# Patient Record
Sex: Female | Born: 2000
Health system: Southern US, Community
[De-identification: ages and names within clinical notes are randomized; demographics above are authoritative.]

## PROBLEM LIST (undated history)

## (undated) DIAGNOSIS — F320 Major depressive disorder, single episode, mild: Secondary | ICD-10-CM

## (undated) DIAGNOSIS — L0292 Furuncle, unspecified: Secondary | ICD-10-CM

## (undated) DIAGNOSIS — J45909 Unspecified asthma, uncomplicated: Secondary | ICD-10-CM

## (undated) DIAGNOSIS — E049 Nontoxic goiter, unspecified: Secondary | ICD-10-CM

## (undated) DIAGNOSIS — T7422XA Child sexual abuse, confirmed, initial encounter: Secondary | ICD-10-CM

## (undated) DIAGNOSIS — E109 Type 1 diabetes mellitus without complications: Secondary | ICD-10-CM

## (undated) DIAGNOSIS — F909 Attention-deficit hyperactivity disorder, unspecified type: Secondary | ICD-10-CM

## (undated) DIAGNOSIS — T7840XA Allergy, unspecified, initial encounter: Secondary | ICD-10-CM

## (undated) DIAGNOSIS — E1010 Type 1 diabetes mellitus with ketoacidosis without coma: Secondary | ICD-10-CM

## (undated) DIAGNOSIS — E1110 Type 2 diabetes mellitus with ketoacidosis without coma: Secondary | ICD-10-CM

## (undated) HISTORY — DX: Type 1 diabetes mellitus with ketoacidosis without coma: E10.10

## (undated) HISTORY — DX: Child sexual abuse, confirmed, initial encounter: T74.22XA

## (undated) HISTORY — DX: Type 1 diabetes mellitus without complications: E10.9

## (undated) HISTORY — DX: Major depressive disorder, single episode, mild: F32.0

## (undated) NOTE — *Deleted (*Deleted)
Thank you for coming to see me today. It was a pleasure. Today we talked about:   Continue to follow up with Dr Fransico Michael for your Diabetes  Please follow-up with PCP as needed  If you have any questions or concerns, please do not hesitate to call the office at [phone].  Best,  Dawn Allan, MD Family Medicine Residency   Outpatient Mental Health Providers (No Insurance required or Self Pay)  [facility]  [address] [place], [place] [phone] Crisis [phone]  [facility]) can see uninsured folks for outpatient therapy [email or web address] [address] [place], [place] [address] [phone]  [facility]    Walk-in Mon-Fri, 8am-3pm [email or web address] [email or web address] [address] [place], [place]  [phone]   [address]  [place] [phone] [facility] for psych med management, there may be a wait- if [facility] is working with clients for OPT, they will coordinate with [facility] for psych  [facility]   Walk-in-Clinic: Monday- Friday 9:00 AM - 4:00 PM [address]   [place], [place] [phone]  [facility] [place] ([employer]) walk in M-F 8am-12pm and  1pm-3pm [place]- [address]     [phone]  [employer] [address]  Phone: [phone]  [employer] (Mental Health and substance challenges) [address]   [place] [phone]    [email or web address]    [facility]  [facility] -[address], [place]     Phone:  [phone] [place]-  [address]  [phone]   [facility]  [address] [place]  [phone] [email or web address]   [facility] ( virtual or zoom therapy) [email or web address]  [address] [place]  [phone]    [facility] [phone]  grief counseling,  dementia and caregiver support    [facility] Walk-in MWF 12:30 to 3:00     [address] [place] [address]  [phone]  [email or web address] call to schedule an appointment    [facility] Classes ,Support group, Peer support services, [address], [place], [place] [address] [phone]  PhotoSolver.[email or web address]           [employer] on Mental Illness (NAMI) [place]- Wellness classes, Support groups        [address], [place], [place] [address] [phone]   [email or web address]   [facility]  (Psycho-social Rehabilitation clubhouse, Individual and group therapy) [address] [place], [place] [address]   [phone]  24- Hour Availability:  [facility] [phone] or [phone] * [facility] [employer] (Domestic Violence, Rape, etc. )9527488881 Vesta Mixer [phone] or [phone] * [facility] [phone] only) [phone] (after hours) *[facility] [phone] *[place] National Suicide Hotline [phone] Len Childs)

---
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
## 2021-01-03 ENCOUNTER — Other Ambulatory Visit: Payer: Self-pay

## 2021-01-03 ENCOUNTER — Inpatient Hospital Stay (HOSPITAL_COMMUNITY)
Admission: EM | Admit: 2021-01-03 | Discharge: 2021-01-05 | DRG: 637 | Disposition: A | Discharge status: Home / Self Care | Payer: Medicaid Other | Attending: Family Medicine | Admitting: Family Medicine

## 2021-01-03 ENCOUNTER — Encounter (HOSPITAL_COMMUNITY): Payer: Self-pay | Admitting: Emergency Medicine

## 2021-01-03 DIAGNOSIS — N179 Acute kidney failure, unspecified: Secondary | ICD-10-CM | POA: Diagnosis not present

## 2021-01-03 DIAGNOSIS — R680 Hypothermia, not associated with low environmental temperature: Secondary | ICD-10-CM | POA: Diagnosis present

## 2021-01-03 DIAGNOSIS — T383X6A Underdosing of insulin and oral hypoglycemic [antidiabetic] drugs, initial encounter: Secondary | ICD-10-CM | POA: Diagnosis present

## 2021-01-03 DIAGNOSIS — E861 Hypovolemia: Secondary | ICD-10-CM | POA: Diagnosis present

## 2021-01-03 DIAGNOSIS — Z9114 Patient's other noncompliance with medication regimen: Secondary | ICD-10-CM

## 2021-01-03 DIAGNOSIS — E1010 Type 1 diabetes mellitus with ketoacidosis without coma: Secondary | ICD-10-CM | POA: Diagnosis not present

## 2021-01-03 DIAGNOSIS — Z91128 Patient's intentional underdosing of medication regimen for other reason: Secondary | ICD-10-CM

## 2021-01-03 DIAGNOSIS — Z20822 Contact with and (suspected) exposure to covid-19: Secondary | ICD-10-CM | POA: Diagnosis present

## 2021-01-03 DIAGNOSIS — K219 Gastro-esophageal reflux disease without esophagitis: Secondary | ICD-10-CM | POA: Diagnosis present

## 2021-01-03 DIAGNOSIS — E1110 Type 2 diabetes mellitus with ketoacidosis without coma: Secondary | ICD-10-CM | POA: Diagnosis present

## 2021-01-03 DIAGNOSIS — F329 Major depressive disorder, single episode, unspecified: Secondary | ICD-10-CM | POA: Diagnosis present

## 2021-01-03 DIAGNOSIS — Z825 Family history of asthma and other chronic lower respiratory diseases: Secondary | ICD-10-CM

## 2021-01-03 DIAGNOSIS — R112 Nausea with vomiting, unspecified: Secondary | ICD-10-CM | POA: Diagnosis present

## 2021-01-03 DIAGNOSIS — G9341 Metabolic encephalopathy: Secondary | ICD-10-CM | POA: Diagnosis present

## 2021-01-03 DIAGNOSIS — Z833 Family history of diabetes mellitus: Secondary | ICD-10-CM

## 2021-01-03 DIAGNOSIS — Z794 Long term (current) use of insulin: Secondary | ICD-10-CM

## 2021-01-03 DIAGNOSIS — Z91013 Allergy to seafood: Secondary | ICD-10-CM

## 2021-01-03 DIAGNOSIS — F909 Attention-deficit hyperactivity disorder, unspecified type: Secondary | ICD-10-CM | POA: Diagnosis present

## 2021-01-03 DIAGNOSIS — Z91199 Patient's noncompliance with other medical treatment and regimen due to unspecified reason: Secondary | ICD-10-CM

## 2021-01-03 DIAGNOSIS — J45909 Unspecified asthma, uncomplicated: Secondary | ICD-10-CM | POA: Diagnosis not present

## 2021-01-03 DIAGNOSIS — E860 Dehydration: Secondary | ICD-10-CM | POA: Diagnosis present

## 2021-01-03 DIAGNOSIS — Z79899 Other long term (current) drug therapy: Secondary | ICD-10-CM

## 2021-01-03 LAB — BASIC METABOLIC PANEL
Anion gap: 19 — ABNORMAL HIGH (ref 5–15)
Anion gap: 16 — ABNORMAL HIGH (ref 5–15)
BUN: 21 mg/dL — ABNORMAL HIGH (ref 6–20)
BUN: 21 mg/dL — ABNORMAL HIGH (ref 6–20)
BUN: 17 mg/dL (ref 6–20)
BUN: 14 mg/dL (ref 6–20)
BUN: 9 mg/dL (ref 6–20)
CO2: <7 mmol/L — ABNORMAL LOW (ref 22–32)
CO2: <7 mmol/L — ABNORMAL LOW (ref 22–32)
CO2: <7 mmol/L — ABNORMAL LOW (ref 22–32)
CO2: 7 mmol/L — ABNORMAL LOW (ref 22–32)
CO2: 10 mmol/L — ABNORMAL LOW (ref 22–32)
Calcium: 9.6 mg/dL (ref 8.9–10.3)
Calcium: 8.5 mg/dL — ABNORMAL LOW (ref 8.9–10.3)
Calcium: 9 mg/dL (ref 8.9–10.3)
Calcium: 8.9 mg/dL (ref 8.9–10.3)
Calcium: 8.7 mg/dL — ABNORMAL LOW (ref 8.9–10.3)
Chloride: 102 mmol/L (ref 98–111)
Chloride: 110 mmol/L (ref 98–111)
Chloride: 115 mmol/L — ABNORMAL HIGH (ref 98–111)
Chloride: 117 mmol/L — ABNORMAL HIGH (ref 98–111)
Chloride: 114 mmol/L — ABNORMAL HIGH (ref 98–111)
Creatinine, Ser: 1.55 mg/dL — ABNORMAL HIGH (ref 0.44–1.00)
Creatinine, Ser: 1.51 mg/dL — ABNORMAL HIGH (ref 0.44–1.00)
Creatinine, Ser: 1.44 mg/dL — ABNORMAL HIGH (ref 0.44–1.00)
Creatinine, Ser: 1.28 mg/dL — ABNORMAL HIGH (ref 0.44–1.00)
Creatinine, Ser: 1.06 mg/dL — ABNORMAL HIGH (ref 0.44–1.00)
GFR, Estimated: 49 mL/min — ABNORMAL LOW (ref >60)
GFR, Estimated: 51 mL/min — ABNORMAL LOW (ref >60)
GFR, Estimated: 54 mL/min — ABNORMAL LOW (ref >60)
GFR, Estimated: >60 mL/min (ref >60)
GFR, Estimated: >60 mL/min (ref >60)
Glucose, Bld: 708 mg/dL (ref 70–99)
Glucose, Bld: 436 mg/dL — ABNORMAL HIGH (ref 70–99)
Glucose, Bld: 178 mg/dL — ABNORMAL HIGH (ref 70–99)
Glucose, Bld: 134 mg/dL — ABNORMAL HIGH (ref 70–99)
Glucose, Bld: 168 mg/dL — ABNORMAL HIGH (ref 70–99)
Potassium: 5.1 mmol/L (ref 3.5–5.1)
Potassium: 5.9 mmol/L — ABNORMAL HIGH (ref 3.5–5.1)
Potassium: 4.4 mmol/L (ref 3.5–5.1)
Potassium: 4.2 mmol/L (ref 3.5–5.1)
Potassium: 3.6 mmol/L (ref 3.5–5.1)
Sodium: 136 mmol/L (ref 135–145)
Sodium: 138 mmol/L (ref 135–145)
Sodium: 143 mmol/L (ref 135–145)
Sodium: 143 mmol/L (ref 135–145)
Sodium: 140 mmol/L (ref 135–145)

## 2021-01-03 LAB — URINALYSIS, ROUTINE W REFLEX MICROSCOPIC
Bilirubin Urine: NEGATIVE
Glucose, UA: >=500 mg/dL — AB
Hgb urine dipstick: NEGATIVE
Ketones, ur: 80 mg/dL — AB
Leukocytes,Ua: NEGATIVE
Nitrite: NEGATIVE
Protein, ur: NEGATIVE mg/dL
Specific Gravity, Urine: 1.016 (ref 1.005–1.030)
pH: 5 (ref 5.0–8.0)

## 2021-01-03 LAB — I-STAT VENOUS BLOOD GAS, ED
Acid-base deficit: 29 mmol/L — ABNORMAL HIGH (ref 0.0–2.0)
Bicarbonate: 3.8 mmol/L — ABNORMAL LOW (ref 20.0–28.0)
Calcium, Ion: 1.29 mmol/L (ref 1.15–1.40)
HCT: 45 % (ref 36.0–46.0)
Hemoglobin: 15.3 g/dL — ABNORMAL HIGH (ref 12.0–15.0)
O2 Saturation: 57 %
Patient temperature: 37
Potassium: 5 mmol/L (ref 3.5–5.1)
Sodium: 137 mmol/L (ref 135–145)
TCO2: <5 mmol/L — ABNORMAL LOW (ref 22–32)
pCO2, Ven: 20.5 mmHg — ABNORMAL LOW (ref 44.0–60.0)
pH, Ven: 6.879 — CL (ref 7.250–7.430)
pO2, Ven: 50 mmHg — ABNORMAL HIGH (ref 32.0–45.0)

## 2021-01-03 LAB — RAPID URINE DRUG SCREEN, HOSP PERFORMED
Amphetamines: NOT DETECTED
Barbiturates: NOT DETECTED
Benzodiazepines: NOT DETECTED
Cocaine: NOT DETECTED
Opiates: NOT DETECTED
Tetrahydrocannabinol: NOT DETECTED

## 2021-01-03 LAB — CBG MONITORING, ED
Glucose-Capillary: >600 mg/dL (ref 70–99)
Glucose-Capillary: 512 mg/dL (ref 70–99)
Glucose-Capillary: 462 mg/dL — ABNORMAL HIGH (ref 70–99)
Glucose-Capillary: 366 mg/dL — ABNORMAL HIGH (ref 70–99)
Glucose-Capillary: 236 mg/dL — ABNORMAL HIGH (ref 70–99)
Glucose-Capillary: 191 mg/dL — ABNORMAL HIGH (ref 70–99)
Glucose-Capillary: 384 mg/dL — ABNORMAL HIGH (ref 70–99)
Glucose-Capillary: 84 mg/dL (ref 70–99)
Glucose-Capillary: 118 mg/dL — ABNORMAL HIGH (ref 70–99)
Glucose-Capillary: 106 mg/dL — ABNORMAL HIGH (ref 70–99)
Glucose-Capillary: 173 mg/dL — ABNORMAL HIGH (ref 70–99)
Glucose-Capillary: 193 mg/dL — ABNORMAL HIGH (ref 70–99)
Glucose-Capillary: 191 mg/dL — ABNORMAL HIGH (ref 70–99)
Glucose-Capillary: 189 mg/dL — ABNORMAL HIGH (ref 70–99)
Glucose-Capillary: 174 mg/dL — ABNORMAL HIGH (ref 70–99)
Glucose-Capillary: 244 mg/dL — ABNORMAL HIGH (ref 70–99)
Glucose-Capillary: 252 mg/dL — ABNORMAL HIGH (ref 70–99)
Glucose-Capillary: 242 mg/dL — ABNORMAL HIGH (ref 70–99)
Glucose-Capillary: 177 mg/dL — ABNORMAL HIGH (ref 70–99)
Glucose-Capillary: 164 mg/dL — ABNORMAL HIGH (ref 70–99)
Glucose-Capillary: 144 mg/dL — ABNORMAL HIGH (ref 70–99)

## 2021-01-03 LAB — CBC WITH DIFFERENTIAL/PLATELET
Abs Immature Granulocytes: 0.13 10*3/uL — ABNORMAL HIGH (ref 0.00–0.07)
Basophils Absolute: 0.1 10*3/uL (ref 0.0–0.1)
Basophils Relative: 1 %
Eosinophils Absolute: 0 10*3/uL (ref 0.0–0.5)
Eosinophils Relative: 0 %
HCT: 46.5 % — ABNORMAL HIGH (ref 36.0–46.0)
Hemoglobin: 13.1 g/dL (ref 12.0–15.0)
Immature Granulocytes: 1 %
Lymphocytes Relative: 22 %
Lymphs Abs: 3.2 10*3/uL (ref 0.7–4.0)
MCH: 28.6 pg (ref 26.0–34.0)
MCHC: 28.2 g/dL — ABNORMAL LOW (ref 30.0–36.0)
MCV: 101.5 fL — ABNORMAL HIGH (ref 80.0–100.0)
Monocytes Absolute: 0.6 10*3/uL (ref 0.1–1.0)
Monocytes Relative: 4 %
Neutro Abs: 10.4 10*3/uL — ABNORMAL HIGH (ref 1.7–7.7)
Neutrophils Relative %: 72 %
Platelets: 274 10*3/uL (ref 150–400)
RBC: 4.58 MIL/uL (ref 3.87–5.11)
RDW: 13.7 % (ref 11.5–15.5)
WBC: 14.3 10*3/uL — ABNORMAL HIGH (ref 4.0–10.5)
nRBC: 0 % (ref 0.0–0.2)

## 2021-01-03 LAB — RESP PANEL BY RT-PCR (FLU A&B, COVID) ARPGX2
Influenza A by PCR: NEGATIVE
Influenza B by PCR: NEGATIVE
SARS Coronavirus 2 by RT PCR: NEGATIVE

## 2021-01-03 LAB — ETHANOL: Alcohol, Ethyl (B): <10 mg/dL (ref <10)

## 2021-01-03 LAB — BETA-HYDROXYBUTYRIC ACID
Beta-Hydroxybutyric Acid: >8 mmol/L — ABNORMAL HIGH (ref 0.05–0.27)
Beta-Hydroxybutyric Acid: >8 mmol/L — ABNORMAL HIGH (ref 0.05–0.27)
Beta-Hydroxybutyric Acid: >8 mmol/L — ABNORMAL HIGH (ref 0.05–0.27)

## 2021-01-03 LAB — BLOOD GAS, ARTERIAL
Acid-base deficit: 27.5 mmol/L — ABNORMAL HIGH (ref 0.0–2.0)
Allens test (pass/fail): POSITIVE — AB
Bicarbonate: 2 mmol/L — ABNORMAL LOW (ref 20.0–28.0)
FIO2: 21
O2 Saturation: 97.7 %
Patient temperature: 34.4
pCO2 arterial: <19 mmHg — CL (ref 32.0–48.0)
pH, Arterial: 7.075 — CL (ref 7.350–7.450)
pO2, Arterial: <31 mmHg — CL (ref 83.0–108.0)

## 2021-01-03 LAB — AMMONIA: Ammonia: 69 umol/L — ABNORMAL HIGH (ref 9–35)

## 2021-01-03 LAB — MAGNESIUM: Magnesium: 2.6 mg/dL — ABNORMAL HIGH (ref 1.7–2.4)

## 2021-01-03 LAB — I-STAT BETA HCG BLOOD, ED (MC, WL, AP ONLY): I-stat hCG, quantitative: <5 m[IU]/mL (ref <5)

## 2021-01-03 MED ORDER — SODIUM BICARBONATE 8.4 % IV SOLN
50.0000 meq | Freq: Once | INTRAVENOUS | Status: AC
  Administered 2021-01-03: 50 meq via INTRAVENOUS
  Filled 2021-01-03: qty 50

## 2021-01-03 MED ORDER — SODIUM CHLORIDE 0.9 % IV BOLUS
1000.0000 mL | Freq: Once | INTRAVENOUS | Status: AC
Start: 2021-01-03 — End: 2021-01-03
  Administered 2021-01-03: 1000 mL via INTRAVENOUS

## 2021-01-03 MED ORDER — ALBUTEROL SULFATE (2.5 MG/3ML) 0.083% IN NEBU: 2.5000 mg | INHALATION_SOLUTION | RESPIRATORY_TRACT | Status: DC | PRN

## 2021-01-03 MED ORDER — DEXTROSE 50 % IV SOLN: 0.0000 mL | INTRAVENOUS | Status: DC | PRN

## 2021-01-03 MED ORDER — LACTATED RINGERS IV BOLUS
1000.0000 mL | Freq: Once | INTRAVENOUS | Status: AC
  Administered 2021-01-03: 1000 mL via INTRAVENOUS

## 2021-01-03 MED ORDER — INSULIN REGULAR(HUMAN) IN NACL 100-0.9 UT/100ML-% IV SOLN
INTRAVENOUS | Status: DC
  Administered 2021-01-03: 5.5 [IU]/h via INTRAVENOUS

## 2021-01-03 MED ORDER — LACTATED RINGERS IV SOLN: INTRAVENOUS | Status: DC

## 2021-01-03 MED ORDER — DEXTROSE IN LACTATED RINGERS 5 % IV SOLN: INTRAVENOUS | Status: DC

## 2021-01-03 MED ORDER — ENOXAPARIN SODIUM 30 MG/0.3ML IJ SOSY
30.0000 mg | PREFILLED_SYRINGE | INTRAMUSCULAR | Status: DC
  Filled 2021-01-03: qty 0.3

## 2021-01-03 MED ORDER — INSULIN REGULAR(HUMAN) IN NACL 100-0.9 UT/100ML-% IV SOLN
INTRAVENOUS | Status: DC
  Administered 2021-01-03: 7 [IU]/h via INTRAVENOUS
  Filled 2021-01-03: qty 100

## 2021-01-03 MED ORDER — ACETAMINOPHEN 325 MG PO TABS
650.0000 mg | ORAL_TABLET | Freq: Four times a day (QID) | ORAL | Status: DC | PRN
  Administered 2021-01-03: 650 mg via ORAL
  Filled 2021-01-03: qty 2

## 2021-01-03 NOTE — ED Notes (Signed)
Pt requesting tylenol for menstrual cramp pain. This RN paged provider per pt's request

## 2021-01-03 NOTE — ED Notes (Signed)
Pt c/o feeling cold. New warm blankets provided to pt along with heat packs for menstrual cramping. Sandwich bag and ice water also given to pt.

## 2021-01-03 NOTE — ED Provider Notes (Signed)
I provided a substantive portion of the care of this patient.  I personally performed the entirety of the history, exam, and medical decision making for this encounter.  Noncompliant. Here with hyperglycemia, tachypnea. Appears significantly ill. Likely DKA. Probably from noncompliance.     Physical Exam  BP (!) 148/110 (BP Location: Left Arm)    Resp (!) 24   Physical Exam Vitals and nursing note reviewed.  HENT:     Head: Normocephalic and atraumatic.     Mouth/Throat:     Mouth: Mucous membranes are moist.     Pharynx: Oropharynx is clear.  Eyes:     Pupils: Pupils are equal, round, and reactive to light.  Cardiovascular:     Rate and Rhythm: Tachycardia present.  Pulmonary:     Effort: Tachypnea present.  Abdominal:     General: There is no distension.     Tenderness: There is no abdominal tenderness.  Musculoskeletal:        General: No swelling or tenderness. Normal range of motion.  Skin:    General: Skin is warm and dry.  Neurological:     General: No focal deficit present.    ED Course/Procedures     .Critical Care Performed by: Marily Memos, MD Authorized by: Marily Memos, MD   Critical care provider statement:    Critical care time (minutes):  32   Critical care time was exclusive of:  Separately billable procedures and treating other patients and teaching time   Critical care was necessary to treat or prevent imminent or life-threatening deterioration of the following conditions:  Shock, circulatory failure, cardiac failure and metabolic crisis   Critical care was time spent personally by me on the following activities:  Development of treatment plan with patient or surrogate, evaluation of patient's response to treatment, examination of patient, obtaining history from patient or surrogate, review of old charts, re-evaluation of patient's condition, pulse oximetry, ordering and performing treatments and interventions and ordering and review of laboratory  studies .1-3 Lead EKG Interpretation Performed by: Marily Memos, MD Authorized by: Marily Memos, MD     Interpretation: normal     ECG rate:  105   ECG rate assessment: tachycardic     Rhythm: sinus rhythm     Ectopy: none     Conduction: normal   Angiocath insertion  Date/Time: 01/03/2021 3:28 AM Performed by: Marily Memos, MD Authorized by: Marily Memos, MD  Consent: The procedure was performed in an emergent situation. Consent given by: patient Patient understanding: patient states understanding of the procedure being performed Patient consent: the patient's understanding of the procedure matches consent given Procedure consent: procedure consent matches procedure scheduled Relevant documents: relevant documents present and verified Required items: required blood products, implants, devices, and special equipment available Patient identity confirmed: arm band Time out: Immediately prior to procedure a "time out" was called to verify the correct patient, procedure, equipment, support staff and site/side marked as required. Preparation: Patient was prepped and draped in the usual sterile fashion. Local anesthesia used: no  Anesthesia: Local anesthesia used: no  Sedation: Patient sedated: no  Patient tolerance: patient tolerated the procedure well with no immediate complications    MDM   Here with what appears to be severe DKA. IV started. Fluids started. Will start bicarb. Will likely need ICU.        Tiawanna Luchsinger, Barbara Cower, MD 01/03/21 [phone]

## 2021-01-03 NOTE — ED Notes (Signed)
Temp 93.9 F. EDP notified and Bair Hugger applied

## 2021-01-03 NOTE — Progress Notes (Signed)
FPTS Brief Progress Note  S: Patient was laying comfortably in bed.  Aside from feeling generally fatigued patient says she has no complaints. She reports her breathing is much improved. She has abdominal cramps which she attributes to menses.   O: BP 119/87    Pulse (!) 102    Temp 98.4 F (36.9 C) (Oral)    Resp 16    SpO2 100%   General: Pleasant, NAD Cardiovascular: RRR, no murmurs Respiratory: CTAB, normal work of breathing on room air Abdomen: Nondistended, no tenderness, bowel sounds present Neuro: Alert, oriented x3   A/P: DKA 2/2 for medication compliance   T1DM Last CBG is 177. On exam she is alert and has normal breathing.   Patient is eating. Currently on endo tool with plan to transition to insulin once anion Gap is closed.  Continue monitoring CBG per Endo tool  - Orders reviewed. Labs for AM ordered, which was adjusted as needed.  -Continue plan per day team  Jerre Simon, MD 01/03/2021, 9:56 PM PGY-1, [facility] Night Resident  Please page [phone] with questions.
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
## 2021-01-03 NOTE — ED Notes (Signed)
Endotool recommends transition order for insulin. Asked Network engineer to page rounding team at this time.

## 2021-01-03 NOTE — ED Provider Notes (Addendum)
° [facility] °Provider Note ° ° °CSN: [id number] °Arrival date & time: 01/03/21  0223 ° °  ° °History ° °No chief complaint on file. ° ° °Dawn [name] is a 21 y.o. female. ° °Patient here with chief complaint of hyperglycemia and confusion.  Level 5 caveat applies secondary to acuity of condition and altered mental status. ° °Reportedly, patient is type I diabetic, unknown last insulin dose. ° °The history is provided by the patient. No language interpreter was used.  ° °  ° °Home Medications °Prior to Admission medications   °Medication Sig Start Date End Date Taking? Authorizing Provider  °Accu-Chek Softclix Lancets lancets Please use to check blood sugar up to 6 times/day. 07/01/19   Walsh, Tanya, MD  °albuterol (PROVENTIL) (2.5 MG/3ML) 0.083% nebulizer solution TAKE 3 MLS BY NEBULIZER EVERY 6 HOURS AS NEEDED 11/30/20   Walsh, Tanya, MD  °Blood Glucose Monitoring Suppl (ACCU-CHEK GUIDE) w/Device KIT 1 kit by Does not apply route daily as needed. 07/01/19   Walsh, Tanya, MD  °Continuous Blood Gluc Sensor (FREESTYLE LIBRE 2 SENSOR) MISC Change sensor every 14 days. 11/07/19   Brennan, Michael J, MD  °Continuous Blood Gluc Transmit (DEXCOM G6 TRANSMITTER) MISC See admin instructions. 07/22/20   [provider]  °glucagon (GLUCAGON EMERGENCY) 1 MG injection Inject 1 mg IM for severe hypoglycemia °Patient taking differently: Inject 1 mg into the muscle once as needed (hypoglycemia). 04/03/18   Beasley, Sp[place]er, NP  °glucose blood (ACCU-CHEK GUIDE) test strip Use to check glucose 6x daily 08/17/20   Neal, Sara L, MD  °insulin glargine (LANTUS) 100 UNIT/ML injection Inject 0.28 mLs (28 Units total) into the skin at bedtime. 11/28/20   Brimage, Vondra, DO  °insulin lispro (HUMALOG KWIKPEN) 100 UNIT/ML KwikPen Inject 10 Units into the skin with breakfast, with lunch, and with evening meal. 07/13/20 01/09/21  Lynn, [name], MD  °Insulin Pen Needle (BD PEN NEEDLE NANO 2ND GEN) 32G X 4  MM MISC USE TO INJECT INSULIN 6 TIMES DAILY 08/26/19   Walsh, Tanya, MD  °Insulin Syringe-Needle U-100 (INSULIN SYRINGE .5CC/30GX1/2") 30G X 1/2" 0.5 ML MISC Use to draw up Novolog three times daily. 06/08/20   Espinoza, Alejandra, DO  °Insulin Syringe-Needle U-100 (INSULIN SYRINGE 1CC/30GX1/2") 30G X 1/2" 1 ML MISC Use to draw up Lantus daily. 06/08/20   Espinoza, Alejandra, DO  °PROAIR HFA 108 (90 Base) MCG/ACT inhaler Inhale 2 puffs into the lungs every 6 (six) hours as needed for wheezing or shortness of breath. 08/17/20   [provider]  °sertraline (ZOLOFT) 50 MG tablet Take 1.5 tablets (75 mg total) by mouth daily. 07/01/20   Walsh, Tanya, MD  °SUMAtriptan (IMITREX) 25 MG tablet Take 0.5 tablets (12.5 mg total) by mouth every 2 (two) hours as needed for migraine. May repeat in 2 hours if headache persists or recurs. 07/01/20   Walsh, Tanya, MD  °   ° °Allergies    °Shellfish allergy   ° °Review of Systems   °Review of Systems ° °Physical Exam °Updated Vital Signs °BP (!) 148/110 (BP Location: Left Arm)    Resp (!) 24  °Physical Exam °Vitals and nursing note reviewed.  °Constitutional:   °   General: She is not in acute distress. °   Appearance: She is well-developed.  °HENT:  °   Head: Normocephalic and atraumatic.  °Eyes:  °   Conjunctiva/sclera: Conjunctivae normal.  °Pulmonary:  °   Effort: No respiratory distress.  °   Comments:   Comments: Tachypnea Abdominal:     General: There is no distension.  Musculoskeletal:     Cervical back: Neck supple.     Comments: Moves all extremities  Skin:    General: Skin is warm and dry.  Neurological:     Comments: GCS 10  Psychiatric:        Mood and Affect: Mood normal.        Behavior: Behavior normal.    ED Results / Procedures / Treatments   Labs (all labs ordered are listed, but only abnormal results are displayed) Labs Reviewed  CBC WITH DIFFERENTIAL/PLATELET  BASIC METABOLIC PANEL  I-STAT VENOUS BLOOD GAS, ED  I-STAT BETA HCG BLOOD, ED ([facility], [facility], [facility]  ONLY)    EKG None  Radiology No results found.  Procedures .Critical Care Performed by: Montine Circle, PA-C Authorized by: Montine Circle, PA-C   Critical care provider statement:    Critical care time (minutes):  50   Critical care was necessary to treat or prevent imminent or life-threatening deterioration of the following conditions:  Metabolic crisis   Critical care was time spent personally by me on the following activities:  Development of treatment plan with patient or surrogate, discussions with consultants, evaluation of patient's response to treatment, examination of patient, ordering and review of laboratory studies, ordering and review of radiographic studies, ordering and performing treatments and interventions, pulse oximetry, re-evaluation of patient's condition and review of old charts    Medications Ordered in ED Medications - No data to display  ED Course/ Medical Decision Making/ A&P                           Medical Decision Making  Patient here with severe acute exacerbation of chronic illness. Hx of Type 1 DM, prior DKA, and medication non-compliance.  She presents confused and hyperglycemic with concerns for DKA and coma.  Chart review from outside family medicine clinic notes history of insulin noncompliance.  Patient put on cardiac monitor, rhythm strip reviewed by me is sinus tachycardia.  I ordered venous blood gas to assess pH, venous pH was 6.87.  Potassium is 5.  We will start lactated Ringer's for dehydration.  Glucose is greater than 600, will start insulin infusion to treat hyperglycemia.  Potassium being 5, will hold off on any runs of potassium.  Patient given warming blanket/bear hugger for hypothermia.  I discussed the case with Dr. Carson Myrtle, from intensivist service, who will come to evaluate the patient.  Patient will need admission to ICU for this illness.    4:59 AM Patient seen by ICU, recommend medicine admit.         Final Clinical Impression(s) / ED Diagnoses Final diagnoses:  Diabetic ketoacidosis without coma associated with type 1 diabetes mellitus [facility])    Rx / DC Orders ED Discharge Orders     None         Montine Circle, PA-C 01/03/21 0359    Montine Circle, PA-C 01/03/21 0500    Mesner, Corene Cornea, MD 01/03/21 [phone]

## 2021-01-03 NOTE — ED Triage Notes (Signed)
Pt BIB [facility], type 1 diabetic, unknown last time pt had insulin. Not answering questions or following commands at this time.
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
## 2021-01-03 NOTE — Progress Notes (Signed)
Provided MD with ABG results and explained to him that the lab will be running the sample once again to gain the complete values.

## 2021-01-03 NOTE — ED Notes (Signed)
Barehugger discontinued and warm blankets placed on pt at this time

## 2021-01-04 DIAGNOSIS — E1110 Type 2 diabetes mellitus with ketoacidosis without coma: Secondary | ICD-10-CM | POA: Diagnosis not present

## 2021-01-04 DIAGNOSIS — Z825 Family history of asthma and other chronic lower respiratory diseases: Secondary | ICD-10-CM | POA: Diagnosis not present

## 2021-01-04 DIAGNOSIS — R680 Hypothermia, not associated with low environmental temperature: Secondary | ICD-10-CM | POA: Diagnosis present

## 2021-01-04 DIAGNOSIS — K219 Gastro-esophageal reflux disease without esophagitis: Secondary | ICD-10-CM | POA: Diagnosis present

## 2021-01-04 DIAGNOSIS — F909 Attention-deficit hyperactivity disorder, unspecified type: Secondary | ICD-10-CM | POA: Diagnosis present

## 2021-01-04 DIAGNOSIS — Z91199 Patient's noncompliance with other medical treatment and regimen due to unspecified reason: Secondary | ICD-10-CM | POA: Diagnosis not present

## 2021-01-04 DIAGNOSIS — Z91128 Patient's intentional underdosing of medication regimen for other reason: Secondary | ICD-10-CM | POA: Diagnosis not present

## 2021-01-04 DIAGNOSIS — Z79899 Other long term (current) drug therapy: Secondary | ICD-10-CM | POA: Diagnosis not present

## 2021-01-04 DIAGNOSIS — E860 Dehydration: Secondary | ICD-10-CM | POA: Diagnosis present

## 2021-01-04 DIAGNOSIS — N179 Acute kidney failure, unspecified: Secondary | ICD-10-CM | POA: Diagnosis present

## 2021-01-04 DIAGNOSIS — Z91013 Allergy to seafood: Secondary | ICD-10-CM | POA: Diagnosis not present

## 2021-01-04 DIAGNOSIS — J45909 Unspecified asthma, uncomplicated: Secondary | ICD-10-CM | POA: Diagnosis present

## 2021-01-04 DIAGNOSIS — Z20822 Contact with and (suspected) exposure to covid-19: Secondary | ICD-10-CM | POA: Diagnosis present

## 2021-01-04 DIAGNOSIS — Z794 Long term (current) use of insulin: Secondary | ICD-10-CM | POA: Diagnosis not present

## 2021-01-04 DIAGNOSIS — G9341 Metabolic encephalopathy: Secondary | ICD-10-CM | POA: Diagnosis present

## 2021-01-04 DIAGNOSIS — Z833 Family history of diabetes mellitus: Secondary | ICD-10-CM | POA: Diagnosis not present

## 2021-01-04 DIAGNOSIS — F329 Major depressive disorder, single episode, unspecified: Secondary | ICD-10-CM | POA: Diagnosis present

## 2021-01-04 DIAGNOSIS — E861 Hypovolemia: Secondary | ICD-10-CM | POA: Diagnosis present

## 2021-01-04 DIAGNOSIS — T383X6A Underdosing of insulin and oral hypoglycemic [antidiabetic] drugs, initial encounter: Secondary | ICD-10-CM | POA: Diagnosis present

## 2021-01-04 DIAGNOSIS — E1010 Type 1 diabetes mellitus with ketoacidosis without coma: Secondary | ICD-10-CM | POA: Diagnosis present

## 2021-01-04 DIAGNOSIS — Z9114 Patient's other noncompliance with medication regimen: Secondary | ICD-10-CM | POA: Diagnosis not present

## 2021-01-04 LAB — BASIC METABOLIC PANEL
Anion gap: 13 (ref 5–15)
Anion gap: 13 (ref 5–15)
Anion gap: 16 — ABNORMAL HIGH (ref 5–15)
Anion gap: 8 (ref 5–15)
BUN: 6 mg/dL (ref 6–20)
BUN: 6 mg/dL (ref 6–20)
BUN: 7 mg/dL (ref 6–20)
BUN: <5 mg/dL — ABNORMAL LOW (ref 6–20)
CO2: 11 mmol/L — ABNORMAL LOW (ref 22–32)
CO2: 13 mmol/L — ABNORMAL LOW (ref 22–32)
CO2: 15 mmol/L — ABNORMAL LOW (ref 22–32)
CO2: 16 mmol/L — ABNORMAL LOW (ref 22–32)
Calcium: 8.5 mg/dL — ABNORMAL LOW (ref 8.9–10.3)
Calcium: 8.5 mg/dL — ABNORMAL LOW (ref 8.9–10.3)
Calcium: 8.8 mg/dL — ABNORMAL LOW (ref 8.9–10.3)
Calcium: 9.1 mg/dL (ref 8.9–10.3)
Chloride: 106 mmol/L (ref 98–111)
Chloride: 107 mmol/L (ref 98–111)
Chloride: 109 mmol/L (ref 98–111)
Chloride: 112 mmol/L — ABNORMAL HIGH (ref 98–111)
Creatinine, Ser: 0.68 mg/dL (ref 0.44–1.00)
Creatinine, Ser: 0.82 mg/dL (ref 0.44–1.00)
Creatinine, Ser: 0.83 mg/dL (ref 0.44–1.00)
Creatinine, Ser: 0.86 mg/dL (ref 0.44–1.00)
GFR, Estimated: >60 mL/min (ref >60)
GFR, Estimated: >60 mL/min (ref >60)
GFR, Estimated: >60 mL/min (ref >60)
GFR, Estimated: >60 mL/min (ref >60)
Glucose, Bld: 135 mg/dL — ABNORMAL HIGH (ref 70–99)
Glucose, Bld: 142 mg/dL — ABNORMAL HIGH (ref 70–99)
Glucose, Bld: 148 mg/dL — ABNORMAL HIGH (ref 70–99)
Glucose, Bld: 193 mg/dL — ABNORMAL HIGH (ref 70–99)
Potassium: 3.4 mmol/L — ABNORMAL LOW (ref 3.5–5.1)
Potassium: 3.5 mmol/L (ref 3.5–5.1)
Potassium: 3.5 mmol/L (ref 3.5–5.1)
Potassium: 4.1 mmol/L (ref 3.5–5.1)
Sodium: 133 mmol/L — ABNORMAL LOW (ref 135–145)
Sodium: 135 mmol/L (ref 135–145)
Sodium: 135 mmol/L (ref 135–145)
Sodium: 136 mmol/L (ref 135–145)

## 2021-01-04 LAB — CBG MONITORING, ED
Glucose-Capillary: 183 mg/dL — ABNORMAL HIGH (ref 70–99)
Glucose-Capillary: 164 mg/dL — ABNORMAL HIGH (ref 70–99)
Glucose-Capillary: 142 mg/dL — ABNORMAL HIGH (ref 70–99)
Glucose-Capillary: 199 mg/dL — ABNORMAL HIGH (ref 70–99)
Glucose-Capillary: 174 mg/dL — ABNORMAL HIGH (ref 70–99)
Glucose-Capillary: 168 mg/dL — ABNORMAL HIGH (ref 70–99)
Glucose-Capillary: 173 mg/dL — ABNORMAL HIGH (ref 70–99)
Glucose-Capillary: 151 mg/dL — ABNORMAL HIGH (ref 70–99)

## 2021-01-04 LAB — GLUCOSE, CAPILLARY
Glucose-Capillary: 126 mg/dL — ABNORMAL HIGH (ref 70–99)
Glucose-Capillary: 49 mg/dL — ABNORMAL LOW (ref 70–99)
Glucose-Capillary: 177 mg/dL — ABNORMAL HIGH (ref 70–99)
Glucose-Capillary: 233 mg/dL — ABNORMAL HIGH (ref 70–99)

## 2021-01-04 MED ORDER — INSULIN GLARGINE-YFGN 100 UNIT/ML ~~LOC~~ SOLN
20.0000 [IU] | Freq: Every day | SUBCUTANEOUS | Status: DC
  Administered 2021-01-04 – 2021-01-05 (×2): 20 [IU] via SUBCUTANEOUS
  Filled 2021-01-04 (×3): qty 0.2

## 2021-01-04 MED ORDER — POTASSIUM CHLORIDE CRYS ER 20 MEQ PO TBCR
40.0000 meq | EXTENDED_RELEASE_TABLET | Freq: Once | ORAL | Status: AC
  Administered 2021-01-04: 40 meq via ORAL
  Filled 2021-01-04: qty 2

## 2021-01-04 MED ORDER — INSULIN ASPART 100 UNIT/ML IJ SOLN
5.0000 [IU] | Freq: Three times a day (TID) | INTRAMUSCULAR | Status: DC
  Administered 2021-01-04 – 2021-01-05 (×3): 5 [IU] via SUBCUTANEOUS

## 2021-01-04 MED ORDER — SERTRALINE HCL 50 MG PO TABS
75.0000 mg | ORAL_TABLET | Freq: Every day | ORAL | Status: DC
  Administered 2021-01-04 – 2021-01-05 (×2): 75 mg via ORAL
  Filled 2021-01-04: qty 1
  Filled 2021-01-04: qty 2

## 2021-01-04 MED ORDER — INSULIN ASPART 100 UNIT/ML IJ SOLN
0.0000 [IU] | Freq: Three times a day (TID) | INTRAMUSCULAR | Status: DC
  Administered 2021-01-04 (×2): 2 [IU] via SUBCUTANEOUS
  Administered 2021-01-05: 3 [IU] via SUBCUTANEOUS

## 2021-01-04 NOTE — ED Notes (Signed)
Attempted to draw labs from IV line with no success. Will notify phlebotomist to draw labs.

## 2021-01-04 NOTE — Progress Notes (Signed)
Patient's BMP showed potassium of 3.4 and anion gap of 16. Ordered 40 meq of KCl. Will continue on endo tool and will recheck BMP in 4 hours to monitor anion gap. Plan is to transition off endo tool once anion gap closed.

## 2021-01-04 NOTE — Progress Notes (Addendum)
FPTS Brief Progress Note  S: Patient was laying comfortably in bed and watching TV upon arrival.  She reports feeling much better and has no complaint at this time.  Denies any headaches or abdominal pain. Endorses mild abdominal cramps from her menses.   O: BP 113/78    Pulse (!) 105    Temp 98.5 F (36.9 C)    Resp 16    SpO2 100%   General:Awake, well appearing, NAD HEENT: Atraumatic, MMM, No sclera icterus CV: RRR, no murmurs, normal S1/S2 Pulm: CTAB, good WOB on RA Abd: Soft, no distension, no tenderness Ext: No BLE edema, +2 Pedal and radial pulse.    A/P: DKA 2/2 poor medication adherence  uncontrolled T1DM Last CBG is 233. -Continue Semglee 20 units/day team -Continue NovoLog 5 units after meals -Continue sSSI  - Orders reviewed. Labs for AM ordered, which was adjusted as needed.  -Continue plan per day team   Jerre Simon, MD 01/04/2021, 9:17 PM PGY-1, [place] [facility] Night Resident  Please page [phone] with questions.

## 2021-01-04 NOTE — Progress Notes (Signed)
Family Medicine Teaching Service Daily Progress Note Intern Pager: [phone]  Patient name: Dawn [name] Medical record number: [id number] Date of birth: 01/27/2000 Age: 21 y.o. Gender: female  Primary Care Provider: Dana Allan, MD Consultants: None Code Status: Full  Pt Overview and Major Events to Date:  1/1: Admitted 1/2: Anion gap closed  Assessment and Plan: Dawn [name] is a 21 y.o. female presenting with DKA in the setting of recent alcohol use. PMH is significant for T1DM, asthma, MDD, migraines.  DKA 2/2 poor medication adherence   acute metabolic encephalopathy: Resolved   uncontrolled T1DM Patient presents with closed anion gap at 8 this AM upon most recent BMP check.  Glucose control has been obtained with Endo tool and patient was still receiving continuous D5 LR until anion gap closed.  Most recent glucose levels ranged from 135-199. -Strict I's and O's -Start Semglee 20u -Start Aspart 5u TID with meals -Start sensitive SSI -Discontinue Endo tool and D5 LR 1 hour after receiving subcutaneous LAI -BMP @1100 , replete potassium as necessary  AKI: Resolved Creatinine 0.86, baseline 0.6.  MDD Patient is at baseline mental status and has diet on board.  Capable of taking medications. -Restart home medication Zoloft 75 mg daily  FEN/GI: Carb modified PPx: Lovenox Dispo:Home tomorrow pending clinical improvement . Barriers include restarting home diabetes medications.   Subjective:  Patient was content staying in bed and not getting up to discuss care with primary team.  She eventually set up and engaged in conversation with thorough prompting.  She did not want any family members to visit her in the hospital but states that they will be able to pick her up when she is discharged.  She does not have any new concerns at this time.  Objective: Temp:  [98 F (36.7 C)-98.8 F (37.1 C)] 98.4 F (36.9 C) (01/01 1900) Pulse Rate:  [96-120] 106 (01/02 0700) Resp:   [15-29] 17 (01/02 0700) BP: (92-130)/(63-98) 122/98 (01/02 0700) SpO2:  [99 %-100 %] 100 % (01/02 0700) Physical Exam: General: Awake and alert, NAD Cardiovascular: RRR, no murmurs auscultated Respiratory: CTA B, no increased work of breathing  Laboratory: Recent Labs  Lab 01/03/21 0253 01/03/21 0254  WBC 14.3*  --   HGB 13.1 15.3*  HCT 46.5* 45.0  PLT 274  --    Recent Labs  Lab 01/03/21 1200 01/03/21 1642 01/04/21 0016  NA 143 140 136  K 4.2 3.6 3.4*  CL 117* 114* 109  CO2 7* 10* 11*  BUN 14 9 7   CREATININE 1.28* 1.06* 0.86  CALCIUM 8.9 8.7* 8.5*  GLUCOSE 134* 168* 142*   CBG (last 3)  Recent Labs    01/04/21 0359 01/04/21 0505 01/04/21 0600  GLUCAP 142* 199* 174*     Imaging/Diagnostic Tests: No results found.  03/04/21, DO 01/04/2021, 7:37 AM PGY-1, [place] [facility] FPTS Intern pager: [phone], text pages welcome

## 2021-01-04 NOTE — Progress Notes (Signed)
Inpatient Diabetes Program Recommendations  AACE/ADA: New Consensus Statement on Inpatient Glycemic Control (2015)  Target Ranges:  Prepandial:   less than 140 mg/dL      Peak postprandial:   less than 180 mg/dL (1-2 hours)      Critically ill patients:  140 - 180 mg/dL   Lab Results  Component Value Date   GLUCAP 173 (H) 01/04/2021   HGBA1C 14.5 (H) 11/26/2020    Review of Glycemic Control  Diabetes history: DM1 Outpatient Diabetes medications: Lantus 28 QHS, Humalog 10 TID Current orders for Inpatient glycemic control: IV insulin per EndoTool for DKA  HgbA1C - 14.5% Endo - Dr Hartford Poli with [facility]  Inpatient Diabetes Program Recommendations:    Continue with IV insulin until criteria met for discontinuation. CO2 still low at 13.  Need beta-hydroxybutyric acid. Last one > 8.  For transition to SQ insulin: Semglee 24 units QD Novolog 0-9 units TID with meals and 0-5 HS Novolog 5 units TID for meal coverage if eating > 50%  Follow daily.  Thank you. Lorenda Peck, RD, LDN, CDE Inpatient Diabetes Coordinator [phone]

## 2021-01-04 NOTE — ED Notes (Signed)
Breakfast Orders Placed °

## 2021-01-04 NOTE — ED Notes (Signed)
Dr.Dahbura at bedside. Aware of current anion gap of 8. MD states he will rounds with attending to change orders regarding insulin drip. Will continue to monitor.
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
## 2021-01-04 NOTE — Hospital Course (Addendum)
Dawn [name] is a 21 y.o.female with a history of T1DM, asthma, MDD, migraines who was admitted to the [facility] for acute metabolic encephalopathy and DKA. Her hospital course is detailed below:  DKA 2/2 poor medication compliance and alcohol use   acute metabolic encephalopathy   uncontrolled T1DM   hypothermia Patient presented to ED confused and hyperglycemic with Kussmaul breathing with glucose greater than 700 after recent vodka intake and poor medication adherence (at least one day) of mealtime insulin.  VBG noted pH 6.8.  Patient did not present hyperkalemic but did present with anion gap of 29, BHB>8.  T93.9.  Patient received fluid boluses, bair hugger, and started on Endo tool.  Glucose control obtained quickly but anion gap persisted for greater than 24 hours.  From history, it was determined that DKA was caused by patient missing at least one day of her insulin. Patient was transitioned to subcutaneous insulin when anion gap closed. She was discharged in stable condition on 01/05/2021 with instructions to continue her home insulin regimen with plans to possibly transition to an insulin pump as an outpatient.   Other chronic conditions were medically managed with home medications and formulary alternatives as necessary (asthma, MDD)  PCP Follow-up Recommendations: 1) Patient to follow-up with pharmacy team to discuss insurance coverage of CGM, pump 2) Encourage necessity of adherence to insulin regimen, avoidance of etOH 3) Repeat BMP to monitor K

## 2021-01-04 NOTE — Progress Notes (Signed)
Spoke to lab regarding patient's most recent BMP which had not yet resulted.  Lab informed that there was an issue with the sample and it needed to be recollected.  Spoke to the patient's nurse who was already aware and working on drawing the repeat.    Went to evaluate the patient along with Dr. Elliot Gurney.  Patient was in no distress, was alert and oriented with no signs of confusion, denied any chest pain or other complaints.  Discussed with the patient that we need to get blood to monitor electrolytes and anion gap and then we will hopefully be able to transition off of the Endo tool once the gap is closed.  Explained to her that if we cannot get a good blood sample we will need to pause the Endo tool to avoid the risk of hypokalemia.  Fortunately the insulin drip is only running at ~1 unit/h and has been doing so for the past couple of hours so I am less concerned about this causing hypokalemia at this time.  Patient's nurse was able to quickly get a repeat BMP drawn.  Greatly appreciate her help.  If there are any further delays or issues with processing this BMP sample we will pause the insulin drip, otherwise we will keep it at the current rate of 1 unit/h (or less if necessary based off CBGs) until this results.

## 2021-01-05 LAB — BASIC METABOLIC PANEL
Anion gap: 10 (ref 5–15)
BUN: <5 mg/dL — ABNORMAL LOW (ref 6–20)
CO2: 22 mmol/L (ref 22–32)
Calcium: 8.6 mg/dL — ABNORMAL LOW (ref 8.9–10.3)
Chloride: 106 mmol/L (ref 98–111)
Creatinine, Ser: 0.52 mg/dL (ref 0.44–1.00)
GFR, Estimated: >60 mL/min (ref >60)
Glucose, Bld: 182 mg/dL — ABNORMAL HIGH (ref 70–99)
Potassium: 3 mmol/L — ABNORMAL LOW (ref 3.5–5.1)
Sodium: 138 mmol/L (ref 135–145)

## 2021-01-05 LAB — GLUCOSE, CAPILLARY
Glucose-Capillary: 206 mg/dL — ABNORMAL HIGH (ref 70–99)
Glucose-Capillary: 214 mg/dL — ABNORMAL HIGH (ref 70–99)

## 2021-01-05 LAB — MAGNESIUM: Magnesium: 1.7 mg/dL (ref 1.7–2.4)

## 2021-01-05 LAB — HEMOGLOBIN A1C
Hgb A1c MFr Bld: >15.5 % — ABNORMAL HIGH (ref 4.8–5.6)
Mean Plasma Glucose: >398 mg/dL

## 2021-01-05 MED ORDER — POTASSIUM CHLORIDE CRYS ER 20 MEQ PO TBCR
40.0000 meq | EXTENDED_RELEASE_TABLET | Freq: Four times a day (QID) | ORAL | Status: AC
  Administered 2021-01-05 (×2): 40 meq via ORAL
  Filled 2021-01-05 (×2): qty 2

## 2021-01-05 NOTE — Discharge Instructions (Addendum)
Dear Dawn [name],  Thank you for letting us participate in your care. You were hospitalized for confusion and high blood sugars and diagnosed with DKA (diabetic ketoacidosis) ([place]). You were treated with insulin and we are discharging you now that you are feeling better and your blood sugars have stabilized.   POST-HOSPITAL & CARE INSTRUCTIONS Be sure that you are not taking your insulin as prescribed.  You should take 28 units of long-acting insulin and 10 units of short acting insulin with each meal. We will see you in our clinic later this week and then have you follow-up with our pharmacy team next week.  We can have further discussions about getting you an insulin pump and CGM. You may continue taking your other medications as prescribed. Go to your follow up appointments (listed below)   DOCTOR'S APPOINTMENT   Future Appointments  Date Time Provider [place]  01/08/2021  9:25 AM Eppie Gibson, MD [facility] [place]  01/12/2021  8:30 AM Leavy Cella, RPH-CPP [facility] [place]     Take care and be well!  [facility]  [place], [place] [address] [phone]

## 2021-01-05 NOTE — Plan of Care (Signed)

## 2021-01-05 NOTE — Progress Notes (Signed)
Reviewed d/c instructions with pt, d/c packet given to pt, piv removed. No distress or pain noted.

## 2021-01-05 NOTE — Discharge Summary (Addendum)
[facility] Discharge Summary  Patient name: Dawn [name] Medical record number: [id number] Date of birth: 2000/06/25 Age: 21 y.o. Gender: female Date of Admission: 01/03/2021  Date of Discharge: 01/05/2021 Admitting Physician: Wells Guiles, DO  Primary Care Provider: [name], MD Consultants: None  Indication for Hospitalization: DKA  Discharge Diagnoses/Problem List:  Principal Problem:   DKA (diabetic ketoacidosis) ([place]) Active Problems:   Noncompliance with diabetes treatment   Asthma   AKI (acute kidney injury) ([place])   DKA, type 1 ([name])   Disposition: Home  Discharge Condition: Stable, at baseline  Discharge Exam:  General: Awake, alert, NAD Cardio: RRR, no m/r/g Pulm: Normal WOB on RA, lungs clear throughout Abd: Non-tender, non-distended, no mass Ext: Without edema or deformity Psych: Mood and affect are normal   Brief Hospital Course:  Dawn [name] is a 21 y.o.female with a history of T1DM, asthma, MDD, migraines who was admitted to the [facility] at [facility] for acute metabolic encephalopathy and DKA. Her hospital course is detailed below:  DKA 2/2 poor medication compliance and alcohol use   acute metabolic encephalopathy   uncontrolled T1DM   hypothermia Patient presented to ED confused and hyperglycemic with [place] breathing with glucose greater than 700 after recent vodka intake and poor medication adherence (at least one day) of mealtime insulin.  VBG noted pH 6.8.  Patient did not present hyperkalemic but did present with anion gap of 29, BHB>8.  T93.9.  Patient received fluid boluses, bair hugger, and started on Endo tool.  Glucose control obtained quickly but anion gap persisted for greater than 24 hours.  From history, it was determined that DKA was caused by patient missing at least one day of her insulin. Patient was transitioned to subcutaneous insulin when anion gap closed. She was discharged in stable  condition on 01/05/2021 with instructions to continue her home insulin regimen with plans to possibly transition to an insulin pump as an outpatient.   Other chronic conditions were medically managed with home medications and formulary alternatives as necessary (asthma, MDD)  PCP Follow-up Recommendations: 1) Patient to follow-up with pharmacy team to discuss insurance coverage of CGM, pump 2) Encourage necessity of adherence to insulin regimen, avoidance of etOH 3) Repeat BMP to monitor K   Significant Procedures: None  Significant Labs and Imaging:  Recent Labs  Lab 01/03/21 0253 01/03/21 0254  WBC 14.3*  --   HGB 13.1 15.3*  HCT 46.5* 45.0  PLT 274  --    Recent Labs  Lab 01/03/21 0442 01/03/21 0833 01/04/21 0016 01/04/21 0644 01/04/21 1117 01/04/21 1814 01/05/21 0303  NA 138   < > 136 133* 135 135 138  K 5.9*   < > 3.4* 4.1 3.5 3.5 3.0*  CL 110   < > 109 112* 106 107 106  CO2 <7*   < > 11* 13* 16* 15* 22  GLUCOSE 436*   < > 142* 135* 148* 193* 182*  BUN 21*   < > 7 6 <5* 6 <5*  CREATININE 1.51*   < > 0.86 0.82 0.68 0.83 0.52  CALCIUM 8.5*   < > 8.5* 8.5* 8.8* 9.1 8.6*  MG 2.6*  --   --   --   --   --  1.7   < > = values in this interval not displayed.    Results/Tests Pending at Time of Discharge: None  Discharge Medications:  Allergies as of 01/05/2021       Reactions  Shellfish Allergy Rash, Other (See Comments)   Reaction to scallops        Medication List     TAKE these medications    Accu-Chek Guide test strip Generic drug: glucose blood Use to check glucose 6x daily   Accu-Chek Guide w/Device Kit 1 kit by Does not apply route daily as needed.   Accu-Chek Softclix Lancets lancets Please use to check blood sugar up to 6 times/day.   acetaminophen 500 MG tablet Commonly known as: TYLENOL Take 500 mg by mouth every 6 (six) hours as needed for mild pain.   BD Pen Needle Nano 2nd Gen 32G X 4 MM Misc Generic drug: Insulin Pen Needle USE TO  INJECT INSULIN 6 TIMES DAILY   Dexcom G6 Transmitter Misc See admin instructions.   FreeStyle Libre 2 Sensor Misc Change sensor every 14 days.   glucagon 1 MG injection Inject 1 mg IM for severe hypoglycemia What changed:  how much to take how to take this when to take this reasons to take this additional instructions   insulin glargine 100 UNIT/ML injection Commonly known as: LANTUS Inject 0.28 mLs (28 Units total) into the skin at bedtime.   insulin lispro 100 UNIT/ML KwikPen Commonly known as: HumaLOG KwikPen Inject 10 Units into the skin with breakfast, with lunch, and with evening meal.   INSULIN SYRINGE 1CC/30GX1/2" 30G X 1/2" 1 ML Misc Use to draw up Lantus daily.   INSULIN SYRINGE .5CC/30GX1/2" 30G X 1/2" 0.5 ML Misc Use to draw up Novolog three times daily.   ProAir HFA 108 (90 Base) MCG/ACT inhaler Generic drug: albuterol Inhale 2 puffs into the lungs every 6 (six) hours as needed for wheezing or shortness of breath. What changed: Another medication with the same name was changed. Make sure you understand how and when to take each.   albuterol (2.5 MG/3ML) 0.083% nebulizer solution Commonly known as: PROVENTIL TAKE 3 MLS BY NEBULIZER EVERY 6 HOURS AS NEEDED What changed: See the new instructions.   sertraline 50 MG tablet Commonly known as: ZOLOFT Take 1.5 tablets (75 mg total) by mouth daily.   SUMAtriptan 25 MG tablet Commonly known as: Imitrex Take 0.5 tablets (12.5 mg total) by mouth every 2 (two) hours as needed for migraine. May repeat in 2 hours if headache persists or recurs.        Discharge Instructions: Please refer to Patient Instructions section of EMR for full details.  Patient was counseled important signs and symptoms that should prompt return to medical care, changes in medications, dietary instructions, activity restrictions, and follow up appointments.   Follow-Up Appointments: 01/08/2021 9:25AM Eppie Gibson, MD [place]  01/14/2021 10:30AM [facility], [place]  Eppie Gibson, MD 01/05/2021, 10:16 AM PGY-1, [place]

## 2021-01-08 ENCOUNTER — Encounter: Payer: Self-pay | Admitting: Student

## 2021-01-08 ENCOUNTER — Other Ambulatory Visit: Payer: Self-pay

## 2021-01-08 ENCOUNTER — Ambulatory Visit (INDEPENDENT_AMBULATORY_CARE_PROVIDER_SITE_OTHER): Payer: Medicaid Other | Admitting: Student

## 2021-01-08 VITALS — BP 110/70 | HR 78 | Ht 61.0 in | Wt 101.0 lb

## 2021-01-08 DIAGNOSIS — E1065 Type 1 diabetes mellitus with hyperglycemia: Secondary | ICD-10-CM

## 2021-01-08 DIAGNOSIS — Z8669 Personal history of other diseases of the nervous system and sense organs: Secondary | ICD-10-CM

## 2021-01-08 DIAGNOSIS — E1010 Type 1 diabetes mellitus with ketoacidosis without coma: Secondary | ICD-10-CM | POA: Diagnosis not present

## 2021-01-08 MED ORDER — GLUCAGON (RDNA) 1 MG IJ KIT: PACK | INTRAMUSCULAR | 4 refills | Status: DC

## 2021-01-08 MED ORDER — SUMATRIPTAN SUCCINATE 25 MG PO TABS: 12.5000 mg | ORAL_TABLET | ORAL | 0 refills | Status: DC | PRN

## 2021-01-08 NOTE — Progress Notes (Addendum)
° ° °  SUBJECTIVE:   CHIEF COMPLAINT / HPI:   Hospital Follow-Up Patient was recently admitted to [facility] with DKA in the setting of poor insulin compliance and alcohol use over the new year holiday. She has been doing well since discharge. Continues to express interest in eventually transitioning to a CGM/pump combination.  She already has an appointment with our pharmacy team next week to discuss this. Currently uses 28u Lantus daily and 10u humalog TID with meals. She does follow with endo and should be following up with them later this month.  PERTINENT  PMH / PSH: T1DM, asthma, MDD, migraine  OBJECTIVE:   BP 110/70    Pulse 78    Ht 5\' 1"  (1.549 m)    Wt 101 lb (45.8 kg)    LMP 12/03/2020    BMI 19.08 kg/m   Gen: Alert, interactive, NAD Cardio: RRR, no m/r/g Pulm: Normal WOB on RA Extremities: Without edema formfitting   ASSESSMENT/PLAN:   Uncontrolled type 1 diabetes mellitus with hyperglycemia ([place]) Doing well today.  Sugars have been high 100s-low 200s since discharge.  We will continue with Lantus 28 units daily Humalog 10 units 3 times daily with meals.  No lows since discharge. -Follow-up with pharmacy team to discuss CGM/pump -Follow-up with endocrine later this month -Refill glucagon injectors  Hx of migraines Requesting refill of sumatriptan.  No increased frequency of headaches or acute complaints. -Refill sent as requested   Pearla Dubonnet, [place]

## 2021-01-08 NOTE — Patient Instructions (Signed)
Dawn [name], It is such a joy to take care you! Thank you for coming in today.   As a reminder, here is a recap of what we talked about today:  -I am so glad that your blood sugars have been looking good since discharge.  Keep taking your insulin as you have been and we will be looking forward to seeing you next week to talk about a CGM/pump. -I am sending a refill for your glucagon injection and your Imitrex  We are checking some labs today. I will call you if they are abnormal. I will send you a MyChart message or a letter if they are normal.  If you do not hear about your labs in the next 2 weeks please let us know.  Take care and seek immediate care sooner if you develop any concerns.   Marnee Guarneri, MD [place]

## 2021-01-08 NOTE — Assessment & Plan Note (Signed)
Doing well today.  Sugars have been high 100s-low 200s since discharge.  We will continue with Lantus 28 units daily Humalog 10 units 3 times daily with meals.  No lows since discharge. -Follow-up with pharmacy team to discuss CGM/pump -Follow-up with endocrine later this month -Refill glucagon injectors

## 2021-01-08 NOTE — Assessment & Plan Note (Signed)
Requesting refill of sumatriptan.  No increased frequency of headaches or acute complaints. -Refill sent as requested

## 2021-01-12 ENCOUNTER — Ambulatory Visit: Payer: Medicaid Other | Admitting: Pharmacist

## 2021-01-14 ENCOUNTER — Ambulatory Visit: Payer: Medicaid Other | Admitting: Pharmacist

## 2021-01-22 NOTE — Patient Instructions (Addendum)
It was wonderful to see you today.  Please bring ALL of your medications with you to every visit.   Today we talked about:  -We are checking for sexually transmitted infections including chlamydia, gonorrhea, trichomonas, HIV, syphilis and hepatitis B. I will let you know of the results via MyChart or telephone call. We are also checking for bacterial vaginosis and yeast, which are not sexually transmitted infections.  -You should abstain from sexual activity until we have the results. If your test is positive for a sexually transmitted infection, it is important that both you and your partner are both treated.  -It is always important to use barrier protection, such as condoms, to help prevent sexually transmitted infections.   -You received your first Depo injection today. You will need to return every 3 months for repeat injection. -We discussed starting a medication called Truvada which can help protect against HIV by 99% if taken every day.  As we discussed, you will need to return every 3 months for testing prior to refill.  Once your screening test results return, I'll send in the medication   Thank you for choosing [facility].   Please call [phone] with any questions about today's appointment.  Please be sure to schedule follow up at the front  desk before you leave today.   Sharion Settler, DO PGY-2 Family Medicine

## 2021-01-22 NOTE — Progress Notes (Signed)
° ° °  SUBJECTIVE:   CHIEF COMPLAINT / HPI:   Unprotected Intercourse: Patient is a 21 y.o. female presenting due to unprotected intercourse 2 weeks ago.  Denies any vaginal discharge or pain with intercourse.  She has 1 partner but is unsure if her partner is also monogamous.  She normally uses condoms for protection, but did not this last time.  She was previously on Depo and tolerated well, and is interested in resuming that.  She is interested in screening for sexually transmitted infections today. She was diagnosed with previous chlamydia infection in June 2022, reports both she and her partner were treated.   PERTINENT  PMH / PSH: T1DM, Chlamydia   OBJECTIVE:   BP 100/60    Pulse 76    Ht 5\' 1"  (1.549 m)    Wt 105 lb 4 oz (47.7 kg)    LMP 01/21/2021    SpO2 96%    BMI 19.89 kg/m    General: NAD, pleasant, able to participate in exam Respiratory: Normal effort, no obvious respiratory distress Pelvic: VULVA: normal appearing vulva with no masses, tenderness or lesions, VAGINA: Normal appearing vagina with normal color, no lesions, with yellow and thick discharge present CERVIX: No lesions, yellow and thick discharge present,  Chaperone 01/23/2021, CMA present for pelvic exam  ASSESSMENT/PLAN:   Depo-Provera contraceptive status Urine pregnancy test negative today. Interested in Depo shots. She was previously on this and tolerated it well. -First Depo shot provided today  1. Routine screening for STI (sexually transmitted infection) 21 y.o. female with unprotected intercourse 2 weeks ago.  Physical exam significant for yellow, thick discharge.  Wet prep performed today shows +bacteria, clue cells and positive whiff consistent with BV. Trichomonas negative.  Patient is interested in full STI screening.   - Cervicovaginal ancillary only - POCT Wet Prep [facility]): Positive for BV. Will offer treatment.  - POCT urine pregnancy: Negative - HIV antibody (with reflex) - RPR -  Hepatitis B surface antigen  2. Encounter for counseling before starting and about pre-exposure prophylaxis for HIV Interested in PrEP therapy today.  Does have a history of chlamydia infection in June 2022.  Reports only one sexual partner currently, but his HIV status is unknown. They use condoms for the most part but have had some unprotected intercourse as well. She is interested in prevention of HIV. Will do screening tests and if negative will send in prescription for Truvada.  She was counseled on the most common side effects.  - HIV antibody (with reflex) - RPR - Hepatitis B surface antigen - Hepatitis B surface antibody,qualitative - Hepatitis B core antibody, IgM - Hepatitis C antibody (reflex, frozen specimen) - Basic Metabolic Panel - Return in 3 months for repeat HIV and RPR tests prior to refill   July 2022, DO [facility]

## 2021-01-25 ENCOUNTER — Other Ambulatory Visit (HOSPITAL_COMMUNITY)
Admission: RE | Admit: 2021-01-25 | Discharge: 2021-01-25 | Disposition: A | Discharge status: Home / Self Care | Payer: Medicaid Other | Source: Ambulatory Visit | Attending: Family Medicine | Admitting: Family Medicine

## 2021-01-25 ENCOUNTER — Other Ambulatory Visit: Payer: Self-pay

## 2021-01-25 ENCOUNTER — Ambulatory Visit (INDEPENDENT_AMBULATORY_CARE_PROVIDER_SITE_OTHER): Payer: Medicaid Other

## 2021-01-25 ENCOUNTER — Other Ambulatory Visit: Payer: Self-pay | Admitting: Family Medicine

## 2021-01-25 ENCOUNTER — Ambulatory Visit (INDEPENDENT_AMBULATORY_CARE_PROVIDER_SITE_OTHER): Payer: Medicaid Other | Admitting: Family Medicine

## 2021-01-25 VITALS — BP 100/60 | HR 76 | Ht 61.0 in | Wt 105.2 lb

## 2021-01-25 DIAGNOSIS — Z717 Human immunodeficiency virus [HIV] counseling: Secondary | ICD-10-CM

## 2021-01-25 DIAGNOSIS — Z23 Encounter for immunization: Secondary | ICD-10-CM | POA: Diagnosis present

## 2021-01-25 DIAGNOSIS — Z7189 Other specified counseling: Secondary | ICD-10-CM

## 2021-01-25 DIAGNOSIS — Z2981 Encounter for HIV pre-exposure prophylaxis: Secondary | ICD-10-CM | POA: Insufficient documentation

## 2021-01-25 DIAGNOSIS — Z113 Encounter for screening for infections with a predominantly sexual mode of transmission: Secondary | ICD-10-CM | POA: Insufficient documentation

## 2021-01-25 DIAGNOSIS — Z3042 Encounter for surveillance of injectable contraceptive: Secondary | ICD-10-CM | POA: Diagnosis not present

## 2021-01-25 HISTORY — DX: Other specified counseling: Z71.89

## 2021-01-25 LAB — POCT WET PREP (WET MOUNT)
Clue Cells Wet Prep Whiff POC: POSITIVE
Trichomonas Wet Prep HPF POC: ABSENT

## 2021-01-25 LAB — POCT URINE PREGNANCY: Preg Test, Ur: NEGATIVE

## 2021-01-25 MED ORDER — METRONIDAZOLE 500 MG PO TABS: 500.0000 mg | ORAL_TABLET | Freq: Two times a day (BID) | ORAL | 0 refills | Status: DC

## 2021-01-25 MED ORDER — MEDROXYPROGESTERONE ACETATE 150 MG/ML IM SUSP
150.0000 mg | Freq: Once | INTRAMUSCULAR | Status: AC
  Administered 2021-01-25: 150 mg via INTRAMUSCULAR

## 2021-01-25 NOTE — Assessment & Plan Note (Signed)
Interested in PrEP therapy today.  Does have a history of chlamydia infection in June 2022.  Reports only one sexual partner currently, but his HIV status is unknown. They use condoms for the most part but have had some unprotected intercourse as well. She is interested in prevention of HIV. Will do screening tests and if negative will send in prescription for Truvada.  She was counseled on the most common side effects.  - HIV antibody (with reflex) - RPR - Hepatitis B surface antigen - Hepatitis B surface antibody,qualitative - Hepatitis B core antibody, IgM - Hepatitis C antibody (reflex, frozen specimen) - Basic Metabolic Panel - Return in 3 months for repeat HIV and RPR tests prior to refill

## 2021-01-25 NOTE — Assessment & Plan Note (Signed)
Urine pregnancy test negative today. Interested in Depo shots. She was previously on this and tolerated it well. -First Depo shot provided today

## 2021-01-26 LAB — BASIC METABOLIC PANEL
BUN/Creatinine Ratio: 19 (ref 9–23)
BUN: 13 mg/dL (ref 6–20)
CO2: 18 mmol/L — ABNORMAL LOW (ref 20–29)
Calcium: 9.8 mg/dL (ref 8.7–10.2)
Chloride: 103 mmol/L (ref 96–106)
Creatinine, Ser: 0.67 mg/dL (ref 0.57–1.00)
Glucose: 232 mg/dL — ABNORMAL HIGH (ref 70–99)
Potassium: 3.5 mmol/L (ref 3.5–5.2)
Sodium: 140 mmol/L (ref 134–144)
eGFR: 129 mL/min/{1.73_m2} (ref >59)

## 2021-01-26 LAB — CERVICOVAGINAL ANCILLARY ONLY
Chlamydia: POSITIVE — AB
Comment: NEGATIVE
Comment: NORMAL
Neisseria Gonorrhea: NEGATIVE

## 2021-01-26 LAB — HIV ANTIBODY (ROUTINE TESTING W REFLEX): HIV Screen 4th Generation wRfx: NONREACTIVE

## 2021-01-26 LAB — HCV INTERPRETATION

## 2021-01-26 LAB — HCV AB W REFLEX TO QUANT PCR: HCV Ab: <0.1 s/co ratio (ref 0.0–0.9)

## 2021-01-26 LAB — RPR: RPR Ser Ql: NONREACTIVE

## 2021-01-26 LAB — HEPATITIS B CORE ANTIBODY, IGM: Hep B C IgM: NEGATIVE

## 2021-01-27 ENCOUNTER — Encounter: Payer: Self-pay | Admitting: Family Medicine

## 2021-01-27 ENCOUNTER — Other Ambulatory Visit: Payer: Self-pay | Admitting: Family Medicine

## 2021-01-27 MED ORDER — DOXYCYCLINE HYCLATE 100 MG PO TABS: 100.0000 mg | ORAL_TABLET | Freq: Two times a day (BID) | ORAL | 0 refills | Status: AC

## 2021-01-27 NOTE — Telephone Encounter (Signed)
Please forward to provider who seen patient for this concern.  Dana Allan, MD [facility]

## 2021-01-28 ENCOUNTER — Other Ambulatory Visit: Payer: Self-pay | Admitting: Student

## 2021-01-28 ENCOUNTER — Other Ambulatory Visit: Payer: Self-pay | Admitting: Family Medicine

## 2021-01-28 DIAGNOSIS — Z8669 Personal history of other diseases of the nervous system and sense organs: Secondary | ICD-10-CM

## 2021-01-31 NOTE — Telephone Encounter (Signed)
Has patient picked up refill from 4 weeks ago?

## 2021-01-31 NOTE — Telephone Encounter (Signed)
Did patient pick up last refill?

## 2021-02-07 ENCOUNTER — Encounter (HOSPITAL_COMMUNITY): Payer: Self-pay

## 2021-02-07 ENCOUNTER — Inpatient Hospital Stay (HOSPITAL_COMMUNITY)
Admission: EM | Admit: 2021-02-07 | Discharge: 2021-02-09 | DRG: 638 | Disposition: A | Discharge status: Home / Self Care | Payer: Medicaid Other | Attending: Pulmonary Disease | Admitting: Pulmonary Disease

## 2021-02-07 ENCOUNTER — Other Ambulatory Visit: Payer: Self-pay

## 2021-02-07 ENCOUNTER — Inpatient Hospital Stay (HOSPITAL_COMMUNITY): Payer: Medicaid Other

## 2021-02-07 DIAGNOSIS — E875 Hyperkalemia: Secondary | ICD-10-CM | POA: Diagnosis present

## 2021-02-07 DIAGNOSIS — E0810 Diabetes mellitus due to underlying condition with ketoacidosis without coma: Secondary | ICD-10-CM | POA: Diagnosis not present

## 2021-02-07 DIAGNOSIS — J45909 Unspecified asthma, uncomplicated: Secondary | ICD-10-CM | POA: Diagnosis present

## 2021-02-07 DIAGNOSIS — T383X6A Underdosing of insulin and oral hypoglycemic [antidiabetic] drugs, initial encounter: Secondary | ICD-10-CM | POA: Diagnosis present

## 2021-02-07 DIAGNOSIS — R112 Nausea with vomiting, unspecified: Secondary | ICD-10-CM | POA: Diagnosis present

## 2021-02-07 DIAGNOSIS — R748 Abnormal levels of other serum enzymes: Secondary | ICD-10-CM | POA: Diagnosis not present

## 2021-02-07 DIAGNOSIS — E1010 Type 1 diabetes mellitus with ketoacidosis without coma: Secondary | ICD-10-CM | POA: Diagnosis present

## 2021-02-07 DIAGNOSIS — Z20822 Contact with and (suspected) exposure to covid-19: Secondary | ICD-10-CM | POA: Diagnosis present

## 2021-02-07 DIAGNOSIS — G43909 Migraine, unspecified, not intractable, without status migrainosus: Secondary | ICD-10-CM | POA: Diagnosis present

## 2021-02-07 DIAGNOSIS — Z91013 Allergy to seafood: Secondary | ICD-10-CM | POA: Diagnosis not present

## 2021-02-07 DIAGNOSIS — Z825 Family history of asthma and other chronic lower respiratory diseases: Secondary | ICD-10-CM | POA: Diagnosis not present

## 2021-02-07 DIAGNOSIS — Z8249 Family history of ischemic heart disease and other diseases of the circulatory system: Secondary | ICD-10-CM | POA: Diagnosis not present

## 2021-02-07 DIAGNOSIS — Z833 Family history of diabetes mellitus: Secondary | ICD-10-CM | POA: Diagnosis not present

## 2021-02-07 DIAGNOSIS — E1110 Type 2 diabetes mellitus with ketoacidosis without coma: Secondary | ICD-10-CM | POA: Diagnosis present

## 2021-02-07 DIAGNOSIS — E876 Hypokalemia: Secondary | ICD-10-CM | POA: Diagnosis not present

## 2021-02-07 DIAGNOSIS — Z794 Long term (current) use of insulin: Secondary | ICD-10-CM | POA: Diagnosis not present

## 2021-02-07 DIAGNOSIS — N179 Acute kidney failure, unspecified: Secondary | ICD-10-CM | POA: Diagnosis present

## 2021-02-07 DIAGNOSIS — D72829 Elevated white blood cell count, unspecified: Secondary | ICD-10-CM | POA: Diagnosis present

## 2021-02-07 LAB — CBC WITH DIFFERENTIAL/PLATELET
Abs Immature Granulocytes: 0.31 10*3/uL — ABNORMAL HIGH (ref 0.00–0.07)
Basophils Absolute: 0.1 10*3/uL (ref 0.0–0.1)
Basophils Relative: 1 %
Eosinophils Absolute: 0 10*3/uL (ref 0.0–0.5)
Eosinophils Relative: 0 %
HCT: 47.6 % — ABNORMAL HIGH (ref 36.0–46.0)
Hemoglobin: 14.1 g/dL (ref 12.0–15.0)
Immature Granulocytes: 2 %
Lymphocytes Relative: 26 %
Lymphs Abs: 5.3 10*3/uL — ABNORMAL HIGH (ref 0.7–4.0)
MCH: 29.2 pg (ref 26.0–34.0)
MCHC: 29.6 g/dL — ABNORMAL LOW (ref 30.0–36.0)
MCV: 98.6 fL (ref 80.0–100.0)
Monocytes Absolute: 0.8 10*3/uL (ref 0.1–1.0)
Monocytes Relative: 4 %
Neutro Abs: 13.9 10*3/uL — ABNORMAL HIGH (ref 1.7–7.7)
Neutrophils Relative %: 67 %
Platelets: 434 10*3/uL — ABNORMAL HIGH (ref 150–400)
RBC: 4.83 MIL/uL (ref 3.87–5.11)
RDW: 13.7 % (ref 11.5–15.5)
WBC: 20.5 10*3/uL — ABNORMAL HIGH (ref 4.0–10.5)
nRBC: 0 % (ref 0.0–0.2)

## 2021-02-07 LAB — URINALYSIS, ROUTINE W REFLEX MICROSCOPIC
Bacteria, UA: NONE SEEN
Bilirubin Urine: NEGATIVE
Glucose, UA: >1000 mg/dL — AB
Hgb urine dipstick: NEGATIVE
Ketones, ur: >80 mg/dL — AB
Leukocytes,Ua: NEGATIVE
Nitrite: NEGATIVE
Specific Gravity, Urine: 1.025 (ref 1.005–1.030)
pH: 5.5 (ref 5.0–8.0)

## 2021-02-07 LAB — GLUCOSE, CAPILLARY
Glucose-Capillary: 348 mg/dL — ABNORMAL HIGH (ref 70–99)
Glucose-Capillary: 231 mg/dL — ABNORMAL HIGH (ref 70–99)
Glucose-Capillary: 177 mg/dL — ABNORMAL HIGH (ref 70–99)
Glucose-Capillary: 205 mg/dL — ABNORMAL HIGH (ref 70–99)
Glucose-Capillary: 214 mg/dL — ABNORMAL HIGH (ref 70–99)
Glucose-Capillary: 194 mg/dL — ABNORMAL HIGH (ref 70–99)
Glucose-Capillary: 175 mg/dL — ABNORMAL HIGH (ref 70–99)
Glucose-Capillary: 178 mg/dL — ABNORMAL HIGH (ref 70–99)
Glucose-Capillary: 206 mg/dL — ABNORMAL HIGH (ref 70–99)
Glucose-Capillary: 195 mg/dL — ABNORMAL HIGH (ref 70–99)
Glucose-Capillary: 151 mg/dL — ABNORMAL HIGH (ref 70–99)
Glucose-Capillary: 241 mg/dL — ABNORMAL HIGH (ref 70–99)
Glucose-Capillary: 190 mg/dL — ABNORMAL HIGH (ref 70–99)
Glucose-Capillary: 159 mg/dL — ABNORMAL HIGH (ref 70–99)

## 2021-02-07 LAB — RESP PANEL BY RT-PCR (FLU A&B, COVID) ARPGX2
Influenza A by PCR: NEGATIVE
Influenza B by PCR: NEGATIVE
SARS Coronavirus 2 by RT PCR: NEGATIVE

## 2021-02-07 LAB — BLOOD GAS, VENOUS
Acid-base deficit: 29.7 mmol/L — ABNORMAL HIGH (ref 0.0–2.0)
Acid-base deficit: 26.9 mmol/L — ABNORMAL HIGH (ref 0.0–2.0)
Bicarbonate: 4.4 mmol/L — ABNORMAL LOW (ref 20.0–28.0)
Bicarbonate: 5.4 mmol/L — ABNORMAL LOW (ref 20.0–28.0)
O2 Saturation: 64.3 %
O2 Saturation: 66.4 %
Patient temperature: 98.6
Patient temperature: 98.6
pCO2, Ven: 25.3 mmHg — ABNORMAL LOW (ref 44.0–60.0)
pCO2, Ven: 29 mmHg — ABNORMAL LOW (ref 44.0–60.0)
pH, Ven: 6.872 — CL (ref 7.250–7.430)
pH, Ven: 6.905 — CL (ref 7.250–7.430)
pO2, Ven: 52 mmHg — ABNORMAL HIGH (ref 32.0–45.0)
pO2, Ven: 46.7 mmHg — ABNORMAL HIGH (ref 32.0–45.0)

## 2021-02-07 LAB — CBG MONITORING, ED
Glucose-Capillary: >600 mg/dL (ref 70–99)
Glucose-Capillary: >600 mg/dL (ref 70–99)
Glucose-Capillary: 449 mg/dL — ABNORMAL HIGH (ref 70–99)
Glucose-Capillary: 561 mg/dL (ref 70–99)
Glucose-Capillary: 368 mg/dL — ABNORMAL HIGH (ref 70–99)
Glucose-Capillary: 425 mg/dL — ABNORMAL HIGH (ref 70–99)

## 2021-02-07 LAB — BETA-HYDROXYBUTYRIC ACID
Beta-Hydroxybutyric Acid: >8 mmol/L — ABNORMAL HIGH (ref 0.05–0.27)
Beta-Hydroxybutyric Acid: >8 mmol/L — ABNORMAL HIGH (ref 0.05–0.27)
Beta-Hydroxybutyric Acid: 7.7 mmol/L — ABNORMAL HIGH (ref 0.05–0.27)
Beta-Hydroxybutyric Acid: 6.08 mmol/L — ABNORMAL HIGH (ref 0.05–0.27)

## 2021-02-07 LAB — PROCALCITONIN: Procalcitonin: 0.62 ng/mL

## 2021-02-07 LAB — BASIC METABOLIC PANEL
Anion gap: 18 — ABNORMAL HIGH (ref 5–15)
Anion gap: 12 (ref 5–15)
BUN: 17 mg/dL (ref 6–20)
BUN: 12 mg/dL (ref 6–20)
CO2: 9 mmol/L — ABNORMAL LOW (ref 22–32)
CO2: 17 mmol/L — ABNORMAL LOW (ref 22–32)
Calcium: 7.9 mg/dL — ABNORMAL LOW (ref 8.9–10.3)
Calcium: 8.1 mg/dL — ABNORMAL LOW (ref 8.9–10.3)
Chloride: 109 mmol/L (ref 98–111)
Chloride: 108 mmol/L (ref 98–111)
Creatinine, Ser: 0.68 mg/dL (ref 0.44–1.00)
Creatinine, Ser: 0.54 mg/dL (ref 0.44–1.00)
GFR, Estimated: >60 mL/min (ref >60)
GFR, Estimated: >60 mL/min (ref >60)
Glucose, Bld: 196 mg/dL — ABNORMAL HIGH (ref 70–99)
Glucose, Bld: 168 mg/dL — ABNORMAL HIGH (ref 70–99)
Potassium: 4.2 mmol/L (ref 3.5–5.1)
Potassium: 3.4 mmol/L — ABNORMAL LOW (ref 3.5–5.1)
Sodium: 136 mmol/L (ref 135–145)
Sodium: 137 mmol/L (ref 135–145)

## 2021-02-07 LAB — CBC
HCT: 41.9 % (ref 36.0–46.0)
Hemoglobin: 12.6 g/dL (ref 12.0–15.0)
MCH: 29.6 pg (ref 26.0–34.0)
MCHC: 30.1 g/dL (ref 30.0–36.0)
MCV: 98.6 fL (ref 80.0–100.0)
Platelets: 273 10*3/uL (ref 150–400)
RBC: 4.25 MIL/uL (ref 3.87–5.11)
RDW: 14 % (ref 11.5–15.5)
WBC: 25.5 10*3/uL — ABNORMAL HIGH (ref 4.0–10.5)
nRBC: 0 % (ref 0.0–0.2)

## 2021-02-07 LAB — COMPREHENSIVE METABOLIC PANEL
ALT: 27 U/L (ref 0–44)
AST: 34 U/L (ref 15–41)
Albumin: 4.7 g/dL (ref 3.5–5.0)
Alkaline Phosphatase: 173 U/L — ABNORMAL HIGH (ref 38–126)
BUN: 26 mg/dL — ABNORMAL HIGH (ref 6–20)
CO2: <7 mmol/L — ABNORMAL LOW (ref 22–32)
Calcium: 9.4 mg/dL (ref 8.9–10.3)
Chloride: 96 mmol/L — ABNORMAL LOW (ref 98–111)
Creatinine, Ser: 1.46 mg/dL — ABNORMAL HIGH (ref 0.44–1.00)
GFR, Estimated: 53 mL/min — ABNORMAL LOW (ref >60)
Glucose, Bld: 799 mg/dL (ref 70–99)
Potassium: 5.9 mmol/L — ABNORMAL HIGH (ref 3.5–5.1)
Sodium: 129 mmol/L — ABNORMAL LOW (ref 135–145)
Total Bilirubin: 1 mg/dL (ref 0.3–1.2)
Total Protein: 9.4 g/dL — ABNORMAL HIGH (ref 6.5–8.1)

## 2021-02-07 LAB — BLOOD GAS, ARTERIAL
Acid-base deficit: 11.1 mmol/L — ABNORMAL HIGH (ref 0.0–2.0)
Bicarbonate: 12.4 mmol/L — ABNORMAL LOW (ref 20.0–28.0)
FIO2: 21
O2 Saturation: 98.7 %
Patient temperature: 99.1
pCO2 arterial: 22 mmHg — ABNORMAL LOW (ref 32.0–48.0)
pH, Arterial: 7.371 (ref 7.350–7.450)
pO2, Arterial: 110 mmHg — ABNORMAL HIGH (ref 83.0–108.0)

## 2021-02-07 LAB — MRSA NEXT GEN BY PCR, NASAL: MRSA by PCR Next Gen: NOT DETECTED

## 2021-02-07 LAB — HEMOGLOBIN A1C
Hgb A1c MFr Bld: 14 % — ABNORMAL HIGH (ref 4.8–5.6)
Mean Plasma Glucose: 355.1 mg/dL

## 2021-02-07 LAB — LIPASE, BLOOD: Lipase: 32 U/L (ref 11–51)

## 2021-02-07 LAB — POC URINE PREG, ED: Preg Test, Ur: NEGATIVE

## 2021-02-07 MED ORDER — ENOXAPARIN SODIUM 40 MG/0.4ML IJ SOSY: 40.0000 mg | PREFILLED_SYRINGE | INTRAMUSCULAR | Status: DC

## 2021-02-07 MED ORDER — DEXTROSE 50 % IV SOLN: 0.0000 mL | INTRAVENOUS | Status: DC | PRN

## 2021-02-07 MED ORDER — LEVOFLOXACIN 750 MG PO TABS
750.0000 mg | ORAL_TABLET | Freq: Every day | ORAL | Status: DC
Start: 2021-02-07 — End: 2021-02-08
  Administered 2021-02-07: 750 mg via ORAL
  Filled 2021-02-07: qty 1

## 2021-02-07 MED ORDER — CHLORHEXIDINE GLUCONATE CLOTH 2 % EX PADS
6.0000 | MEDICATED_PAD | Freq: Every day | CUTANEOUS | Status: DC
  Administered 2021-02-07 – 2021-02-08 (×2): 6 via TOPICAL

## 2021-02-07 MED ORDER — SODIUM CHLORIDE 0.9 % IV BOLUS
1000.0000 mL | Freq: Once | INTRAVENOUS | Status: AC
  Administered 2021-02-07: 1000 mL via INTRAVENOUS

## 2021-02-07 MED ORDER — POTASSIUM CHLORIDE CRYS ER 20 MEQ PO TBCR
40.0000 meq | EXTENDED_RELEASE_TABLET | Freq: Once | ORAL | Status: AC
  Administered 2021-02-07: 40 meq via ORAL
  Filled 2021-02-07: qty 2

## 2021-02-07 MED ORDER — DEXTROSE IN LACTATED RINGERS 5 % IV SOLN: INTRAVENOUS | Status: DC

## 2021-02-07 MED ORDER — SODIUM CHLORIDE 0.9 % IV SOLN
2.0000 g | Freq: Every day | INTRAVENOUS | Status: DC
  Filled 2021-02-07: qty 20

## 2021-02-07 MED ORDER — ORAL CARE MOUTH RINSE
15.0000 mL | Freq: Two times a day (BID) | OROMUCOSAL | Status: DC
  Administered 2021-02-07 – 2021-02-09 (×5): 15 mL via OROMUCOSAL

## 2021-02-07 MED ORDER — ACETAMINOPHEN 325 MG PO TABS
650.0000 mg | ORAL_TABLET | Freq: Four times a day (QID) | ORAL | Status: DC | PRN
  Administered 2021-02-07: 650 mg via ORAL
  Filled 2021-02-07: qty 2

## 2021-02-07 MED ORDER — LACTATED RINGERS IV SOLN: INTRAVENOUS | Status: DC

## 2021-02-07 MED ORDER — INSULIN REGULAR(HUMAN) IN NACL 100-0.9 UT/100ML-% IV SOLN
INTRAVENOUS | Status: DC
  Administered 2021-02-07: 5 [IU]/h via INTRAVENOUS
  Filled 2021-02-07: qty 100

## 2021-02-07 MED ORDER — LACTATED RINGERS IV BOLUS
1000.0000 mL | Freq: Once | INTRAVENOUS | Status: AC
  Administered 2021-02-07: 1000 mL via INTRAVENOUS

## 2021-02-07 MED ORDER — STERILE WATER FOR INJECTION IV SOLN
INTRAVENOUS | Status: DC
  Filled 2021-02-07: qty 1000
  Filled 2021-02-07: qty 150
  Filled 2021-02-07: qty 1000

## 2021-02-07 MED ORDER — ONDANSETRON HCL 4 MG/2ML IJ SOLN
4.0000 mg | Freq: Three times a day (TID) | INTRAMUSCULAR | Status: DC | PRN
  Administered 2021-02-07: 4 mg via INTRAVENOUS
  Filled 2021-02-07: qty 2

## 2021-02-07 MED ORDER — INSULIN REGULAR(HUMAN) IN NACL 100-0.9 UT/100ML-% IV SOLN: INTRAVENOUS | Status: DC

## 2021-02-07 NOTE — ED Notes (Signed)
Per intensivist, patient will be staying here and will not be getting transferred.
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
## 2021-02-07 NOTE — Progress Notes (Signed)
A consult was placed to IV Therapy for another iv site;  pt had been seen by IV Team during the night;  she currently has an 18 ga in the LUA, which was placed with ultrasound;  both arms assessed again this am; no suitable veins noted for more peripheral access; pt on multiple meds, insulin, and drips; suggest a picc line for her;  RN aware.

## 2021-02-07 NOTE — ED Notes (Signed)
[facility] contacted for transport  

## 2021-02-07 NOTE — Progress Notes (Addendum)
° °NAME:  Dawn [name], MRN:  [id number], DOB:  06/28/2000, LOS: 0 °ADMISSION DATE:  02/07/2021, CONSULTATION DATE:  02/07/21 °REFERRING MD:  Sanders CHIEF COMPLAINT:  DKA  ° °History of Present Illness:  °Dawn [name] is a 20 year woman with DMI, asthma, migraines and anxiety who is admitted for DKA. She reports having some fevers and chills prior to admission with cough and sputum production. She is also having mild abdominal discomfort, denies any nausea or diarrhea.  ° °Initial blood sugar was greater than 600. ABG showed pH 6.872, pCO 25, pO2 52. UA showed ketones present. Lipase not elevated. Procal 0.62. Cr 1.46.  ° °She was given 2L NS in ER. Started on insulin drip and LR maintenance fluids.  ° °Pertinent  Medical History  ° °DMI with episodes of DKA °Asthma °Migraines ° °Significant Hospital Events: °Including procedures, antibiotic start and stop dates in addition to other pertinent events   °2/5 admitted to ICU for DKA ° °Interim History / Subjective:  ° °No acute events since admission. Patient complaining of generalized abdominal pain without nausea. She is also having headache. ° °Objective   °Blood pressure 108/88, pulse (!) 123, temperature 98.8 °F (37.1 °C), temperature source Oral, resp. rate (!) 28, height 5' 1" (1.549 m), weight 47.6 kg, last menstrual period 01/21/2021, SpO2 100 %. °   °   ° °Intake/Output Summary (Last 24 hours) at 02/07/2021 0801 °Last data filed at 02/07/2021 0747 °Gross per 24 hour  °Intake 257.31 ml  °Output 450 ml  °Net -192.69 ml  ° °Filed Weights  ° 02/07/21 0034 02/07/21 0035  °Weight: 48 kg 47.6 kg  ° ° °Examination: °General: young woman, mild distress, acutely ill appearing °HENT: [place]/AT, dry mucous membranes, sclera anicteric °Lungs: clear to auscultation, unable to talk in full sentences, no wheezing °Cardiovascular: tachycardic, s1s2, no murmurs °Abdomen: soft, non-tender, non-distended, diminished bowel sounds °Extremities: cool to touch, no edema °Neuro: alert, moving  all extremities but lethargic °GU: deferred ° °Resolved Hospital Problem list   ° ° °Assessment & Plan:  °Diabetic Ketoacidosis °Diabetes Mellitus Type I °- Appears to be in setting of missed insulin dosing.  °- Empiric levaquin coverage for now °- Lipase negative, UA not concerning for UTI °- Chest x-ray pending °- Continue DKA protocol with insulin gtt and maitenance fluids °- Will give extra bolus of LR now °- She is on bicarb gtt which will be discontinued later this morning ° °Acute Kidney Injury °Pseudohyponatremia °Hyperkalemia °- Aggressive fluid resuscitation °- monitor Cr, Na and K ° °Migraines/Headache °- PRN tylenol ° °Elevated Alk Phos °- monitor ° °Leukocytosis °Stress reaction in DKA vs infection °- empiric levaquin coverage °- monitor ° °Best Practice (right click and "Reselect all SmartList Selections" daily)  ° °Diet/type: NPO w/ oral meds °DVT prophylaxis: LMWH °GI prophylaxis: N/A °Lines: N/A °Foley:  N/A °Code Status:  full code °Last date of multidisciplinary goals of care discussion [n/a] ° °Labs   °CBC: °Recent Labs  °Lab 02/07/21 °0224 02/07/21 °0627  °WBC 20.5* 25.5*  °NEUTROABS 13.9*  --   °HGB 14.1 12.6  °HCT 47.6* 41.9  °MCV 98.6 98.6  °PLT 434* 273  ° ° °Basic Metabolic Panel: °Recent Labs  °Lab 02/07/21 °0224  °NA 129*  °K 5.9*  °CL 96*  °CO2 <7*  °GLUCOSE 799*  °BUN 26*  °CREATININE 1.46*  °CALCIUM 9.4  ° °GFR: °Estimated Creatinine Clearance: 46.6 mL/min (A) (by C-G formula based on SCr of 1.46 mg/dL (H)). °Recent Labs  °  Lab 02/07/21 0224 02/07/21 0415 02/07/21 0627  PROCALCITON  --  0.62  --   WBC 20.5*  --  25.5*    Liver Function Tests: Recent Labs  Lab 02/07/21 0224  AST 34  ALT 27  ALKPHOS 173*  BILITOT 1.0  PROT 9.4*  ALBUMIN 4.7   Recent Labs  Lab 02/07/21 0415  LIPASE 32   No results for input(s): AMMONIA in the last 168 hours.  ABG    Component Value Date/Time   PHART 7.075 (LL) 01/03/2021 0525   PCO2ART <19.0 (LL) 01/03/2021 0525   PO2ART  <31.0 (LL) 01/03/2021 0525   HCO3 5.4 (L) 02/07/2021 0627   TCO2 <5 (L) 01/03/2021 0254   ACIDBASEDEF 26.9 (H) 02/07/2021 0627   O2SAT 66.4 02/07/2021 0627     Coagulation Profile: No results for input(s): INR, PROTIME in the last 168 hours.  Cardiac Enzymes: No results for input(s): CKTOTAL, CKMB, CKMBINDEX, TROPONINI in the last 168 hours.  HbA1C: HbA1c, POC (controlled diabetic range)  Date/Time Value Ref Range Status  08/18/2020 03:32 PM 14.7 (A) 0.0 - 7.0 % Final   HbA1c POC (<> result, manual entry)  Date/Time Value Ref Range Status  06/26/2020 10:12 AM >15.0 4.0 - 5.6 % Final  02/19/2020 10:10 AM >15.0 4.0 - 5.6 % Final   Hgb A1c MFr Bld  Date/Time Value Ref Range Status  02/07/2021 02:24 AM 14.0 (H) 4.8 - 5.6 % Final    Comment:    (NOTE) Pre diabetes:          5.7%-6.4%  Diabetes:              >6.4%  Glycemic control for   <7.0% adults with diabetes   01/03/2021 04:43 AM >15.5 (H) 4.8 - 5.6 % Final    Comment:    (NOTE) **Verified by repeat analysis**         Prediabetes: 5.7 - 6.4         Diabetes: >6.4         Glycemic control for adults with diabetes: <7.0     CBG: Recent Labs  Lab 02/07/21 0438 02/07/21 0516 02/07/21 0550 02/07/21 0629 02/07/21 0725  GLUCAP 561* 449* 425* 368* 348*     Critical care time: 40 minutes    Freda Jackson, MD [facility] Office: [phone]   See Amion for personal pager PCCM on call pager [phone] until 7pm. Please call Elink 7p-7a. [phone]

## 2021-02-07 NOTE — ED Triage Notes (Signed)
Patient BIB [facility] from home. Had sex a month ago, has missed her period, worried she is pregnant. Having a panic attack because of this.

## 2021-02-07 NOTE — ED Provider Notes (Signed)
[facility] DEPT Provider Note   CSN: [id number] Arrival date & time: 02/07/21  0022     History  Chief Complaint  Patient presents with   Hyperglycemia    Dawn [name] is a 21 y.o. female.  The history is provided by the patient and medical records.   21 year old female with history of poorly controlled type 1 diabetes with frequent DKA admissions, migraine headaches, asthma, presenting to the ED initially for concern of pregnancy.  Had unprotected sex last month and period is a few days late.  She did recently have STD testing with PCP was found to have chlamydia, she was treated with course of doxycycline.    Patient admits she has not been taking her insulin today, but reports taking her medications as normal yesterday.  CBG in triage reading "high".  Home Medications Prior to Admission medications   Medication Sig Start Date End Date Taking? Authorizing Provider  Accu-Chek Softclix Lancets lancets Please use to check blood sugar up to 6 times/day. 07/01/19   [name], MD  acetaminophen (TYLENOL) 500 MG tablet Take 500 mg by mouth every 6 (six) hours as needed for mild pain.    [provider]  albuterol (PROVENTIL) (2.5 MG/3ML) 0.083% nebulizer solution TAKE 3 MLS BY NEBULIZER EVERY 6 HOURS AS NEEDED Patient taking differently: Take 3 mLs by nebulization every 6 (six) hours as needed for wheezing. 11/30/20   [name], MD  Blood Glucose Monitoring Suppl (ACCU-CHEK GUIDE) w/Device KIT 1 kit by Does not apply route daily as needed. 07/01/19   [name], MD  Continuous Blood Gluc Sensor (FREESTYLE LIBRE 2 SENSOR) MISC Change sensor every 14 days. 11/07/19   Sherrlyn Hock, MD  Continuous Blood Gluc Transmit (DEXCOM G6 TRANSMITTER) MISC See admin instructions. Patient not taking: Reported on 01/08/2021 07/22/20   [provider]  glucagon 1 MG injection Inject 1 mg IM for severe hypoglycemia 01/08/21   Eppie Gibson, MD   glucose blood (ACCU-CHEK GUIDE) test strip Use to check glucose 6x daily 08/17/20   Dickie La, MD  insulin glargine (LANTUS) 100 UNIT/ML injection Inject 0.28 mLs (28 Units total) into the skin at bedtime. 11/28/20   Lyndee Hensen, DO  insulin lispro (HUMALOG KWIKPEN) 100 UNIT/ML KwikPen Inject 10 Units into the skin with breakfast, with lunch, and with evening meal. 07/13/20 01/09/21  Ezequiel Essex, MD  Insulin Pen Needle (BD PEN NEEDLE NANO 2ND GEN) 32G X 4 MM MISC USE TO INJECT INSULIN 6 TIMES DAILY 08/26/19   [name], MD  Insulin Syringe-Needle U-100 (INSULIN SYRINGE .5CC/30GX1/2") 30G X 1/2" 0.5 ML MISC Use to draw up Novolog three times daily. 06/08/20   Sharion Settler, DO  Insulin Syringe-Needle U-100 (INSULIN SYRINGE 1CC/30GX1/2") 30G X 1/2" 1 ML MISC Use to draw up Lantus daily. 06/08/20   Sharion Settler, DO  metroNIDAZOLE (FLAGYL) 500 MG tablet Take 1 tablet (500 mg total) by mouth 2 (two) times daily. 01/25/21   Sharion Settler, DO  sertraline (ZOLOFT) 50 MG tablet Take 1.5 tablets (75 mg total) by mouth daily. 07/01/20   [name], MD  SUMAtriptan (IMITREX) 25 MG tablet Take 0.5 tablets (12.5 mg total) by mouth every 2 (two) hours as needed for migraine. May repeat in 2 hours if headache persists or recurs. 01/08/21   Eppie Gibson, MD      Allergies    Shellfish allergy    Review of Systems   Review of Systems  Psychiatric/Behavioral:  The patient is nervous/anxious.   All other systems reviewed and are negative.  Physical Exam Updated Vital Signs BP (!) 148/98 (BP Location: Left Arm)    Pulse (!) 122    Temp 98.8 F (37.1 C) (Oral)    Resp (!) 23    Ht _0  (1.549 m)    Wt 47.6 kg    LMP 01/21/2021    SpO2 97%    BMI 19.84 kg/m   Physical Exam Vitals and nursing note reviewed.  Constitutional:      Appearance: She is well-developed.  HENT:     Head: Normocephalic and atraumatic.  Eyes:     Conjunctiva/sclera: Conjunctivae normal.     Pupils: Pupils  are equal, round, and reactive to light.  Cardiovascular:     Rate and Rhythm: Normal rate and regular rhythm.     Heart sounds: Normal heart sounds.  Pulmonary:     Effort: Pulmonary effort is normal. Tachypnea present. No respiratory distress.     Breath sounds: Normal breath sounds. No rhonchi.     Comments: Kussmaul respirations Abdominal:     General: Bowel sounds are normal.     Palpations: Abdomen is soft.  Musculoskeletal:        General: Normal range of motion.     Cervical back: Normal range of motion.  Skin:    General: Skin is warm and dry.  Neurological:     Mental Status: She is alert and oriented to person, place, and time.    ED Results / Procedures / Treatments   Labs (all labs ordered are listed, but only abnormal results are displayed) Labs Reviewed  CBC WITH DIFFERENTIAL/PLATELET - Abnormal; Notable for the following components:      Result Value   WBC 20.5 (*)    HCT 47.6 (*)    MCHC 29.6 (*)    Platelets 434 (*)    Neutro Abs 13.9 (*)    Lymphs Abs 5.3 (*)    Abs Immature Granulocytes 0.31 (*)    All other components within normal limits  COMPREHENSIVE METABOLIC PANEL - Abnormal; Notable for the following components:   Sodium 129 (*)    Potassium 5.9 (*)    Chloride 96 (*)    CO2 <7 (*)    Glucose, Bld 799 (*)    BUN 26 (*)    Creatinine, Ser 1.46 (*)    Total Protein 9.4 (*)    Alkaline Phosphatase 173 (*)    GFR, Estimated 53 (*)    All other components within normal limits  URINALYSIS, ROUTINE W REFLEX MICROSCOPIC - Abnormal; Notable for the following components:   Color, Urine YELLOW (*)    APPearance CLEAR (*)    Glucose, UA >1,000 (*)    Ketones, ur >80 (*)    Protein, ur TRACE (*)    All other components within normal limits  BETA-HYDROXYBUTYRIC ACID - Abnormal; Notable for the following components:   Beta-Hydroxybutyric Acid >8.00 (*)    All other components within normal limits  BLOOD GAS, VENOUS - Abnormal; Notable for the  following components:   pH, Ven 6.872 (*)    pCO2, Ven 25.3 (*)    pO2, Ven 52.0 (*)    Bicarbonate 4.4 (*)    Acid-base deficit 29.7 (*)    All other components within normal limits  CBG MONITORING, ED - Abnormal; Notable for the following components:   Glucose-Capillary >600 (*)    All other components within normal limits  CBG  MONITORING, ED - Abnormal; Notable for the following components:   Glucose-Capillary >600 (*)    All other components within normal limits  RESP PANEL BY RT-PCR (FLU A&B, COVID) ARPGX2  HEMOGLOBIN A1C  LIPASE, BLOOD  PROCALCITONIN  CBC  BASIC METABOLIC PANEL  BASIC METABOLIC PANEL  BASIC METABOLIC PANEL  BASIC METABOLIC PANEL  BASIC METABOLIC PANEL  BETA-HYDROXYBUTYRIC ACID  BETA-HYDROXYBUTYRIC ACID  BETA-HYDROXYBUTYRIC ACID  BLOOD GAS, VENOUS  POC URINE PREG, ED    EKG EKG Interpretation  Date/Time:  Sunday February 07 2021 01:24:00 EST Ventricular Rate:  114 PR Interval:  115 QRS Duration: 81 QT Interval:  348 QTC Calculation: 480 R Axis:   75 Text Interpretation: Age not entered, assumed to be  21 years old for purpose of ECG interpretation Sinus tachycardia Biatrial enlargement Probable left ventricular hypertrophy Borderline prolonged QT interval Confirmed by Addison Lank [phone]) on 02/07/2021 2:14:40 AM  Radiology No results found.  Procedures Procedures    CRITICAL CARE Performed by: Larene Pickett   Total critical care time: 45 minutes  Critical care time was exclusive of separately billable procedures and treating other patients.  Critical care was necessary to treat or prevent imminent or life-threatening deterioration.  Critical care was time spent personally by me on the following activities: development of treatment plan with patient and/or surrogate as well as nursing, discussions with consultants, evaluation of patient's response to treatment, examination of patient, obtaining history from patient or surrogate,  ordering and performing treatments and interventions, ordering and review of laboratory studies, ordering and review of radiographic studies, pulse oximetry and re-evaluation of patient's condition.   Medications Ordered in ED Medications  lactated ringers infusion (has no administration in time range)  dextrose 5 % in lactated ringers infusion (has no administration in time range)  sodium bicarbonate 150 mEq in sterile water 1,150 mL infusion (has no administration in time range)  enoxaparin (LOVENOX) injection 40 mg (has no administration in time range)  insulin regular, human (MYXREDLIN) 100 units/ 100 mL infusion (has no administration in time range)  lactated ringers infusion (has no administration in time range)  dextrose 5 % in lactated ringers infusion (has no administration in time range)  dextrose 50 % solution 0-50 mL (has no administration in time range)  sodium chloride 0.9 % bolus 1,000 mL (1,000 mLs Intravenous New Bag/Given 02/07/21 0146)  sodium chloride 0.9 % bolus 1,000 mL (1,000 mLs Intravenous New Bag/Given 02/07/21 0308)    ED Course/ Medical Decision Making/ A&P                           Medical Decision Making Amount and/or Complexity of Data Reviewed External Data Reviewed: labs and radiology. Labs: ordered. ECG/medicine tests: ordered and independent interpretation performed.  Risk Prescription drug management. Decision regarding hospitalization.   21 year old female presenting to the ED with reported anxiety.  Apparently she had unprotected sex last month and period is a few days late.  On initial evaluation, patient is tachypneic with Kussmaul respirations.  She is a known type I diabetic, poorly controlled.  Admits she has not taken her insulin today.  CBG obtained from triage reading "high".  Suspect recurrent DKA, many admissions for same in the past.  We will plan for labs, EKG, VBG, covid/flu screen.  Will start IVFB.  Labs above are consistent with  severe DKA-- pH 6.872, bicarb <7, glucose 799, urine with 80 ketones.  Has had 2L IVFB, will  start insulin drip and bicarb drip.  WBC count 20.5 but no fever or other systemic symptoms concerning for infection at this time.  Feel she will require ICU admission.  Discussed with critical care, Dr. Duwayne Heck-- has evaluated in the ED and will admit for ongoing care.  Final Clinical Impression(s) / ED Diagnoses Final diagnoses:  Diabetic ketoacidosis without coma associated with type 1 diabetes mellitus [facility])    Rx / DC Orders ED Discharge Orders     None         Larene Pickett, PA-C 02/07/21 0424    Fatima Blank, MD 02/07/21 [phone]

## 2021-02-07 NOTE — Progress Notes (Addendum)
eLink Physician-Brief Progress Note Patient Name: Dawn [name] DOB: 2000-06-27 MRN: [id number]   Date of Service  02/07/2021  HPI/Events of Note  Patient was on insulin drip and d5 LR. Sign out says to check BMP every 4 hours. I noted last BMP was at 6 pm. Improving bicarb, K is 3.4. I clarified with RN and this was not repleted. Also order for BMP is expiring with next one to check at 8 pm which would be too soon.   eICU Interventions  40 meq oral potassium x 1 now  Continue D5 with LR - not adding additional K since patient is getting K repleted now Ordered next check at 11 pm - BMP and mag and asked RN to let us know when resulted     Intervention Category Major Interventions: Electrolyte abnormality - evaluation and management  [name] 02/07/2021, 7:27 PM  Addendum at 12:55 am Notified of BMP K improved to 3.9 Still acidotic and with elevated beta hydroxy levels so continue IV insulin Change fluids to d5 1/2 ns with 20 meq Kcl at same rate I ordered next BMP at 3 am, along with morning CBC  Then continue Q4 BMP and the system discontinued prophylactic lovenox order so I placed it again Notify us when 3 am labs result   Addendum at 4:55 am K is 3.4, phos is 1.8 15 mmol IV K phos x 1 Continue IV insulin since bicarb is just improved to 16, and beta hydroxybutyrate levels were high at 9 pm - next check at 7 am

## 2021-02-08 DIAGNOSIS — G43909 Migraine, unspecified, not intractable, without status migrainosus: Secondary | ICD-10-CM

## 2021-02-08 DIAGNOSIS — E0810 Diabetes mellitus due to underlying condition with ketoacidosis without coma: Secondary | ICD-10-CM

## 2021-02-08 DIAGNOSIS — D72829 Elevated white blood cell count, unspecified: Secondary | ICD-10-CM

## 2021-02-08 DIAGNOSIS — E875 Hyperkalemia: Secondary | ICD-10-CM

## 2021-02-08 DIAGNOSIS — R748 Abnormal levels of other serum enzymes: Secondary | ICD-10-CM

## 2021-02-08 DIAGNOSIS — N179 Acute kidney failure, unspecified: Secondary | ICD-10-CM

## 2021-02-08 LAB — GLUCOSE, CAPILLARY
Glucose-Capillary: 118 mg/dL — ABNORMAL HIGH (ref 70–99)
Glucose-Capillary: 155 mg/dL — ABNORMAL HIGH (ref 70–99)
Glucose-Capillary: 162 mg/dL — ABNORMAL HIGH (ref 70–99)
Glucose-Capillary: 162 mg/dL — ABNORMAL HIGH (ref 70–99)
Glucose-Capillary: 162 mg/dL — ABNORMAL HIGH (ref 70–99)
Glucose-Capillary: 166 mg/dL — ABNORMAL HIGH (ref 70–99)
Glucose-Capillary: 179 mg/dL — ABNORMAL HIGH (ref 70–99)
Glucose-Capillary: 197 mg/dL — ABNORMAL HIGH (ref 70–99)
Glucose-Capillary: 198 mg/dL — ABNORMAL HIGH (ref 70–99)
Glucose-Capillary: 207 mg/dL — ABNORMAL HIGH (ref 70–99)
Glucose-Capillary: 209 mg/dL — ABNORMAL HIGH (ref 70–99)
Glucose-Capillary: 233 mg/dL — ABNORMAL HIGH (ref 70–99)
Glucose-Capillary: 282 mg/dL — ABNORMAL HIGH (ref 70–99)
Glucose-Capillary: 311 mg/dL — ABNORMAL HIGH (ref 70–99)
Glucose-Capillary: 318 mg/dL — ABNORMAL HIGH (ref 70–99)
Glucose-Capillary: 320 mg/dL — ABNORMAL HIGH (ref 70–99)
Glucose-Capillary: 322 mg/dL — ABNORMAL HIGH (ref 70–99)

## 2021-02-08 LAB — CBC WITH DIFFERENTIAL/PLATELET
Abs Immature Granulocytes: 0.03 10*3/uL (ref 0.00–0.07)
Basophils Absolute: 0 10*3/uL (ref 0.0–0.1)
Basophils Relative: 0 %
Eosinophils Absolute: 0.1 10*3/uL (ref 0.0–0.5)
Eosinophils Relative: 1 %
HCT: 34.4 % — ABNORMAL LOW (ref 36.0–46.0)
Hemoglobin: 11.2 g/dL — ABNORMAL LOW (ref 12.0–15.0)
Immature Granulocytes: 0 %
Lymphocytes Relative: 38 %
Lymphs Abs: 4.6 10*3/uL — ABNORMAL HIGH (ref 0.7–4.0)
MCH: 29 pg (ref 26.0–34.0)
MCHC: 32.6 g/dL (ref 30.0–36.0)
MCV: 89.1 fL (ref 80.0–100.0)
Monocytes Absolute: 0.5 10*3/uL (ref 0.1–1.0)
Monocytes Relative: 4 %
Neutro Abs: 6.9 10*3/uL (ref 1.7–7.7)
Neutrophils Relative %: 57 %
Platelets: 295 10*3/uL (ref 150–400)
RBC: 3.86 MIL/uL — ABNORMAL LOW (ref 3.87–5.11)
RDW: 13.9 % (ref 11.5–15.5)
WBC: 12.1 10*3/uL — ABNORMAL HIGH (ref 4.0–10.5)
nRBC: 0 % (ref 0.0–0.2)

## 2021-02-08 LAB — BLOOD GAS, VENOUS
Acid-base deficit: 9.2 mmol/L — ABNORMAL HIGH (ref 0.0–2.0)
Bicarbonate: 14.5 mmol/L — ABNORMAL LOW (ref 20.0–28.0)
O2 Saturation: 72.5 %
Patient temperature: 98.6
pCO2, Ven: 26.1 mmHg — ABNORMAL LOW (ref 44.0–60.0)
pH, Ven: 7.362 (ref 7.250–7.430)
pO2, Ven: 38.4 mmHg (ref 32.0–45.0)

## 2021-02-08 LAB — PHOSPHORUS: Phosphorus: 1.5 mg/dL — ABNORMAL LOW (ref 2.5–4.6)

## 2021-02-08 LAB — BASIC METABOLIC PANEL
Anion gap: 13 (ref 5–15)
Anion gap: 10 (ref 5–15)
Anion gap: 8 (ref 5–15)
Anion gap: 11 (ref 5–15)
Anion gap: 10 (ref 5–15)
Anion gap: 9 (ref 5–15)
Anion gap: 11 (ref 5–15)
BUN: 9 mg/dL (ref 6–20)
BUN: 8 mg/dL (ref 6–20)
BUN: 6 mg/dL (ref 6–20)
BUN: 6 mg/dL (ref 6–20)
BUN: 6 mg/dL (ref 6–20)
BUN: 9 mg/dL (ref 6–20)
BUN: 11 mg/dL (ref 6–20)
CO2: 13 mmol/L — ABNORMAL LOW (ref 22–32)
CO2: 16 mmol/L — ABNORMAL LOW (ref 22–32)
CO2: 19 mmol/L — ABNORMAL LOW (ref 22–32)
CO2: 17 mmol/L — ABNORMAL LOW (ref 22–32)
CO2: 19 mmol/L — ABNORMAL LOW (ref 22–32)
CO2: 22 mmol/L (ref 22–32)
CO2: 21 mmol/L — ABNORMAL LOW (ref 22–32)
Calcium: 8.6 mg/dL — ABNORMAL LOW (ref 8.9–10.3)
Calcium: 8.2 mg/dL — ABNORMAL LOW (ref 8.9–10.3)
Calcium: 8.3 mg/dL — ABNORMAL LOW (ref 8.9–10.3)
Calcium: 8.4 mg/dL — ABNORMAL LOW (ref 8.9–10.3)
Calcium: 8.2 mg/dL — ABNORMAL LOW (ref 8.9–10.3)
Calcium: 8.5 mg/dL — ABNORMAL LOW (ref 8.9–10.3)
Calcium: 9 mg/dL (ref 8.9–10.3)
Chloride: 107 mmol/L (ref 98–111)
Chloride: 108 mmol/L (ref 98–111)
Chloride: 108 mmol/L (ref 98–111)
Chloride: 107 mmol/L (ref 98–111)
Chloride: 105 mmol/L (ref 98–111)
Chloride: 104 mmol/L (ref 98–111)
Chloride: 106 mmol/L (ref 98–111)
Creatinine, Ser: 0.59 mg/dL (ref 0.44–1.00)
Creatinine, Ser: 0.48 mg/dL (ref 0.44–1.00)
Creatinine, Ser: 0.47 mg/dL (ref 0.44–1.00)
Creatinine, Ser: 0.58 mg/dL (ref 0.44–1.00)
Creatinine, Ser: 0.78 mg/dL (ref 0.44–1.00)
Creatinine, Ser: 0.61 mg/dL (ref 0.44–1.00)
Creatinine, Ser: 0.59 mg/dL (ref 0.44–1.00)
GFR, Estimated: >60 mL/min (ref >60)
GFR, Estimated: >60 mL/min (ref >60)
GFR, Estimated: >60 mL/min (ref >60)
GFR, Estimated: >60 mL/min (ref >60)
GFR, Estimated: >60 mL/min (ref >60)
GFR, Estimated: >60 mL/min (ref >60)
GFR, Estimated: >60 mL/min (ref >60)
Glucose, Bld: 184 mg/dL — ABNORMAL HIGH (ref 70–99)
Glucose, Bld: 150 mg/dL — ABNORMAL HIGH (ref 70–99)
Glucose, Bld: 107 mg/dL — ABNORMAL HIGH (ref 70–99)
Glucose, Bld: 186 mg/dL — ABNORMAL HIGH (ref 70–99)
Glucose, Bld: 333 mg/dL — ABNORMAL HIGH (ref 70–99)
Glucose, Bld: 308 mg/dL — ABNORMAL HIGH (ref 70–99)
Glucose, Bld: 223 mg/dL — ABNORMAL HIGH (ref 70–99)
Potassium: 3.9 mmol/L (ref 3.5–5.1)
Potassium: 3.4 mmol/L — ABNORMAL LOW (ref 3.5–5.1)
Potassium: 3.2 mmol/L — ABNORMAL LOW (ref 3.5–5.1)
Potassium: 3.8 mmol/L (ref 3.5–5.1)
Potassium: 3.8 mmol/L (ref 3.5–5.1)
Potassium: 3.7 mmol/L (ref 3.5–5.1)
Potassium: 3.6 mmol/L (ref 3.5–5.1)
Sodium: 133 mmol/L — ABNORMAL LOW (ref 135–145)
Sodium: 134 mmol/L — ABNORMAL LOW (ref 135–145)
Sodium: 135 mmol/L (ref 135–145)
Sodium: 135 mmol/L (ref 135–145)
Sodium: 134 mmol/L — ABNORMAL LOW (ref 135–145)
Sodium: 135 mmol/L (ref 135–145)
Sodium: 138 mmol/L (ref 135–145)

## 2021-02-08 LAB — BETA-HYDROXYBUTYRIC ACID
Beta-Hydroxybutyric Acid: 1.29 mmol/L — ABNORMAL HIGH (ref 0.05–0.27)
Beta-Hydroxybutyric Acid: 2.4 mmol/L — ABNORMAL HIGH (ref 0.05–0.27)

## 2021-02-08 LAB — MAGNESIUM: Magnesium: 2 mg/dL (ref 1.7–2.4)

## 2021-02-08 MED ORDER — INSULIN ASPART 100 UNIT/ML IJ SOLN: 2.0000 [IU] | INTRAMUSCULAR | Status: DC

## 2021-02-08 MED ORDER — ENOXAPARIN SODIUM 40 MG/0.4ML IJ SOSY
40.0000 mg | PREFILLED_SYRINGE | INTRAMUSCULAR | Status: DC
  Filled 2021-02-08 (×2): qty 0.4

## 2021-02-08 MED ORDER — INSULIN DETEMIR 100 UNIT/ML ~~LOC~~ SOLN
5.0000 [IU] | Freq: Two times a day (BID) | SUBCUTANEOUS | Status: DC
  Administered 2021-02-08: 5 [IU] via SUBCUTANEOUS
  Filled 2021-02-08 (×2): qty 0.05

## 2021-02-08 MED ORDER — INSULIN DETEMIR 100 UNIT/ML ~~LOC~~ SOLN
15.0000 [IU] | Freq: Once | SUBCUTANEOUS | Status: AC
  Administered 2021-02-08: 15 [IU] via SUBCUTANEOUS
  Filled 2021-02-08: qty 0.15

## 2021-02-08 MED ORDER — KCL IN DEXTROSE-NACL 20-5-0.45 MEQ/L-%-% IV SOLN
INTRAVENOUS | Status: DC
  Filled 2021-02-08 (×2): qty 1000

## 2021-02-08 MED ORDER — INSULIN ASPART 100 UNIT/ML IJ SOLN
0.0000 [IU] | INTRAMUSCULAR | Status: DC
  Administered 2021-02-08 (×2): 15 [IU] via SUBCUTANEOUS
  Administered 2021-02-08: 4 [IU] via SUBCUTANEOUS
  Administered 2021-02-09: 7 [IU] via SUBCUTANEOUS
  Administered 2021-02-09: 4 [IU] via SUBCUTANEOUS

## 2021-02-08 MED ORDER — POTASSIUM PHOSPHATES 15 MMOLE/5ML IV SOLN
30.0000 mmol | Freq: Once | INTRAVENOUS | Status: DC
  Filled 2021-02-08: qty 10

## 2021-02-08 MED ORDER — POTASSIUM CHLORIDE CRYS ER 20 MEQ PO TBCR
40.0000 meq | EXTENDED_RELEASE_TABLET | Freq: Once | ORAL | Status: DC
Start: 2021-02-08 — End: 2021-02-08

## 2021-02-08 MED ORDER — INSULIN DETEMIR 100 UNIT/ML ~~LOC~~ SOLN
20.0000 [IU] | Freq: Two times a day (BID) | SUBCUTANEOUS | Status: DC
  Administered 2021-02-08 – 2021-02-09 (×2): 20 [IU] via SUBCUTANEOUS
  Filled 2021-02-08 (×3): qty 0.2

## 2021-02-08 MED ORDER — POTASSIUM PHOSPHATES 15 MMOLE/5ML IV SOLN
15.0000 mmol | Freq: Once | INTRAVENOUS | Status: AC
  Administered 2021-02-08: 15 mmol via INTRAVENOUS
  Filled 2021-02-08: qty 5

## 2021-02-08 MED ORDER — DEXTROSE-NACL 5-0.9 % IV SOLN: INTRAVENOUS | Status: AC

## 2021-02-08 NOTE — Progress Notes (Signed)
NAMETheodore [name], MRN:  [id number], DOB:  04-13-2000, LOS: 1 ADMISSION DATE:  02/07/2021, CONSULTATION DATE:  02/07/21 REFERRING MD:  [facility] CHIEF COMPLAINT:  DKA   History of Present Illness:  Dawn [name] is a 71 year woman with DMI, asthma, migraines and anxiety who is admitted for DKA. She reports having some fevers and chills prior to admission with cough and sputum production. She is also having mild abdominal discomfort, denies any nausea or diarrhea.   Initial blood sugar was greater than 600. ABG showed pH 6.872, pCO 25, pO2 52. UA showed ketones present. Lipase not elevated. Procal 0.62. Cr 1.46.   She was given 2L NS in ER. Started on insulin drip and LR maintenance fluids.   Pertinent  Medical History   DMI with episodes of DKA Asthma Migraines  Significant Hospital Events: Including procedures, antibiotic start and stop dates in addition to other pertinent events   2/5 admitted to ICU for DKA  Interim History / Subjective:   No acute events overnight. Patient is feeling better today. Denies nausea, abdominal pain, or vomiting.    Objective   Blood pressure (!) 99/59, pulse (!) 104, temperature 98.1 F (36.7 C), temperature source Oral, resp. rate 18, height _0  (1.549 m), weight 47.6 kg, last menstrual period 01/21/2021, SpO2 98 %.        Intake/Output Summary (Last 24 hours) at 02/08/2021 0741 Last data filed at 02/08/2021 0786 Gross per 24 hour  Intake 4091.57 ml  Output 450 ml  Net 3641.57 ml   Filed Weights   02/07/21 0034 02/07/21 0035  Weight: 48 kg 47.6 kg   Examination: General: young woman, no acute distress HENT: [place]/AT, moist mucous membranes, sclera anicteric Lungs: clear to auscultation, talking in full sentences today, no wheezing Cardiovascular: rrr, s1s2, no murmurs Abdomen: soft, non-tender, non-distended, bowel sounds normal Extremities: cool to touch, no edema Neuro: alert, moving all extremities but lethargic GU:  deferred  Resolved Hospital Problem list     Assessment & Plan:  Diabetic Ketoacidosis Diabetes Mellitus Type I - Appears to be in setting of missed home insulin dosing.  - stop levaquin, less concern for infection - Lipase negative, UA not concerning for UTI - Continue DKA protocol with insulin gtt and maitenance fluids - Will likely be able to transition to subq insulin after the next lab draw around noon and resume diabetic diet  Acute Kidney Injury Pseudohyponatremia Hyperkalemia - renal function improving, back to baseline - monitor Cr, Na and K - K being repleted this morning  Migraines/Headache - PRN tylenol  Elevated Alk Phos - monitor  Leukocytosis Stress reaction in DKA vs infection - improving, less concern for infection. Stop levaquin  Best Practice (right click and "Reselect all SmartList Selections" daily)   Diet/type: NPO w/ oral meds DVT prophylaxis: LMWH GI prophylaxis: N/A Lines: N/A Foley:  N/A Code Status:  full code Last date of multidisciplinary goals of care discussion [n/a]  Labs   CBC: Recent Labs  Lab 02/07/21 0224 02/07/21 0627 02/08/21 0249  WBC 20.5* 25.5* 12.1*  NEUTROABS 13.9*  --  6.9  HGB 14.1 12.6 11.2*  HCT 47.6* 41.9 34.4*  MCV 98.6 98.6 89.1  PLT 434* 273 754    Basic Metabolic Panel: Recent Labs  Lab 02/07/21 0224 02/07/21 1120 02/07/21 1801 02/07/21 2312 02/08/21 0249  NA 129* 136 137 133* 134*  K 5.9* 4.2 3.4* 3.9 3.4*  CL 96* 109 108 107 108  CO2 <7* 9* 17*  13* 16*  GLUCOSE 799* 196* 168* 184* 150*  BUN 26* _0 CREATININE 1.46* 0.68 0.54 0.59 0.48  CALCIUM 9.4 7.9* 8.1* 8.6* 8.2*  MG  --   --   --  2.0  --   PHOS  --   --   --   --  1.5*   GFR: Estimated Creatinine Clearance: 85 mL/min (by C-G formula based on SCr of 0.48 mg/dL). Recent Labs  Lab 02/07/21 0224 02/07/21 0415 02/07/21 0627 02/08/21 0249  PROCALCITON  --  0.62  --   --   WBC 20.5*  --  25.5* 12.1*    Liver Function  Tests: Recent Labs  Lab 02/07/21 0224  AST 34  ALT 27  ALKPHOS 173*  BILITOT 1.0  PROT 9.4*  ALBUMIN 4.7   Recent Labs  Lab 02/07/21 0415  LIPASE 32   No results for input(s): AMMONIA in the last 168 hours.  ABG    Component Value Date/Time   PHART 7.371 02/07/2021 1338   PCO2ART 22.0 (L) 02/07/2021 1338   PO2ART 110 (H) 02/07/2021 1338   HCO3 14.5 (L) 02/07/2021 2312   TCO2 <5 (L) 01/03/2021 0254   ACIDBASEDEF 9.2 (H) 02/07/2021 2312   O2SAT 72.5 02/07/2021 2312     Coagulation Profile: No results for input(s): INR, PROTIME in the last 168 hours.  Cardiac Enzymes: No results for input(s): CKTOTAL, CKMB, CKMBINDEX, TROPONINI in the last 168 hours.  HbA1C: HbA1c, POC (controlled diabetic range)  Date/Time Value Ref Range Status  08/18/2020 03:32 PM 14.7 (A) 0.0 - 7.0 % Final   HbA1c POC (<> result, manual entry)  Date/Time Value Ref Range Status  06/26/2020 10:12 AM >15.0 4.0 - 5.6 % Final  02/19/2020 10:10 AM >15.0 4.0 - 5.6 % Final   Hgb A1c MFr Bld  Date/Time Value Ref Range Status  02/07/2021 02:24 AM 14.0 (H) 4.8 - 5.6 % Final    Comment:    (NOTE) Pre diabetes:          5.7%-6.4%  Diabetes:              >6.4%  Glycemic control for   <7.0% adults with diabetes   01/03/2021 04:43 AM >15.5 (H) 4.8 - 5.6 % Final    Comment:    (NOTE) **Verified by repeat analysis**         Prediabetes: 5.7 - 6.4         Diabetes: >6.4         Glycemic control for adults with diabetes: <7.0     CBG: Recent Labs  Lab 02/07/21 2258 02/08/21 0009 02/08/21 0057 02/08/21 0202 02/08/21 0357  GLUCAP 207* 179* 162* 155* 162*     Critical care time: 32 minutes    Freda Jackson, MD [facility] Office: [phone]   See Amion for personal pager PCCM on call pager [phone] until 7pm. Please call Elink 7p-7a. [phone]

## 2021-02-08 NOTE — Progress Notes (Signed)
Transition of Care [facility]) Screening Note  Patient Details  Name: Dawn [name] Date of Birth: [birth year]  Transition [facility] Care [facility]) CM/SW Contact:    Sherie Don, LCSW Phone Number: 02/08/2021, 10:04 AM  Transition of Care Department [facility]) has reviewed patient and no TOC needs have been identified at this time. We will continue to monitor patient advancement through interdisciplinary progression rounds. If new patient transition needs arise, please place a TOC consult.
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
## 2021-02-09 DIAGNOSIS — E0810 Diabetes mellitus due to underlying condition with ketoacidosis without coma: Secondary | ICD-10-CM

## 2021-02-09 DIAGNOSIS — E876 Hypokalemia: Secondary | ICD-10-CM

## 2021-02-09 LAB — BASIC METABOLIC PANEL
Anion gap: 8 (ref 5–15)
Anion gap: 6 (ref 5–15)
BUN: 12 mg/dL (ref 6–20)
BUN: 10 mg/dL (ref 6–20)
CO2: 23 mmol/L (ref 22–32)
CO2: 25 mmol/L (ref 22–32)
Calcium: 8.5 mg/dL — ABNORMAL LOW (ref 8.9–10.3)
Calcium: 8.8 mg/dL — ABNORMAL LOW (ref 8.9–10.3)
Chloride: 106 mmol/L (ref 98–111)
Chloride: 109 mmol/L (ref 98–111)
Creatinine, Ser: 0.5 mg/dL (ref 0.44–1.00)
Creatinine, Ser: 0.5 mg/dL (ref 0.44–1.00)
GFR, Estimated: >60 mL/min (ref >60)
GFR, Estimated: >60 mL/min (ref >60)
Glucose, Bld: 212 mg/dL — ABNORMAL HIGH (ref 70–99)
Glucose, Bld: 76 mg/dL (ref 70–99)
Potassium: 3.3 mmol/L — ABNORMAL LOW (ref 3.5–5.1)
Potassium: 3 mmol/L — ABNORMAL LOW (ref 3.5–5.1)
Sodium: 137 mmol/L (ref 135–145)
Sodium: 140 mmol/L (ref 135–145)

## 2021-02-09 LAB — GLUCOSE, CAPILLARY
Glucose-Capillary: 197 mg/dL — ABNORMAL HIGH (ref 70–99)
Glucose-Capillary: 72 mg/dL (ref 70–99)
Glucose-Capillary: 140 mg/dL — ABNORMAL HIGH (ref 70–99)
Glucose-Capillary: 204 mg/dL — ABNORMAL HIGH (ref 70–99)
Glucose-Capillary: 246 mg/dL — ABNORMAL HIGH (ref 70–99)

## 2021-02-09 LAB — MAGNESIUM: Magnesium: 2.1 mg/dL (ref 1.7–2.4)

## 2021-02-09 LAB — PHOSPHORUS: Phosphorus: 3.1 mg/dL (ref 2.5–4.6)

## 2021-02-09 MED ORDER — POTASSIUM CHLORIDE 20 MEQ PO PACK
40.0000 meq | PACK | Freq: Once | ORAL | Status: AC
  Administered 2021-02-09: 40 meq via ORAL
  Filled 2021-02-09: qty 2

## 2021-02-09 NOTE — Progress Notes (Signed)
Discharge instructions given to patient by this RN. All questions answered. PIVs removed. Belongings returned. Patient transferred to main lobby via wheelchair by this RN. Patient ambulated safely to vehicle.

## 2021-02-09 NOTE — Discharge Summary (Signed)
Physician Discharge Summary         Patient ID: Dawn [name] MRN: [id number] DOB/AGE: 2000/04/02 21 y.o.  Admit date: 02/07/2021 Discharge date: 02/09/2021  Discharge Diagnoses:    Active Hospital Problems   Diagnosis Date Noted   DKA (diabetic ketoacidosis) ([place]) 11/26/2020    Resolved Hospital Problems  No resolved problems to display.      Discharge summary    Dawn [name] is a 57 year woman with DMI, asthma, migraines and anxiety who is admitted for DKA. She reports having some fevers and chills prior to admission with cough and sputum production. She is also having mild abdominal discomfort, denies any nausea or diarrhea.    Initial blood sugar was greater than 600. ABG showed pH 6.872, pCO 25, pO2 52. UA showed ketones present. Lipase not elevated. Procal 0.62. Cr 1.46.     She was admitted and started on an insulin gtt and IVF and admitted to ICU.  Her anion gap closed and acidosis improved and she was transitioned to long acting and sliding scale insulin on 2/6 and diet resumed.   Her initial leukocytosis and acute kidney injury improved and she was stable for discharge on 2/7  Discharge Plan by Active Problems     Type 1 Diabetes with DKA In the setting of missed home insulin dose Improved with insulin gtt and IVF and safe for discharge home  -resume home Lantus 40 units qhs and SSI  -Would likely benefit from insulin pump, follow up with Endocrinology on 2/9 at 1pm per discharge instructions   Hypokalemia Acute Kidney Injury Leukocytosis All secondary to DKA and improved prior to discharge  Significant Hospital tests/ studies  None  Procedures   None  Culture data/antimicrobials    MRSA screen> negative Covid-19 and Influenza>negative     Consults      Discharge Exam: BP 112/85    Pulse 77    Temp 98.2 F (36.8 C) (Oral)    Resp (!) 22    Ht _0  (1.549 m)    Wt 47.6 kg    LMP 01/21/2021    SpO2 100%    BMI 19.84 kg/m   General:  calm, no  distress HEENT: MM pink/moist Neuro: alert and walking around the room CV: s1s2 rrr PULM:  no distress, on room air Extremities: warm/dry, no edema  Skin: no rashes or lesions   Labs at discharge   Lab Results  Component Value Date   CREATININE 0.50 02/09/2021   BUN 10 02/09/2021   NA 140 02/09/2021   K 3.0 (L) 02/09/2021   CL 109 02/09/2021   CO2 25 02/09/2021   Lab Results  Component Value Date   WBC 12.1 (H) 02/08/2021   HGB 11.2 (L) 02/08/2021   HCT 34.4 (L) 02/08/2021   MCV 89.1 02/08/2021   PLT 295 02/08/2021   Lab Results  Component Value Date   ALT 27 02/07/2021   AST 34 02/07/2021   ALKPHOS 173 (H) 02/07/2021   BILITOT 1.0 02/07/2021   No results found for: INR, PROTIME  Current radiological studies    No results found.  Disposition:     Home    Allergies as of 02/09/2021       Reactions   Latex Other (See Comments)   Causes skin irritation   Tape Other (See Comments)   Causes skin irritation   Shellfish Allergy Rash, Other (See Comments)   Reaction to scallops        Medication  List     STOP taking these medications    metroNIDAZOLE 500 MG tablet Commonly known as: FLAGYL       TAKE these medications    Accu-Chek Guide test strip Generic drug: glucose blood Use to check glucose 6x daily   Accu-Chek Guide w/Device Kit 1 kit by Does not apply route daily as needed.   Accu-Chek Softclix Lancets lancets Please use to check blood sugar up to 6 times/day.   ProAir HFA 108 (90 Base) MCG/ACT inhaler Generic drug: albuterol Inhale 2 puffs into the lungs every 6 (six) hours as needed for wheezing or shortness of breath.   albuterol (2.5 MG/3ML) 0.083% nebulizer solution Commonly known as: PROVENTIL TAKE 3 MLS BY NEBULIZER EVERY 6 HOURS AS NEEDED   BD Pen Needle Nano 2nd Gen 32G X 4 MM Misc Generic drug: Insulin Pen Needle USE TO INJECT INSULIN 6 TIMES DAILY   Dexcom G6 Transmitter Misc See admin instructions.    diphenhydramine-acetaminophen 25-500 MG Tabs tablet Commonly known as: TYLENOL PM Take 2 tablets by mouth every 6 (six) hours as needed (for headaches).   FreeStyle Libre 2 Sensor Misc Change sensor every 14 days. What changed:  how much to take how to take this when to take this additional instructions   glucagon 1 MG injection Inject 1 mg IM for severe hypoglycemia What changed:  how much to take how to take this when to take this reasons to take this additional instructions   insulin lispro 100 UNIT/ML KwikPen Commonly known as: HumaLOG KwikPen Inject 10 Units into the skin with breakfast, with lunch, and with evening meal. What changed: when to take this   INSULIN SYRINGE 1CC/30GX1/2" 30G X 1/2" 1 ML Misc Use to draw up Lantus daily.   INSULIN SYRINGE .5CC/30GX1/2" 30G X 1/2" 0.5 ML Misc Use to draw up Novolog three times daily.   Lantus SoloStar 100 UNIT/ML Solostar Pen Generic drug: insulin glargine Inject 40 Units into the skin at bedtime. What changed: Another medication with the same name was removed. Continue taking this medication, and follow the directions you see here.   sertraline 50 MG tablet Commonly known as: ZOLOFT Take 1.5 tablets (75 mg total) by mouth daily.   SUMAtriptan 25 MG tablet Commonly known as: Imitrex Take 0.5 tablets (12.5 mg total) by mouth every 2 (two) hours as needed for migraine. May repeat in 2 hours if headache persists or recurs. What changed:  reasons to take this additional instructions         Follow-up appointment    Dr. Hartford Poli with Endocrinology on 2/9 at 1:00pm  [facility] [address] [place] [address]  Discharge Condition:    good    Signed: Otilio Carpen Abbigale Mcelhaney 02/09/2021, 10:48 AM

## 2021-02-09 NOTE — Progress Notes (Addendum)
NAMEEduarda [name], MRN:  [id number], DOB:  [birth year], LOS: 2 ADMISSION DATE:  02/07/2021, CONSULTATION DATE:  02/07/21 REFERRING MD:  [facility] CHIEF COMPLAINT:  DKA   History of Present Illness:  Dawn [name] is a 31 year woman with DMI, asthma, migraines and anxiety who is admitted for DKA. She reports having some fevers and chills prior to admission with cough and sputum production. She is also having mild abdominal discomfort, denies any nausea or diarrhea.   Initial blood sugar was greater than 600. ABG showed pH 6.872, pCO 25, pO2 52. UA showed ketones present. Lipase not elevated. Procal 0.62. Cr 1.46.   She was given 2L NS in ER. Started on insulin drip and LR maintenance fluids.   Pertinent  Medical History   DMI with episodes of DKA Asthma Migraines  Significant Hospital Events: Including procedures, antibiotic start and stop dates in addition to other pertinent events   2/5 admitted to ICU for DKA 2/6 transitioned off insulin gtt  Interim History / Subjective:   No acute events overnight.  Transitioned to levemir 20 units BID and sliding scale insulin  Patient tolerating oral diet.    Objective   Blood pressure 119/78, pulse 87, temperature 98.6 F (37 C), temperature source Oral, resp. rate (!) 24, height $RemoveBe'5\' 1"'tOTVxAFjl$  (1.549 m), weight 47.6 kg, last menstrual period 01/21/2021, SpO2 100 %.        Intake/Output Summary (Last 24 hours) at 02/09/2021 0732 Last data filed at 02/09/2021 0100 Gross per 24 hour  Intake 959.49 ml  Output 700 ml  Net 259.49 ml   Filed Weights   02/07/21 0034 02/07/21 0035  Weight: 48 kg 47.6 kg   Examination: General: young woman, no acute distress HENT: [place]/AT, moist mucous membranes, sclera anicteric Lungs: clear to auscultation, talking in full sentences today, no wheezing Cardiovascular: rrr, s1s2, no murmurs Abdomen: soft, non-tender, non-distended, bowel sounds normal Extremities: cool to touch, no edema Neuro: alert, moving all  extremities but lethargic GU: deferred  Resolved Hospital Problem list     Assessment & Plan:  Diabetic Ketoacidosis Diabetes Mellitus Type I - Appears to be in setting of missed home insulin dosing.  - Lipase negative, UA not concerning for UTI - She was transitioned to 20 units levemir BID on 2/6 and SSI - Follow up appointment with Dr. Hartford Poli her endocrinologist scheduled for 2/9 at 1pm at the [facility] office.  - she is safe for discharge today back to her home regimen of 40 units lantus with SSI  Acute Kidney Injury Pseudohyponatremia Hyperkalemia - renal function back to baseline - monitor Cr, Na and K - K being repleted this morning  Migraines/Headache - PRN tylenol  Elevated Alk Phos - monitor  Leukocytosis Stress reaction in DKA   Best Practice (right click and "Reselect all SmartList Selections" daily)   Diet/type: Regular consistency (see orders) DVT prophylaxis: LMWH GI prophylaxis: N/A Lines: N/A Foley:  N/A Code Status:  full code Last date of multidisciplinary goals of care discussion [n/a]  Labs   CBC: Recent Labs  Lab 02/07/21 0224 02/07/21 0627 02/08/21 0249  WBC 20.5* 25.5* 12.1*  NEUTROABS 13.9*  --  6.9  HGB 14.1 12.6 11.2*  HCT 47.6* 41.9 34.4*  MCV 98.6 98.6 89.1  PLT 434* 273 786    Basic Metabolic Panel: Recent Labs  Lab 02/07/21 2312 02/08/21 0249 02/08/21 0722 02/08/21 1139 02/08/21 1454 02/08/21 1906 02/08/21 2241 02/09/21 0233  NA 133* 134*   < > 135  134* 135 138 137  K 3.9 3.4*   < > 3.8 3.8 3.7 3.6 3.3*  CL 107 108   < > 107 105 104 106 106  CO2 13* 16*   < > 17* 19* 22 21* 23  GLUCOSE 184* 150*   < > 186* 333* 308* 223* 212*  BUN 9 8   < > $R'6 6 9 11 12  'NN$ CREATININE 0.59 0.48   < > 0.58 0.78 0.61 0.59 0.50  CALCIUM 8.6* 8.2*   < > 8.4* 8.2* 8.5* 9.0 8.5*  MG 2.0  --   --   --   --   --   --   --   PHOS  --  1.5*  --   --   --   --   --   --    < > = values in this interval not displayed.   GFR: Estimated  Creatinine Clearance: 85 mL/min (by C-G formula based on SCr of 0.5 mg/dL). Recent Labs  Lab 02/07/21 0224 02/07/21 0415 02/07/21 0627 02/08/21 0249  PROCALCITON  --  0.62  --   --   WBC 20.5*  --  25.5* 12.1*    Liver Function Tests: Recent Labs  Lab 02/07/21 0224  AST 34  ALT 27  ALKPHOS 173*  BILITOT 1.0  PROT 9.4*  ALBUMIN 4.7   Recent Labs  Lab 02/07/21 0415  LIPASE 32   No results for input(s): AMMONIA in the last 168 hours.  ABG    Component Value Date/Time   PHART 7.371 02/07/2021 1338   PCO2ART 22.0 (L) 02/07/2021 1338   PO2ART 110 (H) 02/07/2021 1338   HCO3 14.5 (L) 02/07/2021 2312   TCO2 <5 (L) 01/03/2021 0254   ACIDBASEDEF 9.2 (H) 02/07/2021 2312   O2SAT 72.5 02/07/2021 2312     Coagulation Profile: No results for input(s): INR, PROTIME in the last 168 hours.  Cardiac Enzymes: No results for input(s): CKTOTAL, CKMB, CKMBINDEX, TROPONINI in the last 168 hours.  HbA1C: HbA1c, POC (controlled diabetic range)  Date/Time Value Ref Range Status  08/18/2020 03:32 PM 14.7 (A) 0.0 - 7.0 % Final   HbA1c POC (<> result, manual entry)  Date/Time Value Ref Range Status  06/26/2020 10:12 AM >15.0 4.0 - 5.6 % Final  02/19/2020 10:10 AM >15.0 4.0 - 5.6 % Final   Hgb A1c MFr Bld  Date/Time Value Ref Range Status  02/07/2021 02:24 AM 14.0 (H) 4.8 - 5.6 % Final    Comment:    (NOTE) Pre diabetes:          5.7%-6.4%  Diabetes:              >6.4%  Glycemic control for   <7.0% adults with diabetes   01/03/2021 04:43 AM >15.5 (H) 4.8 - 5.6 % Final    Comment:    (NOTE) **Verified by repeat analysis**         Prediabetes: 5.7 - 6.4         Diabetes: >6.4         Glycemic control for adults with diabetes: <7.0     CBG: Recent Labs  Lab 02/08/21 1456 02/08/21 1604 02/08/21 1939 02/08/21 2332 02/09/21 0316  GLUCAP 318* 320* 322* 198* 197*     Critical care time: n/a    Freda Jackson, MD [facility] Office:  [phone]   See Amion for personal pager PCCM on call pager [phone] until 7pm. Please call Elink 7p-7a.  [phone] ° ° ° ° °

## 2021-02-24 ENCOUNTER — Encounter: Payer: Medicaid Other | Attending: Endocrinology | Admitting: Nutrition

## 2021-02-24 ENCOUNTER — Other Ambulatory Visit: Payer: Self-pay

## 2021-02-24 DIAGNOSIS — Z794 Long term (current) use of insulin: Secondary | ICD-10-CM | POA: Insufficient documentation

## 2021-02-24 DIAGNOSIS — Z713 Dietary counseling and surveillance: Secondary | ICD-10-CM | POA: Insufficient documentation

## 2021-02-24 DIAGNOSIS — E1065 Type 1 diabetes mellitus with hyperglycemia: Secondary | ICD-10-CM | POA: Insufficient documentation

## 2021-02-27 NOTE — Progress Notes (Signed)
Patient says she is here today to learn more about what she foods she should eat.  Says lives with aunt and she cooks her a meal at [employer] Insulin dose: Lantus 30u  at 8PM.  Says rarely forgets this dose                Humalog: 5u for small meals, 10u for medium meals and 15 for larger meals-Says she does a correction dose per sliding scale.  This insulin is pc meals (says was told take insulin after meals-which can be immediately, or up to 1 hour later.  SBGM.  Uses a meter, says tried [place] and [place], but did like them.  Currently using a meter, but thinks it is a AccuChek, but did not know what kind. Says "tries to test every morning, and before meals, but "not always".   Typical Day: 10-11AM- up will drink water.  Eats 3 eggs and 3 bacon, sometimes grits, usually 1 pancake with "lots of light syrup, or 3 eggs with bacon and orange juice-4 ounces, or 8 ounces of apple juice,  sometimes Cinnamon Toast Crunch cereal.  Says usually will take 10u of Humalog for above meals.   12-1, cellery and carrots with ranch dressing, or snack of peanut butter crackers or fruit, or "something else" because she is "always hungry"  Takes no Novolog 2 PM, will snack again on "whatever is around",with no            Humalog,  will go to [employer] and get chicken salad with range dressing 4-5 PM: Supper:  always protein, but usually fried- chicken- 2 pieces, with"lots of veg.".  did not know which vegitables are starchy, and bread some times.  Usually no desert at this time  15u of Humalog. 8PM.  Snacks again on cellery and carrots, usually nothing sweet.   Diet Assessment:  multiple meals daily varying in amounts of carb, with insulin coverage only at breakfast and supper.Meals also high in fat, which Discussion: Discussed timing of insulin in reference ot food eaten, and the need to take this insulin always before the meal, unless the meal contains 2 or more high fat items.  She agreed to do this. Need for insulin with all  meals/snacks eaten unless treating a low blood sugar.  She says she does not like taking "so many injections, but tries to do this.  She was shown the Cequr that hold Humalog for 3 days and delivers the insulin in 2u increments by pressing a button.  She was given a brochure of this and told to speak with her MD to see if can be ordered for her.  She is snacking 3-4X/day with no insulin coverage.  She was told that she can be given a 6 day free trial if Md writes the order for her.  She likes the idea of only 1 stick for Humalog dose every 3 days.   Discussed appropriate treatments of low blood sugar and need not to take in food that have fat-which slow blood sugar rise, causing more food needed and high blood sugars 3-5 hours later. Discussed how all 3 food groups affect blood sugar rise, and the need for all 3 in each meal, with eye on how much carbs are eaten.  Showed her that meal with pancakes and syrup, and cereal and milk  have  more carbs, requiring more humalog.   Discussed effect of exercise on blood sugar control and need to figure this in before giving insulin  for snacks and meals.   Need for know what blood sugar is doing before a meal, to determine if she took the correct dose with previous meal.  She was given a Libre 3 because it is smaller and more discreet. She downloaded the app to her phone for this, and will start seeing readings in 30 minutes. I suggested returning in 2 weeks to review CGM to determine how she is determining large/medium and smaller meals, to see if insulin dose is appropriate for her meals eaten.  She agreed to do this.  Says needs a new meter, and was given a AccuChek Guide meter for use.  She reports that she know how to use this, and can set date/time

## 2021-02-27 NOTE — Patient Instructions (Signed)
Scan libre 3 before meals to make needed adjustment in meal time insulin dose Review Cequr information and discuss with MD for possible trial of this. Make sure all meals have carbohydrate (milk, fruit, starches), protein, and small amount of fat Take Humalog dose before all meals and snack.  Snack may require 1-2units when have lots of carbs.

## 2021-03-09 ENCOUNTER — Other Ambulatory Visit: Payer: Self-pay

## 2021-03-09 ENCOUNTER — Inpatient Hospital Stay (HOSPITAL_COMMUNITY)
Admission: EM | Admit: 2021-03-09 | Discharge: 2021-03-11 | DRG: 638 | Disposition: A | Discharge status: Home / Self Care | Payer: Medicaid Other | Attending: Family Medicine | Admitting: Family Medicine

## 2021-03-09 DIAGNOSIS — D751 Secondary polycythemia: Secondary | ICD-10-CM | POA: Diagnosis present

## 2021-03-09 DIAGNOSIS — E872 Acidosis, unspecified: Secondary | ICD-10-CM

## 2021-03-09 DIAGNOSIS — R739 Hyperglycemia, unspecified: Secondary | ICD-10-CM

## 2021-03-09 DIAGNOSIS — F54 Psychological and behavioral factors associated with disorders or diseases classified elsewhere: Secondary | ICD-10-CM | POA: Diagnosis present

## 2021-03-09 DIAGNOSIS — J45909 Unspecified asthma, uncomplicated: Secondary | ICD-10-CM | POA: Diagnosis present

## 2021-03-09 DIAGNOSIS — R197 Diarrhea, unspecified: Secondary | ICD-10-CM

## 2021-03-09 DIAGNOSIS — Z825 Family history of asthma and other chronic lower respiratory diseases: Secondary | ICD-10-CM

## 2021-03-09 DIAGNOSIS — F331 Major depressive disorder, recurrent, moderate: Secondary | ICD-10-CM | POA: Diagnosis present

## 2021-03-09 DIAGNOSIS — Z91013 Allergy to seafood: Secondary | ICD-10-CM

## 2021-03-09 DIAGNOSIS — Z833 Family history of diabetes mellitus: Secondary | ICD-10-CM

## 2021-03-09 DIAGNOSIS — Z79899 Other long term (current) drug therapy: Secondary | ICD-10-CM

## 2021-03-09 DIAGNOSIS — Z91048 Other nonmedicinal substance allergy status: Secondary | ICD-10-CM

## 2021-03-09 DIAGNOSIS — R1031 Right lower quadrant pain: Secondary | ICD-10-CM

## 2021-03-09 DIAGNOSIS — R112 Nausea with vomiting, unspecified: Secondary | ICD-10-CM | POA: Diagnosis present

## 2021-03-09 DIAGNOSIS — Z20822 Contact with and (suspected) exposure to covid-19: Secondary | ICD-10-CM | POA: Diagnosis present

## 2021-03-09 DIAGNOSIS — Z91040 Latex allergy status: Secondary | ICD-10-CM

## 2021-03-09 DIAGNOSIS — G43909 Migraine, unspecified, not intractable, without status migrainosus: Secondary | ICD-10-CM | POA: Diagnosis present

## 2021-03-09 DIAGNOSIS — N179 Acute kidney failure, unspecified: Secondary | ICD-10-CM | POA: Diagnosis present

## 2021-03-09 DIAGNOSIS — E1010 Type 1 diabetes mellitus with ketoacidosis without coma: Principal | ICD-10-CM | POA: Diagnosis present

## 2021-03-09 DIAGNOSIS — E860 Dehydration: Secondary | ICD-10-CM | POA: Diagnosis present

## 2021-03-09 DIAGNOSIS — Z9641 Presence of insulin pump (external) (internal): Secondary | ICD-10-CM | POA: Diagnosis present

## 2021-03-09 DIAGNOSIS — N281 Cyst of kidney, acquired: Secondary | ICD-10-CM | POA: Diagnosis present

## 2021-03-09 DIAGNOSIS — Z794 Long term (current) use of insulin: Secondary | ICD-10-CM

## 2021-03-09 LAB — URINALYSIS, ROUTINE W REFLEX MICROSCOPIC
Bilirubin Urine: NEGATIVE
Glucose, UA: >=500 mg/dL — AB
Hgb urine dipstick: NEGATIVE
Ketones, ur: 80 mg/dL — AB
Nitrite: NEGATIVE
Protein, ur: 100 mg/dL — AB
Specific Gravity, Urine: 1.016 (ref 1.005–1.030)
pH: 5 (ref 5.0–8.0)

## 2021-03-09 LAB — CBC WITH DIFFERENTIAL/PLATELET
Abs Immature Granulocytes: 0.02 10*3/uL (ref 0.00–0.07)
Basophils Absolute: 0.1 10*3/uL (ref 0.0–0.1)
Basophils Relative: 1 %
Eosinophils Absolute: 0 10*3/uL (ref 0.0–0.5)
Eosinophils Relative: 0 %
HCT: 48.3 % — ABNORMAL HIGH (ref 36.0–46.0)
Hemoglobin: 15.3 g/dL — ABNORMAL HIGH (ref 12.0–15.0)
Immature Granulocytes: 0 %
Lymphocytes Relative: 42 %
Lymphs Abs: 4.4 10*3/uL — ABNORMAL HIGH (ref 0.7–4.0)
MCH: 29 pg (ref 26.0–34.0)
MCHC: 31.7 g/dL (ref 30.0–36.0)
MCV: 91.5 fL (ref 80.0–100.0)
Monocytes Absolute: 0.8 10*3/uL (ref 0.1–1.0)
Monocytes Relative: 7 %
Neutro Abs: 5.2 10*3/uL (ref 1.7–7.7)
Neutrophils Relative %: 50 %
Platelets: 361 10*3/uL (ref 150–400)
RBC: 5.28 MIL/uL — ABNORMAL HIGH (ref 3.87–5.11)
RDW: 13.3 % (ref 11.5–15.5)
WBC: 10.4 10*3/uL (ref 4.0–10.5)
nRBC: 0 % (ref 0.0–0.2)

## 2021-03-09 LAB — LIPASE, BLOOD: Lipase: 38 U/L (ref 11–51)

## 2021-03-09 LAB — I-STAT VENOUS BLOOD GAS, ED
Acid-base deficit: 14 mmol/L — ABNORMAL HIGH (ref 0.0–2.0)
Bicarbonate: 10.6 mmol/L — ABNORMAL LOW (ref 20.0–28.0)
Calcium, Ion: 1.36 mmol/L (ref 1.15–1.40)
HCT: 50 % — ABNORMAL HIGH (ref 36.0–46.0)
Hemoglobin: 17 g/dL — ABNORMAL HIGH (ref 12.0–15.0)
O2 Saturation: 96 %
Potassium: 4.1 mmol/L (ref 3.5–5.1)
Sodium: 136 mmol/L (ref 135–145)
TCO2: 11 mmol/L — ABNORMAL LOW (ref 22–32)
pCO2, Ven: 22.5 mmHg — ABNORMAL LOW (ref 44–60)
pH, Ven: 7.282 (ref 7.25–7.43)
pO2, Ven: 86 mmHg — ABNORMAL HIGH (ref 32–45)

## 2021-03-09 LAB — D-DIMER, QUANTITATIVE: D-Dimer, Quant: 0.94 ug/mL-FEU — ABNORMAL HIGH (ref 0.00–0.50)

## 2021-03-09 LAB — BASIC METABOLIC PANEL
Anion gap: 17 — ABNORMAL HIGH (ref 5–15)
BUN: 28 mg/dL — ABNORMAL HIGH (ref 6–20)
CO2: 10 mmol/L — ABNORMAL LOW (ref 22–32)
Calcium: 10.7 mg/dL — ABNORMAL HIGH (ref 8.9–10.3)
Chloride: 107 mmol/L (ref 98–111)
Creatinine, Ser: 1.24 mg/dL — ABNORMAL HIGH (ref 0.44–1.00)
GFR, Estimated: >60 mL/min (ref >60)
Glucose, Bld: 131 mg/dL — ABNORMAL HIGH (ref 70–99)
Potassium: 3.9 mmol/L (ref 3.5–5.1)
Sodium: 134 mmol/L — ABNORMAL LOW (ref 135–145)

## 2021-03-09 LAB — I-STAT BETA HCG BLOOD, ED (MC, WL, AP ONLY)
I-stat hCG, quantitative: 5.2 m[IU]/mL — ABNORMAL HIGH (ref <5)
I-stat hCG, quantitative: <5 m[IU]/mL (ref <5)

## 2021-03-09 LAB — CBG MONITORING, ED
Glucose-Capillary: 179 mg/dL — ABNORMAL HIGH (ref 70–99)
Glucose-Capillary: 126 mg/dL — ABNORMAL HIGH (ref 70–99)

## 2021-03-09 LAB — HCG, QUANTITATIVE, PREGNANCY: hCG, Beta Chain, Quant, S: 1 m[IU]/mL (ref <5)

## 2021-03-09 LAB — BETA-HYDROXYBUTYRIC ACID: Beta-Hydroxybutyric Acid: 3.72 mmol/L — ABNORMAL HIGH (ref 0.05–0.27)

## 2021-03-09 MED ORDER — LACTATED RINGERS IV BOLUS
1000.0000 mL | Freq: Once | INTRAVENOUS | Status: AC
  Administered 2021-03-09: 1000 mL via INTRAVENOUS

## 2021-03-09 MED ORDER — LACTATED RINGERS IV BOLUS: 20.0000 mL/kg | Freq: Once | INTRAVENOUS | Status: DC

## 2021-03-09 NOTE — ED Triage Notes (Addendum)
Pt brought in by EMS for high blood sugar. EMS reports blood sugar 250. Pt recently given an insulin pump but states she does not know how to use it. Pt also reports vomiting and abd pain. Pt alert and oriented x 4. Pt states her last menstrual cycle was early January so she is unsure if she is pregnant. ?

## 2021-03-09 NOTE — ED Notes (Signed)
AFTER PT AMBULATED TO BATHROOM PATIENT PRESENTING WITH TACHYPNEA AND HR INCREASE OF 143. PT WAS VERY FATIGUED AND DIZZY WHEN AMBULATING TO THE BATHROOM ?

## 2021-03-09 NOTE — ED Provider Notes (Signed)
[facility] EMERGENCY DEPARTMENT Provider Note   CSN: [id number] Arrival date & time: 03/09/21  2144     History  Chief Complaint  Patient presents with   Hyperglycemia   Emesis   Abdominal Pain    Dawn [name] is a 21 y.o. female.   Hyperglycemia Associated symptoms: abdominal pain, nausea and vomiting   Associated symptoms: no chest pain and no shortness of breath   Emesis Associated symptoms: abdominal pain and chills   Associated symptoms: no cough   Abdominal Pain Associated symptoms: chills, nausea and vomiting   Associated symptoms: no chest pain, no cough and no shortness of breath       21 year old female with a history of DM1 who presents to the emergency department with chief complaint of hyperglycemia, nausea, vomiting, diarrhea with concern for DKA.  For the past week she has been having nausea, vomiting and RLQ abdominal pain. She took her blood sugar at home and it was 325.  She states she took an extra dose of Lantus for a total of 80 units.  She has an insulin pump but doesn't know how to use it. She denies fevers, endorses chills. Able to tolerate fluids, hasn't been tolerating solids. Can't eat [employer]. She denies any vaginal bleeding, does endorse increased vaginal discharge. Her last menstrual period was in early January. She endorses at least 6 episodes of watery diarrhea for the past 3-4 days.  She states that she is on OCPs but cannot remember the type.  Home Medications Prior to Admission medications   Medication Sig Start Date End Date Taking? Authorizing Provider  Accu-Chek Softclix Lancets lancets Please use to check blood sugar up to 6 times/day. 07/01/19   [name], MD  albuterol [facility] HFA) 108 [phone] Base) MCG/ACT inhaler Inhale 2 puffs into the lungs every 6 (six) hours as needed for wheezing or shortness of breath.    [provider]  albuterol (PROVENTIL) (2.5 MG/3ML) 0.083% nebulizer solution TAKE 3 MLS BY  NEBULIZER EVERY 6 HOURS AS NEEDED Patient not taking: Reported on 02/07/2021 11/30/20   [name], MD  Blood Glucose Monitoring Suppl (ACCU-CHEK GUIDE) w/Device KIT 1 kit by Does not apply route daily as needed. 07/01/19   [name], MD  Continuous Blood Gluc Sensor (FREESTYLE LIBRE 2 SENSOR) MISC Change sensor every 14 days. Patient taking differently: Inject 1 Device into the skin every 14 (fourteen) days. 11/07/19   Sherrlyn Hock, MD  Continuous Blood Gluc Transmit (DEXCOM G6 TRANSMITTER) MISC See admin instructions. 07/22/20   [provider]  diphenhydramine-acetaminophen (TYLENOL PM) 25-500 MG TABS tablet Take 2 tablets by mouth every 6 (six) hours as needed (for headaches).    [provider]  glucagon 1 MG injection Inject 1 mg IM for severe hypoglycemia Patient taking differently: Inject 1 mg into the muscle once as needed ("for severe hypoglycemia"). 01/08/21   Eppie Gibson, MD  glucose blood (ACCU-CHEK GUIDE) test strip Use to check glucose 6x daily 08/17/20   Dickie La, MD  insulin glargine (LANTUS SOLOSTAR) 100 UNIT/ML Solostar Pen Inject 40 Units into the skin at bedtime.    [provider]  insulin lispro (HUMALOG KWIKPEN) 100 UNIT/ML KwikPen Inject 10 Units into the skin with breakfast, with lunch, and with evening meal. Patient taking differently: Inject 10 Units into the skin 4 (four) times daily. 07/13/20 02/07/21  Ezequiel Essex, MD  Insulin Pen Needle (BD PEN NEEDLE NANO 2ND GEN) 32G X 4 MM MISC  USE TO INJECT INSULIN 6 TIMES DAILY 08/26/19   [name], MD  Insulin Syringe-Needle U-100 (INSULIN SYRINGE .5CC/30GX1/2") 30G X 1/2" 0.5 ML MISC Use to draw up Novolog three times daily. 06/08/20   Sharion Settler, DO  Insulin Syringe-Needle U-100 (INSULIN SYRINGE 1CC/30GX1/2") 30G X 1/2" 1 ML MISC Use to draw up Lantus daily. 06/08/20   Sharion Settler, DO  sertraline (ZOLOFT) 50 MG tablet Take 1.5 tablets (75 mg total) by mouth daily. 07/01/20    [name], MD  SUMAtriptan (IMITREX) 25 MG tablet Take 0.5 tablets (12.5 mg total) by mouth every 2 (two) hours as needed for migraine. May repeat in 2 hours if headache persists or recurs. Patient taking differently: Take 12.5 mg by mouth every 2 (two) hours as needed for migraine (and may repeat in 2 hours if headache persists or recurs). 01/08/21   Eppie Gibson, MD      Allergies    Latex, Tape, and Shellfish allergy    Review of Systems   Review of Systems  Constitutional:  Positive for chills.  Respiratory:  Negative for cough and shortness of breath.   Cardiovascular:  Negative for chest pain.  Gastrointestinal:  Positive for abdominal pain, nausea and vomiting.  All other systems reviewed and are negative.  Physical Exam Updated Vital Signs BP (!) 121/91    Pulse (!) 131    Temp 98.8 F (37.1 C) (Oral)    Resp 16    Ht 5' 1"  (1.549 m)    Wt 47.6 kg    SpO2 100%    BMI 19.84 kg/m  Physical Exam Vitals and nursing note reviewed.  Constitutional:      General: She is not in acute distress.    Appearance: She is well-developed.  HENT:     Head: Normocephalic and atraumatic.     Mouth/Throat:     Mouth: Mucous membranes are dry.  Eyes:     Conjunctiva/sclera: Conjunctivae normal.  Cardiovascular:     Rate and Rhythm: Regular rhythm. Tachycardia present.  Pulmonary:     Effort: Pulmonary effort is normal. No respiratory distress.     Breath sounds: Normal breath sounds.  Abdominal:     Palpations: Abdomen is soft.     Tenderness: There is abdominal tenderness in the right lower quadrant. There is no guarding or rebound. Positive signs include McBurney's sign. Negative signs include Murphy's sign.  Musculoskeletal:        General: No swelling.     Cervical back: Neck supple.  Skin:    General: Skin is warm and dry.     Capillary Refill: Capillary refill takes less than 2 seconds.  Neurological:     Mental Status: She is alert.  Psychiatric:        Mood and  Affect: Mood normal.    ED Results / Procedures / Treatments   Labs (all labs ordered are listed, but only abnormal results are displayed) Labs Reviewed  CBC WITH DIFFERENTIAL/PLATELET - Abnormal; Notable for the following components:      Result Value   RBC 5.28 (*)    Hemoglobin 15.3 (*)    HCT 48.3 (*)    Lymphs Abs 4.4 (*)    All other components within normal limits  URINALYSIS, ROUTINE W REFLEX MICROSCOPIC - Abnormal; Notable for the following components:   APPearance CLOUDY (*)    Glucose, UA >=500 (*)    Ketones, ur 80 (*)    Protein, ur 100 (*)  Leukocytes,Ua MODERATE (*)    Bacteria, UA FEW (*)    All other components within normal limits  D-DIMER, QUANTITATIVE - Abnormal; Notable for the following components:   D-Dimer, Quant 0.94 (*)    All other components within normal limits  CBG MONITORING, ED - Abnormal; Notable for the following components:   Glucose-Capillary 179 (*)    All other components within normal limits  I-STAT VENOUS BLOOD GAS, ED - Abnormal; Notable for the following components:   pCO2, Ven 22.5 (*)    pO2, Ven 86 (*)    Bicarbonate 10.6 (*)    TCO2 11 (*)    Acid-base deficit 14.0 (*)    HCT 50.0 (*)    Hemoglobin 17.0 (*)    All other components within normal limits  CBG MONITORING, ED - Abnormal; Notable for the following components:   Glucose-Capillary 126 (*)    All other components within normal limits  I-STAT BETA HCG BLOOD, ED ([facility], [facility], [facility] ONLY) - Abnormal; Notable for the following components:   I-stat hCG, quantitative 5.2 (*)    All other components within normal limits  BASIC METABOLIC PANEL  BASIC METABOLIC PANEL  BASIC METABOLIC PANEL  BASIC METABOLIC PANEL  BETA-HYDROXYBUTYRIC ACID  BETA-HYDROXYBUTYRIC ACID  LIPASE, BLOOD  HCG, QUANTITATIVE, PREGNANCY  I-STAT BETA HCG BLOOD, ED ([facility], [facility], [facility] ONLY)    EKG EKG Interpretation  Date/Time:  Tuesday March 09 2021 21:56:55 EST Ventricular Rate:  135 PR Interval:  111 QRS  Duration: 73 QT Interval:  299 QTC Calculation: 449 R Axis:   82 Text Interpretation: Sinus tachycardia Ventricular premature complex LAE, consider biatrial enlargement Borderline repolarization abnormality Confirmed by Regan Lemming ([id number]) on 03/09/2021 9:58:54 PM  Radiology No results found.  Procedures .Critical Care Performed by: Regan Lemming, MD Authorized by: Regan Lemming, MD   Critical care provider statement:    Critical care time (minutes):  38   Critical care was necessary to treat or prevent imminent or life-threatening deterioration of the following conditions:  Dehydration and endocrine crisis   Critical care was time spent personally by me on the following activities:  Development of treatment plan with patient or surrogate, obtaining history from patient or surrogate, ordering and performing treatments and interventions, ordering and review of laboratory studies, ordering and review of radiographic studies, pulse oximetry and re-evaluation of patient's condition    Medications Ordered in ED Medications  lactated ringers bolus 1,000 mL (has no administration in time range)  lactated ringers bolus 1,000 mL (1,000 mLs Intravenous New Bag/Given 03/09/21 2236)    ED Course/ Medical Decision Making/ A&P Clinical Course as of 03/09/21 [place] Mar 09, 2021  2206 Glucose-Capillary(!): 179 [JL]  2331 D-Dimer, Quant(!): 0.94 [JL]  2331 Bicarbonate(!): 10.6 [JL]  2332 pH, Ven: 7.282 [JL]    Clinical Course User Index [JL] Regan Lemming, MD                           Medical Decision Making Amount and/or Complexity of Data Reviewed Labs: ordered. Decision-making details documented in ED Course. Radiology: ordered.   21 year old female with a history of DM1 who presents to the emergency department with chief complaint of hyperglycemia, nausea, vomiting, diarrhea with concern for DKA.  For the past week she has been having nausea, vomiting and RLQ abdominal pain. She  took her blood sugar at home and it was 325.  She states she took an extra dose of Lantus  for a total of 80 units.  She has an insulin pump but doesn't know how to use it. She denies fevers, endorses chills. Able to tolerate fluids, hasn't been tolerating solids. Can't eat [employer]. She denies any vaginal bleeding, does endorse increased vaginal discharge. Her last menstrual period was in early January. She endorses at least 6 episodes of watery diarrhea for the past 3-4 days.  She states that she is on OCPs but cannot remember the type.  On arrival, the patient was afebrile, tachycardic P1 37, not tachypneic, normotensive, saturating 99% on room air.  Sinus tachycardia noted on cardiac telemetry.  Patient presents with a physical exam concerning for dry mucous membranes, sinus tachycardia, right lower quadrant tenderness to palpation.  Concern for infectious trigger and concern for DKA on arrival.  Initial CBG revealed a blood glucose of 179.  The patient did take extra doses of Lantus today.  We will need to monitor closely.  An IV fluid bolus was obtained due to concern for dehydration.  Ovulating to the bathroom, the patient became lightheaded and had a near syncopal episode.  She is on OCPs.  Considered PE.  D-dimer was collected and resulted positive to 0.94.  CTA PE study was collected.  CBC was negative for leukocytosis, showed evidence of dehydration with hemoconcentration with a hemoglobin of 15.3 and hematocrit of 48.  An i-STAT hCG was only mildly elevated to 5.2, hCG quantitative pending.  A VBG was concerning for an acidosis was 7.28 pH, HCO3 of 10.6.  Urinalysis revealed ketones in the urine 80, moderate leukocytes, few bacteria, 11-20 WBCs, greater than 500 glucose.  Beta hydroxybutyrate and full BMP pending.  Plan to obtain CT abdomen pelvis to rule out infectious etiology such as appendicitis or other intra-abdominal emergency.  Plan to evaluate for PE with CTA PE imaging imaging.  Plan  to reassess the patient following IV fluid resuscitation.   Signout given to Dr. Christy Gentles at 2330.  Final Clinical Impression(s) / ED Diagnoses Final diagnoses:  Dehydration  Metabolic acidosis  Hyperglycemia  Right lower quadrant abdominal pain  Diarrhea, unspecified type    Rx / DC Orders ED Discharge Orders     None         Regan Lemming, MD 03/09/21 2340

## 2021-03-10 ENCOUNTER — Inpatient Hospital Stay (HOSPITAL_COMMUNITY): Payer: Medicaid Other

## 2021-03-10 ENCOUNTER — Emergency Department (HOSPITAL_COMMUNITY): Payer: Medicaid Other

## 2021-03-10 ENCOUNTER — Encounter (HOSPITAL_COMMUNITY): Payer: Self-pay | Admitting: Student

## 2021-03-10 DIAGNOSIS — Z9641 Presence of insulin pump (external) (internal): Secondary | ICD-10-CM | POA: Diagnosis present

## 2021-03-10 DIAGNOSIS — R739 Hyperglycemia, unspecified: Secondary | ICD-10-CM | POA: Diagnosis not present

## 2021-03-10 DIAGNOSIS — Z79899 Other long term (current) drug therapy: Secondary | ICD-10-CM | POA: Diagnosis not present

## 2021-03-10 DIAGNOSIS — R112 Nausea with vomiting, unspecified: Secondary | ICD-10-CM | POA: Diagnosis not present

## 2021-03-10 DIAGNOSIS — Z91013 Allergy to seafood: Secondary | ICD-10-CM | POA: Diagnosis not present

## 2021-03-10 DIAGNOSIS — Z91040 Latex allergy status: Secondary | ICD-10-CM | POA: Diagnosis not present

## 2021-03-10 DIAGNOSIS — Z20822 Contact with and (suspected) exposure to covid-19: Secondary | ICD-10-CM | POA: Diagnosis present

## 2021-03-10 DIAGNOSIS — E872 Acidosis, unspecified: Secondary | ICD-10-CM

## 2021-03-10 DIAGNOSIS — Z91048 Other nonmedicinal substance allergy status: Secondary | ICD-10-CM | POA: Diagnosis not present

## 2021-03-10 DIAGNOSIS — N281 Cyst of kidney, acquired: Secondary | ICD-10-CM | POA: Diagnosis present

## 2021-03-10 DIAGNOSIS — N179 Acute kidney failure, unspecified: Secondary | ICD-10-CM | POA: Diagnosis present

## 2021-03-10 DIAGNOSIS — E860 Dehydration: Secondary | ICD-10-CM | POA: Diagnosis present

## 2021-03-10 DIAGNOSIS — Z833 Family history of diabetes mellitus: Secondary | ICD-10-CM | POA: Diagnosis not present

## 2021-03-10 DIAGNOSIS — Z794 Long term (current) use of insulin: Secondary | ICD-10-CM | POA: Diagnosis not present

## 2021-03-10 DIAGNOSIS — Z825 Family history of asthma and other chronic lower respiratory diseases: Secondary | ICD-10-CM | POA: Diagnosis not present

## 2021-03-10 DIAGNOSIS — J45909 Unspecified asthma, uncomplicated: Secondary | ICD-10-CM | POA: Diagnosis present

## 2021-03-10 DIAGNOSIS — R197 Diarrhea, unspecified: Secondary | ICD-10-CM | POA: Diagnosis not present

## 2021-03-10 DIAGNOSIS — G43909 Migraine, unspecified, not intractable, without status migrainosus: Secondary | ICD-10-CM | POA: Diagnosis present

## 2021-03-10 DIAGNOSIS — D751 Secondary polycythemia: Secondary | ICD-10-CM | POA: Diagnosis present

## 2021-03-10 DIAGNOSIS — F331 Major depressive disorder, recurrent, moderate: Secondary | ICD-10-CM | POA: Diagnosis present

## 2021-03-10 DIAGNOSIS — E1010 Type 1 diabetes mellitus with ketoacidosis without coma: Secondary | ICD-10-CM | POA: Diagnosis present

## 2021-03-10 LAB — BASIC METABOLIC PANEL
Anion gap: 11 (ref 5–15)
Anion gap: 10 (ref 5–15)
Anion gap: 14 (ref 5–15)
Anion gap: 12 (ref 5–15)
BUN: 11 mg/dL (ref 6–20)
BUN: 13 mg/dL (ref 6–20)
BUN: 14 mg/dL (ref 6–20)
BUN: 15 mg/dL (ref 6–20)
CO2: 16 mmol/L — ABNORMAL LOW (ref 22–32)
CO2: 16 mmol/L — ABNORMAL LOW (ref 22–32)
CO2: 15 mmol/L — ABNORMAL LOW (ref 22–32)
CO2: 16 mmol/L — ABNORMAL LOW (ref 22–32)
Calcium: 9.7 mg/dL (ref 8.9–10.3)
Calcium: 9.1 mg/dL (ref 8.9–10.3)
Calcium: 8.9 mg/dL (ref 8.9–10.3)
Calcium: 9.3 mg/dL (ref 8.9–10.3)
Chloride: 108 mmol/L (ref 98–111)
Chloride: 107 mmol/L (ref 98–111)
Chloride: 104 mmol/L (ref 98–111)
Chloride: 106 mmol/L (ref 98–111)
Creatinine, Ser: 0.65 mg/dL (ref 0.44–1.00)
Creatinine, Ser: 0.8 mg/dL (ref 0.44–1.00)
Creatinine, Ser: 0.88 mg/dL (ref 0.44–1.00)
Creatinine, Ser: 0.74 mg/dL (ref 0.44–1.00)
GFR, Estimated: >60 mL/min (ref >60)
GFR, Estimated: >60 mL/min (ref >60)
GFR, Estimated: >60 mL/min (ref >60)
GFR, Estimated: >60 mL/min (ref >60)
Glucose, Bld: 122 mg/dL — ABNORMAL HIGH (ref 70–99)
Glucose, Bld: 370 mg/dL — ABNORMAL HIGH (ref 70–99)
Glucose, Bld: 412 mg/dL — ABNORMAL HIGH (ref 70–99)
Glucose, Bld: 286 mg/dL — ABNORMAL HIGH (ref 70–99)
Potassium: 3.4 mmol/L — ABNORMAL LOW (ref 3.5–5.1)
Potassium: 4.7 mmol/L (ref 3.5–5.1)
Potassium: 3.9 mmol/L (ref 3.5–5.1)
Potassium: 4 mmol/L (ref 3.5–5.1)
Sodium: 135 mmol/L (ref 135–145)
Sodium: 133 mmol/L — ABNORMAL LOW (ref 135–145)
Sodium: 133 mmol/L — ABNORMAL LOW (ref 135–145)
Sodium: 134 mmol/L — ABNORMAL LOW (ref 135–145)

## 2021-03-10 LAB — CBG MONITORING, ED
Glucose-Capillary: 95 mg/dL (ref 70–99)
Glucose-Capillary: 27 mg/dL — CL (ref 70–99)
Glucose-Capillary: 165 mg/dL — ABNORMAL HIGH (ref 70–99)
Glucose-Capillary: 158 mg/dL — ABNORMAL HIGH (ref 70–99)
Glucose-Capillary: 135 mg/dL — ABNORMAL HIGH (ref 70–99)
Glucose-Capillary: 79 mg/dL (ref 70–99)
Glucose-Capillary: 119 mg/dL — ABNORMAL HIGH (ref 70–99)
Glucose-Capillary: 62 mg/dL — ABNORMAL LOW (ref 70–99)
Glucose-Capillary: 137 mg/dL — ABNORMAL HIGH (ref 70–99)
Glucose-Capillary: 127 mg/dL — ABNORMAL HIGH (ref 70–99)
Glucose-Capillary: 134 mg/dL — ABNORMAL HIGH (ref 70–99)
Glucose-Capillary: 117 mg/dL — ABNORMAL HIGH (ref 70–99)

## 2021-03-10 LAB — CBC WITH DIFFERENTIAL/PLATELET
Abs Immature Granulocytes: 0.01 10*3/uL (ref 0.00–0.07)
Basophils Absolute: 0 10*3/uL (ref 0.0–0.1)
Basophils Relative: 1 %
Eosinophils Absolute: 0.1 10*3/uL (ref 0.0–0.5)
Eosinophils Relative: 1 %
HCT: 40.3 % (ref 36.0–46.0)
Hemoglobin: 12.9 g/dL (ref 12.0–15.0)
Immature Granulocytes: 0 %
Lymphocytes Relative: 40 %
Lymphs Abs: 3.3 10*3/uL (ref 0.7–4.0)
MCH: 29 pg (ref 26.0–34.0)
MCHC: 32 g/dL (ref 30.0–36.0)
MCV: 90.6 fL (ref 80.0–100.0)
Monocytes Absolute: 0.9 10*3/uL (ref 0.1–1.0)
Monocytes Relative: 11 %
Neutro Abs: 3.9 10*3/uL (ref 1.7–7.7)
Neutrophils Relative %: 47 %
Platelets: 293 10*3/uL (ref 150–400)
RBC: 4.45 MIL/uL (ref 3.87–5.11)
RDW: 13.3 % (ref 11.5–15.5)
WBC: 8.2 10*3/uL (ref 4.0–10.5)
nRBC: 0 % (ref 0.0–0.2)

## 2021-03-10 LAB — COMPREHENSIVE METABOLIC PANEL
ALT: 16 U/L (ref 0–44)
AST: 15 U/L (ref 15–41)
Albumin: 3.4 g/dL — ABNORMAL LOW (ref 3.5–5.0)
Alkaline Phosphatase: 80 U/L (ref 38–126)
Anion gap: 17 — ABNORMAL HIGH (ref 5–15)
BUN: 19 mg/dL (ref 6–20)
CO2: 13 mmol/L — ABNORMAL LOW (ref 22–32)
Calcium: 9.5 mg/dL (ref 8.9–10.3)
Chloride: 103 mmol/L (ref 98–111)
Creatinine, Ser: 0.94 mg/dL (ref 0.44–1.00)
GFR, Estimated: >60 mL/min (ref >60)
Glucose, Bld: 173 mg/dL — ABNORMAL HIGH (ref 70–99)
Potassium: 3.8 mmol/L (ref 3.5–5.1)
Sodium: 133 mmol/L — ABNORMAL LOW (ref 135–145)
Total Bilirubin: 0.8 mg/dL (ref 0.3–1.2)
Total Protein: 7.6 g/dL (ref 6.5–8.1)

## 2021-03-10 LAB — I-STAT CHEM 8, ED
BUN: 22 mg/dL — ABNORMAL HIGH (ref 6–20)
Calcium, Ion: 1.2 mmol/L (ref 1.15–1.40)
Chloride: 107 mmol/L (ref 98–111)
Creatinine, Ser: 0.4 mg/dL — ABNORMAL LOW (ref 0.44–1.00)
Glucose, Bld: 172 mg/dL — ABNORMAL HIGH (ref 70–99)
HCT: 43 % (ref 36.0–46.0)
Hemoglobin: 14.6 g/dL (ref 12.0–15.0)
Potassium: 3.8 mmol/L (ref 3.5–5.1)
Sodium: 135 mmol/L (ref 135–145)
TCO2: 15 mmol/L — ABNORMAL LOW (ref 22–32)

## 2021-03-10 LAB — PHOSPHORUS: Phosphorus: 3.2 mg/dL (ref 2.5–4.6)

## 2021-03-10 LAB — BETA-HYDROXYBUTYRIC ACID
Beta-Hydroxybutyric Acid: 1.27 mmol/L — ABNORMAL HIGH (ref 0.05–0.27)
Beta-Hydroxybutyric Acid: 0.82 mmol/L — ABNORMAL HIGH (ref 0.05–0.27)
Beta-Hydroxybutyric Acid: 0.29 mmol/L — ABNORMAL HIGH (ref 0.05–0.27)

## 2021-03-10 LAB — RESP PANEL BY RT-PCR (FLU A&B, COVID) ARPGX2
Influenza A by PCR: NEGATIVE
Influenza B by PCR: NEGATIVE
SARS Coronavirus 2 by RT PCR: NEGATIVE

## 2021-03-10 LAB — GLUCOSE, CAPILLARY
Glucose-Capillary: 375 mg/dL — ABNORMAL HIGH (ref 70–99)
Glucose-Capillary: 281 mg/dL — ABNORMAL HIGH (ref 70–99)

## 2021-03-10 IMAGING — US US RENAL
1 series · 14 of 25 positions shown · non-contrast
Comparison: Abdomen pelvis CT, dated [DATE]

CLINICAL DATA: Follow-up renal cyst seen on abdomen pelvis CT.

EXAM:
RENAL / URINARY TRACT ULTRASOUND COMPLETE

[Series 1: us renal · 14 of 48 slices shown]
[im 1/48]
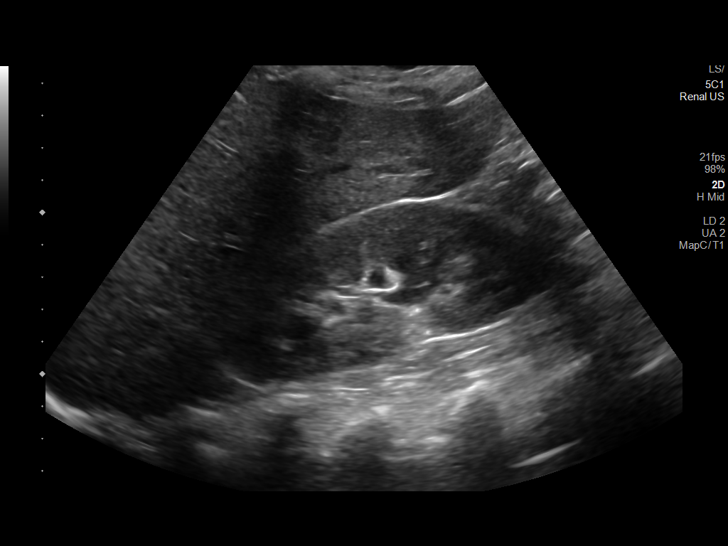
[im 4/48]
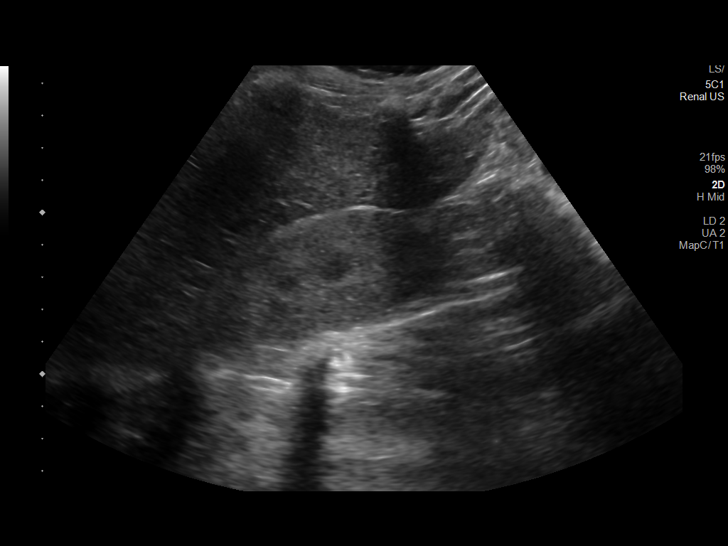
[im 8/48]
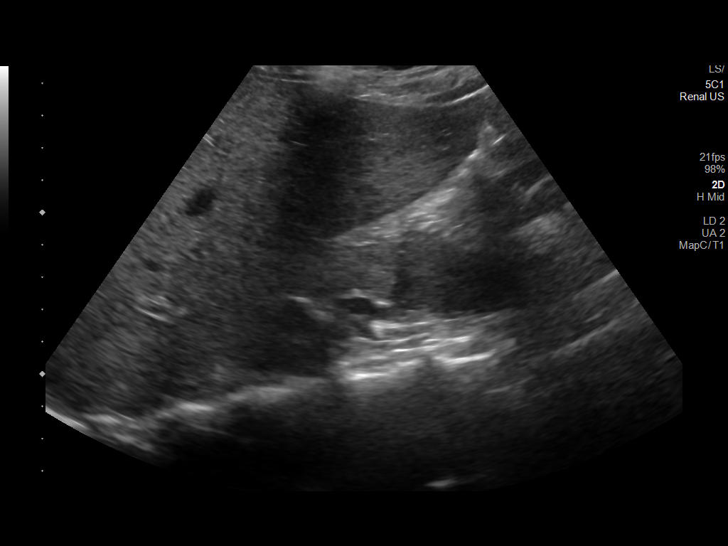
[im 12/48]
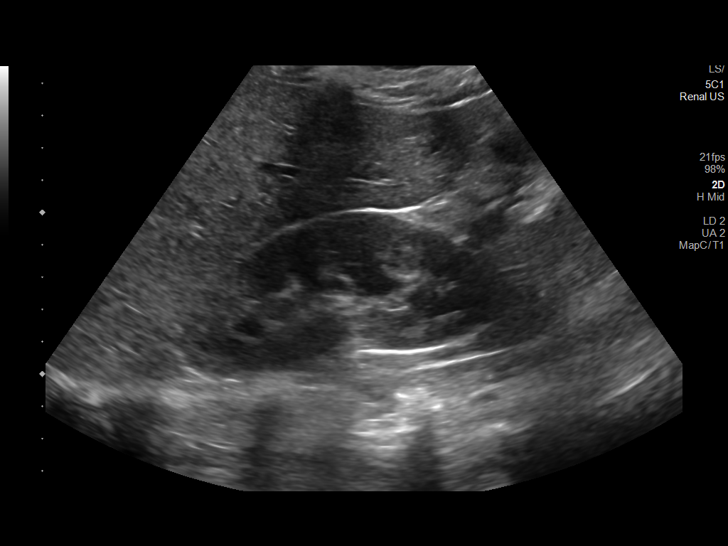
[im 16/48]
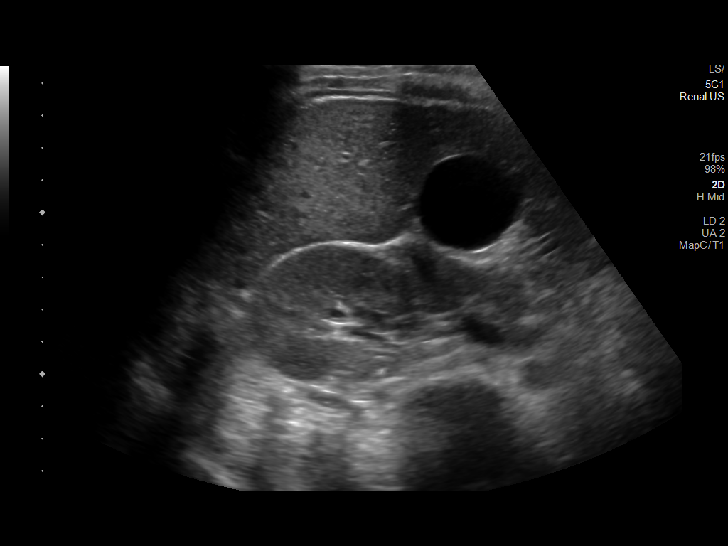
[im 18/48]
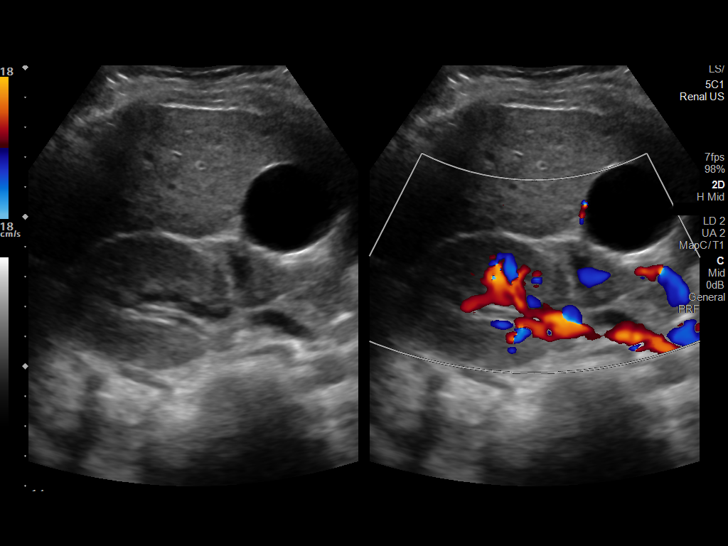
[im 22/48]
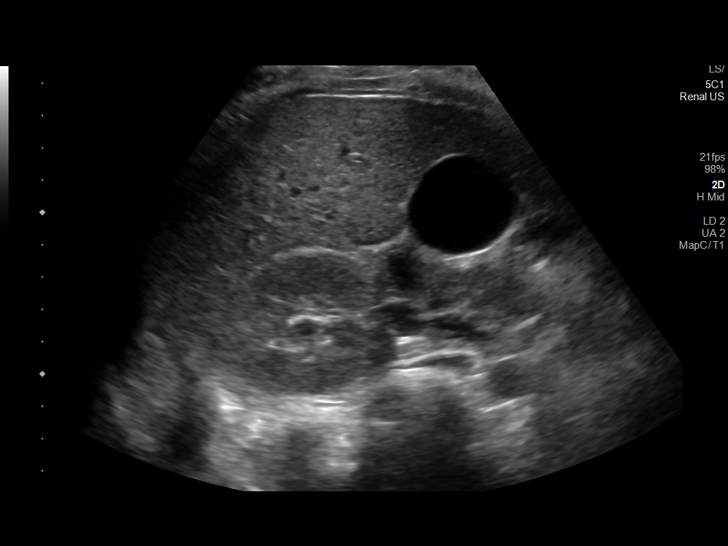
[im 26/48]
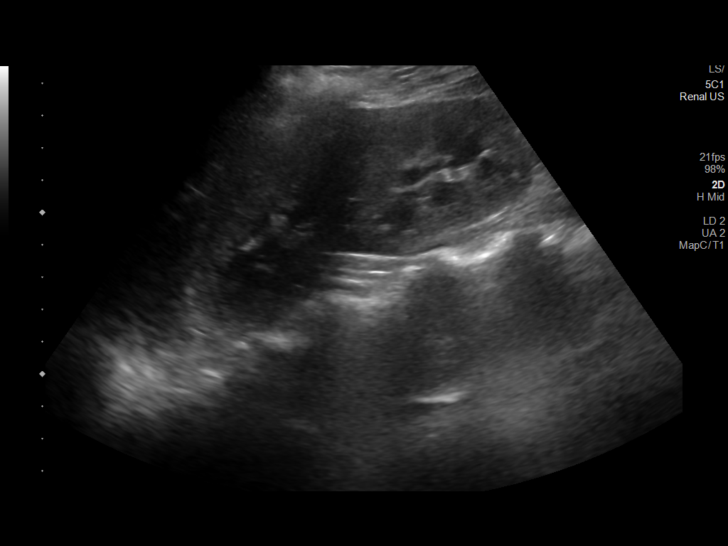
[im 30/48]
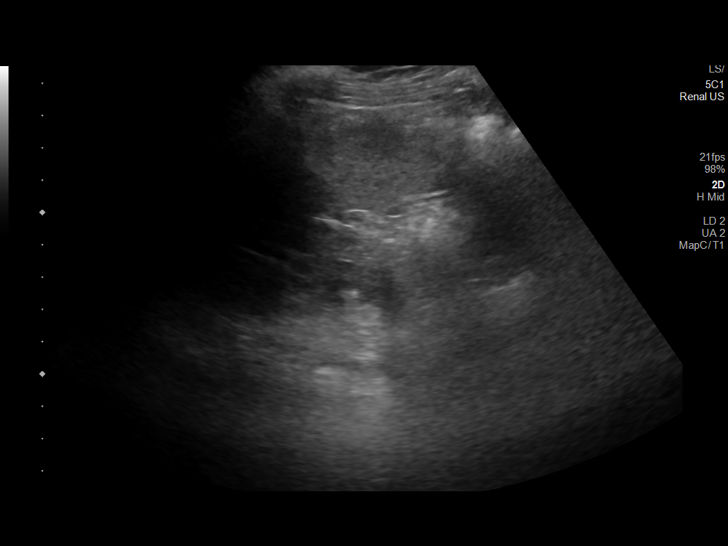
[im 32/48]
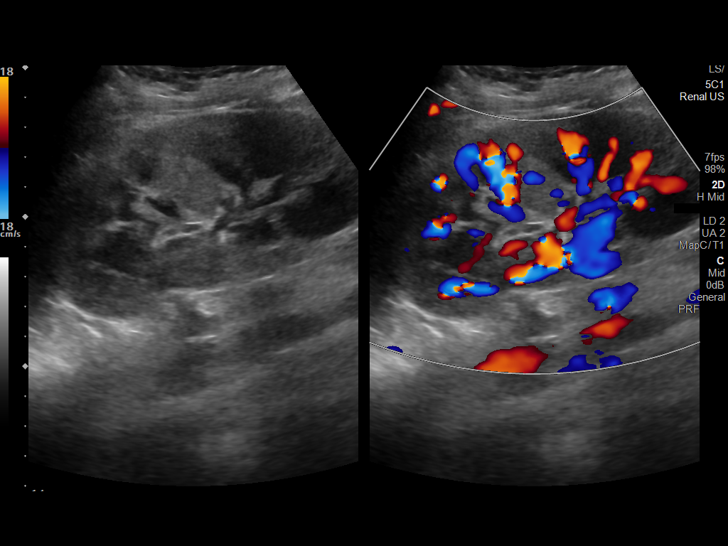
[im 36/48]
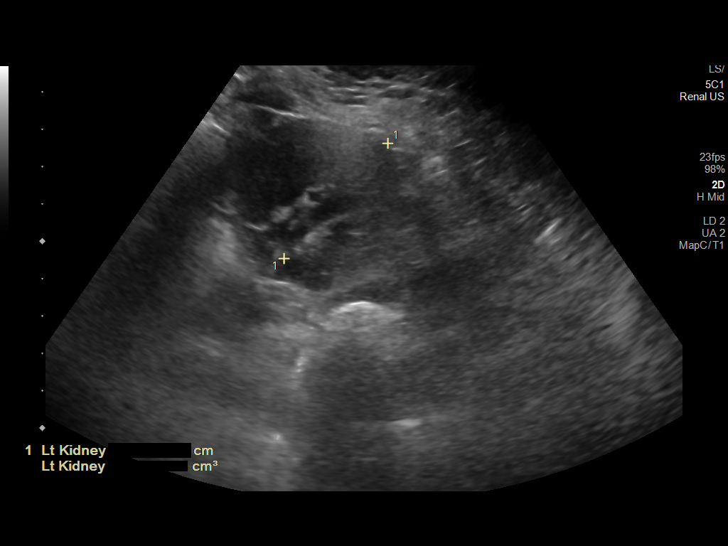
[im 40/48]
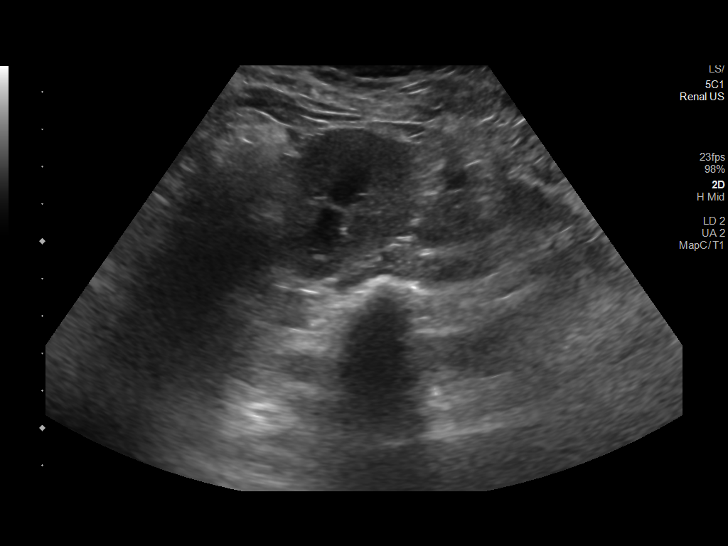
[im 44/48]
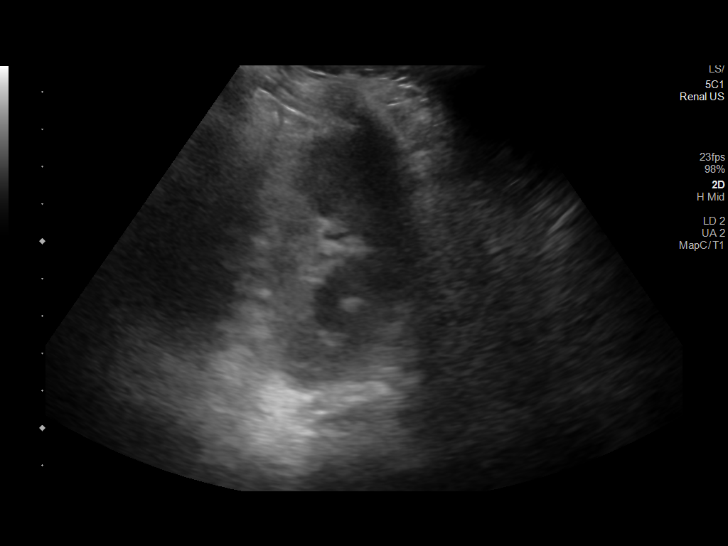
[im 48/48]
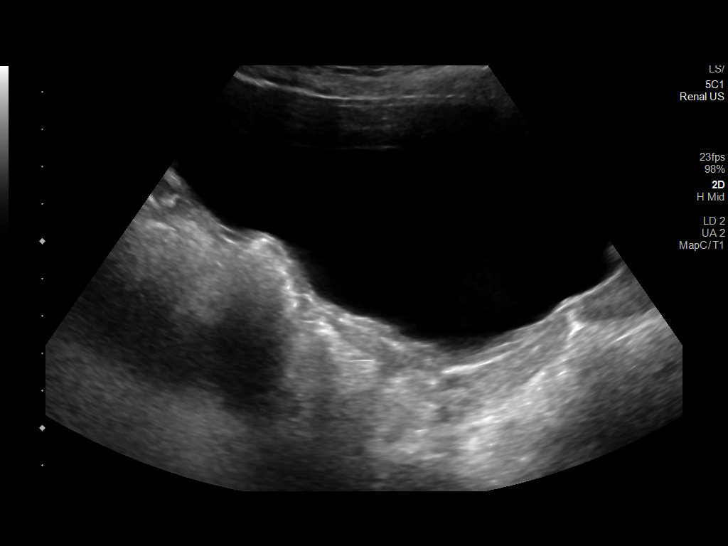

[14 of 25 positions shown; findings below may reference images not displayed]

FINDINGS: Right Kidney:

Renal measurements: 10.7 cm x 4.3 cm x 4.7 cm = volume: 112.78 mL.
Echogenicity within normal limits. No mass or hydronephrosis
visualized.

Left Kidney:

Renal measurements: 11.2 cm x 5.8 cm x 4.2 cm = volume: 141.70 a mL.
Echogenicity within normal limits. No mass or hydronephrosis
visualized.

Bladder:

Appears normal for degree of bladder distention.

Other:

None.
IMPRESSION: Normal renal ultrasound without evidence of an abnormality to
correspond to the low-attenuation lesion seen within the right
kidney on the prior abdomen pelvis CT. Further evaluation with
nonemergent MRI (without and with IV contrast) is recommended to
further characterize this lesion.

## 2021-03-10 IMAGING — CT CT ANGIO CHEST
2 of 6 series · 18 of 36 positions shown · IV contrast (agent unspecified)
Comparison: None.

CLINICAL DATA: Abdominal pain and suspected pulmonary embolism.

EXAM:
CT ANGIOGRAPHY CHEST WITH CONTRAST
TECHNIQUE: Multidetector CT imaging of the chest was performed using the
standard protocol during bolus administration of intravenous
contrast. Multiplanar CT image reconstructions and MIPs were
obtained to evaluate the vascular anatomy.

[Series 7: pe thins · axial · 0.64mm/px · z∈[+1033,+1250]mm · 17 of 245 slices shown]
[im 14/245  lung]
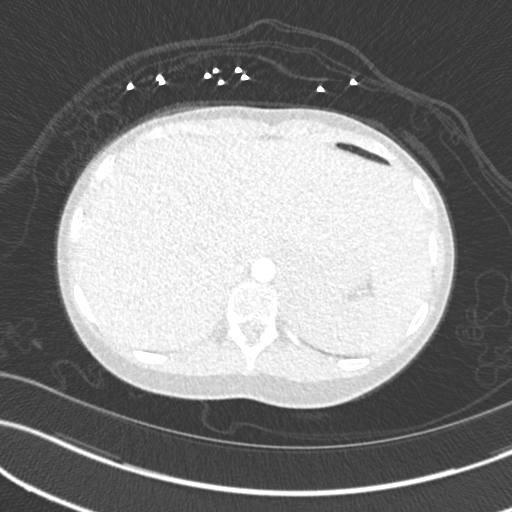
[im 28/245  mediastinal]
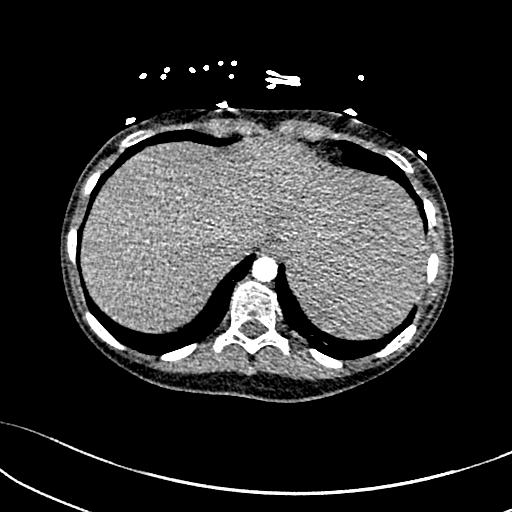
[im 41/245  lung]
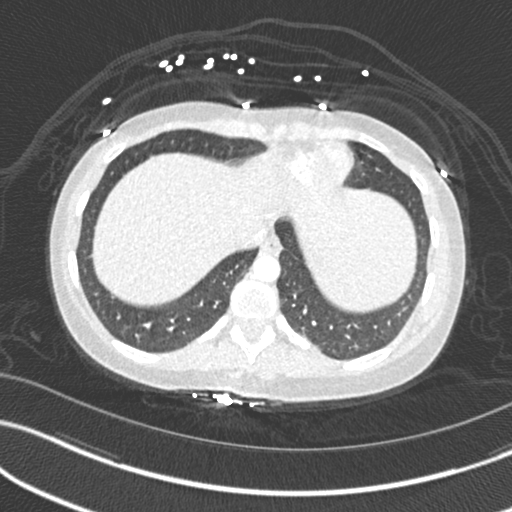
[im 55/245  mediastinal]
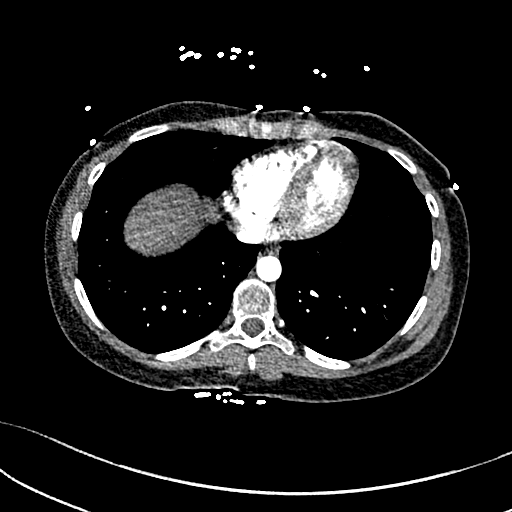
[im 68/245  lung]
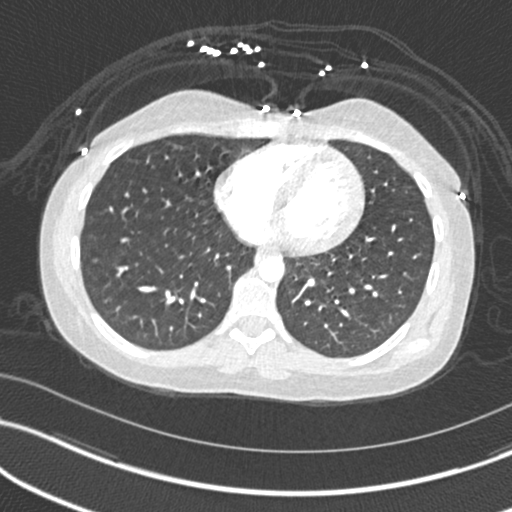
[im 82/245  mediastinal]
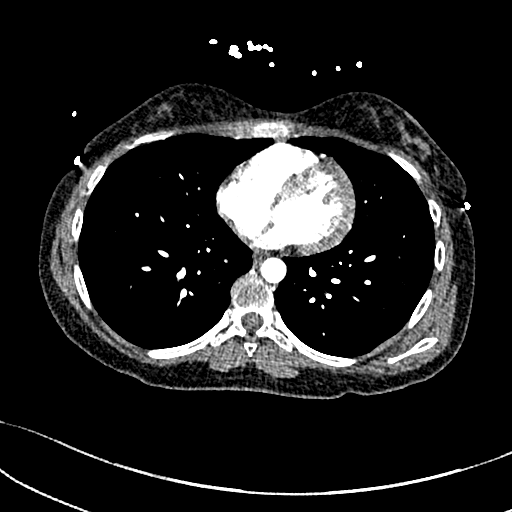
[im 95/245  lung]
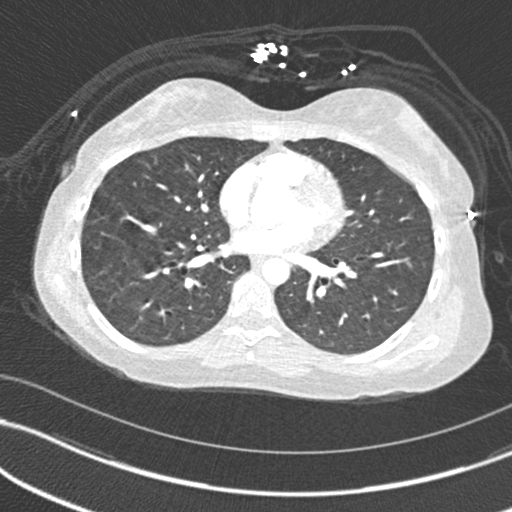
[im 109/245  mediastinal]
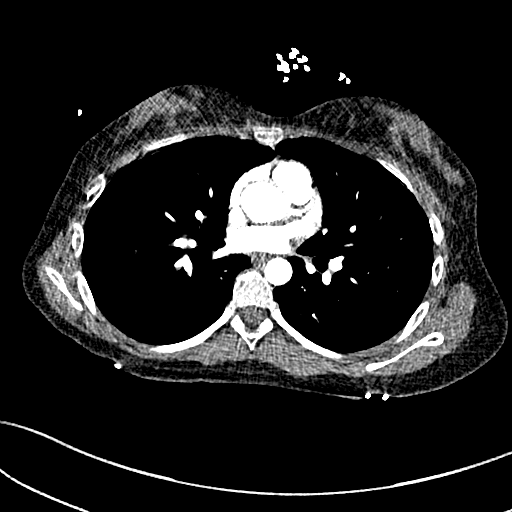
[im 123/245  lung]
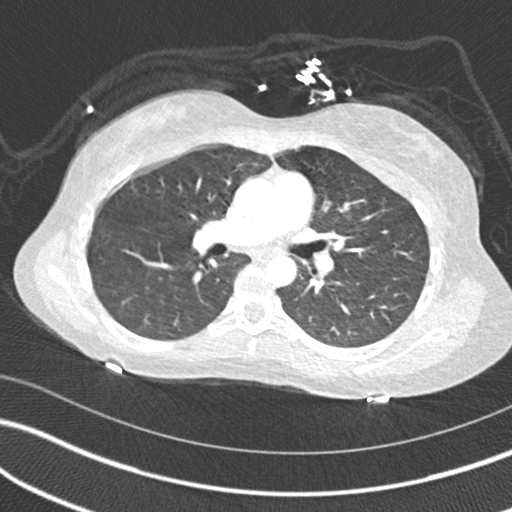
[im 136/245  mediastinal]
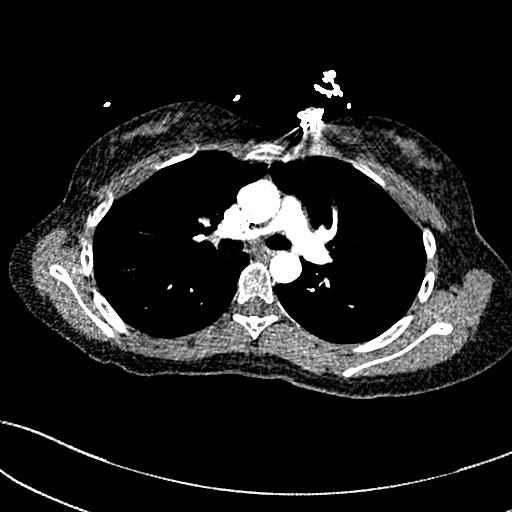
[im 150/245  lung]
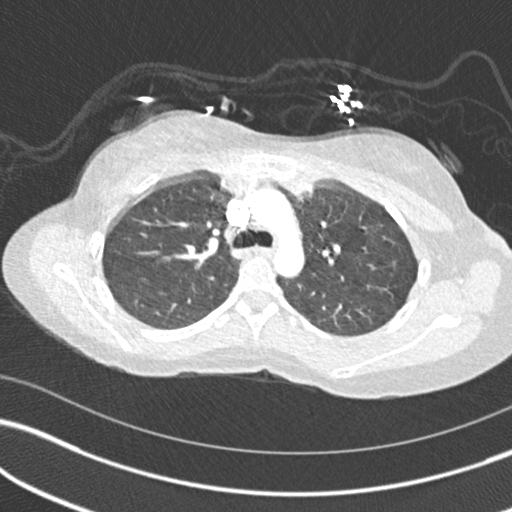
[im 163/245  mediastinal]
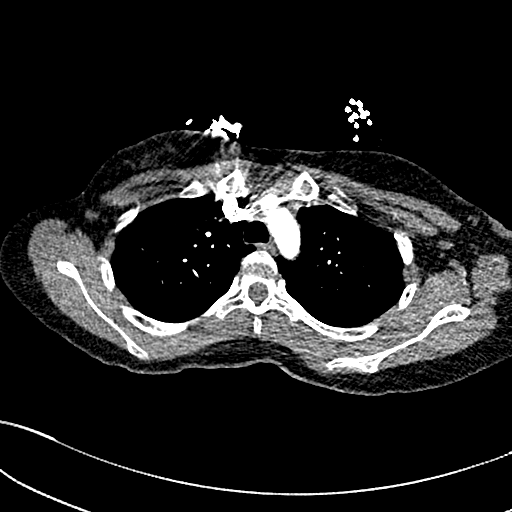
[im 177/245  lung]
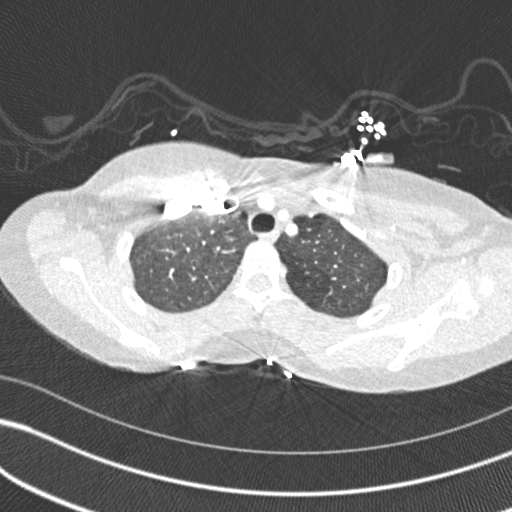
[im 190/245  mediastinal]
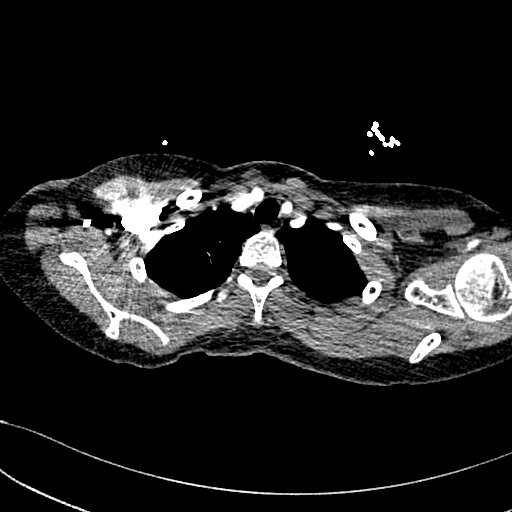
[im 204/245  lung]
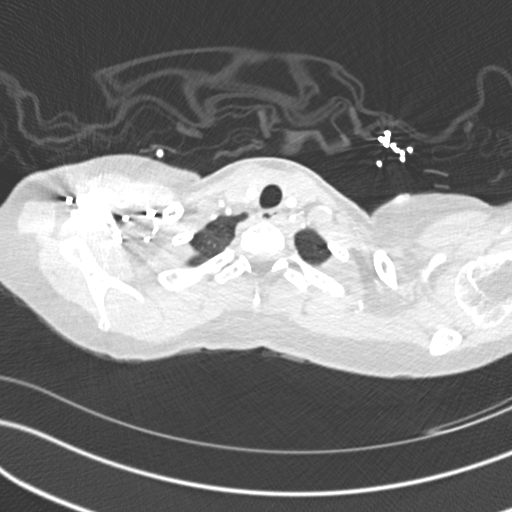
[im 217/245  mediastinal]
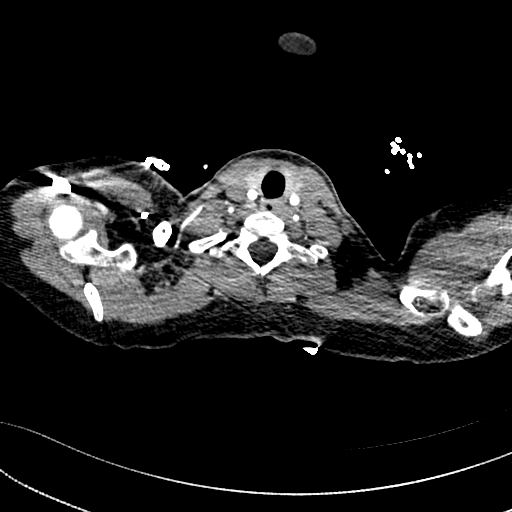
[im 231/245  lung]
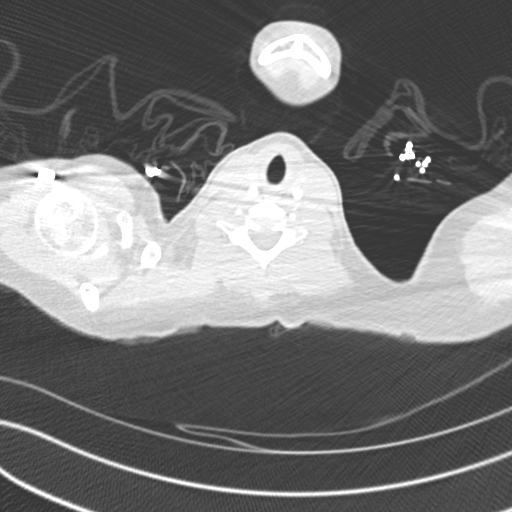

[Series 9: pe 2mm cor · coronal · 0.43mm/px · 1 of 97 slices shown]
[im 49/97  mediastinal]
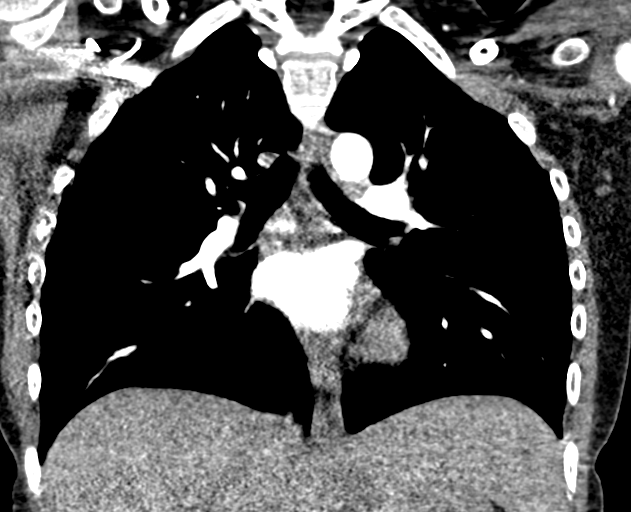

[18 of 36 positions shown; findings below may reference images not displayed]

RADIATION DOSE REDUCTION: This exam was performed according to the
departmental dose-optimization program which includes automated
exposure control, adjustment of the mA and/or kV according to
patient size and/or use of iterative reconstruction technique.

CONTRAST:  80mL OMNIPAQUE IOHEXOL 350 MG/ML SOLN
FINDINGS: Cardiovascular: Satisfactory opacification of the pulmonary arteries
to the segmental level. No evidence of pulmonary embolism. Normal
heart size. No pericardial effusion.

Mediastinum/Nodes: No enlarged mediastinal, hilar, or axillary lymph
nodes. Thyroid gland, trachea, and esophagus demonstrate no
significant findings.

Lungs/Pleura: Lungs are clear. No pleural effusion or pneumothorax.

Upper Abdomen: No acute abnormality.

Musculoskeletal: No chest wall abnormality. No acute or significant
osseous findings.

Review of the MIP images confirms the above findings.
IMPRESSION: No CT evidence of pulmonary embolism or acute cardiopulmonary
disease.

## 2021-03-10 IMAGING — CT CT ABD-PELV W/ CM
2 of 4 series · 16 of 46 positions shown, 18 images · IV contrast (agent unspecified)
Comparison: None.

CLINICAL DATA: Right lower quadrant abdominal pain

EXAM:
CT ABDOMEN AND PELVIS WITH CONTRAST
TECHNIQUE: Multidetector CT imaging of the abdomen and pelvis was performed
using the standard protocol following bolus administration of
intravenous contrast.

[Series 3: a/p w/ 5mm · axial · 0.81mm/px · z∈[+681,+1076]mm · 13 of 87 slices shown, 15 images]
[im 4/87  soft-tissue]
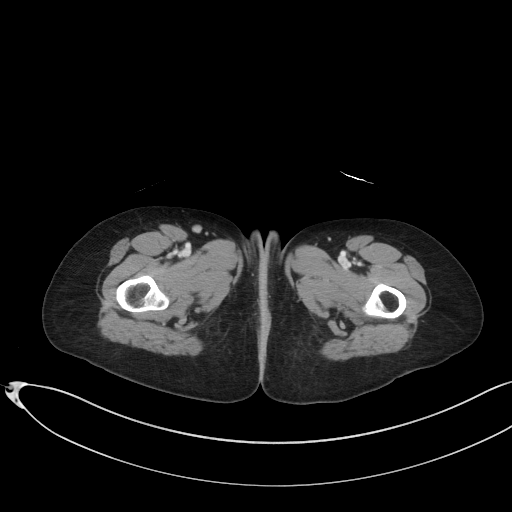
[im 4/87  bone]
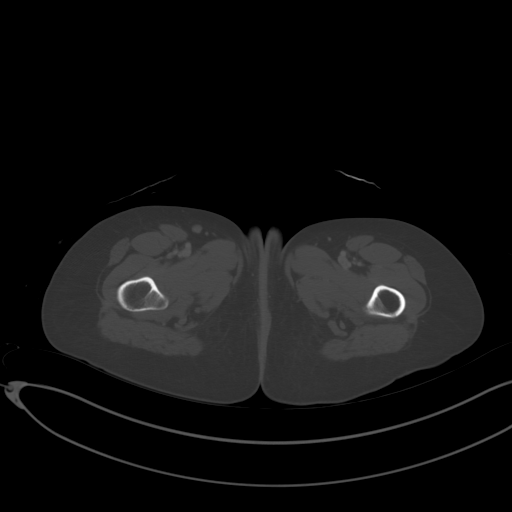
[im 10/87  soft-tissue]
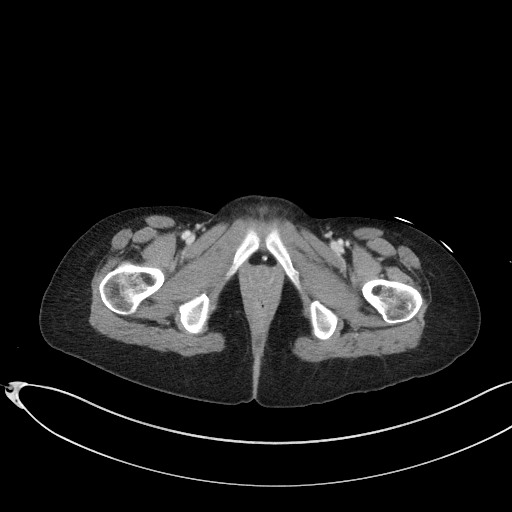
[im 17/87  soft-tissue]
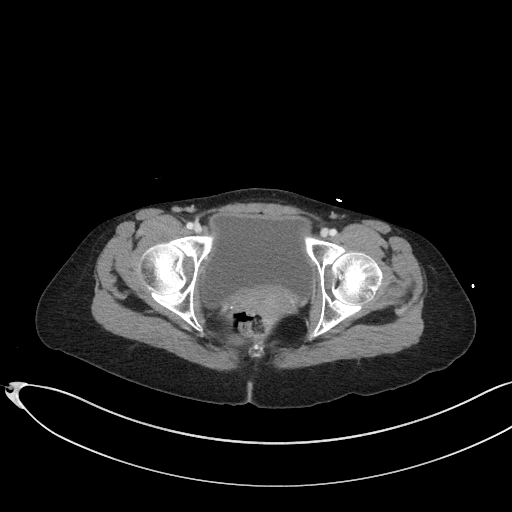
[im 24/87  soft-tissue]
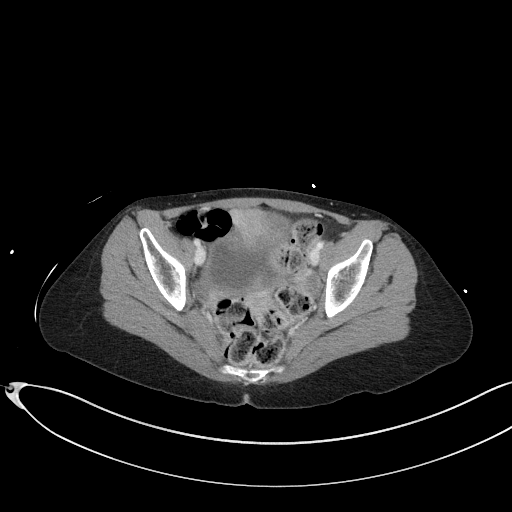
[im 30/87  soft-tissue]
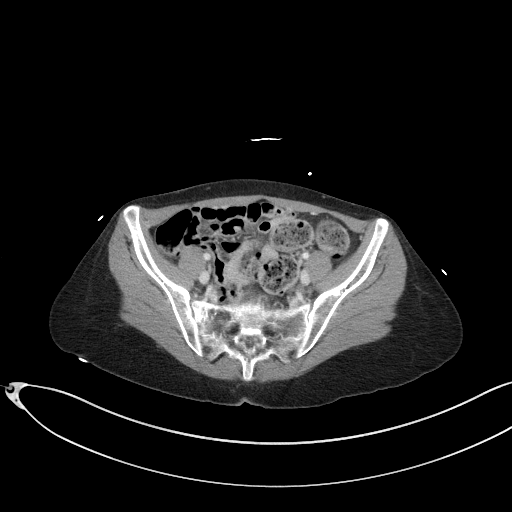
[im 37/87  soft-tissue]
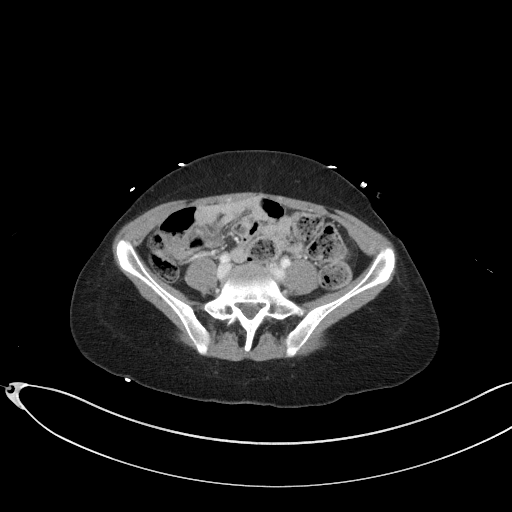
[im 44/87  soft-tissue]
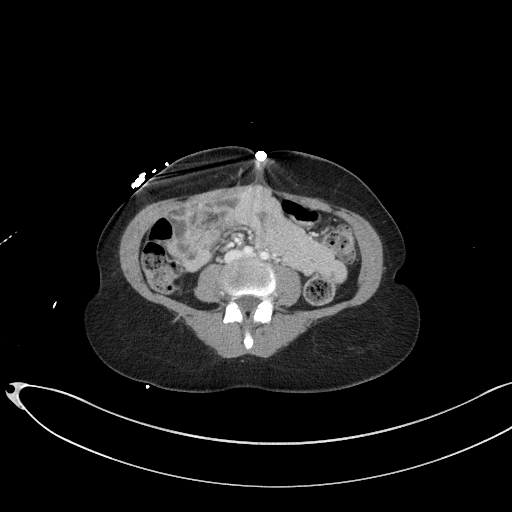
[im 50/87  soft-tissue]
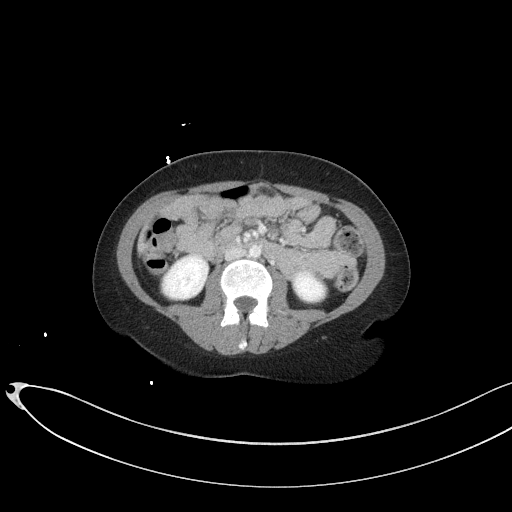
[im 57/87  soft-tissue]
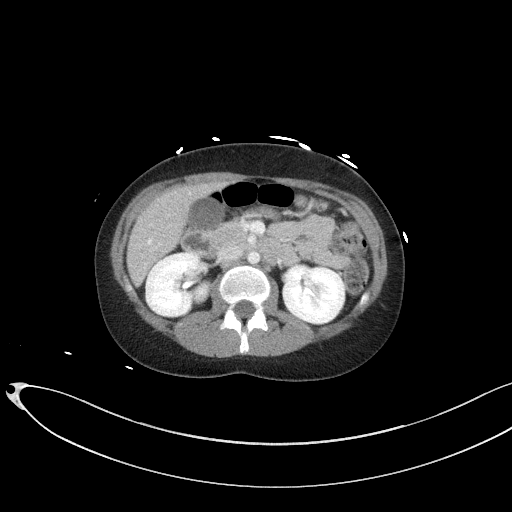
[im 57/87  bone]
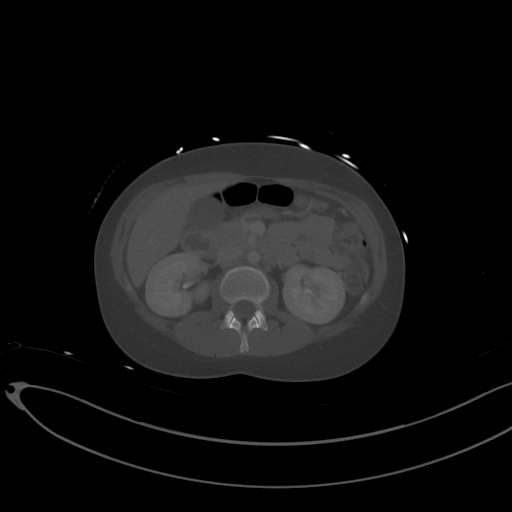
[im 63/87  soft-tissue]
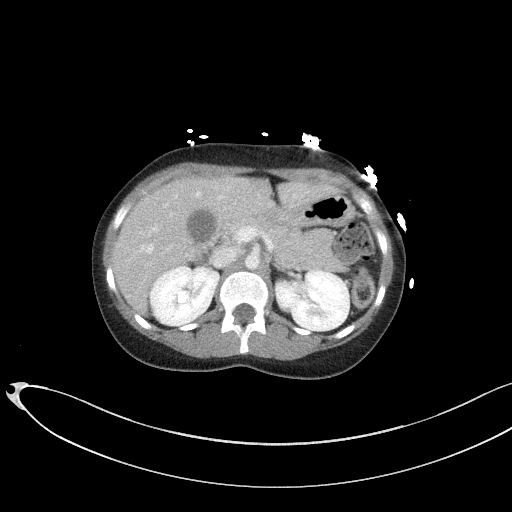
[im 70/87  soft-tissue]
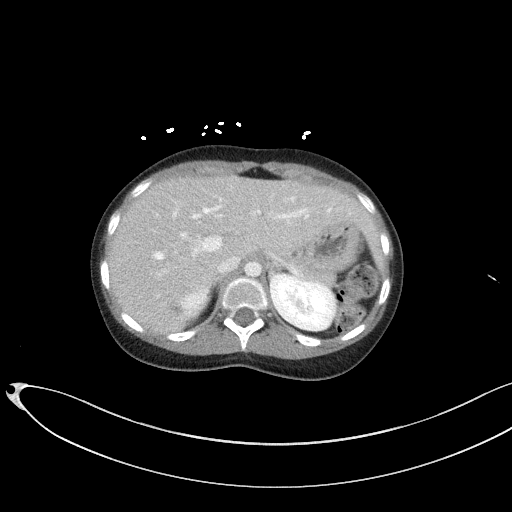
[im 77/87  soft-tissue]
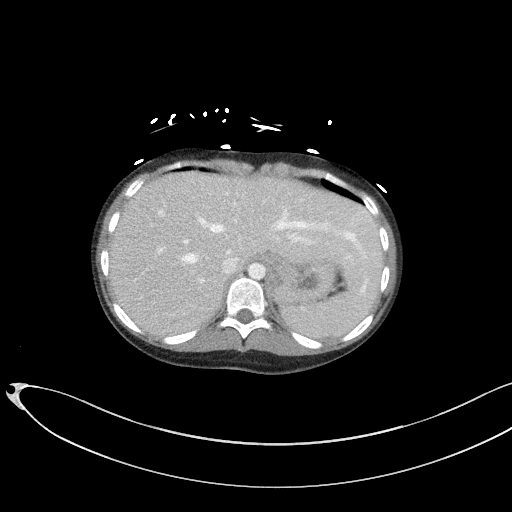
[im 83/87  soft-tissue]
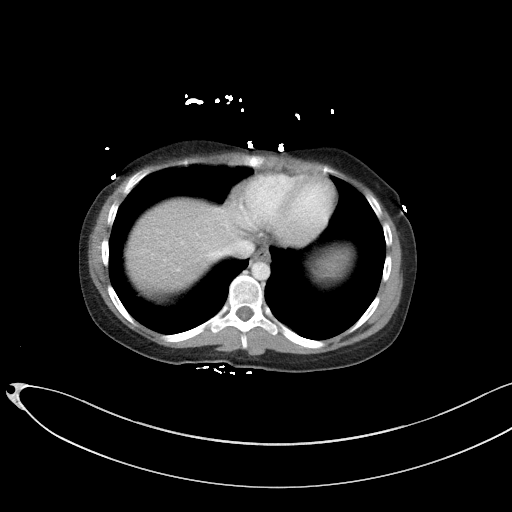

[Series 6: a/p w/ cor · coronal · 0.65mm/px · 3 of 108 slices shown]
[im 36/108  soft-tissue]
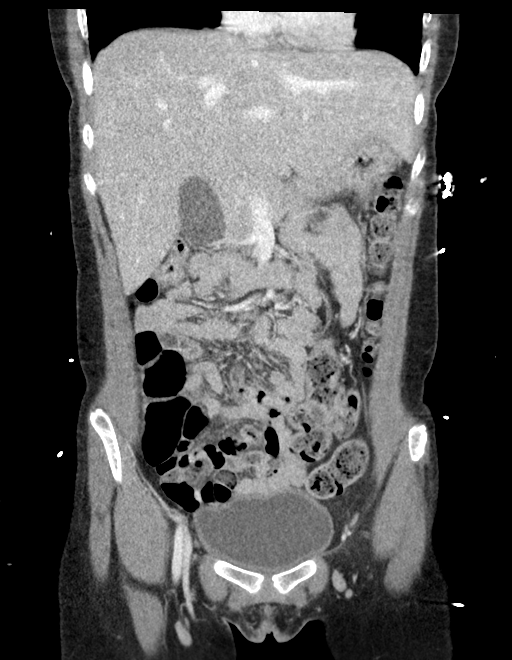
[im 48/108  soft-tissue]
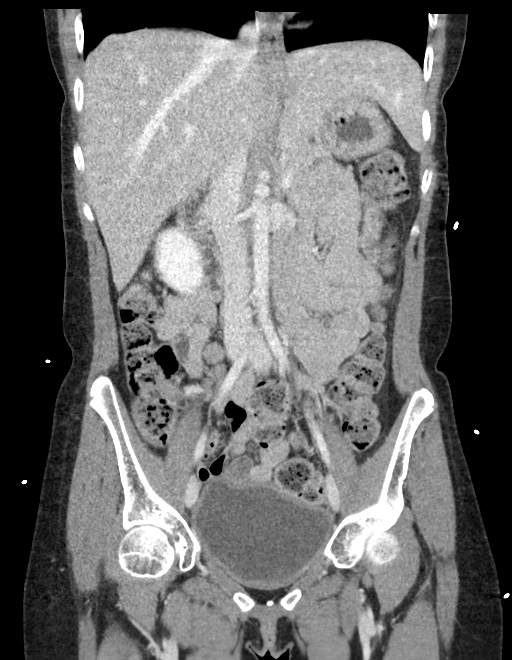
[im 60/108  soft-tissue]
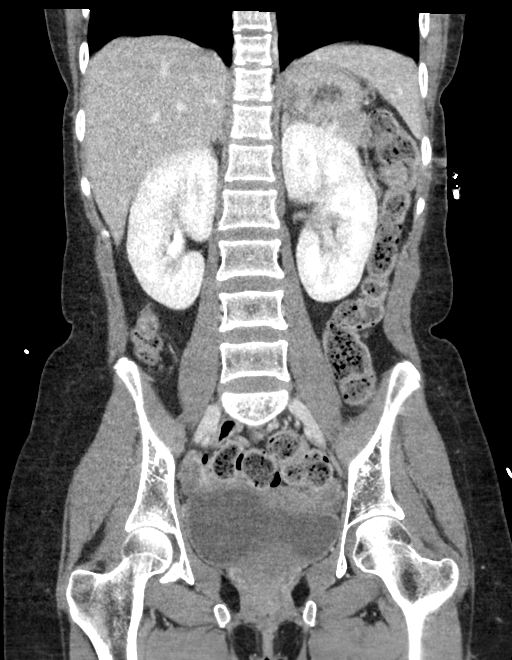

[16 of 46 positions shown; findings below may reference images not displayed]

RADIATION DOSE REDUCTION: This exam was performed according to the
departmental dose-optimization program which includes automated
exposure control, adjustment of the mA and/or kV according to
patient size and/or use of iterative reconstruction technique.

CONTRAST:  80mL OMNIPAQUE IOHEXOL 350 MG/ML SOLN
FINDINGS: Lower chest: No acute abnormality.

Hepatobiliary: No focal liver abnormality is seen. No gallstones,
gallbladder wall thickening, or biliary dilatation.

Pancreas: Unremarkable. No pancreatic ductal dilatation or
surrounding inflammatory changes.

Spleen: Normal in size without focal abnormality.

Adrenals/Urinary Tract: Adrenal glands are unremarkable. Kidneys are
normal in size, without renal calculi or hydronephrosis. A 10 mm x
11 mm x 10 mm ill-defined focus of parenchymal low attenuation is
seen within the posterior aspect of the lower pole of the right
kidney. Bladder is unremarkable.

Stomach/Bowel: Stomach is within normal limits. Appendix appears
normal. Stool is seen throughout the large bowel. No evidence of
bowel wall thickening, distention, or inflammatory changes.

Vascular/Lymphatic: No significant vascular findings are present. No
enlarged abdominal or pelvic lymph nodes.

Reproductive: Uterus and bilateral adnexa are unremarkable.

Other: No abdominal wall hernia or abnormality. No abdominopelvic
ascites.

Musculoskeletal: No acute or significant osseous findings.
IMPRESSION: 1. Findings which may represent a small right renal cyst.
Correlation with nonemergent renal ultrasound is recommended.
2. Normal appendix.

## 2021-03-10 MED ORDER — ALBUTEROL SULFATE (2.5 MG/3ML) 0.083% IN NEBU: 2.5000 mg | INHALATION_SOLUTION | Freq: Four times a day (QID) | RESPIRATORY_TRACT | Status: DC | PRN

## 2021-03-10 MED ORDER — DEXTROSE 50 % IV SOLN: 25.0000 g | Freq: Once | INTRAVENOUS | Status: AC

## 2021-03-10 MED ORDER — HEPARIN SODIUM (PORCINE) 5000 UNIT/ML IJ SOLN
5000.0000 [IU] | Freq: Three times a day (TID) | INTRAMUSCULAR | Status: DC
  Filled 2021-03-10: qty 1

## 2021-03-10 MED ORDER — POTASSIUM CHLORIDE CRYS ER 20 MEQ PO TBCR
40.0000 meq | EXTENDED_RELEASE_TABLET | Freq: Once | ORAL | Status: AC
  Administered 2021-03-10: 40 meq via ORAL
  Filled 2021-03-10: qty 2

## 2021-03-10 MED ORDER — ENOXAPARIN SODIUM 40 MG/0.4ML IJ SOSY
40.0000 mg | PREFILLED_SYRINGE | INTRAMUSCULAR | Status: DC
  Administered 2021-03-10: 40 mg via SUBCUTANEOUS
  Filled 2021-03-10 (×2): qty 0.4

## 2021-03-10 MED ORDER — INSULIN GLARGINE-YFGN 100 UNIT/ML ~~LOC~~ SOLN
20.0000 [IU] | Freq: Every day | SUBCUTANEOUS | Status: DC
  Administered 2021-03-10: 20 [IU] via SUBCUTANEOUS
  Filled 2021-03-10 (×2): qty 0.2

## 2021-03-10 MED ORDER — DEXTROSE IN LACTATED RINGERS 5 % IV SOLN
INTRAVENOUS | Status: DC
Start: 2021-03-10 — End: 2021-03-10

## 2021-03-10 MED ORDER — SERTRALINE HCL 50 MG PO TABS
75.0000 mg | ORAL_TABLET | Freq: Every day | ORAL | Status: DC
  Administered 2021-03-10 – 2021-03-11 (×2): 75 mg via ORAL
  Filled 2021-03-10: qty 1
  Filled 2021-03-10: qty 2

## 2021-03-10 MED ORDER — SODIUM CHLORIDE 0.9 % IV SOLN
1.0000 g | Freq: Once | INTRAVENOUS | Status: AC
  Administered 2021-03-10: 1 g via INTRAVENOUS
  Filled 2021-03-10: qty 10

## 2021-03-10 MED ORDER — DEXTROSE 50 % IV SOLN
12.5000 g | INTRAVENOUS | Status: AC
  Administered 2021-03-10: 12.5 g via INTRAVENOUS
  Filled 2021-03-10: qty 50

## 2021-03-10 MED ORDER — IOHEXOL 350 MG/ML SOLN
80.0000 mL | Freq: Once | INTRAVENOUS | Status: AC | PRN
Start: 2021-03-10 — End: 2021-03-10
  Administered 2021-03-10: 80 mL via INTRAVENOUS

## 2021-03-10 MED ORDER — INSULIN ASPART 100 UNIT/ML IJ SOLN
0.0000 [IU] | Freq: Three times a day (TID) | INTRAMUSCULAR | Status: DC
  Administered 2021-03-10: 9 [IU] via SUBCUTANEOUS
  Administered 2021-03-11: 12:00:00 2 [IU] via SUBCUTANEOUS
  Administered 2021-03-11: 08:00:00 5 [IU] via SUBCUTANEOUS

## 2021-03-10 MED ORDER — DEXTROSE 50 % IV SOLN
INTRAVENOUS | Status: AC
  Administered 2021-03-10: 25 g via INTRAVENOUS
  Filled 2021-03-10: qty 50

## 2021-03-10 NOTE — Progress Notes (Addendum)
FPTS Interim Progress Note ? ?S:Patient sleeping on my exam. Arousable and alert to voice but does not participate in speaking with me, appears comfortable, non-diaphoretic with stable vital signs. ? ?O: ?BP 120/86   Pulse (!) 120   Temp 98.8 ?F (37.1 ?C) (Oral)   Resp 19   Ht 5\' 1"  (1.549 m)   Wt 47.6 kg   SpO2 100%   BMI 19.84 kg/m?   ?General: Resting comfortably in bed ?CV: RRR ?Lungs: Clear to auscultation anteriorly ?Abdomen: Soft, non-distended ?Ext: Warm, dry, no edema ? ?A/P: ?Type 1 DM, anion gap metabolic acidosis, with likely DKA ?Hyperglycemia likely self-resolved with excessive insulin administration prior to arrival (80u lantus, 20u subq insulin), but given anion gap acidosis in setting of T1DM likely in DKA. ?S/p 2L IVF and D50. Will start IV D5LR and insulin administration to ensure resolution of anion gap. K+ at 3.5, will replete and continue to do so. CBG remain stable without low or high <90 or >300. Most recent CBG 137. ?- Monitor CBG q1 hour, re-evaluate this afternoon and can stretch out to q4h if stable glucose readings ?- Start sensitive SSI when patient eats meals ?- Start lantus 20u (half home dose in effort to avoid hypoglycemia) this evening. Re-dose tomorrow ?- D50 12.5g in setting of hypoglycemia (<70) ?- Diabetes coordinator following, appreciate reccs ?- Other plan per H&P (Strict I/Os, BMP q4h, BHB q8h) ? ?Nausea/vomiting/diarrhea, likely 2/2 dehydration resulting in anion gap acidosis ?UTI concern ?Improved. S/p CTX in ED. UA likely not clean catch given epithelial cells 6-10, although few bacteria present with leukocytes. Not convincing enough to continue antibiotics.CT unremarkable.  ?- If UTI sx's  (dysuria, abdominal pain, fever), will obtain urine culture and restart antibiotics ?- Follow urine culture, if returns + for bacteria, will treat with abx ? ?Sinus tachycardia ?HR in 110s,even while sleeping. Likely 2/2 dehydration from DKA. ?- IVF @ 75 ml/hr ?- Monitor  VS ? ?Right kidney lesion ?Renal U/s without evidence of abnormality to low-attenuation lesion seen on R kidney on CT abdomen/pelvis. ?- Outpatient MRI follow up recomended ? ?Other conditions chronic and stable, resume home medications: ?Asthma, MDD ? ?Orvis Brill, DO ?03/10/2021, 8:20 AM ?PGY-1, [place] ?Service pager [phone]  ?

## 2021-03-10 NOTE — Progress Notes (Signed)
Inpatient Diabetes Program Recommendations ? ?AACE/ADA: New Consensus Statement on Inpatient Glycemic Control (2015) ? ?Target Ranges:  Prepandial:   less than 140 mg/dL ?     Peak postprandial:   less than 180 mg/dL (1-2 hours) ?     Critically ill patients:  140 - 180 mg/dL  ? ?Lab Results  ?Component Value Date  ? GLUCAP 127 (H) 03/10/2021  ? HGBA1C 14.0 (H) 02/07/2021  ? ? ?Review of Glycemic Control ? Latest Reference Range & Units 03/10/21 01:31 03/10/21 02:49 03/10/21 04:12 03/10/21 05:17 03/10/21 05:50 03/10/21 06:22 03/10/21 07:15 03/10/21 08:03  ?Glucose-Capillary 70 - 99 mg/dL 315 (H) 176 (H) 160 (H) 79 62 (L) 119 (H) 137 (H) 127 (H)  ?(H): Data is abnormally high ?(L): Data is abnormally low ? ?Diabetes history: DM1 ?Outpatient Diabetes medications: Lantus 30 units q hs, Humalog 10 units tid meal coverage, add 1 unit correction for every 50 above 150 ?Current orders for Inpatient glycemic control: None ? ?Inpatient Diabetes Program Recommendations:   ?Spoke with patient regarding diabetes management and CBGs. ?Patient just received insulin pump but needs instruction on starting. Referred patient to call Dr. Lacie Draft office to set up appointment for insulin pump training. ?Notified Dr. Lacie Draft office that patient is needing assistance with insulin pump and currently in the ED. Patient last office appointment with Dr. Shawnee Knapp was 02/10/21 and Diabetes coordinator spoke with patient last on 02/08/21. ? ?Patient will need to remain on subcutaneous insulin until receives training on new insulin pump. ? ?Thank you, ?Billy Fischer Julianah Marciel, RN, MSN, CDE  ?Diabetes Coordinator ?Inpatient Glycemic Control Team ?Team Pager [phone] (8am-5pm) ?03/10/2021 9:48 AM ? ? ? ? ?

## 2021-03-10 NOTE — ED Provider Notes (Signed)
I assumed care at signout to follow-up on patient.  Plan was to give IV fluids, check CT chest abdomen pelvis and then admit. ?Patient resting comfortably no acute distress.  She denies any pain at this time.  Her tachycardia is improving. ?Patient has signs of metabolic acidosis however her glucose has been improving after taking insulin at home ?  ?Zadie Rhine, MD ?03/10/21 0016 ? ?

## 2021-03-10 NOTE — ED Provider Notes (Signed)
Patient now improving, awake and alert and eating crackers. ?Discussed with family medicine service for admission ? ?.Critical Care ?Performed by: Zadie Rhine, MD ?Authorized by: Zadie Rhine, MD  ? ?Critical care provider statement:  ?  Critical care time (minutes):  40 ?  Critical care start time:  03/10/2021 12:45 AM ?  Critical care end time:  03/10/2021 1:25 AM ?  Critical care time was exclusive of:  Separately billable procedures and treating other patients ?  Critical care was necessary to treat or prevent imminent or life-threatening deterioration of the following conditions:  Dehydration, endocrine crisis and metabolic crisis ?  Critical care was time spent personally by me on the following activities:  Discussions with consultants, evaluation of patient's response to treatment, examination of patient, re-evaluation of patient's condition, ordering and review of laboratory studies and ordering and performing treatments and interventions ?  I assumed direction of critical care for this patient from another provider in my specialty: yes   ?  Care discussed with: admitting provider   ? ?  ?Zadie Rhine, MD ?03/10/21 0127 ? ?

## 2021-03-10 NOTE — ED Provider Notes (Signed)
CT imaging is negative ?Patient found to have a glucose of 27.  She was given D50, she is awake and alert and will drink juice.  She will be admitted ?  ?Zadie Rhine, MD ?03/10/21 0107 ? ?
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
## 2021-03-11 DIAGNOSIS — F331 Major depressive disorder, recurrent, moderate: Secondary | ICD-10-CM

## 2021-03-11 DIAGNOSIS — R112 Nausea with vomiting, unspecified: Secondary | ICD-10-CM

## 2021-03-11 DIAGNOSIS — R739 Hyperglycemia, unspecified: Secondary | ICD-10-CM

## 2021-03-11 LAB — GLUCOSE, CAPILLARY
Glucose-Capillary: 190 mg/dL — ABNORMAL HIGH (ref 70–99)
Glucose-Capillary: 233 mg/dL — ABNORMAL HIGH (ref 70–99)
Glucose-Capillary: 292 mg/dL — ABNORMAL HIGH (ref 70–99)

## 2021-03-11 LAB — BASIC METABOLIC PANEL
Anion gap: 11 (ref 5–15)
BUN: 14 mg/dL (ref 6–20)
CO2: 20 mmol/L — ABNORMAL LOW (ref 22–32)
Calcium: 9.2 mg/dL (ref 8.9–10.3)
Chloride: 106 mmol/L (ref 98–111)
Creatinine, Ser: 0.7 mg/dL (ref 0.44–1.00)
GFR, Estimated: >60 mL/min (ref >60)
Glucose, Bld: 257 mg/dL — ABNORMAL HIGH (ref 70–99)
Potassium: 3.5 mmol/L (ref 3.5–5.1)
Sodium: 137 mmol/L (ref 135–145)

## 2021-03-11 MED ORDER — SERTRALINE HCL 50 MG PO TABS: 75.0000 mg | ORAL_TABLET | Freq: Every day | ORAL | 3 refills | Status: DC

## 2021-03-11 MED ORDER — INSULIN LISPRO (1 UNIT DIAL) 100 UNIT/ML (KWIKPEN): 5.0000 [IU] | PEN_INJECTOR | Freq: Three times a day (TID) | SUBCUTANEOUS | 1 refills | Status: DC

## 2021-03-11 MED ORDER — LANTUS SOLOSTAR 100 UNIT/ML ~~LOC~~ SOPN: 30.0000 [IU] | PEN_INJECTOR | Freq: Every day | SUBCUTANEOUS | 11 refills | Status: DC

## 2021-03-11 MED ORDER — POTASSIUM CHLORIDE CRYS ER 20 MEQ PO TBCR
40.0000 meq | EXTENDED_RELEASE_TABLET | Freq: Once | ORAL | Status: AC
  Administered 2021-03-11: 11:00:00 40 meq via ORAL
  Filled 2021-03-11: qty 2

## 2021-03-11 NOTE — Discharge Instructions (Addendum)
Dear [name],  ? ?Thank you for letting us participate in your care! In this section, you will find a brief hospital admission summary of why you were admitted to the hospital, what happened during your admission, your diagnosis/diagnoses, and recommended follow up.  ?You were admitted because you were experiencing high blood sugars, dehydration and concern for diabetic ketoacidosis.  ?You were treated with IV fluids, insulin, and diabetes education.  ?Please start using your DexCom at home to monitor your glucose levels. Use your insulin regimen as prescribed- Lantus 30units daily with 5 units of short-acting insulin at mealtime. ? ? ?POST-HOSPITAL & CARE INSTRUCTIONS ?Please go to your follow-up appointments listed below. ?Please let PCP/Specialists know of any changes in medications that were made.  ?Please see medications section of this packet for any medication changes.  ? ?DOCTOR'S APPOINTMENTS & FOLLOW UP ? Follow-up Information   ? ? Derrel Nip, MD. Nyra Capes on 03/16/2021.   ?Specialty: Family Medicine ?Why: Please arrive to the clinic at 2:55pm for your appointment at 3:10pm. ?Contact information: ?[address] ?[place] [address] ?[phone] ? ? ?  ?  ? ? Betsey Amen, MD. Go on 04/07/2021.   ?Specialty: Endocrinology ?Contact information: ?[address] ?[address] ?[place] [address] ?[phone] ? ? ?  ?  ? ?  ?  ? ?  ?No follow-ups on file.  ? ? ?Thank you for choosing [facility]! Take care and be well! ? ?Family Medicine Teaching Service Inpatient Team ?[facility]  ?[facility]  ?[address] [place], [place] [address] ?[phone] ? ?

## 2021-03-11 NOTE — Discharge Summary (Signed)
Family Medicine Teaching Service ?Hospital Discharge Summary ? ?Patient name: Dawn [name] Medical record number: [id number] ?Date of birth: Aug 06, 2000 Age: 21 y.o. Gender: female ?Date of Admission: 03/09/2021  Date of Discharge: 03/11/21 ?Admitting Physician: Wells Guiles, DO ? ?Primary Care Provider: [name], MD ?Consultants: None ? ?Indication for Hospitalization: Hyperglycemia ? ?Discharge Diagnoses/Problem List:  ?Principal Problem: ?  Nausea and vomiting ?Active Problems: ?  Maladaptive health behaviors affecting medical condition ?  MDD (major depressive disorder), recurrent episode, moderate ([place]) ? ?Disposition: Home ? ?Discharge Condition: Stable ? ?Discharge Exam:  ?General: Well-appearing, thin, comfortable, resting in bed ?HEENT: Pupils PERRLA, EOMI. MMM ?CV: RRR ?Respiratory: Clear in all fields, normal work of breathing ?Abdomen: Soft, non-tender, non-distended ?Ext: Warm, dry ? ?Brief Hospital Course:  ?Dawn [name] is a 21 y.o.female with a history of T1DM, asthma, MDD, migraines who was admitted to the [facility] Teaching Service at [facility] for DKA with episode of hypoglycemia. Her hospital course is detailed below: ? ?DKA w/ hypoglycemia ?Patient admitted for elevated glucose prior to arrival in the ED and had taken 80 units of Lantus and 20 units of Humalog that day without eating.  Upon arrival, CBG 175 eventually dropped to 27.  VBG with pH 7.28 and BMP notable for CO2 10, AG 17.  BHB 3.72.  Patient received 2 L of LR boluses.  Her CBGs were monitored closely and given treatment as necessary for hypoglycemic ranges.  BMP monitored every 4 hours and BHB monitoring every 8 hours.  Upon discharge, patient had closed anion gap with normal electrolytes. Pt was discharged with instructions to apply Dexcom when she gets home and to use 30 units Lantus daily and 5 units insulin at mealtime. She will need excessive diabetes education. ? ??Renal cyst ?Upon arrival, patient was concerned of  nausea/vomiting/diarrhea that is existent a couple days prior in addition to questionable abdominal pain.  Beta-hCG quant negative.  D-dimer elevated to 0.94 but CT PE negative.  UA with moderate leuks, WBC 11-20 but few bacteria.  Patient received CTX x1 g in the ED but given no UTI-like sxs, monitored during hospitalization without further treatment. CT abdomen pelvis showed a small right renal cyst and normal appendix.  Renal ultrasound normal but recommending MRI for further characterization of questionable renal cyst. ?  ?Other chronic conditions were medically managed with home medications and formulary alternatives as necessary (asthma, MDD, migraines) ? ?PCP Follow-up Recommendations: ?MR for potential renal cyst ?Ensure adequate glucose control and that she is checking glucose levels. She should have endocrinology follow up on 4/5. ? ? ?Significant Labs and Imaging:  ?Recent Labs  ?Lab 03/09/21 ?2230 03/09/21 ?2305 03/10/21 ?0226 03/10/21 ?6384  ?WBC 10.4  --  8.2  --   ?HGB 15.3* 17.0* 12.9 14.6  ?HCT 48.3* 50.0* 40.3 43.0  ?PLT 361  --  293  --   ? ?Recent Labs  ?Lab 03/10/21 ?0226 03/10/21 ?0233 03/10/21 ?6659 03/10/21 ?1500 03/10/21 ?1818 03/10/21 ?2214 03/11/21 ?0200  ?NA 133*   < > 135 133* 133* 134* 137  ?K 3.8   < > 3.4* 4.7 3.9 4.0 3.5  ?CL 103   < > 108 107 104 106 106  ?CO2 13*  --  16* 16* 15* 16* 20*  ?GLUCOSE 173*   < > 122* 370* 412* 286* 257*  ?BUN 19   < > 11 13 14 15 14   ?CREATININE 0.94   < > 0.65 0.80 0.88 0.74 0.70  ?CALCIUM 9.5  --  9.7 9.1 8.9 9.3 9.2  ?PHOS  --   --  3.2  --   --   --   --   ?ALKPHOS 80  --   --   --   --   --   --   ?AST 15  --   --   --   --   --   --   ?ALT 16  --   --   --   --   --   --   ?ALBUMIN 3.4*  --   --   --   --   --   --   ? < > = values in this interval not displayed.  ? ? ?Results/Tests Pending at Time of Discharge: None ? ?Discharge Medications:  ?Allergies as of 03/11/2021   ? ?   Reactions  ? Latex Other (See Comments)  ? Causes skin irritation  ?  Tape Other (See Comments)  ? Causes skin irritation  ? Shellfish Allergy Rash, Other (See Comments)  ? Reaction to scallops  ? ?  ? ?  ?Medication List  ?  ? ?TAKE these medications   ? ?Accu-Chek Guide test strip ?Generic drug: glucose blood ?Use to check glucose 6x daily ?  ?Accu-Chek Guide w/Device Kit ?1 kit by Does not apply route daily as needed. ?  ?Accu-Chek Softclix Lancets lancets ?Please use to check blood sugar up to 6 times/day. ?  ?albuterol 108 (90 Base) MCG/ACT inhaler ?Commonly known as: VENTOLIN HFA ?Inhale 2 puffs into the lungs every 6 (six) hours as needed for wheezing or shortness of breath. ?  ?BD Pen Needle Nano 2nd Gen 32G X 4 MM Misc ?Generic drug: Insulin Pen Needle ?USE TO INJECT INSULIN 6 TIMES DAILY ?  ?Dexcom G6 Transmitter Misc ?See admin instructions. ?  ?FreeStyle Libre 2 Sensor Misc ?Change sensor every 14 days. ?What changed:  ?how much to take ?how to take this ?when to take this ?additional instructions ?  ?glucagon 1 MG injection ?Inject 1 mg IM for severe hypoglycemia ?What changed:  ?how much to take ?how to take this ?when to take this ?reasons to take this ?additional instructions ?  ?insulin lispro 100 UNIT/ML KwikPen ?Commonly known as: HumaLOG KwikPen ?Inject 5 Units into the skin with breakfast, with lunch, and with evening meal. ?What changed: how much to take ?  ?INSULIN SYRINGE 1CC/30GX1/2" 30G X 1/2" 1 ML Misc ?Use to draw up Lantus daily. ?  ?INSULIN SYRINGE .5CC/30GX1/2" 30G X 1/2" 0.5 ML Misc ?Use to draw up Novolog three times daily. ?  ?Lantus SoloStar 100 UNIT/ML Solostar Pen ?Generic drug: insulin glargine ?Inject 30 Units into the skin at bedtime. ?What changed: how much to take ?  ?sertraline 50 MG tablet ?Commonly known as: ZOLOFT ?Take 1.5 tablets (75 mg total) by mouth daily. ?What changed:  ?when to take this ?additional instructions ?  ?SUMAtriptan 25 MG tablet ?Commonly known as: Imitrex ?Take 0.5 tablets (12.5 mg total) by mouth every 2 (two) hours  as needed for migraine. May repeat in 2 hours if headache persists or recurs. ?What changed:  ?reasons to take this ?additional instructions ?  ? ?  ? ? ?Discharge Instructions: Please refer to Patient Instructions section of EMR for full details.  Patient was counseled important signs and symptoms that should prompt return to medical care, changes in medications, dietary instructions, activity restrictions, and follow up appointments.  ? ?Follow-Up Appointments: ? Follow-up Information   ? ?  Gifford Shave, MD. Daphane Shepherd on 03/16/2021.   ?Specialty: Family Medicine ?Why: Please arrive to the clinic at 2:55pm for your appointment at 3:10pm. ?Contact information: ?[address] [place] [address] ?[phone] ? ? ?  ?  ? ? Gerome Apley, MD. Go on 04/07/2021.   ?Specialty: Endocrinology ?Contact information: ?[address] ?[address] ?[place] [address] ?[phone] ? ? ?  ?  ? ?  ?  ? ?  ? ? ?Orvis Brill, DO ?03/11/2021, 6:35 PM ?PGY-1, [place]  ?

## 2021-03-11 NOTE — Hospital Course (Addendum)
Dawn [name] is a 21 y.o.female with a history of T1DM, asthma, MDD, migraines who was admitted to the [facility] Teaching Service at [facility] for DKA with episode of hypoglycemia. Her hospital course is detailed below: ? ?DKA w/ hypoglycemia ?Patient admitted for elevated glucose prior to arrival in the ED and had taken 80 units of Lantus and 20 units of Humalog that day without eating.  Upon arrival, CBG 175 eventually dropped to 27.  VBG with pH 7.28 and BMP notable for CO2 10, AG 17.  BHB 3.72.  Patient received 2 L of LR boluses.  Her CBGs were monitored closely and given treatment as necessary for hypoglycemic ranges.  BMP monitored every 4 hours and BHB monitoring every 8 hours.  Upon discharge, patient had closed anion gap with normal electrolytes. Pt was discharged with instructions to apply Dexcom when she gets home and to use 30 units Lantus daily and 5 units insulin at mealtime. She will need excessive diabetes education. ? ??Renal cyst ?Upon arrival, patient was concerned of nausea/vomiting/diarrhea that is existent a couple days prior in addition to questionable abdominal pain.  Beta-hCG quant negative.  D-dimer elevated to 0.94 but CT PE negative.  UA with moderate leuks, WBC 11-20 but few bacteria.  Patient received CTX x1 g in the ED but given no UTI-like sxs, monitored during hospitalization without further treatment. CT abdomen pelvis showed a small right renal cyst and normal appendix.  Renal ultrasound normal but recommending MRI for further characterization of questionable renal cyst. ?  ?Other chronic conditions were medically managed with home medications and formulary alternatives as necessary (asthma, MDD, migraines) ? ?PCP Follow-up Recommendations: ?MR for potential renal cyst ?Ensure adequate glucose control and that she is checking glucose levels. She should have endocrinology follow up on 4/5. ?

## 2021-03-11 NOTE — Progress Notes (Signed)
FPTS Interim Night Progress Note ? ?S:Patient sleeping comfortably.  Rounded with primary night RN.  No concerns voiced.  No orders required.   ? ?O: ?Today's Vitals  ? 03/10/21 1115 03/10/21 1347 03/10/21 1703 03/10/21 2013  ?BP: 117/85 109/83  118/82  ?Pulse: (!) 109 98  99  ?Resp: 16 17  17   ?Temp:  97.7 ?F (36.5 ?C)  98.2 ?F (36.8 ?C)  ?TempSrc:  Oral  Oral  ?SpO2: 100% 100%  99%  ?Weight:      ?Height:      ?PainSc:   0-No pain   ? ? ? ? ?A/P: ?Continue current management ? ? MD ?PGY-3, [facility] ?Service pager [phone]   ?

## 2021-03-11 NOTE — Progress Notes (Addendum)
Inpatient Diabetes Program Recommendations ? ?AACE/ADA: New Consensus Statement on Inpatient Glycemic Control (2015) ? ?Target Ranges:  Prepandial:   less than 140 mg/dL ?     Peak postprandial:   less than 180 mg/dL (1-2 hours) ?     Critically ill patients:  140 - 180 mg/dL  ? ?Lab Results  ?Component Value Date  ? GLUCAP 292 (H) 03/11/2021  ? HGBA1C 14.0 (H) 02/07/2021  ? ? ?Review of Glycemic Control ? Latest Reference Range & Units 03/10/21 08:03 03/10/21 10:10 03/10/21 11:43 03/10/21 16:58 03/10/21 20:12 03/11/21 00:01 03/11/21 07:54  ?Glucose-Capillary 70 - 99 mg/dL 676 (H) 720 (H) 947 (H) 375 (H) 281 (H) 233 (H) 292 (H)  ?Diabetes history: DM1 ?Outpatient Diabetes medications: Lantus 30 units q hs, Humalog 10 units tid meal coverage, add 1 unit correction for every 50 above 150 ?Current orders for Inpatient glycemic control: ?Novolog sensitive tid with meals ?Semglee 20 units q HS ? ?Inpatient Diabetes Program Recommendations:   ? ?Please consider adding Novolog meal coverage 4 units tid with meals (hold if patient eats less than 50% or NPO). ?Also please increase Semglee to 25 units q HS.  ?Patient will need to remain on subcutaneous insulin until receives training on new insulin pump at Dr. Lacie Draft office.   ? ?Thanks,  ?Beryl Meager, RN, BC-ADM ?Inpatient Diabetes Coordinator ?Pager [phone]  (8a-5p) ? ?Addendum:  Spoke with patient regarding plans.  She has a T-Slim insulin pump which she said was ordered by Cristy Folks, CDE?  She states that she was wearing her Dexcom prior to admit and also the [employer] 3 (which was a sample).  She has appointment with Dr. Shawnee Knapp on April 3.  Sent message to Cristy Folks regarding patient being in hospital and need for follow-up.  ?I spoke with patient briefly regarding the benefits of insulin pump and sensor- closed loop system.  Patient seemed surprised stating-"I didn't know it would do that".  Reiterated the importance of taking her prescribed insulin  as ordered and close f/u with MD and educator.  Patient appreciative of visit.  She was reordering her lunch b/c they sent a "chicken Patty" instead of "chicken tenders".   ? ? ? ?

## 2021-03-11 NOTE — Plan of Care (Signed)

## 2021-03-12 ENCOUNTER — Telehealth: Payer: Self-pay | Admitting: Dietician

## 2021-03-12 ENCOUNTER — Telehealth: Payer: Self-pay

## 2021-03-12 NOTE — Telephone Encounter (Signed)
Returned patient call. ?Patient needs training on the t:slim insulin pump.   ?Discussed that the trainer will be back the week of 03/22/2021 and will call to arrange the appointment. ? ?Oran Rein, RD, LDN, CDCES ? ?

## 2021-03-12 NOTE — Telephone Encounter (Signed)
Patient calls nurse line requesting to schedule follow up appointment with PCP. Advised patient that she has hospital follow up visit scheduled with Dr. Nobie Putnam on 3/14. Patient states that she will keep this appointment but would also like to schedule with Dr. Clent Ridges for her next available appointment.  ? ?Scheduled with PCP on 3/20, per patient request.  ? ?Veronda Prude, RN ? ?

## 2021-03-16 ENCOUNTER — Other Ambulatory Visit (HOSPITAL_COMMUNITY): Payer: Self-pay

## 2021-03-16 ENCOUNTER — Encounter: Payer: Self-pay | Admitting: Family Medicine

## 2021-03-16 ENCOUNTER — Other Ambulatory Visit: Payer: Self-pay

## 2021-03-16 ENCOUNTER — Ambulatory Visit (INDEPENDENT_AMBULATORY_CARE_PROVIDER_SITE_OTHER): Payer: Medicaid Other | Admitting: Family Medicine

## 2021-03-16 VITALS — BP 100/65 | HR 99 | Ht 61.0 in | Wt 115.4 lb

## 2021-03-16 DIAGNOSIS — E1065 Type 1 diabetes mellitus with hyperglycemia: Secondary | ICD-10-CM | POA: Diagnosis not present

## 2021-03-16 DIAGNOSIS — N2889 Other specified disorders of kidney and ureter: Secondary | ICD-10-CM

## 2021-03-16 NOTE — Patient Instructions (Addendum)
It was great seeing you today!  I am glad your sugars have been doing better but I am concerned about the low blood sugars.  I want you to max out your mealtime insulin to 6 units for your nighttime meals and see what your blood sugars are doing after that.  If they are getting too high you can go back to your previous doses.  Please be sure to follow-up with Dr. Clent Ridges on the 20th of this month as well as your diabetes educator.  You also need to follow-up with your endocrinologist in April.  I want to keep your Lantus where it is.  We are scheduling an MRI to evaluate the cyst on your kidney.  If you have any questions or concerns please call our clinic.  Hope you have a wonderful afternoon! ? ? ?

## 2021-03-16 NOTE — Progress Notes (Signed)
? ? ?SUBJECTIVE:  ? ?CHIEF COMPLAINT / HPI:  ? ?Hospital follow-up ?Diabetes ?Patient reports that since being hospitalized she has had difficulty controlling her blood sugars.  She has been in touch with endocrinology and they recommended continuing on the 30 units of Lantus with sliding scale insulin of 5-10 units with meals.  Recommended if she is eating out to take her sliding scale prior to the meal and approximate the size of her meal and if she is eating at home she can take the sliding scale after her meals.  She reports that her blood sugars have ranged from 55-3 100s.  She has had multiple hypoglycemic episodes in the early morning hours which she wakes up and is feeling the symptoms of hypoglycemia and has to eat something.  She is concerned regarding these hypoglycemic episodes.  She is planning on getting a insulin pump but needs education on how to use this so is meeting with someone next week to do this.  She is also following up with her PCP next week. ? ?Renal cysts ?Patient has concerns regarding the renal cyst that was seen during her hospitalization.  Has questions regarding when she is going to get further imaging.  Does report right-sided abdominal pain which she feels is related to where the cyst is. ? ? ?PERTINENT  PMH / PSH: Type 1 diabetes, renal cyst ? ? ?OBJECTIVE:  ? ?BP 100/65   Pulse 99   Ht 5\' 1"  (1.549 m)   Wt 115 lb 6.4 oz (52.3 kg)   LMP 01/27/2021   SpO2 99%   BMI 21.80 kg/m?   ?General: Pleasant 21 year old female no acute distress ?HEENT: Moist mucous membranes, EOM intact ?Cardiac: Regular rate and rhythm, no murmurs appreciated ?Respiratory: Normal work of breathing, lungs clear to auscultation ?Abdomen: Soft, nontender, positive bowel sounds, patient is able to localize where pain occurs when it does occur but it is not happening right now and is on the right flank ?MSK: No gross abnormalities ? ? ?ASSESSMENT/PLAN:  ? ?Uncontrolled type 1 diabetes mellitus with  hyperglycemia (HCC) ?Patient's blood pressures poorly controlled since discharge from the hospital.  Reports that her blood pressures refrains from the lowest of 55-300.  Currently taking 30 units of Lantus daily as well as 5-10 units mealtime coverage.  Her low blood sugars are occurring mainly in the middle of the night around 3 AM.  Discussed continuing Lantus at current dose until follow-up with endocrinology.  Regarding her short acting insulin I would like for the patient to decrease her sliding scale at nighttime to no more than 6 units and evaluate her sugars.  If they begin to rise overnight she can go back to the 5-10 units nightly.  She has close follow-up with the diabetic coordinator next week and then endocrinology in early April.  She is following up with her PCP in 1 week.  She is going to bring her blood sugars at that time and may need adjustments then. ?- Continue 30 units Lantus nightly ?- Mealtime coverage 5-10 units for breakfast and lunch and 5-6 units for dinner which may be adjusted if blood sugars remain high ?- Follow-up with endocrine next month ?- Follow-up with diabetes coordinator regarding insulin pump ?- Patient understands hyperglycemia and hypoglycemia symptoms will be evaluated sooner if needed ? ?Other specified disorders of kidney and ureter ?Patient was found to have cyst on her kidney and the hospital.  It was recommended for renal ultrasound which further recommended MRI to  better characterize cyst.  MRI has been ordered.  Follow-up as needed. ?  ? ? ?Derrel Nip, MD ?[facility]  ? ?

## 2021-03-17 DIAGNOSIS — N2889 Other specified disorders of kidney and ureter: Secondary | ICD-10-CM | POA: Insufficient documentation

## 2021-03-17 NOTE — Assessment & Plan Note (Signed)
Patient's blood pressures poorly controlled since discharge from the hospital.  Reports that her blood pressures refrains from the lowest of 55-300.  Currently taking 30 units of Lantus daily as well as 5-10 units mealtime coverage.  Her low blood sugars are occurring mainly in the middle of the night around 3 AM.  Discussed continuing Lantus at current dose until follow-up with endocrinology.  Regarding her short acting insulin I would like for the patient to decrease her sliding scale at nighttime to no more than 6 units and evaluate her sugars.  If they begin to rise overnight she can go back to the 5-10 units nightly.  She has close follow-up with the diabetic coordinator next week and then endocrinology in early April.  She is following up with her PCP in 1 week.  She is going to bring her blood sugars at that time and may need adjustments then. ?- Continue 30 units Lantus nightly ?- Mealtime coverage 5-10 units for breakfast and lunch and 5-6 units for dinner which may be adjusted if blood sugars remain high ?- Follow-up with endocrine next month ?- Follow-up with diabetes coordinator regarding insulin pump ?- Patient understands hyperglycemia and hypoglycemia symptoms will be evaluated sooner if needed ?

## 2021-03-17 NOTE — Assessment & Plan Note (Signed)
Patient was found to have cyst on her kidney and the hospital.  It was recommended for renal ultrasound which further recommended MRI to better characterize cyst.  MRI has been ordered.  Follow-up as needed. ? ?

## 2021-03-18 ENCOUNTER — Telehealth: Payer: Self-pay | Admitting: Dietician

## 2021-03-18 NOTE — Telephone Encounter (Signed)
Patient called again re:  t:slim pump training. ?Discussed that the trainer will return 03/22/2021 and call her for an appointment sometime that week. ? ?Oran Rein, RD, LDN, CDCES ? ?

## 2021-03-21 ENCOUNTER — Other Ambulatory Visit: Payer: Self-pay | Admitting: Student

## 2021-03-21 ENCOUNTER — Other Ambulatory Visit: Payer: Self-pay | Admitting: Family Medicine

## 2021-03-21 DIAGNOSIS — Z8669 Personal history of other diseases of the nervous system and sense organs: Secondary | ICD-10-CM

## 2021-03-22 ENCOUNTER — Other Ambulatory Visit: Payer: Self-pay | Admitting: Family Medicine

## 2021-03-22 ENCOUNTER — Ambulatory Visit: Payer: Medicaid Other | Admitting: Family Medicine

## 2021-03-22 MED ORDER — SUMATRIPTAN SUCCINATE 25 MG PO TABS: 12.5000 mg | ORAL_TABLET | ORAL | 0 refills | Status: DC | PRN

## 2021-03-22 NOTE — Progress Notes (Deleted)
No Show

## 2021-03-23 NOTE — Telephone Encounter (Signed)
Attempted to reach patient.  No answer. LVM of order from Doctor. Aquilla Solian, CMA ? ?

## 2021-03-24 ENCOUNTER — Other Ambulatory Visit: Payer: Self-pay

## 2021-03-24 DIAGNOSIS — E1065 Type 1 diabetes mellitus with hyperglycemia: Secondary | ICD-10-CM

## 2021-03-24 MED ORDER — ALBUTEROL SULFATE HFA 108 (90 BASE) MCG/ACT IN AERS: 2.0000 | INHALATION_SPRAY | Freq: Four times a day (QID) | RESPIRATORY_TRACT | 0 refills | Status: DC | PRN

## 2021-03-24 MED ORDER — BD PEN NEEDLE NANO 2ND GEN 32G X 4 MM MISC: 5 refills | Status: DC

## 2021-03-25 ENCOUNTER — Other Ambulatory Visit: Payer: Medicaid Other

## 2021-03-31 ENCOUNTER — Ambulatory Visit
Admission: RE | Admit: 2021-03-31 | Discharge: 2021-03-31 | Disposition: A | Discharge status: Home / Self Care | Payer: Medicaid Other | Source: Ambulatory Visit | Attending: Family Medicine | Admitting: Family Medicine

## 2021-03-31 DIAGNOSIS — N2889 Other specified disorders of kidney and ureter: Secondary | ICD-10-CM

## 2021-03-31 IMAGING — MR MR ABDOMEN W/O CM
8 of 9 series · 40 of 48 positions shown · non-contrast
Comparison: CT abdomen and pelvis [DATE]

CLINICAL DATA: Right renal lesion follow-up

EXAM:
MRI ABDOMEN WITHOUT CONTRAST
TECHNIQUE: Multiplanar multisequence MR imaging was performed without the
administration of intravenous contrast.

[Series 8: T2 · coronal · 5.0mm · 1.41mm/px · 1 of 20 slices shown (1 of 3)]
[im 1/20]
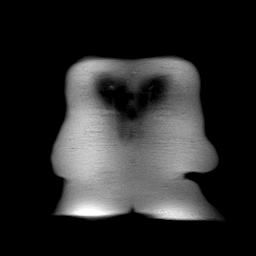

[Series 9: axial tru fisp · axial · 5.0mm · 1.48mm/px · z∈[-49,+118]mm · 4 of 30 slices shown]
[im 1/30]
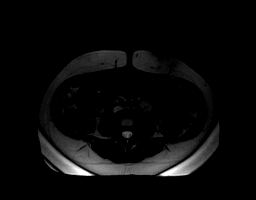
[im 10/30]
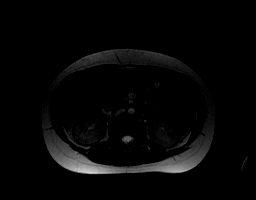
[im 20/30]
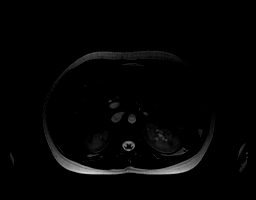
[im 30/30]
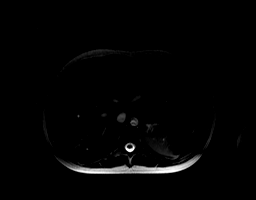

[Series 10: ep2d_[id number]_500_800_p2 · axial · 5.0mm · 1.98mm/px · z∈[-56,+111]mm · 11 of 90 slices shown]
[im 1/90]
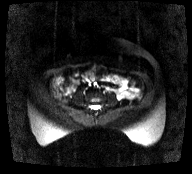
[im 9/90]
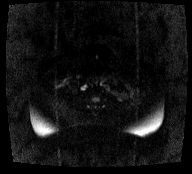
[im 18/90]
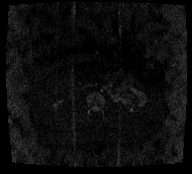
[im 27/90]
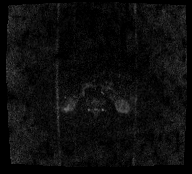
[im 36/90]
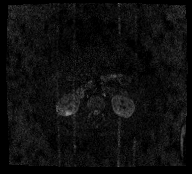
[im 45/90]
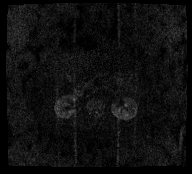
[im 54/90]
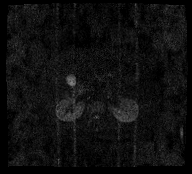
[im 63/90]
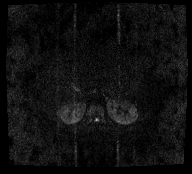
[im 72/90]
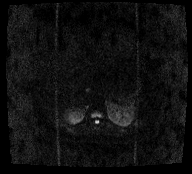
[im 81/90]
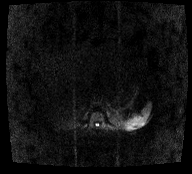
[im 90/90]
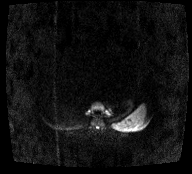

[Series 11: [id number]_adc · axial · 5.0mm · 1.98mm/px · z∈[-56,+111]mm · 4 of 30 slices shown]
[im 1/30]
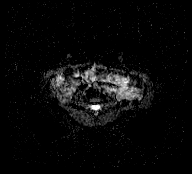
[im 10/30]
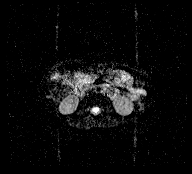
[im 20/30]
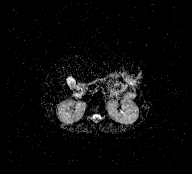
[im 30/30]
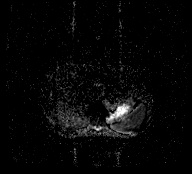

[Series 12: T2 · axial · 5.0mm · 0.74mm/px · z∈[-56,+111]mm · 4 of 30 slices shown (2 of 3)]
[im 1/30]
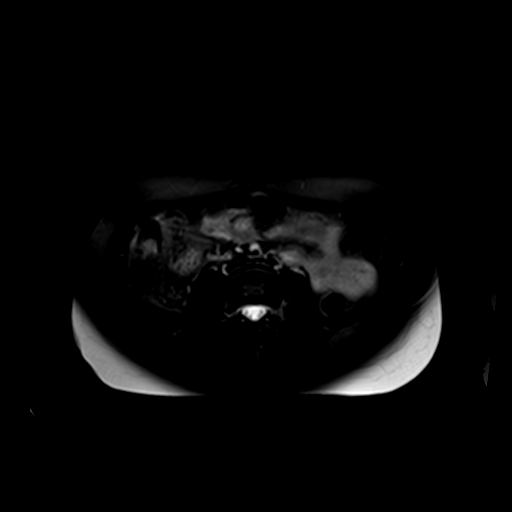
[im 10/30]
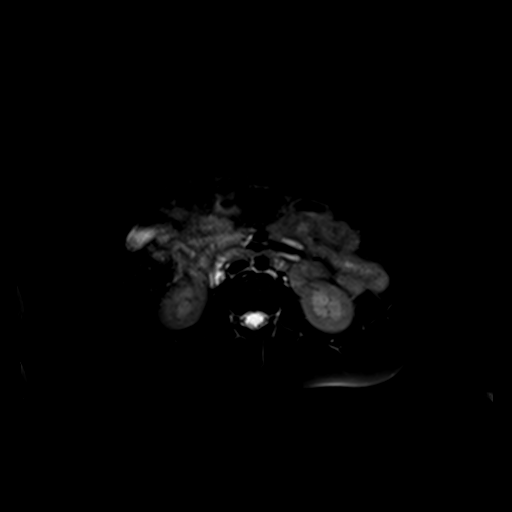
[im 20/30]
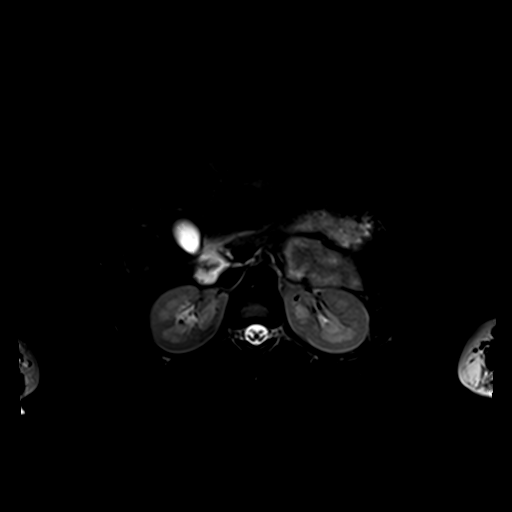
[im 30/30]
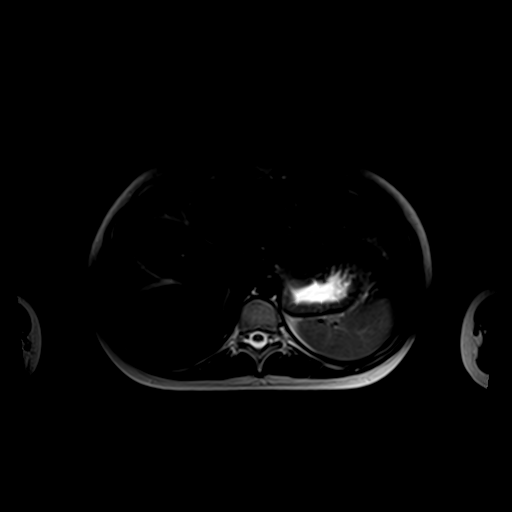

[Series 13: T2 · axial · 5.0mm · 1.41mm/px · z∈[-53,+114]mm · 4 of 30 slices shown (3 of 3)]
[im 1/30]
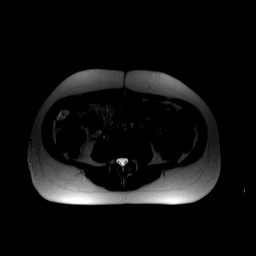
[im 10/30]
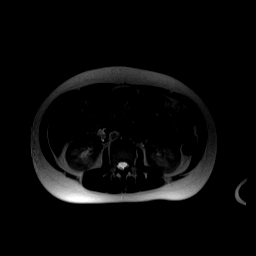
[im 20/30]
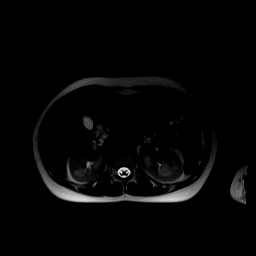
[im 30/30]
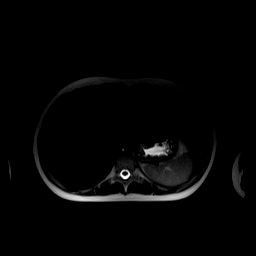

[Series 14: axial in out · axial · 5.5mm · 0.74mm/px · z∈[-48,+119]mm · 7 of 60 slices shown]
[im 1/60]
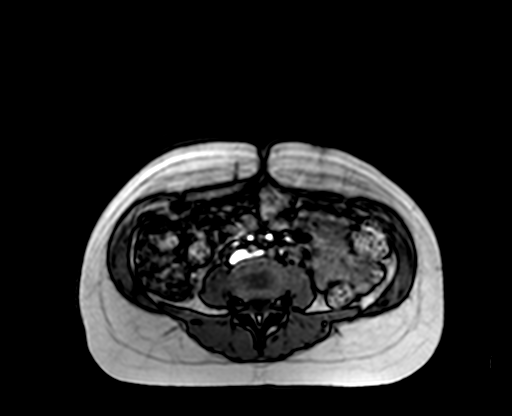
[im 10/60]
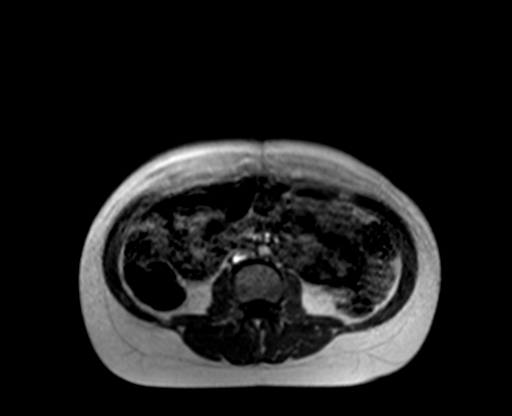
[im 20/60]
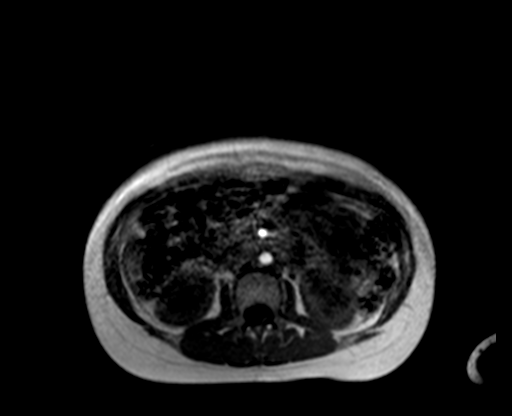
[im 30/60]
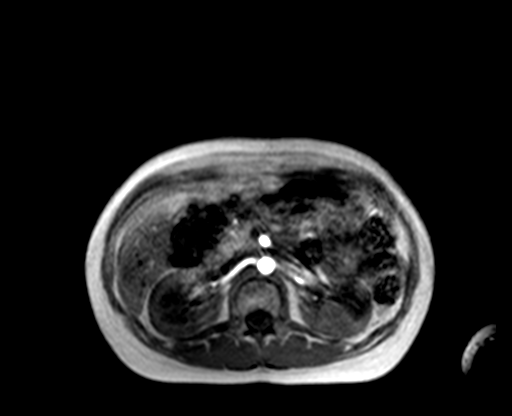
[im 40/60]
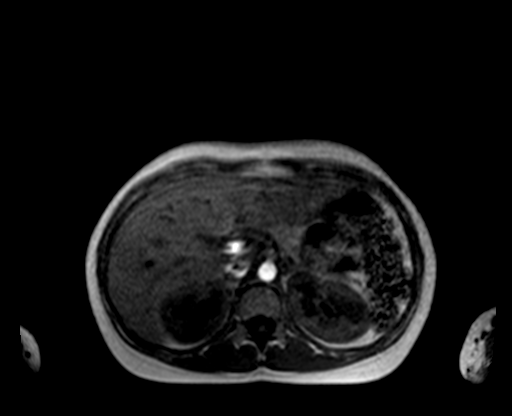
[im 50/60]
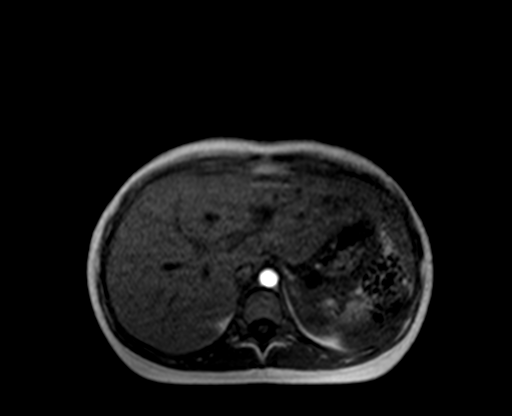
[im 60/60]
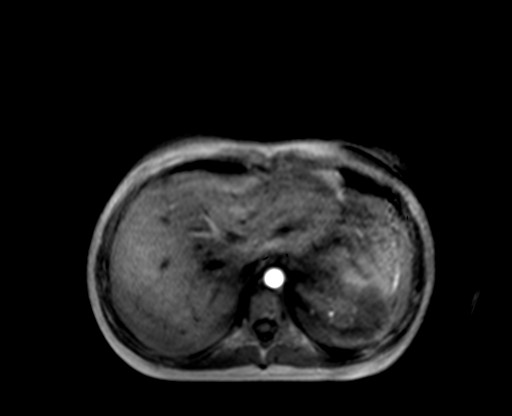

[Series 15: T1 dynamic · axial · non-contrast · 2.0mm · 0.74mm/px · z∈[-43,+42]mm · 5 of 80 slices shown]
[im 1/80]
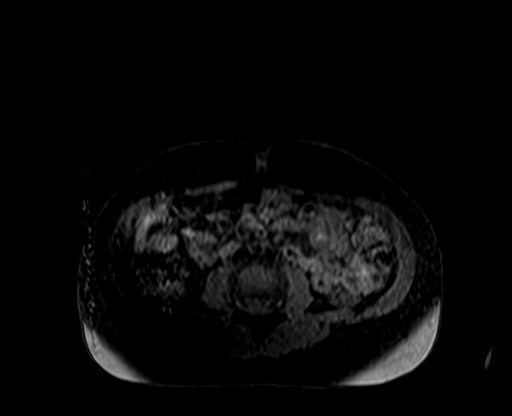
[im 9/80]
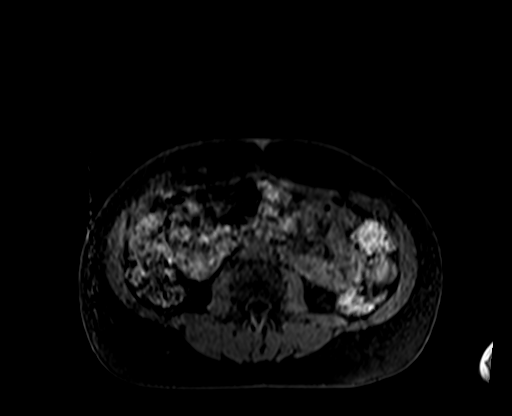
[im 27/80]
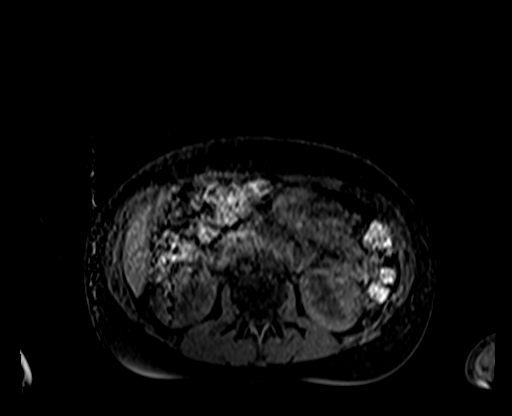
[im 36/80]
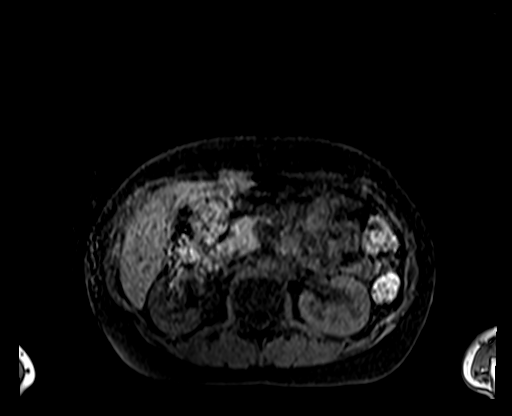
[im 44/80]
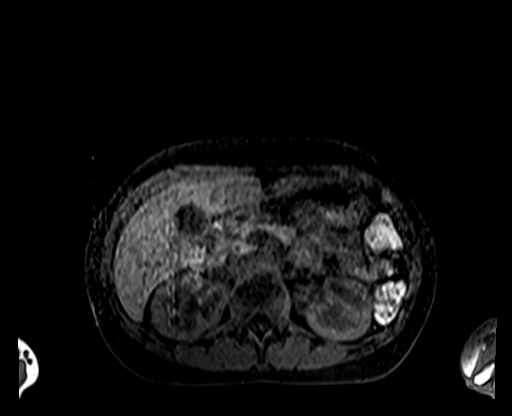

[40 of 48 positions shown; findings below may reference images not displayed]

FINDINGS: Lower chest: Not well visualized.

Hepatobiliary: No mass or other parenchymal abnormality identified.

Pancreas: No mass, inflammatory changes, or other parenchymal
abnormality identified.

Spleen:  Within normal limits in size and appearance.

Adrenals/Urinary Tract: Adrenal glands appear normal. Mild somewhat
wedge-shaped area of increased T2 signal and mild diffusion
restriction at the posterior lower right kidney, region of
abnormality on CT. No defined mass or cyst. No hydronephrosis.
Somewhat limited evaluation without contrast.

Stomach/Bowel: Visualized portions within the abdomen are
unremarkable.

Vascular/Lymphatic: No pathologically enlarged lymph nodes
identified. No abdominal aortic aneurysm demonstrated.

Other:  None.

Musculoskeletal: No suspicious bone lesions identified.
IMPRESSION: Signal abnormality in the posterior lower right kidney as described
is suggestive of acute pyelonephritis, correlate clinically.

These results will be called to the ordering clinician or
representative by the Radiologist Assistant, and communication
documented in the PACS or [REDACTED].

## 2021-04-01 ENCOUNTER — Telehealth: Payer: Self-pay | Admitting: Family Medicine

## 2021-04-01 NOTE — Telephone Encounter (Signed)
Called patient to discuss MRI results.  She reports that she is not having any burning with urination but is having right-sided abdominal pain.  She is on her menses and associated that with the menses.  Discussed having a order for a urinalysis and urine culture but patient reported she would rather be seen.  She is thinking about going to the emergency department.  Scheduled her for an access to care appointment at 10:50 AM on 3/31.  Provided patient strict ED precautions.  Recommended at that visit she you have urinalysis, urine culture, BMP to assess renal function. ?

## 2021-04-02 ENCOUNTER — Ambulatory Visit: Payer: Medicaid Other

## 2021-04-05 ENCOUNTER — Ambulatory Visit (INDEPENDENT_AMBULATORY_CARE_PROVIDER_SITE_OTHER): Payer: Medicaid Other | Admitting: Family Medicine

## 2021-04-05 ENCOUNTER — Other Ambulatory Visit (HOSPITAL_COMMUNITY)
Admission: RE | Admit: 2021-04-05 | Discharge: 2021-04-05 | Disposition: A | Discharge status: Home / Self Care | Payer: Medicaid Other | Source: Ambulatory Visit | Attending: Family Medicine | Admitting: Family Medicine

## 2021-04-05 ENCOUNTER — Encounter: Payer: Self-pay | Admitting: Family Medicine

## 2021-04-05 VITALS — BP 120/87 | HR 106 | Ht 62.0 in | Wt 110.6 lb

## 2021-04-05 DIAGNOSIS — N10 Acute tubulo-interstitial nephritis: Secondary | ICD-10-CM | POA: Insufficient documentation

## 2021-04-05 LAB — POCT URINALYSIS DIP (CLINITEK)
Bilirubin, UA: NEGATIVE
Blood, UA: NEGATIVE
Glucose, UA: >=1000 mg/dL — AB
Ketones, POC UA: NEGATIVE mg/dL
Leukocytes, UA: NEGATIVE
Nitrite, UA: NEGATIVE
POC PROTEIN,UA: NEGATIVE
Spec Grav, UA: 1.015
Urobilinogen, UA: 0.2 U/dL
pH, UA: 6.5

## 2021-04-05 LAB — POCT UA - MICROSCOPIC ONLY
Bacteria, U Microscopic: NONE SEEN
WBC, Ur, HPF, POC: NONE SEEN (ref 0–5)

## 2021-04-05 LAB — POCT URINE PREGNANCY: Preg Test, Ur: NEGATIVE

## 2021-04-05 MED ORDER — OXYCODONE HCL 5 MG PO TABS: 5.0000 mg | ORAL_TABLET | Freq: Four times a day (QID) | ORAL | 0 refills | Status: AC | PRN

## 2021-04-05 MED ORDER — CIPROFLOXACIN HCL 500 MG PO TABS: 500.0000 mg | ORAL_TABLET | Freq: Two times a day (BID) | ORAL | 0 refills | Status: DC

## 2021-04-05 NOTE — Progress Notes (Signed)
? ? ?  SUBJECTIVE:  ? ?CHIEF COMPLAINT / HPI: follow up MRI results ? ?Recent results MRI showing acute pyelonephritis.  Patient reports lower back pain and right lower abdominal pain that has not gotten any better.  Denies any fevers, nausea, vomiting, urinary symptoms, vaginal discharge, diarrhea or constipation.   ? ?PERTINENT  PMH / PSH:  ?DM Type 1 uncontrolled ? ?OBJECTIVE:  ? ?BP 120/87   Pulse (!) 106   Ht 5\' 2"  (1.575 m)   Wt 110 lb 9.6 oz (50.2 kg)   LMP 03/28/2021 (Approximate)   SpO2 100%   BMI 20.23 kg/m?   ? ?General: Alert, no acute distress ?Cardio: Normal S1 and S2, RRR, no r/m/g ?Pulm: CTAB, normal work of breathing ?Abdomen: Bowel sounds normal. Abdomen soft and mild tenderness RLQ. Negative rebound sign. No peritoneal signs. Right CVA tenderness.  ? ?ASSESSMENT/PLAN:  ? ?Acute pyelonephritis ?Recent MRI showing abnormality in right lower kidney suggestive of acute pyelonephritis.  Given right CVA tenderness and right lower quadrant pain will treat with antibiotics. ?-Urinalysis and urine culture ?-UPT negative ?-Urine GC&C ?-Ciprofloxacin 500 mg BID x 10 days ?-Oxycodone IR 5 mg q6h prn x 12 tabs ?-CBC/diff and Cmet today ?-Strict return precautions provided ?-Follow up once completed treatment or sooner if symptoms worsen ?  ? ? ?03/30/2021, MD ?[facility]  ?

## 2021-04-05 NOTE — Patient Instructions (Signed)
Thank you for coming to see me today. It was a pleasure.  ? ?Acute kidney infection. ?Start ciprofloxacin 500 mg 1 tablet twice a day for 10 days. ?Take oxycodone 5 mg every 6 hours as needed for pain.  You can use Tylenol 500 mg every 4 hours in between. ? ?Drink plenty of fluids ? ?We will get some labs today.  If they are abnormal or we need to do something about them, I will call you.  If they are normal, I will send you a message on MyChart (if it is active) or a letter in the mail.  If you don't hear from [place] in 2 weeks, please call the office at the number below.  ? ?If you develop any fevers, worsening back pain or belly pain, difficulty peeing, nausea or vomiting and cannot keep anything down please go to the emergency department. ? ?Please follow-up with PCP in 1 week ? ?If you have any questions or concerns, please do not hesitate to call the office at [phone]. ? ?Best,  ? ?Dana Allan, MD   ?

## 2021-04-06 LAB — CBC WITH DIFFERENTIAL/PLATELET
Basophils Absolute: 0.1 10*3/uL (ref 0.0–0.2)
Basos: 1 %
EOS (ABSOLUTE): 0.1 10*3/uL (ref 0.0–0.4)
Eos: 2 %
Hematocrit: 35.2 % (ref 34.0–46.6)
Hemoglobin: 11.5 g/dL (ref 11.1–15.9)
Immature Grans (Abs): 0 10*3/uL (ref 0.0–0.1)
Immature Granulocytes: 0 %
Lymphocytes Absolute: 2.9 10*3/uL (ref 0.7–3.1)
Lymphs: 41 %
MCH: 28.7 pg (ref 26.6–33.0)
MCHC: 32.7 g/dL (ref 31.5–35.7)
MCV: 88 fL (ref 79–97)
Monocytes Absolute: 0.4 10*3/uL (ref 0.1–0.9)
Monocytes: 6 %
Neutrophils Absolute: 3.6 10*3/uL (ref 1.4–7.0)
Neutrophils: 50 %
Platelets: 412 10*3/uL (ref 150–450)
RBC: 4.01 x10E6/uL (ref 3.77–5.28)
RDW: 13 % (ref 11.7–15.4)
WBC: 7.2 10*3/uL (ref 3.4–10.8)

## 2021-04-06 LAB — COMPREHENSIVE METABOLIC PANEL
ALT: 8 IU/L (ref 0–32)
AST: 8 IU/L (ref 0–40)
Albumin/Globulin Ratio: 1.1 — ABNORMAL LOW (ref 1.2–2.2)
Albumin: 3.7 g/dL — ABNORMAL LOW (ref 3.9–5.0)
Alkaline Phosphatase: 141 IU/L — ABNORMAL HIGH (ref 42–106)
BUN/Creatinine Ratio: 27 — ABNORMAL HIGH (ref 9–23)
BUN: 16 mg/dL (ref 6–20)
Bilirubin Total: <0.2 mg/dL (ref 0.0–1.2)
CO2: 22 mmol/L (ref 20–29)
Calcium: 9.7 mg/dL (ref 8.7–10.2)
Chloride: 99 mmol/L (ref 96–106)
Creatinine, Ser: 0.6 mg/dL (ref 0.57–1.00)
Globulin, Total: 3.5 g/dL (ref 1.5–4.5)
Glucose: 442 mg/dL — ABNORMAL HIGH (ref 70–99)
Potassium: 4.7 mmol/L (ref 3.5–5.2)
Sodium: 136 mmol/L (ref 134–144)
Total Protein: 7.2 g/dL (ref 6.0–8.5)
eGFR: 133 mL/min/{1.73_m2} (ref >59)

## 2021-04-06 LAB — URINE CYTOLOGY ANCILLARY ONLY
Chlamydia: POSITIVE — AB
Comment: NEGATIVE
Comment: NORMAL
Neisseria Gonorrhea: POSITIVE — AB

## 2021-04-06 LAB — HEPATITIS B SURFACE ANTIBODY,QUALITATIVE: Hep B Surface Ab, Qual: NONREACTIVE

## 2021-04-06 LAB — HEPATITIS B SURFACE ANTIGEN: Hepatitis B Surface Ag: NEGATIVE

## 2021-04-07 ENCOUNTER — Encounter: Payer: Self-pay | Admitting: Family Medicine

## 2021-04-07 ENCOUNTER — Telehealth: Payer: Self-pay | Admitting: Family Medicine

## 2021-04-07 ENCOUNTER — Ambulatory Visit: Payer: Medicaid Other

## 2021-04-07 DIAGNOSIS — N10 Acute tubulo-interstitial nephritis: Secondary | ICD-10-CM | POA: Insufficient documentation

## 2021-04-07 LAB — URINE CULTURE

## 2021-04-07 MED ORDER — DOXYCYCLINE HYCLATE 100 MG PO TABS: 100.0000 mg | ORAL_TABLET | Freq: Two times a day (BID) | ORAL | 0 refills | Status: DC

## 2021-04-07 NOTE — Telephone Encounter (Signed)
Called patient to discuss positive GC&C results.  ?Recently treated with Cipro for acute pyelonephritis.  U/A negative. Urine culture pending.  Will stop Cipro now. ?Start Doxycycline 100 mg twice daily x 7 days ?Scheduled to come to RN clinic in am for treatment.  Please administer Ceftriaxone 500 mg IM x1.   ?If after completed treatment of Doxycycline continues to have symptoms of Pyelonephritis, patient aware to return to clinic for reevaluation. ? ?Dana Allan, MD ?Family Medicine Residency   ? ?

## 2021-04-07 NOTE — Assessment & Plan Note (Addendum)
Recent MRI showing abnormality in right lower kidney suggestive of acute pyelonephritis.  Given right CVA tenderness and right lower quadrant pain will treat with antibiotics. ?-Urinalysis and urine culture ?-UPT negative ?-Urine GC&C ?-Ciprofloxacin 500 mg BID x 10 days ?-Oxycodone IR 5 mg q6h prn x 12 tabs ?-CBC/diff and Cmet today ?-Strict return precautions provided ?-Follow up once completed treatment or sooner if symptoms worsen ?

## 2021-04-08 ENCOUNTER — Encounter: Payer: Self-pay | Admitting: Family Medicine

## 2021-04-08 ENCOUNTER — Ambulatory Visit (INDEPENDENT_AMBULATORY_CARE_PROVIDER_SITE_OTHER): Payer: Medicaid Other

## 2021-04-08 DIAGNOSIS — A549 Gonococcal infection, unspecified: Secondary | ICD-10-CM

## 2021-04-08 MED ORDER — CEFTRIAXONE SODIUM 250 MG IJ SOLR
250.0000 mg | Freq: Once | INTRAMUSCULAR | Status: AC
  Administered 2021-04-08: 250 mg via INTRAMUSCULAR

## 2021-04-08 NOTE — Progress Notes (Signed)
Patient in nurse clinic today for STD treatment of Gonorrhea. ? ?Patient advised to abstain from sex for 7-10 days after treatment of self and partner.   ? ?Ceftriaxone 250 mg IM x 1 given in LUOQ and Ceftriaxone 250 mg IM x 1 given in RUOQ per Dr. Claris Che orders.  Patient observed 15 minutes in office.  No reaction noted.  ? ?Patient to follow up in 2-3 months for re-screening.   ? ?Provided condoms and advised to use with all sexual activity. Patient verbalized understanding.  ? ?STD report form fax completed and faxed to [facility] Department at [phone] (STD department).    ? ? ? ? ?

## 2021-04-10 ENCOUNTER — Other Ambulatory Visit: Payer: Self-pay

## 2021-04-10 ENCOUNTER — Observation Stay (HOSPITAL_COMMUNITY)
Admission: EM | Admit: 2021-04-10 | Discharge: 2021-04-11 | Disposition: A | Discharge status: Home / Self Care | Payer: Medicaid Other | Attending: Family Medicine | Admitting: Family Medicine

## 2021-04-10 ENCOUNTER — Observation Stay (HOSPITAL_COMMUNITY): Payer: Medicaid Other

## 2021-04-10 ENCOUNTER — Emergency Department: Payer: Self-pay

## 2021-04-10 ENCOUNTER — Encounter (HOSPITAL_COMMUNITY): Payer: Self-pay

## 2021-04-10 DIAGNOSIS — F1290 Cannabis use, unspecified, uncomplicated: Secondary | ICD-10-CM | POA: Insufficient documentation

## 2021-04-10 DIAGNOSIS — I472 Ventricular tachycardia, unspecified: Secondary | ICD-10-CM | POA: Insufficient documentation

## 2021-04-10 DIAGNOSIS — F329 Major depressive disorder, single episode, unspecified: Secondary | ICD-10-CM | POA: Insufficient documentation

## 2021-04-10 DIAGNOSIS — Z79899 Other long term (current) drug therapy: Secondary | ICD-10-CM | POA: Diagnosis not present

## 2021-04-10 DIAGNOSIS — R112 Nausea with vomiting, unspecified: Secondary | ICD-10-CM | POA: Diagnosis present

## 2021-04-10 DIAGNOSIS — Z20822 Contact with and (suspected) exposure to covid-19: Secondary | ICD-10-CM | POA: Insufficient documentation

## 2021-04-10 DIAGNOSIS — E1110 Type 2 diabetes mellitus with ketoacidosis without coma: Secondary | ICD-10-CM | POA: Diagnosis present

## 2021-04-10 DIAGNOSIS — E1010 Type 1 diabetes mellitus with ketoacidosis without coma: Secondary | ICD-10-CM | POA: Diagnosis not present

## 2021-04-10 DIAGNOSIS — Z91040 Latex allergy status: Secondary | ICD-10-CM | POA: Insufficient documentation

## 2021-04-10 DIAGNOSIS — J45909 Unspecified asthma, uncomplicated: Secondary | ICD-10-CM | POA: Diagnosis not present

## 2021-04-10 DIAGNOSIS — Z794 Long term (current) use of insulin: Secondary | ICD-10-CM | POA: Diagnosis not present

## 2021-04-10 DIAGNOSIS — E0810 Diabetes mellitus due to underlying condition with ketoacidosis without coma: Secondary | ICD-10-CM

## 2021-04-10 DIAGNOSIS — R110 Nausea: Secondary | ICD-10-CM

## 2021-04-10 DIAGNOSIS — G9340 Encephalopathy, unspecified: Secondary | ICD-10-CM | POA: Insufficient documentation

## 2021-04-10 DIAGNOSIS — E1065 Type 1 diabetes mellitus with hyperglycemia: Secondary | ICD-10-CM

## 2021-04-10 DIAGNOSIS — A549 Gonococcal infection, unspecified: Secondary | ICD-10-CM | POA: Insufficient documentation

## 2021-04-10 LAB — BASIC METABOLIC PANEL
Anion gap: 15 (ref 5–15)
Anion gap: 11 (ref 5–15)
BUN: 28 mg/dL — ABNORMAL HIGH (ref 6–20)
BUN: 17 mg/dL (ref 6–20)
BUN: 12 mg/dL (ref 6–20)
CO2: <7 mmol/L — ABNORMAL LOW (ref 22–32)
CO2: 9 mmol/L — ABNORMAL LOW (ref 22–32)
CO2: 13 mmol/L — ABNORMAL LOW (ref 22–32)
Calcium: 10.3 mg/dL (ref 8.9–10.3)
Calcium: 8.6 mg/dL — ABNORMAL LOW (ref 8.9–10.3)
Calcium: 8.6 mg/dL — ABNORMAL LOW (ref 8.9–10.3)
Chloride: 100 mmol/L (ref 98–111)
Chloride: 115 mmol/L — ABNORMAL HIGH (ref 98–111)
Chloride: 115 mmol/L — ABNORMAL HIGH (ref 98–111)
Creatinine, Ser: 1.43 mg/dL — ABNORMAL HIGH (ref 0.44–1.00)
Creatinine, Ser: 0.95 mg/dL (ref 0.44–1.00)
Creatinine, Ser: 0.68 mg/dL (ref 0.44–1.00)
GFR, Estimated: 54 mL/min — ABNORMAL LOW (ref >60)
GFR, Estimated: >60 mL/min (ref >60)
GFR, Estimated: >60 mL/min (ref >60)
Glucose, Bld: 582 mg/dL (ref 70–99)
Glucose, Bld: 559 mg/dL (ref 70–99)
Glucose, Bld: 129 mg/dL — ABNORMAL HIGH (ref 70–99)
Potassium: 4.4 mmol/L (ref 3.5–5.1)
Potassium: 3.9 mmol/L (ref 3.5–5.1)
Potassium: 4.3 mmol/L (ref 3.5–5.1)
Sodium: 132 mmol/L — ABNORMAL LOW (ref 135–145)
Sodium: 139 mmol/L (ref 135–145)
Sodium: 139 mmol/L (ref 135–145)

## 2021-04-10 LAB — URINALYSIS, ROUTINE W REFLEX MICROSCOPIC
Bacteria, UA: NONE SEEN
Bilirubin Urine: NEGATIVE
Glucose, UA: >=500 mg/dL — AB
Hgb urine dipstick: NEGATIVE
Ketones, ur: 80 mg/dL — AB
Leukocytes,Ua: NEGATIVE
Nitrite: NEGATIVE
Protein, ur: 100 mg/dL — AB
Specific Gravity, Urine: 1.018 (ref 1.005–1.030)
pH: 5 (ref 5.0–8.0)

## 2021-04-10 LAB — GLUCOSE, CAPILLARY
Glucose-Capillary: 108 mg/dL — ABNORMAL HIGH (ref 70–99)
Glucose-Capillary: 117 mg/dL — ABNORMAL HIGH (ref 70–99)
Glucose-Capillary: 119 mg/dL — ABNORMAL HIGH (ref 70–99)
Glucose-Capillary: 119 mg/dL — ABNORMAL HIGH (ref 70–99)
Glucose-Capillary: 130 mg/dL — ABNORMAL HIGH (ref 70–99)
Glucose-Capillary: 145 mg/dL — ABNORMAL HIGH (ref 70–99)

## 2021-04-10 LAB — I-STAT CHEM 8, ED
BUN: 28 mg/dL — ABNORMAL HIGH (ref 6–20)
BUN: 16 mg/dL (ref 6–20)
Calcium, Ion: 1.23 mmol/L (ref 1.15–1.40)
Calcium, Ion: 1.26 mmol/L (ref 1.15–1.40)
Chloride: 111 mmol/L (ref 98–111)
Chloride: 116 mmol/L — ABNORMAL HIGH (ref 98–111)
Creatinine, Ser: 0.7 mg/dL (ref 0.44–1.00)
Creatinine, Ser: <0.2 mg/dL — ABNORMAL LOW (ref 0.44–1.00)
Glucose, Bld: 573 mg/dL (ref 70–99)
Glucose, Bld: 542 mg/dL (ref 70–99)
HCT: 51 % — ABNORMAL HIGH (ref 36.0–46.0)
HCT: 29 % — ABNORMAL LOW (ref 36.0–46.0)
Hemoglobin: 17.3 g/dL — ABNORMAL HIGH (ref 12.0–15.0)
Hemoglobin: 9.9 g/dL — ABNORMAL LOW (ref 12.0–15.0)
Potassium: 4.3 mmol/L (ref 3.5–5.1)
Potassium: 4.2 mmol/L (ref 3.5–5.1)
Sodium: 135 mmol/L (ref 135–145)
Sodium: 140 mmol/L (ref 135–145)
TCO2: 8 mmol/L — ABNORMAL LOW (ref 22–32)
TCO2: 10 mmol/L — ABNORMAL LOW (ref 22–32)

## 2021-04-10 LAB — CBG MONITORING, ED
Glucose-Capillary: 580 mg/dL (ref 70–99)
Glucose-Capillary: 580 mg/dL (ref 70–99)
Glucose-Capillary: 483 mg/dL — ABNORMAL HIGH (ref 70–99)
Glucose-Capillary: 340 mg/dL — ABNORMAL HIGH (ref 70–99)
Glucose-Capillary: 418 mg/dL — ABNORMAL HIGH (ref 70–99)
Glucose-Capillary: 256 mg/dL — ABNORMAL HIGH (ref 70–99)
Glucose-Capillary: 208 mg/dL — ABNORMAL HIGH (ref 70–99)
Glucose-Capillary: 236 mg/dL — ABNORMAL HIGH (ref 70–99)
Glucose-Capillary: 161 mg/dL — ABNORMAL HIGH (ref 70–99)
Glucose-Capillary: 142 mg/dL — ABNORMAL HIGH (ref 70–99)

## 2021-04-10 LAB — MAGNESIUM: Magnesium: 1.9 mg/dL (ref 1.7–2.4)

## 2021-04-10 LAB — HEMOGLOBIN A1C
Hgb A1c MFr Bld: 14.6 % — ABNORMAL HIGH (ref 4.8–5.6)
Mean Plasma Glucose: 372.32 mg/dL

## 2021-04-10 LAB — I-STAT VENOUS BLOOD GAS, ED
Acid-base deficit: 26 mmol/L — ABNORMAL HIGH (ref 0.0–2.0)
Acid-base deficit: 17 mmol/L — ABNORMAL HIGH (ref 0.0–2.0)
Bicarbonate: 5.5 mmol/L — ABNORMAL LOW (ref 20.0–28.0)
Bicarbonate: 7.5 mmol/L — ABNORMAL LOW (ref 20.0–28.0)
Calcium, Ion: 1.16 mmol/L (ref 1.15–1.40)
Calcium, Ion: 1.25 mmol/L (ref 1.15–1.40)
HCT: 52 % — ABNORMAL HIGH (ref 36.0–46.0)
HCT: 28 % — ABNORMAL LOW (ref 36.0–46.0)
Hemoglobin: 17.7 g/dL — ABNORMAL HIGH (ref 12.0–15.0)
Hemoglobin: 9.5 g/dL — ABNORMAL LOW (ref 12.0–15.0)
O2 Saturation: 37 %
O2 Saturation: 97 %
Potassium: 4.3 mmol/L (ref 3.5–5.1)
Potassium: 4.2 mmol/L (ref 3.5–5.1)
Sodium: 135 mmol/L (ref 135–145)
Sodium: 140 mmol/L (ref 135–145)
TCO2: 6 mmol/L — ABNORMAL LOW (ref 22–32)
TCO2: 8 mmol/L — ABNORMAL LOW (ref 22–32)
pCO2, Ven: 24.7 mmHg — ABNORMAL LOW (ref 44–60)
pCO2, Ven: 15.7 mmHg — CL (ref 44–60)
pH, Ven: 6.953 — CL (ref 7.25–7.43)
pH, Ven: 7.288 (ref 7.25–7.43)
pO2, Ven: 34 mmHg (ref 32–45)
pO2, Ven: 97 mmHg — ABNORMAL HIGH (ref 32–45)

## 2021-04-10 LAB — CBC WITH DIFFERENTIAL/PLATELET
Abs Immature Granulocytes: 0 10*3/uL (ref 0.00–0.07)
Basophils Absolute: 0.2 10*3/uL — ABNORMAL HIGH (ref 0.0–0.1)
Basophils Relative: 1 %
Eosinophils Absolute: 0 10*3/uL (ref 0.0–0.5)
Eosinophils Relative: 0 %
HCT: 49.9 % — ABNORMAL HIGH (ref 36.0–46.0)
Hemoglobin: 15 g/dL (ref 12.0–15.0)
Lymphocytes Relative: 30 %
Lymphs Abs: 4.9 10*3/uL — ABNORMAL HIGH (ref 0.7–4.0)
MCH: 28.7 pg (ref 26.0–34.0)
MCHC: 30.1 g/dL (ref 30.0–36.0)
MCV: 95.6 fL (ref 80.0–100.0)
Monocytes Absolute: 1 10*3/uL (ref 0.1–1.0)
Monocytes Relative: 6 %
Neutro Abs: 10.2 10*3/uL — ABNORMAL HIGH (ref 1.7–7.7)
Neutrophils Relative %: 63 %
Platelets: 557 10*3/uL — ABNORMAL HIGH (ref 150–400)
RBC: 5.22 MIL/uL — ABNORMAL HIGH (ref 3.87–5.11)
RDW: 13.3 % (ref 11.5–15.5)
WBC: 16.2 10*3/uL — ABNORMAL HIGH (ref 4.0–10.5)
nRBC: 0 % (ref 0.0–0.2)
nRBC: 0 /100 WBC

## 2021-04-10 LAB — BETA-HYDROXYBUTYRIC ACID
Beta-Hydroxybutyric Acid: 4.36 mmol/L — ABNORMAL HIGH (ref 0.05–0.27)
Beta-Hydroxybutyric Acid: 2.91 mmol/L — ABNORMAL HIGH (ref 0.05–0.27)

## 2021-04-10 LAB — RAPID URINE DRUG SCREEN, HOSP PERFORMED
Amphetamines: NOT DETECTED
Barbiturates: NOT DETECTED
Benzodiazepines: NOT DETECTED
Cocaine: NOT DETECTED
Opiates: NOT DETECTED
Tetrahydrocannabinol: NOT DETECTED

## 2021-04-10 LAB — RESP PANEL BY RT-PCR (FLU A&B, COVID) ARPGX2
Influenza A by PCR: NEGATIVE
Influenza B by PCR: NEGATIVE
SARS Coronavirus 2 by RT PCR: NEGATIVE

## 2021-04-10 LAB — I-STAT BETA HCG BLOOD, ED (MC, WL, AP ONLY): I-stat hCG, quantitative: <5 m[IU]/mL (ref <5)

## 2021-04-10 LAB — ETHANOL: Alcohol, Ethyl (B): <10 mg/dL (ref <10)

## 2021-04-10 IMAGING — DX DG CHEST 1V PORT
1 series · 1 of 1 positions shown · non-contrast
Comparison: [DATE] and older studies.

CLINICAL DATA: Nausea vomiting.

EXAM:
PORTABLE CHEST 1 VIEW

[chest ap]
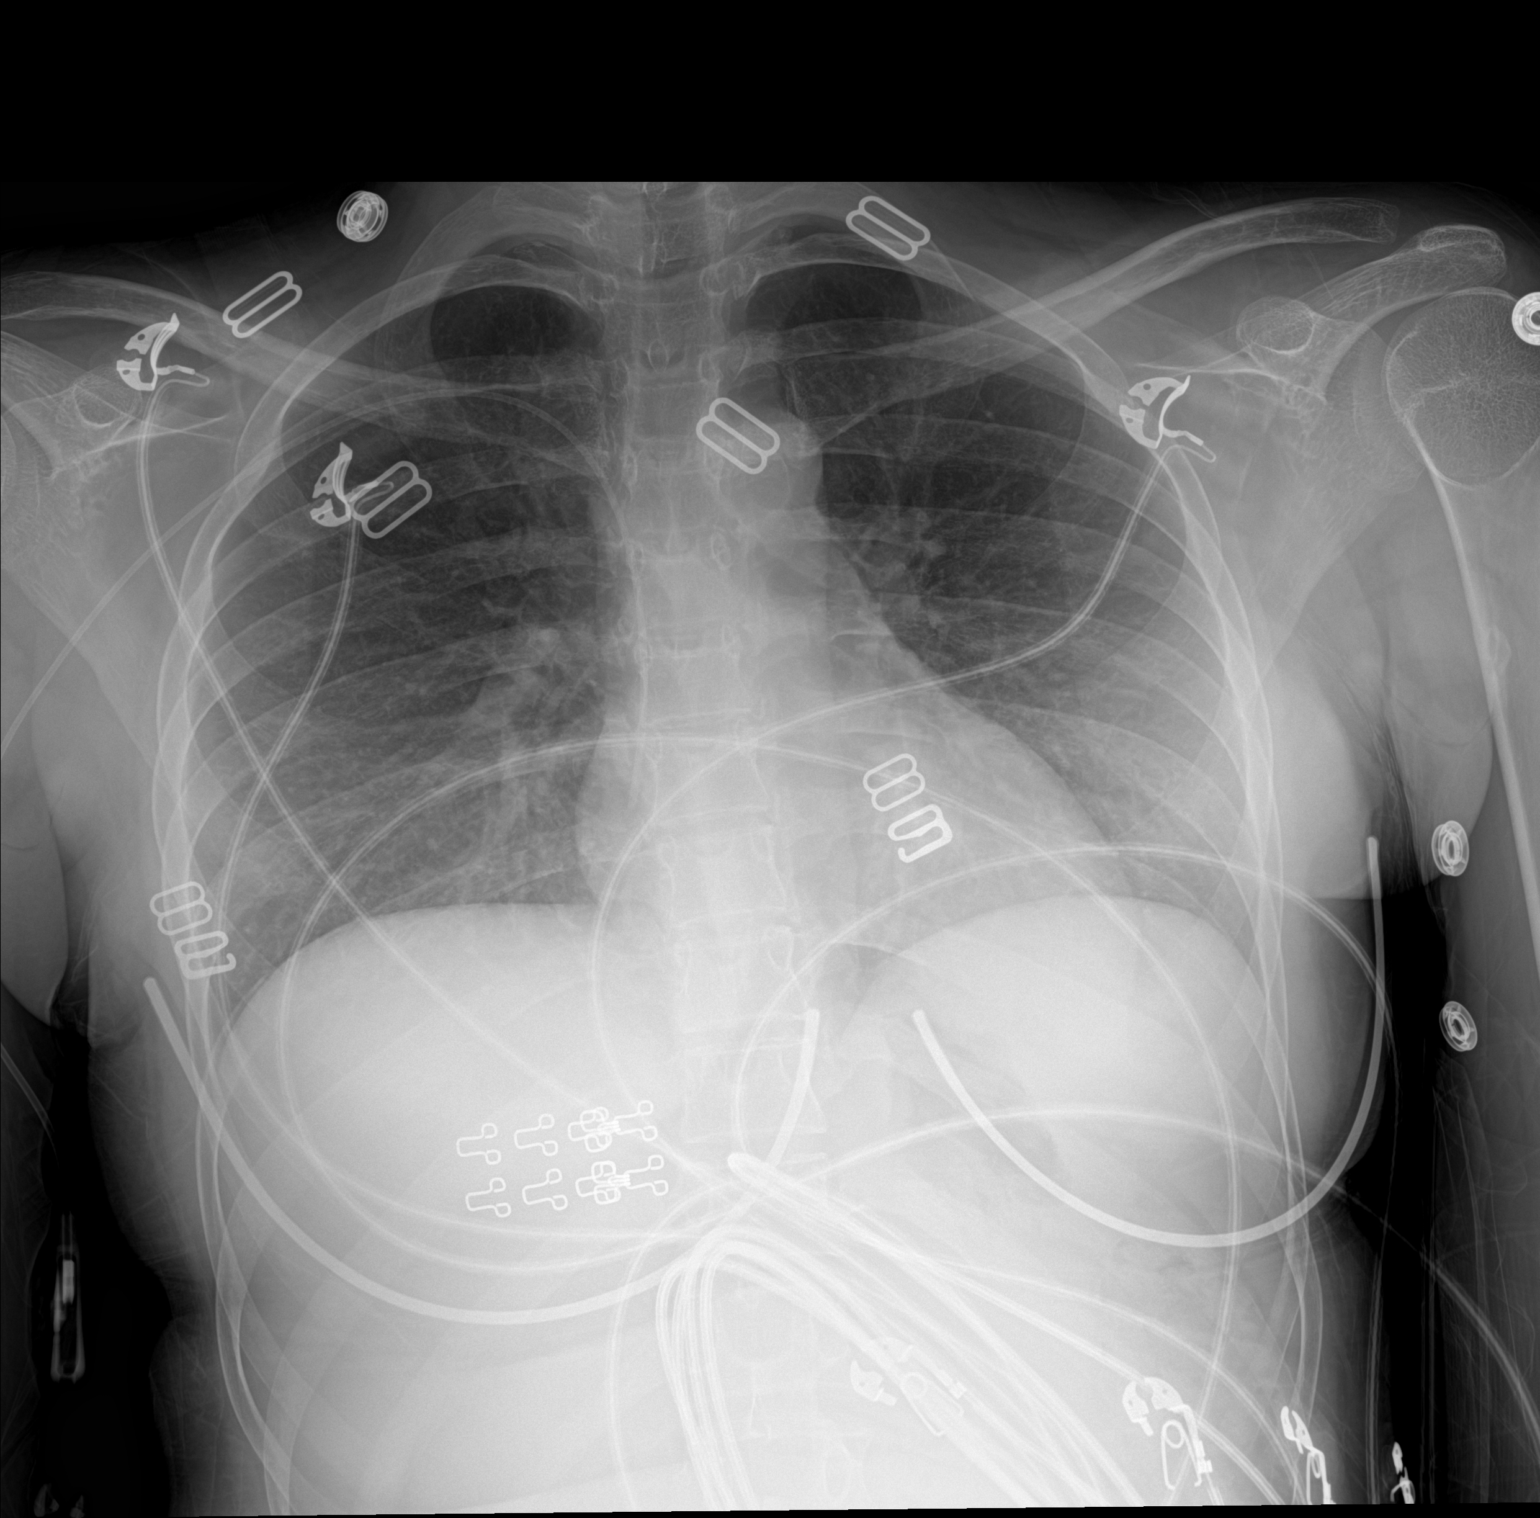

[1 of 1 positions shown; findings below may reference images not displayed]

FINDINGS: Cardiac silhouette is normal in size. Normal mediastinal and hilar
contours. Right-sided PICC has its tip projecting in the right
atrium.

Low lung volumes. Lungs are clear. No convincing pleural effusion or
pneumothorax.

Skeletal structures are grossly intact.
IMPRESSION: No active disease.

## 2021-04-10 MED ORDER — POTASSIUM CHLORIDE 10 MEQ/100ML IV SOLN: 10.0000 meq | INTRAVENOUS | Status: DC

## 2021-04-10 MED ORDER — DEXTROSE IN LACTATED RINGERS 5 % IV SOLN: INTRAVENOUS | Status: DC

## 2021-04-10 MED ORDER — INSULIN REGULAR(HUMAN) IN NACL 100-0.9 UT/100ML-% IV SOLN
INTRAVENOUS | Status: DC
  Administered 2021-04-10: 7 [IU]/h via INTRAVENOUS
  Filled 2021-04-10: qty 100

## 2021-04-10 MED ORDER — LACTATED RINGERS IV SOLN
INTRAVENOUS | Status: DC
Start: 2021-04-10 — End: 2021-04-11

## 2021-04-10 MED ORDER — LACTATED RINGERS IV BOLUS
1000.0000 mL | Freq: Once | INTRAVENOUS | Status: AC
  Administered 2021-04-10: 1000 mL via INTRAVENOUS

## 2021-04-10 MED ORDER — SODIUM CHLORIDE 0.9 % IV SOLN
100.0000 mg | Freq: Two times a day (BID) | INTRAVENOUS | Status: DC
  Administered 2021-04-10 – 2021-04-11 (×2): 100 mg via INTRAVENOUS
  Filled 2021-04-10 (×2): qty 100

## 2021-04-10 MED ORDER — ALBUTEROL SULFATE (2.5 MG/3ML) 0.083% IN NEBU: 3.0000 mL | INHALATION_SOLUTION | Freq: Four times a day (QID) | RESPIRATORY_TRACT | Status: DC | PRN

## 2021-04-10 MED ORDER — DEXTROSE 50 % IV SOLN: 0.0000 mL | INTRAVENOUS | Status: DC | PRN

## 2021-04-10 MED ORDER — INSULIN REGULAR(HUMAN) IN NACL 100-0.9 UT/100ML-% IV SOLN: INTRAVENOUS | Status: DC

## 2021-04-10 MED ORDER — CHLORHEXIDINE GLUCONATE CLOTH 2 % EX PADS
6.0000 | MEDICATED_PAD | Freq: Every day | CUTANEOUS | Status: DC
  Administered 2021-04-11: 6 via TOPICAL

## 2021-04-10 MED ORDER — LACTATED RINGERS IV BOLUS
20.0000 mL/kg | Freq: Once | INTRAVENOUS | Status: AC
  Administered 2021-04-10: 1000 mL via INTRAVENOUS

## 2021-04-10 MED ORDER — LACTATED RINGERS IV SOLN: INTRAVENOUS | Status: DC

## 2021-04-10 MED ORDER — LACTATED RINGERS IV BOLUS
20.0000 mL/kg | Freq: Once | INTRAVENOUS | Status: AC
Start: 2021-04-10 — End: 2021-04-10
  Administered 2021-04-10: 1000 mL via INTRAVENOUS

## 2021-04-10 MED ORDER — SERTRALINE HCL 50 MG PO TABS
75.0000 mg | ORAL_TABLET | Freq: Every day | ORAL | Status: DC
  Administered 2021-04-11: 75 mg via ORAL
  Filled 2021-04-10: qty 1

## 2021-04-10 MED ORDER — ENOXAPARIN SODIUM 40 MG/0.4ML IJ SOSY
40.0000 mg | PREFILLED_SYRINGE | INTRAMUSCULAR | Status: DC
  Filled 2021-04-10: qty 0.4

## 2021-04-10 MED ORDER — SODIUM CHLORIDE 0.9% FLUSH: 10.0000 mL | INTRAVENOUS | Status: DC | PRN

## 2021-04-10 MED ORDER — SODIUM CHLORIDE 0.9% FLUSH
10.0000 mL | Freq: Two times a day (BID) | INTRAVENOUS | Status: DC
  Administered 2021-04-10 – 2021-04-11 (×2): 10 mL

## 2021-04-10 MED ORDER — POTASSIUM CHLORIDE 10 MEQ/100ML IV SOLN
10.0000 meq | INTRAVENOUS | Status: AC
  Administered 2021-04-10 (×2): 10 meq via INTRAVENOUS
  Filled 2021-04-10 (×2): qty 100

## 2021-04-10 MED ORDER — CHLORHEXIDINE GLUCONATE CLOTH 2 % EX PADS: 6.0000 | MEDICATED_PAD | Freq: Every day | CUTANEOUS | Status: DC

## 2021-04-10 NOTE — ED Notes (Signed)
Delay in monitoring CBG d/t sterile procedure in progress. PICC line placement ?

## 2021-04-10 NOTE — Hospital Course (Addendum)
Dawn [name] is a 21 y.o. female who was admitted to [facility] for vomiting and dehydration with labs consistent with DKA, known T1DM. Hospital course is outlined below.   ? ?T1DM  DKA  Acute Encephalopathy   ?In the ED labs were consistent with DKA. Their initial labs were as followed: pH 6.953, glucose 582, CO2 24.7, AG 26.  Patient was started on endotool. Endotool was stopped once the AG was closed x2 and she showed she could tolerate PO intake on 4/9.  She was started on subq insulin, with regimen of Semglee 30 U, Novolog 0-15 correction coverage TID with meals and 0-5U at bedtime.  At the time of discharge, the patient was asymptomatic with well controlled glucose, and discharged understanding of her home insulin regimen. Refilled medications as needed.  ? ?Multiple runs of VTach ?Noted on telemetry, patient was not symptomatic overnight. Obtained EKGs with normal sinus rhythm. Likely related to electrolyte abnormalities in the setting of DKA.  ? ?GC/Chlamydia testing +  Sexually active  ?Tested for GC/Chlamydia in clinic and was found to be positive, was given CTX for gonorrhea testing. Chlamydia treatment ongoing with doxycycline (continued in hospital). Complete doxycycline course outpatient. Receiving depo provera at end of month.  ? ?MDD  ?Continue with home Zoloft 75 qd, stable inpatient.  ? ?THC use  ?Encourage cessation, continue outpatient.  ? ?All other problems chronic and stable and medically managed appropriately.  ? ? ?Items for Follow Up:  ?Ensure completion of doxycycline  ?Needs to receive Depo at end of month  ?Ensure compliance to insulin regimen ?Ensure partner treatment for prior STD ? ? ?
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
## 2021-04-10 NOTE — Progress Notes (Addendum)
FMTS Attending Daily Note: ?Dorris Singh, MD  ?Team Pager [phone] ?Pager [phone]  ?I have seen and examined this patient, reviewed their chart. I have discussed this patient with the resident physician. Edits within note.  ?I agree with the remainder of the findings, exam, and plan below.  ? ?Disposition: possibly home later today.   ? ? ?Family Medicine Teaching Service ?Daily Progress Note ?Intern Pager: [phone] ? ?Patient name: Dawn [name] Medical record number: [id number] ?Date of birth: 2000-11-18 Age: 21 y.o. Gender: female ? ?Primary Care Provider: [name], MD ?Consultants: None ?Code Status: Full ? ?Pt Overview and Major Events to Date:  ?04/08: Admitted FPTS ? ?Assessment and Plan: ?Dawn [name] is a 21 y.o. female presenting with with malaise, n/v, DKA now resolved with insulin. PMH is significant for poorly controlled T1DM, asthma, MDD, migraines.  ? ?Diabetic ketoacidosis, secondary to nonadherence with insulin therapy for multiple days in the setting of type 1 diabetes. ?AG closed x 2. Electrolyte imbalance. K 2.7, Mag 1.5.  Had 5 beats VT this morning, likely secondary to electrolyte imbalance. ?-Transition to Lantus 15 U today, will need to increase to 30 units tomorrow ?-Additional dose Lantus 15 units given today ?-sSSI qac & hs ?-KCL 40 mEq po x1 ?-KCL 10 mEq IV  x 4 ?-MgSO4 4 gm IV x1 ?-Monitor electrolytes and replete as needed ?-Average Carb diet ?-Zofran 4 mg ODT q8h prn to ?-Patient reports she has new glucometer at home and also needs pen needles. ? ?Ventricular tachycardia, asymptomatic and short runs ?-Suspect due to acute illness ?-Repeat EKG to monitor QTc in light of restarting SSRI ?-Repeat electrolytes at 2 PM. ? ?GC&C ?-Transition to p.o. Doxy ? ?Mood disorder ?-Continue sertraline. ? ?FEN/GI: Average Carb diet, Double Lumen PICC  ?PPx: Lovenox ?Dispo:Home pending clinical improvement . Barriers include none.  ? ?Subjective:  ?No acute events overnight.  Mentation back at  baseline.  Denies any chest pain, shortness of breath, nausea, vomiting, abdominal pain or lower extremity edema. ? ?Objective: ?Temp:  [97.5 ?F (36.4 ?C)-98.3 ?F (36.8 ?C)] 98 ?F (36.7 ?C) (04/08 1935) ?Pulse Rate:  [99-130] 99 (04/08 1935) ?Resp:  [14-33] 14 (04/08 1935) ?BP: (94-148)/(63-120) 123/95 (04/08 1935) ?SpO2:  [100 %] 100 % (04/08 1935) ?Weight:  [50 kg] 50 kg (04/08 1030) ?Physical Exam: ?General: 21 y.o. female in NAD ?Cardio: RRR no m/r/g ?Lungs: CTAB, no wheezing, no rhonchi, no crackles, no IWOB on room air ?Abdomen: Soft, non-tender to palpation, non-distended, positive bowel sounds ?Skin: warm and dry ?Extremities: No edema  ? ?Laboratory: ?Recent Labs  ?Lab 04/05/21 ?1110 04/10/21 ?[id number] 04/10/21 ?[id number] 04/10/21 ?1015 04/10/21 ?1412 04/10/21 ?1413  ?WBC 7.2 16.2*  --   --   --   --   ?HGB 11.5 15.0   < > 17.7*  17.3* 9.9* 9.5*  ?HCT 35.2 49.9*   < > 52.0*  51.0* 29.0* 28.0*  ?PLT 412 557*  --   --   --   --   ? < > = values in this interval not displayed.  ? ?Recent Labs  ?Lab 04/05/21 ?1110 04/05/21 ?1110 04/10/21 ?[id number] 04/10/21 ?1015 04/10/21 ?1412 04/10/21 ?1413 04/10/21 ?1511  ?NA 136   < > 132* 135  135 140 140 139  ?K 4.7  --  4.4 4.3  4.3 4.2 4.2 3.9  ?CL 99  --  100 111 116*  --  115*  ?CO2 22  --  <7*  --   --   --  9*  ?  BUN 16   < > 28* 28* 16  --  17  ?CREATININE 0.60  --  1.43* 0.70 <0.20*  --  0.95  ?CALCIUM 9.7  --  10.3  --   --   --  8.6*  ?PROT 7.2  --   --   --   --   --   --   ?BILITOT <0.2  --   --   --   --   --   --   ?ALKPHOS 141*  --   --   --   --   --   --   ?ALT 8  --   --   --   --   --   --   ?AST 8  --   --   --   --   --   --   ?GLUCOSE 442*   < > 582* 573* 542*  --  559*  ? < > = values in this interval not displayed.  ? ? ?Imaging/Diagnostic Tests: ?DG Chest Port 1 View ? ?Result Date: 04/10/2021 ?CLINICAL DATA:  Nausea vomiting. EXAM: PORTABLE CHEST 1 VIEW COMPARISON:  11/26/2020 and older studies. FINDINGS: Cardiac silhouette is normal in size. Normal  mediastinal and hilar contours. Right-sided PICC has its tip projecting in the right atrium. Low lung volumes. Lungs are clear. No convincing pleural effusion or pneumothorax. Skeletal structures are grossly intact. IMPRESSION: No active disease. Electronically Signed   By: Lajean Manes M.D.   On: 04/10/2021 16:40  ? ?[place] EKG SITE RITE ? ?Result Date: 04/10/2021 ?If [employer] not attached, placement could not be confirmed due to current cardiac rhythm.   ? ?[name], MD ?04/10/2021, 9:27 PM ?PGY-3, [place] ?Man Intern pager: [phone], text pages welcome  ?

## 2021-04-10 NOTE — ED Triage Notes (Signed)
Pt arrives POV for eval of malaise, N/V, tachypnea and hyperglycemia. Pt reports she is a Type I diabetic, but has had trouble getting her dexcom equipment. CBG 580 on arrival, kussmaul respirations, tachycardic. Reports extensive hx of DKA admissions. Also states recently on abx for pyelo/UTI.  ?

## 2021-04-10 NOTE — ED Notes (Signed)
IV  
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
## 2021-04-10 NOTE — Progress Notes (Signed)
Peripherally Inserted Central Catheter Placement ? ?The IV Nurse has discussed with the patient and/or persons authorized to consent for the patient, the purpose of this procedure and the potential benefits and risks involved with this procedure.  The benefits include less needle sticks, lab draws from the catheter, and the patient may be discharged home with the catheter. Risks include, but not limited to, infection, bleeding, blood clot (thrombus formation), and puncture of an artery; nerve damage and irregular heartbeat and possibility to perform a PICC exchange if needed/ordered by physician.  Alternatives to this procedure were also discussed.  Bard Power PICC patient education guide, fact sheet on infection prevention and patient information card has been provided to patient /or left at bedside.  [facility], bedside RN to remove PIV, as it has fluids/insulin infusing. ? ?PICC Placement Documentation  ?PICC Double Lumen 04/10/21 Right Brachial 32 cm 1 cm (Active)  ?Indication for Insertion or Continuance of Line Prolonged intravenous therapies;Chronic illness with exacerbations (CF, Sickle Cell, etc.);Limited venous access - need for IV therapy >5 days (PICC only) 04/10/21 1410  ?Exposed Catheter (cm) 1 cm 04/10/21 1410  ?Site Assessment Clean, Dry, Intact 04/10/21 1410  ?Lumen #1 Status Flushed;Saline locked;Blood return noted 04/10/21 1410  ?Lumen #2 Status Flushed;Saline locked;Blood return noted 04/10/21 1410  ?Dressing Type Securing device;Transparent 04/10/21 1410  ?Dressing Status Antimicrobial disc in place 04/10/21 1410  ?Safety Lock Not Applicable [id number]  ?Line Care Connections checked and tightened 04/10/21 1410  ?Line Adjustment (NICU/IV Team Only) No 04/10/21 1410  ?Dressing Intervention New dressing 04/10/21 1410  ?Dressing Change Due 04/17/21 04/10/21 1410  ? ? ? ? ? ?Mickel Baas  Dean Wonder ?04/10/2021, 2:12 PM ? ?

## 2021-04-10 NOTE — Progress Notes (Signed)
Dear Doctor:  This patient has been identified as a candidate for PICC for the following reason (s): IV therapy over 48 hours, poor veins/poor circulatory system (CHF, COPD, emphysema, diabetes, steroid use, IV drug abuse, etc.), and incompatible drugs (aminophyllin, TPN, heparin, given with an antibiotic) If you agree, please write an order for the indicated device. For any questions contact the Vascular Access Team at [phone] if no answer, please leave a message. ? ?Thank you for supporting the early vascular access assessment program. ?

## 2021-04-10 NOTE — ED Provider Notes (Signed)
?[place] ?Provider Note ? ? ?CSN: [id number] ?Arrival date & time: 04/10/21  1245 ? ?  ? ?History ? ?Chief Complaint  ?Patient presents with  ? Nausea  ? Vomiting  ? ? ?Dawn [name] is a 21 y.o. female. ? ?Pt complains of nausea, vomiting, cramping and glucose being elevated.  Pt reports she has not taken her insulin.  Pt reports she became sick yesterday after a church event.  Pt unable to check her glucose at home currently.  Pt has a history of DKA with multiple admissions in the past  ? ?The history is provided by the patient. No language interpreter was used.  ? ?  ? ?Home Medications ?Prior to Admission medications   ?Medication Sig Start Date End Date Taking? Authorizing Provider  ?Accu-Chek Softclix Lancets lancets Please use to check blood sugar up to 6 times/day. ?Patient not taking: Reported on 03/10/2021 07/01/19   [name], MD  ?albuterol (VENTOLIN HFA) 108 (90 Base) MCG/ACT inhaler Inhale 2 puffs into the lungs every 6 (six) hours as needed for wheezing or shortness of breath. 03/24/21   [name], MD  ?albuterol (VENTOLIN HFA) 108 (90 Base) MCG/ACT inhaler INHALE 2 PUFFS INTO THE LUNGS EVERY 6 HOURS AS NEEDED FOR WHEEZING OR SHORTNESS OF BREATH 03/22/21   [name], MD  ?Blood Glucose Monitoring Suppl (ACCU-CHEK GUIDE) w/Device KIT 1 kit by Does not apply route daily as needed. ?Patient not taking: Reported on 03/10/2021 07/01/19   [name], MD  ?ciprofloxacin (CIPRO) 500 MG tablet Take 1 tablet (500 mg total) by mouth 2 (two) times daily. 04/05/21   [name], MD  ?Continuous Blood Gluc Sensor (FREESTYLE LIBRE 2 SENSOR) MISC Change sensor every 14 days. ?Patient taking differently: Inject 1 Device into the skin every 14 (fourteen) days. 11/07/19   Sherrlyn Hock, MD  ?Continuous Blood Gluc Transmit (DEXCOM G6 TRANSMITTER) MISC See admin instructions. 07/22/20   [provider]  ?doxycycline (VIBRA-TABS) 100 MG tablet Take 1 tablet (100 mg  total) by mouth 2 (two) times daily for 7 days. 04/07/21 04/14/21  [name], MD  ?glucagon 1 MG injection Inject 1 mg IM for severe hypoglycemia ?Patient taking differently: Inject 1 mg into the muscle once as needed ("for severe hypoglycemia"). 01/08/21   Eppie Gibson, MD  ?glucose blood (ACCU-CHEK GUIDE) test strip Use to check glucose 6x daily ?Patient not taking: Reported on 03/10/2021 08/17/20   Dickie La, MD  ?insulin glargine (LANTUS SOLOSTAR) 100 UNIT/ML Solostar Pen Inject 30 Units into the skin at bedtime. 03/11/21   Dameron, Luna Fuse, DO  ?insulin lispro (HUMALOG KWIKPEN) 100 UNIT/ML KwikPen Inject 5 Units into the skin with breakfast, with lunch, and with evening meal. 03/11/21 04/10/21  Orvis Brill, DO  ?Insulin Pen Needle (BD PEN NEEDLE NANO 2ND GEN) 32G X 4 MM MISC USE TO INJECT INSULIN 6 TIMES DAILY 03/24/21   [name], MD  ?Insulin Syringe-Needle U-100 (INSULIN SYRINGE .5CC/30GX1/2") 30G X 1/2" 0.5 ML MISC Use to draw up Novolog three times daily. ?Patient not taking: Reported on 03/10/2021 06/08/20   Sharion Settler, DO  ?Insulin Syringe-Needle U-100 (INSULIN SYRINGE 1CC/30GX1/2") 30G X 1/2" 1 ML MISC Use to draw up Lantus daily. ?Patient not taking: Reported on 03/10/2021 06/08/20   Sharion Settler, DO  ?sertraline (ZOLOFT) 50 MG tablet Take 1.5 tablets (75 mg total) by mouth daily. 03/11/21   Dameron, Luna Fuse, DO  ?SUMAtriptan (IMITREX) 25 MG tablet Take 0.5 tablets (12.5 mg  total) by mouth every 2 (two) hours as needed for migraine. May repeat in 2 hours if headache persists or recurs. 03/22/21   [name], MD  ?   ? ?Allergies    ?Latex, Tape, and Shellfish allergy   ? ?Review of Systems   ?Review of Systems  ?Gastrointestinal:  Positive for vomiting.  ?All other systems reviewed and are negative. ? ?Physical Exam ?Updated Vital Signs ?BP (!) 144/106   Pulse (!) 125   Temp (!) 97.5 ?F (36.4 ?C) (Oral)   Resp (!) 24   Ht _0  (1.575 m)   Wt 50 kg   LMP 03/28/2021 (Approximate)   SpO2  100%   BMI 20.16 kg/m?  ?Physical Exam ?Vitals reviewed.  ?Constitutional:   ?   Appearance: Normal appearance. She is ill-appearing.  ?HENT:  ?   Head: Normocephalic.  ?   Mouth/Throat:  ?   Mouth: Mucous membranes are dry.  ?Eyes:  ?   Extraocular Movements: Extraocular movements intact.  ?   Pupils: Pupils are equal, round, and reactive to light.  ?Cardiovascular:  ?   Rate and Rhythm: Tachycardia present.  ?Pulmonary:  ?   Breath sounds: No stridor. No wheezing or rhonchi.  ?   Comments: Tachypnea,  ?Abdominal:  ?   General: Abdomen is flat.  ?   Palpations: Abdomen is soft.  ?Musculoskeletal:     ?   General: Normal range of motion.  ?   Cervical back: Normal range of motion.  ?Skin: ?   General: Skin is warm.  ?   Coloration: Skin is pale.  ?Neurological:  ?   General: No focal deficit present.  ?   Mental Status: She is alert.  ?Psychiatric:     ?   Mood and Affect: Mood normal.  ? ? ?ED Results / Procedures / Treatments   ?Labs ?(all labs ordered are listed, but only abnormal results are displayed) ?Labs Reviewed  ?BASIC METABOLIC PANEL - Abnormal; Notable for the following components:  ?    Result Value  ? Sodium 132 (*)   ? CO2 <7 (*)   ? Glucose, Bld 582 (*)   ? BUN 28 (*)   ? Creatinine, Ser 1.43 (*)   ? GFR, Estimated 54 (*)   ? All other components within normal limits  ?CBC WITH DIFFERENTIAL/PLATELET - Abnormal; Notable for the following components:  ? WBC 16.2 (*)   ? RBC 5.22 (*)   ? HCT 49.9 (*)   ? Platelets 557 (*)   ? Neutro Abs 10.2 (*)   ? Lymphs Abs 4.9 (*)   ? Basophils Absolute 0.2 (*)   ? All other components within normal limits  ?CBG MONITORING, ED - Abnormal; Notable for the following components:  ? Glucose-Capillary 580 (*)   ? All other components within normal limits  ?CBG MONITORING, ED - Abnormal; Notable for the following components:  ? Glucose-Capillary 580 (*)   ? All other components within normal limits  ?I-STAT VENOUS BLOOD GAS, ED - Abnormal; Notable for the following  components:  ? pH, Ven 6.953 (*)   ? pCO2, Ven 24.7 (*)   ? Bicarbonate 5.5 (*)   ? TCO2 6 (*)   ? Acid-base deficit 26.0 (*)   ? HCT 52.0 (*)   ? Hemoglobin 17.7 (*)   ? All other components within normal limits  ?I-STAT CHEM 8, ED - Abnormal; Notable for the following components:  ? BUN 28 (*)   ?  Glucose, Bld 573 (*)   ? TCO2 8 (*)   ? Hemoglobin 17.3 (*)   ? HCT 51.0 (*)   ? All other components within normal limits  ?CBG MONITORING, ED - Abnormal; Notable for the following components:  ? Glucose-Capillary 483 (*)   ? All other components within normal limits  ?RESP PANEL BY RT-PCR (FLU A&B, COVID) ARPGX2  ?BLOOD GAS, VENOUS  ?BASIC METABOLIC PANEL  ?BASIC METABOLIC PANEL  ?BASIC METABOLIC PANEL  ?BETA-HYDROXYBUTYRIC ACID  ?BETA-HYDROXYBUTYRIC ACID  ?URINALYSIS, ROUTINE W REFLEX MICROSCOPIC  ?I-STAT BETA HCG BLOOD, ED ([facility], [facility], [facility] ONLY)  ?I-STAT VENOUS BLOOD GAS, ED  ? ? ?EKG ?None ? ?Radiology ?[place] EKG SITE RITE ? ?Result Date: 04/10/2021 ?If [employer] not attached, placement could not be confirmed due to current cardiac rhythm.  ? ?Procedures ?Mar[facility] KitchenCritical Care ?Performed by: Fransico Meadow, PA-C ?Authorized by: Fransico Meadow, PA-C  ? ?Critical care provider statement:  ?  Critical care time (minutes):  30 ?  Critical care start time:  04/10/2021 10:00 AM ?  Critical care end time:  04/10/2021 12:00 PM ?  Critical care time was exclusive of:  Separately billable procedures and treating other patients and teaching time ?  Critical care was necessary to treat or prevent imminent or life-threatening deterioration of the following conditions:  Cardiac failure, circulatory failure, CNS failure or compromise, dehydration and endocrine crisis ?  Critical care was time spent personally by me on the following activities:  Development of treatment plan with patient or surrogate, discussions with consultants, evaluation of patient's response to treatment, examination of patient, ordering and review of laboratory studies,  ordering and review of radiographic studies, ordering and performing treatments and interventions, pulse oximetry, re-evaluation of patient's condition and review of old charts ?  Care discussed with: admitting provider

## 2021-04-11 DIAGNOSIS — R110 Nausea: Secondary | ICD-10-CM | POA: Diagnosis not present

## 2021-04-11 DIAGNOSIS — E1010 Type 1 diabetes mellitus with ketoacidosis without coma: Secondary | ICD-10-CM | POA: Diagnosis not present

## 2021-04-11 DIAGNOSIS — I472 Ventricular tachycardia, unspecified: Secondary | ICD-10-CM | POA: Diagnosis not present

## 2021-04-11 DIAGNOSIS — A749 Chlamydial infection, unspecified: Secondary | ICD-10-CM

## 2021-04-11 DIAGNOSIS — Z20822 Contact with and (suspected) exposure to covid-19: Secondary | ICD-10-CM | POA: Diagnosis not present

## 2021-04-11 DIAGNOSIS — A549 Gonococcal infection, unspecified: Secondary | ICD-10-CM | POA: Diagnosis not present

## 2021-04-11 LAB — GLUCOSE, CAPILLARY
Glucose-Capillary: 111 mg/dL — ABNORMAL HIGH (ref 70–99)
Glucose-Capillary: 116 mg/dL — ABNORMAL HIGH (ref 70–99)
Glucose-Capillary: 119 mg/dL — ABNORMAL HIGH (ref 70–99)
Glucose-Capillary: 124 mg/dL — ABNORMAL HIGH (ref 70–99)
Glucose-Capillary: 148 mg/dL — ABNORMAL HIGH (ref 70–99)
Glucose-Capillary: 171 mg/dL — ABNORMAL HIGH (ref 70–99)
Glucose-Capillary: 205 mg/dL — ABNORMAL HIGH (ref 70–99)
Glucose-Capillary: 240 mg/dL — ABNORMAL HIGH (ref 70–99)
Glucose-Capillary: 282 mg/dL — ABNORMAL HIGH (ref 70–99)
Glucose-Capillary: 336 mg/dL — ABNORMAL HIGH (ref 70–99)
Glucose-Capillary: 406 mg/dL — ABNORMAL HIGH (ref 70–99)

## 2021-04-11 LAB — BASIC METABOLIC PANEL
Anion gap: 9 (ref 5–15)
Anion gap: 5 (ref 5–15)
Anion gap: 7 (ref 5–15)
BUN: 9 mg/dL (ref 6–20)
BUN: 9 mg/dL (ref 6–20)
BUN: 10 mg/dL (ref 6–20)
CO2: 20 mmol/L — ABNORMAL LOW (ref 22–32)
CO2: 21 mmol/L — ABNORMAL LOW (ref 22–32)
CO2: 19 mmol/L — ABNORMAL LOW (ref 22–32)
Calcium: 9.2 mg/dL (ref 8.9–10.3)
Calcium: 8.3 mg/dL — ABNORMAL LOW (ref 8.9–10.3)
Calcium: 8.2 mg/dL — ABNORMAL LOW (ref 8.9–10.3)
Chloride: 108 mmol/L (ref 98–111)
Chloride: 111 mmol/L (ref 98–111)
Chloride: 110 mmol/L (ref 98–111)
Creatinine, Ser: 0.63 mg/dL (ref 0.44–1.00)
Creatinine, Ser: 0.49 mg/dL (ref 0.44–1.00)
Creatinine, Ser: 0.65 mg/dL (ref 0.44–1.00)
GFR, Estimated: >60 mL/min (ref >60)
GFR, Estimated: >60 mL/min (ref >60)
GFR, Estimated: >60 mL/min (ref >60)
Glucose, Bld: 123 mg/dL — ABNORMAL HIGH (ref 70–99)
Glucose, Bld: 153 mg/dL — ABNORMAL HIGH (ref 70–99)
Glucose, Bld: 421 mg/dL — ABNORMAL HIGH (ref 70–99)
Potassium: 2.9 mmol/L — ABNORMAL LOW (ref 3.5–5.1)
Potassium: 3.6 mmol/L (ref 3.5–5.1)
Potassium: 4.4 mmol/L (ref 3.5–5.1)
Sodium: 137 mmol/L (ref 135–145)
Sodium: 137 mmol/L (ref 135–145)
Sodium: 136 mmol/L (ref 135–145)

## 2021-04-11 LAB — URINE CULTURE: Culture: NO GROWTH

## 2021-04-11 LAB — BETA-HYDROXYBUTYRIC ACID: Beta-Hydroxybutyric Acid: 1.06 mmol/L — ABNORMAL HIGH (ref 0.05–0.27)

## 2021-04-11 LAB — MAGNESIUM: Magnesium: 1.5 mg/dL — ABNORMAL LOW (ref 1.7–2.4)

## 2021-04-11 MED ORDER — POTASSIUM CHLORIDE CRYS ER 20 MEQ PO TBCR
40.0000 meq | EXTENDED_RELEASE_TABLET | Freq: Once | ORAL | Status: AC
  Administered 2021-04-11: 40 meq via ORAL
  Filled 2021-04-11: qty 2

## 2021-04-11 MED ORDER — DOXYCYCLINE HYCLATE 100 MG PO TABS: 100.0000 mg | ORAL_TABLET | Freq: Two times a day (BID) | ORAL | Status: DC

## 2021-04-11 MED ORDER — ACCU-CHEK GUIDE VI STRP: ORAL_STRIP | 1 refills | Status: DC

## 2021-04-11 MED ORDER — ACCU-CHEK SOFTCLIX LANCETS MISC: 13 refills | Status: DC

## 2021-04-11 MED ORDER — MAGNESIUM SULFATE 4 GM/100ML IV SOLN
4.0000 g | Freq: Once | INTRAVENOUS | Status: AC
  Administered 2021-04-11: 4 g via INTRAVENOUS
  Filled 2021-04-11: qty 100

## 2021-04-11 MED ORDER — INSULIN ASPART 100 UNIT/ML IJ SOLN: 15.0000 [IU] | Freq: Once | INTRAMUSCULAR | Status: DC

## 2021-04-11 MED ORDER — INSULIN ASPART 100 UNIT/ML IJ SOLN
0.0000 [IU] | Freq: Three times a day (TID) | INTRAMUSCULAR | Status: DC
  Administered 2021-04-11: 15 [IU] via SUBCUTANEOUS
  Administered 2021-04-11: 5 [IU] via SUBCUTANEOUS

## 2021-04-11 MED ORDER — INSULIN GLARGINE-YFGN 100 UNIT/ML ~~LOC~~ SOLN
15.0000 [IU] | Freq: Once | SUBCUTANEOUS | Status: AC
  Administered 2021-04-11: 15 [IU] via SUBCUTANEOUS
  Filled 2021-04-11: qty 0.15

## 2021-04-11 MED ORDER — PANTOPRAZOLE SODIUM 40 MG PO TBEC
40.0000 mg | DELAYED_RELEASE_TABLET | Freq: Every day | ORAL | Status: DC
  Administered 2021-04-11: 40 mg via ORAL
  Filled 2021-04-11: qty 1

## 2021-04-11 MED ORDER — DEXCOM G6 SENSOR MISC
1.0000 [IU] | 0 refills | Status: DC
Start: 2021-04-11 — End: 2021-04-15

## 2021-04-11 MED ORDER — LIP MEDEX EX OINT
TOPICAL_OINTMENT | CUTANEOUS | Status: DC | PRN
  Filled 2021-04-11: qty 7

## 2021-04-11 MED ORDER — BD PEN NEEDLE NANO 2ND GEN 32G X 4 MM MISC: 5 refills | Status: DC

## 2021-04-11 MED ORDER — INSULIN GLARGINE-YFGN 100 UNIT/ML ~~LOC~~ SOLN
30.0000 [IU] | SUBCUTANEOUS | Status: DC
  Filled 2021-04-11: qty 0.3

## 2021-04-11 MED ORDER — INSULIN ASPART 100 UNIT/ML IJ SOLN: 0.0000 [IU] | Freq: Every day | INTRAMUSCULAR | Status: DC

## 2021-04-11 MED ORDER — INSULIN GLARGINE-YFGN 100 UNIT/ML ~~LOC~~ SOLN
15.0000 [IU] | SUBCUTANEOUS | Status: DC
  Administered 2021-04-11: 15 [IU] via SUBCUTANEOUS
  Filled 2021-04-11: qty 0.15

## 2021-04-11 MED ORDER — POTASSIUM CHLORIDE 10 MEQ/100ML IV SOLN
10.0000 meq | INTRAVENOUS | Status: AC
  Administered 2021-04-11 (×4): 10 meq via INTRAVENOUS
  Filled 2021-04-11 (×4): qty 100

## 2021-04-11 MED ORDER — ACCU-CHEK GUIDE W/DEVICE KIT: 1.0000 | PACK | Freq: Every day | 0 refills | Status: DC | PRN

## 2021-04-11 NOTE — Discharge Instructions (Addendum)
Dear Dawn [name],  ? ?Thank you so much for allowing [place] to be part of your care!  You were admitted to [facility] for diabetic ketoacidosis and were treated with insulin and fluids. ? ?Please go pick up supplies and prescriptions from your pharmacy.  ? ?Complete your course of doxycycline.  ? ?POST-HOSPITAL & CARE INSTRUCTIONS ?Please let PCP/Specialists know of any changes that were made.  ?Please see medications section of this packet for any medication changes.  ? ?DOCTOR'S APPOINTMENT & FOLLOW UP CARE INSTRUCTIONS  ?No future appointments. ? ?RETURN PRECAUTIONS: ? ?Fever/chills, chest pain, SOB  ? ?Take care and be well! ? ?Family Medicine Teaching Service  ?[place]  ?[facility]  ?[address] [place], [place] [address] ?[phone] ? ?

## 2021-04-11 NOTE — Plan of Care (Signed)

## 2021-04-11 NOTE — Discharge Summary (Signed)
Family Medicine Teaching Service ?Hospital Discharge Summary ? ?Patient name: Dawn [name] Medical record number: [id number] ?Date of birth: 25-Jul-2000 Age: 21 y.o. Gender: female ?Date of Admission: 04/10/2021  Date of Discharge: 04/11/2021 ?Admitting Physician: France Ravens, MD ? ?Primary Care Provider: [name], MD ?Consultants: None  ? ?Indication for Hospitalization: DKA  ? ?Discharge Diagnoses/Problem List:  ?Active Problems: ?  DKA (diabetic ketoacidosis) ([place]) ?GC/chlamydia positive  ? ? ? ?Disposition: Home  ? ?Discharge Condition: Stable  ? ?Discharge Exam: Blood pressure 112/74, pulse 99, temperature 98 ?F (36.7 ?C), temperature source Oral, resp. rate (!) 23, height _0  (1.575 m), weight 50 kg, last menstrual period 03/28/2021, SpO2 97 %. ? ?Physical Exam: ?General: 21 y.o. female in NAD ?Cardio: RRR no m/r/g ?Lungs: CTAB, no wheezing, no rhonchi, no crackles, no IWOB on room air ?Abdomen: Soft, non-tender to palpation, non-distended, positive bowel sounds ?Skin: warm and dry ?Extremities: No edema  ? ?Exam performed by Dr. Volanda Napoleon  ? ?Brief Hospital Course:  ?Dawn [name] is a 21 y.o. female who was admitted to [facility] for vomiting and dehydration with labs consistent with DKA, known T1DM. Hospital course is outlined below.   ? ?T1DM  DKA  Acute Encephalopathy   ?In the ED labs were consistent with DKA. Their initial labs were as followed: pH 6.953, glucose 582, CO2 24.7, AG 26.  Patient was started on endotool. Endotool was stopped once the AG was closed x2 and she showed she could tolerate PO intake on 4/9.  She was started on subq insulin, with regimen of Semglee 30 U, Novolog 0-15 correction coverage TID with meals and 0-5U at bedtime.  At the time of discharge, the patient was asymptomatic with well controlled glucose, and discharged understanding of her home insulin regimen. Refilled medications as needed.  ? ?Multiple runs of VTach ?Noted on telemetry, patient  was not symptomatic overnight. Obtained EKGs with normal sinus rhythm. Likely related to electrolyte abnormalities in the setting of DKA.  ? ?GC/Chlamydia testing +  Sexually active  ?Tested for GC/Chlamydia in clinic and was found to be positive, was given CTX for gonorrhea testing. Chlamydia treatment ongoing with doxycycline (continued in hospital). Complete doxycycline course outpatient. Receiving depo provera at end of month.  ? ?MDD  ?Continue with home Zoloft 75 qd, stable inpatient.  ? ?THC use  ?Encourage cessation, continue outpatient.  ? ?All other problems chronic and stable and medically managed appropriately.  ? ? ?Items for Follow Up:  ?Ensure completion of doxycycline  ?Needs to receive Depo at end of month  ?Ensure compliance to insulin regimen ?Ensure partner treatment for prior STD ? ? ?Significant Procedures: None  ? ?Significant Labs and Imaging:  ?Recent Labs  ?Lab 04/05/21 ?1110 04/10/21 ?7124 04/10/21 ?5809 04/10/21 ?1015 04/10/21 ?1412 04/10/21 ?1413  ?WBC 7.2 16.2*  --   --   --   --   ?HGB 11.5 15.0   < > 17.7*  17.3* 9.9* 9.5*  ?HCT 35.2 49.9*   < > 52.0*  51.0* 29.0* 28.0*  ?PLT 412 557*  --   --   --   --   ? < > = values in this interval not displayed.  ? ?Recent Labs  ?Lab 04/05/21 ?1110 04/10/21 ?9833 04/10/21 ?1511 04/10/21 ?2130 04/11/21 ?0149 04/11/21 ?8250 04/11/21 ?1506  ?NA 136   < > 139 139 137 137 136  ?K 4.7   < > 3.9 4.3 2.9* 3.6 4.4  ?CL 99   < >  115* 115* 108 111 110  ?CO2 22   < > 9* 13* 20* 21* 19*  ?GLUCOSE 442*   < > 559* 129* 123* 153* 421*  ?BUN 16   < > _0 ?CREATININE 0.60   < > 0.95 0.68 0.63 0.49 0.65  ?CALCIUM 9.7   < > 8.6* 8.6* 9.2 8.3* 8.2*  ?MG  --   --   --  1.9  --  1.5*  --   ?ALKPHOS 141*  --   --   --   --   --   --   ?AST 8  --   --   --   --   --   --   ?ALT 8  --   --   --   --   --   --   ?ALBUMIN 3.7*  --   --   --   --   --   --   ? < > = values in this interval not displayed.  ? ? ?DG Chest Port 1 View ? ?Result Date:  04/10/2021 ?CLINICAL DATA:  Nausea vomiting. EXAM: PORTABLE CHEST 1 VIEW COMPARISON:  11/26/2020 and older studies. FINDINGS: Cardiac silhouette is normal in size. Normal mediastinal and hilar contours. Right-sided PICC has its tip projecting in the right atrium. Low lung volumes. Lungs are clear. No convincing pleural effusion or pneumothorax. Skeletal structures are grossly intact. IMPRESSION: No active disease. Electronically Signed   By: Lajean Manes M.D.   On: 04/10/2021 16:40  ? ?[place] EKG SITE RITE ? ?Result Date: 04/10/2021 ?If [employer] not attached, placement could not be confirmed due to current cardiac rhythm.  ? ? ?Results/Tests Pending at Time of Discharge: None  ? ?Discharge Medications:  ?Allergies as of 04/11/2021   ? ?   Reactions  ? Latex Other (See Comments)  ? Causes skin irritation  ? Tape Other (See Comments)  ? Causes skin irritation  ? Shellfish Allergy Rash, Other (See Comments)  ? Reaction to scallops  ? ?  ? ?  ?Medication List  ?  ? ?STOP taking these medications   ? ?ciprofloxacin 500 MG tablet ?Commonly known as: Cipro ?  ?glucagon 1 MG injection ?  ?oxyCODONE 5 MG immediate release tablet ?Commonly known as: Oxy IR/ROXICODONE ?  ? ?  ? ?TAKE these medications   ? ?Accu-Chek Guide test strip ?Generic drug: glucose blood ?Use to check glucose 6x daily ?  ?Accu-Chek Guide w/Device Kit ?1 kit by Does not apply route daily as needed. ?  ?Accu-Chek Softclix Lancets lancets ?Please use to check blood sugar up to 6 times/day. ?  ?albuterol (2.5 MG/3ML) 0.083% nebulizer solution ?Commonly known as: PROVENTIL ?Take 2.5 mg by nebulization every 6 (six) hours as needed for wheezing or shortness of breath. ?  ?albuterol 108 (90 Base) MCG/ACT inhaler ?Commonly known as: VENTOLIN HFA ?Inhale 2 puffs into the lungs every 6 (six) hours as needed for wheezing or shortness of breath. ?  ?Baqsimi One Pack 3 MG/DOSE Powd ?Generic drug: Glucagon ?Place 1 Dose into the nose once as needed (low blood sugar). ?   ?BD Pen Needle Nano 2nd Gen 32G X 4 MM Misc ?Generic drug: Insulin Pen Needle ?USE TO INJECT INSULIN 6 TIMES DAILY ?  ?Dexcom G6 Sensor Misc ?1 Units by Does not apply route continuous. ?What changed:  ?how much to take ?how to take this ?when to take this ?additional instructions ?  ?Dexcom  G6 Transmitter Misc ?See admin instructions. ?  ?doxycycline 100 MG tablet ?Commonly known as: VIBRA-TABS ?Take 1 tablet (100 mg total) by mouth 2 (two) times daily for 7 days. ?  ?insulin lispro 100 UNIT/ML KwikPen ?Commonly known as: HumaLOG KwikPen ?Inject 5 Units into the skin with breakfast, with lunch, and with evening meal. ?What changed:  ?how much to take ?when to take this ?additional instructions ?  ?INSULIN SYRINGE 1CC/30GX1/2" 30G X 1/2" 1 ML Misc ?Use to draw up Lantus daily. ?  ?INSULIN SYRINGE .5CC/30GX1/2" 30G X 1/2" 0.5 ML Misc ?Use to draw up Novolog three times daily. ?  ?Lantus SoloStar 100 UNIT/ML Solostar Pen ?Generic drug: insulin glargine ?Inject 30 Units into the skin at bedtime. ?  ?sertraline 50 MG tablet ?Commonly known as: ZOLOFT ?Take 1.5 tablets (75 mg total) by mouth daily. ?  ?SUMAtriptan 25 MG tablet ?Commonly known as: Imitrex ?Take 0.5 tablets (12.5 mg total) by mouth every 2 (two) hours as needed for migraine. May repeat in 2 hours if headache persists or recurs. ?What changed:  ?how much to take ?when to take this ?additional instructions ?  ?TYLENOL PO ?Take 2 tablets by mouth every 6 (six) hours as needed (pain/fever). ?  ? ?  ? ? ?Discharge Instructions: Please refer to Patient Instructions section of EMR for full details.  Patient was counseled important signs and symptoms that should prompt return to medical care, changes in medications, dietary instructions, activity restrictions, and follow up appointments.  ? ?Follow-Up Appointments: ? Follow-up Information   ? ? [name], MD. Go on 04/14/2021.   ?Specialty: Family Medicine ?Why: @ [phone] ?Contact information: ?[address] [place] [address] ?[phone] ? ? ?  ?  ? ?  ?  ? ?  ? ? ?Donney Dice, DO ?04/11/2021, 8:51 PM ?PGY-2, [place] ? ?

## 2021-04-11 NOTE — Progress Notes (Signed)
FPTS Interim Progress Note ? ?Complaint of "burning" in chest from patient to nursing staff that was relayed to [place]. Given several runs of Vtach this AM (last run around 0830) and electrolyte derangements in setting of DKA, went to assess patient. She was sitting up at bedside reported of burning in her throat and a weird taste in her mouth. Could not further describe the taste, no presence of chest pain, just burning into the esophagus. She blamed the medications she has been on this admission.  On exam, she had sinus tachycardia with murmur/gallop/rub. EKG was obtained @1000  and showed qtc 458 with sinus tachycardia, t waves changes were seen but nonspecific in V3. All reassuring against acute cardiac cause of symptoms. Likely GERD, ordered ppi for patient and instructed her to report if she has worsening pain or it does not improve.  ? ? ? , MD ?04/11/2021, 11:01 AM ?PGY-1, [facility] ?Service pager [phone] ? ?

## 2021-04-11 NOTE — Progress Notes (Signed)
FPTS Interim Night Progress Note ? ?S:Patient sleeping comfortably.  Rounded with primary night RN.  No concerns voiced.  No orders required.   ? ?O: ?Today's Vitals  ? 04/10/21 1754 04/10/21 1935 04/10/21 2342 04/11/21 0000  ?BP: 104/72 (!) 123/95 108/72 122/78  ?Pulse: (!) 103 99 99 99  ?Resp: 16 14 15 15   ?Temp: 98.3 ?F (36.8 ?C) 98 ?F (36.7 ?C) 97.8 ?F (36.6 ?C)   ?TempSrc: Oral Oral Axillary   ?SpO2: 100% 100% 100% 99%  ?Weight:      ?Height:      ?PainSc:  0-No pain Asleep   ? ? ? ? ?A/P: ?DKA: on Endotool ?Continue current management ? ?[name] MD ?PGY-3, [facility] ?Service pager [phone]   ?

## 2021-04-12 ENCOUNTER — Other Ambulatory Visit: Payer: Self-pay | Admitting: Family Medicine

## 2021-04-12 ENCOUNTER — Encounter: Payer: Self-pay | Admitting: Family Medicine

## 2021-04-12 DIAGNOSIS — Z8669 Personal history of other diseases of the nervous system and sense organs: Secondary | ICD-10-CM

## 2021-04-12 MED ORDER — SUMATRIPTAN SUCCINATE 25 MG PO TABS: 12.5000 mg | ORAL_TABLET | ORAL | 0 refills | Status: DC | PRN

## 2021-04-14 ENCOUNTER — Other Ambulatory Visit: Payer: Self-pay

## 2021-04-14 ENCOUNTER — Inpatient Hospital Stay (HOSPITAL_COMMUNITY)
Admission: EM | Admit: 2021-04-14 | Discharge: 2021-04-16 | DRG: 638 | Disposition: A | Discharge status: Home / Self Care | Payer: Medicaid Other | Attending: Family Medicine | Admitting: Family Medicine

## 2021-04-14 ENCOUNTER — Emergency Department (HOSPITAL_COMMUNITY): Payer: Medicaid Other

## 2021-04-14 ENCOUNTER — Inpatient Hospital Stay: Payer: Self-pay

## 2021-04-14 ENCOUNTER — Encounter (HOSPITAL_COMMUNITY): Payer: Self-pay | Admitting: Emergency Medicine

## 2021-04-14 ENCOUNTER — Inpatient Hospital Stay: Payer: Medicaid Other | Admitting: Family Medicine

## 2021-04-14 DIAGNOSIS — G43909 Migraine, unspecified, not intractable, without status migrainosus: Secondary | ICD-10-CM | POA: Diagnosis present

## 2021-04-14 DIAGNOSIS — J45909 Unspecified asthma, uncomplicated: Secondary | ICD-10-CM | POA: Diagnosis present

## 2021-04-14 DIAGNOSIS — Z833 Family history of diabetes mellitus: Secondary | ICD-10-CM | POA: Diagnosis not present

## 2021-04-14 DIAGNOSIS — Z8249 Family history of ischemic heart disease and other diseases of the circulatory system: Secondary | ICD-10-CM

## 2021-04-14 DIAGNOSIS — Z20822 Contact with and (suspected) exposure to covid-19: Secondary | ICD-10-CM | POA: Diagnosis present

## 2021-04-14 DIAGNOSIS — N179 Acute kidney failure, unspecified: Secondary | ICD-10-CM | POA: Diagnosis present

## 2021-04-14 DIAGNOSIS — Z91013 Allergy to seafood: Secondary | ICD-10-CM | POA: Diagnosis not present

## 2021-04-14 DIAGNOSIS — I10 Essential (primary) hypertension: Secondary | ICD-10-CM | POA: Diagnosis present

## 2021-04-14 DIAGNOSIS — Z91410 Personal history of adult physical and sexual abuse: Secondary | ICD-10-CM | POA: Diagnosis not present

## 2021-04-14 DIAGNOSIS — L309 Dermatitis, unspecified: Secondary | ICD-10-CM | POA: Diagnosis present

## 2021-04-14 DIAGNOSIS — R739 Hyperglycemia, unspecified: Secondary | ICD-10-CM | POA: Diagnosis present

## 2021-04-14 DIAGNOSIS — F329 Major depressive disorder, single episode, unspecified: Secondary | ICD-10-CM | POA: Diagnosis present

## 2021-04-14 DIAGNOSIS — E1010 Type 1 diabetes mellitus with ketoacidosis without coma: Secondary | ICD-10-CM | POA: Diagnosis present

## 2021-04-14 DIAGNOSIS — Z794 Long term (current) use of insulin: Secondary | ICD-10-CM | POA: Diagnosis not present

## 2021-04-14 DIAGNOSIS — E1110 Type 2 diabetes mellitus with ketoacidosis without coma: Secondary | ICD-10-CM | POA: Diagnosis present

## 2021-04-14 DIAGNOSIS — E869 Volume depletion, unspecified: Secondary | ICD-10-CM | POA: Diagnosis present

## 2021-04-14 DIAGNOSIS — Z91040 Latex allergy status: Secondary | ICD-10-CM

## 2021-04-14 DIAGNOSIS — M940 Chondrocostal junction syndrome [Tietze]: Secondary | ICD-10-CM | POA: Diagnosis present

## 2021-04-14 DIAGNOSIS — Z888 Allergy status to other drugs, medicaments and biological substances status: Secondary | ICD-10-CM | POA: Diagnosis not present

## 2021-04-14 DIAGNOSIS — E1065 Type 1 diabetes mellitus with hyperglycemia: Secondary | ICD-10-CM | POA: Diagnosis not present

## 2021-04-14 DIAGNOSIS — Z79899 Other long term (current) drug therapy: Secondary | ICD-10-CM

## 2021-04-14 DIAGNOSIS — Z825 Family history of asthma and other chronic lower respiratory diseases: Secondary | ICD-10-CM

## 2021-04-14 DIAGNOSIS — R0789 Other chest pain: Secondary | ICD-10-CM | POA: Diagnosis not present

## 2021-04-14 DIAGNOSIS — E876 Hypokalemia: Secondary | ICD-10-CM | POA: Diagnosis not present

## 2021-04-14 DIAGNOSIS — E1011 Type 1 diabetes mellitus with ketoacidosis with coma: Secondary | ICD-10-CM | POA: Diagnosis not present

## 2021-04-14 LAB — CBG MONITORING, ED
Glucose-Capillary: 596 mg/dL (ref 70–99)
Glucose-Capillary: >600 mg/dL (ref 70–99)
Glucose-Capillary: >600 mg/dL (ref 70–99)
Glucose-Capillary: 555 mg/dL (ref 70–99)
Glucose-Capillary: >600 mg/dL (ref 70–99)
Glucose-Capillary: 494 mg/dL — ABNORMAL HIGH (ref 70–99)
Glucose-Capillary: 419 mg/dL — ABNORMAL HIGH (ref 70–99)
Glucose-Capillary: 440 mg/dL — ABNORMAL HIGH (ref 70–99)
Glucose-Capillary: 354 mg/dL — ABNORMAL HIGH (ref 70–99)
Glucose-Capillary: 244 mg/dL — ABNORMAL HIGH (ref 70–99)
Glucose-Capillary: 306 mg/dL — ABNORMAL HIGH (ref 70–99)
Glucose-Capillary: 226 mg/dL — ABNORMAL HIGH (ref 70–99)
Glucose-Capillary: 179 mg/dL — ABNORMAL HIGH (ref 70–99)
Glucose-Capillary: 199 mg/dL — ABNORMAL HIGH (ref 70–99)
Glucose-Capillary: 214 mg/dL — ABNORMAL HIGH (ref 70–99)

## 2021-04-14 LAB — URINALYSIS, COMPLETE (UACMP) WITH MICROSCOPIC
Bilirubin Urine: NEGATIVE
Glucose, UA: >=500 mg/dL — AB
Hgb urine dipstick: NEGATIVE
Ketones, ur: 80 mg/dL — AB
Leukocytes,Ua: NEGATIVE
Nitrite: NEGATIVE
Protein, ur: 30 mg/dL — AB
Specific Gravity, Urine: 1.02 (ref 1.005–1.030)
pH: 5 (ref 5.0–8.0)

## 2021-04-14 LAB — BLOOD GAS, ARTERIAL
Acid-base deficit: 23.7 mmol/L — ABNORMAL HIGH (ref 0.0–2.0)
Bicarbonate: 3 mmol/L — ABNORMAL LOW (ref 20.0–28.0)
Drawn by: 535471
O2 Saturation: 99.4 %
Patient temperature: 36.4
pCO2 arterial: <18 mmHg — CL (ref 32–48)
pH, Arterial: 7.14 — CL (ref 7.35–7.45)
pO2, Arterial: 126 mmHg — ABNORMAL HIGH (ref 83–108)

## 2021-04-14 LAB — BASIC METABOLIC PANEL
BUN: 29 mg/dL — ABNORMAL HIGH (ref 6–20)
BUN: 29 mg/dL — ABNORMAL HIGH (ref 6–20)
BUN: 32 mg/dL — ABNORMAL HIGH (ref 6–20)
BUN: 22 mg/dL — ABNORMAL HIGH (ref 6–20)
CO2: <7 mmol/L — ABNORMAL LOW (ref 22–32)
CO2: <7 mmol/L — ABNORMAL LOW (ref 22–32)
CO2: <7 mmol/L — ABNORMAL LOW (ref 22–32)
CO2: <7 mmol/L — ABNORMAL LOW (ref 22–32)
Calcium: 10.2 mg/dL (ref 8.9–10.3)
Calcium: 9.6 mg/dL (ref 8.9–10.3)
Calcium: 9.9 mg/dL (ref 8.9–10.3)
Calcium: 9 mg/dL (ref 8.9–10.3)
Chloride: 100 mmol/L (ref 98–111)
Chloride: 105 mmol/L (ref 98–111)
Chloride: 108 mmol/L (ref 98–111)
Chloride: 112 mmol/L — ABNORMAL HIGH (ref 98–111)
Creatinine, Ser: 1.7 mg/dL — ABNORMAL HIGH (ref 0.44–1.00)
Creatinine, Ser: 1.41 mg/dL — ABNORMAL HIGH (ref 0.44–1.00)
Creatinine, Ser: 1.44 mg/dL — ABNORMAL HIGH (ref 0.44–1.00)
Creatinine, Ser: 1.11 mg/dL — ABNORMAL HIGH (ref 0.44–1.00)
GFR, Estimated: 44 mL/min — ABNORMAL LOW (ref >60)
GFR, Estimated: 55 mL/min — ABNORMAL LOW (ref >60)
GFR, Estimated: 54 mL/min — ABNORMAL LOW (ref >60)
GFR, Estimated: >60 mL/min (ref >60)
Glucose, Bld: 750 mg/dL (ref 70–99)
Glucose, Bld: 749 mg/dL (ref 70–99)
Glucose, Bld: 603 mg/dL (ref 70–99)
Glucose, Bld: 247 mg/dL — ABNORMAL HIGH (ref 70–99)
Potassium: 5.6 mmol/L — ABNORMAL HIGH (ref 3.5–5.1)
Potassium: 5.7 mmol/L — ABNORMAL HIGH (ref 3.5–5.1)
Potassium: 5.7 mmol/L — ABNORMAL HIGH (ref 3.5–5.1)
Potassium: 3.8 mmol/L (ref 3.5–5.1)
Sodium: 134 mmol/L — ABNORMAL LOW (ref 135–145)
Sodium: 136 mmol/L (ref 135–145)
Sodium: 138 mmol/L (ref 135–145)
Sodium: 136 mmol/L (ref 135–145)

## 2021-04-14 LAB — RAPID URINE DRUG SCREEN, HOSP PERFORMED
Amphetamines: NOT DETECTED
Barbiturates: NOT DETECTED
Benzodiazepines: NOT DETECTED
Cocaine: NOT DETECTED
Opiates: NOT DETECTED
Tetrahydrocannabinol: NOT DETECTED

## 2021-04-14 LAB — CBC WITH DIFFERENTIAL/PLATELET
Abs Immature Granulocytes: 0.06 10*3/uL (ref 0.00–0.07)
Basophils Absolute: 0.1 10*3/uL (ref 0.0–0.1)
Basophils Relative: 1 %
Eosinophils Absolute: 0 10*3/uL (ref 0.0–0.5)
Eosinophils Relative: 0 %
HCT: 46.9 % — ABNORMAL HIGH (ref 36.0–46.0)
Hemoglobin: 13.8 g/dL (ref 12.0–15.0)
Immature Granulocytes: 0 %
Lymphocytes Relative: 26 %
Lymphs Abs: 3.9 10*3/uL (ref 0.7–4.0)
MCH: 28.5 pg (ref 26.0–34.0)
MCHC: 29.4 g/dL — ABNORMAL LOW (ref 30.0–36.0)
MCV: 96.7 fL (ref 80.0–100.0)
Monocytes Absolute: 0.5 10*3/uL (ref 0.1–1.0)
Monocytes Relative: 3 %
Neutro Abs: 10.4 10*3/uL — ABNORMAL HIGH (ref 1.7–7.7)
Neutrophils Relative %: 70 %
Platelets: 410 10*3/uL — ABNORMAL HIGH (ref 150–400)
RBC: 4.85 MIL/uL (ref 3.87–5.11)
RDW: 13.2 % (ref 11.5–15.5)
WBC: 15 10*3/uL — ABNORMAL HIGH (ref 4.0–10.5)
nRBC: 0 % (ref 0.0–0.2)

## 2021-04-14 LAB — I-STAT VENOUS BLOOD GAS, ED
Acid-base deficit: 27 mmol/L — ABNORMAL HIGH (ref 0.0–2.0)
Acid-base deficit: 30 mmol/L — ABNORMAL HIGH (ref 0.0–2.0)
Bicarbonate: 3.3 mmol/L — ABNORMAL LOW (ref 20.0–28.0)
Bicarbonate: 3.4 mmol/L — ABNORMAL LOW (ref 20.0–28.0)
Calcium, Ion: 1.29 mmol/L (ref 1.15–1.40)
Calcium, Ion: 1.3 mmol/L (ref 1.15–1.40)
HCT: 48 % — ABNORMAL HIGH (ref 36.0–46.0)
HCT: 46 % (ref 36.0–46.0)
Hemoglobin: 16.3 g/dL — ABNORMAL HIGH (ref 12.0–15.0)
Hemoglobin: 15.6 g/dL — ABNORMAL HIGH (ref 12.0–15.0)
O2 Saturation: 96 %
O2 Saturation: 60 %
Potassium: 4.9 mmol/L (ref 3.5–5.1)
Potassium: 5.5 mmol/L — ABNORMAL HIGH (ref 3.5–5.1)
Sodium: 134 mmol/L — ABNORMAL LOW (ref 135–145)
Sodium: 138 mmol/L (ref 135–145)
TCO2: <5 mmol/L — ABNORMAL LOW (ref 22–32)
TCO2: <5 mmol/L — ABNORMAL LOW (ref 22–32)
pCO2, Ven: 15.2 mmHg — CL (ref 44–60)
pCO2, Ven: 21.1 mmHg — ABNORMAL LOW (ref 44–60)
pH, Ven: 6.948 — CL (ref 7.25–7.43)
pH, Ven: 6.817 — CL (ref 7.25–7.43)
pO2, Ven: 127 mmHg — ABNORMAL HIGH (ref 32–45)
pO2, Ven: 55 mmHg — ABNORMAL HIGH (ref 32–45)

## 2021-04-14 LAB — BETA-HYDROXYBUTYRIC ACID
Beta-Hydroxybutyric Acid: >8 mmol/L — ABNORMAL HIGH (ref 0.05–0.27)
Beta-Hydroxybutyric Acid: >8 mmol/L — ABNORMAL HIGH (ref 0.05–0.27)
Beta-Hydroxybutyric Acid: 7.42 mmol/L — ABNORMAL HIGH (ref 0.05–0.27)

## 2021-04-14 LAB — TROPONIN I (HIGH SENSITIVITY)
Troponin I (High Sensitivity): 16 ng/L (ref <18)
Troponin I (High Sensitivity): 14 ng/L (ref <18)

## 2021-04-14 LAB — I-STAT BETA HCG BLOOD, ED (MC, WL, AP ONLY): I-stat hCG, quantitative: <5 m[IU]/mL (ref <5)

## 2021-04-14 LAB — OSMOLALITY: Osmolality: 358 mOsm/kg (ref 275–295)

## 2021-04-14 LAB — GLUCOSE, CAPILLARY: Glucose-Capillary: 174 mg/dL — ABNORMAL HIGH (ref 70–99)

## 2021-04-14 LAB — ETHANOL: Alcohol, Ethyl (B): <10 mg/dL (ref <10)

## 2021-04-14 LAB — MAGNESIUM
Magnesium: 3 mg/dL — ABNORMAL HIGH (ref 1.7–2.4)
Magnesium: 2.8 mg/dL — ABNORMAL HIGH (ref 1.7–2.4)

## 2021-04-14 LAB — MRSA NEXT GEN BY PCR, NASAL: MRSA by PCR Next Gen: NOT DETECTED

## 2021-04-14 IMAGING — DX DG CHEST 1V PORT
1 series · 1 of 1 positions shown · non-contrast
Comparison: Portable chest [DATE] and earlier.

CLINICAL DATA: 20-year-old female with hyperglycemia and shortness
of breath.

EXAM:
PORTABLE CHEST 1 VIEW

[chest]
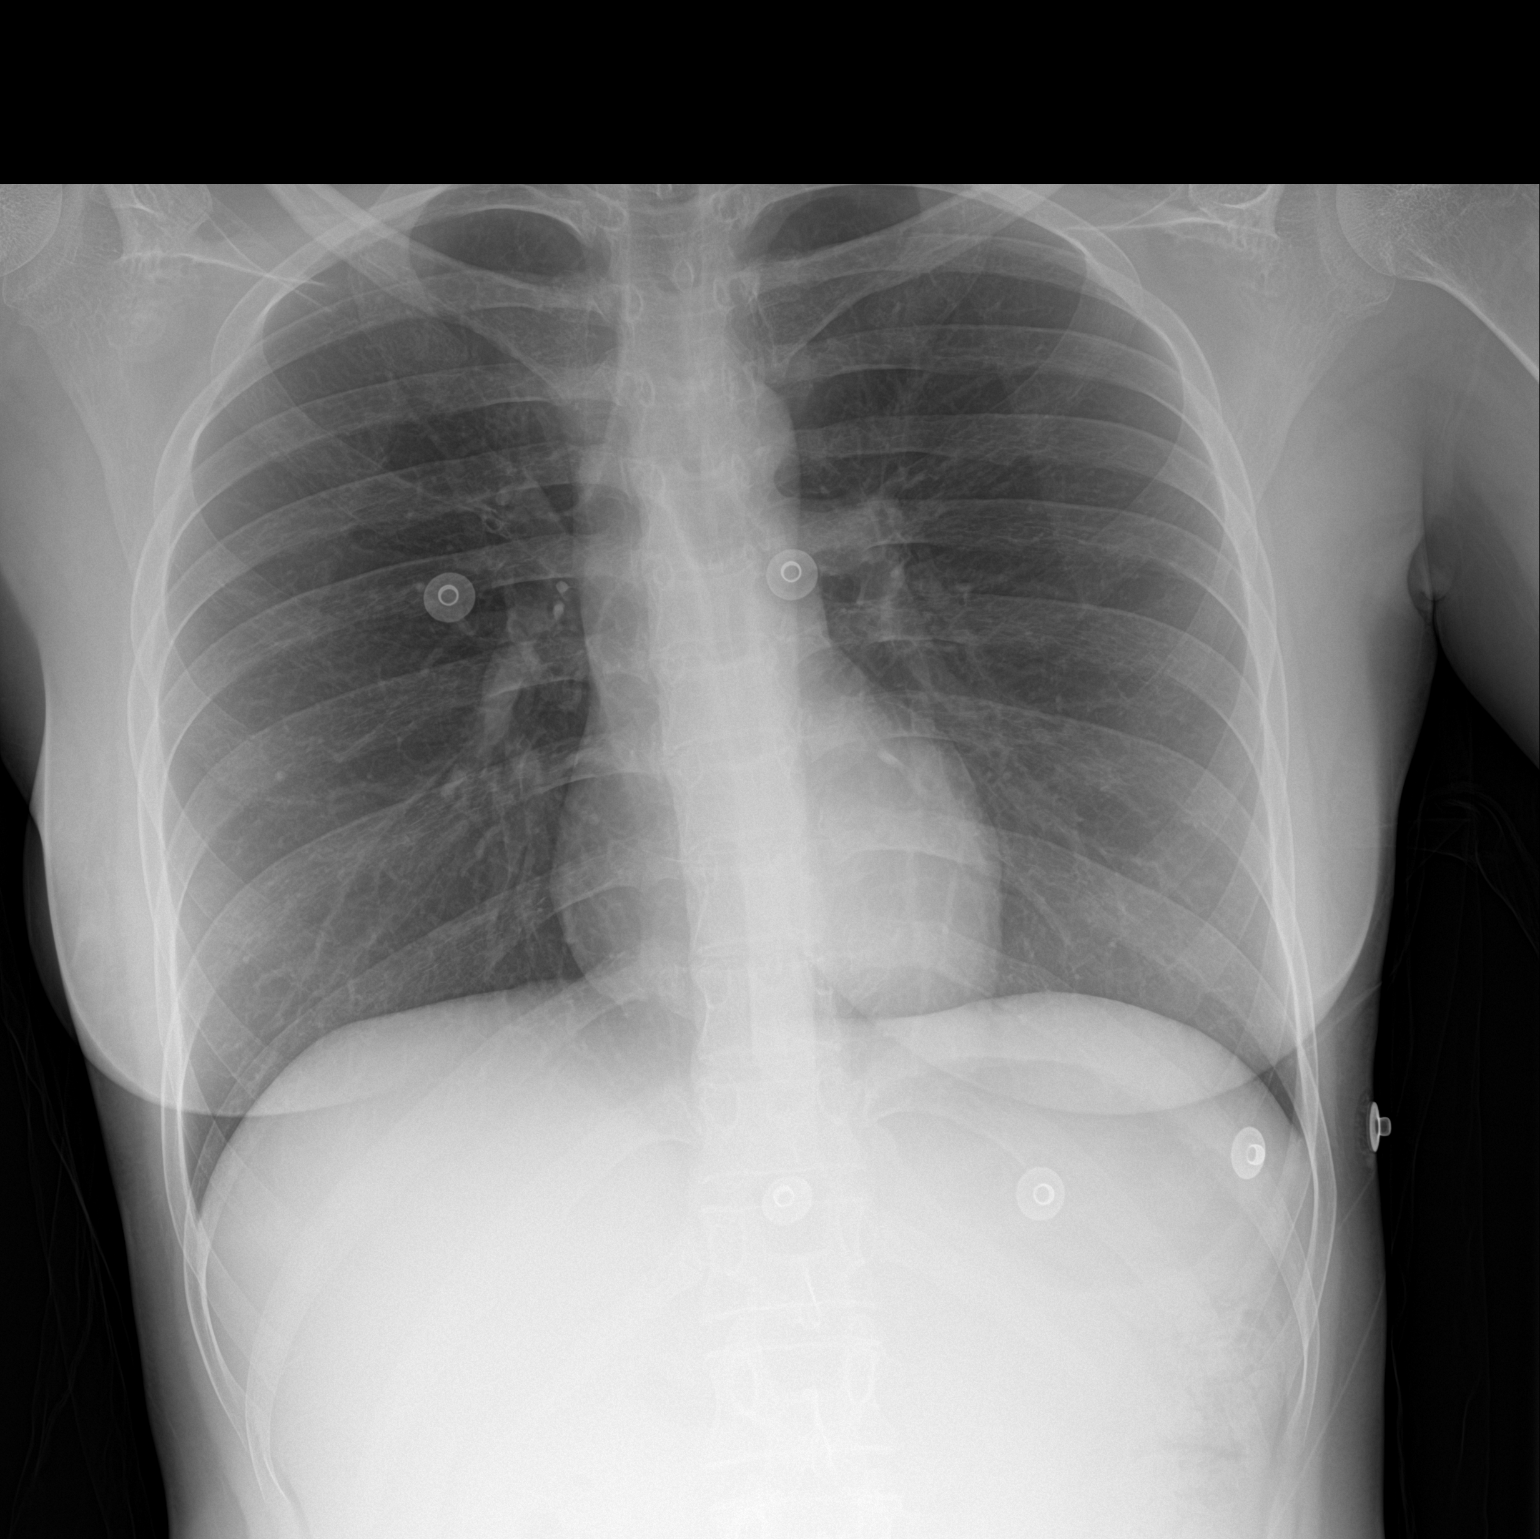

[1 of 1 positions shown; findings below may reference images not displayed]

FINDINGS: Portable AP upright view at [DW] hours. Right PICC line removed.
Larger lung volumes. Allowing for portable technique the lungs are
clear. Normal cardiac size and mediastinal contours. Visualized
tracheal air column is within normal limits. T1 spina bifida occulta
(normal variant). No osseous abnormality identified. Paucity of
bowel gas in the upper abdomen.
IMPRESSION: Negative portable chest.

## 2021-04-14 MED ORDER — ONDANSETRON 4 MG PO TBDP
4.0000 mg | ORAL_TABLET | Freq: Three times a day (TID) | ORAL | Status: DC | PRN
  Filled 2021-04-14: qty 1

## 2021-04-14 MED ORDER — DEXTROSE IN LACTATED RINGERS 5 % IV SOLN: INTRAVENOUS | Status: DC

## 2021-04-14 MED ORDER — POTASSIUM CHLORIDE 10 MEQ/100ML IV SOLN
10.0000 meq | INTRAVENOUS | Status: DC
  Filled 2021-04-14 (×2): qty 100

## 2021-04-14 MED ORDER — SODIUM CHLORIDE 0.9 % IV BOLUS: 1000.0000 mL | INTRAVENOUS | Status: DC

## 2021-04-14 MED ORDER — LACTATED RINGERS IV BOLUS: 20.0000 mL/kg | Freq: Once | INTRAVENOUS | Status: DC

## 2021-04-14 MED ORDER — INSULIN REGULAR(HUMAN) IN NACL 100-0.9 UT/100ML-% IV SOLN
INTRAVENOUS | Status: DC
  Administered 2021-04-14: 4.6 [IU]/h via INTRAVENOUS
  Administered 2021-04-15: 2.6 [IU]/h via INTRAVENOUS
  Filled 2021-04-14: qty 100

## 2021-04-14 MED ORDER — LACTATED RINGERS IV SOLN: INTRAVENOUS | Status: DC

## 2021-04-14 MED ORDER — DEXTROSE 50 % IV SOLN: 0.0000 mL | INTRAVENOUS | Status: DC | PRN

## 2021-04-14 MED ORDER — STERILE WATER FOR INJECTION IV SOLN
INTRAVENOUS | Status: DC
  Filled 2021-04-14: qty 1000

## 2021-04-14 MED ORDER — CHLORHEXIDINE GLUCONATE CLOTH 2 % EX PADS: 6.0000 | MEDICATED_PAD | Freq: Every day | CUTANEOUS | Status: DC

## 2021-04-14 MED ORDER — LACTATED RINGERS IV BOLUS: 2000.0000 mL | Freq: Once | INTRAVENOUS | Status: DC

## 2021-04-14 MED ORDER — INSULIN REGULAR(HUMAN) IN NACL 100-0.9 UT/100ML-% IV SOLN
INTRAVENOUS | Status: DC
  Administered 2021-04-14: 5 [IU]/h via INTRAVENOUS
  Filled 2021-04-14 (×2): qty 100

## 2021-04-14 MED ORDER — SODIUM CHLORIDE 0.9% FLUSH
10.0000 mL | Freq: Two times a day (BID) | INTRAVENOUS | Status: DC
  Administered 2021-04-14 – 2021-04-16 (×4): 10 mL

## 2021-04-14 MED ORDER — LACTATED RINGERS IV BOLUS
2000.0000 mL | Freq: Once | INTRAVENOUS | Status: AC
  Administered 2021-04-14: 1000 mL via INTRAVENOUS

## 2021-04-14 MED ORDER — SODIUM CHLORIDE 0.9% FLUSH: 10.0000 mL | INTRAVENOUS | Status: DC | PRN

## 2021-04-14 MED ORDER — ENOXAPARIN SODIUM 40 MG/0.4ML IJ SOSY: 40.0000 mg | PREFILLED_SYRINGE | INTRAMUSCULAR | Status: DC

## 2021-04-14 MED ORDER — POTASSIUM CHLORIDE 10 MEQ/100ML IV SOLN: 10.0000 meq | INTRAVENOUS | Status: DC

## 2021-04-14 MED ORDER — SUMATRIPTAN SUCCINATE 6 MG/0.5ML ~~LOC~~ SOLN
6.0000 mg | Freq: Once | SUBCUTANEOUS | Status: AC
  Administered 2021-04-14: 6 mg via SUBCUTANEOUS
  Filled 2021-04-14: qty 0.5

## 2021-04-14 NOTE — ED Notes (Signed)
Josh, PA is aware of abnormal lab results.  ?
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
## 2021-04-14 NOTE — Discharge Planning (Signed)
Eli, RNCM from [facility] called regarding disposition needs and update on pt status.  [phone]. ?

## 2021-04-14 NOTE — ED Provider Notes (Signed)
?[facility] ?Provider Note ? ? ?CSN: [id number] ?Arrival date & time: 04/14/21  8850 ? ?  ? ?History ? ?Chief Complaint  ?Patient presents with  ? Hyperglycemia  ? ? ?Dawn [name] is a 21 y.o. female. ? ? ?Hyperglycemia ?Associated symptoms: vomiting   ?Patient presents with hyperglycemia.  Comes by EMS.  History of DKA and was actually just discharged 3 days ago for DKA admission.  At that time had a venous pH of 6.9.  Reportedly has urinary frequency.  Has vomited twice.  Able to answer some questions but is breathing quickly and appears mildly somnolent. ?  ? ?Home Medications ?Prior to Admission medications   ?Medication Sig Start Date End Date Taking? Authorizing Provider  ?Accu-Chek Softclix Lancets lancets Please use to check blood sugar up to 6 times/day. 04/11/21   Erskine Emery, MD  ?Acetaminophen (TYLENOL PO) Take 2 tablets by mouth every 6 (six) hours as needed (pain/fever).    [provider]  ?albuterol (PROVENTIL) (2.5 MG/3ML) 0.083% nebulizer solution Take 2.5 mg by nebulization every 6 (six) hours as needed for wheezing or shortness of breath.    [provider]  ?albuterol (VENTOLIN HFA) 108 (90 Base) MCG/ACT inhaler Inhale 2 puffs into the lungs every 6 (six) hours as needed for wheezing or shortness of breath. 03/24/21   [name], MD  ?Blood Glucose Monitoring Suppl (ACCU-CHEK GUIDE) w/Device KIT 1 kit by Does not apply route daily as needed. 04/11/21   Erskine Emery, MD  ?Continuous Blood Gluc Sensor (DEXCOM G6 SENSOR) MISC 1 Units by Does not apply route continuous. 04/11/21   Erskine Emery, MD  ?Continuous Blood Gluc Transmit (DEXCOM G6 TRANSMITTER) MISC See admin instructions. 07/22/20   [provider]  ?doxycycline (VIBRA-TABS) 100 MG tablet Take 1 tablet (100 mg total) by mouth 2 (two) times daily for 7 days. 04/07/21 04/14/21  [name], MD  ?Glucagon (BAQSIMI ONE PACK) 3 MG/DOSE POWD Place 1 Dose into the nose once as  needed (low blood sugar).    [provider]  ?glucose blood (ACCU-CHEK GUIDE) test strip Use to check glucose 6x daily 04/11/21   Erskine Emery, MD  ?insulin glargine (LANTUS SOLOSTAR) 100 UNIT/ML Solostar Pen Inject 30 Units into the skin at bedtime. 03/11/21   Dameron, Luna Fuse, DO  ?insulin lispro (HUMALOG KWIKPEN) 100 UNIT/ML KwikPen Inject 5 Units into the skin with breakfast, with lunch, and with evening meal. ?Patient taking differently: Inject 10-20 Units into the skin See admin instructions. Inject 10-20 units subcutaneously with meals - per sliding scale based on carb count 03/11/21 04/11/21  Orvis Brill, DO  ?Insulin Pen Needle (BD PEN NEEDLE NANO 2ND GEN) 32G X 4 MM MISC USE TO INJECT INSULIN 6 TIMES DAILY 04/11/21   Erskine Emery, MD  ?Insulin Syringe-Needle U-100 (INSULIN SYRINGE .5CC/30GX1/2") 30G X 1/2" 0.5 ML MISC Use to draw up Novolog three times daily. 06/08/20   Sharion Settler, DO  ?Insulin Syringe-Needle U-100 (INSULIN SYRINGE 1CC/30GX1/2") 30G X 1/2" 1 ML MISC Use to draw up Lantus daily. 06/08/20   Sharion Settler, DO  ?sertraline (ZOLOFT) 50 MG tablet Take 1.5 tablets (75 mg total) by mouth daily. 03/11/21   Dameron, Luna Fuse, DO  ?SUMAtriptan (IMITREX) 25 MG tablet Take 0.5 tablets (12.5 mg total) by mouth every 2 (two) hours as needed for migraine. May repeat in 2 hours if headache persists or recurs. 04/12/21   [name], MD  ?   ? ?Allergies    ?Latex,  Tape, and Shellfish allergy   ? ?Review of Systems   ?Review of Systems  ?Gastrointestinal:  Positive for vomiting.  ? ?Physical Exam ?Updated Vital Signs ?BP (!) 134/107 (BP Location: Left Arm)   Pulse (!) 105   Temp (!) 97.5 ?F (36.4 ?C) (Oral)   Resp 20   LMP 03/28/2021 (Approximate)   SpO2 96%  ?Physical Exam ?Vitals reviewed.  ?HENT:  ?   Mouth/Throat:  ?   Mouth: Mucous membranes are dry.  ?Eyes:  ?   General: No scleral icterus. ?Cardiovascular:  ?   Comments: Mild tachycardia ?Abdominal:  ?   Tenderness: There is no  abdominal tenderness.  ?Musculoskeletal:  ?   Right lower leg: No edema.  ?   Left lower leg: No edema.  ?Skin: ?   General: Skin is warm.  ?Neurological:  ?   Comments: Awake and answers questions but mildly slow to answer.  ? ? ?ED Results / Procedures / Treatments   ?Labs ?(all labs ordered are listed, but only abnormal results are displayed) ?Labs Reviewed  ?BASIC METABOLIC PANEL - Abnormal; Notable for the following components:  ?    Result Value  ? Sodium 134 (*)   ? Potassium 5.6 (*)   ? CO2 <7 (*)   ? Glucose, Bld 750 (*)   ? BUN 29 (*)   ? Creatinine, Ser 1.70 (*)   ? GFR, Estimated 44 (*)   ? All other components within normal limits  ?CBC WITH DIFFERENTIAL/PLATELET - Abnormal; Notable for the following components:  ? WBC 15.0 (*)   ? HCT 46.9 (*)   ? MCHC 29.4 (*)   ? Platelets 410 (*)   ? Neutro Abs 10.4 (*)   ? All other components within normal limits  ?BETA-HYDROXYBUTYRIC ACID - Abnormal; Notable for the following components:  ? Beta-Hydroxybutyric Acid >8.00 (*)   ? All other components within normal limits  ?OSMOLALITY - Abnormal; Notable for the following components:  ? Osmolality 358 (*)   ? All other components within normal limits  ?MAGNESIUM - Abnormal; Notable for the following components:  ? Magnesium 3.0 (*)   ? All other components within normal limits  ?BASIC METABOLIC PANEL - Abnormal; Notable for the following components:  ? Potassium 5.7 (*)   ? CO2 <7 (*)   ? Glucose, Bld 749 (*)   ? BUN 29 (*)   ? Creatinine, Ser 1.41 (*)   ? GFR, Estimated 55 (*)   ? All other components within normal limits  ?BASIC METABOLIC PANEL - Abnormal; Notable for the following components:  ? Potassium 5.7 (*)   ? CO2 <7 (*)   ? Glucose, Bld 603 (*)   ? BUN 32 (*)   ? Creatinine, Ser 1.44 (*)   ? GFR, Estimated 54 (*)   ? All other components within normal limits  ?BETA-HYDROXYBUTYRIC ACID - Abnormal; Notable for the following components:  ? Beta-Hydroxybutyric Acid >8.00 (*)   ? All other components within  normal limits  ?MAGNESIUM - Abnormal; Notable for the following components:  ? Magnesium 2.8 (*)   ? All other components within normal limits  ?CBG MONITORING, ED - Abnormal; Notable for the following components:  ? Glucose-Capillary 596 (*)   ? All other components within normal limits  ?I-STAT VENOUS BLOOD GAS, ED - Abnormal; Notable for the following components:  ? pH, Ven 6.948 (*)   ? pCO2, Ven 15.2 (*)   ? pO2, Ven 127 (*)   ?  Bicarbonate 3.3 (*)   ? TCO2 <5 (*)   ? Acid-base deficit 27.0 (*)   ? Sodium 134 (*)   ? HCT 48.0 (*)   ? Hemoglobin 16.3 (*)   ? All other components within normal limits  ?CBG MONITORING, ED - Abnormal; Notable for the following components:  ? Glucose-Capillary >600 (*)   ? All other components within normal limits  ?CBG MONITORING, ED - Abnormal; Notable for the following components:  ? Glucose-Capillary >600 (*)   ? All other components within normal limits  ?CBG MONITORING, ED - Abnormal; Notable for the following components:  ? Glucose-Capillary >600 (*)   ? All other components within normal limits  ?CBG MONITORING, ED - Abnormal; Notable for the following components:  ? Glucose-Capillary 555 (*)   ? All other components within normal limits  ?I-STAT VENOUS BLOOD GAS, ED - Abnormal; Notable for the following components:  ? pH, Ven 6.817 (*)   ? pCO2, Ven 21.1 (*)   ? pO2, Ven 55 (*)   ? Bicarbonate 3.4 (*)   ? TCO2 <5 (*)   ? Acid-base deficit 30.0 (*)   ? Potassium 5.5 (*)   ? Hemoglobin 15.6 (*)   ? All other components within normal limits  ?CBG MONITORING, ED - Abnormal; Notable for the following components:  ? Glucose-Capillary 494 (*)   ? All other components within normal limits  ?CBG MONITORING, ED - Abnormal; Notable for the following components:  ? Glucose-Capillary 440 (*)   ? All other components within normal limits  ?CBG MONITORING, ED - Abnormal; Notable for the following components:  ? Glucose-Capillary 419 (*)   ? All other components within normal limits  ?CBG  MONITORING, ED - Abnormal; Notable for the following components:  ? Glucose-Capillary 354 (*)   ? All other components within normal limits  ?ETHANOL  ?RAPID URINE DRUG SCREEN, HOSP PERFORMED  ?URINALYSIS, COMPLETE

## 2021-04-14 NOTE — Progress Notes (Signed)
Interim Progress Note ? ?Repeat VBG with worsening pH at 6.817 from 6.948. In to see patient with Dr. Oleh Genin. Kussmaul breathing very apparent. She is able to intermittently follow commands. Her significant other is at baseline. She is able to talk in short phrases and appears acutely distressed. ? ?Given her worsened VBG- paged PCCM who will come to evaluate the patient.  ? ?Continue endotool and will f/u with PCCM recommendations.  ? ?

## 2021-04-14 NOTE — Progress Notes (Signed)
FPTS Interim Progress Note ? ?Spoke to PCCM NP [name], they decided to take Dawn [name] to ICU overnight after evaluation. Appears that the patient had not been on fluids with insulin in the emergency room to cause her worsening status. Will pick her up in the morning on our service. We appreciate the assistance from critical care.  ? ?Erskine Emery, MD ?04/14/2021, 1:41 PM ?PGY-1, [facility] ?Service pager [phone] ? ?

## 2021-04-14 NOTE — Progress Notes (Signed)
PICC RN, Vernona Rieger and IVT RN, Hospital doctor spoke with Primary RN, Reba regarding pt's vasculature.  Pt had PICC line placed 04/10/21 @1345  and pulled on 04/11/21 at 2200, w/in 24hrs.  Pt was assessed by IVT this morning and an IV was placed in the only vein found.  Recommendation was to consider PICC if being admitted.  RN, Reba to message MD and place consult based on discussion. ?

## 2021-04-14 NOTE — ED Notes (Signed)
Report called to Sarah, RN

## 2021-04-14 NOTE — Progress Notes (Signed)
PCCM INTERVAL PROGRESS NOTE ? ? ?Briefly evaluated patient in [facility] ED. She is spontaneously awake and alert. No complaints. Follow up ABG improved. pH 6.8 >7.14  BMP pending. ? ? ?Joneen Roach, AGACNP-BC ?[facility] ? ?See Amion for personal pager ?PCCM on call pager [phone] until 7pm. ?Please call Elink 7p-7a. [phone] ? ?04/14/2021 7:32 PM ? ? ? ? ?
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
## 2021-04-14 NOTE — ED Triage Notes (Signed)
Patient from home, patient with high reading of CBG.  Patient Is a type I diabetic.  Patient with kussmaul respirations, tachycardic upon arrival.  Extensive hx of DKA admissions.   ?

## 2021-04-14 NOTE — Hospital Course (Addendum)
Dawn [name] is a 21 y.o. female who was admitted to [facility] Medicine Inpatient Service for acute onset hyperglycemia with labs consistent with DKA. Hospital course is outlined below.   ? ?T1DM  DKA ?In the ED, labs were consistent with DKA. Patient's initial labs were as followed: pH 6.9, glucose 750, CO2 <7, AG 27 beta-hydroxybutyrate >8 with large/moderate ketones in the urine.  She was started on Endo tool. She was then transferred to the ICU as she had worsened clinically without fluids while in the ED and worsening mental status during first day of admission. IV fluids were stopped once anion gap was cleared x2 and patient demonstrated ability to have p.o. intake. Patient was transferred back to [facility] after she was more stabilized.  At the time of discharge the patient had an understanding of their home insulin regimen and all medications were discussed with the patient prior to discharge.  ? ?AKI ?Serum creatinine elevated to 1.7.  This was suspected to be prerenal given DKA and dehydration.  Creatinine improved to 0.61.  ? ?Hypokalemia ?Potassium was 2.8 after discontinued Endo tool, so patient was appropriately repleted with Klor-Con. ? ?Other chronic conditions were medically managed with home medications and formulary alternatives as necessary (asthma, MDD, migraines) ? ?PCP Follow-up Recommendations: ?-Assess compliance with insulin regiment ?-Depo at end of month ?-Ensure partner treatment for prior STD ?

## 2021-04-14 NOTE — Progress Notes (Signed)
Inpatient Diabetes Program Recommendations ? ?AACE/ADA: New Consensus Statement on Inpatient Glycemic Control (2015) ? ?Target Ranges:  Prepandial:   less than 140 mg/dL ?     Peak postprandial:   less than 180 mg/dL (1-2 hours) ?     Critically ill patients:  140 - 180 mg/dL  ? ?Lab Results  ?Component Value Date  ? GLUCAP 244 (H) 04/14/2021  ? HGBA1C 14.6 (H) 04/10/2021  ? ? ?Review of Glycemic Control ? ?Diabetes history: DM type 1 ?Outpatient Diabetes medications: Dexcom, Lantus 30 units qhs, Humalog 10-20 units before meals based on carb count ?Current orders for Inpatient glycemic control:  ?IV insulin/Endotool/Severe DKA ? ?A1c 14.6 on 4/8 ?Endocrinologist Dr Hartford Poli ?Pt has called several times to have appointment to learn about her new insulin pump outpt. Pt has had recent education by CDE on diet on 2/22. Was given a new glucometer at that time. ? ?Spoke with pt at bedside. Pt was breathing rapidly due to acidosis. Pt had complaints of being cold and thirsty. Obtained warmed blankets and diet gingerale for pt. Pt in severe acidosis at this time. Pt with follow up with CDE for pump training and Endocrinology follow up after hospitalization. ? ?Thanks, ? ?Tama Headings RN, MSN, BC-ADM ?Inpatient Diabetes Coordinator ?Team Pager [phone] (8a-5p) ?

## 2021-04-14 NOTE — ED Notes (Signed)
Insulin and d5LR switch to picc line. New tubing used. IV removed.  ?

## 2021-04-14 NOTE — Progress Notes (Signed)
eLink Physician-Brief Progress Note ?Patient Name: Dawn [name] ?DOB: 06/15/2000 ?MRN: [id number] ? ? ?Date of Service ? 04/14/2021  ?HPI/Events of Note ? Patient c/o headache. History of migraine headache. NPO.  ?eICU Interventions ? Plan: ?Imitrex 6 mg Clare X 1 now.   ? ? ? ?Intervention Category ?Major Interventions: Other: ? ?Nyoka Alcoser Dennard Nip ?04/14/2021, 10:05 PM ?

## 2021-04-14 NOTE — Progress Notes (Signed)
Dear Doctor: Owens Shark, ? This patient has been identified as a candidate for PICC for the following reason (s): poor veins/poor circulatory system (CHF, COPD, emphysema, diabetes, steroid use, IV drug abuse, etc.) If you agree, please write an order for the indicated device. For any questions contact the Vascular Access Team at [phone] if no answer, please leave a message. ? ?Thank you for supporting the early vascular access assessment program. ? ? ? ?

## 2021-04-14 NOTE — ED Provider Triage Note (Signed)
Emergency Medicine Provider Triage Evaluation Note ? ?[name] , a 21 y.o. female  was evaluated in triage.  Pt  presents via EMS with concern for likely DKA.  Patient short of breath, altered mental status, hyperglycemic with sugars undetectably high on field CBG.  Tachypneic with Kussmaul breathing, answering only yes or no questions with head nod, reportedly losing consciousness with attempts to speak.  History of the same. Denies missed doses of insulin in the past.  ? ?Review of Systems  ?Positive:  ?Negative:  ? ?Unable to obtain ROS due to AMS.  ? ?Physical Exam  ?BP (!) 142/99 (BP Location: Right Arm)   Pulse 99   Temp (!) 97.5 ?F (36.4 ?C) (Oral)   Resp (!) 21   LMP 03/28/2021 (Approximate)   SpO2 100%  ?Gen:   Awake, GCS  ?Resp:  Kussmaul breathing here, lungs CTAB ?MSK:   Moves extremities without difficulty  ?Other:  GCS 13, patient very ill-appearing NRS ? ?Medical Decision Making  ?Medically screening exam initiated at 6:47 AM.  Appropriate orders placed.  Verne Cove was informed that the remainder of the evaluation will be completed by another provider, this initial triage assessment does not replace that evaluation, and the importance of remaining in the ED until their evaluation is complete. ? ?Charge nurse attempting to secure room for patient. Capnography reading 5 with EMS, RR of >40. Patient to be next. ? ?This chart was dictated using voice recognition software, Dragon. Despite the best efforts of this provider to proofread and correct errors, errors may still occur which can change documentation meaning. ? ?  ?Paris Lore, PA-C ?04/14/21 9983 ? ?

## 2021-04-14 NOTE — Progress Notes (Signed)
Peripherally Inserted Central Catheter Placement ? ?The IV Nurse has discussed with the patient and/or persons authorized to consent for the patient, the purpose of this procedure and the potential benefits and risks involved with this procedure.  The benefits include less needle sticks, lab draws from the catheter, and the patient may be discharged home with the catheter. Risks include, but not limited to, infection, bleeding, blood clot (thrombus formation), and puncture of an artery; nerve damage and irregular heartbeat and possibility to perform a PICC exchange if needed/ordered by physician.  Alternatives to this procedure were also discussed.  Bard Power PICC patient education guide, fact sheet on infection prevention and patient information card has been provided to patient /or left at bedside.   ? ?PICC Placement Documentation  ?PICC Double Lumen 04/14/21 Right Brachial 31 cm 0 cm (Active)  ?Indication for Insertion or Continuance of Line Limited venous access - need for IV therapy >5 days (PICC only) 04/14/21 1815  ?Exposed Catheter (cm) 0 cm 04/14/21 1815  ?Site Assessment Clean, Dry, Intact 04/14/21 1815  ?Lumen #1 Status Flushed;Saline locked;Blood return noted 04/14/21 1815  ?Lumen #2 Status Flushed;Saline locked;Blood return noted 04/14/21 1815  ?Dressing Type Transparent;Securing device 04/14/21 1815  ?Dressing Status Antimicrobial disc in place 04/14/21 1815  ?Dressing Intervention New dressing;Other (Comment) 04/14/21 1815  ?Dressing Change Due 04/21/21 04/14/21 1815  ? ? ? ? ? ?Reginia Forts Albarece ?04/14/2021, 6:17 PM ? ?

## 2021-04-15 DIAGNOSIS — E1010 Type 1 diabetes mellitus with ketoacidosis without coma: Secondary | ICD-10-CM | POA: Diagnosis not present

## 2021-04-15 DIAGNOSIS — E1011 Type 1 diabetes mellitus with ketoacidosis with coma: Secondary | ICD-10-CM | POA: Diagnosis not present

## 2021-04-15 LAB — BASIC METABOLIC PANEL
Anion gap: 10 (ref 5–15)
Anion gap: 10 (ref 5–15)
Anion gap: 12 (ref 5–15)
Anion gap: 6 (ref 5–15)
BUN: 11 mg/dL (ref 6–20)
BUN: 11 mg/dL (ref 6–20)
BUN: 16 mg/dL (ref 6–20)
BUN: 9 mg/dL (ref 6–20)
CO2: 11 mmol/L — ABNORMAL LOW (ref 22–32)
CO2: 15 mmol/L — ABNORMAL LOW (ref 22–32)
CO2: 17 mmol/L — ABNORMAL LOW (ref 22–32)
CO2: 8 mmol/L — ABNORMAL LOW (ref 22–32)
Calcium: 8.3 mg/dL — ABNORMAL LOW (ref 8.9–10.3)
Calcium: 8.3 mg/dL — ABNORMAL LOW (ref 8.9–10.3)
Calcium: 8.7 mg/dL — ABNORMAL LOW (ref 8.9–10.3)
Calcium: 9 mg/dL (ref 8.9–10.3)
Chloride: 110 mmol/L (ref 98–111)
Chloride: 114 mmol/L — ABNORMAL HIGH (ref 98–111)
Chloride: 115 mmol/L — ABNORMAL HIGH (ref 98–111)
Chloride: 115 mmol/L — ABNORMAL HIGH (ref 98–111)
Creatinine, Ser: 0.61 mg/dL (ref 0.44–1.00)
Creatinine, Ser: 0.63 mg/dL (ref 0.44–1.00)
Creatinine, Ser: 0.75 mg/dL (ref 0.44–1.00)
Creatinine, Ser: 0.85 mg/dL (ref 0.44–1.00)
GFR, Estimated: >60 mL/min (ref >60)
GFR, Estimated: >60 mL/min (ref >60)
GFR, Estimated: >60 mL/min (ref >60)
GFR, Estimated: >60 mL/min (ref >60)
Glucose, Bld: 184 mg/dL — ABNORMAL HIGH (ref 70–99)
Glucose, Bld: 317 mg/dL — ABNORMAL HIGH (ref 70–99)
Glucose, Bld: 498 mg/dL — ABNORMAL HIGH (ref 70–99)
Glucose, Bld: 604 mg/dL (ref 70–99)
Potassium: 3.5 mmol/L (ref 3.5–5.1)
Potassium: 3.7 mmol/L (ref 3.5–5.1)
Potassium: 3.9 mmol/L (ref 3.5–5.1)
Potassium: 4.6 mmol/L (ref 3.5–5.1)
Sodium: 135 mmol/L (ref 135–145)
Sodium: 135 mmol/L (ref 135–145)
Sodium: 136 mmol/L (ref 135–145)
Sodium: 137 mmol/L (ref 135–145)

## 2021-04-15 LAB — GLUCOSE, CAPILLARY
Glucose-Capillary: 126 mg/dL — ABNORMAL HIGH (ref 70–99)
Glucose-Capillary: 149 mg/dL — ABNORMAL HIGH (ref 70–99)
Glucose-Capillary: 163 mg/dL — ABNORMAL HIGH (ref 70–99)
Glucose-Capillary: 164 mg/dL — ABNORMAL HIGH (ref 70–99)
Glucose-Capillary: 167 mg/dL — ABNORMAL HIGH (ref 70–99)
Glucose-Capillary: 171 mg/dL — ABNORMAL HIGH (ref 70–99)
Glucose-Capillary: 182 mg/dL — ABNORMAL HIGH (ref 70–99)
Glucose-Capillary: 183 mg/dL — ABNORMAL HIGH (ref 70–99)
Glucose-Capillary: 184 mg/dL — ABNORMAL HIGH (ref 70–99)
Glucose-Capillary: 192 mg/dL — ABNORMAL HIGH (ref 70–99)
Glucose-Capillary: 198 mg/dL — ABNORMAL HIGH (ref 70–99)
Glucose-Capillary: 214 mg/dL — ABNORMAL HIGH (ref 70–99)
Glucose-Capillary: 222 mg/dL — ABNORMAL HIGH (ref 70–99)
Glucose-Capillary: 228 mg/dL — ABNORMAL HIGH (ref 70–99)
Glucose-Capillary: 304 mg/dL — ABNORMAL HIGH (ref 70–99)

## 2021-04-15 LAB — TROPONIN I (HIGH SENSITIVITY)
Troponin I (High Sensitivity): 7 ng/L (ref <18)
Troponin I (High Sensitivity): 6 ng/L (ref <18)

## 2021-04-15 LAB — BETA-HYDROXYBUTYRIC ACID: Beta-Hydroxybutyric Acid: 3.26 mmol/L — ABNORMAL HIGH (ref 0.05–0.27)

## 2021-04-15 LAB — MAGNESIUM: Magnesium: 2 mg/dL (ref 1.7–2.4)

## 2021-04-15 MED ORDER — SERTRALINE HCL 50 MG PO TABS
75.0000 mg | ORAL_TABLET | Freq: Every day | ORAL | Status: DC
  Administered 2021-04-15 – 2021-04-16 (×2): 75 mg via ORAL
  Filled 2021-04-15: qty 1
  Filled 2021-04-15: qty 2

## 2021-04-15 MED ORDER — CHLORHEXIDINE GLUCONATE CLOTH 2 % EX PADS
6.0000 | MEDICATED_PAD | Freq: Every day | CUTANEOUS | Status: DC
  Administered 2021-04-15 (×2): 6 via TOPICAL

## 2021-04-15 MED ORDER — INSULIN ASPART 100 UNIT/ML IJ SOLN
5.0000 [IU] | Freq: Once | INTRAMUSCULAR | Status: AC
  Administered 2021-04-15: 5 [IU] via SUBCUTANEOUS

## 2021-04-15 MED ORDER — POTASSIUM CHLORIDE 10 MEQ/50ML IV SOLN
10.0000 meq | INTRAVENOUS | Status: AC
  Administered 2021-04-15 (×4): 10 meq via INTRAVENOUS
  Filled 2021-04-15 (×4): qty 50

## 2021-04-15 MED ORDER — INSULIN GLARGINE-YFGN 100 UNIT/ML ~~LOC~~ SOLN: 20.0000 [IU] | Freq: Every day | SUBCUTANEOUS | Status: DC

## 2021-04-15 MED ORDER — SUMATRIPTAN SUCCINATE 25 MG PO TABS
12.5000 mg | ORAL_TABLET | ORAL | Status: DC | PRN
  Filled 2021-04-15: qty 1

## 2021-04-15 MED ORDER — INSULIN ASPART 100 UNIT/ML IJ SOLN
0.0000 [IU] | Freq: Three times a day (TID) | INTRAMUSCULAR | Status: DC
  Administered 2021-04-15: 2 [IU] via SUBCUTANEOUS
  Administered 2021-04-15: 1 [IU] via SUBCUTANEOUS
  Administered 2021-04-16: 2 [IU] via SUBCUTANEOUS
  Administered 2021-04-16: 3 [IU] via SUBCUTANEOUS
  Administered 2021-04-16: 7 [IU] via SUBCUTANEOUS

## 2021-04-15 MED ORDER — INSULIN ASPART 100 UNIT/ML IJ SOLN
3.0000 [IU] | Freq: Three times a day (TID) | INTRAMUSCULAR | Status: DC
  Administered 2021-04-15 – 2021-04-16 (×4): 3 [IU] via SUBCUTANEOUS

## 2021-04-15 MED ORDER — ALBUTEROL SULFATE (2.5 MG/3ML) 0.083% IN NEBU: 2.5000 mg | INHALATION_SOLUTION | Freq: Four times a day (QID) | RESPIRATORY_TRACT | Status: DC | PRN

## 2021-04-15 MED ORDER — INSULIN GLARGINE-YFGN 100 UNIT/ML ~~LOC~~ SOLN
20.0000 [IU] | Freq: Every day | SUBCUTANEOUS | Status: DC
  Administered 2021-04-15 – 2021-04-16 (×2): 20 [IU] via SUBCUTANEOUS
  Filled 2021-04-15 (×3): qty 0.2

## 2021-04-15 MED ORDER — ACETAMINOPHEN 325 MG PO TABS
650.0000 mg | ORAL_TABLET | Freq: Four times a day (QID) | ORAL | Status: DC | PRN
  Administered 2021-04-15 – 2021-04-16 (×2): 650 mg via ORAL
  Filled 2021-04-15 (×2): qty 2

## 2021-04-15 MED ORDER — DICLOFENAC SODIUM 1 % EX GEL
2.0000 g | Freq: Four times a day (QID) | CUTANEOUS | Status: DC
  Administered 2021-04-15 (×2): 2 g via TOPICAL
  Filled 2021-04-15: qty 100

## 2021-04-15 MED ORDER — ALBUTEROL SULFATE HFA 108 (90 BASE) MCG/ACT IN AERS: 2.0000 | INHALATION_SPRAY | Freq: Four times a day (QID) | RESPIRATORY_TRACT | Status: DC | PRN
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
## 2021-04-15 NOTE — Progress Notes (Signed)
[place] ADULT ICU REPLACEMENT PROTOCOL ? ? ?The patient does apply for the [facility] Adult ICU Electrolyte Replacment Protocol based on the criteria listed below:  ? ?1.Exclusion criteria: TCTS patients, ECMO patients, and Dialysis patients ?2. Is GFR >/= 30 ml/min? Yes.    ?Patient's GFR today is >60 ?3. Is SCr </= 2? Yes.   ?Patient's SCr is 0.85 mg/dL ?4. Did SCr increase >/= 0.5 in 24 hours? No. ?5.Pt's weight >40kg  Yes.   ?6. Abnormal electrolyte(s): K+ 3.5  ?7. Electrolytes replaced per protocol ?8.  Call MD STAT for K+ </= 2.5, Phos </= 1, or Mag </= 1 ?Physician:  Arsenio Loader ? ?Genelle Bal 04/15/2021 1:02 AM  ?

## 2021-04-15 NOTE — Progress Notes (Signed)
FPTS Interim Progress Note ? ?S:Patient reports chest pain when she pushes on her anterior chest. It is unchanged from this morning and she does not have any other complaints. Denies any feeling of pressure, shortness of breath, increased anxiety. ? ?O: ?BP 112/70   Pulse (!) 118   Temp 99.4 ?F (37.4 ?C)   Resp (!) 34   Wt 46.1 kg   LMP 03/28/2021 (Approximate)   SpO2 98%   BMI 18.59 kg/m?   ?General: NAD, laying in bed, overall well appearing ?CV: sinus tachycardia (also seen on telemetry) ?MSK: Anterior chest wall TTP  ? ?A/P: ?Costochondritis ?Physical exam and history consistent with costochondritis. Troponin levels were negative and flat earlier and the pain is unchanged. Remains tachycardic, but stable. Do not believe PE at this time.  ?- Tylenol PRN  ?- Voltaren gel PRN ? ?Evelena Leyden, DO ?04/15/2021, 3:31 PM ?PGY-2, [facility] ?Service pager [phone] ? ?

## 2021-04-15 NOTE — Progress Notes (Addendum)
Family Medicine Teaching Service ?Daily Progress Note ?Intern Pager: [phone] ? ?Patient name: Dawn [name] Medical record number: [id number] ?Date of birth: 12-Jul-2000 Age: 21 y.o. Gender: female ? ?Primary Care Provider: Dana Allan, MD ?Consultants: PCCM ?Code Status: Full ? ?Pt Overview and Major Events to Date:  ?4/12 admitt, transfer to ICU ?4/13 transfer to FPTS  ? ?Assessment and Plan: ?Dawn [name] is a 21 y.o. female presenting with DKA. PMH is significant for poorly controlled T1DM, asthma, MDD, migraines ? ?DKA, resolved ?Poorly controlled Type 1 Diabetes Mellitus ?Of endotool. Tolerating PO. Continues to be tachycardic which likely secondary to poorly controlled diabetes. AM glucose 102. ?-CBG monitoring ?-Semglee 20 u, sensitive ssi, 3 u meal coverage ?-Diabetes coordinator consult, greatly appreciate input ? ?Electrolyte Disturbance ?K 2.8, Mg 1.8 ?-Klorcon 40 meq x2 ?-Mg Sulfate IV 1 g ?-Repeat afternoon BMP and Mg ? ?Costochondritis ?Resting comfortably. EKG yesterday showed some T wave changes relative to prior EKG but trops flat. Pain well controlled with voltaren gel and tylenol ?-Tylenol 650 mg q6 hour prn ?-Voltaren gel ? ?AKI, resolved ? ?Asthma: Chronic, stable ?-Continue albuterol PRN ? ?MDD: chronic, stable ?Home med: Zoloft 75 daily ?-Continue home med ?  ?H/o migraines: chronic, stable ?Home med: imitrex 12.5 mg q2 hour prn ?-Continue home imitrex ?  ? ?FEN/GI: carb modified ?PPx: lovenox ?Dispo:Home today. Barriers include diabetes management, cardiac workup.  ? ?Subjective:  ?Patient seen and assessed at bedside.  Boyfriend in bed with patient.  Patient denies any acute concerns at this time.  Able to tolerate p.o. diet. ? ?Objective: ?Temp:  [98.2 ?F (36.8 ?C)-99.7 ?F (37.6 ?C)] 98.3 ?F (36.8 ?C) (04/13 1600) ?Pulse Rate:  [111-132] 114 (04/13 1602) ?Resp:  [17-37] 18 (04/13 1602) ?BP: (95-165)/(70-110) 101/82 (04/13 1602) ?SpO2:  [97 %-100 %] 100 % (04/13 1602) ?Weight:  [46.1 kg]  46.1 kg (04/12 2115) ?Physical Exam: ?General: Resting comfortably, NAD ?Cardiovascular: RRR ?Respiratory: CTA B ?Abdomen: Soft, nontender ?Extremities: Warm, dry ? ?Laboratory: ?Recent Labs  ?Lab 04/10/21 ?[id number] 04/10/21 ?1015 04/14/21 ?0705 04/14/21 ?0809 04/14/21 ?1218  ?WBC 16.2*  --  15.0*  --   --   ?HGB 15.0   < > 13.8 16.3* 15.6*  ?HCT 49.9*   < > 46.9* 48.0* 46.0  ?PLT 557*  --  410*  --   --   ? < > = values in this interval not displayed.  ? ?Recent Labs  ?Lab 04/14/21 ?8502 04/15/21 ?7741 04/15/21 ?2878  ?NA 135 135 136  ?K 3.5 3.9 4.6  ?CL 115* 114* 115*  ?CO2 8* 11* 15*  ?BUN 16 11 11   ?CREATININE 0.85 0.75 0.61  ?CALCIUM 8.7* 8.3* 9.0  ?GLUCOSE 498* 604* 184*  ? ? ? ? ?Imaging/Diagnostic Tests: ?No results found. ? ? , MD ?04/15/2021, 4:43 PM ?PGY-1,  Family Medicine ?FPTS Intern pager: [phone], text pages welcome ?

## 2021-04-15 NOTE — Progress Notes (Signed)
FPTS Brief Progress Note ? ?S: No acute complaints at this time. Chest pain is improved. Eating and drinking without issue. No abdominal pain or nausea.  ? ? ?O: ?BP 101/82   Pulse (!) 120   Temp 98.6 ?F (37 ?C) (Oral)   Resp (!) 22   Wt 46.1 kg   LMP 03/28/2021 (Approximate)   SpO2 99%   BMI 18.59 kg/m?   ?Gen: resting comfortably in bed ?Resp: normal WOB on RA ?Chest: Without tenderness to palpation ?Abd: non-tender, non-distended ? ?A/P: ?DKA, resolved ?3pm BMP never made it to lab. Has been recollected. ?- F/u BMP, mag ?- Trend CBG and adjust insulin dosing as needed ? ?Costochondritis ?Pain seems well controlled with Tylenol and PRN voltaren gel. ?- Continue ? ?- Orders reviewed. Labs for AM ordered, which was adjusted as needed.  ?- If condition changes, plan includes increased insulin coverage.  ? ?Eppie Gibson, MD ?04/15/2021, 9:10 PM ?PGY-1, [facility] Night Resident  ?Please page [phone] with questions.  ? ? ?

## 2021-04-15 NOTE — Progress Notes (Addendum)
Family Medicine Teaching Service ?Daily Progress Note ?Intern Pager: [phone] ? ?Patient name: Dawn [name] Medical record number: [id number] ?Date of birth: [birth year] Age: 21 y.o. Gender: female ? ?Primary Care Provider: [name], MD ?Consultants: PCCM ?Code Status: Full ? ?Pt Overview and Major Events to Date:  ?4/12 admitt, transfer to ICU ?4/13 transfer to [place]  ? ?Assessment and Plan: ?Braydee Dosch is a 21 y.o. female presenting with DKA. PMH is significant for poorly controlled T1DM, asthma, MDD, migraines ? ?DKA in setting of poorly controlled T1DM ?Anion Gap 12 > 10. BHB 7.42>3.26. CBG 222>192. K 4.6. Able to tolerate PO. ?-Diet resumed, carb modified ?-Transition off endotool ?-Start Semglee 20 unit, sensitive SSI, 3 u meal coverage, likely will increase ?-CBG monitoring ?-AM CBC ?-Afternoon BMP and Mg ?-AM BMP ? ?Chest Pain ?Reports chest pressure, persistent, worsens with palpation. Likely MSK but will get EKG. Tachycardic in 120s. ?-Tylenol 650 mg q6 hour prn ?-EKG showed T wave changes ?-Troponin x2 pending ? ?AKI, resolved ?-Cr 0.61 ? ?Asthma: Chronic, stable ?-Resume albuterol PRN ? ?MDD: chronic, stable ?Home med: Zoloft 75 daily ?-Resume home med ? ?H/o migraines: chronic, stable ?Home med: imitrex 12.5 mg q2 hour prn ?-Resume home imitrex ? ?FEN/GI: Carb modified ?PPx: Lovenox ?Dispo:Home tomorrow. Barriers include diabetes management, cardiac workup.  ? ?Subjective:  ?Patient seen and assessed at bedside.  Boyfriend at bedside.  Patient reports some chest pressure but denies any other concerns at this time.  Denies abdominal pain, nausea, vomiting. ? ?Objective: ?Temp:  [97.5 ?F (36.4 ?C)-99.7 ?F (37.6 ?C)] 99.7 ?F (37.6 ?C) (04/13 0329) ?Pulse Rate:  [99-131] 124 (04/13 0600) ?Resp:  [17-31] 18 (04/13 0600) ?BP: (95-165)/(70-110) 110/82 (04/13 0600) ?SpO2:  [96 %-100 %] 100 % (04/13 0600) ?Weight:  [101 lb 10.1 oz (46.1 kg)] 101 lb 10.1 oz (46.1 kg) (04/12 2115) ?Physical Exam: ?General:  resting comfortably, NAD ?Cardiovascular: tachycardic, regular rhythm ?Respiratory: CTAB ?Abdomen: soft nontender ?Extremities: warm, dry ? ?Laboratory: ?Recent Labs  ?Lab 04/10/21 ?[id number] 04/10/21 ?1015 04/14/21 ?0705 04/14/21 ?0809 04/14/21 ?1218  ?WBC 16.2*  --  15.0*  --   --   ?HGB 15.0   < > 13.8 16.3* 15.6*  ?HCT 49.9*   < > 46.9* 48.0* 46.0  ?PLT 557*  --  410*  --   --   ? < > = values in this interval not displayed.  ? ?Recent Labs  ?Lab 04/14/21 ?1931 04/14/21 ?2343 04/15/21 ?0314  ?NA 136 135 135  ?K 3.8 3.5 3.9  ?CL 112* 115* 114*  ?CO2 <7* 8* 11*  ?BUN 22* 16 11  ?CREATININE 1.11* 0.85 0.75  ?CALCIUM 9.0 8.7* 8.3*  ?GLUCOSE 247* 498* 604*  ? ? ? ?Imaging/Diagnostic Tests: ?No new ? ?France Ravens, MD ?04/15/2021, 6:09 AM ?PGY-1, [facility] ?[place] Intern pager: [phone], text pages welcome ?

## 2021-04-16 DIAGNOSIS — R0789 Other chest pain: Secondary | ICD-10-CM

## 2021-04-16 DIAGNOSIS — E1065 Type 1 diabetes mellitus with hyperglycemia: Secondary | ICD-10-CM

## 2021-04-16 LAB — BASIC METABOLIC PANEL
Anion gap: 7 (ref 5–15)
Anion gap: 5 (ref 5–15)
BUN: 7 mg/dL (ref 6–20)
BUN: 11 mg/dL (ref 6–20)
CO2: 20 mmol/L — ABNORMAL LOW (ref 22–32)
CO2: 24 mmol/L (ref 22–32)
Calcium: 8.4 mg/dL — ABNORMAL LOW (ref 8.9–10.3)
Calcium: 8.5 mg/dL — ABNORMAL LOW (ref 8.9–10.3)
Chloride: 109 mmol/L (ref 98–111)
Chloride: 108 mmol/L (ref 98–111)
Creatinine, Ser: 0.42 mg/dL — ABNORMAL LOW (ref 0.44–1.00)
Creatinine, Ser: 0.63 mg/dL (ref 0.44–1.00)
GFR, Estimated: >60 mL/min (ref >60)
GFR, Estimated: >60 mL/min (ref >60)
Glucose, Bld: 102 mg/dL — ABNORMAL HIGH (ref 70–99)
Glucose, Bld: 360 mg/dL — ABNORMAL HIGH (ref 70–99)
Potassium: 2.8 mmol/L — ABNORMAL LOW (ref 3.5–5.1)
Potassium: 4.2 mmol/L (ref 3.5–5.1)
Sodium: 136 mmol/L (ref 135–145)
Sodium: 137 mmol/L (ref 135–145)

## 2021-04-16 LAB — GLUCOSE, CAPILLARY
Glucose-Capillary: 169 mg/dL — ABNORMAL HIGH (ref 70–99)
Glucose-Capillary: 241 mg/dL — ABNORMAL HIGH (ref 70–99)
Glucose-Capillary: 345 mg/dL — ABNORMAL HIGH (ref 70–99)

## 2021-04-16 LAB — MAGNESIUM
Magnesium: 1.8 mg/dL (ref 1.7–2.4)
Magnesium: 2.6 mg/dL — ABNORMAL HIGH (ref 1.7–2.4)

## 2021-04-16 MED ORDER — POTASSIUM CHLORIDE 20 MEQ PO PACK
40.0000 meq | PACK | ORAL | Status: AC
  Administered 2021-04-16 (×2): 40 meq via ORAL
  Filled 2021-04-16 (×2): qty 2

## 2021-04-16 MED ORDER — MAGNESIUM SULFATE IN D5W 1-5 GM/100ML-% IV SOLN
1.0000 g | Freq: Once | INTRAVENOUS | Status: AC
  Administered 2021-04-16: 1 g via INTRAVENOUS
  Filled 2021-04-16: qty 100

## 2021-04-16 NOTE — Discharge Summary (Addendum)
Family Medicine Teaching Service ?Hospital Discharge Summary ? ?Patient name: Dawn [name] Medical record number: [id number] ?Date of birth: [birth year] Age: 21 y.o. Gender: female ?Date of Admission: 04/14/2021  Date of Discharge: 04/16/21 ?Admitting Physician: Luciano Cutter, MD ? ?Primary Care Provider: Dana Allan, MD ?Consultants: PCCM ? ?Indication for Hospitalization: DKA ? ?Discharge Diagnoses/Problem List:  ?DKA ?AKI ?Asthma ?MDD ?Migraines ? ?Disposition: home ? ?Discharge Condition: stable ? ?Discharge Exam:  ?Blood pressure (!) 100/59, pulse 95, temperature 98.4 ?F (36.9 ?C), temperature source Oral, resp. rate 18, height 5\' 2"  (1.575 m), weight 50.1 kg, last menstrual period 03/28/2021, SpO2 100 %. ?General: Resting comfortably, NAD ?Cardiovascular: RRR ?Respiratory: CTA B ?Abdomen: Soft, nontender ?Extremities: Warm, dry ? ?Brief Hospital Course:  ?Dawn [name] is a 21 y.o. female who was admitted to [facility] for acute onset hyperglycemia with labs consistent with DKA. Hospital course is outlined below.   ? ?T1DM  DKA ?In the ED, labs were consistent with DKA. Their initial labs were as followed: pH 6.9, glucose 750, CO2 <7, AG 27 beta-hydroxybutyrate >8 with large/moderate ketones in the urine.  She was started on Endo tool. They were then transferred to the ICU as she had worsened mental status and pH dropped to 6.8. Stayed in the ICU only overnight. IV fluids were stopped once anion gap was cleared x2 and patient demonstrated ability to have p.o. intake. She was transitioned to [facility] with 5U mealtime insulin. She was discharged on her home regimen of 30U Lantus and 5U of meal time insulin. At the time of discharge the patient had an understanding of their home insulin regimen and all medications were discussed with the patient prior to discharge.  ? ?AKI ?Serum creatinine elevated to 1.7.  This was suspected to be prerenal given DKA and dehydration.   Creatinine improved to 0.61.  ? ?Hypokalemia  ?Potassium was 2.8 after discontinued Endo tool, so patient was appropriately repleted with Klor-Con. Potassium was 4.2 on discharge. Magnesium mildly elevated at 2.6 on discharge.  ? ?Other chronic conditions were medically managed with home medications and formulary alternatives as necessary (asthma, MDD, migraines) ? ?PCP Follow-up Recommendations: ?-Consider follow up Magnesium- only mildly elevated at 2.6 at d/c ?-Assess compliance with insulin regimen ?-Consider f/u BMP to assess electrolytes  ?-Depo due at end of month ?-Ensure partner treatment for prior STD ? ?Significant Procedures: none ? ?Significant Labs and Imaging:  ?Recent Labs  ?Lab 04/10/21 ?06/10/21 04/10/21 ?1015 04/14/21 ?0705 04/14/21 ?0809 04/14/21 ?1218  ?WBC 16.2*  --  15.0*  --   --   ?HGB 15.0   < > 13.8 16.3* 15.6*  ?HCT 49.9*   < > 46.9* 48.0* 46.0  ?PLT 557*  --  410*  --   --   ? < > = values in this interval not displayed.  ? ?Recent Labs  ?Lab 04/14/21 ?0705 04/14/21 ?0809 04/14/21 ?1209 04/14/21 ?1218 04/15/21 ?04/17/21 04/15/21 ?04/17/21 04/15/21 ?2042 04/16/21 ?0421 04/16/21 ?1610  ?NA 134*   < > 138   < > 135 136 137 136 137  ?K 5.6*   < > 5.7*   < > 3.9 4.6 3.7 2.8* 4.2  ?CL 100   < > 108   < > 114* 115* 110 109 108  ?CO2 <7*   < > <7*   < > 11* 15* 17* 20* 24  ?GLUCOSE 750*   < > 603*   < > 604* 184* 317* 102* 360*  ?BUN  29*   < > 32*   < > 11 11 9 7 11   ?CREATININE 1.70*   < > 1.44*   < > 0.75 0.61 0.63 0.42* 0.63  ?CALCIUM 10.2   < > 9.9   < > 8.3* 9.0 8.3* 8.4* 8.5*  ?MG 3.0*  --  2.8*  --   --   --  2.0 1.8 2.6*  ? < > = values in this interval not displayed.  ? ?Results/Tests Pending at Time of Discharge: none ? ?Discharge Medications:  ?Allergies as of 04/16/2021   ? ?   Reactions  ? Latex Other (See Comments)  ? Causes skin irritation  ? Tape Other (See Comments)  ? Causes skin irritation  ? Shellfish Allergy Rash, Other (See Comments)  ? Reaction to scallops  ? ?  ? ?  ?Medication List   ?  ? ?STOP taking these medications   ? ?doxycycline 100 MG tablet ?Commonly known as: VIBRA-TABS ?  ?oxyCODONE 5 MG immediate release tablet ?Commonly known as: Oxy IR/ROXICODONE ?  ? ?  ? ?TAKE these medications   ? ?Accu-Chek Guide test strip ?Generic drug: glucose blood ?Use to check glucose 6x daily ?  ?Accu-Chek Softclix Lancets lancets ?Please use to check blood sugar up to 6 times/day. ?  ?albuterol (2.5 MG/3ML) 0.083% nebulizer solution ?Commonly known as: PROVENTIL ?Take 2.5 mg by nebulization every 6 (six) hours as needed for wheezing or shortness of breath. ?  ?albuterol 108 (90 Base) MCG/ACT inhaler ?Commonly known as: VENTOLIN HFA ?Inhale 2 puffs into the lungs every 6 (six) hours as needed for wheezing or shortness of breath. ?  ?Baqsimi One Pack 3 MG/DOSE Powd ?Generic drug: Glucagon ?Place 3 mg into the nose once as needed (low blood sugar). ?  ?BD Pen Needle Nano 2nd Gen 32G X 4 MM Misc ?Generic drug: Insulin Pen Needle ?USE TO INJECT INSULIN 6 TIMES DAILY ?  ?insulin lispro 100 UNIT/ML KwikPen ?Commonly known as: HumaLOG KwikPen ?Inject 5 Units into the skin with breakfast, with lunch, and with evening meal. ?What changed:  ?how much to take ?when to take this ?additional instructions ?  ?INSULIN SYRINGE 1CC/30GX1/2" 30G X 1/2" 1 ML Misc ?Use to draw up Lantus daily. ?  ?INSULIN SYRINGE .5CC/30GX1/2" 30G X 1/2" 0.5 ML Misc ?Use to draw up Novolog three times daily. ?  ?Lantus SoloStar 100 UNIT/ML Solostar Pen ?Generic drug: insulin glargine ?Inject 30 Units into the skin at bedtime. ?  ?sertraline 50 MG tablet ?Commonly known as: ZOLOFT ?Take 1.5 tablets (75 mg total) by mouth daily. ?  ?SUMAtriptan 25 MG tablet ?Commonly known as: Imitrex ?Take 0.5 tablets (12.5 mg total) by mouth every 2 (two) hours as needed for migraine. May repeat in 2 hours if headache persists or recurs. ?  ?TYLENOL PO ?Take 2 tablets by mouth every 6 (six) hours as needed (pain/fever). ?  ? ?  ? ? ?Discharge Instructions:  Please refer to Patient Instructions section of EMR for full details.  Patient was counseled important signs and symptoms that should prompt return to medical care, changes in medications, dietary instructions, activity restrictions, and follow up appointments.  ? ?04/18/2021 MD ?04/16/2021, 2:22 PM ?PGY-1, [facility] ? ?FPTS Upper-Level Resident Addendum ?  ?I have independently interviewed and examined the patient. I have discussed the above with the original author and agree with their documentation. My edits for correction/addition/clarification are included where appropriate. Please see also any attending notes.  ? ?  Sabino DickAlejandra Zedrick Springsteen, DO ?PGY-2,  Family Medicine ?04/16/2021 5:40 PM  ?FPTS Service pager: [phone] (text pages welcome through [facility]) ? ?

## 2021-04-19 ENCOUNTER — Ambulatory Visit: Payer: Medicaid Other | Admitting: Family Medicine

## 2021-05-31 ENCOUNTER — Emergency Department (HOSPITAL_COMMUNITY)
Admission: EM | Admit: 2021-05-31 | Discharge: 2021-06-01 | Disposition: A | Discharge status: Home / Self Care | Payer: Medicaid Other | Attending: Emergency Medicine | Admitting: Emergency Medicine

## 2021-05-31 ENCOUNTER — Other Ambulatory Visit: Payer: Self-pay

## 2021-05-31 ENCOUNTER — Ambulatory Visit
Admission: EM | Admit: 2021-05-31 | Discharge: 2021-05-31 | Disposition: A | Discharge status: Home / Self Care | Payer: Medicaid Other | Attending: Internal Medicine | Admitting: Internal Medicine

## 2021-05-31 ENCOUNTER — Encounter (HOSPITAL_COMMUNITY): Payer: Self-pay | Admitting: Emergency Medicine

## 2021-05-31 DIAGNOSIS — J45909 Unspecified asthma, uncomplicated: Secondary | ICD-10-CM | POA: Insufficient documentation

## 2021-05-31 DIAGNOSIS — Z794 Long term (current) use of insulin: Secondary | ICD-10-CM | POA: Insufficient documentation

## 2021-05-31 DIAGNOSIS — Z7951 Long term (current) use of inhaled steroids: Secondary | ICD-10-CM | POA: Insufficient documentation

## 2021-05-31 DIAGNOSIS — N926 Irregular menstruation, unspecified: Secondary | ICD-10-CM

## 2021-05-31 DIAGNOSIS — R739 Hyperglycemia, unspecified: Secondary | ICD-10-CM | POA: Insufficient documentation

## 2021-05-31 DIAGNOSIS — Z91040 Latex allergy status: Secondary | ICD-10-CM | POA: Insufficient documentation

## 2021-05-31 DIAGNOSIS — E1065 Type 1 diabetes mellitus with hyperglycemia: Secondary | ICD-10-CM

## 2021-05-31 DIAGNOSIS — R1031 Right lower quadrant pain: Secondary | ICD-10-CM | POA: Diagnosis not present

## 2021-05-31 DIAGNOSIS — Z113 Encounter for screening for infections with a predominantly sexual mode of transmission: Secondary | ICD-10-CM | POA: Diagnosis present

## 2021-05-31 DIAGNOSIS — N9489 Other specified conditions associated with female genital organs and menstrual cycle: Secondary | ICD-10-CM | POA: Diagnosis not present

## 2021-05-31 LAB — COMPREHENSIVE METABOLIC PANEL
ALT: 15 U/L (ref 0–44)
AST: 18 U/L (ref 15–41)
Albumin: 3.3 g/dL — ABNORMAL LOW (ref 3.5–5.0)
Alkaline Phosphatase: 107 U/L (ref 38–126)
Anion gap: 10 (ref 5–15)
BUN: 17 mg/dL (ref 6–20)
CO2: 23 mmol/L (ref 22–32)
Calcium: 9.3 mg/dL (ref 8.9–10.3)
Chloride: 101 mmol/L (ref 98–111)
Creatinine, Ser: 0.8 mg/dL (ref 0.44–1.00)
GFR, Estimated: >60 mL/min (ref >60)
Glucose, Bld: 476 mg/dL — ABNORMAL HIGH (ref 70–99)
Potassium: 4.1 mmol/L (ref 3.5–5.1)
Sodium: 134 mmol/L — ABNORMAL LOW (ref 135–145)
Total Bilirubin: 0.8 mg/dL (ref 0.3–1.2)
Total Protein: 7 g/dL (ref 6.5–8.1)

## 2021-05-31 LAB — WET PREP, GENITAL
Clue Cells Wet Prep HPF POC: NONE SEEN
Sperm: NONE SEEN
Trich, Wet Prep: NONE SEEN
WBC, Wet Prep HPF POC: <10 (ref <10)
Yeast Wet Prep HPF POC: NONE SEEN

## 2021-05-31 LAB — CBC WITH DIFFERENTIAL/PLATELET
Abs Immature Granulocytes: 0.02 10*3/uL (ref 0.00–0.07)
Basophils Absolute: 0.1 10*3/uL (ref 0.0–0.1)
Basophils Relative: 1 %
Eosinophils Absolute: 0.2 10*3/uL (ref 0.0–0.5)
Eosinophils Relative: 2 %
HCT: 37.6 % (ref 36.0–46.0)
Hemoglobin: 12.4 g/dL (ref 12.0–15.0)
Immature Granulocytes: 0 %
Lymphocytes Relative: 44 %
Lymphs Abs: 3.6 10*3/uL (ref 0.7–4.0)
MCH: 29 pg (ref 26.0–34.0)
MCHC: 33 g/dL (ref 30.0–36.0)
MCV: 88.1 fL (ref 80.0–100.0)
Monocytes Absolute: 0.5 10*3/uL (ref 0.1–1.0)
Monocytes Relative: 6 %
Neutro Abs: 3.9 10*3/uL (ref 1.7–7.7)
Neutrophils Relative %: 47 %
Platelets: 272 10*3/uL (ref 150–400)
RBC: 4.27 MIL/uL (ref 3.87–5.11)
RDW: 14 % (ref 11.5–15.5)
WBC: 8.3 10*3/uL (ref 4.0–10.5)
nRBC: 0 % (ref 0.0–0.2)

## 2021-05-31 LAB — POCT URINALYSIS DIP (MANUAL ENTRY)
Bilirubin, UA: NEGATIVE
Blood, UA: NEGATIVE
Glucose, UA: 500 mg/dL — AB
Leukocytes, UA: NEGATIVE
Nitrite, UA: NEGATIVE
Protein Ur, POC: NEGATIVE mg/dL
Spec Grav, UA: 1.01 (ref 1.010–1.025)
Urobilinogen, UA: 0.2 E.U./dL
pH, UA: 5 (ref 5.0–8.0)

## 2021-05-31 LAB — I-STAT VENOUS BLOOD GAS, ED
Acid-Base Excess: 1 mmol/L (ref 0.0–2.0)
Bicarbonate: 25.4 mmol/L (ref 20.0–28.0)
Calcium, Ion: 1.16 mmol/L (ref 1.15–1.40)
HCT: 38 % (ref 36.0–46.0)
Hemoglobin: 12.9 g/dL (ref 12.0–15.0)
O2 Saturation: 89 %
Potassium: 4 mmol/L (ref 3.5–5.1)
Sodium: 134 mmol/L — ABNORMAL LOW (ref 135–145)
TCO2: 27 mmol/L (ref 22–32)
pCO2, Ven: 38.7 mmHg — ABNORMAL LOW (ref 44–60)
pH, Ven: 7.425 (ref 7.25–7.43)
pO2, Ven: 56 mmHg — ABNORMAL HIGH (ref 32–45)

## 2021-05-31 LAB — CBG MONITORING, ED: Glucose-Capillary: 446 mg/dL — ABNORMAL HIGH (ref 70–99)

## 2021-05-31 LAB — I-STAT BETA HCG BLOOD, ED (MC, WL, AP ONLY): I-stat hCG, quantitative: <5 m[IU]/mL (ref <5)

## 2021-05-31 LAB — POCT FASTING CBG KUC MANUAL ENTRY: POCT Glucose (KUC): 562 mg/dL — AB (ref 70–99)

## 2021-05-31 LAB — POCT URINE PREGNANCY: Preg Test, Ur: NEGATIVE

## 2021-05-31 LAB — BETA-HYDROXYBUTYRIC ACID: Beta-Hydroxybutyric Acid: 1.32 mmol/L — ABNORMAL HIGH (ref 0.05–0.27)

## 2021-05-31 MED ORDER — INSULIN ASPART 100 UNIT/ML IJ SOLN
5.0000 [IU] | Freq: Once | INTRAMUSCULAR | Status: AC
  Administered 2021-05-31: 5 [IU] via SUBCUTANEOUS

## 2021-05-31 MED ORDER — LACTATED RINGERS IV BOLUS
1000.0000 mL | Freq: Once | INTRAVENOUS | Status: AC
  Administered 2021-05-31: 1000 mL via INTRAVENOUS

## 2021-05-31 MED ORDER — INSULIN GLARGINE-YFGN 100 UNIT/ML ~~LOC~~ SOLN
30.0000 [IU] | Freq: Once | SUBCUTANEOUS | Status: AC
  Administered 2021-05-31: 30 [IU] via SUBCUTANEOUS
  Filled 2021-05-31: qty 0.3

## 2021-05-31 NOTE — ED Provider Notes (Incomplete)
[place] EMERGENCY DEPARTMENT Provider Note   CSN: [id number] Arrival date & time: 05/31/21  2053     History {Add pertinent medical, surgical, social history, OB history to HPI:1} Chief Complaint  Patient presents with  . Hyperglycemia    Alanny Searson is a 21 y.o. female with a history of type 1 diabetes and multiple episodes of DKA, depression, asthma presenting to the ED with lower abdominal pain and hyperglycemia.  Patient states that her last menstrual period was about 1 month ago.  She did have some light spotting 2 weeks ago, but states that her normal period was due on 5/19.  She is currently sexually active and does not use birth control or condoms.  This past week, she has noted intermittent episodes of right lower quadrant pain that last for few minutes and then completely resolves.  She denies any vaginal discharge, dysuria, urinary frequency, fevers, nausea/vomiting.  She was initially seen in urgent care where she was noted to have a blood sugar in the 500s and was sent to the ED for further evaluation.  She states that she has not been checking her blood sugars regularly and is waiting for her Dexcom to come in to the pharmacy.  Has not missed any doses of her insulin.   Hyperglycemia Associated symptoms: abdominal pain   Associated symptoms: no dysuria, no fever, no nausea, no shortness of breath and no vomiting       Home Medications Prior to Admission medications   Medication Sig Start Date End Date Taking? Authorizing Provider  acetaminophen (TYLENOL) 500 MG tablet Take 1,000 mg by mouth every 6 (six) hours as needed for moderate pain or headache.   Yes [provider]  Glucagon (BAQSIMI ONE PACK) 3 MG/DOSE POWD Place 3 mg into the nose once as needed (low blood sugar).   Yes [provider]  ibuprofen (ADVIL) 200 MG tablet Take 400 mg by mouth every 6 (six) hours as needed for headache or moderate pain.   Yes [provider]  insulin glargine (LANTUS SOLOSTAR) 100 UNIT/ML Solostar Pen Inject 30 Units into the skin at bedtime. 03/11/21  Yes Dameron, Luna Fuse, DO  insulin lispro (HUMALOG KWIKPEN) 100 UNIT/ML KwikPen Inject 5 Units into the skin with breakfast, with lunch, and with evening meal. Patient taking differently: Inject 5-15 Units into the skin See admin instructions. 3 times daily with meals- per sliding scale based on carb count 03/11/21 06/12/21 Yes Dameron, Luna Fuse, DO  Insulin Pen Needle (BD PEN NEEDLE NANO 2ND GEN) 32G X 4 MM MISC USE TO INJECT INSULIN 6 TIMES DAILY 04/11/21  Yes Erskine Emery, MD  sertraline (ZOLOFT) 50 MG tablet Take 1.5 tablets (75 mg total) by mouth daily. 03/11/21  Yes Dameron, Luna Fuse, DO  SUMAtriptan (IMITREX) 25 MG tablet Take 0.5 tablets (12.5 mg total) by mouth every 2 (two) hours as needed for migraine. May repeat in 2 hours if headache persists or recurs. Patient taking differently: Take 25 mg by mouth every 2 (two) hours as needed for migraine. May repeat in 2 hours if headache persists or recurs. 04/12/21  Yes [name], MD  Accu-Chek Softclix Lancets lancets Please use to check blood sugar up to 6 times/day. Patient not taking: Reported on 05/31/2021 04/11/21   Erskine Emery, MD  albuterol (VENTOLIN HFA) 108 (90 Base) MCG/ACT inhaler Inhale 2 puffs into the lungs every 6 (six) hours as needed for wheezing or shortness of breath. Patient not taking: Reported on 05/31/2021 03/24/21  [name], MD  Continuous Blood Gluc Transmit (DEXCOM G6 TRANSMITTER) MISC  05/26/21   [provider]  glucose blood (ACCU-CHEK GUIDE) test strip Use to check glucose 6x daily Patient not taking: Reported on 05/31/2021 04/11/21   Erskine Emery, MD      Allergies    Latex, Tape, and Shellfish allergy    Review of Systems   Review of Systems  Constitutional:  Negative for fever.  Respiratory:  Negative for cough and shortness of breath.   Gastrointestinal:  Positive for abdominal  pain. Negative for diarrhea, nausea and vomiting.  Genitourinary:  Negative for difficulty urinating, dysuria, frequency, vaginal bleeding and vaginal discharge.  Neurological:  Negative for syncope.   Physical Exam Updated Vital Signs BP 120/83 (BP Location: Right Arm)   Pulse 90   Temp 98.9 F (37.2 C) (Oral)   Resp 18   SpO2 100%  Physical Exam Vitals and nursing note reviewed. Exam conducted with a chaperone present.  Constitutional:      General: She is not in acute distress.    Appearance: Normal appearance. She is not ill-appearing, toxic-appearing or diaphoretic.  HENT:     Head: Normocephalic and atraumatic.     Right Ear: External ear normal.     Left Ear: External ear normal.     Nose: Nose normal.     Mouth/Throat:     Mouth: Mucous membranes are moist.     Pharynx: Oropharynx is clear.  Eyes:     General: No scleral icterus. Cardiovascular:     Rate and Rhythm: Normal rate and regular rhythm.     Heart sounds: Normal heart sounds. No murmur heard.   No friction rub. No gallop.  Pulmonary:     Effort: Pulmonary effort is normal. No respiratory distress.     Breath sounds: Normal breath sounds. No stridor. No wheezing, rhonchi or rales.  Abdominal:     General: There is no distension.     Palpations: Abdomen is soft.     Tenderness: There is no abdominal tenderness. There is no guarding or rebound.  Genitourinary:    Labia:        Right: No rash.        Left: No rash.      Vagina: No foreign body. No erythema or bleeding.     Cervix: No lesion.     Uterus: Normal.      Adnexa: Right adnexa normal and left adnexa normal.       Right: No tenderness or fullness.         Left: No tenderness or fullness.       Comments: Scant amount of clear to white discharge. Cervical os is closed. Small amount of scant bloody discharge from the cervical os. Musculoskeletal:        General: No deformity.     Cervical back: Neck supple.  Skin:    General: Skin is warm and  dry.  Neurological:     General: No focal deficit present.     Mental Status: She is alert and oriented to person, place, and time.    ED Results / Procedures / Treatments   Labs (all labs ordered are listed, but only abnormal results are displayed) Labs Reviewed  BETA-HYDROXYBUTYRIC ACID - Abnormal; Notable for the following components:      Result Value   Beta-Hydroxybutyric Acid 1.32 (*)    All other components within normal limits  COMPREHENSIVE METABOLIC PANEL - Abnormal; Notable for the following  components:   Sodium 134 (*)    Glucose, Bld 476 (*)    Albumin 3.3 (*)    All other components within normal limits  CBG MONITORING, ED - Abnormal; Notable for the following components:   Glucose-Capillary 446 (*)    All other components within normal limits  I-STAT VENOUS BLOOD GAS, ED - Abnormal; Notable for the following components:   pCO2, Ven 38.7 (*)    pO2, Ven 56 (*)    Sodium 134 (*)    All other components within normal limits  WET PREP, GENITAL  CBC WITH DIFFERENTIAL/PLATELET  I-STAT BETA HCG BLOOD, ED ([facility], [facility], [facility] ONLY)  I-STAT VENOUS BLOOD GAS, ED  CBG MONITORING, ED    EKG None  Radiology No results found.  Procedures Procedures  {Document cardiac monitor, telemetry assessment procedure when appropriate:1}  Medications Ordered in ED Medications  lactated ringers bolus 1,000 mL (1,000 mLs Intravenous New Bag/Given 05/31/21 2303)  insulin glargine-yfgn (SEMGLEE) injection 30 Units (30 Units Subcutaneous Given 05/31/21 2255)  insulin aspart (novoLOG) injection 5 Units (5 Units Subcutaneous Given 05/31/21 2258)    ED Course/ Medical Decision Making/ A&P                           Medical Decision Making Amount and/or Complexity of Data Reviewed Labs: ordered.  Risk Prescription drug management.    Januari Janos is a 21 y.o. female with a history of type 1 diabetes and multiple episodes of DKA, depression, asthma presenting to the ED with lower  abdominal pain and hyperglycemia.  On exam, the patient is afebrile and hemodynamically stable.  Her abdomen is soft and nontender to palpation in all quadrants.  On pelvic exam, she has a very small amount of bleeding from the cervical os, but no other abnormalities.  Bilateral adnexa without tenderness or fullness.  Beta-hCG is negative.  Given her normal abdominal exam, very low suspicion for appendicitis.  Patient has also not had any fevers, nausea, or vomiting.  Very low suspicion for ovarian torsion or other ovarian pathology given patient has no adnexal tenderness or persistent severe pain.  I do not think she requires imaging at this time.  Wet prep was obtained and was within normal limits.  Patient declined gonorrhea/chlamydia testing.  For her hyperglycemia, patient was initially in the 400s on arrival.  VBG shows a normal pH of 7.425.  Beta hydroxy is mildly elevated to 1.3, but on chart review it appears patient beta-hydroxybutyrate is typically extremely elevated when she is in DKA.  CBC is within normal limits.  CMP shows hyperglycemia but is otherwise unremarkable.  Patient not in DKA at this time.  Patient was given 1 L of IV fluid, her nighttime dose of glargine 30 units, and aspart 5 units.  On reevaluation, pt's repeat glucose is ***. Pt continues to feel well and has not had recurrence of abdominal pain while in the ED so I feel she is safe for discharge at this time.  I have recommended that she follow-up with her PCP within the next week.  Also recommended that she continue her home insulin dose and she frequently checked her blood sugars.  Patient verbalized understanding.  Strict return precautions were discussed and the patient was discharged home in stable condition.  {Document critical care time when appropriate:1} {Document review of labs and clinical decision tools ie heart score, Chads2Vasc2 etc:1}  {Document your independent review of radiology images, and any  outside records:1} {Document your discussion with family members, caretakers, and with consultants:1} {Document social determinants of health affecting pt's care:1} {Document your decision making why or why not admission, treatments were needed:1} Final Clinical Impression(s) / ED Diagnoses Final diagnoses:  Hyperglycemia  Right lower quadrant abdominal pain    Rx / DC Orders ED Discharge Orders     None
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
## 2021-05-31 NOTE — ED Notes (Signed)
Patient is being discharged from the Urgent Care and sent to the Emergency Department via friends car . Per [place] np, patient is in need of higher level of care due to high level of blood sugar and glucose in urine. . Patient is aware and verbalizes understanding of plan of care.  Vitals:   05/31/21 1848  BP: 118/81  Pulse: 73  Resp: 18  Temp: 97.9 F (36.6 C)  SpO2: 98%

## 2021-05-31 NOTE — Discharge Instructions (Addendum)
You lab work today was not concerning for DKA. Your abdominal exam and pelvic exam today did not show any abnormalities. Your pregnancy test was negative. For your hyperglycemia, you were given your night time dose of insulin, 5U of corrective insulin, and IV fluids. You do NOT need to take your night time dose of insulin tonight. Please continue your prescribed doses of insulin tomorrow morning and check your blood sugars regularly. Please see your primary care doctor within the next week.

## 2021-05-31 NOTE — ED Triage Notes (Addendum)
Patient presents to Urgent Care with complaints of 11 day missed period , cramping, and pos pregnancy test at home. Patient reports she needs referal OBGYN. And sti check

## 2021-05-31 NOTE — Discharge Instructions (Signed)
Please go to the emergency department as soon as you leave urgent care for further evaluation and management of hyperglycemia.

## 2021-05-31 NOTE — ED Triage Notes (Addendum)
Patient reports elevated blood sugar this evening 500+ , also requesting pregnancy test . Seen at an urgent care this evening prior to arrival . Patient adds intermittent low abdominal pain for several days .

## 2021-05-31 NOTE — ED Provider Notes (Signed)
[facility] EMERGENC[facility] DEPARTMENT Provider Note   CSN: [id number] Arrival date & time: 05/31/21  2053     History {Add pertinent medical, surgical, social history, OB history to HPI:1} Chief Complaint  Patient presents with   Hyperglycemia    Dawn [name] is a 21 y.o. female with a history of type 1 diabetes and multiple episodes of DKA, depression, asthma presenting to the ED with lower abdominal pain and hyperglycemia.  Patient states that her last menstrual period was about 1 month ago.  She did have some light spotting 2 weeks ago, but states that her normal period was due on 5/19.  She is currently sexually active and does not use birth control or condoms.  This past week, she has noted intermittent episodes of right lower quadrant pain that last for few minutes and then completely resolves.  She denies any vaginal discharge, dysuria, urinary frequency, fevers, nausea/vomiting.  She was initially seen in urgent care where she was noted to have a blood sugar in the 500s and was sent to the ED for further evaluation.  She states that she has not been checking her blood sugars regularly and is waiting for her Dexcom to come in to the pharmacy.  Has not missed any doses of her insulin.   Hyperglycemia Associated symptoms: abdominal pain   Associated symptoms: no dysuria, no fever, no nausea, no shortness of breath and no vomiting       Home Medications Prior to Admission medications   Medication Sig Start Date End Date Taking? Authorizing Provider  Accu-Chek Softclix Lancets lancets Please use to check blood sugar up to 6 times/day. 04/11/21   Erskine Emery, MD  Acetaminophen (TYLENOL PO) Take 2 tablets by mouth every 6 (six) hours as needed (pain/fever).    [provider]  albuterol (PROVENTIL) (2.5 MG/3ML) 0.083% nebulizer solution Take 2.5 mg by nebulization every 6 (six) hours as needed for wheezing or shortness of breath.    [provider]   albuterol (VENTOLIN HFA) 108 (90 Base) MCG/ACT inhaler Inhale 2 puffs into the lungs every 6 (six) hours as needed for wheezing or shortness of breath. 03/24/21   [name], MD  Glucagon (BAQSIMI ONE PACK) 3 MG/DOSE POWD Place 3 mg into the nose once as needed (low blood sugar).    [provider]  glucose blood (ACCU-CHEK GUIDE) test strip Use to check glucose 6x daily 04/11/21   Erskine Emery, MD  insulin glargine (LANTUS SOLOSTAR) 100 UNIT/ML Solostar Pen Inject 30 Units into the skin at bedtime. 03/11/21   Dameron, Luna Fuse, DO  insulin lispro (HUMALOG KWIKPEN) 100 UNIT/ML KwikPen Inject 5 Units into the skin with breakfast, with lunch, and with evening meal. Patient taking differently: Inject 10-20 Units into the skin See admin instructions. Inject 10-20 units subcutaneously with meals - per sliding scale based on carb count 03/11/21 06/12/21  Dameron, Luna Fuse, DO  Insulin Pen Needle (BD PEN NEEDLE NANO 2ND GEN) 32G X 4 MM MISC USE TO INJECT INSULIN 6 TIMES DAILY 04/11/21   Erskine Emery, MD  Insulin Syringe-Needle U-100 (INSULIN SYRINGE .5CC/30GX1/2") 30G X 1/2" 0.5 ML MISC Use to draw up Novolog three times daily. 06/08/20   Sharion Settler, DO  Insulin Syringe-Needle U-100 (INSULIN SYRINGE 1CC/30GX1/2") 30G X 1/2" 1 ML MISC Use to draw up Lantus daily. 06/08/20   Sharion Settler, DO  sertraline (ZOLOFT) 50 MG tablet Take 1.5 tablets (75 mg total) by mouth daily. 03/11/21   Orvis Brill, DO  SUMAtriptan (IMITREX) 25 MG tablet Take 0.5 tablets (12.5 mg total) by mouth every 2 (two) hours as needed for migraine. May repeat in 2 hours if headache persists or recurs. Patient not taking: Reported on 04/15/2021 04/12/21   [name], MD      Allergies    Latex, Tape, and Shellfish allergy    Review of Systems   Review of Systems  Constitutional:  Negative for fever.  Respiratory:  Negative for cough and shortness of breath.   Gastrointestinal:  Positive for abdominal pain. Negative for  diarrhea, nausea and vomiting.  Genitourinary:  Negative for difficulty urinating, dysuria, frequency, vaginal bleeding and vaginal discharge.  Neurological:  Negative for syncope.   Physical Exam Updated Vital Signs BP 116/89   Pulse (!) 107   Temp 98.9 F (37.2 C) (Oral)   Resp 16   SpO2 100%  Physical Exam Vitals and nursing note reviewed. Exam conducted with a chaperone present.  Constitutional:      General: She is not in acute distress.    Appearance: Normal appearance. She is not ill-appearing, toxic-appearing or diaphoretic.  HENT:     Head: Normocephalic and atraumatic.     Right Ear: External ear normal.     Left Ear: External ear normal.     Nose: Nose normal.     Mouth/Throat:     Mouth: Mucous membranes are moist.     Pharynx: Oropharynx is clear.  Eyes:     General: No scleral icterus. Cardiovascular:     Rate and Rhythm: Normal rate and regular rhythm.     Heart sounds: Normal heart sounds. No murmur heard.   No friction rub. No gallop.  Pulmonary:     Effort: Pulmonary effort is normal. No respiratory distress.     Breath sounds: Normal breath sounds. No stridor. No wheezing, rhonchi or rales.  Abdominal:     General: There is no distension.     Palpations: Abdomen is soft.     Tenderness: There is no abdominal tenderness. There is no guarding or rebound.  Genitourinary:    Labia:        Right: No rash.        Left: No rash.      Vagina: No foreign body. No erythema or bleeding.     Cervix: No lesion.     Uterus: Normal.      Adnexa: Right adnexa normal and left adnexa normal.       Right: No tenderness or fullness.         Left: No tenderness or fullness.       Comments: Scant amount of clear to white discharge. Cervical os is closed. Small amount of scant bloody discharge from the cervical os. Musculoskeletal:        General: No deformity.     Cervical back: Neck supple.  Skin:    General: Skin is warm and dry.  Neurological:     General: No  focal deficit present.     Mental Status: She is alert and oriented to person, place, and time.    ED Results / Procedures / Treatments   Labs (all labs ordered are listed, but only abnormal results are displayed) Labs Reviewed  CBG MONITORING, ED - Abnormal; Notable for the following components:      Result Value   Glucose-Capillary 446 (*)    All other components within normal limits  BETA-HYDROXYBUTYRIC ACID  URINALYSIS, ROUTINE W REFLEX MICROSCOPIC  CBC WITH DIFFERENTIAL/PLATELET  COMPREHENSIVE METABOLIC PANEL  I-STAT VENOUS BLOOD GAS, ED  I-STAT BETA HCG BLOOD, ED ([facility], [facility], [facility] ONLY)    EKG None  Radiology No results found.  Procedures Procedures  {Document cardiac monitor, telemetry assessment procedure when appropriate:1}  Medications Ordered in ED Medications  lactated ringers bolus 1,000 mL (has no administration in time range)    ED Course/ Medical Decision Making/ A&P                           Medical Decision Making Risk Prescription drug management.    Carliss Blatt is a 21 y.o. female with a history of type 1 diabetes and multiple episodes of DKA, depression, asthma presenting to the ED with lower abdominal pain and hyperglycemia.  On exam, the patient is afebrile and hemodynamically stable.  Her abdomen is soft and nontender to palpation in all quadrants.  On pelvic exam, she has a very small amount of bleeding from the cervical os, but no other abnormalities.  Bilateral adnexa without tenderness or fullness.  Beta-hCG is negative.  Given her normal abdominal exam, very low suspicion for appendicitis.  Patient has also not had any fevers, nausea, or vomiting.  Very low suspicion for ovarian torsion or other ovarian pathology given patient has no adnexal tenderness or persistent severe pain.  I do not think she requires imaging at this time.  Wet prep was obtained and was within normal limits.  Patient declined gonorrhea/chlamydia testing.  For her  hyperglycemia, patient was initially in the 400s on arrival.  VBG shows a normal pH of 7.425.  Beta hydroxy is mildly elevated to 1.3, but on chart review it appears patient beta-hydroxybutyrate is typically extremely elevated when she is in DKA.  CBC is within normal limits.  CMP shows hyperglycemia but is otherwise unremarkable.  Patient not in DKA at this time.  Patient was given 1 L of IV fluid, her nighttime dose of glargine 30 units, and aspart 5 units.  On reevaluation, ***  {Document critical care time when appropriate:1} {Document review of labs and clinical decision tools ie heart score, Chads2Vasc2 etc:1}  {Document your independent review of radiology images, and any outside records:1} {Document your discussion with family members, caretakers, and with consultants:1} {Document social determinants of health affecting pt's care:1} {Document your decision making why or why not admission, treatments were needed:1} Final Clinical Impression(s) / ED Diagnoses Final diagnoses:  None    Rx / DC Orders ED Discharge Orders     None

## 2021-05-31 NOTE — ED Provider Triage Note (Signed)
Emergency Medicine Provider Triage Evaluation Note  Dawn [name] , a 21 y.o. female  was evaluated in triage.  Pt complains of RLQ abdominal pain and a missed period. She reports that she had a questionable at home positive test. She went to UC to be evaluated for that and her RLQ abdominal cramping for the past week when they discovered her BG was >500. The patient is a type 1 diabetic. She doesn't have a dexcom or a meter but is still giving herself insulin. Denies any nausea, vomiting, dysuria, or hematuria.  Review of Systems  Positive:  Negative:   Physical Exam  BP 116/89   Pulse (!) 107   Temp 98.9 F (37.2 C) (Oral)   Resp 16   SpO2 100%  Gen:   Awake, no distress   Resp:  Normal effort  MSK:   Moves extremities without difficulty  Other:  Abdomen soft with some mild RLQ and LUQ tenderness to palpation.  Medical Decision Making  Medically screening exam initiated at 9:24 PM.  Appropriate orders placed.  Enaya Howze was informed that the remainder of the evaluation will be completed by another provider, this initial triage assessment does not replace that evaluation, and the importance of remaining in the ED until their evaluation is complete.  Labs ordered will defer imaging until patient is further evaluated in the back.    Achille Rich, PA-C 05/31/21 2127

## 2021-06-01 LAB — CBG MONITORING, ED
Glucose-Capillary: 257 mg/dL — ABNORMAL HIGH (ref 70–99)
Glucose-Capillary: 410 mg/dL — ABNORMAL HIGH (ref 70–99)

## 2021-06-01 NOTE — ED Notes (Signed)
RN reviewed discharge instructions with pt. Pt verbalized understanding and had no further questions. VSS upon discharge.  

## 2021-06-02 ENCOUNTER — Telehealth (HOSPITAL_COMMUNITY): Payer: Self-pay | Admitting: Emergency Medicine

## 2021-06-02 LAB — CERVICOVAGINAL ANCILLARY ONLY
Bacterial Vaginitis (gardnerella): NEGATIVE
Candida Glabrata: POSITIVE — AB
Candida Vaginitis: POSITIVE — AB
Chlamydia: NEGATIVE
Comment: NEGATIVE
Comment: NEGATIVE
Comment: NEGATIVE
Comment: NEGATIVE
Comment: NEGATIVE
Comment: NORMAL
Neisseria Gonorrhea: NEGATIVE
Trichomonas: NEGATIVE

## 2021-06-02 MED ORDER — FLUCONAZOLE 150 MG PO TABS: 150.0000 mg | ORAL_TABLET | Freq: Once | ORAL | 0 refills | Status: AC

## 2021-06-08 ENCOUNTER — Encounter: Payer: Self-pay | Admitting: *Deleted

## 2021-06-16 ENCOUNTER — Telehealth: Payer: Self-pay | Admitting: *Deleted

## 2021-06-16 ENCOUNTER — Other Ambulatory Visit (HOSPITAL_COMMUNITY)
Admission: RE | Admit: 2021-06-16 | Discharge: 2021-06-16 | Disposition: A | Discharge status: Home / Self Care | Payer: Medicaid Other | Source: Ambulatory Visit | Attending: Family Medicine | Admitting: Family Medicine

## 2021-06-16 ENCOUNTER — Ambulatory Visit (INDEPENDENT_AMBULATORY_CARE_PROVIDER_SITE_OTHER): Payer: Medicaid Other | Admitting: Family Medicine

## 2021-06-16 ENCOUNTER — Encounter: Payer: Self-pay | Admitting: Family Medicine

## 2021-06-16 VITALS — BP 116/82 | HR 102 | Ht 62.0 in | Wt 114.8 lb

## 2021-06-16 DIAGNOSIS — N898 Other specified noninflammatory disorders of vagina: Secondary | ICD-10-CM | POA: Diagnosis present

## 2021-06-16 DIAGNOSIS — Z7251 High risk heterosexual behavior: Secondary | ICD-10-CM

## 2021-06-16 DIAGNOSIS — N926 Irregular menstruation, unspecified: Secondary | ICD-10-CM

## 2021-06-16 DIAGNOSIS — E1065 Type 1 diabetes mellitus with hyperglycemia: Secondary | ICD-10-CM | POA: Diagnosis present

## 2021-06-16 DIAGNOSIS — R1030 Lower abdominal pain, unspecified: Secondary | ICD-10-CM

## 2021-06-16 LAB — POCT WET PREP (WET MOUNT)
Clue Cells Wet Prep Whiff POC: NEGATIVE
Trichomonas Wet Prep HPF POC: ABSENT

## 2021-06-16 LAB — POCT URINE PREGNANCY: Preg Test, Ur: NEGATIVE

## 2021-06-16 LAB — GLUCOSE, POCT (MANUAL RESULT ENTRY)
POC Glucose: >600 mg/dl (ref 70–99)
POC Glucose: >600 mg/dl (ref 70–99)

## 2021-06-16 NOTE — Progress Notes (Signed)
SUBJECTIVE:   CHIEF COMPLAINT / HPI:   Dawn [name] is a 21 y.o. female presents for follow up  Missed period Pt reports missed period. LMP was April 5th. Had spotting May 10th. She usually gets periods every month and never misses a cycle. Pt is not on birth control. Was on depo for 2 year and her last dose was in Jan 23.  Pt reports she still got monthly periods on depo.  Pt is sexually active with female. Does not use condoms due to latex allergy. Partner also a latex allergy. She endorses breast tenderness for the last 3 weeks and thinks she may be pregnant.   Lower abdominal pain  She is having lower abdominal pain on the RLQ for one month. Described as a sharp pain. Intermittent in nature, also getting throbbing pain in vulva.  Has not taken any analgesia for it. Denies fevers, back pain, dysuria, frequency, urgency, hematuria, constipation, diarrhea, rectal bleeding or melena.  She reports a new milky white discharge for 3 days. Recently treated for yeast infection at urgent care.   T1DM Pt reports she is followed by adult endocrinology and no longer followed by peds endo.CBG>600 in clinic. P Just ate sweet potato pie and cotty candy at [facility]. Takes Lantus: 40 units every night, humalog 10u, 15-20u lunch, 20u evening. Pt endorses compliance. Last A1c 14.6 on 04/10/21. Denies feeling unwell, vomiting, headache, vision changes but has been feeling nauseous.   [facility] Office Visit from 06/16/2021 in [facility]  PHQ-9 Total Score 8       PERTINENT  PMH / PSH: T1DM  OBJECTIVE:   BP 116/82   Pulse (!) 102   Ht 5\' 2"  (1.575 m)   Wt 114 lb 12.8 oz (52.1 kg)   LMP 05/12/2021 Comment: states she was spotting  SpO2 100%   BMI 21.00 kg/m    General: Alert, no acute distress Cardio: well perfused  Pulm: normal work of breathing Abdomen: Bowel sounds normal. Abdomen soft, mildly tender in RLQ, no guarding. Neuro: Cranial nerves grossly intact    Pelvic Exam chaperoned by CMA Jessica          External: normal female genitalia without lesions or masses        Vagina: normal without lesions or masses        Cervix: normal without lesions or masses        Samples for Wet prep, GC/Chlamydia obtained   ASSESSMENT/PLAN:   Uncontrolled type 1 diabetes mellitus with hyperglycemia (HCC) CBG >600 today in clinic likely in the setting of uncontrolled T1DM. Pt had not yet taken her insulin for this afternoon and had eaten cotton candy and a sweet potato pie for lunch. Pt had her insulin in her bag and I asked her to administer her usual dose of insulin. She administered 25U humalog. Rechecked within 1 hour and CBG>600. Obtained stat BMP today and notified night team of CBG levels.Would like to rule out DKA given recurrent admissions with DKA.  ER precautions given to pt.  Pt provided part of dexcom meter which she was missing from Dr Valentina Lucks today. Recommend close follow up with PCP and endocrinology for stricter glucose control.   Unprotected sexual intercourse Strongly recommend birth control as pt is not on birth control and does not use condoms due to latex allergy. Explained to pt that it would be very unsafe for both her and a fetus if she was to get pregnant  due to the risk of complications with her uncontrolled diabetes, pt was in agreement. U preg negative. Recommended depo today however pt declined. Pt can purchase latex free condoms in the meantime. Obtained GC/chlamydia and wet prep today for STD testing.   Lower abdominal pain Pt with RLQ pain for the last month. Broad differentials including DKA, constipation, cystitis/pyelonephritis, PID, renal colic, ovarian pathology such as cyst etc. Pt has similar pain in March 23, found to have acute pyelonephritis on MR imaging, pt was adequately treated with cipro. Pt without CVA tenderness, fevers and urinary sx so less likely to be pyelonephritis today. UA obtained-sent as a "send out" as error.  Considered ectopic pregnancy given pt is sexually active and not on birth control however upreg negative. Ovarian torsion less likely given mild severity of sx. Low suspicion for appendicitis given chronicity of sx. Will obtain TV US to rule out pelvic pathology. Recommend follow up with PCP for results.   Menstrual periods irregular Unclear cause. Could be due to pt recently coming off depo in January, can take up to one year to regulate after depo. U preg negative today. No hx of PCOS. Uncontrolled diabetes could be contributing. Obtained general lab work today TSH, CBC, BMP, prolactin. Continue to monitor menstrual cycles. Follow up with PCP if pt has not had a cycle in 2-3 months, would consider starting OCPs.    Lattie Haw, MD PGY-3 [facility]

## 2021-06-16 NOTE — Patient Instructions (Signed)
Thank you for coming to see me today. It was a pleasure. Today we discussed your missed period, you are not pregnant which is reassuring however I do recommended a long term form of birth control. Your cycles can take up to 1 year to regulate after coming off depo. Please monitor your cycles for the next few months. If they have not returned then come back. I recommend some basic lab work.  Not sure what the lower abdominal pain is. Lets do a transvaginal ultrasound to check your ovaries.   Please follow-up with PCP as needed  If you have any questions or concerns, please do not hesitate to call the office at [phone].  Best wishes,   Dr Allena Katz

## 2021-06-17 ENCOUNTER — Telehealth: Payer: Self-pay | Admitting: Family Medicine

## 2021-06-17 ENCOUNTER — Encounter: Payer: Self-pay | Admitting: Family Medicine

## 2021-06-17 DIAGNOSIS — N926 Irregular menstruation, unspecified: Secondary | ICD-10-CM | POA: Insufficient documentation

## 2021-06-17 LAB — BASIC METABOLIC PANEL
BUN/Creatinine Ratio: 15 (ref 9–23)
BUN: 15 mg/dL (ref 6–20)
CO2: 18 mmol/L — ABNORMAL LOW (ref 20–29)
Calcium: 9.6 mg/dL (ref 8.7–10.2)
Chloride: 95 mmol/L — ABNORMAL LOW (ref 96–106)
Creatinine, Ser: 0.97 mg/dL (ref 0.57–1.00)
Glucose: 757 mg/dL (ref 70–99)
Potassium: 4.6 mmol/L (ref 3.5–5.2)
Sodium: 133 mmol/L — ABNORMAL LOW (ref 134–144)
eGFR: 86 mL/min/{1.73_m2} (ref >59)

## 2021-06-17 LAB — URINALYSIS
Bilirubin, UA: NEGATIVE
Leukocytes,UA: NEGATIVE
Nitrite, UA: NEGATIVE
Protein,UA: NEGATIVE
RBC, UA: NEGATIVE
Specific Gravity, UA: >=1.03 — AB (ref 1.005–1.030)
Urobilinogen, Ur: 0.2 mg/dL (ref 0.2–1.0)
pH, UA: 7 (ref 5.0–7.5)

## 2021-06-17 LAB — CERVICOVAGINAL ANCILLARY ONLY
Chlamydia: NEGATIVE
Comment: NEGATIVE
Comment: NORMAL
Neisseria Gonorrhea: NEGATIVE

## 2021-06-17 LAB — CBC
Hematocrit: 37.4 % (ref 34.0–46.6)
Hemoglobin: 11.4 g/dL (ref 11.1–15.9)
MCH: 27.9 pg (ref 26.6–33.0)
MCHC: 30.5 g/dL — ABNORMAL LOW (ref 31.5–35.7)
MCV: 91 fL (ref 79–97)
Platelets: 229 10*3/uL (ref 150–450)
RBC: 4.09 x10E6/uL (ref 3.77–5.28)
RDW: 13.9 % (ref 11.7–15.4)
WBC: 5.9 10*3/uL (ref 3.4–10.8)

## 2021-06-17 LAB — TSH: TSH: 0.592 u[IU]/mL (ref 0.450–4.500)

## 2021-06-17 LAB — PROLACTIN: Prolactin: 5 ng/mL (ref 4.8–23.3)

## 2021-06-17 NOTE — Assessment & Plan Note (Addendum)
CBG >600 today in clinic likely in the setting of uncontrolled T1DM. Pt had not yet taken her insulin for this afternoon and had eaten cotton candy and a sweet potato pie for lunch. Pt had her insulin in her bag and I asked her to administer her usual dose of insulin. She administered 25U humalog. Rechecked within 1 hour and CBG>600. Obtained stat BMP today and notified night team of CBG levels.Would like to rule out DKA given recurrent admissions with DKA.  ER precautions given to pt.  Pt provided part of dexcom meter which she was missing from Dr Raymondo Band today. Recommend close follow up with PCP and endocrinology for stricter glucose control.

## 2021-06-17 NOTE — Assessment & Plan Note (Addendum)
Strongly recommend birth control as pt is not on birth control and does not use condoms due to latex allergy. Explained to pt that it would be very unsafe for both her and a fetus if she was to get pregnant due to the risk of complications with her uncontrolled diabetes, pt was in agreement. U preg negative. Recommended depo today however pt declined. Pt can purchase latex free condoms in the meantime. Obtained GC/chlamydia and wet prep today for STD testing.

## 2021-06-17 NOTE — Assessment & Plan Note (Addendum)
Pt with RLQ pain for the last month. Broad differentials including DKA, constipation, cystitis/pyelonephritis, PID, renal colic, ovarian pathology such as cyst etc. Pt has similar pain in March 23, found to have acute pyelonephritis on MR imaging, pt was adequately treated with cipro. Pt without CVA tenderness, fevers and urinary sx so less likely to be pyelonephritis today. UA obtained-sent as a "send out" as error. Considered ectopic pregnancy given pt is sexually active and not on birth control however upreg negative. Ovarian torsion less likely given mild severity of sx. Low suspicion for appendicitis given chronicity of sx. Will obtain TV US to rule out pelvic pathology. Recommend follow up with PCP for results.

## 2021-06-17 NOTE — Telephone Encounter (Signed)
Received page with critical lab report.   Glucose 757 BMP Na 133, Cl 95, K 4.6, HCO3 18, Cr 0.97.  Calculated AG to be 20.  Patient needs to come to the ED to be evaluated for DKA.  Called mobile number, not in service. I was able to get a hold of her Dawn [name], legal guardian, who will call her and tell her to go to the ED (she is at her mom's house tonight).  [name], MD [facility], PGY-3

## 2021-06-17 NOTE — Telephone Encounter (Signed)
Received phone call from [facility] with critical result. They were calling to inform provider that they verified glucose level at 708. Per chart review, current plan is for patient to be evaluated in the ED.   Veronda Prude, RN

## 2021-06-17 NOTE — Assessment & Plan Note (Signed)
Unclear cause. Could be due to pt recently coming off depo in January, can take up to one year to regulate after depo. U preg negative today. No hx of PCOS. Uncontrolled diabetes could be contributing. Obtained general lab work today TSH, CBC, BMP, prolactin. Continue to monitor menstrual cycles. Follow up with PCP if pt has not had a cycle in 2-3 months, would consider starting OCPs.

## 2021-06-18 LAB — BASIC METABOLIC PANEL
BUN/Creatinine Ratio: 15 (ref 9–23)
BUN: 15 mg/dL (ref 6–20)
CO2: 21 mmol/L (ref 20–29)
Calcium: 9.3 mg/dL (ref 8.7–10.2)
Chloride: 95 mmol/L — ABNORMAL LOW (ref 96–106)
Creatinine, Ser: 0.98 mg/dL (ref 0.57–1.00)
Glucose: 708 mg/dL (ref 70–99)
Potassium: 4.4 mmol/L (ref 3.5–5.2)
Sodium: 134 mmol/L (ref 134–144)
eGFR: 85 mL/min/{1.73_m2} (ref >59)

## 2021-06-18 LAB — URINE CULTURE

## 2021-06-18 NOTE — Telephone Encounter (Signed)
My chart message sent to pt.Dawn [name], CMA

## 2021-06-23 ENCOUNTER — Ambulatory Visit (HOSPITAL_COMMUNITY): Payer: Medicaid Other

## 2021-06-30 ENCOUNTER — Ambulatory Visit
Admission: EM | Admit: 2021-06-30 | Discharge: 2021-06-30 | Disposition: A | Discharge status: Home / Self Care | Payer: Medicaid Other | Attending: Family Medicine | Admitting: Family Medicine

## 2021-06-30 DIAGNOSIS — Z113 Encounter for screening for infections with a predominantly sexual mode of transmission: Secondary | ICD-10-CM | POA: Diagnosis not present

## 2021-06-30 DIAGNOSIS — Z202 Contact with and (suspected) exposure to infections with a predominantly sexual mode of transmission: Secondary | ICD-10-CM | POA: Diagnosis not present

## 2021-06-30 MED ORDER — DOXYCYCLINE HYCLATE 100 MG PO CAPS: 100.0000 mg | ORAL_CAPSULE | Freq: Two times a day (BID) | ORAL | 0 refills | Status: DC

## 2021-06-30 MED ORDER — CEFTRIAXONE SODIUM 500 MG IJ SOLR
500.0000 mg | Freq: Once | INTRAMUSCULAR | Status: AC
  Administered 2021-06-30: 500 mg via INTRAMUSCULAR

## 2021-06-30 NOTE — Discharge Instructions (Addendum)
Staff will notify you if there are any positives on your swab.  You have been given a shot of ceftriaxone here in the office.  This is for possible gonorrhea  Take doxycycline 100 mg--1 capsule 2 times a day for 7 days.  This is for possible chlamydia.  Rest from intercourse for 2 weeks after treatment.
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
## 2021-06-30 NOTE — ED Triage Notes (Signed)
Sti screening states sex partner reported a positive sti result but would not disclose the nature of it.

## 2021-07-02 ENCOUNTER — Telehealth (HOSPITAL_COMMUNITY): Payer: Self-pay | Admitting: Emergency Medicine

## 2021-07-02 LAB — CERVICOVAGINAL ANCILLARY ONLY
Bacterial Vaginitis (gardnerella): POSITIVE — AB
Candida Glabrata: POSITIVE — AB
Candida Vaginitis: POSITIVE — AB
Chlamydia: NEGATIVE
Comment: NEGATIVE
Comment: NEGATIVE
Comment: NEGATIVE
Comment: NEGATIVE
Comment: NEGATIVE
Comment: NORMAL
Neisseria Gonorrhea: NEGATIVE
Trichomonas: NEGATIVE

## 2021-07-02 LAB — HIV ANTIBODY (ROUTINE TESTING W REFLEX): HIV Screen 4th Generation wRfx: NONREACTIVE

## 2021-07-02 LAB — RPR: RPR Ser Ql: NONREACTIVE

## 2021-07-02 MED ORDER — METRONIDAZOLE 500 MG PO TABS: 500.0000 mg | ORAL_TABLET | Freq: Two times a day (BID) | ORAL | 0 refills | Status: DC

## 2021-07-02 MED ORDER — FLUCONAZOLE 150 MG PO TABS: 150.0000 mg | ORAL_TABLET | Freq: Once | ORAL | 0 refills | Status: AC

## 2021-07-04 ENCOUNTER — Inpatient Hospital Stay (HOSPITAL_COMMUNITY)
Admission: EM | Admit: 2021-07-04 | Discharge: 2021-07-06 | DRG: 637 | Disposition: A | Discharge status: Home / Self Care | Payer: Medicaid Other | Attending: Student | Admitting: Student

## 2021-07-04 ENCOUNTER — Other Ambulatory Visit: Payer: Self-pay

## 2021-07-04 ENCOUNTER — Emergency Department (HOSPITAL_COMMUNITY): Payer: Medicaid Other

## 2021-07-04 ENCOUNTER — Encounter (HOSPITAL_COMMUNITY): Payer: Self-pay

## 2021-07-04 DIAGNOSIS — F909 Attention-deficit hyperactivity disorder, unspecified type: Secondary | ICD-10-CM | POA: Diagnosis present

## 2021-07-04 DIAGNOSIS — B3731 Acute candidiasis of vulva and vagina: Secondary | ICD-10-CM | POA: Diagnosis present

## 2021-07-04 DIAGNOSIS — F1090 Alcohol use, unspecified, uncomplicated: Secondary | ICD-10-CM | POA: Diagnosis present

## 2021-07-04 DIAGNOSIS — Z8249 Family history of ischemic heart disease and other diseases of the circulatory system: Secondary | ICD-10-CM

## 2021-07-04 DIAGNOSIS — G43909 Migraine, unspecified, not intractable, without status migrainosus: Secondary | ICD-10-CM | POA: Diagnosis present

## 2021-07-04 DIAGNOSIS — Z8616 Personal history of COVID-19: Secondary | ICD-10-CM | POA: Diagnosis not present

## 2021-07-04 DIAGNOSIS — F329 Major depressive disorder, single episode, unspecified: Secondary | ICD-10-CM | POA: Diagnosis present

## 2021-07-04 DIAGNOSIS — Z833 Family history of diabetes mellitus: Secondary | ICD-10-CM | POA: Diagnosis not present

## 2021-07-04 DIAGNOSIS — Z91148 Patient's other noncompliance with medication regimen for other reason: Secondary | ICD-10-CM | POA: Diagnosis not present

## 2021-07-04 DIAGNOSIS — Z91040 Latex allergy status: Secondary | ICD-10-CM | POA: Diagnosis not present

## 2021-07-04 DIAGNOSIS — G9341 Metabolic encephalopathy: Secondary | ICD-10-CM | POA: Diagnosis present

## 2021-07-04 DIAGNOSIS — Z91013 Allergy to seafood: Secondary | ICD-10-CM | POA: Diagnosis not present

## 2021-07-04 DIAGNOSIS — Z91048 Other nonmedicinal substance allergy status: Secondary | ICD-10-CM

## 2021-07-04 DIAGNOSIS — E1011 Type 1 diabetes mellitus with ketoacidosis with coma: Secondary | ICD-10-CM

## 2021-07-04 DIAGNOSIS — Z888 Allergy status to other drugs, medicaments and biological substances status: Secondary | ICD-10-CM

## 2021-07-04 DIAGNOSIS — E1110 Type 2 diabetes mellitus with ketoacidosis without coma: Secondary | ICD-10-CM | POA: Diagnosis present

## 2021-07-04 DIAGNOSIS — E1010 Type 1 diabetes mellitus with ketoacidosis without coma: Principal | ICD-10-CM | POA: Diagnosis present

## 2021-07-04 DIAGNOSIS — N179 Acute kidney failure, unspecified: Secondary | ICD-10-CM | POA: Diagnosis present

## 2021-07-04 DIAGNOSIS — Z825 Family history of asthma and other chronic lower respiratory diseases: Secondary | ICD-10-CM

## 2021-07-04 DIAGNOSIS — Z20822 Contact with and (suspected) exposure to covid-19: Secondary | ICD-10-CM | POA: Diagnosis present

## 2021-07-04 DIAGNOSIS — Z794 Long term (current) use of insulin: Secondary | ICD-10-CM

## 2021-07-04 DIAGNOSIS — E875 Hyperkalemia: Secondary | ICD-10-CM | POA: Diagnosis present

## 2021-07-04 DIAGNOSIS — J4520 Mild intermittent asthma, uncomplicated: Secondary | ICD-10-CM | POA: Diagnosis present

## 2021-07-04 LAB — BASIC METABOLIC PANEL
Anion gap: 17 — ABNORMAL HIGH (ref 5–15)
BUN: 43 mg/dL — ABNORMAL HIGH (ref 6–20)
BUN: 43 mg/dL — ABNORMAL HIGH (ref 6–20)
BUN: 40 mg/dL — ABNORMAL HIGH (ref 6–20)
BUN: 23 mg/dL — ABNORMAL HIGH (ref 6–20)
CO2: <7 mmol/L — ABNORMAL LOW (ref 22–32)
CO2: <7 mmol/L — ABNORMAL LOW (ref 22–32)
CO2: <7 mmol/L — ABNORMAL LOW (ref 22–32)
CO2: 13 mmol/L — ABNORMAL LOW (ref 22–32)
Calcium: 10.1 mg/dL (ref 8.9–10.3)
Calcium: 9.9 mg/dL (ref 8.9–10.3)
Calcium: 10.4 mg/dL — ABNORMAL HIGH (ref 8.9–10.3)
Calcium: 9.2 mg/dL (ref 8.9–10.3)
Chloride: 98 mmol/L (ref 98–111)
Chloride: 102 mmol/L (ref 98–111)
Chloride: 110 mmol/L (ref 98–111)
Chloride: 116 mmol/L — ABNORMAL HIGH (ref 98–111)
Creatinine, Ser: 1.55 mg/dL — ABNORMAL HIGH (ref 0.44–1.00)
Creatinine, Ser: 1.68 mg/dL — ABNORMAL HIGH (ref 0.44–1.00)
Creatinine, Ser: 1.36 mg/dL — ABNORMAL HIGH (ref 0.44–1.00)
Creatinine, Ser: 0.93 mg/dL (ref 0.44–1.00)
GFR, Estimated: 49 mL/min — ABNORMAL LOW (ref >60)
GFR, Estimated: 44 mL/min — ABNORMAL LOW (ref >60)
GFR, Estimated: 57 mL/min — ABNORMAL LOW (ref >60)
GFR, Estimated: >60 mL/min (ref >60)
Glucose, Bld: 1062 mg/dL (ref 70–99)
Glucose, Bld: 998 mg/dL (ref 70–99)
Glucose, Bld: 410 mg/dL — ABNORMAL HIGH (ref 70–99)
Glucose, Bld: 190 mg/dL — ABNORMAL HIGH (ref 70–99)
Potassium: 6.1 mmol/L — ABNORMAL HIGH (ref 3.5–5.1)
Potassium: 6.9 mmol/L (ref 3.5–5.1)
Potassium: 5.3 mmol/L — ABNORMAL HIGH (ref 3.5–5.1)
Potassium: 4.5 mmol/L (ref 3.5–5.1)
Sodium: 137 mmol/L (ref 135–145)
Sodium: 140 mmol/L (ref 135–145)
Sodium: 146 mmol/L — ABNORMAL HIGH (ref 135–145)
Sodium: 146 mmol/L — ABNORMAL HIGH (ref 135–145)

## 2021-07-04 LAB — MAGNESIUM
Magnesium: 3.5 mg/dL — ABNORMAL HIGH (ref 1.7–2.4)
Magnesium: 3.3 mg/dL — ABNORMAL HIGH (ref 1.7–2.4)

## 2021-07-04 LAB — HEPATIC FUNCTION PANEL
ALT: 31 U/L (ref 0–44)
AST: 45 U/L — ABNORMAL HIGH (ref 15–41)
Albumin: 4.7 g/dL (ref 3.5–5.0)
Alkaline Phosphatase: 188 U/L — ABNORMAL HIGH (ref 38–126)
Bilirubin, Direct: <0.1 mg/dL (ref 0.0–0.2)
Total Bilirubin: 2 mg/dL — ABNORMAL HIGH (ref 0.3–1.2)
Total Protein: 10 g/dL — ABNORMAL HIGH (ref 6.5–8.1)

## 2021-07-04 LAB — GLUCOSE, CAPILLARY
Glucose-Capillary: 175 mg/dL — ABNORMAL HIGH (ref 70–99)
Glucose-Capillary: 194 mg/dL — ABNORMAL HIGH (ref 70–99)
Glucose-Capillary: 199 mg/dL — ABNORMAL HIGH (ref 70–99)
Glucose-Capillary: 202 mg/dL — ABNORMAL HIGH (ref 70–99)
Glucose-Capillary: 211 mg/dL — ABNORMAL HIGH (ref 70–99)
Glucose-Capillary: 231 mg/dL — ABNORMAL HIGH (ref 70–99)
Glucose-Capillary: 273 mg/dL — ABNORMAL HIGH (ref 70–99)
Glucose-Capillary: 384 mg/dL — ABNORMAL HIGH (ref 70–99)
Glucose-Capillary: 446 mg/dL — ABNORMAL HIGH (ref 70–99)
Glucose-Capillary: 520 mg/dL (ref 70–99)
Glucose-Capillary: >600 mg/dL (ref 70–99)

## 2021-07-04 LAB — URINALYSIS, ROUTINE W REFLEX MICROSCOPIC
Bacteria, UA: NONE SEEN
Bilirubin Urine: NEGATIVE
Glucose, UA: >=500 mg/dL — AB
Ketones, ur: 80 mg/dL — AB
Leukocytes,Ua: NEGATIVE
Nitrite: NEGATIVE
Protein, ur: 30 mg/dL — AB
Specific Gravity, Urine: 1.02 (ref 1.005–1.030)
pH: 5 (ref 5.0–8.0)

## 2021-07-04 LAB — BLOOD GAS, VENOUS
Acid-base deficit: 28.8 mmol/L — ABNORMAL HIGH (ref 0.0–2.0)
Bicarbonate: 4.3 mmol/L — ABNORMAL LOW (ref 20.0–28.0)
O2 Saturation: 69.8 %
Patient temperature: 37
pCO2, Ven: 25 mmHg — ABNORMAL LOW (ref 44–60)
pH, Ven: <6.95 — CL (ref 7.25–7.43)
pO2, Ven: 49 mmHg — ABNORMAL HIGH (ref 32–45)

## 2021-07-04 LAB — I-STAT CHEM 8, ED
BUN: 42 mg/dL — ABNORMAL HIGH (ref 6–20)
Calcium, Ion: 1.25 mmol/L (ref 1.15–1.40)
Chloride: 108 mmol/L (ref 98–111)
Creatinine, Ser: 0.9 mg/dL (ref 0.44–1.00)
Glucose, Bld: >700 mg/dL (ref 70–99)
HCT: 47 % — ABNORMAL HIGH (ref 36.0–46.0)
Hemoglobin: 16 g/dL — ABNORMAL HIGH (ref 12.0–15.0)
Potassium: 6.1 mmol/L — ABNORMAL HIGH (ref 3.5–5.1)
Sodium: 135 mmol/L (ref 135–145)
TCO2: 6 mmol/L — ABNORMAL LOW (ref 22–32)

## 2021-07-04 LAB — CBC
HCT: 48.5 % — ABNORMAL HIGH (ref 36.0–46.0)
Hemoglobin: 13.7 g/dL (ref 12.0–15.0)
MCH: 27.6 pg (ref 26.0–34.0)
MCHC: 28.2 g/dL — ABNORMAL LOW (ref 30.0–36.0)
MCV: 97.6 fL (ref 80.0–100.0)
Platelets: 465 10*3/uL — ABNORMAL HIGH (ref 150–400)
RBC: 4.97 MIL/uL (ref 3.87–5.11)
RDW: 15 % (ref 11.5–15.5)
WBC: 16.8 10*3/uL — ABNORMAL HIGH (ref 4.0–10.5)
nRBC: 0 % (ref 0.0–0.2)

## 2021-07-04 LAB — I-STAT BETA HCG BLOOD, ED (MC, WL, AP ONLY): I-stat hCG, quantitative: <5 m[IU]/mL (ref <5)

## 2021-07-04 LAB — PHOSPHORUS
Phosphorus: 11.3 mg/dL — ABNORMAL HIGH (ref 2.5–4.6)
Phosphorus: 6.6 mg/dL — ABNORMAL HIGH (ref 2.5–4.6)

## 2021-07-04 LAB — CBG MONITORING, ED
Glucose-Capillary: >600 mg/dL (ref 70–99)
Glucose-Capillary: >600 mg/dL (ref 70–99)

## 2021-07-04 LAB — BETA-HYDROXYBUTYRIC ACID
Beta-Hydroxybutyric Acid: >8 mmol/L — ABNORMAL HIGH (ref 0.05–0.27)
Beta-Hydroxybutyric Acid: >8 mmol/L — ABNORMAL HIGH (ref 0.05–0.27)

## 2021-07-04 MED ORDER — DEXTROSE IN LACTATED RINGERS 5 % IV SOLN: INTRAVENOUS | Status: DC

## 2021-07-04 MED ORDER — DEXTROSE 50 % IV SOLN: 0.0000 mL | INTRAVENOUS | Status: DC | PRN

## 2021-07-04 MED ORDER — POLYETHYLENE GLYCOL 3350 17 G PO PACK: 17.0000 g | PACK | Freq: Every day | ORAL | Status: DC | PRN

## 2021-07-04 MED ORDER — ENOXAPARIN SODIUM 40 MG/0.4ML IJ SOSY: 40.0000 mg | PREFILLED_SYRINGE | INTRAMUSCULAR | Status: DC

## 2021-07-04 MED ORDER — METRONIDAZOLE 500 MG/100ML IV SOLN
500.0000 mg | Freq: Two times a day (BID) | INTRAVENOUS | Status: DC
  Administered 2021-07-04 – 2021-07-06 (×4): 500 mg via INTRAVENOUS
  Filled 2021-07-04 (×4): qty 100

## 2021-07-04 MED ORDER — LACTATED RINGERS IV SOLN: INTRAVENOUS | Status: DC

## 2021-07-04 MED ORDER — INSULIN REGULAR(HUMAN) IN NACL 100-0.9 UT/100ML-% IV SOLN
INTRAVENOUS | Status: AC
Start: 2021-07-04 — End: 2021-07-05
  Administered 2021-07-04: 7.5 [IU]/h via INTRAVENOUS
  Filled 2021-07-04 (×3): qty 100

## 2021-07-04 MED ORDER — LACTATED RINGERS IV BOLUS
1000.0000 mL | Freq: Once | INTRAVENOUS | Status: AC
  Administered 2021-07-04: 1000 mL via INTRAVENOUS

## 2021-07-04 MED ORDER — LACTATED RINGERS IV BOLUS
1000.0000 mL | Freq: Once | INTRAVENOUS | Status: AC
Start: 2021-07-04 — End: 2021-07-04
  Administered 2021-07-04: 1000 mL via INTRAVENOUS

## 2021-07-04 MED ORDER — FLUCONAZOLE IN SODIUM CHLORIDE 200-0.9 MG/100ML-% IV SOLN
200.0000 mg | INTRAVENOUS | Status: AC
  Administered 2021-07-04 – 2021-07-05 (×2): 200 mg via INTRAVENOUS
  Filled 2021-07-04 (×2): qty 100

## 2021-07-04 MED ORDER — DOCUSATE SODIUM 100 MG PO CAPS: 100.0000 mg | ORAL_CAPSULE | Freq: Two times a day (BID) | ORAL | Status: DC | PRN

## 2021-07-04 NOTE — ED Triage Notes (Signed)
Per EMS, patient from home N/V last night with generalized weakness today. CBG HIGH with EMS. Reports taking insulin last night.  BP 110/60 HR 120 RR 40

## 2021-07-04 NOTE — ED Provider Notes (Signed)
[facility] DEPT Provider Note   CSN: [id number] Arrival date & time: 07/04/21  1059     History  Chief Complaint  Patient presents with   Hyperglycemia    Dawn [name] is a 21 y.o. female.   Hyperglycemia Patient is a 21 year old female with past medical history significant for DM1 and DKA  Seems that she has a history of medication noncompliance.  Her friend at bedside tells me that she seemed like she was more sick and confused last night.  She stated over with her friend and this morning she was nauseous vomiting and seemed more confused and lethargic.  Friend called 911 and EMS transported patient with CBG reading high.  Patient originally not able to provide any information other than states that she has no pain Level 5 caveat due to altered mental status    Home Medications Prior to Admission medications   Medication Sig Start Date End Date Taking? Authorizing Provider  Accu-Chek Softclix Lancets lancets Please use to check blood sugar up to 6 times/day. Patient not taking: Reported on 05/31/2021 04/11/21   Erskine Emery, MD  acetaminophen (TYLENOL) 500 MG tablet Take 1,000 mg by mouth every 6 (six) hours as needed for moderate pain or headache.    [provider]  albuterol (VENTOLIN HFA) 108 (90 Base) MCG/ACT inhaler Inhale 2 puffs into the lungs every 6 (six) hours as needed for wheezing or shortness of breath. Patient not taking: Reported on 05/31/2021 03/24/21   [name], MD  Continuous Blood Gluc Transmit (DEXCOM G6 TRANSMITTER) [place]  05/26/21   [provider]  doxycycline (VIBRAMYCIN) 100 MG capsule Take 1 capsule (100 mg total) by mouth 2 (two) times daily for 7 days. 06/30/21 07/07/21  Barrett Henle, MD  Glucagon (BAQSIMI ONE PACK) 3 MG/DOSE POWD Place 3 mg into the nose once as needed (low blood sugar).    [provider]  glucose blood (ACCU-CHEK GUIDE) test strip Use to check glucose 6x daily Patient  not taking: Reported on 05/31/2021 04/11/21   Erskine Emery, MD  ibuprofen (ADVIL) 200 MG tablet Take 400 mg by mouth every 6 (six) hours as needed for headache or moderate pain.    [provider]  insulin glargine (LANTUS SOLOSTAR) 100 UNIT/ML Solostar Pen Inject 30 Units into the skin at bedtime. 03/11/21   Dameron, Luna Fuse, DO  insulin lispro (HUMALOG KWIKPEN) 100 UNIT/ML KwikPen Inject 5 Units into the skin with breakfast, with lunch, and with evening meal. Patient taking differently: Inject 5-15 Units into the skin See admin instructions. 3 times daily with meals- per sliding scale based on carb count 03/11/21 06/12/21  Dameron, Luna Fuse, DO  Insulin Pen Needle (BD PEN NEEDLE NANO 2ND GEN) 32G X 4 MM MISC USE TO INJECT INSULIN 6 TIMES DAILY 04/11/21   Erskine Emery, MD  metroNIDAZOLE (FLAGYL) 500 MG tablet Take 1 tablet (500 mg total) by mouth 2 (two) times daily. 07/02/21   Chase Picket, MD  sertraline (ZOLOFT) 50 MG tablet Take 1.5 tablets (75 mg total) by mouth daily. 03/11/21   Dameron, Luna Fuse, DO  SUMAtriptan (IMITREX) 25 MG tablet Take 0.5 tablets (12.5 mg total) by mouth every 2 (two) hours as needed for migraine. May repeat in 2 hours if headache persists or recurs. Patient taking differently: Take 25 mg by mouth every 2 (two) hours as needed for migraine. May repeat in 2 hours if headache persists or recurs. 04/12/21   [name], MD  Allergies    Latex, Tape, and Shellfish allergy    Review of Systems   Review of Systems  Physical Exam Updated Vital Signs BP (!) 79/12 (BP Location: Left Wrist)   Pulse (!) 116   Temp (!) 97.3 F (36.3 C) (Oral)   Resp (!) 30   Ht 5\' 2"  (1.575 m)   Wt 45.7 kg   LMP 05/21/2021 (Approximate) Comment: states she was spotting  SpO2 99%   BMI 18.43 kg/m  Physical Exam Vitals and nursing note reviewed.  Constitutional:      General: She is in acute distress.     Appearance: She is ill-appearing.     Comments: Cachectic  HENT:      Head: Normocephalic and atraumatic.     Nose: Nose normal.     Mouth/Throat:     Mouth: Mucous membranes are dry.  Eyes:     General: No scleral icterus. Cardiovascular:     Rate and Rhythm: Normal rate and regular rhythm.     Pulses: Normal pulses.     Heart sounds: Normal heart sounds.  Pulmonary:     Effort: No respiratory distress.     Breath sounds: No wheezing.     Comments: Kussmaul breathing Abdominal:     Palpations: Abdomen is soft.     Tenderness: There is no abdominal tenderness. There is no guarding or rebound.  Musculoskeletal:     Cervical back: Normal range of motion.     Right lower leg: No edema.     Left lower leg: No edema.  Skin:    General: Skin is warm and dry.     Capillary Refill: Capillary refill takes less than 2 seconds.  Neurological:     Mental Status: She is alert.     Comments: Patient occasionally answers questions yes or no by shaking head but is not answering questions verbally.  She tracks examiner with eyes.  She is fidgety and ill-appearing  Moves all 4 extremities but very weak.  Psychiatric:        Mood and Affect: Mood normal.        Behavior: Behavior normal.     ED Results / Procedures / Treatments   Labs (all labs ordered are listed, but only abnormal results are displayed) Labs Reviewed  BASIC METABOLIC PANEL - Abnormal; Notable for the following components:      Result Value   Potassium 6.1 (*)    CO2 <7 (*)    Glucose, Bld 1,062 (*)    BUN 43 (*)    Creatinine, Ser 1.55 (*)    GFR, Estimated 49 (*)    All other components within normal limits  CBC - Abnormal; Notable for the following components:   WBC 16.8 (*)    HCT 48.5 (*)    MCHC 28.2 (*)    Platelets 465 (*)    All other components within normal limits  URINALYSIS, ROUTINE W REFLEX MICROSCOPIC - Abnormal; Notable for the following components:   APPearance HAZY (*)    Glucose, UA >=500 (*)    Hgb urine dipstick SMALL (*)    Ketones, ur 80 (*)    Protein,  ur 30 (*)    All other components within normal limits  BLOOD GAS, VENOUS - Abnormal; Notable for the following components:   pH, Ven <6.95 (*)    pCO2, Ven 25 (*)    pO2, Ven 49 (*)    Bicarbonate 4.3 (*)    Acid-base deficit 28.8 (*)  All other components within normal limits  BETA-HYDROXYBUTYRIC ACID - Abnormal; Notable for the following components:   Beta-Hydroxybutyric Acid >8.00 (*)    All other components within normal limits  BASIC METABOLIC PANEL - Abnormal; Notable for the following components:   Potassium 6.9 (*)    CO2 <7 (*)    Glucose, Bld 998 (*)    BUN 43 (*)    Creatinine, Ser 1.68 (*)    GFR, Estimated 44 (*)    All other components within normal limits  MAGNESIUM - Abnormal; Notable for the following components:   Magnesium 3.5 (*)    All other components within normal limits  PHOSPHORUS - Abnormal; Notable for the following components:   Phosphorus 11.3 (*)    All other components within normal limits  GLUCOSE, CAPILLARY - Abnormal; Notable for the following components:   Glucose-Capillary >600 (*)    All other components within normal limits  CBG MONITORING, ED - Abnormal; Notable for the following components:   Glucose-Capillary >600 (*)    All other components within normal limits  CBG MONITORING, ED - Abnormal; Notable for the following components:   Glucose-Capillary >600 (*)    All other components within normal limits  I-STAT CHEM 8, ED - Abnormal; Notable for the following components:   Potassium 6.1 (*)    BUN 42 (*)    Glucose, Bld >700 (*)    TCO2 6 (*)    Hemoglobin 16.0 (*)    HCT 47.0 (*)    All other components within normal limits  HEPATIC FUNCTION PANEL  BASIC METABOLIC PANEL  BASIC METABOLIC PANEL  BASIC METABOLIC PANEL  BETA-HYDROXYBUTYRIC ACID  MAGNESIUM  PHOSPHORUS  MAGNESIUM  PHOSPHORUS  I-STAT BETA HCG BLOOD, ED ([facility], [facility], [facility] ONLY)  CBG MONITORING, ED    EKG EKG Interpretation  Date/Time:  Sunday July 04 2021  11:58:59 EDT Ventricular Rate:  111 PR Interval:  129 QRS Duration: 88 QT Interval:  341 QTC Calculation: 464 R Axis:   41 Text Interpretation: Sinus tachycardia Multiple ventricular premature complexes Biatrial enlargement when compared to prior, sharper t waves. No STEMI Confirmed by Theda Belfast ([id number]) on 07/04/2021 12:52:47 PM  Radiology CT HEAD WO CONTRAST ( )  Result Date: 07/04/2021 CLINICAL DATA:  Hyperglycemia.  Delirium. EXAM: CT HEAD WITHOUT CONTRAST TECHNIQUE: Contiguous axial images were obtained from the base of the skull through the vertex without intravenous contrast. RADIATION DOSE REDUCTION: This exam was performed according to the departmental dose-optimization program which includes automated exposure control, adjustment of the mA and/or kV according to patient size and/or use of iterative reconstruction technique. COMPARISON:  None. FINDINGS: Brain: No evidence of acute infarction, hemorrhage, hydrocephalus, extra-axial collection or mass lesion/mass effect. Vascular: No hyperdense vessel or unexpected calcification. Skull: Normal. Negative for fracture or focal lesion. Sinuses/Orbits: Visualized globes and orbits are unremarkable. The visualized sinuses are clear. Other: None. IMPRESSION: Normal unenhanced CT scan of the brain. Electronically Signed   By: Amie Portland M.D.   On: 07/04/2021 13:51   DG Chest Portable 1 View  Result Date: 07/04/2021 CLINICAL DATA:  AMS - DKA EXAM: PORTABLE CHEST 1 VIEW COMPARISON:  April 14, 2021 FINDINGS: The cardiomediastinal silhouette is normal in contour. No pleural effusion. No pneumothorax. No acute pleuroparenchymal abnormality. Visualized abdomen is unremarkable. IMPRESSION: No acute cardiopulmonary abnormality. Electronically Signed   By: Meda Klinefelter M.D.   On: 07/04/2021 12:54    Procedures Ultrasound ED Peripheral IV (Provider)  Date/Time: 07/04/2021 2:01 PM  Performed  by: Tedd Sias, PA Authorized by: Tedd Sias, PA   Procedure details:    Indications: multiple failed IV attempts and poor IV access     Skin Prep: chlorhexidine gluconate     Location:  Right AC   Angiocath:  20 G   Bedside Ultrasound Guided: Yes     Images: not archived     Patient tolerated procedure without complications: Yes     Dressing applied: Yes   Ultrasound ED Peripheral IV (Provider)  Date/Time: 07/04/2021 2:01 PM  Performed by: Tedd Sias, PA Authorized by: Tedd Sias, PA   Procedure details:    Indications: hydration and multiple failed IV attempts     Skin Prep: chlorhexidine gluconate     Location:  Left AC   Angiocath:  20 G   Bedside Ultrasound Guided: Yes     Images: not archived     Patient tolerated procedure without complications: Yes     Dressing applied: Yes   .Critical Care  Performed by: Tedd Sias, PA Authorized by: Tedd Sias, PA   Critical care provider statement:    Critical care time (minutes):  39   Critical care was necessary to treat or prevent imminent or life-threatening deterioration of the following conditions:  Metabolic crisis   Critical care was time spent personally by me on the following activities:  Development of treatment plan with patient or surrogate, discussions with consultants, evaluation of patient's response to treatment, examination of patient, ordering and review of laboratory studies, ordering and review of radiographic studies, ordering and performing treatments and interventions, pulse oximetry, re-evaluation of patient's condition and review of old charts     Medications Ordered in ED Medications  insulin regular, human (MYXREDLIN) 100 units/ 100 mL infusion (7.5 Units/hr Intravenous Infusion Verify 07/04/21 1526)  lactated ringers infusion ( Intravenous Infusion Verify 07/04/21 1526)  dextrose 5 % in lactated ringers infusion (has no administration in time range)  dextrose 50 % solution 0-50 mL (has no administration in time range)   docusate sodium (COLACE) capsule 100 mg (has no administration in time range)  polyethylene glycol (MIRALAX / GLYCOLAX) packet 17 g (has no administration in time range)  enoxaparin (LOVENOX) injection 40 mg (has no administration in time range)  fluconazole (DIFLUCAN) IVPB 200 mg (has no administration in time range)  metroNIDAZOLE (FLAGYL) IVPB 500 mg ( Intravenous Infusion Verify 07/04/21 1526)  lactated ringers bolus 1,000 mL (has no administration in time range)  lactated ringers bolus 1,000 mL (1,000 mLs Intravenous New Bag/Given 07/04/21 1134)    ED Course/ Medical Decision Making/ A&P Clinical Course as of 07/04/21 1535  Sun Jul 04, 2021  1128 Made aware by triage RN that patient is a difficult IV access.  Placed 20-gauge IV and proximal right upper extremity.  15 cc of blood sent to lab [WF]  1129 Patient appears to be Kussmaul breathing.  She is oriented to self patient and date but very lethargic.  Will order 1 L LR and just make sure she is moved to room expeditiously. [WF]  [place]? [WF]  [place] of [facility] [WF]    Clinical Course User Index [WF] Tedd Sias, [place]                           Medical Decision Making Amount and/or Complexity of Data Reviewed Labs: ordered. Radiology: ordered.  Risk Prescription drug management. Decision regarding  hospitalization.    This patient presents to the ED for concern of AMS, this involves a number of treatment options, and is a complaint that carries with it a high risk of complications and morbidity.  The differential diagnosis includes The differential diagnosis for AMS is extensive and includes, but is not limited to: drug overdose - opioids, alcohol, sedatives, antipsychotics, drug withdrawal, others; Metabolic: hypoxia, hypoglycemia, hyperglycemia, hypercalcemia, hypernatremia, hyponatremia, uremia, hepatic encephalopathy, hypothyroidism, hyperthyroidism, vitamin B12 or thiamine deficiency, carbon monoxide  poisoning, Wilson's disease, Lactic acidosis, DKA/HHOS; Infectious: meningitis, encephalitis, bacteremia/sepsis, urinary tract infection, pneumonia, neurosyphilis; Structural: Space-occupying lesion, (brain tumor, subdural hematoma, hydrocephalus,); Vascular: stroke, subarachnoid hemorrhage, coronary ischemia, hypertensive encephalopathy, CNS vasculitis, thrombotic thrombocytopenic purpura, disseminated intravascular coagulation, hyperviscosity; Psychiatric: Schizophrenia, depression; Other: Seizure, hypothermia, heat stroke, dementia    Co morbidities: Discussed in HPI   Brief History:  Patient is a 21 year old female with past medical history significant for DM1 and DKA  Seems that she has a history of medication noncompliance.  Her friend at bedside tells me that she seemed like she was more sick and confused last night.  She stated over with her friend and this morning she was nauseous vomiting and seemed more confused and lethargic.  Friend called 911 and EMS transported patient with CBG reading high.  Patient originally not able to provide any information other than states that she has no pain     EMR reviewed including pt PMHx, past surgical history and past visits to ER.   See HPI for more details   Lab Tests:   I ordered and independently interpreted labs. Labs notable for initial blood sugar with reading of greater than 1000 on BMP.  Anion gap on my calculation around 32.  Bicarb undetectably low on BMP on VBG it is 4.3.  Urinalysis with ketones glucosuria and hazy appearance.  CBC with likely reactive leukocytosis.  There is erythrocytosis and elevation in platelets also consistent with dehydration likely from hyperglycemia as well.  VBG with pH of less than 6.95.  She is experiencing a metabolic acidosis.  But hydroxybutyrate greater than 8.  Magnesium phosphorus elevated.   Imaging Studies:  NAD. I personally reviewed all imaging studies and no acute abnormality found. I  agree with radiology interpretation.  CT head and chest x-ray unremarkable.  Cardiac Monitoring:  The patient was maintained on a cardiac monitor.  I personally viewed and interpreted the cardiac monitored which showed an underlying rhythm of: Sinus tachycardia EKG non-ischemic T waves peaked slightly on EKG   Medicines ordered:  I ordered medication including insulin drip, LR bolus for hydration hyperglycemia and acidosis Reevaluation of the patient after these medicines showed that the patient improved I have reviewed the patients home medicines and have made adjustments as needed   Critical Interventions:  ICU admission, insulin initiation   Consults/Attending Physician   I discussed this case with my attending physician who cosigned this note including patient's presenting symptoms, physical exam, and planned diagnostics and interventions. Attending physician stated agreement with plan or made changes to plan which were implemented.   Attending physician assessed patient at bedside.    Reevaluation:  After the interventions noted above I re-evaluated patient and found that they have :improved  Patient is mental status is dramatically improving.  She is now requesting water.  Is conversant.   Social Determinants of Health:      Problem List / ED Course:  DKA. Acidosis secondary to above.  pH less than 7.  Consult with  critical care.  We will hold off on bicarb at this time. Hyperkalemia of 6.1.  We will hold off on calcium gluconate at this time after consultation with PCCM. Hyperglycemia being treated with insulin   Dispostion:  After consideration of the diagnostic results and the patients response to treatment, I feel that the patent would benefit from admission      Patient admitted to ICU for DKA with pH less than 7.  ICU attending is in agreement with plan to not initiate bicarb we will continue to hydrate and is on insulin drip.   Final Clinical  Impression(s) / ED Diagnoses Final diagnoses:  Diabetic ketoacidosis without coma associated with type 1 diabetes mellitus [facility])    Rx / DC Orders ED Discharge Orders     None         Tedd Sias, [place] 07/04/21 1536    Tegeler, Gwenyth Allegra, MD 07/08/21 [phone]
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
## 2021-07-05 DIAGNOSIS — G9341 Metabolic encephalopathy: Secondary | ICD-10-CM

## 2021-07-05 DIAGNOSIS — E1010 Type 1 diabetes mellitus with ketoacidosis without coma: Secondary | ICD-10-CM

## 2021-07-05 DIAGNOSIS — B9689 Other specified bacterial agents as the cause of diseases classified elsewhere: Secondary | ICD-10-CM

## 2021-07-05 DIAGNOSIS — N760 Acute vaginitis: Secondary | ICD-10-CM

## 2021-07-05 DIAGNOSIS — E876 Hypokalemia: Secondary | ICD-10-CM

## 2021-07-05 LAB — GLUCOSE, CAPILLARY
Glucose-Capillary: 156 mg/dL — ABNORMAL HIGH (ref 70–99)
Glucose-Capillary: 173 mg/dL — ABNORMAL HIGH (ref 70–99)
Glucose-Capillary: 150 mg/dL — ABNORMAL HIGH (ref 70–99)
Glucose-Capillary: 170 mg/dL — ABNORMAL HIGH (ref 70–99)
Glucose-Capillary: 120 mg/dL — ABNORMAL HIGH (ref 70–99)
Glucose-Capillary: 213 mg/dL — ABNORMAL HIGH (ref 70–99)
Glucose-Capillary: >600 mg/dL (ref 70–99)
Glucose-Capillary: 169 mg/dL — ABNORMAL HIGH (ref 70–99)
Glucose-Capillary: 149 mg/dL — ABNORMAL HIGH (ref 70–99)
Glucose-Capillary: 153 mg/dL — ABNORMAL HIGH (ref 70–99)
Glucose-Capillary: 157 mg/dL — ABNORMAL HIGH (ref 70–99)
Glucose-Capillary: 115 mg/dL — ABNORMAL HIGH (ref 70–99)
Glucose-Capillary: 163 mg/dL — ABNORMAL HIGH (ref 70–99)
Glucose-Capillary: 143 mg/dL — ABNORMAL HIGH (ref 70–99)
Glucose-Capillary: 134 mg/dL — ABNORMAL HIGH (ref 70–99)
Glucose-Capillary: 140 mg/dL — ABNORMAL HIGH (ref 70–99)
Glucose-Capillary: 316 mg/dL — ABNORMAL HIGH (ref 70–99)

## 2021-07-05 LAB — BASIC METABOLIC PANEL
Anion gap: 11 (ref 5–15)
Anion gap: 9 (ref 5–15)
Anion gap: 10 (ref 5–15)
Anion gap: 11 (ref 5–15)
Anion gap: 10 (ref 5–15)
BUN: 20 mg/dL (ref 6–20)
BUN: 16 mg/dL (ref 6–20)
BUN: 15 mg/dL (ref 6–20)
BUN: 13 mg/dL (ref 6–20)
BUN: 8 mg/dL (ref 6–20)
CO2: 17 mmol/L — ABNORMAL LOW (ref 22–32)
CO2: 19 mmol/L — ABNORMAL LOW (ref 22–32)
CO2: 18 mmol/L — ABNORMAL LOW (ref 22–32)
CO2: 17 mmol/L — ABNORMAL LOW (ref 22–32)
CO2: 19 mmol/L — ABNORMAL LOW (ref 22–32)
Calcium: 9.2 mg/dL (ref 8.9–10.3)
Calcium: 8.9 mg/dL (ref 8.9–10.3)
Calcium: 8.9 mg/dL (ref 8.9–10.3)
Calcium: 9.1 mg/dL (ref 8.9–10.3)
Calcium: 8.9 mg/dL (ref 8.9–10.3)
Chloride: 113 mmol/L — ABNORMAL HIGH (ref 98–111)
Chloride: 113 mmol/L — ABNORMAL HIGH (ref 98–111)
Chloride: 113 mmol/L — ABNORMAL HIGH (ref 98–111)
Chloride: 114 mmol/L — ABNORMAL HIGH (ref 98–111)
Chloride: 109 mmol/L (ref 98–111)
Creatinine, Ser: 0.71 mg/dL (ref 0.44–1.00)
Creatinine, Ser: 0.51 mg/dL (ref 0.44–1.00)
Creatinine, Ser: 0.62 mg/dL (ref 0.44–1.00)
Creatinine, Ser: 0.54 mg/dL (ref 0.44–1.00)
Creatinine, Ser: 0.51 mg/dL (ref 0.44–1.00)
GFR, Estimated: >60 mL/min (ref >60)
GFR, Estimated: >60 mL/min (ref >60)
GFR, Estimated: >60 mL/min (ref >60)
GFR, Estimated: >60 mL/min (ref >60)
GFR, Estimated: >60 mL/min (ref >60)
Glucose, Bld: 164 mg/dL — ABNORMAL HIGH (ref 70–99)
Glucose, Bld: 125 mg/dL — ABNORMAL HIGH (ref 70–99)
Glucose, Bld: 148 mg/dL — ABNORMAL HIGH (ref 70–99)
Glucose, Bld: 148 mg/dL — ABNORMAL HIGH (ref 70–99)
Glucose, Bld: 196 mg/dL — ABNORMAL HIGH (ref 70–99)
Potassium: 3.5 mmol/L (ref 3.5–5.1)
Potassium: 3.4 mmol/L — ABNORMAL LOW (ref 3.5–5.1)
Potassium: 3.3 mmol/L — ABNORMAL LOW (ref 3.5–5.1)
Potassium: 3.3 mmol/L — ABNORMAL LOW (ref 3.5–5.1)
Potassium: 3.8 mmol/L (ref 3.5–5.1)
Sodium: 141 mmol/L (ref 135–145)
Sodium: 141 mmol/L (ref 135–145)
Sodium: 141 mmol/L (ref 135–145)
Sodium: 142 mmol/L (ref 135–145)
Sodium: 138 mmol/L (ref 135–145)

## 2021-07-05 LAB — MAGNESIUM
Magnesium: 2 mg/dL (ref 1.7–2.4)
Magnesium: 2 mg/dL (ref 1.7–2.4)

## 2021-07-05 LAB — PHOSPHORUS
Phosphorus: 2.5 mg/dL (ref 2.5–4.6)
Phosphorus: 1.8 mg/dL — ABNORMAL LOW (ref 2.5–4.6)

## 2021-07-05 LAB — CBC
HCT: 31.3 % — ABNORMAL LOW (ref 36.0–46.0)
Hemoglobin: 10.1 g/dL — ABNORMAL LOW (ref 12.0–15.0)
MCH: 28.2 pg (ref 26.0–34.0)
MCHC: 32.3 g/dL (ref 30.0–36.0)
MCV: 87.4 fL (ref 80.0–100.0)
Platelets: 309 10*3/uL (ref 150–400)
RBC: 3.58 MIL/uL — ABNORMAL LOW (ref 3.87–5.11)
RDW: 15 % (ref 11.5–15.5)
WBC: 11.1 10*3/uL — ABNORMAL HIGH (ref 4.0–10.5)
nRBC: 0 % (ref 0.0–0.2)

## 2021-07-05 LAB — BETA-HYDROXYBUTYRIC ACID
Beta-Hydroxybutyric Acid: 2.64 mmol/L — ABNORMAL HIGH (ref 0.05–0.27)
Beta-Hydroxybutyric Acid: 1.41 mmol/L — ABNORMAL HIGH (ref 0.05–0.27)
Beta-Hydroxybutyric Acid: 0.7 mmol/L — ABNORMAL HIGH (ref 0.05–0.27)

## 2021-07-05 MED ORDER — INSULIN GLARGINE-YFGN 100 UNIT/ML ~~LOC~~ SOLN
25.0000 [IU] | Freq: Every day | SUBCUTANEOUS | Status: DC
  Administered 2021-07-05 – 2021-07-06 (×2): 25 [IU] via SUBCUTANEOUS
  Filled 2021-07-05 (×2): qty 0.25

## 2021-07-05 MED ORDER — CHLORHEXIDINE GLUCONATE CLOTH 2 % EX PADS
6.0000 | MEDICATED_PAD | Freq: Every day | CUTANEOUS | Status: DC
  Administered 2021-07-05: 6 via TOPICAL

## 2021-07-05 MED ORDER — POTASSIUM & SODIUM PHOSPHATES 280-160-250 MG PO PACK
1.0000 | PACK | Freq: Once | ORAL | Status: AC
  Administered 2021-07-06: 1 via ORAL
  Filled 2021-07-05: qty 1

## 2021-07-05 MED ORDER — LACTATED RINGERS IV SOLN: INTRAVENOUS | Status: DC

## 2021-07-05 MED ORDER — POTASSIUM CHLORIDE 10 MEQ/100ML IV SOLN
10.0000 meq | INTRAVENOUS | Status: AC
  Administered 2021-07-05 (×4): 10 meq via INTRAVENOUS
  Filled 2021-07-05 (×4): qty 100

## 2021-07-05 MED ORDER — INSULIN ASPART 100 UNIT/ML IJ SOLN
0.0000 [IU] | INTRAMUSCULAR | Status: DC
  Administered 2021-07-05: 1 [IU] via SUBCUTANEOUS
  Administered 2021-07-05: 7 [IU] via SUBCUTANEOUS
  Administered 2021-07-06: 5 [IU] via SUBCUTANEOUS
  Administered 2021-07-06: 1 [IU] via SUBCUTANEOUS
  Administered 2021-07-06: 7 [IU] via SUBCUTANEOUS

## 2021-07-05 NOTE — Progress Notes (Signed)
NAMEKerie [name], MRN:  [id number], DOB:  Sep 22, 2000, LOS: 1 ADMISSION DATE:  07/04/2021, CONSULTATION DATE:  07/04/21 REFERRING MD:  Tegeler, CHIEF COMPLAINT:  n/v and weakness   History of Present Illness:  21yF with history of MDD, intermittent asthma, migraines, DM1 with recent admission for DKA who presented to the ED today for nausea, vomiting and weakness found to have severe DKA.   She has had prior recent admission during which she tested positive for g/c treated with CTX, doxy.  She was actually seen 6/28 at [facility] and was treated empirically for g/c again with CTX, doxy but was found to have BV, HIV and RPR negative and testing for g/c negative on swab. She never picked up medication. She does say she's had a lot on her plate over last couple days with uncle's funeral yesterday and has been doing some drinking. She wonders if both of these issues have played a role with her having another episode of DKA.  Pertinent  Medical History  DM1 Intermittent asthma Migraines  Significant Hospital Events: Including procedures, antibiotic start and stop dates in addition to other pertinent events   07/04/21 admitted for severe DKA  Interim History / Subjective:  Gap closed but beta hydroxybutyric acid is elevated at 2.64   Repeat BMP pending   Objective   Blood pressure (!) 109/53, pulse 98, temperature 97.9 F (36.6 C), temperature source Oral, resp. rate 16, height 5\' 2"  (1.575 m), weight 50 kg, last menstrual period 05/21/2021, SpO2 99 %.        Intake/Output Summary (Last 24 hours) at 07/05/2021 1119 Last data filed at 07/05/2021 0739 Gross per 24 hour  Intake 3501.24 ml  Output --  Net 3501.24 ml   Filed Weights   07/04/21 1107 07/04/21 1505 07/05/21 0500  Weight: 52.2 kg 45.7 kg 50 kg    Examination: General appearance: Thin young adult F NAD  HEENT: NCAT pink tacky mm anicteric sclera Lungs: CTAb even unlabored  CV: rrr cap refill < 3 sec  Abdomen: thin ndnt   Extremities: no acute joint deformity  Skin: c/d/w Neuro: drowsy. Following commands. No focal deficit    Resolved Hospital Problem list   Hyperkalemia  Assessment & Plan:   Acute metabolic encephalopathy -related to DKA, improving -cont supportive care   DKA DM1, with complication -trigger for current DKA -- untx BV, vaginal candidiasis, stress + drinking -acidosis has greatly improved. Gap is closed. Still on insulin gtt given beta hydroxy butyric acid  P -trend BMP, Beta hydroxybutyrate  -cont on insulin gtt -- hopefully transition off today -replace lytes as needed  -Sips and chips -- Gap is closed, could advance diet a little more but for this morning will leave as is since very drowsy -DM coordinator consult this admission  BV Vaginal candidiasis -possible trigger for current DKA presentation P -fluc and flagyl    Likely able to downgrade from ICU status later today. If so, will ask TRH to take over care 7/4 ccm off   Best Practice (right click and "Reselect all SmartList Selections" daily)   Diet/type: NPO with sips DVT prophylaxis: LMWH GI prophylaxis: N/A Lines: N/A Foley:  N/A Code Status:  full code Last date of multidisciplinary goals of care discussion [updated pt at bedside]  Labs   CBC: Recent Labs  Lab 07/04/21 1110 07/04/21 1135 07/05/21 0444  WBC 16.8*  --  11.1*  HGB 13.7 16.0* 10.1*  HCT 48.5* 47.0* 31.3*  MCV 97.6  --  87.4  PLT 465*  --  309    Basic Metabolic Panel: Recent Labs  Lab 07/04/21 1317 07/04/21 1636 07/04/21 2314 07/05/21 0249 07/05/21 0409 07/05/21 0655 07/05/21 1004  NA  --  146* 146* 141  --  141 141  K  --  5.3* 4.5 3.5  --  3.4* 3.3*  CL  --  110 116* 113*  --  113* 113*  CO2  --  <7* 13* 17*  --  19* 18*  GLUCOSE  --  410* 190* 164*  --  125* 148*  BUN  --  40* 23* 20  --  16 15  CREATININE  --  1.36* 0.93 0.71  --  0.51 0.62  CALCIUM  --  10.4* 9.2 9.2  --  8.9 8.9  MG 3.5* 3.3*  --   --  2.0  --    --   PHOS 11.3* 6.6*  --   --  2.5  --   --    GFR: Estimated Creatinine Clearance: 88.5 mL/min (by C-G formula based on SCr of 0.62 mg/dL). Recent Labs  Lab 07/04/21 1110 07/05/21 0444  WBC 16.8* 11.1*    Liver Function Tests: Recent Labs  Lab 07/04/21 1618  AST 45*  ALT 31  ALKPHOS 188*  BILITOT 2.0*  PROT 10.0*  ALBUMIN 4.7   No results for input(s): "LIPASE", "AMYLASE" in the last 168 hours. No results for input(s): "AMMONIA" in the last 168 hours.  ABG    Component Value Date/Time   PHART 7.14 (LL) 04/14/2021 1846   PCO2ART <18 (LL) 04/14/2021 1846   PO2ART 126 (H) 04/14/2021 1846   HCO3 4.3 (L) 07/04/2021 1200   TCO2 6 (L) 07/04/2021 1135   ACIDBASEDEF 28.8 (H) 07/04/2021 1200   O2SAT 69.8 07/04/2021 1200     Coagulation Profile: No results for input(s): "INR", "PROTIME" in the last 168 hours.  Cardiac Enzymes: No results for input(s): "CKTOTAL", "CKMB", "CKMBINDEX", "TROPONINI" in the last 168 hours.  HbA1C: HbA1c, POC (controlled diabetic range)  Date/Time Value Ref Range Status  08/18/2020 03:32 PM 14.7 (A) 0.0 - 7.0 % Final   HbA1c POC (<> result, manual entry)  Date/Time Value Ref Range Status  06/26/2020 10:12 AM >15.0 4.0 - 5.6 % Final  02/19/2020 10:10 AM >15.0 4.0 - 5.6 % Final   Hgb A1c MFr Bld  Date/Time Value Ref Range Status  04/10/2021 03:11 PM 14.6 (H) 4.8 - 5.6 % Final    Comment:    (NOTE) Pre diabetes:          5.7%-6.4%  Diabetes:              >6.4%  Glycemic control for   <7.0% adults with diabetes   02/07/2021 02:24 AM 14.0 (H) 4.8 - 5.6 % Final    Comment:    (NOTE) Pre diabetes:          5.7%-6.4%  Diabetes:              >6.4%  Glycemic control for   <7.0% adults with diabetes     CBG: Recent Labs  Lab 07/05/21 0704 07/05/21 0813 07/05/21 0915 07/05/21 1013 07/05/21 1113  GLUCAP 120* 213* 169* 149* 153*   CRITICAL CARE Performed by: Lanier Clam   Total critical care time: 35 minutes  Critical  care time was exclusive of separately billable procedures and treating other patients. Critical care was necessary to treat or prevent imminent or life-threatening deterioration.  Critical care  was time spent personally by me on the following activities: development of treatment plan with patient and/or surrogate as well as nursing, discussions with consultants, evaluation of patient's response to treatment, examination of patient, obtaining history from patient or surrogate, ordering and performing treatments and interventions, ordering and review of laboratory studies, ordering and review of radiographic studies, pulse oximetry and re-evaluation of patient's condition.   Tessie Fass MSN, AGACNP-BC [facility] Amion for pager 07/05/2021, 11:19 AM

## 2021-07-05 NOTE — Progress Notes (Signed)
eLink Physician-Brief Progress Note Patient Name: Dawn [name] DOB: 07-01-2000 MRN: [id number]   Date of Service  07/05/2021  HPI/Events of Note  Notified of labs with K 4.7, Na 139, Phos 1.8.  Pt admitted with DKA has been transitioned to SQ insulin already.   eICU Interventions  Give Phosnak.      Intervention Category Minor Interventions: Electrolytes abnormality - evaluation and management  Larinda Buttery 07/05/2021, 11:52 PM

## 2021-07-05 NOTE — TOC Initial Note (Signed)
Transition of Care [facility]) - Initial/Assessment Note    Patient Details  Name: Dawn [name] MRN: [id number] Date of Birth: 2000/02/18  [facility]) CM/SW Contact:    Golda Acre, RN Phone Number: 07/05/2021, 7:43 AM  Clinical Narrative:                 Patient will need substance abuse resources when stable and diabetic resources from the coordinator.  Expected Discharge Plan: Home/Self Care Barriers to Discharge: Continued Medical Work up   Patient Goals and CMS Choice Patient states their goals for this hospitalization and ongoing recovery are:: to gohome and get everything under control CMS Medicare.gov Compare Post Acute Care list provided to:: Patient Choice offered to / list presented to : Patient  Expected Discharge Plan and Services Expected Discharge Plan: Home/Self Care   Discharge Planning Services: CM Consult   Living arrangements for the past 2 months: Single Family Home                                      Prior Living Arrangements/Services Living arrangements for the past 2 months: Single Family Home Lives with:: Spouse Patient language and need for interpreter reviewed:: Yes Do you feel safe going back to the place where you live?: Yes            Criminal Activity/Legal Involvement Pertinent to Current Situation/Hospitalization: No - Comment as needed  Activities of Daily Living Home Assistive Devices/Equipment: None ADL Screening (condition at time of admission) Patient's cognitive ability adequate to safely complete daily activities?: Yes Is the patient deaf or have difficulty hearing?: No Does the patient have difficulty seeing, even when wearing glasses/contacts?: No Does the patient have difficulty concentrating, remembering, or making decisions?: No Patient able to express need for assistance with ADLs?: Yes Does the patient have difficulty dressing or bathing?: No Independently performs ADLs?: Yes (appropriate for  developmental age) Does the patient have difficulty walking or climbing stairs?: No Weakness of Legs: None Weakness of Arms/Hands: None  Permission Sought/Granted                  Emotional Assessment Appearance:: Appears stated age     Orientation: : Oriented to Self, Oriented to Place, Oriented to  Time, Oriented to Situation Alcohol / Substance Use: Illicit Drugs, Alcohol Use Psych Involvement: No (comment)  Admission diagnosis:  DKA (diabetic ketoacidosis) (HCC) [E11.10] Patient Active Problem List   Diagnosis Date Noted   Menstrual periods irregular 06/17/2021   Chest wall pain    DKA (diabetic ketoacidosis) (HCC) 04/10/2021   Acute pyelonephritis 04/07/2021   Other specified disorders of kidney and ureter 03/17/2021   Encounter for counseling before starting and about pre-exposure prophylaxis for HIV 01/25/2021   DKA, type 1 (HCC) 01/03/2021   AKI (acute kidney injury) (HCC)    Nausea and vomiting 11/26/2020   Major depressive disorder    Viral URI 08/27/2020   Leg swelling 08/19/2020   Unprotected sexual intercourse 06/05/2020   Lower abdominal pain 12/24/2019   Tension headache, chronic 12/24/2019   Hx of migraines 07/11/2019   Protein-calorie malnutrition, severe 06/25/2019   Asthma 09/25/2018   Noncompliance with diabetes treatment 07/04/2017   MDD (major depressive disorder), recurrent episode, moderate (HCC) 03/16/2016   Maladaptive health behaviors affecting medical condition 03/24/2015   Poor social situation 07/20/2013   Uncontrolled type 1 diabetes mellitus with hyperglycemia (HCC) 07/07/2013  PCP:  Darral Dash, DO Pharmacy:   [facility] [phone] [address], [place] - [address] AT [facility] OF [address] [place] [address] Phone: [phone] Fax: [phone]  [facility] [address] [place] [address] Phone: [phone] Fax:  [phone]     Social Determinants of Health (SDOH) Interventions    Readmission Risk Interventions    12/19/2019    1:16 PM  Readmission Risk Prevention Plan  Transportation Screening Complete  PCP or Specialist Appt within 3-5 Days Complete  HRI or Home Care Consult Complete  Social Work Consult for Recovery Care Planning/Counseling Complete  Palliative Care Screening Not Applicable  Medication Review Oceanographer) Complete

## 2021-07-05 NOTE — Progress Notes (Signed)
Inpatient Diabetes Program Recommendations  AACE/ADA: New Consensus Statement on Inpatient Glycemic Control (2015)  Target Ranges:  Prepandial:   less than 140 mg/dL      Peak postprandial:   less than 180 mg/dL (1-2 hours)      Critically ill patients:  140 - 180 mg/dL   Lab Results  Component Value Date   GLUCAP 157 (H) 07/05/2021   HGBA1C 14.6 (H) 04/10/2021    Review of Glycemic Control  Diabetes history: DM1 Outpatient Diabetes medications: Lantus 40 QHS, Novolog 5-10 units TID Current orders for Inpatient glycemic control: IV insulin per Endo[facility] for DKA  HgbA1C - 14.6% Endo - Dr Shawnee Knapp  Inpatient Diabetes Program Recommendations:    When ready for transition to SQ insulin, give Semglee 2 H prior to discontinuation of drip. Semglee 25 units Q24H, titrate until FBS < 180 mg/dL Novolog 0-9 units J1O x 12H, then TID with meals and 0-5 HS When diet advanced, Novolog 4 units TID if eating > 50% meal  Spoke with pt at bedside about her diabetes and blood sugar control. Pt states she did not miss her Lantus, although did not take any Novolog on Friday d/t being at funeral. Long discussion regarding importance of monitoring her blood sugars and taking insulin as prescribed. Pt states she gets her insulin from [facility] and does not have a copay. Said she normally uses a Dexcom and her Endo was ordering another one for her. Has phone that's disconnected for 2 months. Said she is supposed to start job at [employer] on July 4th. Admits to being homeless and stays with different people, said she can't stay with Mom or sister.   Diabetes Coordinators have seen pt on many occasions over the past few months.  Stressed importance of f/u with Dr Shawnee Knapp when discharged from hospital. Needs prescription for blood glucose meter when d/ced.  Thank you. Ailene Ards, RD, LDN, CDE Inpatient Diabetes Coordinator [phone]

## 2021-07-06 LAB — CBC
HCT: 35.9 % — ABNORMAL LOW (ref 36.0–46.0)
Hemoglobin: 11.2 g/dL — ABNORMAL LOW (ref 12.0–15.0)
MCH: 27.9 pg (ref 26.0–34.0)
MCHC: 31.2 g/dL (ref 30.0–36.0)
MCV: 89.3 fL (ref 80.0–100.0)
Platelets: 308 10*3/uL (ref 150–400)
RBC: 4.02 MIL/uL (ref 3.87–5.11)
RDW: 15.8 % — ABNORMAL HIGH (ref 11.5–15.5)
WBC: 9.3 10*3/uL (ref 4.0–10.5)
nRBC: 0 % (ref 0.0–0.2)

## 2021-07-06 LAB — BASIC METABOLIC PANEL
Anion gap: 8 (ref 5–15)
Anion gap: 14 (ref 5–15)
Anion gap: 10 (ref 5–15)
BUN: 9 mg/dL (ref 6–20)
BUN: 9 mg/dL (ref 6–20)
BUN: 7 mg/dL (ref 6–20)
CO2: 20 mmol/L — ABNORMAL LOW (ref 22–32)
CO2: 20 mmol/L — ABNORMAL LOW (ref 22–32)
CO2: 22 mmol/L (ref 22–32)
Calcium: 8.6 mg/dL — ABNORMAL LOW (ref 8.9–10.3)
Calcium: 9.3 mg/dL (ref 8.9–10.3)
Calcium: 9 mg/dL (ref 8.9–10.3)
Chloride: 111 mmol/L (ref 98–111)
Chloride: 104 mmol/L (ref 98–111)
Chloride: 108 mmol/L (ref 98–111)
Creatinine, Ser: 0.57 mg/dL (ref 0.44–1.00)
Creatinine, Ser: 0.56 mg/dL (ref 0.44–1.00)
Creatinine, Ser: 0.5 mg/dL (ref 0.44–1.00)
GFR, Estimated: >60 mL/min (ref >60)
GFR, Estimated: >60 mL/min (ref >60)
GFR, Estimated: >60 mL/min (ref >60)
Glucose, Bld: 280 mg/dL — ABNORMAL HIGH (ref 70–99)
Glucose, Bld: 213 mg/dL — ABNORMAL HIGH (ref 70–99)
Glucose, Bld: 121 mg/dL — ABNORMAL HIGH (ref 70–99)
Potassium: 4.7 mmol/L (ref 3.5–5.1)
Potassium: 3.6 mmol/L (ref 3.5–5.1)
Potassium: 3.1 mmol/L — ABNORMAL LOW (ref 3.5–5.1)
Sodium: 139 mmol/L (ref 135–145)
Sodium: 138 mmol/L (ref 135–145)
Sodium: 140 mmol/L (ref 135–145)

## 2021-07-06 LAB — GLUCOSE, CAPILLARY
Glucose-Capillary: 257 mg/dL — ABNORMAL HIGH (ref 70–99)
Glucose-Capillary: 129 mg/dL — ABNORMAL HIGH (ref 70–99)
Glucose-Capillary: 262 mg/dL — ABNORMAL HIGH (ref 70–99)

## 2021-07-06 LAB — BETA-HYDROXYBUTYRIC ACID: Beta-Hydroxybutyric Acid: 0.39 mmol/L — ABNORMAL HIGH (ref 0.05–0.27)

## 2021-07-06 MED ORDER — POTASSIUM CHLORIDE CRYS ER 20 MEQ PO TBCR
20.0000 meq | EXTENDED_RELEASE_TABLET | Freq: Every day | ORAL | Status: DC
  Administered 2021-07-06: 20 meq via ORAL
  Filled 2021-07-06: qty 1

## 2021-07-06 MED ORDER — POTASSIUM CHLORIDE CRYS ER 20 MEQ PO TBCR
60.0000 meq | EXTENDED_RELEASE_TABLET | Freq: Once | ORAL | Status: AC
  Administered 2021-07-06: 60 meq via ORAL
  Filled 2021-07-06: qty 3

## 2021-07-06 MED ORDER — ACCU-CHEK GUIDE ME W/DEVICE KIT: 1.0000 | PACK | Freq: Three times a day (TID) | 0 refills | Status: DC

## 2021-07-06 MED ORDER — ACETAMINOPHEN 325 MG PO TABS
650.0000 mg | ORAL_TABLET | Freq: Four times a day (QID) | ORAL | Status: DC | PRN
  Administered 2021-07-06: 650 mg via ORAL
  Filled 2021-07-06: qty 2

## 2021-07-06 NOTE — TOC Transition Note (Signed)
Transition of Care [facility]) - CM/SW Discharge Note   Patient Details  Name: Dawn [name] MRN: [id number] Date of Birth: [birth year]  Transition of Care [facility]) CM/SW Contact:  Golda Acre, RN Phone Number: 07/06/2021, 1:50 PM   Clinical Narrative:    Dcd to home with no toc needs. Refused substance abuse resources.     Barriers to Discharge: Continued Medical Work up   Patient Goals and CMS Choice Patient states their goals for this hospitalization and ongoing recovery are:: to gohome and get everything under control CMS Medicare.gov Compare Post Acute Care list provided to:: Patient Choice offered to / list presented to : Patient  Discharge Placement                       Discharge Plan and Services   Discharge Planning Services: CM Consult                                 Social Determinants of Health (SDOH) Interventions     Readmission Risk Interventions    12/19/2019    1:16 PM  Readmission Risk Prevention Plan  Transportation Screening Complete  PCP or Specialist Appt within 3-5 Days Complete  HRI or Home Care Consult Complete  Social Work Consult for Recovery Care Planning/Counseling Complete  Palliative Care Screening Not Applicable  Medication Review Oceanographer) Complete

## 2021-07-06 NOTE — Progress Notes (Signed)
Discharge education & packet given to pt. PT verbalizes understanding & has no further questions. Ivs removed from pt.

## 2021-07-06 NOTE — Hospital Course (Signed)
21yF with history of MDD, intermittent asthma, migraines, DM1 with recent admission for DKA who presented to the ED  for nausea, vomiting and weakness found to have severe DKA. Per HPI-she has had prior recent admission during which she tested positive for g/c treated with CTX, doxy.  She was actually seen 6/28 at [facility] UC and was treated empirically for g/c again with CTX, doxy but was found to have BV, HIV and RPR negative and testing for g/c negative on swab. She never picked up medication. She does say she's had a lot on her plate over last couple days with uncle's funeral yesterday and has been doing some drinking. She wonders if both of these issues have played a role with her having another episode of DKA. Patient admitted to ICU treated with IV insulin drip, transition to subcu insulin 07/05/21.  Blood sugar stabilized, tolerating diet she will continue on long-acting insulin at bedtime and Premeal/sliding scale.  Electrolytes were replaced.

## 2021-07-06 NOTE — Discharge Summary (Signed)
Physician Discharge Summary  Patient ID: Orchid Glassberg MRN: [id number] DOB/AGE: 2000/10/15 21 y.o.  Admit date: 07/04/2021 Discharge date: 07/06/2021  Admission Diagnoses:  Discharge Diagnoses:  Active Problems:   DKA (diabetic ketoacidosis) ([place])   Discharged Condition: good  Hospital Course:  48F admitted for severe DKA in setting etoh use, BV, vaginal candidiasis. Fluid resuscitated, given insulin gtt, BV treated with flagyl, candidiasis with fluconazole. She was transitioned to her home subcutaneous regimen and discharged home.  Consults: None and pulmonary/intensive care  Significant Diagnostic Studies: labs: pH <6.95 on arrival, AG uncalculably high on arrival  Treatments:  Insulin gtt, IVF  Discharge Exam: Blood pressure 113/80, pulse 84, temperature (!) 97.2 F (36.2 C), resp. rate 16, height 5' 2"  (1.575 m), weight 52.1 kg, last menstrual period 05/21/2021, SpO2 99 %.  General appearance: 21 y.o., female, NAD, thin Eyes: anicteric sclerae; PERRL, tracking appropriately HENT: NCAT; MMM Neck: Trachea midline; no lymphadenopathy, no JVD Lungs: CTAB, no crackles, no wheeze, with normal respiratory effort CV: RRR, no murmur  Abdomen: Soft, non-tender; non-distended, BS present  Extremities: No peripheral edema, warm Skin: Normal turgor and texture; no rash Psych: Appropriate affect Neuro: Alert and oriented to person and place, no focal deficit    Disposition: Discharge disposition: 01-Home or Self Care        Allergies as of 07/06/2021       Reactions   Latex Other (See Comments)   Causes skin irritation   Tape Other (See Comments)   Causes skin irritation   Shellfish Allergy Rash, Other (See Comments)   Reaction to scallops        Medication List     STOP taking these medications    doxycycline 100 MG capsule Commonly known as: VIBRAMYCIN   metroNIDAZOLE 500 MG tablet Commonly known as: FLAGYL       TAKE these medications    Accu-Chek  Guide Me w/Device Kit 1 each by Does not apply route 3 (three) times daily before meals.   Accu-Chek Guide test strip Generic drug: glucose blood Use to check glucose 6x daily   Accu-Chek Softclix Lancets lancets Please use to check blood sugar up to 6 times/day.   acetaminophen 500 MG tablet Commonly known as: TYLENOL Take 1,000 mg by mouth every 6 (six) hours as needed for moderate pain or headache.   albuterol 108 (90 Base) MCG/ACT inhaler Commonly known as: VENTOLIN HFA Inhale 2 puffs into the lungs every 6 (six) hours as needed for wheezing or shortness of breath.   Baqsimi One Pack 3 MG/DOSE Powd Generic drug: Glucagon Place 3 mg into the nose once as needed (low blood sugar).   BD Pen Needle Nano 2nd Gen 32G X 4 MM Misc Generic drug: Insulin Pen Needle USE TO INJECT INSULIN 6 TIMES DAILY   Dexcom G6 Transmitter Misc   ibuprofen 200 MG tablet Commonly known as: ADVIL Take 400 mg by mouth every 6 (six) hours as needed for headache or moderate pain.   insulin lispro 100 UNIT/ML KwikPen Commonly known as: HumaLOG KwikPen Inject 5 Units into the skin with breakfast, with lunch, and with evening meal. What changed:  how much to take when to take this additional instructions   Lantus SoloStar 100 UNIT/ML Solostar Pen Generic drug: insulin glargine Inject 30 Units into the skin at bedtime.   sertraline 50 MG tablet Commonly known as: ZOLOFT Take 1.5 tablets (75 mg total) by mouth daily.   SUMAtriptan 25 MG tablet Commonly known as: Imitrex Take  0.5 tablets (12.5 mg total) by mouth every 2 (two) hours as needed for migraine. May repeat in 2 hours if headache persists or recurs. What changed: how much to take         Signed: Maryjane Hurter 07/06/2021, 7:02 PM

## 2021-07-08 ENCOUNTER — Other Ambulatory Visit: Payer: Self-pay

## 2021-07-08 MED ORDER — LANTUS SOLOSTAR 100 UNIT/ML ~~LOC~~ SOPN: 30.0000 [IU] | PEN_INJECTOR | Freq: Every day | SUBCUTANEOUS | 11 refills | Status: DC

## 2021-07-08 MED ORDER — INSULIN LISPRO (1 UNIT DIAL) 100 UNIT/ML (KWIKPEN): 5.0000 [IU] | PEN_INJECTOR | SUBCUTANEOUS | 0 refills | Status: DC

## 2021-08-04 ENCOUNTER — Encounter (HOSPITAL_COMMUNITY): Payer: Self-pay | Admitting: Emergency Medicine

## 2021-08-04 ENCOUNTER — Encounter (INDEPENDENT_AMBULATORY_CARE_PROVIDER_SITE_OTHER): Payer: Self-pay

## 2021-08-04 ENCOUNTER — Observation Stay (HOSPITAL_COMMUNITY)
Admission: EM | Admit: 2021-08-04 | Discharge: 2021-08-05 | Disposition: A | Discharge status: Home / Self Care | Payer: Medicaid Other | Attending: Family Medicine | Admitting: Family Medicine

## 2021-08-04 DIAGNOSIS — Z91040 Latex allergy status: Secondary | ICD-10-CM | POA: Insufficient documentation

## 2021-08-04 DIAGNOSIS — N179 Acute kidney failure, unspecified: Secondary | ICD-10-CM | POA: Insufficient documentation

## 2021-08-04 DIAGNOSIS — F331 Major depressive disorder, recurrent, moderate: Secondary | ICD-10-CM | POA: Diagnosis present

## 2021-08-04 DIAGNOSIS — Z794 Long term (current) use of insulin: Secondary | ICD-10-CM | POA: Insufficient documentation

## 2021-08-04 DIAGNOSIS — F17210 Nicotine dependence, cigarettes, uncomplicated: Secondary | ICD-10-CM | POA: Diagnosis not present

## 2021-08-04 DIAGNOSIS — E876 Hypokalemia: Secondary | ICD-10-CM | POA: Diagnosis not present

## 2021-08-04 DIAGNOSIS — R112 Nausea with vomiting, unspecified: Secondary | ICD-10-CM | POA: Diagnosis present

## 2021-08-04 DIAGNOSIS — Z79899 Other long term (current) drug therapy: Secondary | ICD-10-CM | POA: Insufficient documentation

## 2021-08-04 DIAGNOSIS — E1065 Type 1 diabetes mellitus with hyperglycemia: Secondary | ICD-10-CM

## 2021-08-04 DIAGNOSIS — E1010 Type 1 diabetes mellitus with ketoacidosis without coma: Secondary | ICD-10-CM | POA: Diagnosis not present

## 2021-08-04 LAB — I-STAT VENOUS BLOOD GAS, ED
Acid-base deficit: 13 mmol/L — ABNORMAL HIGH (ref 0.0–2.0)
Bicarbonate: 13.2 mmol/L — ABNORMAL LOW (ref 20.0–28.0)
Calcium, Ion: 1.18 mmol/L (ref 1.15–1.40)
HCT: 48 % — ABNORMAL HIGH (ref 36.0–46.0)
Hemoglobin: 16.3 g/dL — ABNORMAL HIGH (ref 12.0–15.0)
O2 Saturation: 50 %
Potassium: 4.5 mmol/L (ref 3.5–5.1)
Sodium: 130 mmol/L — ABNORMAL LOW (ref 135–145)
TCO2: 14 mmol/L — ABNORMAL LOW (ref 22–32)
pCO2, Ven: 31.2 mmHg — ABNORMAL LOW (ref 44–60)
pH, Ven: 7.233 — ABNORMAL LOW (ref 7.25–7.43)
pO2, Ven: 31 mmHg — CL (ref 32–45)

## 2021-08-04 LAB — CBC WITH DIFFERENTIAL/PLATELET
Abs Immature Granulocytes: 0.01 10*3/uL (ref 0.00–0.07)
Basophils Absolute: 0.1 10*3/uL (ref 0.0–0.1)
Basophils Relative: 1 %
Eosinophils Absolute: 0 10*3/uL (ref 0.0–0.5)
Eosinophils Relative: 1 %
HCT: 43.8 % (ref 36.0–46.0)
Hemoglobin: 13.9 g/dL (ref 12.0–15.0)
Immature Granulocytes: 0 %
Lymphocytes Relative: 43 %
Lymphs Abs: 3.3 10*3/uL (ref 0.7–4.0)
MCH: 27.9 pg (ref 26.0–34.0)
MCHC: 31.7 g/dL (ref 30.0–36.0)
MCV: 88 fL (ref 80.0–100.0)
Monocytes Absolute: 0.7 10*3/uL (ref 0.1–1.0)
Monocytes Relative: 9 %
Neutro Abs: 3.6 10*3/uL (ref 1.7–7.7)
Neutrophils Relative %: 46 %
Platelets: 333 10*3/uL (ref 150–400)
RBC: 4.98 MIL/uL (ref 3.87–5.11)
RDW: 15.2 % (ref 11.5–15.5)
WBC: 7.7 10*3/uL (ref 4.0–10.5)
nRBC: 0 % (ref 0.0–0.2)

## 2021-08-04 LAB — I-STAT CHEM 8, ED
BUN: 23 mg/dL — ABNORMAL HIGH (ref 6–20)
Calcium, Ion: 1.18 mmol/L (ref 1.15–1.40)
Chloride: 102 mmol/L (ref 98–111)
Creatinine, Ser: 0.6 mg/dL (ref 0.44–1.00)
Glucose, Bld: 356 mg/dL — ABNORMAL HIGH (ref 70–99)
HCT: 48 % — ABNORMAL HIGH (ref 36.0–46.0)
Hemoglobin: 16.3 g/dL — ABNORMAL HIGH (ref 12.0–15.0)
Potassium: 4.5 mmol/L (ref 3.5–5.1)
Sodium: 132 mmol/L — ABNORMAL LOW (ref 135–145)
TCO2: 14 mmol/L — ABNORMAL LOW (ref 22–32)

## 2021-08-04 LAB — BASIC METABOLIC PANEL
Anion gap: 12 (ref 5–15)
BUN: 10 mg/dL (ref 6–20)
CO2: 19 mmol/L — ABNORMAL LOW (ref 22–32)
Calcium: 8.9 mg/dL (ref 8.9–10.3)
Chloride: 105 mmol/L (ref 98–111)
Creatinine, Ser: 0.77 mg/dL (ref 0.44–1.00)
GFR, Estimated: >60 mL/min (ref >60)
Glucose, Bld: 120 mg/dL — ABNORMAL HIGH (ref 70–99)
Potassium: 4 mmol/L (ref 3.5–5.1)
Sodium: 136 mmol/L (ref 135–145)

## 2021-08-04 LAB — COMPREHENSIVE METABOLIC PANEL
ALT: 26 U/L (ref 0–44)
AST: 19 U/L (ref 15–41)
Albumin: 4.1 g/dL (ref 3.5–5.0)
Alkaline Phosphatase: 152 U/L — ABNORMAL HIGH (ref 38–126)
Anion gap: 19 — ABNORMAL HIGH (ref 5–15)
BUN: 18 mg/dL (ref 6–20)
CO2: 14 mmol/L — ABNORMAL LOW (ref 22–32)
Calcium: 9.7 mg/dL (ref 8.9–10.3)
Chloride: 97 mmol/L — ABNORMAL LOW (ref 98–111)
Creatinine, Ser: 1.4 mg/dL — ABNORMAL HIGH (ref 0.44–1.00)
GFR, Estimated: 55 mL/min — ABNORMAL LOW (ref >60)
Glucose, Bld: 348 mg/dL — ABNORMAL HIGH (ref 70–99)
Potassium: 4.3 mmol/L (ref 3.5–5.1)
Sodium: 130 mmol/L — ABNORMAL LOW (ref 135–145)
Total Bilirubin: 1.6 mg/dL — ABNORMAL HIGH (ref 0.3–1.2)
Total Protein: 9.3 g/dL — ABNORMAL HIGH (ref 6.5–8.1)

## 2021-08-04 LAB — CBG MONITORING, ED
Glucose-Capillary: 365 mg/dL — ABNORMAL HIGH (ref 70–99)
Glucose-Capillary: 263 mg/dL — ABNORMAL HIGH (ref 70–99)
Glucose-Capillary: 196 mg/dL — ABNORMAL HIGH (ref 70–99)
Glucose-Capillary: 140 mg/dL — ABNORMAL HIGH (ref 70–99)
Glucose-Capillary: 119 mg/dL — ABNORMAL HIGH (ref 70–99)

## 2021-08-04 LAB — BETA-HYDROXYBUTYRIC ACID
Beta-Hydroxybutyric Acid: 6 mmol/L — ABNORMAL HIGH (ref 0.05–0.27)
Beta-Hydroxybutyric Acid: 2.67 mmol/L — ABNORMAL HIGH (ref 0.05–0.27)

## 2021-08-04 LAB — LIPASE, BLOOD: Lipase: 30 U/L (ref 11–51)

## 2021-08-04 LAB — GLUCOSE, CAPILLARY: Glucose-Capillary: 136 mg/dL — ABNORMAL HIGH (ref 70–99)

## 2021-08-04 LAB — I-STAT BETA HCG BLOOD, ED (MC, WL, AP ONLY): I-stat hCG, quantitative: <5 m[IU]/mL (ref <5)

## 2021-08-04 MED ORDER — ENOXAPARIN SODIUM 40 MG/0.4ML IJ SOSY
40.0000 mg | PREFILLED_SYRINGE | INTRAMUSCULAR | Status: DC
  Filled 2021-08-04: qty 0.4

## 2021-08-04 MED ORDER — LACTATED RINGERS IV SOLN: INTRAVENOUS | Status: DC

## 2021-08-04 MED ORDER — LACTATED RINGERS IV BOLUS
20.0000 mL/kg | Freq: Once | INTRAVENOUS | Status: AC
  Administered 2021-08-04: 1000 mL via INTRAVENOUS

## 2021-08-04 MED ORDER — ONDANSETRON HCL 4 MG/2ML IJ SOLN
4.0000 mg | Freq: Once | INTRAMUSCULAR | Status: AC
  Administered 2021-08-04: 4 mg via INTRAVENOUS
  Filled 2021-08-04: qty 2

## 2021-08-04 MED ORDER — DEXTROSE 50 % IV SOLN: 0.0000 mL | INTRAVENOUS | Status: DC | PRN

## 2021-08-04 MED ORDER — POTASSIUM CHLORIDE 10 MEQ/100ML IV SOLN
10.0000 meq | INTRAVENOUS | Status: AC
  Administered 2021-08-04 (×2): 10 meq via INTRAVENOUS
  Filled 2021-08-04 (×2): qty 100

## 2021-08-04 MED ORDER — ALBUTEROL SULFATE (2.5 MG/3ML) 0.083% IN NEBU
2.5000 mg | INHALATION_SOLUTION | Freq: Four times a day (QID) | RESPIRATORY_TRACT | Status: DC | PRN
Start: 2021-08-04 — End: 2021-08-05

## 2021-08-04 MED ORDER — SERTRALINE HCL 50 MG PO TABS
75.0000 mg | ORAL_TABLET | Freq: Every day | ORAL | Status: DC
  Administered 2021-08-05: 75 mg via ORAL
  Filled 2021-08-04: qty 1

## 2021-08-04 MED ORDER — DEXTROSE IN LACTATED RINGERS 5 % IV SOLN: INTRAVENOUS | Status: DC

## 2021-08-04 MED ORDER — ALBUTEROL SULFATE HFA 108 (90 BASE) MCG/ACT IN AERS: 2.0000 | INHALATION_SPRAY | Freq: Four times a day (QID) | RESPIRATORY_TRACT | Status: DC | PRN

## 2021-08-04 MED ORDER — LACTATED RINGERS IV BOLUS
1000.0000 mL | Freq: Once | INTRAVENOUS | Status: AC
  Administered 2021-08-04: 1000 mL via INTRAVENOUS

## 2021-08-04 MED ORDER — INSULIN REGULAR(HUMAN) IN NACL 100-0.9 UT/100ML-% IV SOLN
INTRAVENOUS | Status: DC
  Administered 2021-08-04: 2.4 [IU]/h via INTRAVENOUS
  Filled 2021-08-04: qty 100

## 2021-08-04 NOTE — Progress Notes (Signed)
Assessed bilateral arms with [place]. Poor vasculature. At this time. Nurse wants to transport pt to unit bed. Instructed to re-consult IV team as necessary. VU. Tomasita Morrow, RN

## 2021-08-04 NOTE — ED Provider Triage Note (Signed)
Emergency Medicine Provider Triage Evaluation Note  Dawn [name] , a 21 y.o. female  was evaluated in triage.  Pt complains of nausea, vomiting, hyperglycemia the past 2 days.  Patient reports that she has been out of her medications because of Medicaid and does not know that is it.  Patient also reports that this she thinks she may be pregnant.  She reports her blood sugars have been running high.  Review of Systems  Positive:  Negative:   Physical Exam  BP (!) 129/93 (BP Location: Right Arm)   Pulse (!) 127   Temp 98 F (36.7 C) (Oral)   Resp 18   LMP 05/21/2021 (Approximate) Comment: states she was spotting  SpO2 100%  Gen:   Awake, no distress   Resp:  Normal effort  MSK:   Moves extremities without difficulty  Other:    Medical Decision Making  Medically screening exam initiated at 3:26 PM.  Appropriate orders placed.  Paysen Goza was informed that the remainder of the evaluation will be completed by another provider, this initial triage assessment does not replace that evaluation, and the importance of remaining in the ED until their evaluation is complete.  Patient was previously here in early July for diabetic ketoacidosis.  She is tachycardic.  Likely dehydrated.  Probable DKA, will order labs. Nursing aware that patient needs a room now.    Achille Rich, PA-C 08/04/21 1528

## 2021-08-04 NOTE — Assessment & Plan Note (Addendum)
Acute, stable. Patient is without altered mental status. Anion gap elevated to 19 and bicarb of 14 on admission. This is likely the result of patient's insurance problems and inability to track and correct her sugar. -Admit to [facility] Teaching Service, progressive, attending Dr. McDiarmid -EndoTool initiated -Insulin gtt  -LR infusion 126ml/hr with dextrose when CBG <250 and with out dextrose for CBG >250 -Dextrose 50% prn IV for low CBG -BMP q4h -S/p x2 dose of IV potassium . -CBG monitoring -Beta-hydroxbutyric acid q8h -A1c -UA pending -NPO -Cardiac monitoring -Strict I/O -Vitals per floor

## 2021-08-04 NOTE — ED Notes (Signed)
Per admitting providers at bedside, still run insulin drip with patient's CBG of 196.

## 2021-08-04 NOTE — Assessment & Plan Note (Signed)
-  Continue Zoloft 75mg  daily

## 2021-08-04 NOTE — H&P (Cosign Needed Addendum)
Hospital Admission History and Physical Service Pager: [phone]  Patient name: Dawn [name] Medical record number: [id number] Date of Birth: Mar 02, 2000 Age: 21 y.o. Gender: female  Primary Care Provider: Darral Dash, DO Consultants: none Code Status: FULL Preferred Emergency Contact:  Contact Information     Name Relation Home Work Mobile   [place] [phone]  [phone]   Nelson,Jamal Significant other   [phone]        Chief Complaint: nausea and vomiting  Assessment and Plan: Dawn [name] is a 21 y.o. female presenting with nausea and vomiting . Differential for this patient's presentation of this includes DKA, pancreatitis, and viral/bacterial gastroenteritis.  Likely DKA given elevated anion gap metabolic acidosis, clinical symptoms, elevated CBGs, and history of prior DKA. Most likely cause of DKA is indirect noncompliance with proper insulin regimen with patient's inability to check sugars at home. Pancreatitis is unlikely with normal lipase despite some tenderness to palpation of abdomen. Cannot rule out viral/bacterial gastroenteritis, however, would not expected elevated anion gap.   * Diabetic ketoacidosis without coma associated with type 1 diabetes mellitus (HCC) Acute, stable. Patient is without altered mental status. Anion gap elevated to 19 and bicarb of 14 on admission. This is likely the result of patient's insurance problems and inability to track and correct her sugar. -Admit to [facility] Medicine Teaching Service, progressive, attending Dr. McDiarmid -EndoTool initiated -Insulin gtt  -LR infusion 164ml/hr with dextrose when CBG <250 and with out dextrose for CBG >250 -Dextrose 50% prn IV for low CBG -BMP q4h -S/p x2 dose of IV potassium . -CBG monitoring -Beta-hydroxbutyric acid q8h -A1c -UA pending -NPO -Cardiac monitoring -Strict I/O -Vitals per floor  AKI (acute kidney injury) (HCC) Acute, likely prerenal due to  dehydration caused by nausea and vomiting from DKA. Creatinine 1.4 from baseline of 0.5. -Trend with BMPs -IV fluids -Consider sodium labs if creatinine does improve -UA pending  MDD (major depressive disorder), recurrent episode, moderate (HCC) -Continue Zoloft 75mg  daily   FEN/GI: NPO VTE Prophylaxis: Lovenox  Disposition: Progressive  History of Present Illness:  Dawn [name] is a 21 y.o. female presenting with nausea and vomiting.  Patient reports she started having nausea and vomiting for the past week. She has had some abdominal pain but none currently. She felt more ill since this morning so decided to come to the ED. She reports she has not been able to check her blood sugars at home since she was discharged from the hospital 1 month ago for prior episode of DKA. She has had an insurance issue with her medicaid and was unable to afford test trips, glucometer and lancets, or her prior Dexcom. Because of this she also had to switch to basaglar from lantus over the past month. Over the past month she has been taking 30u of basaglar and dosing 5-10 units of humalog for meals depending on whether or not she ate a lot or a little. Denies confusion. Has been urinating more often over the past week and has had increased thirst.   In the ED, patient was tachycardic with otherwise stable vitals, betahydroxybutyarate was 6.00, CBG was 365, bicarb was 14, and AG was 19, and K+ was 4.3, Cr 1.4. Patient was started on 125 ml/hr dextrose LR, insulin regular gtt, LR bolus x2, 26 IV K+ x2.  Review Of Systems: Per HPI with the following additions:  Review of Systems  Constitutional:  Negative for chills and fever.  Respiratory:  Positive for cough (for 1 month).  Gastrointestinal:  Positive for abdominal pain, nausea and vomiting.  Genitourinary:  Negative for dysuria.  Neurological:  Positive for headaches.     Pertinent Past Medical History: Asthma- has not felt wheezy MDD - taking  zoloft Remainder reviewed in history tab.   Pertinent Past Surgical History: Abscess drainage   Remainder reviewed in history tab.  Pertinent Social History: Tobacco use: smokes 1 black and mild per day Alcohol use: none Other Substance use: none Lives with mom  Pertinent Family History: Reports she has both type 1 and type 2 diabetics on both sides of her family.  Remainder reviewed in history tab.   Important Outpatient Medications: Insulin - Reports she is taking Basaglar 30u, Humulaog 5-10 with meals Zoloft 75mg  Albuterol inhaler PRN  Remainder reviewed in medication history.   Objective: BP 100/77   Pulse 93   Temp 98.2 F (36.8 C) (Oral)   Resp 16   Wt 50 kg   LMP 05/21/2021 (Approximate) Comment: states she was spotting  SpO2 98%   BMI 20.16 kg/m  Exam: General: Tired appearing female lying in hospital bed, no acute distress ENTM: Moist mucous membranes, lips chapped Cardiovascular: Regular rate and rhythm, no murmurs appreciated Respiratory: Normal work of breathing on room air, clear to auscultation bilaterally Gastrointestinal: Soft, nondistended, mild tenderness to palpation in suprapubic region Extremities: Warm, well-perfused, moves all 4 extremities appropriately Neuro: A&O Psych: Appropriate mood and affect  Labs:  CBC BMET  Recent Labs  Lab 08/04/21 1700 08/04/21 1716  WBC 7.7  --   HGB 13.9 16.3*  16.3*  HCT 43.8 48.0*  48.0*  PLT 333  --    Recent Labs  Lab 08/04/21 1700 08/04/21 1716  NA 130* 132*  130*  K 4.3 4.5  4.5  CL 97* 102  CO2 14*  --   BUN 18 23*  CREATININE 1.40* 0.60  GLUCOSE 348* 356*  CALCIUM 9.7  --     Pertinent additional labs: Lipase - 30. Beta hydroxybutryic acid - 6.00  EKG: My own interpretation (not copied from electronic read) sinus tachycardia   Imaging Studies Performed:  No new imaging.   10/04/21, MD 08/04/2021, 9:38 PM PGY-1, [facility]  FPTS Intern pager:  [phone], text pages welcome Secure chat group [facility] Teaching Service   I was personally present and performed or re-performed the history, physical exam and medical decision making activities of this service and have verified that the service and findings are accurately documented in the resident's note.  [facility], MD                  08/04/2021, 9:38 PM

## 2021-08-04 NOTE — ED Notes (Signed)
EDPA into room 

## 2021-08-04 NOTE — ED Provider Notes (Signed)
[facility] EMERGENCY DEPARTMENT Provider Note   CSN: [id number] Arrival date & time: 08/04/21  1448     History  Chief Complaint  Patient presents with   Emesis    Dawn [name] is a 21 y.o. female.  Patient with history of type 1 diabetes presents today with complaints of nausea, vomiting, and abdominal pain. She states that same has been ongoing and persistent for the past 2 days. States that her insurance status changed in the last week and she therefore had to change the insulin regimen she was on in order to have her medications covered. She is unable to provide details related to this. States that she is out of test strips and lancets and therefore has been unable to check her sugar for the past several days to dose adjust her short acting insulin, but she has been taking her insulin as prescribed. She denies any diarrhea, is having normal bowel movements. She does state that she has been able to tolerate po. Denies hematuria or dysuria, does endorse some polyuria.   The history is provided by the patient. No language interpreter was used.  Emesis Associated symptoms: abdominal pain        Home Medications Prior to Admission medications   Medication Sig Start Date End Date Taking? Authorizing Provider  insulin lispro (HUMALOG KWIKPEN) 100 UNIT/ML KwikPen Inject 5-15 Units into the skin See admin instructions. 3 times daily with meals- per sliding scale based on carb count 07/08/21 08/07/21 Yes Dameron, Luna Fuse, DO  sertraline (ZOLOFT) 50 MG tablet Take 1.5 tablets (75 mg total) by mouth daily. 03/11/21  Yes Dameron, Luna Fuse, DO  Accu-Chek Softclix Lancets lancets Please use to check blood sugar up to 6 times/day. 04/11/21   Erskine Emery, MD  acetaminophen (TYLENOL) 500 MG tablet Take 1,000 mg by mouth every 6 (six) hours as needed for moderate pain or headache.    [provider]  albuterol (VENTOLIN HFA) 108 (90 Base) MCG/ACT inhaler Inhale 2 puffs into the  lungs every 6 (six) hours as needed for wheezing or shortness of breath. 03/24/21   [name], MD  Blood Glucose Monitoring Suppl (ACCU-CHEK GUIDE ME) w/Device KIT 1 each by Does not apply route 3 (three) times daily before meals. 07/06/21   Maryjane Hurter, MD  Continuous Blood Gluc Transmit (DEXCOM G6 TRANSMITTER) MISC  05/26/21   [provider]  Glucagon (BAQSIMI ONE PACK) 3 MG/DOSE POWD Place 3 mg into the nose once as needed (low blood sugar).    [provider]  glucose blood (ACCU-CHEK GUIDE) test strip Use to check glucose 6x daily 04/11/21   Erskine Emery, MD  ibuprofen (ADVIL) 200 MG tablet Take 400 mg by mouth every 6 (six) hours as needed for headache or moderate pain.    [provider]  insulin glargine (LANTUS SOLOSTAR) 100 UNIT/ML Solostar Pen Inject 30 Units into the skin at bedtime. Patient not taking: Reported on 08/04/2021 07/08/21   Orvis Brill, DO  Insulin Pen Needle (BD PEN NEEDLE NANO 2ND GEN) 32G X 4 MM MISC USE TO INJECT INSULIN 6 TIMES DAILY 04/11/21   Erskine Emery, MD  SUMAtriptan (IMITREX) 25 MG tablet Take 0.5 tablets (12.5 mg total) by mouth every 2 (two) hours as needed for migraine. May repeat in 2 hours if headache persists or recurs. Patient taking differently: Take 25 mg by mouth every 2 (two) hours as needed for migraine. May repeat in 2 hours if headache persists or recurs. 04/12/21  [name], MD      Allergies    Latex, Tape, and Shellfish allergy    Review of Systems   Review of Systems  Gastrointestinal:  Positive for abdominal pain, nausea and vomiting.  All other systems reviewed and are negative.   Physical Exam Updated Vital Signs BP (!) 129/93 (BP Location: Right Arm)   Pulse (!) 127   Temp 98 F (36.7 C) (Oral)   Resp 18   LMP 05/21/2021 (Approximate) Comment: states she was spotting  SpO2 100%  Physical Exam Vitals and nursing note reviewed.  Constitutional:      General: She is not in acute distress.     Appearance: Normal appearance. She is normal weight. She is not ill-appearing, toxic-appearing or diaphoretic.  HENT:     Head: Normocephalic and atraumatic.  Cardiovascular:     Rate and Rhythm: Normal rate and regular rhythm.     Heart sounds: Normal heart sounds.  Pulmonary:     Effort: Pulmonary effort is normal. No respiratory distress.     Breath sounds: Normal breath sounds.  Abdominal:     General: Abdomen is flat.     Palpations: Abdomen is soft.     Tenderness: There is abdominal tenderness.  Musculoskeletal:        General: Normal range of motion.     Cervical back: Normal range of motion.  Skin:    General: Skin is warm and dry.  Neurological:     General: No focal deficit present.     Mental Status: She is alert.  Psychiatric:        Mood and Affect: Mood normal.        Behavior: Behavior normal.     ED Results / Procedures / Treatments   Labs (all labs ordered are listed, but only abnormal results are displayed) Labs Reviewed  BETA-HYDROXYBUTYRIC ACID - Abnormal; Notable for the following components:      Result Value   Beta-Hydroxybutyric Acid 6.00 (*)    All other components within normal limits  COMPREHENSIVE METABOLIC PANEL - Abnormal; Notable for the following components:   Sodium 130 (*)    Chloride 97 (*)    CO2 14 (*)    Glucose, Bld 348 (*)    Creatinine, Ser 1.40 (*)    Total Protein 9.3 (*)    Alkaline Phosphatase 152 (*)    Total Bilirubin 1.6 (*)    GFR, Estimated 55 (*)    Anion gap 19 (*)    All other components within normal limits  CBG MONITORING, ED - Abnormal; Notable for the following components:   Glucose-Capillary 365 (*)    All other components within normal limits  I-STAT VENOUS BLOOD GAS, ED - Abnormal; Notable for the following components:   pH, Ven 7.233 (*)    pCO2, Ven 31.2 (*)    pO2, Ven 31 (*)    Bicarbonate 13.2 (*)    TCO2 14 (*)    Acid-base deficit 13.0 (*)    Sodium 130 (*)    HCT 48.0 (*)    Hemoglobin  16.3 (*)    All other components within normal limits  I-STAT CHEM 8, ED - Abnormal; Notable for the following components:   Sodium 132 (*)    BUN 23 (*)    Glucose, Bld 356 (*)    TCO2 14 (*)    Hemoglobin 16.3 (*)    HCT 48.0 (*)    All other components within normal limits  CBG  MONITORING, ED - Abnormal; Notable for the following components:   Glucose-Capillary 263 (*)    All other components within normal limits  CBC WITH DIFFERENTIAL/PLATELET  LIPASE, BLOOD  URINALYSIS, ROUTINE W REFLEX MICROSCOPIC  I-STAT BETA HCG BLOOD, ED ([facility], [facility], [facility] ONLY)    EKG None  Radiology No results found.  Procedures .Critical Care  Performed by: Bud Face, PA-C Authorized by: Bud Face, PA-C   Critical care provider statement:    Critical care time (minutes):  35   Critical care start time:  08/04/2021 6:30 PM   Critical care end time:  08/04/2021 7:35 PM   Critical care was necessary to treat or prevent imminent or life-threatening deterioration of the following conditions:  Metabolic crisis, endocrine crisis and dehydration   Critical care was time spent personally by me on the following activities:  Development of treatment plan with patient or surrogate, discussions with primary provider, evaluation of patient's response to treatment, examination of patient, obtaining history from patient or surrogate, ordering and review of laboratory studies, pulse oximetry, re-evaluation of patient's condition and review of old charts   I assumed direction of critical care for this patient from another provider in my specialty: no     Care discussed with: admitting provider       Medications Ordered in ED Medications  insulin regular, human (MYXREDLIN) 100 units/ 100 mL infusion (has no administration in time range)  lactated ringers infusion (has no administration in time range)  dextrose 5 % in lactated ringers infusion (has no administration in time range)  dextrose 50 % solution 0-50 mL  (has no administration in time range)  potassium chloride 10 mEq in 100 mL IVPB (has no administration in time range)  lactated ringers bolus 1,000 mL (0 mLs Intravenous Stopped 08/04/21 1812)  ondansetron (ZOFRAN) injection 4 mg (4 mg Intravenous Given 08/04/21 1717)  lactated ringers bolus 1,000 mL (1,000 mLs Intravenous New Bag/Given 08/04/21 2001)    ED Course/ Medical Decision Making/ A&P                           Medical Decision Making  This patient presents to the ED for concern of nausea, vomiting, and abdominal pain, this involves an extensive number of treatment options, and is a complaint that carries with it a high risk of complications and morbidity.  The differential diagnosis includes DKA, gastroparesis, appendicitis, cholecystitis. This is not an exhaustive differential   Co morbidities that complicate the patient evaluation  Hx T1DM, DKA   Additional history obtained:  Additional history obtained from epic chart review   Lab Tests:  I Ordered, and personally interpreted labs.  The pertinent results include:  CBG 365, bhb 6.00, bicarb 14, anion gap 19. Creatinine 1.40 up from 0.50 4 weeks ago. pH 7.233, pCO2 31.2. Findings consistent with DKA with AKI   Problem List / ED Course / Critical interventions / Medication management  I ordered medication including fluids and insulin  for DKA, AKI. Zofran for nausea/vomiting  Reevaluation of the patient after these medicines showed that the patient improved I have reviewed the patients home medicines and have made adjustments as needed   Social Determinants of Health:  Hx homelessness, difficulty attaining medications   Test / Admission - Considered:  Patient presents today with 2 days of nausea, vomiting, and abdominal pain. Hx of DKA several times in the past, labs consistent with same today. Also with AKI likely  from dehydration. Patient will need admission for same. She is understanding and amenable with plan. Does  not need ICU admission at this time.  Discussed patient with hospitalist who agrees to admit   Final Clinical Impression(s) / ED Diagnoses Final diagnoses:  Diabetic ketoacidosis without coma associated with type 1 diabetes mellitus ([place])  AKI (acute kidney injury) [facility])    Rx / DC Orders ED Discharge Orders     None         Nestor Lewandowsky 08/04/21 2019    Lucrezia Starch, MD 08/04/21 [phone]

## 2021-08-04 NOTE — Assessment & Plan Note (Signed)
Acute, likely prerenal due to dehydration caused by nausea and vomiting from DKA. Creatinine 1.4 from baseline of 0.5. -Trend with BMPs -IV fluids -Consider sodium labs if creatinine does improve -UA pending

## 2021-08-04 NOTE — ED Notes (Signed)
Unable to get blood ?

## 2021-08-04 NOTE — ED Notes (Signed)
Next endotool check [id number]

## 2021-08-04 NOTE — ED Notes (Signed)
Unable to get blood/labs due at this time due to access issue. Staff and IV team unable to get secondary access.

## 2021-08-04 NOTE — ED Triage Notes (Signed)
Patient with history of diabetes here complaint nausea, emesis, and hyperglycemia that started two days ago. Patient denies any changes in how she takes her insulin, is alert, oriented, and ill-appearing.

## 2021-08-04 NOTE — Inpatient Diabetes Management (Signed)
Inpatient Diabetes Program Recommendations  AACE/ADA: New Consensus Statement on Inpatient Glycemic Control (2015)  Target Ranges:  Prepandial:   less than 140 mg/dL      Peak postprandial:   less than 180 mg/dL (1-2 hours)      Critically ill patients:  140 - 180 mg/dL   Lab Results  Component Value Date   GLUCAP 365 (H) 08/04/2021   HGBA1C 14.6 (H) 04/10/2021    Review of Glycemic Control  Diabetes history: DM1 (requires basal, meal coverage and correction) Outpatient Diabetes medications: Lantus 30 units qd, Humalog 5-15 units tid meal coverage based on carb count Current orders for Inpatient glycemic control: Pt. In ED  Inpatient Diabetes Program Recommendations:   Patient is well known to Diabetes coordinators. DM coordinator spoke with patient on last admission 07/05/21 and patient was homeless @ this time.  No labs available yet. Will follow and assist as needed.  Thank you, Dawn [name]. Dawn Hines, RN, MSN, CDE  Diabetes Coordinator Inpatient Glycemic Control Team Team Pager [phone] (8am-5pm) 08/04/2021 3:33 PM

## 2021-08-05 ENCOUNTER — Other Ambulatory Visit (HOSPITAL_COMMUNITY): Payer: Self-pay

## 2021-08-05 DIAGNOSIS — E1010 Type 1 diabetes mellitus with ketoacidosis without coma: Principal | ICD-10-CM

## 2021-08-05 DIAGNOSIS — N179 Acute kidney failure, unspecified: Secondary | ICD-10-CM | POA: Diagnosis not present

## 2021-08-05 DIAGNOSIS — F331 Major depressive disorder, recurrent, moderate: Secondary | ICD-10-CM | POA: Diagnosis not present

## 2021-08-05 LAB — BASIC METABOLIC PANEL
Anion gap: 9 (ref 5–15)
Anion gap: 14 (ref 5–15)
BUN: 9 mg/dL (ref 6–20)
BUN: 9 mg/dL (ref 6–20)
CO2: 22 mmol/L (ref 22–32)
CO2: 21 mmol/L — ABNORMAL LOW (ref 22–32)
Calcium: 9.1 mg/dL (ref 8.9–10.3)
Calcium: 9.6 mg/dL (ref 8.9–10.3)
Chloride: 107 mmol/L (ref 98–111)
Chloride: 105 mmol/L (ref 98–111)
Creatinine, Ser: 0.69 mg/dL (ref 0.44–1.00)
Creatinine, Ser: 0.71 mg/dL (ref 0.44–1.00)
GFR, Estimated: >60 mL/min (ref >60)
GFR, Estimated: >60 mL/min (ref >60)
Glucose, Bld: 85 mg/dL (ref 70–99)
Glucose, Bld: 207 mg/dL — ABNORMAL HIGH (ref 70–99)
Potassium: 3.3 mmol/L — ABNORMAL LOW (ref 3.5–5.1)
Potassium: 3.1 mmol/L — ABNORMAL LOW (ref 3.5–5.1)
Sodium: 138 mmol/L (ref 135–145)
Sodium: 140 mmol/L (ref 135–145)

## 2021-08-05 LAB — BETA-HYDROXYBUTYRIC ACID
Beta-Hydroxybutyric Acid: 1.72 mmol/L — ABNORMAL HIGH (ref 0.05–0.27)
Beta-Hydroxybutyric Acid: 0.3 mmol/L — ABNORMAL HIGH (ref 0.05–0.27)

## 2021-08-05 LAB — GLUCOSE, CAPILLARY
Glucose-Capillary: 116 mg/dL — ABNORMAL HIGH (ref 70–99)
Glucose-Capillary: 134 mg/dL — ABNORMAL HIGH (ref 70–99)
Glucose-Capillary: 182 mg/dL — ABNORMAL HIGH (ref 70–99)
Glucose-Capillary: 279 mg/dL — ABNORMAL HIGH (ref 70–99)
Glucose-Capillary: 58 mg/dL — ABNORMAL LOW (ref 70–99)
Glucose-Capillary: 69 mg/dL — ABNORMAL LOW (ref 70–99)
Glucose-Capillary: 77 mg/dL (ref 70–99)
Glucose-Capillary: 85 mg/dL (ref 70–99)
Glucose-Capillary: 88 mg/dL (ref 70–99)
Glucose-Capillary: 91 mg/dL (ref 70–99)

## 2021-08-05 MED ORDER — LANTUS SOLOSTAR 100 UNIT/ML ~~LOC~~ SOPN
30.0000 [IU] | PEN_INJECTOR | Freq: Every day | SUBCUTANEOUS | 11 refills | Status: DC
  Filled 2021-08-05: qty 15, 50d supply, fill #0

## 2021-08-05 MED ORDER — INSULIN LISPRO (1 UNIT DIAL) 100 UNIT/ML (KWIKPEN)
5.0000 [IU] | PEN_INJECTOR | SUBCUTANEOUS | 0 refills | Status: DC
  Filled 2021-08-05: qty 9, 30d supply, fill #0

## 2021-08-05 MED ORDER — ACCU-CHEK GUIDE ME W/DEVICE KIT
1.0000 | PACK | Freq: Three times a day (TID) | 0 refills | Status: DC
  Filled 2021-08-05: qty 1, 30d supply, fill #0

## 2021-08-05 MED ORDER — POTASSIUM CHLORIDE CRYS ER 20 MEQ PO TBCR
40.0000 meq | EXTENDED_RELEASE_TABLET | Freq: Two times a day (BID) | ORAL | Status: DC
  Administered 2021-08-05: 40 meq via ORAL
  Filled 2021-08-05 (×2): qty 2

## 2021-08-05 MED ORDER — INSULIN GLARGINE-YFGN 100 UNIT/ML ~~LOC~~ SOLN
20.0000 [IU] | Freq: Every day | SUBCUTANEOUS | Status: DC
  Administered 2021-08-05: 20 [IU] via SUBCUTANEOUS
  Filled 2021-08-05: qty 0.2

## 2021-08-05 MED ORDER — ACCU-CHEK GUIDE VI STRP
ORAL_STRIP | 1 refills | Status: DC
  Filled 2021-08-05: qty 200, 33d supply, fill #0
  Filled 2021-09-05: qty 200, 33d supply, fill #1

## 2021-08-05 MED ORDER — BD PEN NEEDLE NANO 2ND GEN 32G X 4 MM MISC
5 refills | Status: DC
  Filled 2021-08-05: qty 200, 33d supply, fill #0
  Filled 2021-09-05: qty 200, 33d supply, fill #1

## 2021-08-05 MED ORDER — BLOOD GLUCOSE METER KIT
PACK | 0 refills | Status: DC
  Filled 2021-08-05: qty 1, fill #0
  Filled 2021-09-05: qty 1, 1d supply, fill #0

## 2021-08-05 MED ORDER — INSULIN GLARGINE-YFGN 100 UNIT/ML ~~LOC~~ SOLN
10.0000 [IU] | Freq: Once | SUBCUTANEOUS | Status: AC
  Administered 2021-08-05: 10 [IU] via SUBCUTANEOUS
  Filled 2021-08-05: qty 0.1

## 2021-08-05 MED ORDER — POTASSIUM CHLORIDE CRYS ER 20 MEQ PO TBCR
40.0000 meq | EXTENDED_RELEASE_TABLET | Freq: Once | ORAL | Status: AC
  Administered 2021-08-05: 40 meq via ORAL
  Filled 2021-08-05: qty 2

## 2021-08-05 MED ORDER — ACCU-CHEK SOFTCLIX LANCETS MISC
13 refills | Status: DC
  Filled 2021-08-05: qty 200, 33d supply, fill #0
  Filled 2021-09-05 – 2021-09-08 (×2): qty 200, 33d supply, fill #1

## 2021-08-05 MED ORDER — LANTUS SOLOSTAR 100 UNIT/ML ~~LOC~~ SOPN
30.0000 [IU] | PEN_INJECTOR | Freq: Every day | SUBCUTANEOUS | 11 refills | Status: DC
  Filled 2021-08-05 – 2021-09-05 (×2): qty 15, 50d supply, fill #0

## 2021-08-05 MED ORDER — POTASSIUM CHLORIDE CRYS ER 20 MEQ PO TBCR: 40.0000 meq | EXTENDED_RELEASE_TABLET | Freq: Two times a day (BID) | ORAL | Status: DC

## 2021-08-05 MED ORDER — INSULIN ASPART 100 UNIT/ML IJ SOLN
0.0000 [IU] | Freq: Three times a day (TID) | INTRAMUSCULAR | Status: DC
  Administered 2021-08-05: 2 [IU] via SUBCUTANEOUS
  Administered 2021-08-05: 5 [IU] via SUBCUTANEOUS

## 2021-08-05 MED ORDER — INSULIN ASPART 100 UNIT/ML IJ SOLN
4.0000 [IU] | Freq: Three times a day (TID) | INTRAMUSCULAR | Status: DC
Start: 2021-08-05 — End: 2021-08-05
  Administered 2021-08-05 (×2): 4 [IU] via SUBCUTANEOUS

## 2021-08-05 MED ORDER — FREESTYLE LIBRE 2 SENSOR MISC: 1.0000 [IU] | 0 refills | Status: DC

## 2021-08-05 NOTE — Inpatient Diabetes Management (Signed)
Inpatient Diabetes Program Recommendations  AACE/ADA: New Consensus Statement on Inpatient Glycemic Control (2015)  Target Ranges:  Prepandial:   less than 140 mg/dL      Peak postprandial:   less than 180 mg/dL (1-2 hours)      Critically ill patients:  140 - 180 mg/dL   Lab Results  Component Value Date   GLUCAP 279 (H) 08/05/2021   HGBA1C 14.6 (H) 04/10/2021    Review of Glycemic Control  Diabetes history: DM1 (requires basal, meal coverage and correction) Outpatient Diabetes medications: Lantus 30 units qd, Humalog 5-15 units tid meal coverage based on carb count Current orders for Inpatient glycemic control: Semglee 20 units qd, Novolog 4 units tid meal coverage, Novolog 0-9 units tid  Inpatient Diabetes Program Recommendations:   Spoke with patient @ bedside who is well known to diabetes coordinators. Patient reports recently moved in with her sister which she states is not the best place but a place for her to stay @ this time. States she did not start her insulin pump which was prescribed by Dr. Shawnee Knapp and has appointment with him next week. Patient moved her bags of belongings to her dads place so she plans to ask her dad to help her find the insulin pump. Also says Medicaid is not covering her glucometer and strips.  Transition of care consult regarding Medicaid covering supplies and patient requests information on disability application.  Patient states she is taking her insulin but has not kept in a refrigerated area due to being homeless so will need new supplies of insulin along with glucometer and strips. Lantus pens [id number] Humalog pens [id number] Meter & supplies [phone] Josephine Igo [id number]  Thank you, Billy Fischer. Bethanie Bloxom, RN, MSN, CDE  Diabetes Coordinator Inpatient Glycemic Control Team Team Pager [phone] (8am-5pm) 08/05/2021 10:15 AM

## 2021-08-05 NOTE — Discharge Instructions (Signed)
Dear Dawn [name],  Thank you for letting us participate in your care. You were hospitalized for high blood sugars and diagnosed with Diabetic ketoacidosis without coma associated with type 1 diabetes mellitus (HCC). You were treated with insulin and IV fluids.   POST-HOSPITAL & CARE INSTRUCTIONS Please continue your insulin regimen as prescribed. Please continue checking your blood sugars. Go to your follow up appointments (listed below)   DOCTOR'S APPOINTMENT   Future Appointments  Date Time Provider Department Center  08/09/2021  9:50 AM ACCESS TO CARE POOL [id number]    Follow-up Information     Dameron, Nolberto Hanlon, DO Follow up.   Specialty: Family Medicine Contact information: [address] [place] [address] [phone]                 Take care and be well!  Family Medicine Teaching Service Inpatient Team [place]  [facility]  [address] [place], [place] [address] [phone]

## 2021-08-05 NOTE — Progress Notes (Addendum)
Hypoglycemic Event  CBG: 58  Treatment: 4 oz juice/soda  Symptoms: None  Follow-up CBG: Time:69 CBG Result:134  Possible Reasons for Event: NPO and insulin drip   Comments/MD notified:  MD notified    Charlotte Sanes

## 2021-08-05 NOTE — TOC Transition Note (Addendum)
Transition of Care [facility]) - CM/SW Discharge Note   Patient Details  Name: Dawn [name] MRN: [id number] Date of Birth: 04-21-2000  [facility] CM/SW Contact:  Sharin Mons, RN Phone Number: 08/05/2021, 3:05 PM   Clinical Narrative:    Patient will DC to: home  Anticipated DC date: 08/05/2021 Family notified: yes Transport by: car  Per MD patient ready for DC today. RN, and patient notified of DC. NCM spoke with pt regarding d/c planning. Pt  states from home with sister. States not homeless and plans to d/c back to sister place of residence.States she and sister had fallen out in the past and she wasn't allowed to get her medicine ( insulin), however, states relationship is better now. States works full-time with [employer] and [employer].    Consult received: Patient states Medicaid is not covering strips for glucometer. Also wants to start process for disability.  Pt will d/c to home with glucose kit and supplies provided by [facility] pharmacy , along with insulin. Pt instructed to go to Social Services regarding disability concerns.  Post hospital f/u noted on AVS. Hospital Follow Up Monday Aug 09, 2021 9:50 AM Please arrive 15 minutes prior to your appointment. [place] [address]  [id number] [place] [address] [phone]   Transportation to home will be provided by boyfriend.  08/05/2021 1600 Pt states received text from [place] her she has a prescription filled for Dexcom, no cost. Pt states will pick up device once d/c. Per d/c instruction pt is to stop Dexcom and use glucometer. Information reinforced by nurse.  RNCM will sign off for now as intervention is no longer needed. Please consult Dawn again if new needs arise.   Final next level of care: Home/Self Care Barriers to Discharge: No Barriers Identified   Patient Goals and CMS Choice        Discharge Placement                        Discharge Plan and Services   Discharge Planning Services: CM Consult                                 Social Determinants of Health (SDOH) Interventions     Readmission Risk Interventions    12/19/2019    1:16 PM  Readmission Risk Prevention Plan  Transportation Screening Complete  PCP or Specialist Appt within 3-5 Days Complete  HRI or [place] Complete  Social Work Consult for [place] Planning/Counseling Complete  Palliative Care Screening Not Applicable  Medication Review Press photographer) Complete

## 2021-08-05 NOTE — Hospital Course (Addendum)
Dawn [name] is a 21 year old female who presented with nausea and vomiting, found to be in DKA.  Patient has pertinent past medical history of uncontrolled type 1 diabetes.  Patient was admitted to the family medicine teaching service for diabetic ketoacidosis.  Please see problem based hospital course below:  Diabetic ketoacidosis associated with type 1 diabetes Patient presented to the ED with nausea and vomiting.  Patient did not have altered mental status.  Lab work showed elevated anion gap metabolic acidosis and glucose elevated to 350s, pH 7.233. Patient was given two lactated ringer boluses and IV potassium supplementation.  Patient was then started on regular insulin drip, Endo tool, and IV lactated Ringer's fluids with dextrose.  Basic metabolites were trended until anion gap was within normal limits.  Patient was then transitioned to her home subcutaneous insulin of 30U basal with an additional 4U of mealtime insulin and sSSI. Was evaluated by inpatient diabetes coordinator and sent home with continuous glucose monitor sample but will need prescription outpatient for additional refills. She was also provided with insulin and meter/supplies.  Acute kidney injury Creatinine was significantly elevated to 1.4 from baseline of 0.5 on admission.  Intravenous fluids were started.  Creatinine resolved to 0.71 at discharge.  Discharge Recommendations Patient will need close follow-up to ensure she is monitoring her blood sugars and adhering to her insulin regimen Hgb A1c still pending at time of discharge  Noted to have housing insecurity, consider consult to CCM for resources. Hypokalemic to 3.1 on d/c but provided with 40 mEq x2 prior to d/c. Consider rechecking BMP.

## 2021-08-05 NOTE — Discharge Summary (Addendum)
[name] Discharge Summary  Patient name: Dawn [name] Medical record number: [id number] Date of birth: 2000-09-04 Age: 21 y.o. Gender: female Date of Admission: 08/04/2021  Date of Discharge: 08/05/21 Admitting Physician: Salvadore Oxford, MD  Primary Care Provider: Orvis Brill, DO Consultants: none  Indication for Hospitalization: Diabetic Ketoacidosis  Brief Hospital Course:  Dawn [name] is a 21 year old female who presented with nausea and vomiting, found to be in DKA.  Patient has pertinent past medical history of uncontrolled type 1 diabetes.  Patient was admitted to the family medicine teaching service for diabetic ketoacidosis.  Please see problem based hospital course below:  Diabetic ketoacidosis associated with type 1 diabetes Patient presented to the ED with nausea and vomiting.  Patient did not have altered mental status.  Lab work showed elevated anion gap metabolic acidosis and glucose elevated to 350s, pH 7.233. Patient was given two lactated ringer boluses and IV potassium supplementation.  Patient was then started on regular insulin drip, Endo tool, and IV lactated Ringer's fluids with dextrose.  Basic metabolites were trended until anion gap was within normal limits.  Patient was then transitioned to her home subcutaneous insulin of 30U basal with an additional 4U of mealtime insulin and sSSI. Was evaluated by inpatient diabetes coordinator and sent home with continuous glucose monitor sample but will need prescription outpatient for additional refills. She was also provided with insulin and meter/supplies.  Acute kidney injury Creatinine was significantly elevated to 1.4 from baseline of 0.5 on admission.  Intravenous fluids were started.  Creatinine resolved to 0.71 at discharge.  Discharge Recommendations Patient will need close follow-up to ensure she is monitoring her blood sugars and adhering to her insulin regimen Hgb A1c still  pending at time of discharge  Noted to have housing insecurity, consider consult to CCM for resources. Hypokalemic to 3.1 on d/c but provided with 40 mEq x2 prior to d/c. Consider rechecking BMP.  Discharge Diagnoses/Problem List:  Principal Problem for Admission: Diabetic Ketoacidosis Other Problems addressed during stay:  Principal Problem:   Diabetic ketoacidosis without coma associated with type 1 diabetes mellitus (HCC) Active Problems:   AKI (acute kidney injury) ([place])   MDD (major depressive disorder), recurrent episode, moderate ([place])  Disposition: Home with sister  Discharge Condition: stable  Discharge Exam:  Vitals:   08/05/21 0530 08/05/21 0826  BP: 109/85 112/86  Pulse: 80 95  Resp: 18 15  Temp: 98.2 F (36.8 C) 98 F (36.7 C)  SpO2: 100% 100%   General: Sleepy, takes several seconds to respond Cardiovascular: RRR, no murmur Respiratory: CTAB, normal WOB Abdomen: Soft, NT/ND  Significant Procedures: n/a  Significant Labs and Imaging:  Recent Labs  Lab 08/04/21 1700 08/04/21 1716  WBC 7.7  --   HGB 13.9 16.3*  16.3*  HCT 43.8 48.0*  48.0*  PLT 333  --    Recent Labs  Lab 08/04/21 1700 08/04/21 1716 08/04/21 2306 08/05/21 0431 08/05/21 1023  NA 130* 132*  130* 136 138 140  K 4.3 4.5  4.5 4.0 3.3* 3.1*  CL 97* 102 105 107 105  CO2 14*  --  19* 22 21*  GLUCOSE 348* 356* 120* 85 207*  BUN 18 23* _0 CREATININE 1.40* 0.60 0.77 0.69 0.71  CALCIUM 9.7  --  8.9 9.1 9.6  ALKPHOS 152*  --   --   --   --   AST 19  --   --   --   --  ALT 26  --   --   --   --   ALBUMIN 4.1  --   --   --   --      Results/Tests Pending at Time of Discharge: Hgb A1c  Discharge Medications:  Allergies as of 08/05/2021       Reactions   Latex Itching, Swelling, Other (See Comments)   Causes skin irritation   Tape Other (See Comments)   Causes skin irritation   Shellfish Allergy Itching, Rash, Other (See Comments)   Reaction to scallops         Medication List     STOP taking these medications    Dexcom G6 Transmitter Misc       TAKE these medications    Accu-Chek Guide test strip Generic drug: glucose blood Use to check glucose 6x daily   Accu-Chek Guide w/Device Kit Use 3 (three) times daily before meals.   Accu-Chek Softclix Lancets lancets Please use to check blood sugar up to 6 times/day.   acetaminophen 500 MG tablet Commonly known as: TYLENOL Take 1,000 mg by mouth every 6 (six) hours as needed for moderate pain or headache.   albuterol 108 (90 Base) MCG/ACT inhaler Commonly known as: VENTOLIN HFA Inhale 2 puffs into the lungs every 6 (six) hours as needed for wheezing or shortness of breath.   Baqsimi One Pack 3 MG/DOSE Powd Generic drug: Glucagon Place 3 mg into the nose once as needed (low blood sugar).   blood glucose meter kit and supplies Dispense based on patient and insurance preference. Use up to four times daily as directed. (FOR ICD-10 E10.9, E11.9).   FreeStyle Libre 2 Sensor Misc 1 Units by Does not apply route every 14 (fourteen) days. What changed: See the new instructions.   HumaLOG KwikPen 100 UNIT/ML KwikPen Generic drug: insulin lispro Inject 5-15 Units into the skin 3 times daily with meals- per sliding scale based on carb count. (See admin instructions.) What changed:  additional instructions Another medication with the same name was removed. Continue taking this medication, and follow the directions you see here.   ibuprofen 200 MG tablet Commonly known as: ADVIL Take 400 mg by mouth every 6 (six) hours as needed for headache or moderate pain.   Lantus SoloStar 100 UNIT/ML Solostar Pen Generic drug: insulin glargine Inject 30 Units into the skin daily. What changed: Another medication with the same name was removed. Continue taking this medication, and follow the directions you see here.   Pentips 32G X 4 MM Misc Generic drug: Insulin Pen Needle USE TO INJECT INSULIN  6 TIMES DAILY   PRENATAL PO Take 1 tablet by mouth daily.   sertraline 25 MG tablet Commonly known as: ZOLOFT Take 25 mg by mouth daily.   sertraline 50 MG tablet Commonly known as: ZOLOFT Take 1.5 tablets (75 mg total) by mouth daily.   SUMAtriptan 25 MG tablet Commonly known as: Imitrex Take 0.5 tablets (12.5 mg total) by mouth every 2 (two) hours as needed for migraine. May repeat in 2 hours if headache persists or recurs.         Discharge Instructions: Please refer to Patient Instructions section of EMR for full details.  Patient was counseled important signs and symptoms that should prompt return to medical care, changes in medications, dietary instructions, activity restrictions, and follow up appointments.   Follow-Up Appointments:  Follow-up Information     Dameron, Luna Fuse, DO Follow up.   Specialty: Family Medicine Contact information: [phone]  [place] [address] [phone]         Donney Dice, DO Follow up on 08/09/2021.   Specialty: Family Medicine Why: Please go to your scheduled appointment at 9:50am Contact information: [place] [address] [phone]                 August Albino, MD 08/05/2021, 12:26 PM PGY-1, [place] Upper-Level Resident Addendum   I have independently interviewed and examined the patient. I have discussed the above with the original author and agree with their documentation. My edits for correction/addition/clarification are included where appropriate. Please see also any attending notes.   Sharion Settler, DO PGY-3, [place] [facility] 08/05/2021 3:03 PM  FPTS Service pager: [phone] (text pages welcome through [place])

## 2021-08-05 NOTE — Progress Notes (Signed)
Per Physician on-call, continue to run insulin drip on 0.9 with patient's CBG of 116.

## 2021-08-05 NOTE — Plan of Care (Addendum)
Discharge education completed with patient. Has no further questions. IV removed and taken off telemetry. Pt left with her boyfriend and all belongings were taken with patient.   Problem: Education: Goal: Ability to describe self-care measures that may prevent or decrease complications (Diabetes Survival Skills Education) will improve Outcome: Progressing   Problem: Coping: Goal: Ability to adjust to condition or change in health will improve Outcome: Progressing   Problem: Fluid Volume: Goal: Ability to maintain a balanced intake and output will improve Outcome: Progressing   Problem: Health Behavior/Discharge Planning: Goal: Ability to identify and utilize available resources and services will improve Outcome: Progressing Goal: Ability to manage health-related needs will improve Outcome: Progressing   Problem: Metabolic: Goal: Ability to maintain appropriate glucose levels will improve Outcome: Progressing   Problem: Skin Integrity: Goal: Risk for impaired skin integrity will decrease Outcome: Progressing   Problem: Tissue Perfusion: Goal: Adequacy of tissue perfusion will improve Outcome: Progressing   Problem: Nutrition: Goal: Adequate nutrition will be maintained Outcome: Progressing   Problem: Coping: Goal: Level of anxiety will decrease Outcome: Progressing

## 2021-08-05 NOTE — Plan of Care (Signed)
  Problem: Education: Goal: Ability to describe self-care measures that may prevent or decrease complications (Diabetes Survival Skills Education) will improve Outcome: Progressing   Problem: Coping: Goal: Ability to adjust to condition or change in health will improve Outcome: Progressing   Problem: Health Behavior/Discharge Planning: Goal: Ability to identify and utilize available resources and services will improve Outcome: Progressing   Problem: Metabolic: Goal: Ability to maintain appropriate glucose levels will improve Outcome: Progressing   Problem: Nutritional: Goal: Maintenance of adequate nutrition will improve Outcome: Progressing   Problem: Skin Integrity: Goal: Risk for impaired skin integrity will decrease Outcome: Progressing

## 2021-08-05 NOTE — Progress Notes (Signed)
Daily Progress Note Intern Pager: [phone]  Patient name: Dawn [name] Medical record number: [id number] Date of birth: [birth year] Age: 21 y.o. Gender: female  Primary Care Provider: Darral Dash, DO Consultants: none Code Status: full code  Pt Overview and Major Events to Date:  8/2 - admitted  Assessment and Plan:  Dawn [name] is a 21yo F p/w nausea and vomiting now being managed for DKA. Pt with recent inability to adhere to her insulin regimen or check sugars at home. Anion gap closed, currently transitioning off insulin drip and working to set her up for success once she leaves the hospital. Pertinent PMH/PSH includes T1DM.    * Diabetic ketoacidosis without coma associated with type 1 diabetes mellitus (HCC) Acute, stable. Patient is without altered mental status. Anion gap elevated to 19 and bicarb of 14 on admission -> AG 9, bicarb 22. This is likely the result of patient's insurance problems and inability to track and correct her sugar. Ideally send home with CGM. -Diabetes coordinator following -Transitioning from insulin gtt to subq semglee 20u qd, novolog 4u TID -LR infusion 133ml/hr with dextrose when CBG <250 and with out dextrose for CBG >250 -Dextrose 50% prn IV for low CBG -BMP q4h -K x2 today -CBG monitoring -Beta-hydroxbutyric acid q8h -A1c  -UA pending -Cardiac monitoring -Strict I/O -Vitals per floor -Carb modified diet  AKI (acute kidney injury) (HCC) Acute, likely prerenal due to dehydration caused by nausea and vomiting from DKA. Creatinine 1.4 -> 0.69 (baseline 0.5). -Trend with BMPs -IV fluids as above -UA pending  MDD (major depressive disorder), recurrent episode, moderate (HCC) -Continue Zoloft 75mg  daily       FEN/GI: LR IVF w/w/o dextrose as above, carb modified diet PPx: lovenox Dispo:Home pending clinical improvement . Barriers include arranging for CGM and outpatient f/u.   Subjective:  Pt feeling well and  improved this morning. Denies pain, nausea, vomiting.   Discussed with patient regarding controlling her sugars at home. Pt expresses understanding that it is necessary for her to check her sugars regularly at home. States that it was initially a cost problem for her but she now has medicaid.   Objective: Temp:  [98 F (36.7 C)-98.2 F (36.8 C)] 98 F (36.7 C) (08/03 0826) Pulse Rate:  [80-127] 95 (08/03 0826) Resp:  [15-20] 15 (08/03 0826) BP: (100-129)/(77-101) 112/86 (08/03 0826) SpO2:  [98 %-100 %] 100 % (08/03 0826) Weight:  [50 kg] 50 kg (08/02 1800) Physical Exam: General: Sleepy, takes several seconds to respond Cardiovascular: RRR, no murmur Respiratory: CTAB, normal WOB Abdomen: Soft, NT/ND  Laboratory: Most recent CBC Lab Results  Component Value Date   WBC 7.7 08/04/2021   HGB 16.3 (H) 08/04/2021   HGB 16.3 (H) 08/04/2021   HCT 48.0 (H) 08/04/2021   HCT 48.0 (H) 08/04/2021   MCV 88.0 08/04/2021   PLT 333 08/04/2021   Most recent BMP    Latest Ref Rng & Units 08/05/2021    4:31 AM  BMP  Glucose 70 - 99 mg/dL 85   BUN 6 - 20 mg/dL 9   Creatinine 10/05/2021 - 4.27 mg/dL 0.62   Sodium 3.76 - 283 mmol/L 138   Potassium 3.5 - 5.1 mmol/L 3.3   Chloride 98 - 111 mmol/L 107   CO2 22 - 32 mmol/L 22   Calcium 8.9 - 10.3 mg/dL 9.1     Other pertinent labs   Pending: A1c     151, MD 08/05/2021, 8:59 AM  PGY-1, [place] Intern pager: [phone], text pages welcome Secure chat group [place]

## 2021-08-06 LAB — HEMOGLOBIN A1C
Hgb A1c MFr Bld: >15.5 % — ABNORMAL HIGH (ref 4.8–5.6)
Mean Plasma Glucose: >398 mg/dL

## 2021-08-09 ENCOUNTER — Ambulatory Visit (INDEPENDENT_AMBULATORY_CARE_PROVIDER_SITE_OTHER): Payer: Medicaid Other | Admitting: Family Medicine

## 2021-08-09 VITALS — BP 95/81 | HR 116 | Ht 62.0 in | Wt 110.1 lb

## 2021-08-09 DIAGNOSIS — Z7251 High risk heterosexual behavior: Secondary | ICD-10-CM | POA: Diagnosis not present

## 2021-08-09 DIAGNOSIS — Z59819 Housing instability, housed unspecified: Secondary | ICD-10-CM | POA: Diagnosis not present

## 2021-08-09 DIAGNOSIS — E1065 Type 1 diabetes mellitus with hyperglycemia: Secondary | ICD-10-CM

## 2021-08-09 DIAGNOSIS — N926 Irregular menstruation, unspecified: Secondary | ICD-10-CM | POA: Diagnosis not present

## 2021-08-09 LAB — POCT URINE PREGNANCY: Preg Test, Ur: NEGATIVE

## 2021-08-09 NOTE — Progress Notes (Signed)
    SUBJECTIVE:   CHIEF COMPLAINT / HPI:   Patient with history of T1DM presents for hospital follow up after being admitted for DKA. Reports that she feels better, just a little tired but otherwise doing well. Has CGM in place so she is able to easily keep track of her glucose levels. Compliant on diabetes regimen of lantus 30 units daily and humalog 5-15 units tid. Glucose levels at home fluctuate with highest being 300s, she knows her problem is snacking late in the night. During the day, levels are better 200s. Does not remember the last time she saw the eye doctor. Goes to podiatry every 3 months to check her feet.   She has been talking to the social worker to get social security to be able to have a stable home. Currently lives with her sister where she is doing well and feels safe.   Also wants to get a pregnancy test because she has been having unprotected intercourse. Denies nausea, vomiting and abdominal pain other than while she was in the hospital with DKA. Not on birth control, she is not interested in getting on birth control because she was on depo injections and caused weight gain. Sister was on nexplanon and had a bad experience. Her other sister had an IUD and did not like that either. LMP 08/03/2021. Menstrual cycles are typically regular but this recent time was a lighter flow than it normally is.   OBJECTIVE:   BP 95/81   Pulse (!) 116   Ht 5\' 2"  (1.575 m)   Wt 110 lb 2 oz (50 kg)   LMP 08/03/2021   SpO2 98%   BMI 20.14 kg/m   General: Patient well-appearing, in no acute distress. CV: RRR, no murmurs or gallops auscultated Resp: CTAB, no wheezing, rales or rhonchi noted Ext: no LE edema noted, distal pulses strong and equal bilaterally  Psych: mood appropriate, very pleasant, denies SI or HI   ASSESSMENT/PLAN:   Uncontrolled type 1 diabetes mellitus with hyperglycemia (HCC) -A1c >15.5 -continue current insulin regimen -discussed DM diagnosis and possible  complications extensively  -doing better since recent discharge from hospital, we discussed that this is an ongoing condition that she will need to continue to maintain close follow up and stay on top of her regimen to prevent future hospitalizations, reassurance provided  -ophthalmology referral placed, discussed importance of yearly screenings  -follow up in 3-4 weeks with PCP  Housing insecurity -CCM referral placed, has a safe living situation in the interim where she feels safe   Unprotected sexual intercourse -Upreg negative, notified patient of result -instructed to take prenatal vitamin initially prior to negative result but encouraged to continue as she is having unprotected intercourse -strongly recommended contraception but patient says she will consider in the future -extensively discussed safe sex practices   -Med rec reviewed and updated appropriately. -Contact info verified by patient, the best number to reach her is [phone].     [phone], DO [facility]

## 2021-08-09 NOTE — Patient Instructions (Signed)
It was great seeing you today!  Today we discussed your diabetes. Your A1c was greater than 15.5. Please continue with your diabetes regimen. Please record your glucose levels and bring this to your next visit. I have placed a referral to the ophthalmologist (eye doctor), it is important to have your eyes checked at least once a year because diabetes can cause vision impairment.   We will get blood work to check your potassium level.   I have also placed a referral to the chronic care team to help with getting you social security benefits and housing.   We also did a pregnancy test today, I will let you know of the result of this and your blood work today. Please consider starting birth control, we are happy to discuss more on options whenever you are ready. Please make sure to use protection as this prevents sexually transmitted infections. Take a prenatal vitamin daily.   Please follow up at your next scheduled appointment 3-4 weeks with Dr. Melissa Noon, if anything arises between now and then, please don't hesitate to contact our office.   Thank you for allowing [place] to be a part of your medical care!  Thank you, Dr. Robyne Peers

## 2021-08-09 NOTE — Assessment & Plan Note (Addendum)
-  A1c >15.5 -continue current insulin regimen -discussed DM diagnosis and possible complications extensively  -doing better since recent discharge from hospital, we discussed that this is an ongoing condition that she will need to continue to maintain close follow up and stay on top of her regimen to prevent future hospitalizations, reassurance provided  -ophthalmology referral placed, discussed importance of yearly screenings  -follow up in 3-4 weeks with PCP

## 2021-08-09 NOTE — Assessment & Plan Note (Signed)
-  Upreg negative, notified patient of result -instructed to take prenatal vitamin initially prior to negative result but encouraged to continue as she is having unprotected intercourse -strongly recommended contraception but patient says she will consider in the future -extensively discussed safe sex practices

## 2021-08-09 NOTE — Assessment & Plan Note (Signed)
-  CCM referral placed, has a safe living situation in the interim where she feels safe

## 2021-08-10 LAB — BASIC METABOLIC PANEL
BUN/Creatinine Ratio: 25 — ABNORMAL HIGH (ref 9–23)
BUN: 21 mg/dL — ABNORMAL HIGH (ref 6–20)
CO2: 16 mmol/L — ABNORMAL LOW (ref 20–29)
Calcium: 10.4 mg/dL — ABNORMAL HIGH (ref 8.7–10.2)
Chloride: 99 mmol/L (ref 96–106)
Creatinine, Ser: 0.85 mg/dL (ref 0.57–1.00)
Glucose: 112 mg/dL — ABNORMAL HIGH (ref 70–99)
Potassium: 3.8 mmol/L (ref 3.5–5.2)
Sodium: 139 mmol/L (ref 134–144)
eGFR: 101 mL/min/{1.73_m2} (ref >59)

## 2021-08-16 ENCOUNTER — Other Ambulatory Visit: Payer: Self-pay

## 2021-08-16 NOTE — Patient Outreach (Signed)
Medicaid Managed Care Social Work Note  08/16/2021 Name:  Dawn [name] MRN:  [id number] DOB:  Mar 21, 2000  Dawn [name] is an 21 y.o. year old female who is a primary patient of Orvis Brill, DO.  The Medicaid Managed Care Coordination team was consulted for assistance with:  Community Resources   Ms. [place] was given information about Medicaid Managed Care Coordination team services today. Dawn [name] Patient agreed to services and verbal consent obtained.  Engaged with patient  for by telephone forinitial visit in response to referral for case management and/or care coordination services.   Assessments/Interventions:  Review of past medical history, allergies, medications, health status, including review of consultants reports, laboratory and other test data, was performed as part of comprehensive evaluation and provision of chronic care management services.  SDOH: (Social Determinant of Health) assessments and interventions performed: BSW completed telephone outreach with patient. She stated she needs secure housing, she is in and out of living with her sister. Patient states she is [redacted] weeks pregnant with no income. Once she starts working she ends of in the hospital. She wants to apply for disability but does not know where to start. Patient states she has completed an application for income based housing at [facility] and is currently waiting on a decision. Patient does receive foodstamps. BSW provide housing and disability to patients email at [email or web address] Advanced Directives Status:  Not addressed in this encounter.  Care Plan                 Allergies  Allergen Reactions   Latex Itching, Swelling and Other (See Comments)    Causes skin irritation   Tape Other (See Comments)    Causes skin irritation   Shellfish Allergy Itching, Rash and Other (See Comments)    Reaction to scallops    Medications Reviewed Today     Reviewed by Donney Dice, DO (Resident) on  08/09/21 at [place] List Status: <None>   Medication Order Taking? Sig Documenting Provider Last Dose Status Informant  Accu-Chek Softclix Lancets lancets [id number]  Please use to check blood sugar up to 6 times/day. August Albino, MD  Active   acetaminophen (TYLENOL) 500 MG tablet [id number]  Take 1,000 mg by mouth every 6 (six) hours as needed for moderate pain or headache.  Patient not taking: Reported on 08/05/2021   [provider]  Active Self  albuterol (VENTOLIN HFA) 108 (90 Base) MCG/ACT inhaler [id number]  Inhale 2 puffs into the lungs every 6 (six) hours as needed for wheezing or shortness of breath.  Patient not taking: Reported on 08/05/2021   [name], MD  Active Self  blood glucose meter kit and supplies [id number]  Dispense based on patient and insurance preference. Use up to four times daily as directed. (FOR ICD-10 E10.9, E11.9). August Albino, MD  Active   Blood Glucose Monitoring Suppl (ACCU-CHEK GUIDE ME) w/Device KIT [id number]  Use 3 (three) times daily before meals. August Albino, MD  Active   Continuous Blood Gluc Sensor (FREESTYLE LIBRE 2 SENSOR) MISC [id number]  1 Units by Does not apply route every 14 (fourteen) days. August Albino, MD  Active   Glucagon (BAQSIMI ONE PACK) 3 MG/DOSE POWD [id number]  Place 3 mg into the nose once as needed (low blood sugar).  Patient not taking: Reported on 08/05/2021   [provider]  Active Self           Med Note Duffy Bruce, Sherrie Mustache Jul 04, 2021  5:11 PM)    glucose blood (ACCU-CHEK GUIDE) test strip [id number]  Use to check glucose 6x daily August Albino, MD  Active   ibuprofen (ADVIL) 200 MG tablet [id number]  Take 400 mg by mouth every 6 (six) hours as needed for headache or moderate pain.  Patient not taking: Reported on 08/05/2021   [provider]  Active Self  insulin glargine (LANTUS SOLOSTAR) 100 UNIT/ML Solostar Pen [id number] Yes Inject 30 Units into the skin daily. August Albino, MD Taking Active    insulin lispro (HUMALOG KWIKPEN) 100 UNIT/ML KwikPen [id number] Yes Inject 5-15 Units into the skin 3 times daily with meals- per sliding scale based on carb count. (See admin instructions.) August Albino, MD Taking Active   Insulin Pen Needle (BD PEN NEEDLE NANO 2ND GEN) 32G X 4 MM MISC [id number]  USE TO INJECT INSULIN 6 TIMES DAILY August Albino, MD  Active   Prenatal Vit-Fe Fumarate-FA (PRENATAL PO) [id number]  Take 1 tablet by mouth daily. [provider]  Active Self  sertraline (ZOLOFT) 25 MG tablet [id number] Yes Take 25 mg by mouth daily. [provider] Taking Active Self           Med Note (CRUTHIS, CHLOE C   Thu Aug 05, 2021  1:20 PM) No fill hx found for this medication Pt is adamant she is taking both 25 mg and 50 mg daily. Dispense report does not support this claim.   sertraline (ZOLOFT) 50 MG tablet [id number] Yes Take 1.5 tablets (75 mg total) by mouth daily. Orvis Brill, DO Taking Active Self           Med Note (CRUTHIS, CHLOE C   Thu Aug 05, 2021  1:20 PM)  Pt is adamant she is taking both 25 mg and 50 mg daily. Dispense report does not support this claim.   SUMAtriptan (IMITREX) 25 MG tablet [id number] Yes Take 0.5 tablets (12.5 mg total) by mouth every 2 (two) hours as needed for migraine. May repeat in 2 hours if headache persists or recurs. [name], MD Taking Active Self            Patient Active Problem List   Diagnosis Date Noted   Housing insecurity 08/09/2021   Menstrual periods irregular 06/17/2021   Chest wall pain    Diabetic ketoacidosis without coma associated with type 1 diabetes mellitus ([place]) 04/10/2021   Acute pyelonephritis 04/07/2021   Other specified disorders of kidney and ureter 03/17/2021   Encounter for counseling before starting and about pre-exposure prophylaxis for HIV 01/25/2021   DKA, type 1 ([place]) 01/03/2021   AKI (acute kidney injury) ([place])    Nausea and vomiting 11/26/2020   Major depressive disorder    Viral URI  08/27/2020   Leg swelling 08/19/2020   Unprotected sexual intercourse 06/05/2020   Lower abdominal pain 12/24/2019   Tension headache, chronic 12/24/2019   Hx of migraines 07/11/2019   Protein-calorie malnutrition, severe 06/25/2019   Asthma 09/25/2018   Noncompliance with diabetes treatment 07/04/2017   MDD (major depressive disorder), recurrent episode, moderate ([place]) 03/16/2016   Maladaptive health behaviors affecting medical condition 03/24/2015   Poor social situation 07/20/2013   Uncontrolled type 1 diabetes mellitus with hyperglycemia ([place]) 07/07/2013    Conditions to be addressed/monitored per PCP order:   community resources  There are no care plans that you recently modified to display for this patient.   Follow up:  Patient agrees to Care Plan and Follow-up.  Plan: The Managed Medicaid care  management team will reach out to the patient again over the next 14 days.  Date/time of next scheduled Social Work care management/care coordination outreach:  09/03/21  Mickel Fuchs, Arita Miss, [place] Managed Medicaid Team  [phone]

## 2021-08-16 NOTE — Patient Instructions (Addendum)
Visit Information  Ms. [name]  - as a part of your Medicaid benefit, you are eligible for care management and care coordination services at no cost or copay. I was unable to reach you by phone today but would be happy to help you with your health related needs. Please feel free to call me @ [phone]).   A member of the Managed Medicaid care management team will reach out to you again over the next 7 days.   Gus Puma, BSW, [facility]  [place]  High Risk Managed Medicaid Team  [phone] Visit Information  Ms. [place] was given information about Medicaid Managed Care team care coordination services as a part of their Healthy [employer] Medicaid benefit. [name] verbally consented to engagement with the [facility] Managed Care team.   If you are experiencing a medical emergency, please call 911 or report to your local emergency department or urgent care.   If you have a non-emergency medical problem during routine business hours, please contact your provider's office and ask to speak with a nurse.   For questions related to your Healthy [facility] health plan, please call: [phone] or visit the homepage here: [email or web address]  If you would like to schedule transportation through your Healthy [facility] plan, please call the following number at least 2 days in advance of your appointment: [phone]  For information about your ride after you set it up, call Ride Assist at [phone]. Use this number to activate a Will Call pickup, or if your transportation is late for a scheduled pickup. Use this number, too, if you need to make a change or cancel a previously scheduled reservation.  If you need transportation services right away, call [phone]. The after-hours call center is staffed 24 hours to handle ride assistance and urgent reservation requests (including discharges) 365 days a year. Urgent trips  include sick visits, hospital discharge requests and life-sustaining treatment.  Call the [facility] Line at [phone], at any time, 24 hours a day, 7 days a week. If you are in danger or need immediate medical attention call 911.  If you would like help to quit smoking, call 1-800-QUIT-NOW ((323) 746-4844) OR Espaol: 1-855-Djelo-Ya (4-967-591-6384) o para ms informacin haga clic aqu or Text READY to 665-993 to register via text  Dawn [name] - following are the goals we discussed in your visit today:   Goals Addressed   None       Social Worker will follow up in 14 days .   Gus Puma, BSW, [facility]    High Risk Managed Medicaid Team  [phone]   Following is a copy of your plan of care:  There are no care plans that you recently modified to display for this patient.

## 2021-08-16 NOTE — Patient Outreach (Signed)
Care Coordination  08/16/2021  Dawn [name] 19-Feb-2000 [id number]   Medicaid Managed Care   Unsuccessful Outreach Note  08/16/2021 Name: Dawn [name] MRN: [id number] DOB: 09/05/2000  Referred by: Darral Dash, DO Reason for referral : High Risk Managed Medicaid (MM social work Unsuccessful telephone outreach )   An unsuccessful telephone outreach was attempted today. The patient was referred to the case management team for assistance with care management and care coordination.   Follow Up Plan: The care management team will reach out to the patient again over the next 7 days.   Gus Puma, BSW, [facility]  [place]  High Risk Managed Medicaid Team  [phone]

## 2021-08-25 ENCOUNTER — Ambulatory Visit: Payer: Medicaid Other

## 2021-08-31 ENCOUNTER — Ambulatory Visit: Payer: Medicaid Other | Admitting: Student

## 2021-08-31 ENCOUNTER — Encounter (HOSPITAL_COMMUNITY): Payer: Self-pay

## 2021-08-31 ENCOUNTER — Inpatient Hospital Stay (HOSPITAL_COMMUNITY)
Admission: EM | Admit: 2021-08-31 | Discharge: 2021-09-02 | DRG: 638 | Disposition: A | Discharge status: Home / Self Care | Payer: Medicaid Other | Attending: Family Medicine | Admitting: Family Medicine

## 2021-08-31 ENCOUNTER — Other Ambulatory Visit: Payer: Self-pay

## 2021-08-31 ENCOUNTER — Emergency Department (HOSPITAL_COMMUNITY): Payer: Medicaid Other

## 2021-08-31 DIAGNOSIS — N179 Acute kidney failure, unspecified: Secondary | ICD-10-CM | POA: Diagnosis present

## 2021-08-31 DIAGNOSIS — Z91048 Other nonmedicinal substance allergy status: Secondary | ICD-10-CM

## 2021-08-31 DIAGNOSIS — E860 Dehydration: Secondary | ICD-10-CM | POA: Diagnosis present

## 2021-08-31 DIAGNOSIS — F32A Depression, unspecified: Secondary | ICD-10-CM | POA: Diagnosis present

## 2021-08-31 DIAGNOSIS — G43909 Migraine, unspecified, not intractable, without status migrainosus: Secondary | ICD-10-CM | POA: Diagnosis present

## 2021-08-31 DIAGNOSIS — Z91040 Latex allergy status: Secondary | ICD-10-CM

## 2021-08-31 DIAGNOSIS — Z833 Family history of diabetes mellitus: Secondary | ICD-10-CM

## 2021-08-31 DIAGNOSIS — J45909 Unspecified asthma, uncomplicated: Secondary | ICD-10-CM | POA: Diagnosis present

## 2021-08-31 DIAGNOSIS — Z825 Family history of asthma and other chronic lower respiratory diseases: Secondary | ICD-10-CM

## 2021-08-31 DIAGNOSIS — Z9641 Presence of insulin pump (external) (internal): Secondary | ICD-10-CM | POA: Diagnosis present

## 2021-08-31 DIAGNOSIS — N3289 Other specified disorders of bladder: Secondary | ICD-10-CM | POA: Diagnosis present

## 2021-08-31 DIAGNOSIS — R1013 Epigastric pain: Secondary | ICD-10-CM | POA: Diagnosis present

## 2021-08-31 DIAGNOSIS — Z91013 Allergy to seafood: Secondary | ICD-10-CM

## 2021-08-31 DIAGNOSIS — Z79899 Other long term (current) drug therapy: Secondary | ICD-10-CM

## 2021-08-31 DIAGNOSIS — E1110 Type 2 diabetes mellitus with ketoacidosis without coma: Secondary | ICD-10-CM | POA: Diagnosis present

## 2021-08-31 DIAGNOSIS — F331 Major depressive disorder, recurrent, moderate: Secondary | ICD-10-CM | POA: Diagnosis present

## 2021-08-31 DIAGNOSIS — E1010 Type 1 diabetes mellitus with ketoacidosis without coma: Principal | ICD-10-CM | POA: Diagnosis present

## 2021-08-31 DIAGNOSIS — F909 Attention-deficit hyperactivity disorder, unspecified type: Secondary | ICD-10-CM | POA: Diagnosis present

## 2021-08-31 DIAGNOSIS — F4321 Adjustment disorder with depressed mood: Secondary | ICD-10-CM | POA: Diagnosis present

## 2021-08-31 DIAGNOSIS — R3589 Other polyuria: Secondary | ICD-10-CM | POA: Diagnosis present

## 2021-08-31 DIAGNOSIS — E1065 Type 1 diabetes mellitus with hyperglycemia: Secondary | ICD-10-CM

## 2021-08-31 DIAGNOSIS — Z794 Long term (current) use of insulin: Secondary | ICD-10-CM

## 2021-08-31 DIAGNOSIS — Z8249 Family history of ischemic heart disease and other diseases of the circulatory system: Secondary | ICD-10-CM

## 2021-08-31 LAB — URINALYSIS, ROUTINE W REFLEX MICROSCOPIC
Bilirubin Urine: NEGATIVE
Glucose, UA: >=500 mg/dL — AB
Ketones, ur: 80 mg/dL — AB
Leukocytes,Ua: NEGATIVE
Nitrite: NEGATIVE
Protein, ur: NEGATIVE mg/dL
Specific Gravity, Urine: 1.024 (ref 1.005–1.030)
pH: 5 (ref 5.0–8.0)

## 2021-08-31 LAB — COMPREHENSIVE METABOLIC PANEL
ALT: 21 U/L (ref 0–44)
AST: 23 U/L (ref 15–41)
Albumin: 3.6 g/dL (ref 3.5–5.0)
Alkaline Phosphatase: 128 U/L — ABNORMAL HIGH (ref 38–126)
BUN: 24 mg/dL — ABNORMAL HIGH (ref 6–20)
CO2: <7 mmol/L — ABNORMAL LOW (ref 22–32)
Calcium: 9.7 mg/dL (ref 8.9–10.3)
Chloride: 99 mmol/L (ref 98–111)
Creatinine, Ser: 1.4 mg/dL — ABNORMAL HIGH (ref 0.44–1.00)
GFR, Estimated: 55 mL/min — ABNORMAL LOW (ref >60)
Glucose, Bld: 657 mg/dL (ref 70–99)
Potassium: 5 mmol/L (ref 3.5–5.1)
Sodium: 130 mmol/L — ABNORMAL LOW (ref 135–145)
Total Bilirubin: 2 mg/dL — ABNORMAL HIGH (ref 0.3–1.2)
Total Protein: 8.4 g/dL — ABNORMAL HIGH (ref 6.5–8.1)

## 2021-08-31 LAB — BETA-HYDROXYBUTYRIC ACID: Beta-Hydroxybutyric Acid: >8 mmol/L — ABNORMAL HIGH (ref 0.05–0.27)

## 2021-08-31 LAB — CBC
HCT: 43.1 % (ref 36.0–46.0)
Hemoglobin: 13.3 g/dL (ref 12.0–15.0)
MCH: 28.5 pg (ref 26.0–34.0)
MCHC: 30.9 g/dL (ref 30.0–36.0)
MCV: 92.3 fL (ref 80.0–100.0)
Platelets: 377 10*3/uL (ref 150–400)
RBC: 4.67 MIL/uL (ref 3.87–5.11)
RDW: 14.5 % (ref 11.5–15.5)
WBC: 8.5 10*3/uL (ref 4.0–10.5)
nRBC: 0 % (ref 0.0–0.2)

## 2021-08-31 LAB — I-STAT VENOUS BLOOD GAS, ED
Acid-base deficit: 21 mmol/L — ABNORMAL HIGH (ref 0.0–2.0)
Bicarbonate: 5.3 mmol/L — ABNORMAL LOW (ref 20.0–28.0)
Calcium, Ion: 1.26 mmol/L (ref 1.15–1.40)
HCT: 44 % (ref 36.0–46.0)
Hemoglobin: 15 g/dL (ref 12.0–15.0)
O2 Saturation: 97 %
Potassium: 5.1 mmol/L (ref 3.5–5.1)
Sodium: 130 mmol/L — ABNORMAL LOW (ref 135–145)
TCO2: 6 mmol/L — ABNORMAL LOW (ref 22–32)
pCO2, Ven: 15 mmHg — CL (ref 44–60)
pH, Ven: 7.153 — CL (ref 7.25–7.43)
pO2, Ven: 119 mmHg — ABNORMAL HIGH (ref 32–45)

## 2021-08-31 LAB — OSMOLALITY: Osmolality: 334 mOsm/kg (ref 275–295)

## 2021-08-31 LAB — I-STAT BETA HCG BLOOD, ED (MC, WL, AP ONLY): I-stat hCG, quantitative: <5 m[IU]/mL (ref <5)

## 2021-08-31 LAB — CBG MONITORING, ED: Glucose-Capillary: 598 mg/dL (ref 70–99)

## 2021-08-31 MED ORDER — DEXTROSE 50 % IV SOLN: 0.0000 mL | INTRAVENOUS | Status: DC | PRN

## 2021-08-31 MED ORDER — DEXTROSE IN LACTATED RINGERS 5 % IV SOLN: INTRAVENOUS | Status: DC

## 2021-08-31 MED ORDER — INSULIN REGULAR(HUMAN) IN NACL 100-0.9 UT/100ML-% IV SOLN
INTRAVENOUS | Status: DC
  Administered 2021-08-31 (×2): 6 [IU]/h via INTRAVENOUS
  Filled 2021-08-31: qty 100

## 2021-08-31 MED ORDER — FENTANYL CITRATE PF 50 MCG/ML IJ SOSY
50.0000 ug | PREFILLED_SYRINGE | Freq: Once | INTRAMUSCULAR | Status: AC
  Administered 2021-08-31: 50 ug via INTRAVENOUS
  Filled 2021-08-31: qty 1

## 2021-08-31 MED ORDER — LACTATED RINGERS IV SOLN: INTRAVENOUS | Status: DC

## 2021-08-31 MED ORDER — SODIUM CHLORIDE 0.9 % IV BOLUS
1000.0000 mL | Freq: Once | INTRAVENOUS | Status: AC
  Administered 2021-08-31: 1000 mL via INTRAVENOUS

## 2021-08-31 MED ORDER — LACTATED RINGERS IV BOLUS: 20.0000 mL/kg | Freq: Once | INTRAVENOUS | Status: DC

## 2021-08-31 MED ORDER — IOHEXOL 300 MG/ML  SOLN
70.0000 mL | Freq: Once | INTRAMUSCULAR | Status: AC | PRN
  Administered 2021-08-31: 70 mL via INTRAVENOUS

## 2021-08-31 NOTE — ED Provider Triage Note (Signed)
Emergency Medicine Provider Triage Evaluation Note  Dawn [name] , a 21 y.o. female  was evaluated in triage.  Pt complains of multiple complaints.  Reports being type I diabetic with elevated blood sugars.  Issue with her insulin pump.  Also reports some nipple discomfort, abdominal distention and nausea.  Last menstrual period 8/17 for 1 day but then went off.  Would like a pregnancy test.  Review of Systems  Positive: As above Negative: Vomiting or diarrhea  Physical Exam  BP (!) 123/98 (BP Location: Right Arm)   Pulse (!) 120   Temp 99.2 F (37.3 C) (Oral)   Resp 18   Ht 5\' 2"  (1.575 m)   Wt 49.9 kg   LMP 08/03/2021   SpO2 98%   BMI 20.12 kg/m  Gen:   Awake, no distress   Resp:  Normal effort  MSK:   Moves extremities without difficulty  Other:  Distended abdomen, tachycardic, mildly pale  Medical Decision Making  Medically screening exam initiated at 5:53 PM.  Appropriate orders placed.  Dawn [name] was informed that the remainder of the evaluation will be completed by another provider, this initial triage assessment does not replace that evaluation, and the importance of remaining in the ED until their evaluation is complete.  Blood sugars in 500s.  Tachycardic, borderline febrile.  Suspicion for DKA, charge notified that patient needs a room.  Acuity will be changed.   [name], PA-C 08/31/21 1754

## 2021-08-31 NOTE — ED Notes (Signed)
Provider notified of the delay in patient care due to inability to get IV due to difficult vasculature. IV team consult placed at this time and Dr. Benjaman Lobe understanding of the issue. Patient reports that she has had picc lines placed in the past due to difficult vasculature

## 2021-08-31 NOTE — ED Notes (Signed)
CT tech at bedside to transport the patient to CT scan patient  VSS

## 2021-08-31 NOTE — ED Provider Notes (Signed)
  Provider Note MRN:  [id number]  Arrival date & time: 08/31/21    ED Course and Medical Decision Making  Assumed care from Dr. Julieanne Manson at shift change.  Diabetic ketoacidosis, type 1 diabetes, had some abdominal pain, CT abdomen negative for appendicitis, will need admission.  On insulin drip, fluids.  11:30 PM update: On my assessment patient is feeling much better, remains mildly tachycardic but in no acute distress.  Will still require admission for DKA.  .Critical Care  Performed by: Sabas Sous, MD Authorized by: Sabas Sous, MD   Critical care provider statement:    Critical care time (minutes):  35   Critical care was necessary to treat or prevent imminent or life-threatening deterioration of the following conditions:  Metabolic crisis   Critical care was time spent personally by me on the following activities:  Development of treatment plan with patient or surrogate, discussions with consultants, evaluation of patient's response to treatment, examination of patient, ordering and review of laboratory studies, ordering and review of radiographic studies, ordering and performing treatments and interventions, pulse oximetry, re-evaluation of patient's condition and review of old charts   Final Clinical Impressions(s) / ED Diagnoses     ICD-10-CM   1. Type 1 diabetes mellitus with ketoacidosis without coma (HCC)  E10.10       ED Discharge Orders     None       Discharge Instructions   None     Elmer Sow. Pilar Plate, MD [facility] Emergency Medicine [facility] [email or web address]    Sabas Sous, MD 08/31/21 [phone]

## 2021-08-31 NOTE — ED Notes (Signed)
Provider notified of IV success with inability to draw blood, phlebotomy to be notified for blood draw needs. This RN to put in second IV team consult

## 2021-08-31 NOTE — ED Notes (Signed)
IV TEAM AT BEDSIDE 

## 2021-08-31 NOTE — ED Notes (Signed)
Patient stuck multiple times without success in getting IV. Patient verbalizes that she has had Picc lines in the past. Airel RN asked to provided IV while waiting on the IV team

## 2021-08-31 NOTE — ED Notes (Signed)
CT notified of IV placement and size

## 2021-08-31 NOTE — ED Triage Notes (Addendum)
Reports needs her blood sugar checked.  Reports having abd pain back pain dizziness and had episode of incontinence.  Also wanting a pregnancy test. Patient also complains of nipple pain and headache.  Head 10/10 nipples 8/10, back 8/10 abd 8/10  Reports had vaginal bleeding for one day on 8/17

## 2021-08-31 NOTE — ED Notes (Signed)
Provider notified that the patient has 1 IV now and LR & insulin cannot run together so NS will be started.

## 2021-08-31 NOTE — ED Notes (Signed)
Sick DKA  labs   CBG 2300

## 2021-08-31 NOTE — ED Provider Notes (Signed)
[facility] DEPART[facility] Provider Note   CSN: [id number] Arrival date & time: 08/31/21  1715     History  Chief Complaint  Patient presents with   Blood Sugar Problem    Dawn [name] is a 21 y.o. female.  The history is provided by the patient and medical records. No language interpreter was used.  Hyperglycemia Blood sugar level PTA:  >600 Severity:  Severe Onset quality:  Gradual Duration:  1 day Timing:  Constant Progression:  Worsening Chronicity:  Recurrent Diabetes status:  Controlled with insulin Context: insulin pump use   Relieved by:  Nothing Ineffective treatments:  None tried Associated symptoms: abdominal pain, dehydration, fatigue, increased thirst and polyuria   Associated symptoms: no altered mental status, no chest pain, no confusion, no dysuria, no fever, no nausea, no shortness of breath, no vomiting and no weakness        Home Medications Prior to Admission medications   Medication Sig Start Date End Date Taking? Authorizing Provider  Accu-Chek Softclix Lancets lancets Please use to check blood sugar up to 6 times/day. 08/05/21   August Albino, MD  acetaminophen (TYLENOL) 500 MG tablet Take 1,000 mg by mouth every 6 (six) hours as needed for moderate pain or headache. Patient not taking: Reported on 08/05/2021    [provider]  albuterol (VENTOLIN HFA) 108 (90 Base) MCG/ACT inhaler Inhale 2 puffs into the lungs every 6 (six) hours as needed for wheezing or shortness of breath. Patient not taking: Reported on 08/05/2021 03/24/21   [name], MD  blood glucose meter kit and supplies Dispense based on patient and insurance preference. Use up to four times daily as directed. (FOR ICD-10 E10.9, E11.9). 08/05/21   August Albino, MD  Blood Glucose Monitoring Suppl (ACCU-CHEK GUIDE ME) w/Device KIT Use 3 (three) times daily before meals. 08/05/21   August Albino, MD  Continuous Blood Gluc Sensor (FREESTYLE LIBRE 2 SENSOR) MISC 1  Units by Does not apply route every 14 (fourteen) days. 08/05/21   August Albino, MD  Glucagon (BAQSIMI ONE PACK) 3 MG/DOSE POWD Place 3 mg into the nose once as needed (low blood sugar). Patient not taking: Reported on 08/05/2021    [provider]  glucose blood (ACCU-CHEK GUIDE) test strip Use to check glucose 6x daily 08/05/21   August Albino, MD  ibuprofen (ADVIL) 200 MG tablet Take 400 mg by mouth every 6 (six) hours as needed for headache or moderate pain. Patient not taking: Reported on 08/05/2021    [provider]  insulin glargine (LANTUS SOLOSTAR) 100 UNIT/ML Solostar Pen Inject 30 Units into the skin daily. 08/05/21   August Albino, MD  insulin lispro (HUMALOG KWIKPEN) 100 UNIT/ML KwikPen Inject 5-15 Units into the skin 3 times daily with meals- per sliding scale based on carb count. (See admin instructions.) 08/05/21 09/04/21  August Albino, MD  Insulin Pen Needle (BD PEN NEEDLE NANO 2ND GEN) 32G X 4 MM MISC USE TO INJECT INSULIN 6 TIMES DAILY 08/05/21   August Albino, MD  Prenatal Vit-Fe Fumarate-FA (PRENATAL PO) Take 1 tablet by mouth daily.    [provider]  sertraline (ZOLOFT) 25 MG tablet Take 25 mg by mouth daily.    [provider]  sertraline (ZOLOFT) 50 MG tablet Take 1.5 tablets (75 mg total) by mouth daily. 03/11/21   Dameron, Luna Fuse, DO  SUMAtriptan (IMITREX) 25 MG tablet Take 0.5 tablets (12.5 mg total) by mouth every 2 (two) hours as needed for migraine. May  repeat in 2 hours if headache persists or recurs. 04/12/21   [name], MD      Allergies    Latex, Tape, and Shellfish allergy    Review of Systems   Review of Systems  Constitutional:  Positive for fatigue. Negative for chills and fever.  HENT:  Negative for congestion.   Respiratory:  Negative for chest tightness, shortness of breath, wheezing and stridor.   Cardiovascular:  Negative for chest pain and palpitations.  Gastrointestinal:  Positive for abdominal pain. Negative for  constipation, diarrhea, nausea and vomiting.  Endocrine: Positive for polydipsia and polyuria.  Genitourinary:  Positive for frequency. Negative for dysuria and flank pain.  Musculoskeletal:  Negative for back pain, neck pain and neck stiffness.  Skin:  Negative for rash and wound.  Neurological:  Negative for weakness, light-headedness and headaches.  Psychiatric/Behavioral:  Negative for agitation and confusion.   All other systems reviewed and are negative.   Physical Exam Updated Vital Signs BP 122/83 (BP Location: Right Arm)   Pulse (!) 120   Temp 99.3 F (37.4 C) (Oral)   Resp (!) 22   Ht 5' 2"  (1.575 m)   Wt 49.9 kg   LMP 08/03/2021   SpO2 98%   BMI 20.12 kg/m  Physical Exam Vitals and nursing note reviewed.  Constitutional:      General: She is not in acute distress.    Appearance: She is well-developed. She is ill-appearing. She is not toxic-appearing or diaphoretic.  HENT:     Head: Normocephalic and atraumatic.     Nose: Nose normal.     Mouth/Throat:     Mouth: Mucous membranes are dry.     Pharynx: No oropharyngeal exudate or posterior oropharyngeal erythema.  Eyes:     Extraocular Movements: Extraocular movements intact.     Conjunctiva/sclera: Conjunctivae normal.     Pupils: Pupils are equal, round, and reactive to light.  Cardiovascular:     Rate and Rhythm: Normal rate and regular rhythm.     Heart sounds: No murmur heard. Pulmonary:     Effort: Pulmonary effort is normal. No respiratory distress.     Breath sounds: Normal breath sounds.  Abdominal:     General: Abdomen is flat.     Palpations: Abdomen is soft.     Tenderness: There is abdominal tenderness. There is no guarding or rebound.  Musculoskeletal:        General: No swelling.     Cervical back: Neck supple. No tenderness.     Right lower leg: No edema.     Left lower leg: No edema.  Skin:    General: Skin is warm and dry.     Capillary Refill: Capillary refill takes less than 2  seconds.     Findings: No erythema.  Neurological:     General: No focal deficit present.     Mental Status: She is alert.  Psychiatric:        Mood and Affect: Mood normal.     ED Results / Procedures / Treatments   Labs (all labs ordered are listed, but only abnormal results are displayed) Labs Reviewed  URINALYSIS, ROUTINE W REFLEX MICROSCOPIC - Abnormal; Notable for the following components:      Result Value   APPearance HAZY (*)    Glucose, UA >=500 (*)    Hgb urine dipstick LARGE (*)    Ketones, ur 80 (*)    Bacteria, UA RARE (*)    All other components within  normal limits  BETA-HYDROXYBUTYRIC ACID - Abnormal; Notable for the following components:   Beta-Hydroxybutyric Acid >8.00 (*)    All other components within normal limits  OSMOLALITY - Abnormal; Notable for the following components:   Osmolality 334 (*)    All other components within normal limits  COMPREHENSIVE METABOLIC PANEL - Abnormal; Notable for the following components:   Sodium 130 (*)    CO2 <7 (*)    Glucose, Bld 657 (*)    BUN 24 (*)    Creatinine, Ser 1.40 (*)    Total Protein 8.4 (*)    Alkaline Phosphatase 128 (*)    Total Bilirubin 2.0 (*)    GFR, Estimated 55 (*)    All other components within normal limits  CBG MONITORING, ED - Abnormal; Notable for the following components:   Glucose-Capillary 598 (*)    All other components within normal limits  I-STAT VENOUS BLOOD GAS, ED - Abnormal; Notable for the following components:   pH, Ven 7.153 (*)    pCO2, Ven 15.0 (*)    pO2, Ven 119 (*)    Bicarbonate 5.3 (*)    TCO2 6 (*)    Acid-base deficit 21.0 (*)    Sodium 130 (*)    All other components within normal limits  CBC  CBG MONITORING, ED  I-STAT BETA HCG BLOOD, ED ([facility], [facility], [facility] ONLY)  CBG MONITORING, ED  CBG MONITORING, ED    EKG None  Radiology CT ABDOMEN PELVIS W CONTRAST  Result Date: 08/31/2021 CLINICAL DATA:  Right lower quadrant abdominal pain. EXAM: CT ABDOMEN AND  PELVIS WITH CONTRAST TECHNIQUE: Multidetector CT imaging of the abdomen and pelvis was performed using the standard protocol following bolus administration of intravenous contrast. RADIATION DOSE REDUCTION: This exam was performed according to the departmental dose-optimization program which includes automated exposure control, adjustment of the mA and/or kV according to patient size and/or use of iterative reconstruction technique. CONTRAST:  46m OMNIPAQUE IOHEXOL 300 MG/ML  SOLN COMPARISON:  CT examination dated March 10, 2021 FINDINGS: Lower chest: No acute abnormality. Hepatobiliary: No focal liver abnormality is seen. No gallstones, gallbladder wall thickening, or biliary dilatation. Pancreas: Unremarkable. No pancreatic ductal dilatation or surrounding inflammatory changes. Spleen: Normal in size without focal abnormality. Adrenals/Urinary Tract: Adrenal glands are unremarkable. Symmetric perfusion of bilateral kidneys. No evidence of nephrolithiasis. Urinary bladder is markedly distended. There is fullness of the left ureter, which may be secondary to reflux/distended bladder. Stomach/Bowel: Stomach is within normal limits. Appendix appears normal. No evidence of bowel wall thickening, distention, or inflammatory changes. Vascular/Lymphatic: No significant vascular findings are present. No enlarged abdominal or pelvic lymph nodes. Reproductive: Uterus and bilateral adnexa are unremarkable. Other: No abdominal wall hernia or abnormality. No abdominopelvic ascites. Musculoskeletal: No acute or significant osseous findings. IMPRESSION: 1.  Normal appendix.  No evidence of colitis or diverticulitis. 2. Markedly distended urinary bladder with fullness of bilateral renal pelvises and prominence of the left ureter, which may be secondary to reflux/urinary bladder. If there is clinical concern for urinary tract infection, correlate with urinalysis. Follow-up renal sonogram after emptying the bladder could be  considered. 3.  Uterus and adnexa are unremarkable. Electronically Signed   By: IKeane PoliceD.O.   On: 08/31/2021 22:12    Procedures Procedures    CRITICAL CARE Performed by: CGwenyth AllegraTegeler Total critical care time: 45 minutes Critical care time was exclusive of separately billable procedures and treating other patients. Critical care was necessary to treat or prevent  imminent or life-threatening deterioration. Critical care was time spent personally by me on the following activities: development of treatment plan with patient and/or surrogate as well as nursing, discussions with consultants, evaluation of patient's response to treatment, examination of patient, obtaining history from patient or surrogate, ordering and performing treatments and interventions, ordering and review of laboratory studies, ordering and review of radiographic studies, pulse oximetry and re-evaluation of patient's condition.   Medications Ordered in ED Medications  lactated ringers bolus 998 mL (0 mLs Intravenous Hold 08/31/21 2141)  insulin regular, human (MYXREDLIN) 100 units/ 100 mL infusion (6 Units/hr Intravenous New Bag/Given 08/31/21 2206)  lactated ringers infusion (0 mLs Intravenous Hold 08/31/21 2144)  dextrose 5 % in lactated ringers infusion (0 mLs Intravenous Hold 08/31/21 2019)  dextrose 50 % solution 0-50 mL (has no administration in time range)  sodium chloride 0.9 % bolus 1,000 mL (1,000 mLs Intravenous New Bag/Given 08/31/21 2143)  fentaNYL (SUBLIMAZE) injection 50 mcg (50 mcg Intravenous Given 08/31/21 2127)  iohexol (OMNIPAQUE) 300 MG/ML solution 70 mL (70 mLs Intravenous Contrast Given 08/31/21 2203)    ED Course/ Medical Decision Making/ A&P                           Medical Decision Making Amount and/or Complexity of Data Reviewed Radiology: ordered.  Risk Prescription drug management. Decision regarding hospitalization.    Dawn [name] is a 21 y.o. female with a past medical  history significant for type 1 diabetes with recurrent DKA, previous pyelonephritis, migraines, and asthma who presents with abdominal pain, fatigue, dry mouth, urinary frequency, lightheadedness, and hyperglycemia.  According to patient, her insulin pump was acting up in someway and today she started having her symptoms of DKA again.  She reports no nausea or vomiting but has some lower abdominal pain primarily in the right lower quadrant, polyuria, frequency, and fatigue.  On arrival, patient was found to be tachycardic and tachypneic and was hyperglycemic greater than 500.  Patient was brought back to exam room where work-up was continued.  On my exam, lungs are clear and chest is nontender.  Abdomen was tender in the right lower quadrant primarily.  Bowel sounds were appreciated.  No focal neurologic deficits initially.  Dry mucous membranes.  Patient will be given fluids and get her suspected DKA work-up started.  Work-up returned and does show evidence of DKA.  Urinalysis shows no nitrites or leukocytes, doubt UTI.  CT scan showed no appendicitis and shows a distended bladder likely from all the fluids.    Patient started on the insulin drip and fluids and will be admitted for further management of recurrent DKA.  Patient has had improvement in symptoms after fluids and intervention began.         Final Clinical Impression(s) / ED Diagnoses Final diagnoses:  Type 1 diabetes mellitus with ketoacidosis without coma (HCC)      Clinical Impression: 1. Type 1 diabetes mellitus with ketoacidosis without coma ([place])     Disposition: Admit  This note was prepared with assistance of Dragon voice recognition software. Occasional wrong-word or sound-a-like substitutions may have occurred due to the inherent limitations of voice recognition software.      Tashae Inda, Gwenyth Allegra, MD 08/31/21 [phone]

## 2021-08-31 NOTE — ED Notes (Signed)
Notified provider in triage of critical glucose.

## 2021-08-31 NOTE — ED Notes (Signed)
Provider notified of CBG >600

## 2021-09-01 ENCOUNTER — Encounter: Payer: Self-pay | Admitting: Student

## 2021-09-01 ENCOUNTER — Observation Stay (HOSPITAL_COMMUNITY): Payer: Medicaid Other

## 2021-09-01 ENCOUNTER — Encounter (HOSPITAL_COMMUNITY): Payer: Self-pay | Admitting: Internal Medicine

## 2021-09-01 ENCOUNTER — Telehealth: Payer: Self-pay

## 2021-09-01 DIAGNOSIS — G43909 Migraine, unspecified, not intractable, without status migrainosus: Secondary | ICD-10-CM | POA: Diagnosis present

## 2021-09-01 DIAGNOSIS — N179 Acute kidney failure, unspecified: Secondary | ICD-10-CM | POA: Diagnosis present

## 2021-09-01 DIAGNOSIS — E1110 Type 2 diabetes mellitus with ketoacidosis without coma: Secondary | ICD-10-CM | POA: Diagnosis present

## 2021-09-01 DIAGNOSIS — F4321 Adjustment disorder with depressed mood: Secondary | ICD-10-CM | POA: Diagnosis present

## 2021-09-01 DIAGNOSIS — E1065 Type 1 diabetes mellitus with hyperglycemia: Secondary | ICD-10-CM | POA: Diagnosis not present

## 2021-09-01 DIAGNOSIS — Z659 Problem related to unspecified psychosocial circumstances: Secondary | ICD-10-CM | POA: Diagnosis not present

## 2021-09-01 DIAGNOSIS — F909 Attention-deficit hyperactivity disorder, unspecified type: Secondary | ICD-10-CM | POA: Diagnosis present

## 2021-09-01 DIAGNOSIS — Z8249 Family history of ischemic heart disease and other diseases of the circulatory system: Secondary | ICD-10-CM | POA: Diagnosis not present

## 2021-09-01 DIAGNOSIS — N3289 Other specified disorders of bladder: Secondary | ICD-10-CM | POA: Diagnosis present

## 2021-09-01 DIAGNOSIS — E1010 Type 1 diabetes mellitus with ketoacidosis without coma: Secondary | ICD-10-CM

## 2021-09-01 DIAGNOSIS — Z9641 Presence of insulin pump (external) (internal): Secondary | ICD-10-CM | POA: Diagnosis present

## 2021-09-01 DIAGNOSIS — F32A Depression, unspecified: Secondary | ICD-10-CM

## 2021-09-01 DIAGNOSIS — J45909 Unspecified asthma, uncomplicated: Secondary | ICD-10-CM | POA: Diagnosis present

## 2021-09-01 DIAGNOSIS — Z91013 Allergy to seafood: Secondary | ICD-10-CM | POA: Diagnosis not present

## 2021-09-01 DIAGNOSIS — Z825 Family history of asthma and other chronic lower respiratory diseases: Secondary | ICD-10-CM | POA: Diagnosis not present

## 2021-09-01 DIAGNOSIS — Z91040 Latex allergy status: Secondary | ICD-10-CM | POA: Diagnosis not present

## 2021-09-01 DIAGNOSIS — Z79899 Other long term (current) drug therapy: Secondary | ICD-10-CM | POA: Diagnosis not present

## 2021-09-01 DIAGNOSIS — Z833 Family history of diabetes mellitus: Secondary | ICD-10-CM | POA: Diagnosis not present

## 2021-09-01 DIAGNOSIS — F331 Major depressive disorder, recurrent, moderate: Secondary | ICD-10-CM | POA: Diagnosis present

## 2021-09-01 DIAGNOSIS — R1013 Epigastric pain: Secondary | ICD-10-CM | POA: Diagnosis not present

## 2021-09-01 DIAGNOSIS — Z794 Long term (current) use of insulin: Secondary | ICD-10-CM | POA: Diagnosis not present

## 2021-09-01 DIAGNOSIS — E860 Dehydration: Secondary | ICD-10-CM | POA: Diagnosis present

## 2021-09-01 DIAGNOSIS — Z91048 Other nonmedicinal substance allergy status: Secondary | ICD-10-CM | POA: Diagnosis not present

## 2021-09-01 DIAGNOSIS — R3589 Other polyuria: Secondary | ICD-10-CM | POA: Diagnosis present

## 2021-09-01 LAB — BASIC METABOLIC PANEL
Anion gap: 11 (ref 5–15)
BUN: 14 mg/dL (ref 6–20)
CO2: 13 mmol/L — ABNORMAL LOW (ref 22–32)
Calcium: 8.5 mg/dL — ABNORMAL LOW (ref 8.9–10.3)
Chloride: 111 mmol/L (ref 98–111)
Creatinine, Ser: 0.88 mg/dL (ref 0.44–1.00)
GFR, Estimated: >60 mL/min (ref >60)
Glucose, Bld: 188 mg/dL — ABNORMAL HIGH (ref 70–99)
Potassium: 3.8 mmol/L (ref 3.5–5.1)
Sodium: 135 mmol/L (ref 135–145)

## 2021-09-01 LAB — COMPREHENSIVE METABOLIC PANEL
ALT: 18 U/L (ref 0–44)
AST: 15 U/L (ref 15–41)
Albumin: 3.2 g/dL — ABNORMAL LOW (ref 3.5–5.0)
Alkaline Phosphatase: 119 U/L (ref 38–126)
Anion gap: 16 — ABNORMAL HIGH (ref 5–15)
BUN: 22 mg/dL — ABNORMAL HIGH (ref 6–20)
CO2: 9 mmol/L — ABNORMAL LOW (ref 22–32)
Calcium: 8.7 mg/dL — ABNORMAL LOW (ref 8.9–10.3)
Chloride: 112 mmol/L — ABNORMAL HIGH (ref 98–111)
Creatinine, Ser: 1.08 mg/dL — ABNORMAL HIGH (ref 0.44–1.00)
GFR, Estimated: >60 mL/min (ref >60)
Glucose, Bld: 197 mg/dL — ABNORMAL HIGH (ref 70–99)
Potassium: 4.4 mmol/L (ref 3.5–5.1)
Sodium: 137 mmol/L (ref 135–145)
Total Bilirubin: 1.6 mg/dL — ABNORMAL HIGH (ref 0.3–1.2)
Total Protein: 7.2 g/dL (ref 6.5–8.1)

## 2021-09-01 LAB — CBC WITH DIFFERENTIAL/PLATELET
Abs Immature Granulocytes: 0.07 10*3/uL (ref 0.00–0.07)
Basophils Absolute: 0.1 10*3/uL (ref 0.0–0.1)
Basophils Relative: 1 %
Eosinophils Absolute: 0.1 10*3/uL (ref 0.0–0.5)
Eosinophils Relative: 2 %
HCT: 40.5 % (ref 36.0–46.0)
Hemoglobin: 12.4 g/dL (ref 12.0–15.0)
Immature Granulocytes: 1 %
Lymphocytes Relative: 51 %
Lymphs Abs: 4.7 10*3/uL — ABNORMAL HIGH (ref 0.7–4.0)
MCH: 28.8 pg (ref 26.0–34.0)
MCHC: 30.6 g/dL (ref 30.0–36.0)
MCV: 94 fL (ref 80.0–100.0)
Monocytes Absolute: 0.8 10*3/uL (ref 0.1–1.0)
Monocytes Relative: 9 %
Neutro Abs: 3.2 10*3/uL (ref 1.7–7.7)
Neutrophils Relative %: 36 %
Platelets: 307 10*3/uL (ref 150–400)
RBC: 4.31 MIL/uL (ref 3.87–5.11)
RDW: 14.6 % (ref 11.5–15.5)
WBC: 9 10*3/uL (ref 4.0–10.5)
nRBC: 0 % (ref 0.0–0.2)

## 2021-09-01 LAB — CBG MONITORING, ED
Glucose-Capillary: 150 mg/dL — ABNORMAL HIGH (ref 70–99)
Glucose-Capillary: 156 mg/dL — ABNORMAL HIGH (ref 70–99)
Glucose-Capillary: 164 mg/dL — ABNORMAL HIGH (ref 70–99)
Glucose-Capillary: 174 mg/dL — ABNORMAL HIGH (ref 70–99)
Glucose-Capillary: 199 mg/dL — ABNORMAL HIGH (ref 70–99)
Glucose-Capillary: 213 mg/dL — ABNORMAL HIGH (ref 70–99)
Glucose-Capillary: 216 mg/dL — ABNORMAL HIGH (ref 70–99)
Glucose-Capillary: 228 mg/dL — ABNORMAL HIGH (ref 70–99)
Glucose-Capillary: 235 mg/dL — ABNORMAL HIGH (ref 70–99)
Glucose-Capillary: 307 mg/dL — ABNORMAL HIGH (ref 70–99)
Glucose-Capillary: 408 mg/dL — ABNORMAL HIGH (ref 70–99)
Glucose-Capillary: 492 mg/dL — ABNORMAL HIGH (ref 70–99)
Glucose-Capillary: >600 mg/dL (ref 70–99)
Glucose-Capillary: >600 mg/dL (ref 70–99)

## 2021-09-01 LAB — LIPASE, BLOOD: Lipase: 28 U/L (ref 11–51)

## 2021-09-01 LAB — MAGNESIUM: Magnesium: 2.2 mg/dL (ref 1.7–2.4)

## 2021-09-01 LAB — GLUCOSE, CAPILLARY: Glucose-Capillary: 259 mg/dL — ABNORMAL HIGH (ref 70–99)

## 2021-09-01 LAB — CREATININE, URINE, RANDOM: Creatinine, Urine: 18 mg/dL

## 2021-09-01 LAB — SODIUM, URINE, RANDOM: Sodium, Ur: 63 mmol/L

## 2021-09-01 LAB — PHOSPHORUS: Phosphorus: 3.4 mg/dL (ref 2.5–4.6)

## 2021-09-01 MED ORDER — FENTANYL CITRATE PF 50 MCG/ML IJ SOSY: 50.0000 ug | PREFILLED_SYRINGE | Freq: Once | INTRAMUSCULAR | Status: DC

## 2021-09-01 MED ORDER — NALOXONE HCL 0.4 MG/ML IJ SOLN: 0.4000 mg | INTRAMUSCULAR | Status: DC | PRN

## 2021-09-01 MED ORDER — SERTRALINE HCL 50 MG PO TABS
75.0000 mg | ORAL_TABLET | Freq: Every day | ORAL | Status: DC
  Administered 2021-09-01 – 2021-09-02 (×2): 75 mg via ORAL
  Filled 2021-09-01: qty 1
  Filled 2021-09-01: qty 2

## 2021-09-01 MED ORDER — ACETAMINOPHEN 325 MG PO TABS: 650.0000 mg | ORAL_TABLET | Freq: Four times a day (QID) | ORAL | Status: DC | PRN

## 2021-09-01 MED ORDER — POTASSIUM CHLORIDE CRYS ER 20 MEQ PO TBCR
20.0000 meq | EXTENDED_RELEASE_TABLET | Freq: Once | ORAL | Status: AC
  Administered 2021-09-01: 20 meq via ORAL
  Filled 2021-09-01: qty 1

## 2021-09-01 MED ORDER — KCL IN DEXTROSE-NACL 10-5-0.45 MEQ/L-%-% IV SOLN
INTRAVENOUS | Status: DC
  Filled 2021-09-01 (×2): qty 1000

## 2021-09-01 MED ORDER — ONDANSETRON HCL 4 MG/2ML IJ SOLN: 4.0000 mg | Freq: Four times a day (QID) | INTRAMUSCULAR | Status: DC | PRN

## 2021-09-01 MED ORDER — FENTANYL CITRATE PF 50 MCG/ML IJ SOSY
25.0000 ug | PREFILLED_SYRINGE | INTRAMUSCULAR | Status: DC | PRN
  Administered 2021-09-01: 25 ug via INTRAVENOUS
  Filled 2021-09-01: qty 1

## 2021-09-01 MED ORDER — INSULIN GLARGINE-YFGN 100 UNIT/ML ~~LOC~~ SOLN
25.0000 [IU] | Freq: Every day | SUBCUTANEOUS | Status: DC
  Administered 2021-09-01 – 2021-09-02 (×2): 25 [IU] via SUBCUTANEOUS
  Filled 2021-09-01 (×2): qty 0.25

## 2021-09-01 MED ORDER — LACTATED RINGERS IV SOLN: INTRAVENOUS | Status: AC

## 2021-09-01 MED ORDER — INSULIN ASPART 100 UNIT/ML IJ SOLN
0.0000 [IU] | Freq: Three times a day (TID) | INTRAMUSCULAR | Status: DC
  Administered 2021-09-01: 5 [IU] via SUBCUTANEOUS
  Administered 2021-09-01: 20 [IU] via SUBCUTANEOUS
  Administered 2021-09-02: 15 [IU] via SUBCUTANEOUS
  Administered 2021-09-02: 3 [IU] via SUBCUTANEOUS

## 2021-09-01 MED ORDER — INSULIN ASPART 100 UNIT/ML IJ SOLN
0.0000 [IU] | Freq: Every day | INTRAMUSCULAR | Status: DC
  Administered 2021-09-01: 3 [IU] via SUBCUTANEOUS

## 2021-09-01 MED ORDER — ACETAMINOPHEN 650 MG RE SUPP: 650.0000 mg | Freq: Four times a day (QID) | RECTAL | Status: DC | PRN

## 2021-09-01 MED ORDER — PANTOPRAZOLE SODIUM 40 MG IV SOLR
40.0000 mg | Freq: Every day | INTRAVENOUS | Status: DC
  Administered 2021-09-01 – 2021-09-02 (×2): 40 mg via INTRAVENOUS
  Filled 2021-09-01 (×2): qty 10

## 2021-09-01 MED ORDER — SODIUM CHLORIDE 0.9 % IV BOLUS
1000.0000 mL | Freq: Once | INTRAVENOUS | Status: AC
  Administered 2021-09-01: 1000 mL via INTRAVENOUS

## 2021-09-01 MED ORDER — INSULIN GLARGINE-YFGN 100 UNIT/ML ~~LOC~~ SOLN
25.0000 [IU] | Freq: Every day | SUBCUTANEOUS | Status: DC
  Filled 2021-09-01: qty 0.25

## 2021-09-01 MED ORDER — ENOXAPARIN SODIUM 40 MG/0.4ML IJ SOSY: 40.0000 mg | PREFILLED_SYRINGE | INTRAMUSCULAR | Status: DC

## 2021-09-01 MED ORDER — SODIUM CHLORIDE 0.45 % IV SOLN
INTRAVENOUS | Status: DC
  Filled 2021-09-01 (×2): qty 1000

## 2021-09-01 NOTE — ED Notes (Signed)
Dr Arlean Hopping notified of the patient's lac of IV access and inability to run LR with insulin, verbalizes awareness
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
## 2021-09-01 NOTE — ED Notes (Signed)
This RN spoke with Tammy Sours from pharmacy who verbally reports that it is safe to start ordered drip with IV insulin

## 2021-09-01 NOTE — ED Notes (Signed)
Pt in room resting. NAD chest rising and falling. Pt A&O x4. Hard to arouse.. Pt updated on plan of care for admission.

## 2021-09-01 NOTE — ED Notes (Signed)
CBG 156 

## 2021-09-01 NOTE — Progress Notes (Signed)
PROGRESS NOTE                                                                                                                                                                                                             Patient Demographics:    Dawn [name], is a 21 y.o. female, DOB - 05-19-2000, FTD:[id number]  Outpatient Primary MD for the patient is Darral Dash, DO    LOS - 0  Admit date - 08/31/2021    Chief Complaint  Patient presents with   Blood Sugar Problem       Brief Narrative (HPI from H&P)    21 y.o. female with medical history significant for poorly controlled type 1 diabetes mellitus complicated by recurrent hospitalizations for DKA, depression, who is admitted to [facility] on 08/31/2021 with DKA after presenting to [facility] ED complaining of elevated blood sugars.    Subjective:    [name] today has, No headache, No chest pain, No abdominal pain - No Nausea, No new weakness tingling or numbness, no SOB.   Assessment  & Plan :   DKA in a patient with DM type I.  Multiple admissions in the last 6 months, question compliance.  Patient states that she is compliant with her insulin regimen however A1c suggest otherwise.  She is being treated with DKA protocol, her gap is closed, she she will receive Lantus will titrate up off of insulin drip and transition to sliding scale.  DM and insulin education.  Counseled on compliance.   Nonspecific bladder distention and possible hydronephrosis noted on CT abdomen pelvis.  Stable renal ultrasound.  Continue to monitor clinically.  Dehydration with AKI.  Continue hydration with IV fluids.  AKI improved.  HX of depression.  On Zoloft continue.  Flat affect.  Not suicidal or homicidal.       Condition - Fair  Family Communication  :  None present  Code Status :  Full  Consults  :    PUD Prophylaxis : PPI   Procedures  :           Disposition  Plan  :    Status is: Observation  DVT Prophylaxis  :  Lovenox  SCDs Start: 09/01/21 0111    Lab Results  Component Value Date   PLT 307 09/01/2021    Diet :  Diet Order             Diet Carb Modified Fluid consistency: Thin; Room service appropriate? Yes  Diet effective now                    Inpatient Medications  Scheduled Meds:  insulin aspart  0-15 Units Subcutaneous TID WC   insulin aspart  0-5 Units Subcutaneous QHS   insulin glargine-yfgn  25 Units Subcutaneous Daily   pantoprazole (PROTONIX) IV  40 mg Intravenous Daily   potassium chloride  20 mEq Oral Once   sertraline  75 mg Oral Daily   Continuous Infusions:  lactated ringers     PRN Meds:.acetaminophen **OR** acetaminophen, dextrose, naLOXone (NARCAN)  injection, ondansetron (ZOFRAN) IV  Antibiotics  :    Anti-infectives (From admission, onward)    None        Time Spent in minutes  30   Lala Lund M.D on 09/01/2021 at 11:12 AM  To page go to [email or web address]   [facility] -  Office  [phone]  See all Orders from today for further details    Objective:   Vitals:   09/01/21 0938 09/01/21 1000 09/01/21 1030 09/01/21 1100  BP:  95/65 95/61 91/63   Pulse:  91 89 84  Resp:  13 15 13   Temp: 98.6 F (37 C)     TempSrc:      SpO2:  100% 100% 100%  Weight:      Height:        Wt Readings from Last 3 Encounters:  08/31/21 49.9 kg  08/09/21 50 kg  08/04/21 50 kg    No intake or output data in the 24 hours ending 09/01/21 1112   Physical Exam  Awake Alert, No new F.N deficits, flat affect [name],PERRAL Supple Neck, No JVD,   Symmetrical Chest wall movement, Good air movement bilaterally, CTAB RRR,No Gallops,Rubs or new Murmurs,  +ve B.Sounds, Abd Soft, No tenderness,   No Cyanosis, Clubbing or edema       Data Review:    CBC Recent Labs  Lab 08/31/21 1809 08/31/21 1824 09/01/21 0355  WBC 8.5  --  9.0  HGB 13.3 15.0 12.4  HCT 43.1 44.0 40.5  PLT 377   --  307  MCV 92.3  --  94.0  MCH 28.5  --  28.8  MCHC 30.9  --  30.6  RDW 14.5  --  14.6  LYMPHSABS  --   --  4.7*  MONOABS  --   --  0.8  EOSABS  --   --  0.1  BASOSABS  --   --  0.1    Electrolytes Recent Labs  Lab 08/31/21 1809 08/31/21 1824 09/01/21 0355 09/01/21 0930  NA 130* 130* 137 135  K 5.0 5.1 4.4 3.8  CL 99  --  112* 111  CO2 <7*  --  9* 13*  GLUCOSE 657*  --  197* 188*  BUN 24*  --  22* 14  CREATININE 1.40*  --  1.08* 0.88  CALCIUM 9.7  --  8.7* 8.5*  AST 23  --  15  --   ALT 21  --  18  --   ALKPHOS 128*  --  119  --   BILITOT 2.0*  --  1.6*  --   ALBUMIN 3.6  --  3.2*  --   MG  --   --  2.2  --     ------------------------------------------------------------------------------------------------------------------ No results for input(s): "  CHOL", "HDL", "LDLCALC", "TRIG", "CHOLHDL", "LDLDIRECT" in the last 72 hours.  Lab Results  Component Value Date   HGBA1C >15.5 (H) 08/05/2021    No results for input(s): "TSH", "T4TOTAL", "T3FREE", "THYROIDAB" in the last 72 hours.  Invalid input(s): "FREET3" ------------------------------------------------------------------------------------------------------------------ ID Labs Recent Labs  Lab 08/31/21 1809 09/01/21 0355 09/01/21 0930  WBC 8.5 9.0  --   PLT 377 307  --   CREATININE 1.40* 1.08* 0.88   Cardiac Enzymes No results for input(s): "CKMB", "TROPONINI", "MYOGLOBIN" in the last 168 hours.  Invalid input(s): "CK"       Micro Results No results found for this or any previous visit (from the past 240 hour(s)).  Radiology Reports US RENAL  Result Date: 09/01/2021 CLINICAL DATA:  Hydronephrosis. EXAM: RENAL / URINARY TRACT ULTRASOUND COMPLETE COMPARISON:  03/10/2021 FINDINGS: Right Kidney: Renal measurements: 10.5 x 5.0 x 6.4 cm = volume: 175 mL. Echogenicity appears mildly increased. No mass lesion or hydronephrosis. Left Kidney: Renal measurements: 11.1 x 5.1 x 4.1 cm = volume: 122 mL.  Echogenicity appears mildly increased. No mass lesion. No hydronephrosis. Bladder: Appears normal for degree of bladder distention. Other: None. IMPRESSION: No evidence for hydronephrosis. Electronically Signed   By: Kennith Center M.D.   On: 09/01/2021 09:22   CT ABDOMEN PELVIS W CONTRAST  Result Date: 08/31/2021 CLINICAL DATA:  Right lower quadrant abdominal pain. EXAM: CT ABDOMEN AND PELVIS WITH CONTRAST TECHNIQUE: Multidetector CT imaging of the abdomen and pelvis was performed using the standard protocol following bolus administration of intravenous contrast. RADIATION DOSE REDUCTION: This exam was performed according to the departmental dose-optimization program which includes automated exposure control, adjustment of the mA and/or kV according to patient size and/or use of iterative reconstruction technique. CONTRAST:  73mL OMNIPAQUE IOHEXOL 300 MG/ML  SOLN COMPARISON:  CT examination dated March 10, 2021 FINDINGS: Lower chest: No acute abnormality. Hepatobiliary: No focal liver abnormality is seen. No gallstones, gallbladder wall thickening, or biliary dilatation. Pancreas: Unremarkable. No pancreatic ductal dilatation or surrounding inflammatory changes. Spleen: Normal in size without focal abnormality. Adrenals/Urinary Tract: Adrenal glands are unremarkable. Symmetric perfusion of bilateral kidneys. No evidence of nephrolithiasis. Urinary bladder is markedly distended. There is fullness of the left ureter, which may be secondary to reflux/distended bladder. Stomach/Bowel: Stomach is within normal limits. Appendix appears normal. No evidence of bowel wall thickening, distention, or inflammatory changes. Vascular/Lymphatic: No significant vascular findings are present. No enlarged abdominal or pelvic lymph nodes. Reproductive: Uterus and bilateral adnexa are unremarkable. Other: No abdominal wall hernia or abnormality. No abdominopelvic ascites. Musculoskeletal: No acute or significant osseous findings.  IMPRESSION: 1.  Normal appendix.  No evidence of colitis or diverticulitis. 2. Markedly distended urinary bladder with fullness of bilateral renal pelvises and prominence of the left ureter, which may be secondary to reflux/urinary bladder. If there is clinical concern for urinary tract infection, correlate with urinalysis. Follow-up renal sonogram after emptying the bladder could be considered. 3.  Uterus and adnexa are unremarkable. Electronically Signed   By: Larose Hires D.O.   On: 08/31/2021 22:12

## 2021-09-01 NOTE — Inpatient Diabetes Management (Signed)
Inpatient Diabetes Program Recommendations  AACE/ADA: New Consensus Statement on Inpatient Glycemic Control (2015)  Target Ranges:  Prepandial:   less than 140 mg/dL      Peak postprandial:   less than 180 mg/dL (1-2 hours)      Critically ill patients:  140 - 180 mg/dL   Lab Results  Component Value Date   GLUCAP 213 (H) 09/01/2021   HGBA1C >15.5 (H) 08/05/2021    Review of Glycemic Control  Diabetes history: DM1 (requires basal, meal coverage and correction) Outpatient Diabetes medications: Lantus 30 units qd, Humalog 5-15 units tid meal coverage based on carb count Current orders for Inpatient glycemic control: Semglee 25 units  Inpatient Diabetes Program Recommendations:   -Continue IV insulin until 2 hrs. After Semglee given -Add Novolog 0-9 units q 4 hrs. While NPO, then -Add Novolog 4 units tid meal coverage when eating  Patient well known to DM coordinators. DM coordinator spoke with patient 08/04/21 and 08/05/21. Patient has had 6 inpatient admissions over the past 6 months.  Thank you, Billy Fischer. Siona Coulston, RN, MSN, CDE  Diabetes Coordinator Inpatient Glycemic Control Team Team Pager [phone] (8am-5pm) 09/01/2021 9:52 AM

## 2021-09-01 NOTE — Telephone Encounter (Signed)
Patient calls nurse line requesting to speak with Dr. Melissa Noon regarding current hospitalization. She is currently being held in the ED, waiting for a bed.   Attempted to gather more information, however, patient states that she just needs to speak with her doctor.   Requesting returned phone call to [phone].  Veronda Prude, RN

## 2021-09-01 NOTE — ED Notes (Signed)
CBG 199 

## 2021-09-01 NOTE — ED Notes (Signed)
Pt ambulated to restroom with a steady gait

## 2021-09-02 DIAGNOSIS — E1065 Type 1 diabetes mellitus with hyperglycemia: Secondary | ICD-10-CM | POA: Diagnosis not present

## 2021-09-02 DIAGNOSIS — Z659 Problem related to unspecified psychosocial circumstances: Secondary | ICD-10-CM

## 2021-09-02 DIAGNOSIS — F331 Major depressive disorder, recurrent, moderate: Secondary | ICD-10-CM | POA: Diagnosis not present

## 2021-09-02 DIAGNOSIS — E1010 Type 1 diabetes mellitus with ketoacidosis without coma: Secondary | ICD-10-CM | POA: Diagnosis not present

## 2021-09-02 LAB — CBC WITH DIFFERENTIAL/PLATELET
Abs Immature Granulocytes: 0.01 10*3/uL (ref 0.00–0.07)
Basophils Absolute: 0.1 10*3/uL (ref 0.0–0.1)
Basophils Relative: 1 %
Eosinophils Absolute: 0.2 10*3/uL (ref 0.0–0.5)
Eosinophils Relative: 3 %
HCT: 33.7 % — ABNORMAL LOW (ref 36.0–46.0)
Hemoglobin: 11.1 g/dL — ABNORMAL LOW (ref 12.0–15.0)
Immature Granulocytes: 0 %
Lymphocytes Relative: 60 %
Lymphs Abs: 3.6 10*3/uL (ref 0.7–4.0)
MCH: 28.5 pg (ref 26.0–34.0)
MCHC: 32.9 g/dL (ref 30.0–36.0)
MCV: 86.4 fL (ref 80.0–100.0)
Monocytes Absolute: 0.6 10*3/uL (ref 0.1–1.0)
Monocytes Relative: 9 %
Neutro Abs: 1.6 10*3/uL — ABNORMAL LOW (ref 1.7–7.7)
Neutrophils Relative %: 27 %
Platelets: 269 10*3/uL (ref 150–400)
RBC: 3.9 MIL/uL (ref 3.87–5.11)
RDW: 14.6 % (ref 11.5–15.5)
WBC: 6 10*3/uL (ref 4.0–10.5)
nRBC: 0 % (ref 0.0–0.2)

## 2021-09-02 LAB — COMPREHENSIVE METABOLIC PANEL
ALT: 15 U/L (ref 0–44)
AST: 19 U/L (ref 15–41)
Albumin: 2.7 g/dL — ABNORMAL LOW (ref 3.5–5.0)
Alkaline Phosphatase: 96 U/L (ref 38–126)
Anion gap: 7 (ref 5–15)
BUN: 8 mg/dL (ref 6–20)
CO2: 22 mmol/L (ref 22–32)
Calcium: 8.3 mg/dL — ABNORMAL LOW (ref 8.9–10.3)
Chloride: 108 mmol/L (ref 98–111)
Creatinine, Ser: 0.55 mg/dL (ref 0.44–1.00)
GFR, Estimated: >60 mL/min (ref >60)
Glucose, Bld: 178 mg/dL — ABNORMAL HIGH (ref 70–99)
Potassium: 3.1 mmol/L — ABNORMAL LOW (ref 3.5–5.1)
Sodium: 137 mmol/L (ref 135–145)
Total Bilirubin: 0.6 mg/dL (ref 0.3–1.2)
Total Protein: 5.9 g/dL — ABNORMAL LOW (ref 6.5–8.1)

## 2021-09-02 LAB — GLUCOSE, CAPILLARY
Glucose-Capillary: 131 mg/dL — ABNORMAL HIGH (ref 70–99)
Glucose-Capillary: 205 mg/dL — ABNORMAL HIGH (ref 70–99)
Glucose-Capillary: 362 mg/dL — ABNORMAL HIGH (ref 70–99)
Glucose-Capillary: 171 mg/dL — ABNORMAL HIGH (ref 70–99)

## 2021-09-02 LAB — HEMOGLOBIN A1C
Hgb A1c MFr Bld: >15.5 % — ABNORMAL HIGH (ref 4.8–5.6)
Mean Plasma Glucose: >398 mg/dL

## 2021-09-02 LAB — MAGNESIUM: Magnesium: 1.7 mg/dL (ref 1.7–2.4)

## 2021-09-02 MED ORDER — INSULIN GLARGINE-YFGN 100 UNIT/ML ~~LOC~~ SOLN
5.0000 [IU] | Freq: Once | SUBCUTANEOUS | Status: DC
Start: 2021-09-02 — End: 2021-09-02
  Filled 2021-09-02: qty 0.05

## 2021-09-02 MED ORDER — DEXCOM G6 SENSOR MISC: 3 refills | Status: DC

## 2021-09-02 MED ORDER — POTASSIUM CHLORIDE CRYS ER 20 MEQ PO TBCR: 20.0000 meq | EXTENDED_RELEASE_TABLET | Freq: Once | ORAL | Status: DC

## 2021-09-02 MED ORDER — MAGNESIUM SULFATE 2 GM/50ML IV SOLN
2.0000 g | Freq: Once | INTRAVENOUS | Status: AC
Start: 2021-09-02 — End: 2021-09-02
  Administered 2021-09-02: 2 g via INTRAVENOUS
  Filled 2021-09-02: qty 50

## 2021-09-02 MED ORDER — POTASSIUM CHLORIDE CRYS ER 20 MEQ PO TBCR
40.0000 meq | EXTENDED_RELEASE_TABLET | Freq: Once | ORAL | Status: AC
  Administered 2021-09-02: 40 meq via ORAL
  Filled 2021-09-02: qty 2

## 2021-09-02 MED ORDER — ACETAMINOPHEN 325 MG PO TABS: 650.0000 mg | ORAL_TABLET | Freq: Four times a day (QID) | ORAL | 1 refills | Status: DC | PRN

## 2021-09-02 MED ORDER — SERTRALINE HCL 25 MG PO TABS: 75.0000 mg | ORAL_TABLET | Freq: Every day | ORAL | 1 refills | Status: DC

## 2021-09-02 MED ORDER — IBUPROFEN 600 MG PO TABS
600.0000 mg | ORAL_TABLET | Freq: Once | ORAL | Status: AC
Start: 2021-09-02 — End: 2021-09-02
  Administered 2021-09-02: 600 mg via ORAL
  Filled 2021-09-02: qty 1

## 2021-09-02 NOTE — Inpatient Diabetes Management (Signed)
Inpatient Diabetes Program Recommendations  AACE/ADA: New Consensus Statement on Inpatient Glycemic Control (2015)  Target Ranges:  Prepandial:   less than 140 mg/dL      Peak postprandial:   less than 180 mg/dL (1-2 hours)      Critically ill patients:  140 - 180 mg/dL   Lab Results  Component Value Date   GLUCAP 362 (H) 09/02/2021   HGBA1C >15.5 (H) 08/05/2021    Review of Glycemic Control  Diabetes history: DM1 (requires basal, meal coverage and correction) Outpatient Diabetes medications: Lantus 30 units qd, Humalog 5-15 units tid meal coverage based on carb count Current orders for Inpatient glycemic control:  Semglee 25 units Novolog 0-15 units tid + hs  Inpatient Diabetes Program Recommendations:   -Increase Semglee to 30 units -Reduce Novolog 0-9 units tid + hs -Add Novolog 4 units tid meal coverage when eating  Patient well known to DM coordinators. DM coordinator spoke with patient 08/04/21 and 08/05/21. Patient has had 6 inpatient admissions over the past 6 months.  Thanks, Christena Deem RN, MSN, BC-ADM Inpatient Diabetes Coordinator Team Pager [phone] (8a-5p)

## 2021-09-02 NOTE — Assessment & Plan Note (Addendum)
Presenting serum creatinine 1.40 compared to most recent prior value of 0.85 on 08/09/2021.  Appears prerenal in nature in the context of dehydration resulting from presenting DKA.  Urinalysis with microscopy, as further detailed above.  No evidence of postrenal obstructive source identified on this evening CT abdomen/pelvis.  Plan: monitor strict I's & O's and daily weights. Attempt to avoid nephrotoxic agents. Refrain from NSAIDs. Repeat CMP in the morning. Check serum magnesium level. Add-on random urine sodium and random urine creatinine.  Further evaluation and management of presenting DKA, including plan regarding IV fluids as above

## 2021-09-02 NOTE — Discharge Instructions (Addendum)
Dear Dawn [name],  Thank you for letting us participate in your care. You were hospitalized for diabetic ketoacidosis (DKA) which can occur when you miss insulin doses. We have treated your DKA and have started you back on your insulin regimen. It is very important that you do not miss insulin doses and that you check your sugars regularly to keep you from going back into DKA.   POST-HOSPITAL & CARE INSTRUCTIONS We have sent refills of your Dexcom sensor to your pharmacy.  Please go to the clinic for follow up on 9/13 at 1030AM.  Your A1c was very elevated, you should follow up with [place] in the clinic regularly so that we can help manage your diabetes.  Go to your follow up appointments (listed below)   DOCTOR'S APPOINTMENT   Future Appointments  Date Time Provider Department Center  09/03/2021  2:00 PM [facility] SOCIAL WORKER 2 [facility] None     Take care and be well!  Family Medicine Teaching Service Inpatient Team [place]  [facility]  [address] [place], [place] [address] [phone]

## 2021-09-02 NOTE — Hospital Course (Addendum)
Dawn [name] is a 21 y.o. female with medical history significant for poorly controlled type 1 diabetes mellitus complicated by recurrent hospitalizations for DKA, depression, admitted for DKA.  Diabetic ketoacidosis without coma associated with type 1 diabetes mellitus This admission presented with glucose 590 and metabolic acidosis (serum bicarbonate < 7, anion gap 24), elevated hydroxybutyric acid in the setting of known history of poorly controlled type 1 diabetes with most recent A1c of greater than 15.5% with prior hospitalizations for DKA, suspect contribution to presenting DKA from suboptimal compliance with outpatient insulin regimen. Poor social situation also contributing- reports housing insecurity. At this time anion gap has been closed, and sugars in control (last glucose 171). Diabetes educator was consulted prior to discharge. Sent out with glucose monitoring supplies including dexcom sensor, insulin for DM I.  Epigastric pain Presented with 1 to 2 days of epigastric discomfort, in the absence of any acute peritoneal signs on physical exam.  Similar to the epigastric discomfort that the patient is experienced at times of previous episodes of DKA.  CT abdomen/pelvis showed no evidence of acute process. Potential contributions include element of diabetic gastroparesis given poorly controlled diabetes with hemoglobin A1c greater than 15.5%.  Patient conveys that her abdominal discomfort typically improves with treatment of DKA. Lipase 28. Pregnancy test not performed as patient is on her period. On discharge, pain localized to bilateral lower quadrants. Patient sent out on acetaminophen PRN for pain  MDD (major depressive disorder), recurrent episode, moderate Chronic, stable. On sertraline as an outpatient. Prescribed home dose of sertraline 75 mg daily. Refilled at time of discharge.   Asthma Chronic, stable. On albuterol PRN as an outpatient. Prescribed home dose of albuterol inhaler PRN  for wheezing.  Migraine Chronic, stable. On sumatriptan PRN as an outpatient. Prescribed home dose of sumatriptan PRN for migraine abortive treatment.  Issues for PCP follow-up: Adherence to insulin regimen for glucose control Discuss contraceptive options

## 2021-09-02 NOTE — Progress Notes (Signed)
   09/02/21 1158  Provider Notification  Provider Name/Title Zetta Bills  Date Provider Notified 09/02/21  Time Provider Notified 1213  Method of Notification Page  Notification Reason Red med refusal (also pt request for ibuprofen d/t mentrual cramps)

## 2021-09-02 NOTE — Discharge Summary (Addendum)
[facility] Discharge Summary  Patient name: Dawn [name] Medical record number: [id number] Date of birth: 06/18/2000 Age: 21 y.o. Gender: female Date of Admission: 08/31/2021  Date of Discharge: 09/02/2021  Admitting Physician: Rhetta Mura, DO  Primary Care Provider: Orvis Brill, DO Consultants: none  Indication for Hospitalization: diabetic ketoacidosis (DKA)  Brief Hospital Course:  Dawn [name] is a 21 y.o. female with medical history significant for poorly controlled type 1 diabetes mellitus complicated by recurrent hospitalizations for DKA, depression, admitted for DKA.  Diabetic ketoacidosis without coma associated with type 1 diabetes mellitus This admission presented with glucose 681 and metabolic acidosis (serum bicarbonate < 7, anion gap 24), elevated hydroxybutyric acid in the setting of known history of poorly controlled type 1 diabetes with most recent A1c of greater than 15.5% with prior hospitalizations for DKA, suspect contribution to presenting DKA from suboptimal compliance with outpatient insulin regimen. Poor social situation also contributing- reports housing insecurity. At this time anion gap has been closed, and sugars in control (last glucose 171). Diabetes educator was consulted prior to discharge. Sent out with glucose monitoring supplies including dexcom sensor, insulin for DM I.  Epigastric pain Presented with 1 to 2 days of epigastric discomfort, in the absence of any acute peritoneal signs on physical exam.  Similar to the epigastric discomfort that the patient is experienced at times of previous episodes of DKA.  CT abdomen/pelvis showed no evidence of acute process. Potential contributions include element of diabetic gastroparesis given poorly controlled diabetes with hemoglobin A1c greater than 15.5%.  Patient conveys that her abdominal discomfort typically improves with treatment of DKA. Lipase 28. Pregnancy test not  performed as patient is on her period. On discharge, pain localized to bilateral lower quadrants. Patient sent out on acetaminophen PRN for pain  MDD (major depressive disorder), recurrent episode, moderate Chronic, stable. On sertraline as an outpatient. Prescribed home dose of sertraline 75 mg daily. Refilled at time of discharge.   Asthma Chronic, stable. On albuterol PRN as an outpatient. Prescribed home dose of albuterol inhaler PRN for wheezing.  Migraine Chronic, stable. On sumatriptan PRN as an outpatient. Prescribed home dose of sumatriptan PRN for migraine abortive treatment.  Issues for PCP follow-up: Adherence to insulin regimen for glucose control Discuss contraceptive options  Discharge Diagnoses/Problem List:  Diabetic ketoacidosis without coma associated with type 1 diabetes mellitus Epigastric pain MDD (major depressive disorder), recurrent episode, moderate  Disposition: home to godmother  Discharge Condition: stable  Discharge Exam: BP 103/87 (BP Location: Left Arm)   Pulse 82   Temp 98.3 F (36.8 C) (Oral)   Resp 13   Ht _0  (1.575 m)   Wt 49.9 kg   LMP 08/03/2021   SpO2 100%   BMI 20.12 kg/m  Physical Exam: General: well-appearing and in no acute distress HEENT: normocephalic and atraumatic Cardiovascular: regular rate Respiratory: CTAB anteriorly, normal respiratory effort, and on RA Gastrointestinal: non-distended, no rebound tenderness or guarding, and tenderness localized to bilateral lower quadrants Extremities: moving all extremities spontaneously Neuro: following commands and no focal neurological deficits   Significant Procedures: none  Significant Labs and Imaging:  Recent Labs  Lab 08/31/21 1809 08/31/21 1824 09/01/21 0355 09/02/21 0427  WBC 8.5  --  9.0 6.0  HGB 13.3 15.0 12.4 11.1*  HCT 43.1 44.0 40.5 33.7*  PLT 377  --  307 269   Recent Labs  Lab 08/31/21 1809 08/31/21 1824 09/01/21 0355 09/01/21 0930 09/02/21 0427   NA 130*  130* 137 135 137  K 5.0 5.1 4.4 3.8 3.1*  CL 99  --  112* 111 108  CO2 <7*  --  9* 13* 22  GLUCOSE 657*  --  197* 188* 178*  BUN 24*  --  22* 14 8  CREATININE 1.40*  --  1.08* 0.88 0.55  CALCIUM 9.7  --  8.7* 8.5* 8.3*  MG  --   --  2.2  --  1.7  PHOS  --   --  3.4  --   --   ALKPHOS 128*  --  119  --  96  AST 23  --  15  --  19  ALT 21  --  18  --  15  ALBUMIN 3.6  --  3.2*  --  2.7*   Discharge Medications:  Allergies as of 09/02/2021       Reactions   Latex Itching, Swelling, Other (See Comments)   Causes skin irritation   Tape Other (See Comments)   Causes skin irritation   Shellfish Allergy Itching, Rash, Other (See Comments)   Reaction to scallops        Medication List     TAKE these medications    Accu-Chek Guide test strip Generic drug: glucose blood Use to check glucose 6x daily   Accu-Chek Guide w/Device Kit Use 3 (three) times daily before meals.   Accu-Chek Softclix Lancets lancets Please use to check blood sugar up to 6 times/day.   acetaminophen 325 MG tablet Commonly known as: TYLENOL Take 2 tablets (650 mg total) by mouth every 6 (six) hours as needed for mild pain (or Fever >/= 101).   albuterol 108 (90 Base) MCG/ACT inhaler Commonly known as: VENTOLIN HFA Inhale 2 puffs into the lungs every 6 (six) hours as needed for wheezing or shortness of breath.   Baqsimi One Pack 3 MG/DOSE Powd Generic drug: Glucagon Place 3 mg into the nose once as needed (low blood sugar).   blood glucose meter kit and supplies Dispense based on patient and insurance preference. Use up to four times daily as directed. (FOR ICD-10 E10.9, E11.9).   Dexcom G6 Sensor Misc Replace sensor every 10 days. What changed:  how much to take how to take this when to take this additional instructions   HumaLOG KwikPen 100 UNIT/ML KwikPen Generic drug: insulin lispro Inject 5-15 Units into the skin 3 times daily with meals- per sliding scale based on carb  count. (See admin instructions.)   Lantus SoloStar 100 UNIT/ML Solostar Pen Generic drug: insulin glargine Inject 30 Units into the skin daily.   Pentips 32G X 4 MM Misc Generic drug: Insulin Pen Needle USE TO INJECT INSULIN 6 TIMES DAILY   PRENATAL PO Take 1 tablet by mouth daily.   sertraline 25 MG tablet Commonly known as: ZOLOFT Take 3 tablets (75 mg total) by mouth daily. Start taking on: September 03, 2021 What changed:  medication strength Another medication with the same name was removed. Continue taking this medication, and follow the directions you see here.   SUMAtriptan 25 MG tablet Commonly known as: Imitrex Take 0.5 tablets (12.5 mg total) by mouth every 2 (two) hours as needed for migraine. May repeat in 2 hours if headache persists or recurs.       Discharge Instructions: Please refer to Patient Instructions section of EMR for full details.  Patient was counseled important signs and symptoms that should prompt return to medical care, changes in medications, dietary instructions, activity  restrictions, and follow up appointments.   Follow-Up Appointments:  Follow-up [place] Follow up on 09/15/2021.   Why: Please go to your scheduled appointment at 10:30AM. Please arrive at least 15 minutes early. Contact information: [place] [facility]                Camelia Phenes, MD 09/02/2021, 1:48 PM PGY-1, [place] Upper-Level Resident Addendum   I have independently interviewed and examined the patient. I have discussed the above with the original author and agree with their documentation. My edits for correction/addition/clarification are included where appropriate. Please see also any attending notes.   Sharion Settler, DO PGY-3, [place] [facility] 09/02/2021 2:11 PM  [place] Service pager: [phone] (text pages welcome through [facility])

## 2021-09-02 NOTE — Progress Notes (Signed)
Daily Progress Note Intern Pager: [phone]  Patient name: Dawn [name] Medical record number: [id number] Date of birth: [birth year] Age: 21 y.o. Gender: female  Primary Care Provider: Darral Dash, DO Consultants: none Code Status: FULL  Pt Overview and Major Events to Date:  8/29 - admitted  Assessment and Plan: 21 y.o. female with medical history significant for poorly controlled type 1 diabetes mellitus complicated by recurrent hospitalizations for DKA, depression, admitted with DKA after presenting complaining of elevated blood sugars.  * Diabetic ketoacidosis without coma associated with type 1 diabetes mellitus (HCC) In the setting of known history of poorly controlled type 1 diabetes with most recent A1c of greater than 15.5% with prior hospitalizations for DKA, dx of dka on basis of the following laboratory eval: presenting blood sugar of by CMP noted to be 590 also with blood gas consistent with metabolic acidosis,  while CMP demonstrates anion gap metabolic acidosis with serum bicarbonate of less than 7 and AG of 24, and beta hydroxybutyric acid found to be elevated. Additional presenting labs notable for corrected serum sodium of 138 and serum potassium of 5.0. Suspect contribution to presenting DKA from suboptimal compliance with outpatient insulin regimen. No e/o to suggest underlying precipitating infxn at this time,  including UA showing no e/o UTI.  CT abdomen/pelvis showed no evidence of acute intra-abdominal or acute intrapelvic process. Anion gap closed - admitted to FMTS progressive, attending Dr. Leveda [name] - continue Endotool - on carb modified diet - strict I&Os - diabetes educator consulted - address CGM access issues on d/c - continue pantoprazole  Epigastric pain Presented with 1 to 2 days of epigastric discomfort, in the absence of any acute peritoneal signs on physical exam.  Similar to the epigastric discomfort that the patient is experienced at times of  previous episodes of DKA.  CT abdomen/pelvis showed no evidence of acute process, as further detailed above.  Potential contributions include element of diabetic gastroparesis given poorly controlled diabetes with hemoglobin A1c greater than 15.5%.  Patient conveys that her abdominal discomfort typically improves with treatment of DKA. Lipase 28. Today, pain localized to bilateral lower quadrants - continue to monitor - continue pantoprazole   MDD (major depressive disorder), recurrent episode, moderate (HCC) Documented history of depression, on sertraline as an outpatient. - continue home sertraline 75 mg daily   FEN/GI: carb modified, pantoprazole PPx: enoxaparin Dispo:Home pending clinical improvement .  Subjective:  Patient states she feels fine. She denies nausea, vomiting, diarrhea. Reported last stool yesterday. Endorses abdominal pain.  Objective: Temp:  [98 F (36.7 C)-98.8 F (37.1 C)] 98.3 F (36.8 C) (08/31 1202) Pulse Rate:  [77-97] 82 (08/31 1202) Resp:  [13-17] 13 (08/31 1202) BP: (101-129)/(68-87) 103/87 (08/31 1202) SpO2:  [100 %] 100 % (08/31 1202) Physical Exam: General: well-appearing and in no acute distress HEENT: normocephalic and atraumatic Cardiovascular: regular rate Respiratory: CTAB anteriorly, normal respiratory effort, and on RA Gastrointestinal: non-distended, no rebound tenderness or guarding, and tenderness localized to bilateral lower quadrants Extremities: moving all extremities spontaneously Neuro: following commands and no focal neurological deficits  Laboratory: Most recent CBC Lab Results  Component Value Date   WBC 6.0 09/02/2021   HGB 11.1 (L) 09/02/2021   HCT 33.7 (L) 09/02/2021   MCV 86.4 09/02/2021   PLT 269 09/02/2021   Most recent BMP    Latest Ref Rng & Units 09/02/2021    4:27 AM  BMP  Glucose 70 - 99 mg/dL 824   BUN 6 - 20  mg/dL 8   Creatinine 0.35 - 0.09 mg/dL 3.81   Sodium 829 - 937 mmol/L 137   Potassium 3.5 -  5.1 mmol/L 3.1   Chloride 98 - 111 mmol/L 108   CO2 22 - 32 mmol/L 22   Calcium 8.9 - 10.3 mg/dL 8.3     Imaging/Diagnostic Tests: No new imaging  Augusto Gamble, MD 09/02/2021, 1:38 PM  PGY-1, [facility] FPTS Intern pager: [phone], text pages welcome Secure chat group [facility] Teaching Service

## 2021-09-02 NOTE — Plan of Care (Signed)
Problem: Skin Integrity: Goal: Risk for impaired skin integrity will decrease 09/02/2021 1329 by Noel Journey, RN Outcome: Adequate for Discharge 09/02/2021 1049 by Dian Queen A, RN Outcome: Progressing   Problem: Safety: Goal: Ability to remain free from injury will improve 09/02/2021 1329 by Noel Journey, RN Outcome: Adequate for Discharge 09/02/2021 1049 by Dian Queen A, RN Outcome: Progressing   Problem: Pain Managment: Goal: General experience of comfort will improve 09/02/2021 1329 by Noel Journey, RN Outcome: Adequate for Discharge 09/02/2021 1049 by Dian Queen A, RN Outcome: Progressing   Problem: Elimination: Goal: Will not experience complications related to bowel motility 09/02/2021 1329 by Noel Journey, RN Outcome: Adequate for Discharge 09/02/2021 1049 by Dian Queen A, RN Outcome: Progressing Goal: Will not experience complications related to urinary retention 09/02/2021 1329 by Noel Journey, RN Outcome: Adequate for Discharge 09/02/2021 1049 by Dian Queen A, RN Outcome: Progressing   Problem: Coping: Goal: Level of anxiety will decrease 09/02/2021 1329 by Noel Journey, RN Outcome: Adequate for Discharge 09/02/2021 1049 by Dian Queen A, RN Outcome: Progressing   Problem: Nutrition: Goal: Adequate nutrition will be maintained 09/02/2021 1329 by Noel Journey, RN Outcome: Adequate for Discharge 09/02/2021 1049 by Noel Journey, RN Outcome: Progressing   Problem: Activity: Goal: Risk for activity intolerance will decrease 09/02/2021 1329 by Dian Queen A, RN Outcome: Adequate for Discharge 09/02/2021 1049 by Dian Queen A, RN Outcome: Progressing   Problem: Clinical Measurements: Goal: Ability to maintain clinical measurements within normal limits will improve 09/02/2021 1329 by Noel Journey, RN Outcome: Adequate for  Discharge 09/02/2021 1049 by Dian Queen A, RN Outcome: Progressing Goal: Will remain free from infection 09/02/2021 1329 by Noel Journey, RN Outcome: Adequate for Discharge 09/02/2021 1049 by Dian Queen A, RN Outcome: Progressing Goal: Diagnostic test results will improve 09/02/2021 1329 by Noel Journey, RN Outcome: Adequate for Discharge 09/02/2021 1049 by Dian Queen A, RN Outcome: Progressing Goal: Respiratory complications will improve 09/02/2021 1329 by Noel Journey, RN Outcome: Adequate for Discharge 09/02/2021 1049 by Dian Queen A, RN Outcome: Progressing Goal: Cardiovascular complication will be avoided 09/02/2021 1329 by Noel Journey, RN Outcome: Adequate for Discharge 09/02/2021 1049 by Dian Queen A, RN Outcome: Progressing   Problem: Health Behavior/Discharge Planning: Goal: Ability to manage health-related needs will improve 09/02/2021 1329 by Noel Journey, RN Outcome: Adequate for Discharge 09/02/2021 1049 by Noel Journey, RN Outcome: Progressing   Problem: Education: Goal: Knowledge of General Education information will improve Description: Including pain rating scale, medication(s)/side effects and non-pharmacologic comfort measures 09/02/2021 1329 by Noel Journey, RN Outcome: Adequate for Discharge 09/02/2021 1049 by Dian Queen A, RN Outcome: Progressing   Problem: Tissue Perfusion: Goal: Adequacy of tissue perfusion will improve 09/02/2021 1329 by Noel Journey, RN Outcome: Adequate for Discharge 09/02/2021 1049 by Dian Queen A, RN Outcome: Progressing   Problem: Skin Integrity: Goal: Risk for impaired skin integrity will decrease 09/02/2021 1329 by Noel Journey, RN Outcome: Adequate for Discharge 09/02/2021 1049 by Dian Queen A, RN Outcome: Progressing   Problem: Nutritional: Goal: Maintenance of adequate  nutrition will improve 09/02/2021 1329 by Noel Journey, RN Outcome: Adequate for Discharge 09/02/2021 1049 by Dian Queen A, RN Outcome: Progressing Goal: Progress toward achieving an optimal weight will improve 09/02/2021 1329 by Noel Journey, RN Outcome: Adequate for Discharge 09/02/2021 1049 by Dian Queen A, RN Outcome: Progressing   Problem: Metabolic: Goal: Ability to maintain  appropriate glucose levels will improve 09/02/2021 1329 by Noel Journey, RN Outcome: Adequate for Discharge 09/02/2021 1049 by Noel Journey, RN Outcome: Progressing   Problem: Health Behavior/Discharge Planning: Goal: Ability to identify and utilize available resources and services will improve 09/02/2021 1329 by Noel Journey, RN Outcome: Adequate for Discharge 09/02/2021 1049 by Dian Queen A, RN Outcome: Progressing Goal: Ability to manage health-related needs will improve 09/02/2021 1329 by Noel Journey, RN Outcome: Adequate for Discharge 09/02/2021 1049 by Dian Queen A, RN Outcome: Progressing   Problem: Fluid Volume: Goal: Ability to maintain a balanced intake and output will improve 09/02/2021 1329 by Noel Journey, RN Outcome: Adequate for Discharge 09/02/2021 1049 by Dian Queen A, RN Outcome: Progressing   Problem: Coping: Goal: Ability to adjust to condition or change in health will improve 09/02/2021 1329 by Noel Journey, RN Outcome: Adequate for Discharge 09/02/2021 1049 by Dian Queen A, RN Outcome: Progressing   Problem: Education: Goal: Ability to describe self-care measures that may prevent or decrease complications (Diabetes Survival Skills Education) will improve 09/02/2021 1329 by Noel Journey, RN Outcome: Adequate for Discharge 09/02/2021 1049 by Noel Journey, RN Outcome: Progressing Goal: Individualized Educational Video(s) 09/02/2021 1329 by  Noel Journey, RN Outcome: Adequate for Discharge 09/02/2021 1049 by Noel Journey, RN Outcome: Progressing

## 2021-09-02 NOTE — Progress Notes (Signed)
Pt discharged home able to ambulate. Transport provided by family great aunt. Prescriptions reviewed and follow up apt scheduled. No questions or concerns at time of discharge no personal belongings left at bedside.

## 2021-09-02 NOTE — Assessment & Plan Note (Addendum)
In the setting of known history of poorly controlled type 1 diabetes with most recent A1c of greater than 15.5% with prior hospitalizations for DKA, dx of dka on basis of the following laboratory eval: presenting blood sugar of by CMP noted to be 590 also with blood gas consistent with metabolic acidosis,  while CMP demonstrates anion gap metabolic acidosis with serum bicarbonate of less than 7 and AG of 24, and beta hydroxybutyric acid found to be elevated. Additional presenting labs notable for corrected serum sodium of 138 and serum potassium of 5.0. Suspect contribution to presenting DKA from suboptimal compliance with outpatient insulin regimen. No e/o to suggest underlying precipitating infxn at this time,  including UA showing no e/o UTI.  CT abdomen/pelvis showed no evidence of acute intra-abdominal or acute intrapelvic process. Anion gap closed - admitted to FMTS progressive, attending Dr. Leveda [name] - continue Endotool - on carb modified diet - strict I&Os - diabetes educator consulted - address CGM access issues on d/c - continue pantoprazole

## 2021-09-02 NOTE — Progress Notes (Signed)
Family Medicine team sent a prescription for Dexcom sensors to [facility]. Called pharmacy to confirm co-pays. The sensors, insulin glargine, and insulin lispro all have $0 co-pays.  Rockwell Alexandria, PharmD, [facility] PGY1 Pharmacy Resident 09/02/2021 2:24 PM

## 2021-09-02 NOTE — Plan of Care (Signed)
Pt is A&Ox4 Neuro intact on RA no respiratory distress. SR/ST when ambulating. Independent with ADLs, continent adequate intake and appetite. LMB 8/30. PIV intact. Magnesium replaced today. CBG monitoring glucose trending and insulin per sliding scale.   Problem: Education: Goal: Ability to describe self-care measures that may prevent or decrease complications (Diabetes Survival Skills Education) will improve Outcome: Progressing Goal: Individualized Educational Video(s) Outcome: Progressing   Problem: Coping: Goal: Ability to adjust to condition or change in health will improve Outcome: Progressing   Problem: Fluid Volume: Goal: Ability to maintain a balanced intake and output will improve Outcome: Progressing   Problem: Metabolic: Goal: Ability to maintain appropriate glucose levels will improve Outcome: Progressing   Problem: Nutritional: Goal: Maintenance of adequate nutrition will improve Outcome: Progressing Goal: Progress toward achieving an optimal weight will improve Outcome: Progressing   Problem: Skin Integrity: Goal: Risk for impaired skin integrity will decrease Outcome: Progressing

## 2021-09-02 NOTE — Assessment & Plan Note (Addendum)
Documented history of depression, on sertraline as an outpatient. - continue home sertraline 75 mg daily

## 2021-09-02 NOTE — Assessment & Plan Note (Addendum)
Presented with 1 to 2 days of epigastric discomfort, in the absence of any acute peritoneal signs on physical exam.  Similar to the epigastric discomfort that the patient is experienced at times of previous episodes of DKA.  CT abdomen/pelvis showed no evidence of acute process, as further detailed above.  Potential contributions include element of diabetic gastroparesis given poorly controlled diabetes with hemoglobin A1c greater than 15.5%.  Patient conveys that her abdominal discomfort typically improves with treatment of DKA. Lipase 28. Today, pain localized to bilateral lower quadrants - continue to monitor - continue pantoprazole

## 2021-09-02 NOTE — TOC Transition Note (Signed)
Transition of Care [facility]) - CM/SW Discharge Note   Patient Details  Name: Dawn [name] MRN: [id number] Date of Birth: [birth year]  Transition of Care [facility]) CM/SW Contact:  Harriet Masson, RN Phone Number: 09/02/2021, 5:10 PM   Clinical Narrative:    Patient stable for discharge. Spoke to patient at bedside. Patient states she follows up with her PCP. Patient lives with a friend. Patient has transportation to apts. No TOC needs at this time.   Final next level of care: Home/Self Care Barriers to Discharge: Barriers Resolved   Patient Goals and CMS Choice  Return home      Discharge Placement               home        Discharge Plan and Services  home                                   Social Determinants of Health (SDOH) Interventions     Readmission Risk Interventions    12/19/2019    1:16 PM  Readmission Risk Prevention Plan  Transportation Screening Complete  PCP or Specialist Appt within 3-5 Days Complete  HRI or Home Care Consult Complete  Social Work Consult for Recovery Care Planning/Counseling Complete  Palliative Care Screening Not Applicable  Medication Review [employer]) Complete

## 2021-09-03 ENCOUNTER — Other Ambulatory Visit: Payer: Self-pay

## 2021-09-03 NOTE — Patient Instructions (Signed)
Visit Information  Ms. [name]  - as a part of your Medicaid benefit, you are eligible for care management and care coordination services at no cost or copay. I was unable to reach you by phone today but would be happy to help you with your health related needs. Please feel free to call me @ [phone]).   A member of the Managed Medicaid care management team will reach out to you again over the next 7 days.   Gus Puma, BSW, [facility]  [place]  High Risk Managed Medicaid Team  [phone]

## 2021-09-03 NOTE — Patient Outreach (Signed)
Care Coordination  09/03/2021  Maia Handa 10/29/2000 [id number]   Medicaid Managed Care   Unsuccessful Outreach Note  09/03/2021 Name: Dawn [name] MRN: [id number] DOB: 03-17-2000  Referred by: Darral Dash, DO Reason for referral : High Risk Managed Medicaid   An unsuccessful telephone outreach was attempted today. The patient was referred to the case management team for assistance with care management and care coordination.   Follow Up Plan: The patient has been provided with contact information for the care management team and has been advised to call with any health related questions or concerns.   Gus Puma, BSW, [facility]  [place]  High Risk Managed Medicaid Team  [phone]

## 2021-09-06 ENCOUNTER — Other Ambulatory Visit: Payer: Self-pay

## 2021-09-07 ENCOUNTER — Other Ambulatory Visit (HOSPITAL_COMMUNITY): Payer: Self-pay

## 2021-09-07 ENCOUNTER — Other Ambulatory Visit: Payer: Self-pay

## 2021-09-08 ENCOUNTER — Other Ambulatory Visit: Payer: Self-pay

## 2021-09-13 ENCOUNTER — Other Ambulatory Visit (HOSPITAL_COMMUNITY): Payer: Self-pay

## 2021-09-14 NOTE — Progress Notes (Deleted)
    SUBJECTIVE:   CHIEF COMPLAINT / HPI:   Dawn [name] is a 21 y.o. female who presents to the [facility] clinic today to discuss the following concerns:   Hospital F/U for DKA   PERTINENT  PMH / PSH: Poorly controlled type 1 diabetes, Poor social situation, housing insecurity  OBJECTIVE:   There were no vitals taken for this visit. ***  General: NAD, pleasant, able to participate in exam Cardiac: RRR, no murmurs. Respiratory: CTAB, normal effort, No wheezes, rales or rhonchi Abdomen: Bowel sounds present, nontender, nondistended, no hepatosplenomegaly. Extremities: no edema or cyanosis. Skin: warm and dry, no rashes noted Neuro: alert, no obvious focal deficits Psych: Normal affect and mood  ASSESSMENT/PLAN:   No problem-specific Assessment & Plan notes found for this encounter.     Sabino Dick, DO [facility]

## 2021-09-15 ENCOUNTER — Inpatient Hospital Stay: Payer: Self-pay | Admitting: Family Medicine

## 2021-09-29 ENCOUNTER — Observation Stay (HOSPITAL_COMMUNITY)
Admission: EM | Admit: 2021-09-29 | Discharge: 2021-09-30 | Disposition: A | Discharge status: Home / Self Care | Payer: Medicaid Other | Attending: Family Medicine | Admitting: Family Medicine

## 2021-09-29 ENCOUNTER — Other Ambulatory Visit: Payer: Self-pay

## 2021-09-29 DIAGNOSIS — E0810 Diabetes mellitus due to underlying condition with ketoacidosis without coma: Secondary | ICD-10-CM

## 2021-09-29 DIAGNOSIS — Z794 Long term (current) use of insulin: Secondary | ICD-10-CM | POA: Diagnosis not present

## 2021-09-29 DIAGNOSIS — E1010 Type 1 diabetes mellitus with ketoacidosis without coma: Principal | ICD-10-CM | POA: Insufficient documentation

## 2021-09-29 DIAGNOSIS — R739 Hyperglycemia, unspecified: Secondary | ICD-10-CM | POA: Diagnosis present

## 2021-09-29 DIAGNOSIS — Z7951 Long term (current) use of inhaled steroids: Secondary | ICD-10-CM | POA: Insufficient documentation

## 2021-09-29 DIAGNOSIS — F32A Depression, unspecified: Secondary | ICD-10-CM | POA: Diagnosis present

## 2021-09-29 DIAGNOSIS — J45909 Unspecified asthma, uncomplicated: Secondary | ICD-10-CM | POA: Diagnosis not present

## 2021-09-29 DIAGNOSIS — F17290 Nicotine dependence, other tobacco product, uncomplicated: Secondary | ICD-10-CM | POA: Diagnosis not present

## 2021-09-29 DIAGNOSIS — E1110 Type 2 diabetes mellitus with ketoacidosis without coma: Secondary | ICD-10-CM | POA: Diagnosis present

## 2021-09-29 LAB — CBG MONITORING, ED
Glucose-Capillary: 342 mg/dL — ABNORMAL HIGH (ref 70–99)
Glucose-Capillary: 342 mg/dL — ABNORMAL HIGH (ref 70–99)
Glucose-Capillary: 320 mg/dL — ABNORMAL HIGH (ref 70–99)
Glucose-Capillary: 143 mg/dL — ABNORMAL HIGH (ref 70–99)
Glucose-Capillary: 104 mg/dL — ABNORMAL HIGH (ref 70–99)
Glucose-Capillary: 101 mg/dL — ABNORMAL HIGH (ref 70–99)
Glucose-Capillary: 116 mg/dL — ABNORMAL HIGH (ref 70–99)
Glucose-Capillary: 126 mg/dL — ABNORMAL HIGH (ref 70–99)

## 2021-09-29 LAB — CBC WITH DIFFERENTIAL/PLATELET
Abs Immature Granulocytes: 0.02 10*3/uL (ref 0.00–0.07)
Basophils Absolute: 0 10*3/uL (ref 0.0–0.1)
Basophils Relative: 1 %
Eosinophils Absolute: 0.1 10*3/uL (ref 0.0–0.5)
Eosinophils Relative: 2 %
HCT: 38.3 % (ref 36.0–46.0)
Hemoglobin: 12.3 g/dL (ref 12.0–15.0)
Immature Granulocytes: 0 %
Lymphocytes Relative: 58 %
Lymphs Abs: 3.3 10*3/uL (ref 0.7–4.0)
MCH: 29 pg (ref 26.0–34.0)
MCHC: 32.1 g/dL (ref 30.0–36.0)
MCV: 90.3 fL (ref 80.0–100.0)
Monocytes Absolute: 0.4 10*3/uL (ref 0.1–1.0)
Monocytes Relative: 7 %
Neutro Abs: 1.9 10*3/uL (ref 1.7–7.7)
Neutrophils Relative %: 32 %
Platelets: 343 10*3/uL (ref 150–400)
RBC: 4.24 MIL/uL (ref 3.87–5.11)
RDW: 14.2 % (ref 11.5–15.5)
WBC: 5.8 10*3/uL (ref 4.0–10.5)
nRBC: 0 % (ref 0.0–0.2)

## 2021-09-29 LAB — COMPREHENSIVE METABOLIC PANEL
ALT: 18 U/L (ref 0–44)
AST: 27 U/L (ref 15–41)
Albumin: 3.3 g/dL — ABNORMAL LOW (ref 3.5–5.0)
Alkaline Phosphatase: 131 U/L — ABNORMAL HIGH (ref 38–126)
Anion gap: 17 — ABNORMAL HIGH (ref 5–15)
BUN: 14 mg/dL (ref 6–20)
CO2: 13 mmol/L — ABNORMAL LOW (ref 22–32)
Calcium: 9.1 mg/dL (ref 8.9–10.3)
Chloride: 103 mmol/L (ref 98–111)
Creatinine, Ser: 1.1 mg/dL — ABNORMAL HIGH (ref 0.44–1.00)
GFR, Estimated: >60 mL/min (ref >60)
Glucose, Bld: 396 mg/dL — ABNORMAL HIGH (ref 70–99)
Potassium: 4.5 mmol/L (ref 3.5–5.1)
Sodium: 133 mmol/L — ABNORMAL LOW (ref 135–145)
Total Bilirubin: 2 mg/dL — ABNORMAL HIGH (ref 0.3–1.2)
Total Protein: 7.4 g/dL (ref 6.5–8.1)

## 2021-09-29 LAB — URINALYSIS, ROUTINE W REFLEX MICROSCOPIC
Bacteria, UA: NONE SEEN
Bilirubin Urine: NEGATIVE
Glucose, UA: >=500 mg/dL — AB
Hgb urine dipstick: NEGATIVE
Ketones, ur: 80 mg/dL — AB
Leukocytes,Ua: NEGATIVE
Nitrite: NEGATIVE
Protein, ur: NEGATIVE mg/dL
Specific Gravity, Urine: 1.028 (ref 1.005–1.030)
pH: 5 (ref 5.0–8.0)

## 2021-09-29 LAB — BASIC METABOLIC PANEL
Anion gap: 9 (ref 5–15)
BUN: 9 mg/dL (ref 6–20)
CO2: 19 mmol/L — ABNORMAL LOW (ref 22–32)
Calcium: 8.2 mg/dL — ABNORMAL LOW (ref 8.9–10.3)
Chloride: 110 mmol/L (ref 98–111)
Creatinine, Ser: 0.68 mg/dL (ref 0.44–1.00)
GFR, Estimated: >60 mL/min (ref >60)
Glucose, Bld: 100 mg/dL — ABNORMAL HIGH (ref 70–99)
Potassium: 3.7 mmol/L (ref 3.5–5.1)
Sodium: 138 mmol/L (ref 135–145)

## 2021-09-29 LAB — I-STAT VENOUS BLOOD GAS, ED
Acid-base deficit: 12 mmol/L — ABNORMAL HIGH (ref 0.0–2.0)
Bicarbonate: 13.2 mmol/L — ABNORMAL LOW (ref 20.0–28.0)
Calcium, Ion: 1.16 mmol/L (ref 1.15–1.40)
HCT: 37 % (ref 36.0–46.0)
Hemoglobin: 12.6 g/dL (ref 12.0–15.0)
O2 Saturation: 66 %
Potassium: 4.3 mmol/L (ref 3.5–5.1)
Sodium: 135 mmol/L (ref 135–145)
TCO2: 14 mmol/L — ABNORMAL LOW (ref 22–32)
pCO2, Ven: 28.3 mmHg — ABNORMAL LOW (ref 44–60)
pH, Ven: 7.277 (ref 7.25–7.43)
pO2, Ven: 38 mmHg (ref 32–45)

## 2021-09-29 LAB — POC URINE PREG, ED
Preg Test, Ur: NEGATIVE
Preg Test, Ur: NEGATIVE

## 2021-09-29 LAB — BETA-HYDROXYBUTYRIC ACID: Beta-Hydroxybutyric Acid: 7.56 mmol/L — ABNORMAL HIGH (ref 0.05–0.27)

## 2021-09-29 MED ORDER — ENOXAPARIN SODIUM 40 MG/0.4ML IJ SOSY: 40.0000 mg | PREFILLED_SYRINGE | INTRAMUSCULAR | Status: DC

## 2021-09-29 MED ORDER — SODIUM CHLORIDE 0.9 % IV BOLUS
1000.0000 mL | Freq: Once | INTRAVENOUS | Status: AC
  Administered 2021-09-29: 1000 mL via INTRAVENOUS

## 2021-09-29 MED ORDER — POTASSIUM CHLORIDE 10 MEQ/100ML IV SOLN
10.0000 meq | INTRAVENOUS | Status: AC
  Administered 2021-09-29 (×2): 10 meq via INTRAVENOUS
  Filled 2021-09-29 (×2): qty 100

## 2021-09-29 MED ORDER — DEXTROSE IN LACTATED RINGERS 5 % IV SOLN: INTRAVENOUS | Status: DC

## 2021-09-29 MED ORDER — DEXTROSE 50 % IV SOLN: 0.0000 mL | INTRAVENOUS | Status: DC | PRN

## 2021-09-29 MED ORDER — LACTATED RINGERS IV SOLN: INTRAVENOUS | Status: DC

## 2021-09-29 MED ORDER — INSULIN REGULAR(HUMAN) IN NACL 100-0.9 UT/100ML-% IV SOLN
INTRAVENOUS | Status: DC
  Administered 2021-09-29: 0.5 [IU]/h via INTRAVENOUS
  Administered 2021-09-29: 7 [IU]/h via INTRAVENOUS
  Filled 2021-09-29: qty 100

## 2021-09-29 MED ORDER — SERTRALINE HCL 50 MG PO TABS
75.0000 mg | ORAL_TABLET | Freq: Every day | ORAL | Status: DC
  Administered 2021-09-29 – 2021-09-30 (×2): 75 mg via ORAL
  Filled 2021-09-29 (×2): qty 2

## 2021-09-29 MED ORDER — LACTATED RINGERS IV BOLUS
20.0000 mL/kg | Freq: Once | INTRAVENOUS | Status: AC
  Administered 2021-09-29: 1000 mL via INTRAVENOUS

## 2021-09-29 NOTE — ED Notes (Signed)
Per endotool, insulin drip to be restarted at .5 units an hour.

## 2021-09-29 NOTE — ED Provider Triage Note (Signed)
Emergency Medicine Provider Triage Evaluation Note  Dawn [name] , a 21 y.o. female  was evaluated in triage.  Pt complains of hyperglycemia.  Patient states that over the past few weeks her blood sugars have been running higher than usual.  She continues to take her insulin as prescribed.  She states that she has been hungrier, Pharmacist, community, and has been urinating more than usual.  She denies any other symptoms at this time.  Denies abdominal pain, nausea, vomiting, dysuria, hematuria, headache, shortness of breath, chest pain.  She states that her primary care provider told her she may need to come to the emergency room for fluid administration  Review of Systems  Positive: As above Negative: Above  Physical Exam  There were no vitals taken for this visit. Gen:   Awake, no distress   Resp:  Normal effort  MSK:   Moves extremities without difficulty  Other:    Medical Decision Making  Medically screening exam initiated at 11:12 AM.  Appropriate orders placed.  Lynasia Meloche was informed that the remainder of the evaluation will be completed by another provider, this initial triage assessment does not replace that evaluation, and the importance of remaining in the ED until their evaluation is complete.     Dorothyann Peng, PA-C 09/29/21 1115

## 2021-09-29 NOTE — Assessment & Plan Note (Addendum)
Off endotool, AG closed x2. S/p 2x 10 mEq K. Transitioned to subcutaneous insulin and started on 10U basal and 3U mealtime. Per home meds, she was on 30 basal and 5-10U mealtime but patient reports she recently increased to 40 basal. Will monitor tolerating diet today. A1c pending.  - Monitor CBGs  - Dextrose 50 prn IV for low CBG - cardiac telemetry  - BMP in the AM - Strict I's and O's - would rediscuss home medication regimen

## 2021-09-29 NOTE — Assessment & Plan Note (Signed)
Patient compliant with Zoloft at home.  Her mood is stable. - Continue Zoloft

## 2021-09-29 NOTE — H&P (Cosign Needed Addendum)
Hospital Admission History and Physical Service Pager: [phone]  Patient name: Dawn [name] Medical record number: [id number] Date of Birth: 2000-07-23 Age: 21 y.o. Gender: female  Primary Care Provider: Darral Dash, DO Consultants: none  Code Status: full code Preferred Emergency Contact:  Primary Emergency Contact: Strout,[name] Home Phone: [phone] Mobile Phone: [phone] Relation: Aunt Secondary Emergency Contact: Nelson,Jamal Mobile Phone: [phone] Relation: Significant other  Chief Complaint: hyperglycemia   Assessment and Plan: Dawn [name] is a 21 y.o. female presenting with DKA . Differential for this patient's presentation of this includes non compliance with home diabetes medication in the setting of type 1 diabetes.    * DKA (diabetic ketoacidosis) (HCC) Blood glucose at presentation 396, bicarb 13, anion gap of 17.  S/p 1 L bolus with potassium replacement.  Patient normally takes 40 units Lantus at bedtime, and 20 units Humalog with meals.  Blood glucose is sporadic at home with a range of 50-500.  - Admit to FMTS, attending Dr. Miquel Dunn - Progressive to monitor DKA progression - NPO while on endotool  - Continue Endotool, insulin infusion  - Monitor CBGs per endotool protocol  - Dextrose 50 prn IV for low CBG -LR infusion 125 mL/hr with dextrose for CBG <250 and without dextrose ofr CBG >250 - cardiac telemetry  - pending A1c  - BMP every 4 hours - Strict I's and O's - Once anion gap closes x2 we will start transitioning to subq insulin  - Monitor potassium and replete as needed, s/p 2x 10 mEq replacement - BHB q8h - monitor for clinical status changes - lovenox for DVT ppx  - vitals per routine   Depression Patient compliant with Zoloft at home.  Her mood is stable. - Continue Zoloft    FEN/GI: NPO while on endotool  VTE Prophylaxis: Lovenox  Disposition: Progressive, attending Dr. Miquel Dunn  History of Present Illness:  Dawn [name] is a 21 y.o. female presenting with DKA. Sent by PCP for elevated blood sugars. Prior history of multiple hospitalizations for DKA.  She reports having decreased appetite last 2 days.  She has not eaten anything today.  She reports 1 episode of vomiting and 1 episode of diarrhea.  She denies new or worsening abdominal pain or chest pain.  For her type 2 diabetes she normally takes 40 units of Lantus at bedtime and 20 units Humalog with mealtime.  She has continuous glucose monitoring and reports her sugars have been in a range of 50-300.  She is scheduled to see an endocrinologist in October.  In the ED, she was feeling well with mild abdominal pain. Glucose 396, bicarb 13, anion gap of 17, UA neg and pregnancy test negative. She was started on endotool and given 1L bolus with potassium replacement.   Review Of Systems: Per HPI with the following additions: vomiting 1x, diarrhea 1x. Denies new or worsening chest or abdominal pain.   Pertinent Past Medical History: ADHD Asthma Depression T1DM Hx of DKA Migraines Remainder reviewed in history tab.   Pertinent Past Surgical History: I&D of perirectal abscess   Remainder reviewed in history tab.   Pertinent Social History: Tobacco use: yes, vaping  Alcohol use: former, denies current use  Other Substance use: Marijuana Lives with aunt   Pertinent Family History: Mother: asthma and goiter Father: heart disease  Remainder reviewed in history tab.   Important Outpatient Medications: Albuterol inhaler  Lantus 30 units daily  Humalog 5-15 units with meals  Zoloft Imitrex  Remainder reviewed in  medication history.   Objective: BP (!) 128/94   Pulse 92   Temp 97.6 F (36.4 C) (Oral)   Resp 15   Ht 5\' 2"  (1.575 m)   Wt 49.9 kg   SpO2 100%   BMI 20.12 kg/m  Exam: General: Well-appearing, no acute distress HEENT: moist mucous membranes, non-tender thyroid, no evidence of cervical LAD Cardiovascular: Regular rate,  regular rhythm, no murmurs on exam Respiratory: Clear, no wheezing, no consolidations. Normal work of breathing, breathing comfortably on room air.  Gastrointestinal: soft, bowel sounds present, nondistended, nontender Ext: no LE edema noted bilaterally, distal pulses strong and equal bilaterally  Neuro: Alert and oriented x4, no facial asymmetry Psych: Pleasant but upset about hospital admission  Labs:  CBC BMET  Recent Labs  Lab 09/29/21 1604 09/29/21 1902  WBC 5.8  --   HGB 12.3 12.6  HCT 38.3 37.0  PLT 343  --    Recent Labs  Lab 09/29/21 1604 09/29/21 1902  NA 133* 135  K 4.5 4.3  CL 103  --   CO2 13*  --   BUN 14  --   CREATININE 1.10*  --   GLUCOSE 396*  --   CALCIUM 9.1  --     Pertinent additional labs anion gap 17, CBG 320.   Darci Current, DO 09/29/2021, 8:36 PM PGY-1,  Family Medicine  I was personally present and performed or re-performed the history, physical exam and medical decision making activities of this service and have verified that the service and findings are accurately documented in the intern's note. My edits are noted within the note above. Please also see attending's attestation.   Donney Dice, DO                  09/29/2021, 9:50 PM  PGY-3, [place] Intern pager: [phone], text pages welcome Secure chat group [place]

## 2021-09-29 NOTE — ED Provider Notes (Signed)
[facility] EMERGENCY DEPARTMENT Provider Note   CSN: [id number] Arrival date & time: 09/29/21  1039     History DMT1 Chief Complaint  Patient presents with   Hyperglycemia    Dawn [name] is a 21 y.o. female.  21 y.o female with a PMH of DMT1 presents to the ED and then by PCP due to elevated blood sugar.  Patient reports her doctor messaged her this morning in order to be seen in the emergency department and obtain fluids has her glucose was in the 300s.  Patient reports no complaints at this time, she is having some vague lower right-sided abdominal pain however no nausea or vomiting.  She is on the sliding scale with Lantus along with Humalog.  She has not eaten over the last couple hours and is very hungry at this time.  She has not been sick, no fevers, no vomiting, no urinary symptoms, no other complaints.  The history is provided by the patient and medical records.  Hyperglycemia Blood sugar level PTA:  342 Severity:  Moderate Onset quality:  Gradual Duration:  1 day Associated symptoms: abdominal pain   Associated symptoms: no chest pain, no fever, no nausea, no shortness of breath and no vomiting        Home Medications Prior to Admission medications   Medication Sig Start Date End Date Taking? Authorizing Provider  Accu-Chek Softclix Lancets lancets Please use to check blood sugar up to 6 times/day. 08/05/21   August Albino, MD  acetaminophen (TYLENOL) 325 MG tablet Take 2 tablets (650 mg total) by mouth every 6 (six) hours as needed for mild pain (or Fever >/= 101). 09/02/21   Sharion Settler, DO  albuterol (VENTOLIN HFA) 108 (90 Base) MCG/ACT inhaler Inhale 2 puffs into the lungs every 6 (six) hours as needed for wheezing or shortness of breath. Patient not taking: Reported on 08/05/2021 03/24/21   [name], MD  blood glucose meter kit and supplies Dispense based on patient and insurance preference. Use up to four times daily as directed. (FOR  ICD-10 E10.9, E11.9). 08/05/21   August Albino, MD  Blood Glucose Monitoring Suppl (ACCU-CHEK GUIDE ME) w/Device KIT Use 3 (three) times daily before meals. 08/05/21   August Albino, MD  Continuous Blood Gluc Sensor (DEXCOM G6 SENSOR) MISC Replace sensor every 10 days. 09/02/21   Sharion Settler, DO  Glucagon (BAQSIMI ONE PACK) 3 MG/DOSE POWD Place 3 mg into the nose once as needed (low blood sugar). Patient not taking: Reported on 08/05/2021    [provider]  glucose blood (ACCU-CHEK GUIDE) test strip Use to check glucose 6 times daily 08/05/21   August Albino, MD  insulin glargine (LANTUS SOLOSTAR) 100 UNIT/ML Solostar Pen Inject 30 Units into the skin daily. 08/05/21   August Albino, MD  insulin lispro (HUMALOG KWIKPEN) 100 UNIT/ML KwikPen Inject 5-15 Units into the skin 3 times daily with meals- per sliding scale based on carb count. (See admin instructions.) 08/05/21 09/04/21  August Albino, MD  Insulin Pen Needle (BD PEN NEEDLE NANO 2ND GEN) 32G X 4 MM MISC USE TO INJECT INSULIN 6 TIMES DAILY 08/05/21   August Albino, MD  Prenatal Vit-Fe Fumarate-FA (PRENATAL PO) Take 1 tablet by mouth daily.    [provider]  sertraline (ZOLOFT) 25 MG tablet Take 3 tablets (75 mg total) by mouth daily. 09/03/21   Sharion Settler, DO  SUMAtriptan (IMITREX) 25 MG tablet Take 0.5 tablets (12.5 mg total) by mouth every 2 (two) hours  as needed for migraine. May repeat in 2 hours if headache persists or recurs. 04/12/21   [name], MD      Allergies    Latex, Tape, and Shellfish allergy    Review of Systems   Review of Systems  Constitutional:  Negative for fever.  HENT:  Negative for sore throat.   Respiratory:  Negative for shortness of breath.   Cardiovascular:  Negative for chest pain.  Gastrointestinal:  Positive for abdominal pain. Negative for nausea and vomiting.  Genitourinary:  Negative for difficulty urinating and flank pain.  Musculoskeletal:  Negative for back pain.   Neurological:  Negative for light-headedness and headaches.  All other systems reviewed and are negative.   Physical Exam Updated Vital Signs BP 119/88   Pulse 82   Temp 97.6 F (36.4 C) (Oral)   Resp 16   Ht 5' 2"  (1.575 m)   Wt 49.9 kg   SpO2 100%   BMI 20.12 kg/m  Physical Exam Vitals and nursing note reviewed.  Constitutional:      Appearance: Normal appearance.     Comments: Resting comfortably in hallway bed  HENT:     Head: Normocephalic and atraumatic.     Mouth/Throat:     Mouth: Mucous membranes are moist.  Eyes:     Pupils: Pupils are equal, round, and reactive to light.  Cardiovascular:     Rate and Rhythm: Normal rate.  Pulmonary:     Effort: Pulmonary effort is normal.  Abdominal:     General: Abdomen is flat.     Tenderness: There is no abdominal tenderness.  Musculoskeletal:     Cervical back: Normal range of motion and neck supple.  Skin:    General: Skin is warm and dry.  Neurological:     Mental Status: She is alert and oriented to person, place, and time.     ED Results / Procedures / Treatments   Labs (all labs ordered are listed, but only abnormal results are displayed) Labs Reviewed  COMPREHENSIVE METABOLIC PANEL - Abnormal; Notable for the following components:      Result Value   Sodium 133 (*)    CO2 13 (*)    Glucose, Bld 396 (*)    Creatinine, Ser 1.10 (*)    Albumin 3.3 (*)    Alkaline Phosphatase 131 (*)    Total Bilirubin 2.0 (*)    Anion gap 17 (*)    All other components within normal limits  URINALYSIS, ROUTINE W REFLEX MICROSCOPIC - Abnormal; Notable for the following components:   Color, Urine STRAW (*)    Glucose, UA >=500 (*)    Ketones, ur 80 (*)    All other components within normal limits  BETA-HYDROXYBUTYRIC ACID - Abnormal; Notable for the following components:   Beta-Hydroxybutyric Acid 7.56 (*)    All other components within normal limits  CBG MONITORING, ED - Abnormal; Notable for the following  components:   Glucose-Capillary 342 (*)    All other components within normal limits  CBG MONITORING, ED - Abnormal; Notable for the following components:   Glucose-Capillary 342 (*)    All other components within normal limits  I-STAT VENOUS BLOOD GAS, ED - Abnormal; Notable for the following components:   pCO2, Ven 28.3 (*)    Bicarbonate 13.2 (*)    TCO2 14 (*)    Acid-base deficit 12.0 (*)    All other components within normal limits  CBG MONITORING, ED - Abnormal; Notable for the following  components:   Glucose-Capillary 320 (*)    All other components within normal limits  CBC WITH DIFFERENTIAL/PLATELET  BASIC METABOLIC PANEL  BASIC METABOLIC PANEL  BASIC METABOLIC PANEL  BETA-HYDROXYBUTYRIC ACID  POC URINE PREG, ED  POC URINE PREG, ED    EKG None  Radiology No results found.  Procedures .Critical Care  Performed by: Janeece Fitting, PA-C Authorized by: Janeece Fitting, PA-C   Critical care provider statement:    Critical care time (minutes):  45   Critical care start time:  09/29/2021 6:45 PM   Critical care end time:  09/29/2021 7:15 PM   Critical care was necessary to treat or prevent imminent or life-threatening deterioration of the following conditions:  Endocrine crisis   Critical care was time spent personally by me on the following activities:  Discussions with consultants and discussions with primary provider   Care discussed with: admitting provider       Medications Ordered in ED Medications  insulin regular, human (MYXREDLIN) 100 units/ 100 mL infusion (7 Units/hr Intravenous New Bag/Given 09/29/21 1908)  lactated ringers infusion ( Intravenous New Bag/Given 09/29/21 1903)  dextrose 5 % in lactated ringers infusion (0 mLs Intravenous Hold 09/29/21 1912)  dextrose 50 % solution 0-50 mL (has no administration in time range)  potassium chloride 10 mEq in 100 mL IVPB (10 mEq Intravenous New Bag/Given 09/29/21 1859)  sodium chloride 0.9 % bolus 1,000 mL (0 mLs  Intravenous Stopped 09/29/21 1740)  lactated ringers bolus 998 mL (1,000 mLs Intravenous New Bag/Given 09/29/21 1858)    ED Course/ Medical Decision Making/ A&P                           Medical Decision Making Risk Prescription drug management. Decision regarding hospitalization.     This patient presents to the ED for concern of hyperglycemia, this involves a number of treatment options, and is a complaint that carries with it a high risk of complications and morbidity.  The differential diagnosis includes DKA, HHS versus infection.    Co morbidities: Discussed in HPI   Brief History:  Type I diabetic sent in by PCP with hyperglycemia and a blood sugar of 300 prior to arrival.  She has no complaints at this time aside for the fact that she is hungry.  She denies any recent sickness, no fever, no urinary symptoms, no nausea or vomiting.  EMR reviewed including pt PMHx, past surgical history and past visits to ER.   See HPI for more details   Lab Tests:  I ordered and independently interpreted labs.  The pertinent results include:    Labs notable for CBC which is unremarkable, hemoglobin is within normal limits. CMP is remarkable for a glucose of 396, bicarb of 13 and anion gap of 17, high suspicion for DKA. Will follow with VBG. UA with no signs of infection. Pregnancy test is negative. Venous blood gas with a normal 7.2 however obtained after given a liter of fluids. Beta hydroxybutyric 7.56   Imaging Studies:  No imaging studies ordered for this patient   Medicines ordered:  I ordered medication including bolus  for hyperglycemia Reevaluation of the patient after these medicines showed that the patient stayed the same I have reviewed the patients home medicines and have made adjustments as needed   Critical Interventions:  Patient was started on glucose stabilizer endotool, given 7 units of regular insulin along with potassium replacement.    Reevaluation:  After  the interventions noted above I re-evaluated patient and found that they have :stayed the same   Social Determinants of Health:  The patient's social determinants of health were a factor in the care of this patient    Problem List / ED Course:  She here with complaint of hyperglycemia sent in by PCP for hydration with fluids after blood sugar in the 300s.  She is currently on a sliding scale with Lantus along with Humalog.  Not endorsing any complaints. Extensive review of patient's records includes a recent admission approximately a month ago for recurrent DKA for poorly controlled diabetes.  It does look like her A1c is greater than 15.  Her home regimen consist of Lantus 30 unit SQ daily as well as sliding scale Humalog. Labs on today's visit with a anion gap of 17, bicarb of 13 worsen than her prior labs consistent with DKA.  I discussed extensively with patient that she will require admission for DKA, patient reports that she was told she would only be receiving fluids and going home.  After several conversations with patient she is agreeable of staying in obtaining treatment at this time.  Endo tool was started, will consult the [facility] in order to have patient admitted for further management of DKA.She is hemodynamically stable.    Dispostion:  After consideration of the diagnostic results and the patients response to treatment, I feel that the patent would benefit from admission for further management of her DKA.    Portions of this note were generated with Lobbyist. Dictation errors may occur despite best attempts at proofreading.  Final Clinical Impression(s) / ED Diagnoses Final diagnoses:  Hyperglycemia  Diabetic ketoacidosis without coma associated with type 1 diabetes mellitus [facility])    Rx / DC Orders ED Discharge Orders     None         Janeece Fitting, PA-C 09/29/21 1934    Lennice Sites, DO 09/29/21 1948

## 2021-09-29 NOTE — ED Triage Notes (Signed)
EMS stated, she is here for hyperglycemia  with lower abdominal pain. Pt has a glucometer measures at 200 to 350. She takes Lantus at night and humalog before meals.

## 2021-09-29 NOTE — ED Notes (Signed)
IV team paged due to difficult stick

## 2021-09-29 NOTE — Hospital Course (Signed)
Dawn [name] is a 21 y.o.female with a history of DM1 who was admitted to the [place] at [facility] for hyperglycemia. Her hospital course is detailed below:  DKA  Has been going to see [facility] who has been assisting with financial medical resources. She got labs at that location and was told to come to the ED. Denied confusion. Endorsed right abdominal pain but this always persist so she did not think much of this at the time. Glucose levels at home recently have been 300s, has had hypoglycemic episode of 50 when she was not eating much a few days ago. In ED, AG 17, Gluc ~400 meeting DKA and endotool was started while NPO. Patient's CBGs appropriately downtrended and AG negative x2. Transitioned to subQ and started back on a diet. Tolerated this well. Discharged on Lantus 20U, and home mealtime insulin. Instructed the patient to titrate up to home dose as indicated by glucose levels at home (recently increased to 40U Lantus outpatient). She has close follow up with endocrinology and PCP at [facility].  Electrolyte Disarray Repleted as indicated, monitored closely for hypokalemia.   PCP Follow-up Recommendations: Discuss insulin regimen and recent CBGs BMP and CBC on follow up  Encourage discontinuing vaping

## 2021-09-30 DIAGNOSIS — E1010 Type 1 diabetes mellitus with ketoacidosis without coma: Secondary | ICD-10-CM | POA: Diagnosis not present

## 2021-09-30 DIAGNOSIS — F32A Depression, unspecified: Secondary | ICD-10-CM | POA: Diagnosis not present

## 2021-09-30 LAB — BASIC METABOLIC PANEL
Anion gap: 11 (ref 5–15)
Anion gap: 11 (ref 5–15)
BUN: 5 mg/dL — ABNORMAL LOW (ref 6–20)
BUN: <5 mg/dL — ABNORMAL LOW (ref 6–20)
CO2: 18 mmol/L — ABNORMAL LOW (ref 22–32)
CO2: 20 mmol/L — ABNORMAL LOW (ref 22–32)
Calcium: 8.6 mg/dL — ABNORMAL LOW (ref 8.9–10.3)
Calcium: 9 mg/dL (ref 8.9–10.3)
Chloride: 108 mmol/L (ref 98–111)
Chloride: 105 mmol/L (ref 98–111)
Creatinine, Ser: 0.61 mg/dL (ref 0.44–1.00)
Creatinine, Ser: 0.61 mg/dL (ref 0.44–1.00)
GFR, Estimated: >60 mL/min (ref >60)
GFR, Estimated: >60 mL/min (ref >60)
Glucose, Bld: 118 mg/dL — ABNORMAL HIGH (ref 70–99)
Glucose, Bld: 125 mg/dL — ABNORMAL HIGH (ref 70–99)
Potassium: 3.4 mmol/L — ABNORMAL LOW (ref 3.5–5.1)
Potassium: 3.7 mmol/L (ref 3.5–5.1)
Sodium: 137 mmol/L (ref 135–145)
Sodium: 136 mmol/L (ref 135–145)

## 2021-09-30 LAB — URINALYSIS, ROUTINE W REFLEX MICROSCOPIC
Bilirubin Urine: NEGATIVE
Glucose, UA: >=500 mg/dL — AB
Hgb urine dipstick: NEGATIVE
Ketones, ur: 80 mg/dL — AB
Nitrite: NEGATIVE
Protein, ur: NEGATIVE mg/dL
Specific Gravity, Urine: 1.017 (ref 1.005–1.030)
pH: 5 (ref 5.0–8.0)

## 2021-09-30 LAB — COMPREHENSIVE METABOLIC PANEL
ALT: 14 U/L (ref 0–44)
AST: 14 U/L — ABNORMAL LOW (ref 15–41)
Albumin: 2.7 g/dL — ABNORMAL LOW (ref 3.5–5.0)
Alkaline Phosphatase: 111 U/L (ref 38–126)
Anion gap: 12 (ref 5–15)
BUN: 6 mg/dL (ref 6–20)
CO2: 17 mmol/L — ABNORMAL LOW (ref 22–32)
Calcium: 8.6 mg/dL — ABNORMAL LOW (ref 8.9–10.3)
Chloride: 107 mmol/L (ref 98–111)
Creatinine, Ser: 0.67 mg/dL (ref 0.44–1.00)
GFR, Estimated: >60 mL/min (ref >60)
Glucose, Bld: 112 mg/dL — ABNORMAL HIGH (ref 70–99)
Potassium: 3.1 mmol/L — ABNORMAL LOW (ref 3.5–5.1)
Sodium: 136 mmol/L (ref 135–145)
Total Bilirubin: 1.1 mg/dL (ref 0.3–1.2)
Total Protein: 6.2 g/dL — ABNORMAL LOW (ref 6.5–8.1)

## 2021-09-30 LAB — CBG MONITORING, ED
Glucose-Capillary: 107 mg/dL — ABNORMAL HIGH (ref 70–99)
Glucose-Capillary: 112 mg/dL — ABNORMAL HIGH (ref 70–99)
Glucose-Capillary: 115 mg/dL — ABNORMAL HIGH (ref 70–99)
Glucose-Capillary: 117 mg/dL — ABNORMAL HIGH (ref 70–99)
Glucose-Capillary: 122 mg/dL — ABNORMAL HIGH (ref 70–99)
Glucose-Capillary: 148 mg/dL — ABNORMAL HIGH (ref 70–99)
Glucose-Capillary: 171 mg/dL — ABNORMAL HIGH (ref 70–99)
Glucose-Capillary: 183 mg/dL — ABNORMAL HIGH (ref 70–99)
Glucose-Capillary: 193 mg/dL — ABNORMAL HIGH (ref 70–99)
Glucose-Capillary: 210 mg/dL — ABNORMAL HIGH (ref 70–99)
Glucose-Capillary: 345 mg/dL — ABNORMAL HIGH (ref 70–99)
Glucose-Capillary: 41 mg/dL — CL (ref 70–99)

## 2021-09-30 LAB — BETA-HYDROXYBUTYRIC ACID
Beta-Hydroxybutyric Acid: 2.37 mmol/L — ABNORMAL HIGH (ref 0.05–0.27)
Beta-Hydroxybutyric Acid: 2.02 mmol/L — ABNORMAL HIGH (ref 0.05–0.27)

## 2021-09-30 LAB — CBC
HCT: 34.1 % — ABNORMAL LOW (ref 36.0–46.0)
Hemoglobin: 11 g/dL — ABNORMAL LOW (ref 12.0–15.0)
MCH: 28.8 pg (ref 26.0–34.0)
MCHC: 32.3 g/dL (ref 30.0–36.0)
MCV: 89.3 fL (ref 80.0–100.0)
Platelets: 300 10*3/uL (ref 150–400)
RBC: 3.82 MIL/uL — ABNORMAL LOW (ref 3.87–5.11)
RDW: 14.2 % (ref 11.5–15.5)
WBC: 6.8 10*3/uL (ref 4.0–10.5)
nRBC: 0 % (ref 0.0–0.2)

## 2021-09-30 LAB — GLUCOSE, CAPILLARY: Glucose-Capillary: 303 mg/dL — ABNORMAL HIGH (ref 70–99)

## 2021-09-30 LAB — MAGNESIUM: Magnesium: 1.7 mg/dL (ref 1.7–2.4)

## 2021-09-30 MED ORDER — POTASSIUM CHLORIDE 10 MEQ/100ML IV SOLN
10.0000 meq | INTRAVENOUS | Status: AC
  Administered 2021-09-30: 10 meq via INTRAVENOUS
  Filled 2021-09-30: qty 100

## 2021-09-30 MED ORDER — INSULIN ASPART 100 UNIT/ML IJ SOLN
3.0000 [IU] | Freq: Three times a day (TID) | INTRAMUSCULAR | Status: DC
  Administered 2021-09-30 (×3): 3 [IU] via SUBCUTANEOUS

## 2021-09-30 MED ORDER — INSULIN ASPART 100 UNIT/ML IJ SOLN
0.0000 [IU] | Freq: Three times a day (TID) | INTRAMUSCULAR | Status: DC
  Administered 2021-09-30: 3 [IU] via SUBCUTANEOUS
  Administered 2021-09-30: 7 [IU] via SUBCUTANEOUS
  Administered 2021-09-30: 3 [IU] via SUBCUTANEOUS

## 2021-09-30 MED ORDER — INSULIN GLARGINE-YFGN 100 UNIT/ML ~~LOC~~ SOLN
10.0000 [IU] | Freq: Every day | SUBCUTANEOUS | Status: DC
  Administered 2021-09-30: 10 [IU] via SUBCUTANEOUS
  Filled 2021-09-30: qty 0.1

## 2021-09-30 MED ORDER — POTASSIUM CHLORIDE 10 MEQ/100ML IV SOLN
10.0000 meq | INTRAVENOUS | Status: DC
  Administered 2021-09-30 (×3): 10 meq via INTRAVENOUS
  Filled 2021-09-30 (×3): qty 100

## 2021-09-30 MED ORDER — INSULIN ASPART 100 UNIT/ML IJ SOLN
7.0000 [IU] | Freq: Once | INTRAMUSCULAR | Status: AC
  Administered 2021-09-30: 7 [IU] via SUBCUTANEOUS

## 2021-09-30 MED ORDER — LANTUS SOLOSTAR 100 UNIT/ML ~~LOC~~ SOPN: 20.0000 [IU] | PEN_INJECTOR | Freq: Every day | SUBCUTANEOUS | 11 refills | Status: DC

## 2021-09-30 NOTE — Inpatient Diabetes Management (Signed)
Inpatient Diabetes Program Recommendations  AACE/ADA: New Consensus Statement on Inpatient Glycemic Control (2015)  Target Ranges:  Prepandial:   less than 140 mg/dL      Peak postprandial:   less than 180 mg/dL (1-2 hours)      Critically ill patients:  140 - 180 mg/dL   Lab Results  Component Value Date   GLUCAP 171 (H) 09/30/2021   HGBA1C >15.5 (H) 09/01/2021    Review of Glycemic Control  Diabetes history: DM type 1 Outpatient Diabetes medications: Lantus 40 units, Humalog 5-15 units tid, Dexcom Current orders for Inpatient glycemic control:  Semglee 10 units Novolog 0-9 units tid Novolog 3 units tid meal coverage  A1c chronically elevated >15%  Inpatient Diabetes Program Recommendations:    Pt currently transitioning off IV insulin to SQ. Pt is well known to our team and has had chronically elevated A1c readings. Pt has had 6 admissions and 3 ED visits in the last 6 months. Mostly for hyperglycemia/DKA. Last occasion Diabetes Coordinator spoke with pt was on 8/2, 8/3.   Will watch glucose trends on current regimen.  Thanks,  Tama Headings RN, MSN, BC-ADM Inpatient Diabetes Coordinator Team Pager [phone] (8a-5p)

## 2021-09-30 NOTE — Discharge Summary (Signed)
[facility] Discharge Summary  Patient name: Dawn [name] Medical record number: [id number] Date of birth: 05/05/2000 Age: 21 y.o. Gender: female Date of Admission: 09/29/2021  Date of Discharge: 09/30/2021 Admitting Physician: Lenoria Chime, MD  Primary Care Provider: Orvis Brill, DO Consultants: None  Indication for Hospitalization: DKA  Discharge Diagnoses/Problem List:  Problem for Admission: Principal Problem:   DKA (diabetic ketoacidosis) [facility]) Active Problems:   Depression   Hyperglycemia   Brief Hospital Course:  Dawn [name] is a 21 y.o.female with a history of DM1 who was admitted to the [place] [facility] for hyperglycemia. Her hospital course is detailed below:  DKA  Has been going to see [facility] who has been assisting with financial medical resources. She got labs at that location and was told to come to the ED. Denied confusion. Endorsed right abdominal pain but this always persist so she did not think much of this at the time. Glucose levels at home recently have been 300s, has had hypoglycemic episode of 50 when she was not eating much a few days ago. In ED, AG 17, Gluc ~400 meeting DKA and endotool was started while NPO. Patient's CBGs appropriately downtrended and AG negative x2. Transitioned to subQ and started back on a diet. Tolerated this well. Discharged on Lantus 20U, and home mealtime insulin. Instructed the patient to titrate up to home dose as indicated by glucose levels at home (recently increased to 40U Lantus outpatient). She has close follow up with endocrinology and PCP at [facility].  Electrolyte Disarray Repleted as indicated, monitored closely for hypokalemia.   PCP Follow-up Recommendations: Discuss insulin regimen and recent CBGs BMP and CBC on follow up  Encourage discontinuing vaping    Disposition: Home  Discharge Condition: Stable   Discharge Exam:  Vitals:   09/30/21 1804  09/30/21 1805  BP: (!) 123/95   Pulse: 83   Resp: 20   Temp:  (!) 97.4 F (36.3 C)  SpO2: 100%    Physical Exam per Dr. Lurline Hare: General: alert, well-appearing young African American adult in no acute distress Cardiovascular: RRR. No m/r/g. Respiratory: CTAB. Normal WOB on RA. Abdomen: Soft, non-tender, non-distended Extremities: No peripheral edema  Neuro: alert and answering questions appropriately  Significant Procedures: None  Significant Labs and Imaging:  Recent Labs  Lab 09/29/21 1604 09/29/21 1902 09/30/21 0115  WBC 5.8  --  6.8  HGB 12.3 12.6 11.0*  HCT 38.3 37.0 34.1*  PLT 343  --  300   Recent Labs  Lab 09/29/21 1604 09/29/21 1902 09/29/21 2106 09/30/21 0115 09/30/21 0233 09/30/21 0545  NA 133* 135 138 136 137 136  K 4.5 4.3 3.7 3.1* 3.4* 3.7  CL 103  --  110 107 108 105  CO2 13*  --  19* 17* 18* 20*  GLUCOSE 396*  --  100* 112* 118* 125*  BUN 14  --  9 6 5* <5*  CREATININE 1.10*  --  0.68 0.67 0.61 0.61  CALCIUM 9.1  --  8.2* 8.6* 8.6* 9.0  MG  --   --   --  1.7  --   --   ALKPHOS 131*  --   --  111  --   --   AST 27  --   --  14*  --   --   ALT 18  --   --  14  --   --   ALBUMIN 3.3*  --   --  2.7*  --   --     Pertinent Imaging: None   Results/Tests Pending at Time of Discharge: None  Discharge Medications:  Allergies as of 09/30/2021       Reactions   Latex Itching, Swelling, Other (See Comments)   Causes skin irritation   Tape Other (See Comments)   Causes skin irritation   Shellfish Allergy Itching, Rash, Other (See Comments)   Reaction to scallops        Medication List     TAKE these medications    Accu-Chek Guide test strip Generic drug: glucose blood Use to check glucose 6 times daily   Accu-Chek Guide w/Device Kit Use 3 (three) times daily before meals.   Accu-Chek Softclix Lancets lancets Please use to check blood sugar up to 6 times/day.   acetaminophen 325 MG tablet Commonly known as: TYLENOL Take 2 tablets  (650 mg total) by mouth every 6 (six) hours as needed for mild pain (or Fever >/= 101).   albuterol 108 (90 Base) MCG/ACT inhaler Commonly known as: VENTOLIN HFA Inhale 2 puffs into the lungs every 6 (six) hours as needed for wheezing or shortness of breath.   Baqsimi One Pack 3 MG/DOSE Powd Generic drug: Glucagon Place 3 mg into the nose once as needed (low blood sugar).   blood glucose meter kit and supplies Use up to four times daily as directed. (Dispense based on patient and insurance preference. Use up to four times daily as directed. (FOR ICD-10 E10.9, E11.9).)   Dexcom G6 Sensor Misc Replace sensor every 10 days.   HumaLOG KwikPen 100 UNIT/ML KwikPen Generic drug: insulin lispro Inject 5-15 Units into the skin 3 times daily with meals- per sliding scale based on carb count. (See admin instructions.)   Lantus SoloStar 100 UNIT/ML Solostar Pen Generic drug: insulin glargine Inject 20 Units into the skin daily. What changed: how much to take   Pentips 32G X 4 MM Misc Generic drug: Insulin Pen Needle USE TO INJECT INSULIN 6 TIMES DAILY   PRENATAL PO Take 1 tablet by mouth daily.   sertraline 25 MG tablet Commonly known as: ZOLOFT Take 3 tablets (75 mg total) by mouth daily.   SUMAtriptan 25 MG tablet Commonly known as: Imitrex Take 0.5 tablets (12.5 mg total) by mouth every 2 (two) hours as needed for migraine. May repeat in 2 hours if headache persists or recurs.         Discharge Instructions: Please refer to Patient Instructions section of EMR for full details.  Patient was counseled important signs and symptoms that should prompt return to medical care, changes in medications, dietary instructions, activity restrictions, and follow up appointments.   Follow-Up Appointments:  Follow-up Information     Dameron, Luna Fuse, DO. Go on 10/08/2021.   Specialty: Family Medicine Why: _0 :44 Contact information: [place] [address] [phone]                  Erskine Emery, MD 09/30/2021, 7:02 PM PGY-1, [place]

## 2021-09-30 NOTE — Progress Notes (Signed)
     Daily Progress Note Intern Pager: [phone]  Patient name: Dawn [name] Medical record number: [id number] Date of birth: 07/29/2000 Age: 21 y.o. Gender: female  Primary Care Provider: Orvis Brill, DO Consultants: none Code Status: FULL  Pt Overview and Major Events to Date:  9/27: Admit to FMTS   Assessment and Plan: Dawn [name] is a 21 y.o. female presenting with DKA . Differential for this patient's presentation of this includes non compliance with home diabetes medication in the setting of type 1 diabetes.    * DKA (diabetic ketoacidosis) ([place]) Off endotool, AG closed x2. S/p 2x 10 mEq K. Transitioned to subcutaneous insulin and started on 10U basal and 3U mealtime. Per home meds, she was on 30 basal and 5-10U mealtime but patient reports she recently increased to 40 basal. Will monitor tolerating diet today. A1c pending.  - Monitor CBGs  - Dextrose 50 prn IV for low CBG - cardiac telemetry  - BMP in the AM - Strict I's and O's - will evaluate home medication regimen prior to discharge  Depression Patient compliant with Zoloft at home.  Her mood is stable. - Continue Zoloft   FEN/GI: carb modified diet  PPx: lovenox Dispo:Home today. Barriers include tolerate diet prior to discharge.   Subjective:  BP 132/96. Glucose 110s. Started on diet this AM but did not eat breakfast so CBG 40. Per nurse, she was alert and oriented and was given crackers and apple juice.   Patient reports she went to doctor's office and was told to come to hospital due to [place]. Patient denies any chest, abdominal pain. Reports she will try eating a diet. She wants to go home.   Objective: Temp:  [97.6 F (36.4 C)-98.2 F (36.8 C)] 97.6 F (36.4 C) (09/28 0354) Pulse Rate:  [72-104] 79 (09/28 0400) Resp:  [11-25] 14 (09/28 0400) BP: (101-137)/(75-100) 132/96 (09/28 0400) SpO2:  [98 %-100 %] 100 % (09/28 0400) Weight:  [49.9 kg] 49.9 kg (09/27 1524)  Physical Exam: General:  alert, well-appearing young African American adult in no acute distress Cardiovascular: RRR. No m/r/g. Respiratory: CTAB. Normal WOB on RA. Abdomen: Soft, non-tender, non-distended Extremities: No peripheral edema  Neuro: alert and answering questions appropriately  Laboratory: Most recent CBC Lab Results  Component Value Date   WBC 6.8 09/30/2021   HGB 11.0 (L) 09/30/2021   HCT 34.1 (L) 09/30/2021   MCV 89.3 09/30/2021   PLT 300 09/30/2021   Most recent BMP    Latest Ref Rng & Units 09/30/2021    5:45 AM  BMP  Glucose 70 - 99 mg/dL 125   BUN 6 - 20 mg/dL <5   Creatinine 0.44 - 1.00 mg/dL 0.61   Sodium 135 - 145 mmol/L 136   Potassium 3.5 - 5.1 mmol/L 3.7   Chloride 98 - 111 mmol/L 105   CO2 22 - 32 mmol/L 20   Calcium 8.9 - 10.3 mg/dL 9.0     Other pertinent labs  AG 17 > 9 > 12, BHB 7.56 > 2.37  Rolanda Lundborg, MD 09/30/2021, 7:14 AM  PGY-1, [place] Intern pager: [phone], text pages welcome Secure chat group [place]

## 2021-09-30 NOTE — ED Notes (Signed)
Admitting MD paged regarding insulin orders, will recheck CBG approx 1pm. Pt & admitting voiced understanding, agreeable w plan.

## 2021-09-30 NOTE — Discharge Instructions (Addendum)
Dear Dawn [name],   Thank you so much for allowing [place] to be part of your care!  You were admitted to [facility] for high blood sugars and symptoms related to this  We will continue with insulin to take, continue with 20 units of your basal (Lantus)and home meal time dosage. Contact your endocrinologist to increase this if you notice higher sugars.   We would like for you to stay hydrated as able and try to monitor sugar regularly.    POST-HOSPITAL & CARE INSTRUCTIONS Follow up with PCP 10-6 @9 :30  Please let PCP/Specialists know of any changes that were made.  Please see medications section of this packet for any medication changes.   DOCTOR'S APPOINTMENT & FOLLOW UP CARE INSTRUCTIONS  Future Appointments  Date Time Provider [place]  10/08/2021  9:30 AM Dameron, Luna Fuse, DO [facility]    RETURN PRECAUTIONS: - shortness of breath  - confusion, fever, chills  - swelling in the legs  - chest pain   Take care and be well!  [facility]  [place], [place] [address] [phone]

## 2021-09-30 NOTE — ED Notes (Signed)
C/o feeling shakey , CBG 41 apple juice and peanut butter crackers given. Patient didn't eat her breakfast because she didn't like it. Allowed patient to order her lunch.

## 2021-10-01 ENCOUNTER — Other Ambulatory Visit: Payer: Self-pay

## 2021-10-01 LAB — HEMOGLOBIN A1C
Hgb A1c MFr Bld: >15.5 % — ABNORMAL HIGH (ref 4.8–5.6)
Mean Plasma Glucose: >398 mg/dL

## 2021-10-05 ENCOUNTER — Emergency Department (HOSPITAL_COMMUNITY)
Admission: EM | Admit: 2021-10-05 | Discharge: 2021-10-05 | Disposition: A | Discharge status: Home / Self Care | Payer: Medicaid Other | Attending: Emergency Medicine | Admitting: Emergency Medicine

## 2021-10-05 ENCOUNTER — Encounter (HOSPITAL_COMMUNITY): Payer: Self-pay

## 2021-10-05 ENCOUNTER — Other Ambulatory Visit: Payer: Self-pay

## 2021-10-05 DIAGNOSIS — E1165 Type 2 diabetes mellitus with hyperglycemia: Secondary | ICD-10-CM | POA: Insufficient documentation

## 2021-10-05 DIAGNOSIS — Z91040 Latex allergy status: Secondary | ICD-10-CM | POA: Insufficient documentation

## 2021-10-05 DIAGNOSIS — R739 Hyperglycemia, unspecified: Secondary | ICD-10-CM | POA: Diagnosis present

## 2021-10-05 LAB — I-STAT CHEM 8, ED
BUN: 22 mg/dL — ABNORMAL HIGH (ref 6–20)
Calcium, Ion: 1.1 mmol/L — ABNORMAL LOW (ref 1.15–1.40)
Chloride: 99 mmol/L (ref 98–111)
Creatinine, Ser: 0.4 mg/dL — ABNORMAL LOW (ref 0.44–1.00)
Glucose, Bld: 615 mg/dL (ref 70–99)
HCT: 36 % (ref 36.0–46.0)
Hemoglobin: 12.2 g/dL (ref 12.0–15.0)
Potassium: 4.8 mmol/L (ref 3.5–5.1)
Sodium: 130 mmol/L — ABNORMAL LOW (ref 135–145)
TCO2: 24 mmol/L (ref 22–32)

## 2021-10-05 LAB — CBC WITH DIFFERENTIAL/PLATELET
Abs Immature Granulocytes: 0.02 10*3/uL (ref 0.00–0.07)
Basophils Absolute: 0 10*3/uL (ref 0.0–0.1)
Basophils Relative: 1 %
Eosinophils Absolute: 0.1 10*3/uL (ref 0.0–0.5)
Eosinophils Relative: 1 %
HCT: 34.7 % — ABNORMAL LOW (ref 36.0–46.0)
Hemoglobin: 10.9 g/dL — ABNORMAL LOW (ref 12.0–15.0)
Immature Granulocytes: 0 %
Lymphocytes Relative: 23 %
Lymphs Abs: 1.9 10*3/uL (ref 0.7–4.0)
MCH: 29.1 pg (ref 26.0–34.0)
MCHC: 31.4 g/dL (ref 30.0–36.0)
MCV: 92.5 fL (ref 80.0–100.0)
Monocytes Absolute: 1 10*3/uL (ref 0.1–1.0)
Monocytes Relative: 11 %
Neutro Abs: 5.3 10*3/uL (ref 1.7–7.7)
Neutrophils Relative %: 64 %
Platelets: 245 10*3/uL (ref 150–400)
RBC: 3.75 MIL/uL — ABNORMAL LOW (ref 3.87–5.11)
RDW: 14.3 % (ref 11.5–15.5)
WBC: 8.3 10*3/uL (ref 4.0–10.5)
nRBC: 0 % (ref 0.0–0.2)

## 2021-10-05 LAB — BLOOD GAS, VENOUS
Acid-Base Excess: 1.3 mmol/L (ref 0.0–2.0)
Bicarbonate: 25.9 mmol/L (ref 20.0–28.0)
O2 Saturation: 96.1 %
Patient temperature: 37
pCO2, Ven: 40 mmHg — ABNORMAL LOW (ref 44–60)
pH, Ven: 7.42 (ref 7.25–7.43)
pO2, Ven: 70 mmHg — ABNORMAL HIGH (ref 32–45)

## 2021-10-05 LAB — URINALYSIS, ROUTINE W REFLEX MICROSCOPIC
Bacteria, UA: NONE SEEN
Bilirubin Urine: NEGATIVE
Glucose, UA: >=500 mg/dL — AB
Ketones, ur: 20 mg/dL — AB
Leukocytes,Ua: NEGATIVE
Nitrite: NEGATIVE
Protein, ur: NEGATIVE mg/dL
Specific Gravity, Urine: 1.021 (ref 1.005–1.030)
pH: 7 (ref 5.0–8.0)

## 2021-10-05 LAB — COMPREHENSIVE METABOLIC PANEL
ALT: 22 U/L (ref 0–44)
AST: 25 U/L (ref 15–41)
Albumin: 3.2 g/dL — ABNORMAL LOW (ref 3.5–5.0)
Alkaline Phosphatase: 127 U/L — ABNORMAL HIGH (ref 38–126)
Anion gap: 11 (ref 5–15)
BUN: 23 mg/dL — ABNORMAL HIGH (ref 6–20)
CO2: 22 mmol/L (ref 22–32)
Calcium: 9.2 mg/dL (ref 8.9–10.3)
Chloride: 97 mmol/L — ABNORMAL LOW (ref 98–111)
Creatinine, Ser: 0.72 mg/dL (ref 0.44–1.00)
GFR, Estimated: >60 mL/min (ref >60)
Glucose, Bld: 571 mg/dL (ref 70–99)
Potassium: 4.7 mmol/L (ref 3.5–5.1)
Sodium: 130 mmol/L — ABNORMAL LOW (ref 135–145)
Total Bilirubin: 0.8 mg/dL (ref 0.3–1.2)
Total Protein: 7 g/dL (ref 6.5–8.1)

## 2021-10-05 MED ORDER — INSULIN ASPART 100 UNIT/ML IJ SOLN
10.0000 [IU] | Freq: Once | INTRAMUSCULAR | Status: AC
  Administered 2021-10-05: 10 [IU] via INTRAVENOUS
  Filled 2021-10-05: qty 0.1

## 2021-10-05 MED ORDER — OSELTAMIVIR PHOSPHATE 75 MG PO CAPS: 75.0000 mg | ORAL_CAPSULE | Freq: Two times a day (BID) | ORAL | 0 refills | Status: DC

## 2021-10-05 MED ORDER — OSELTAMIVIR PHOSPHATE 75 MG PO CAPS
75.0000 mg | ORAL_CAPSULE | Freq: Once | ORAL | Status: AC
  Administered 2021-10-05: 75 mg via ORAL
  Filled 2021-10-05: qty 1

## 2021-10-05 MED ORDER — SODIUM CHLORIDE 0.9 % IV BOLUS
1000.0000 mL | Freq: Once | INTRAVENOUS | Status: AC
  Administered 2021-10-05: 1000 mL via INTRAVENOUS

## 2021-10-05 NOTE — ED Triage Notes (Signed)
"  Discharged from the hospital 4 days ago after an admission related to DKA, states she started feeling achy around the time she was discharged, today she went to her PCP and tested positive for flu A and covid. Her blood glucose is also reading "high" on her monitor without numeric value. Her PCP recommended EMS transport to ED for evaluation of hyperglycemia" per EMS

## 2021-10-05 NOTE — ED Provider Notes (Signed)
[place] [facility] DEPT Provider Note   CSN: [id number] Arrival date & time: 10/05/21  1643     History  Chief Complaint  Patient presents with   Hyperglycemia    Dawn [name] is a 21 y.o. female.  HPI Patient presents 4 days after being discharged from our affiliated facility following an admission for DKA, now with concern for hyperglycemia as well as new COVID and influenza positive results.  Patient denies focal pain, states that she feels her hypoglycemia secondary to infection, as she denies nausea, vomiting, particular discomfort.  She has been taking her medication as directed, but notes that glucose levels have been increasing since discharge.  With mild fatigue, weakness she was tested for COVID, influenza, and had positive results earlier today.  She was encouraged by her primary care physician to come for evaluation.    Home Medications Prior to Admission medications   Medication Sig Start Date End Date Taking? Authorizing Provider  oseltamivir (TAMIFLU) 75 MG capsule Take 1 capsule (75 mg total) by mouth every 12 (twelve) hours. 10/05/21  Yes Carmin Muskrat, MD  Accu-Chek Softclix Lancets lancets Please use to check blood sugar up to 6 times/day. 08/05/21   August Albino, MD  acetaminophen (TYLENOL) 325 MG tablet Take 2 tablets (650 mg total) by mouth every 6 (six) hours as needed for mild pain (or Fever >/= 101). 09/02/21   Sharion Settler, DO  albuterol (VENTOLIN HFA) 108 (90 Base) MCG/ACT inhaler Inhale 2 puffs into the lungs every 6 (six) hours as needed for wheezing or shortness of breath. 03/24/21   [name], MD  blood glucose meter kit and supplies Dispense based on patient and insurance preference. Use up to four times daily as directed. (FOR ICD-10 E10.9, E11.9). 08/05/21   August Albino, MD  Blood Glucose Monitoring Suppl (ACCU-CHEK GUIDE ME) w/Device KIT Use 3 (three) times daily before meals. 08/05/21   August Albino, MD  Continuous  Blood Gluc Sensor (DEXCOM G6 SENSOR) MISC Replace sensor every 10 days. 09/02/21   Sharion Settler, DO  Glucagon (BAQSIMI ONE PACK) 3 MG/DOSE POWD Place 3 mg into the nose once as needed (low blood sugar). Patient not taking: Reported on 08/05/2021    [provider]  glucose blood (ACCU-CHEK GUIDE) test strip Use to check glucose 6 times daily 08/05/21   August Albino, MD  insulin glargine (LANTUS SOLOSTAR) 100 UNIT/ML Solostar Pen Inject 20 Units into the skin daily. 09/30/21   Erskine Emery, MD  insulin lispro (HUMALOG KWIKPEN) 100 UNIT/ML KwikPen Inject 5-15 Units into the skin 3 times daily with meals- per sliding scale based on carb count. (See admin instructions.) 08/05/21 09/29/21  August Albino, MD  Insulin Pen Needle (BD PEN NEEDLE NANO 2ND GEN) 32G X 4 MM MISC USE TO INJECT INSULIN 6 TIMES DAILY 08/05/21   August Albino, MD  Prenatal Vit-Fe Fumarate-FA (PRENATAL PO) Take 1 tablet by mouth daily.    [provider]  sertraline (ZOLOFT) 25 MG tablet Take 3 tablets (75 mg total) by mouth daily. 09/03/21   Sharion Settler, DO  SUMAtriptan (IMITREX) 25 MG tablet Take 0.5 tablets (12.5 mg total) by mouth every 2 (two) hours as needed for migraine. May repeat in 2 hours if headache persists or recurs. 04/12/21   [name], MD      Allergies    Latex, Tape, and Shellfish allergy    Review of Systems   Review of Systems  All other systems reviewed and are negative.  Physical Exam Updated Vital Signs BP 126/87   Pulse 93   Temp 98.4 F (36.9 C) (Oral)   Resp 16   Ht 5' 2" (1.575 m)   Wt 49.9 kg   SpO2 100%   BMI 20.12 kg/m  Physical Exam Vitals and nursing note reviewed.  Constitutional:      General: She is not in acute distress.    Appearance: She is well-developed.  HENT:     Head: Normocephalic and atraumatic.  Eyes:     Conjunctiva/sclera: Conjunctivae normal.  Cardiovascular:     Rate and Rhythm: Normal rate and regular rhythm.  Pulmonary:      Effort: Pulmonary effort is normal. No respiratory distress.     Breath sounds: Normal breath sounds. No stridor.  Abdominal:     General: There is no distension.  Skin:    General: Skin is warm and dry.  Neurological:     Mental Status: She is alert and oriented to person, place, and time.     Cranial Nerves: No cranial nerve deficit.  Psychiatric:        Mood and Affect: Mood normal.     ED Results / Procedures / Treatments   Labs (all labs ordered are listed, but only abnormal results are displayed) Labs Reviewed  COMPREHENSIVE METABOLIC PANEL - Abnormal; Notable for the following components:      Result Value   Sodium 130 (*)    Chloride 97 (*)    Glucose, Bld 571 (*)    BUN 23 (*)    Albumin 3.2 (*)    Alkaline Phosphatase 127 (*)    All other components within normal limits  CBC WITH DIFFERENTIAL/PLATELET - Abnormal; Notable for the following components:   RBC 3.75 (*)    Hemoglobin 10.9 (*)    HCT 34.7 (*)    All other components within normal limits  URINALYSIS, ROUTINE W REFLEX MICROSCOPIC - Abnormal; Notable for the following components:   Color, Urine COLORLESS (*)    Glucose, UA >=500 (*)    Hgb urine dipstick MODERATE (*)    Ketones, ur 20 (*)    All other components within normal limits  BLOOD GAS, VENOUS - Abnormal; Notable for the following components:   pCO2, Ven 40 (*)    pO2, Ven 70 (*)    All other components within normal limits  I-STAT CHEM 8, ED - Abnormal; Notable for the following components:   Sodium 130 (*)    BUN 22 (*)    Creatinine, Ser 0.40 (*)    Glucose, Bld 615 (*)    Calcium, Ion 1.10 (*)    All other components within normal limits    EKG None  Radiology No results found.  Procedures Procedures    Medications Ordered in ED Medications  oseltamivir (TAMIFLU) capsule 75 mg (has no administration in time range)  sodium chloride 0.9 % bolus 1,000 mL (1,000 mLs Intravenous New Bag/Given 10/05/21 2050)  insulin aspart  (novoLOG) injection 10 Units (10 Units Intravenous Given 10/05/21 2131)    ED Course/ Medical Decision Making/ A&P This patient with a Hx of insulin-dependent diabetes, recent admission for DKA presents to the ED for concern of diagnosis of COVID, influenza, hyperglycemia, this involves an extensive number of treatment options, and is a complaint that carries with it a high risk of complications and morbidity.    The differential diagnosis includes bacteremia, sepsis, DKA, dehydration   Social Determinants of Health:  No limiting factors  Additional   history obtained:  Additional history and/or information obtained from chart review, notable for discharge summary from admission for DKA few days ago, patient essentially normoglycemic at that point   After the initial evaluation, orders, including: Empiric fluids, monitoring, labs, urinalysis were initiated.   Patient placed on Cardiac and Pulse-Oximetry Monitors. The patient was maintained on a cardiac monitor.  The cardiac monitored showed an rhythm of 90 sinus normal The patient was also maintained on pulse oximetry. The readings were typically 100% room air normal   On repeat evaluation of the patient improved  10:42 PM Patient awake, alert, on a cellular telephone no complaints, ready for discharge, aware of all findings   Lab Tests:  I personally interpreted labs.  The pertinent results include: Hyperglycemia, no evidence for anion gap, no acidosis, pH 7.42   After the initial results were available with concern for hyperglycemia, no evidence for DKA the patient received IV fluids, insulin, glucose trended down, and she remained essentially asymptomatic including with obvious stigmata of COVID/influenza. Given her history of influenza, COVID, we discussed options for medication to speed the course of recovery and she will start Tamiflu, was encouraged to follow-up with primary care and to adjust her insulin dosing as needed.   Absent evidence for DKA, distress, bacteremia, sepsis, though hospitalization is a consideration given her recent complications of diabetes, patient discharged in stable condition.    Final Clinical Impression(s) / ED Diagnoses Final diagnoses:  Hyperglycemia    Rx / DC Orders ED Discharge Orders          Ordered    oseltamivir (TAMIFLU) 75 MG capsule  Every 12 hours        10/05/21 2239              Carmin Muskrat, MD 10/05/21 2243

## 2021-10-05 NOTE — ED Notes (Signed)
IV attempt unsuccessful x 2. Patient in no obvious distress. VSS.

## 2021-10-05 NOTE — Discharge Instructions (Addendum)
As discussed, it is important that you follow-up with your physician.  You have been prescribed a medication that should help you recover from your influenza illness more quickly.  However, this medication does not resolve symptoms immediately.  Your blood sugar is elevated, but there is currently no evidence for diabetic ketoacidosis.  Please monitor your glucose levels for the next few days and adjust your insulin dosing as needed.  Return here for concerning changes in your condition.

## 2021-10-08 ENCOUNTER — Ambulatory Visit: Payer: Self-pay | Admitting: Student

## 2021-10-14 ENCOUNTER — Ambulatory Visit
Admission: EM | Admit: 2021-10-14 | Discharge: 2021-10-14 | Disposition: A | Discharge status: Home / Self Care | Payer: Medicaid Other | Attending: Family Medicine | Admitting: Family Medicine

## 2021-10-14 ENCOUNTER — Encounter: Payer: Self-pay | Admitting: Emergency Medicine

## 2021-10-14 DIAGNOSIS — N926 Irregular menstruation, unspecified: Secondary | ICD-10-CM | POA: Diagnosis not present

## 2021-10-14 DIAGNOSIS — E1065 Type 1 diabetes mellitus with hyperglycemia: Secondary | ICD-10-CM

## 2021-10-14 LAB — POCT URINALYSIS DIP (MANUAL ENTRY)
Bilirubin, UA: NEGATIVE
Glucose, UA: 500 mg/dL — AB
Leukocytes, UA: NEGATIVE
Nitrite, UA: NEGATIVE
Protein Ur, POC: NEGATIVE mg/dL
Spec Grav, UA: 1.015 (ref 1.010–1.025)
Urobilinogen, UA: 0.2 E.U./dL
pH, UA: 5.5 (ref 5.0–8.0)

## 2021-10-14 LAB — POCT FASTING CBG KUC MANUAL ENTRY: POCT Glucose (KUC): 428 mg/dL — AB (ref 70–99)

## 2021-10-14 LAB — POCT URINE PREGNANCY: Preg Test, Ur: NEGATIVE
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
## 2021-10-14 NOTE — ED Triage Notes (Signed)
Pt is present today with abdominal pain, fatigue, urinary frequency, and vomiting. Pt sx started x2 weeks ago

## 2021-10-14 NOTE — Discharge Instructions (Signed)
You have had labs (blood pregnancy test) sent today. We will call you with any significant abnormalities or if there is need to begin or change treatment or pursue further follow up.  You may also review your test results online through MyChart. If you do not have a MyChart account, instructions to sign up should be on your discharge paperwork.  

## 2021-10-15 LAB — BETA HCG QUANT (REF LAB): hCG Quant: <1 m[IU]/mL

## 2021-10-17 ENCOUNTER — Observation Stay (HOSPITAL_COMMUNITY): Payer: Medicaid Other

## 2021-10-17 ENCOUNTER — Emergency Department (HOSPITAL_COMMUNITY): Payer: Medicaid Other

## 2021-10-17 ENCOUNTER — Inpatient Hospital Stay (HOSPITAL_COMMUNITY)
Admission: EM | Admit: 2021-10-17 | Discharge: 2021-10-19 | DRG: 638 | Disposition: A | Discharge status: Home / Self Care | Payer: Medicaid Other | Attending: Internal Medicine | Admitting: Internal Medicine

## 2021-10-17 ENCOUNTER — Other Ambulatory Visit: Payer: Self-pay

## 2021-10-17 DIAGNOSIS — E1010 Type 1 diabetes mellitus with ketoacidosis without coma: Secondary | ICD-10-CM | POA: Diagnosis not present

## 2021-10-17 DIAGNOSIS — Z91048 Other nonmedicinal substance allergy status: Secondary | ICD-10-CM

## 2021-10-17 DIAGNOSIS — D72829 Elevated white blood cell count, unspecified: Secondary | ICD-10-CM

## 2021-10-17 DIAGNOSIS — Z833 Family history of diabetes mellitus: Secondary | ICD-10-CM

## 2021-10-17 DIAGNOSIS — Z79899 Other long term (current) drug therapy: Secondary | ICD-10-CM

## 2021-10-17 DIAGNOSIS — Z91013 Allergy to seafood: Secondary | ICD-10-CM

## 2021-10-17 DIAGNOSIS — F909 Attention-deficit hyperactivity disorder, unspecified type: Secondary | ICD-10-CM | POA: Diagnosis present

## 2021-10-17 DIAGNOSIS — R112 Nausea with vomiting, unspecified: Secondary | ICD-10-CM | POA: Diagnosis present

## 2021-10-17 DIAGNOSIS — E860 Dehydration: Secondary | ICD-10-CM | POA: Diagnosis present

## 2021-10-17 DIAGNOSIS — J45909 Unspecified asthma, uncomplicated: Secondary | ICD-10-CM | POA: Diagnosis present

## 2021-10-17 DIAGNOSIS — Z91148 Patient's other noncompliance with medication regimen for other reason: Secondary | ICD-10-CM

## 2021-10-17 DIAGNOSIS — Z8249 Family history of ischemic heart disease and other diseases of the circulatory system: Secondary | ICD-10-CM

## 2021-10-17 DIAGNOSIS — N179 Acute kidney failure, unspecified: Secondary | ICD-10-CM

## 2021-10-17 DIAGNOSIS — Z794 Long term (current) use of insulin: Secondary | ICD-10-CM

## 2021-10-17 DIAGNOSIS — F32A Depression, unspecified: Secondary | ICD-10-CM | POA: Diagnosis present

## 2021-10-17 DIAGNOSIS — R7989 Other specified abnormal findings of blood chemistry: Secondary | ICD-10-CM | POA: Diagnosis present

## 2021-10-17 DIAGNOSIS — E1110 Type 2 diabetes mellitus with ketoacidosis without coma: Secondary | ICD-10-CM | POA: Diagnosis present

## 2021-10-17 DIAGNOSIS — Z91040 Latex allergy status: Secondary | ICD-10-CM

## 2021-10-17 DIAGNOSIS — Z20822 Contact with and (suspected) exposure to covid-19: Secondary | ICD-10-CM | POA: Diagnosis present

## 2021-10-17 DIAGNOSIS — F4321 Adjustment disorder with depressed mood: Secondary | ICD-10-CM | POA: Diagnosis present

## 2021-10-17 DIAGNOSIS — Z825 Family history of asthma and other chronic lower respiratory diseases: Secondary | ICD-10-CM

## 2021-10-17 LAB — RESP PANEL BY RT-PCR (FLU A&B, COVID) ARPGX2
Influenza A by PCR: NEGATIVE
Influenza B by PCR: NEGATIVE
SARS Coronavirus 2 by RT PCR: NEGATIVE

## 2021-10-17 LAB — BLOOD GAS, VENOUS
Acid-base deficit: 28 mmol/L — ABNORMAL HIGH (ref 0.0–2.0)
Bicarbonate: 5.6 mmol/L — ABNORMAL LOW (ref 20.0–28.0)
O2 Saturation: 52.3 %
Patient temperature: 37
pCO2, Ven: 33 mmHg — ABNORMAL LOW (ref 44–60)
pH, Ven: <6.95 — CL (ref 7.25–7.43)
pO2, Ven: 42 mmHg (ref 32–45)

## 2021-10-17 LAB — COMPREHENSIVE METABOLIC PANEL
ALT: 32 U/L (ref 0–44)
AST: 37 U/L (ref 15–41)
Albumin: 4.7 g/dL (ref 3.5–5.0)
Alkaline Phosphatase: 190 U/L — ABNORMAL HIGH (ref 38–126)
BUN: 29 mg/dL — ABNORMAL HIGH (ref 6–20)
CO2: <7 mmol/L — ABNORMAL LOW (ref 22–32)
Calcium: 10.6 mg/dL — ABNORMAL HIGH (ref 8.9–10.3)
Chloride: 100 mmol/L (ref 98–111)
Creatinine, Ser: 1.51 mg/dL — ABNORMAL HIGH (ref 0.44–1.00)
GFR, Estimated: 50 mL/min — ABNORMAL LOW (ref >60)
Glucose, Bld: 859 mg/dL (ref 70–99)
Potassium: 5.3 mmol/L — ABNORMAL HIGH (ref 3.5–5.1)
Sodium: 136 mmol/L (ref 135–145)
Total Bilirubin: 2 mg/dL — ABNORMAL HIGH (ref 0.3–1.2)
Total Protein: 10.5 g/dL — ABNORMAL HIGH (ref 6.5–8.1)

## 2021-10-17 LAB — I-STAT CHEM 8, ED
BUN: 40 mg/dL — ABNORMAL HIGH (ref 6–20)
Calcium, Ion: 1.32 mmol/L (ref 1.15–1.40)
Chloride: 110 mmol/L (ref 98–111)
Creatinine, Ser: 0.8 mg/dL (ref 0.44–1.00)
Glucose, Bld: >700 mg/dL (ref 70–99)
HCT: 53 % — ABNORMAL HIGH (ref 36.0–46.0)
Hemoglobin: 18 g/dL — ABNORMAL HIGH (ref 12.0–15.0)
Potassium: 5.5 mmol/L — ABNORMAL HIGH (ref 3.5–5.1)
Sodium: 137 mmol/L (ref 135–145)
TCO2: 9 mmol/L — ABNORMAL LOW (ref 22–32)

## 2021-10-17 LAB — URINALYSIS, ROUTINE W REFLEX MICROSCOPIC
Bacteria, UA: NONE SEEN
Bilirubin Urine: NEGATIVE
Glucose, UA: >=500 mg/dL — AB
Ketones, ur: 80 mg/dL — AB
Leukocytes,Ua: NEGATIVE
Nitrite: NEGATIVE
Protein, ur: 30 mg/dL — AB
Specific Gravity, Urine: 1.02 (ref 1.005–1.030)
pH: 5 (ref 5.0–8.0)

## 2021-10-17 LAB — CBG MONITORING, ED
Glucose-Capillary: >600 mg/dL (ref 70–99)
Glucose-Capillary: >600 mg/dL (ref 70–99)
Glucose-Capillary: >600 mg/dL (ref 70–99)
Glucose-Capillary: 498 mg/dL — ABNORMAL HIGH (ref 70–99)
Glucose-Capillary: >600 mg/dL (ref 70–99)
Glucose-Capillary: 382 mg/dL — ABNORMAL HIGH (ref 70–99)
Glucose-Capillary: 313 mg/dL — ABNORMAL HIGH (ref 70–99)
Glucose-Capillary: 243 mg/dL — ABNORMAL HIGH (ref 70–99)

## 2021-10-17 LAB — CBC
HCT: 51.2 % — ABNORMAL HIGH (ref 36.0–46.0)
Hemoglobin: 14.6 g/dL (ref 12.0–15.0)
MCH: 28.8 pg (ref 26.0–34.0)
MCHC: 28.5 g/dL — ABNORMAL LOW (ref 30.0–36.0)
MCV: 101 fL — ABNORMAL HIGH (ref 80.0–100.0)
Platelets: 221 10*3/uL (ref 150–400)
RBC: 5.07 MIL/uL (ref 3.87–5.11)
RDW: 14.8 % (ref 11.5–15.5)
WBC: 16 10*3/uL — ABNORMAL HIGH (ref 4.0–10.5)
nRBC: 0 % (ref 0.0–0.2)

## 2021-10-17 LAB — BASIC METABOLIC PANEL
Anion gap: 21 — ABNORMAL HIGH (ref 5–15)
BUN: 26 mg/dL — ABNORMAL HIGH (ref 6–20)
CO2: 8 mmol/L — ABNORMAL LOW (ref 22–32)
Calcium: 9.9 mg/dL (ref 8.9–10.3)
Chloride: 111 mmol/L (ref 98–111)
Creatinine, Ser: 1.15 mg/dL — ABNORMAL HIGH (ref 0.44–1.00)
GFR, Estimated: >60 mL/min (ref >60)
Glucose, Bld: 304 mg/dL — ABNORMAL HIGH (ref 70–99)
Potassium: 4.6 mmol/L (ref 3.5–5.1)
Sodium: 140 mmol/L (ref 135–145)

## 2021-10-17 LAB — I-STAT BETA HCG BLOOD, ED (MC, WL, AP ONLY): I-stat hCG, quantitative: <5 m[IU]/mL (ref <5)

## 2021-10-17 LAB — BLOOD GAS, ARTERIAL
Acid-base deficit: 24.5 mmol/L — ABNORMAL HIGH (ref 0.0–2.0)
Bicarbonate: 2.5 mmol/L — ABNORMAL LOW (ref 20.0–28.0)
FIO2: 21 %
O2 Saturation: 99.6 %
Patient temperature: 37
pCO2 arterial: <18 mmHg — CL (ref 32–48)
pH, Arterial: 7.11 — CL (ref 7.35–7.45)
pO2, Arterial: 147 mmHg — ABNORMAL HIGH (ref 83–108)

## 2021-10-17 LAB — MRSA NEXT GEN BY PCR, NASAL: MRSA by PCR Next Gen: NOT DETECTED

## 2021-10-17 LAB — BETA-HYDROXYBUTYRIC ACID: Beta-Hydroxybutyric Acid: 7.82 mmol/L — ABNORMAL HIGH (ref 0.05–0.27)

## 2021-10-17 MED ORDER — DEXTROSE IN LACTATED RINGERS 5 % IV SOLN: INTRAVENOUS | Status: DC

## 2021-10-17 MED ORDER — LACTATED RINGERS IV BOLUS
1000.0000 mL | Freq: Once | INTRAVENOUS | Status: AC
  Administered 2021-10-17: 1000 mL via INTRAVENOUS

## 2021-10-17 MED ORDER — INSULIN REGULAR(HUMAN) IN NACL 100-0.9 UT/100ML-% IV SOLN
INTRAVENOUS | Status: DC
  Administered 2021-10-17: 8 [IU]/h via INTRAVENOUS
  Filled 2021-10-17: qty 100

## 2021-10-17 MED ORDER — ACETAMINOPHEN 650 MG RE SUPP: 650.0000 mg | Freq: Four times a day (QID) | RECTAL | Status: DC | PRN

## 2021-10-17 MED ORDER — ACETAMINOPHEN 325 MG PO TABS
650.0000 mg | ORAL_TABLET | Freq: Four times a day (QID) | ORAL | Status: DC | PRN
  Administered 2021-10-17: 650 mg via ORAL
  Filled 2021-10-17: qty 2

## 2021-10-17 MED ORDER — ACETAMINOPHEN 325 MG PO TABS: 650.0000 mg | ORAL_TABLET | Freq: Once | ORAL | Status: DC

## 2021-10-17 MED ORDER — CHLORHEXIDINE GLUCONATE CLOTH 2 % EX PADS
6.0000 | MEDICATED_PAD | Freq: Every day | CUTANEOUS | Status: DC
  Administered 2021-10-18: 6 via TOPICAL

## 2021-10-17 MED ORDER — LACTATED RINGERS IV SOLN: INTRAVENOUS | Status: DC

## 2021-10-17 MED ORDER — ENOXAPARIN SODIUM 40 MG/0.4ML IJ SOSY
40.0000 mg | PREFILLED_SYRINGE | INTRAMUSCULAR | Status: DC
  Administered 2021-10-17: 40 mg via SUBCUTANEOUS
  Filled 2021-10-17 (×2): qty 0.4

## 2021-10-17 MED ORDER — ONDANSETRON HCL 4 MG/2ML IJ SOLN: 4.0000 mg | Freq: Four times a day (QID) | INTRAMUSCULAR | Status: DC | PRN

## 2021-10-17 MED ORDER — DEXTROSE 50 % IV SOLN: 0.0000 mL | INTRAVENOUS | Status: DC | PRN

## 2021-10-17 MED ORDER — ONDANSETRON HCL 4 MG/2ML IJ SOLN
4.0000 mg | Freq: Once | INTRAMUSCULAR | Status: AC
  Administered 2021-10-17: 4 mg via INTRAVENOUS
  Filled 2021-10-17: qty 2

## 2021-10-17 MED ORDER — METOCLOPRAMIDE HCL 5 MG/ML IJ SOLN: 10.0000 mg | Freq: Once | INTRAMUSCULAR | Status: DC

## 2021-10-17 MED ORDER — ORAL CARE MOUTH RINSE: 15.0000 mL | OROMUCOSAL | Status: DC | PRN

## 2021-10-17 NOTE — ED Triage Notes (Signed)
Pt via EMS from home c/o hyperglycemia and kussmaul respirations. Unclear if type 1 or 2 but pt has been diabetic since childhood. CBG 510 per EMS. Pt appears uncomfortable in triage and is breathing very heavily with n/v, generalized pain since last night.   BP 130/84 HR 120 CBG 510  ST on EKG. Pt refused IV en route.
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
## 2021-10-17 NOTE — ED Provider Triage Note (Signed)
Emergency Medicine Provider Triage Evaluation Note  Dawn [name] , a 21 y.o. female  was evaluated in triage.  Patient complains of hyperglycemia.  Few small breathing in triage.  Seems slightly confused.  Will be roomed at this time.  Glucose reading high.  Does report she has type I and has been taking her insulin.    Rhae Hammock, PA-C 10/17/21 1616

## 2021-10-17 NOTE — ED Notes (Signed)
Pt care taken adjusted the insulin drip, assisted with bedpan, patient resting at this time, family at bedside.

## 2021-10-17 NOTE — ED Provider Notes (Signed)
[facility] DEPT Provider Note   CSN: [id number] Arrival date & time: 10/17/21  1603     History  Chief Complaint  Patient presents with   Hyperglycemia    Dawn [name] is a 21 y.o. female.   Hyperglycemia Associated symptoms: fatigue, nausea, shortness of breath, vomiting and weakness (Generalized)   Patient presents for hyperglycemia.  Her medical history includes T1DM, asthma, migraine headaches, depression.  She has had multiple prior admissions for DKA.  These occur 1-2 times per month.  Most recently was 2 weeks ago.  Patient's friend reports that patient seemed to be in her normal state of health last night over FaceTime.  Patient reports that she has felt unwell for the past 2 weeks.  Her symptoms have included nausea, vomiting, fatigue, and shortness of breath.  They worsened today.  Per chart review, she was seen at urgent care 3 days ago for similar symptoms.  At that time, she was concerned of pregnancy.  Pregnancy test was negative but patient's other lab work did show hyperglycemia and ketonuria.  Patient is unaware of when she last received insulin.     Home Medications Prior to Admission medications   Medication Sig Start Date End Date Taking? Authorizing Provider  Accu-Chek Softclix Lancets lancets Please use to check blood sugar up to 6 times/day. 08/05/21   August Albino, MD  acetaminophen (TYLENOL) 325 MG tablet Take 2 tablets (650 mg total) by mouth every 6 (six) hours as needed for mild pain (or Fever >/= 101). 09/02/21   Sharion Settler, DO  albuterol (VENTOLIN HFA) 108 (90 Base) MCG/ACT inhaler Inhale 2 puffs into the lungs every 6 (six) hours as needed for wheezing or shortness of breath. 03/24/21   [name], MD  blood glucose meter kit and supplies Dispense based on patient and insurance preference. Use up to four times daily as directed. (FOR ICD-10 E10.9, E11.9). 08/05/21   August Albino, MD  Blood Glucose Monitoring Suppl  (ACCU-CHEK GUIDE ME) w/Device KIT Use 3 (three) times daily before meals. 08/05/21   August Albino, MD  Continuous Blood Gluc Sensor (DEXCOM G6 SENSOR) MISC Replace sensor every 10 days. 09/02/21   Sharion Settler, DO  Glucagon (BAQSIMI ONE PACK) 3 MG/DOSE POWD Place 3 mg into the nose once as needed (low blood sugar). Patient not taking: Reported on 08/05/2021    [provider]  glucose blood (ACCU-CHEK GUIDE) test strip Use to check glucose 6 times daily 08/05/21   August Albino, MD  insulin glargine (LANTUS SOLOSTAR) 100 UNIT/ML Solostar Pen Inject 20 Units into the skin daily. 09/30/21   Erskine Emery, MD  insulin lispro (HUMALOG KWIKPEN) 100 UNIT/ML KwikPen Inject 5-15 Units into the skin 3 times daily with meals- per sliding scale based on carb count. (See admin instructions.) 08/05/21 09/29/21  August Albino, MD  Insulin Pen Needle (BD PEN NEEDLE NANO 2ND GEN) 32G X 4 MM MISC USE TO INJECT INSULIN 6 TIMES DAILY 08/05/21   August Albino, MD  Prenatal Vit-Fe Fumarate-FA (PRENATAL PO) Take 1 tablet by mouth daily.    [provider]  sertraline (ZOLOFT) 25 MG tablet Take 3 tablets (75 mg total) by mouth daily. 09/03/21   Sharion Settler, DO  SUMAtriptan (IMITREX) 25 MG tablet Take 0.5 tablets (12.5 mg total) by mouth every 2 (two) hours as needed for migraine. May repeat in 2 hours if headache persists or recurs. 04/12/21   [name], MD      Allergies  Latex, Tape, and Shellfish allergy    Review of Systems   Review of Systems  Constitutional:  Positive for fatigue.  Respiratory:  Positive for shortness of breath.   Gastrointestinal:  Positive for nausea and vomiting.  Neurological:  Positive for weakness (Generalized).  All other systems reviewed and are negative.   Physical Exam Updated Vital Signs BP 128/83   Pulse (!) 121   Temp 97.6 F (36.4 C) (Oral)   Resp (!) 22   Ht _0  (1.575 m)   Wt 53.5 kg   LMP 09/06/2021 (Approximate)   SpO2 100%   BMI 21.58  kg/m  Physical Exam Vitals and nursing note reviewed.  Constitutional:      General: She is not in acute distress.    Appearance: She is well-developed. She is ill-appearing. She is not toxic-appearing or diaphoretic.  HENT:     Head: Normocephalic and atraumatic.     Right Ear: External ear normal.     Left Ear: External ear normal.     Nose: Nose normal.     Mouth/Throat:     Mouth: Mucous membranes are moist.     Pharynx: Oropharynx is clear.  Eyes:     Extraocular Movements: Extraocular movements intact.     Conjunctiva/sclera: Conjunctivae normal.  Cardiovascular:     Rate and Rhythm: Regular rhythm. Tachycardia present.     Heart sounds: No murmur heard. Pulmonary:     Effort: Pulmonary effort is normal. No respiratory distress.     Breath sounds: Normal breath sounds. No wheezing or rales.  Chest:     Chest wall: No tenderness.  Abdominal:     Palpations: Abdomen is soft.     Tenderness: There is no abdominal tenderness.  Musculoskeletal:        General: No swelling. Normal range of motion.     Cervical back: Normal range of motion and neck supple.     Right lower leg: No edema.     Left lower leg: No edema.  Skin:    General: Skin is warm and dry.     Capillary Refill: Capillary refill takes less than 2 seconds.     Coloration: Skin is not jaundiced or pale.  Neurological:     General: No focal deficit present.     Mental Status: She is alert and oriented to person, place, and time.     Cranial Nerves: No cranial nerve deficit.     Sensory: No sensory deficit.     Motor: No weakness.     Coordination: Coordination normal.  Psychiatric:        Mood and Affect: Mood is anxious.        Behavior: Behavior normal.        Thought Content: Thought content normal.        Judgment: Judgment normal.     ED Results / Procedures / Treatments   Labs (all labs ordered are listed, but only abnormal results are displayed) Labs Reviewed  CBC - Abnormal; Notable for the  following components:      Result Value   WBC 16.0 (*)    HCT 51.2 (*)    MCV 101.0 (*)    MCHC 28.5 (*)    All other components within normal limits  URINALYSIS, ROUTINE W REFLEX MICROSCOPIC - Abnormal; Notable for the following components:   Color, Urine STRAW (*)    Glucose, UA >=500 (*)    Hgb urine dipstick MODERATE (*)    Ketones,  ur 80 (*)    Protein, ur 30 (*)    All other components within normal limits  BLOOD GAS, VENOUS - Abnormal; Notable for the following components:   pH, Ven <6.95 (*)    pCO2, Ven 33 (*)    Bicarbonate 5.6 (*)    Acid-base deficit 28.0 (*)    All other components within normal limits  COMPREHENSIVE METABOLIC PANEL - Abnormal; Notable for the following components:   Potassium 5.3 (*)    CO2 <7 (*)    Glucose, Bld 859 (*)    BUN 29 (*)    Creatinine, Ser 1.51 (*)    Calcium 10.6 (*)    Total Protein 10.5 (*)    Alkaline Phosphatase 190 (*)    Total Bilirubin 2.0 (*)    GFR, Estimated 50 (*)    All other components within normal limits  BLOOD GAS, ARTERIAL - Abnormal; Notable for the following components:   pH, Arterial 7.11 (*)    pCO2 arterial <18 (*)    pO2, Arterial 147 (*)    Bicarbonate 2.5 (*)    Acid-base deficit 24.5 (*)    All other components within normal limits  CBG MONITORING, ED - Abnormal; Notable for the following components:   Glucose-Capillary >600 (*)    All other components within normal limits  I-STAT CHEM 8, ED - Abnormal; Notable for the following components:   Potassium 5.5 (*)    BUN 40 (*)    Glucose, Bld >700 (*)    TCO2 9 (*)    Hemoglobin 18.0 (*)    HCT 53.0 (*)    All other components within normal limits  CBG MONITORING, ED - Abnormal; Notable for the following components:   Glucose-Capillary >600 (*)    All other components within normal limits  CBG MONITORING, ED - Abnormal; Notable for the following components:   Glucose-Capillary >600 (*)    All other components within normal limits  CBG  MONITORING, ED - Abnormal; Notable for the following components:   Glucose-Capillary >600 (*)    All other components within normal limits  CBG MONITORING, ED - Abnormal; Notable for the following components:   Glucose-Capillary 498 (*)    All other components within normal limits  CBG MONITORING, ED - Abnormal; Notable for the following components:   Glucose-Capillary 382 (*)    All other components within normal limits  RESP PANEL BY RT-PCR (FLU A&B, COVID) ARPGX2  MRSA NEXT GEN BY PCR, NASAL  BETA-HYDROXYBUTYRIC ACID  BASIC METABOLIC PANEL  BASIC METABOLIC PANEL  BASIC METABOLIC PANEL  BASIC METABOLIC PANEL  OSMOLALITY  I-STAT BETA HCG BLOOD, ED ([facility], [facility], [facility] ONLY)    EKG EKG Interpretation  Date/Time:  Sunday October 17 2021 16:33:13 EDT Ventricular Rate:  116 PR Interval:  121 QRS Duration: 85 QT Interval:  329 QTC Calculation: 457 R Axis:   65 Text Interpretation: Sinus tachycardia Biatrial enlargement Confirmed by Godfrey Pick ([id number]) on 10/17/2021 4:42:25 PM  Radiology DG Chest Portable 1 View  Result Date: 10/17/2021 CLINICAL DATA:  DKA EXAM: PORTABLE CHEST 1 VIEW COMPARISON:  Chest x-ray 04/14/2021 FINDINGS: The heart size and mediastinal contours are within normal limits. Both lungs are clear. The visualized skeletal structures are unremarkable. IMPRESSION: No active disease. Electronically Signed   By: Ronney Asters M.D.   On: 10/17/2021 19:15    Procedures Procedures    Medications Ordered in ED Medications  insulin regular, human (MYXREDLIN) 100 units/ 100 mL infusion (3.2 Units/hr  Intravenous Rate/Dose Change 10/17/21 2004)  lactated ringers infusion (has no administration in time range)  dextrose 5 % in lactated ringers infusion (has no administration in time range)  dextrose 50 % solution 0-50 mL (has no administration in time range)  acetaminophen (TYLENOL) tablet 650 mg (has no administration in time range)  metoCLOPramide (REGLAN) injection 10 mg (has  no administration in time range)  Chlorhexidine Gluconate Cloth 2 % PADS 6 each (has no administration in time range)  Oral care mouth rinse (has no administration in time range)  lactated ringers bolus 1,000 mL (0 mLs Intravenous Stopped 10/17/21 1739)  ondansetron (ZOFRAN) injection 4 mg (4 mg Intravenous Given 10/17/21 1715)  lactated ringers bolus 1,000 mL (0 mLs Intravenous Stopped 10/17/21 1848)  lactated ringers bolus 1,000 mL (0 mLs Intravenous Stopped 10/17/21 1906)    ED Course/ Medical Decision Making/ A&P                           Medical Decision Making Amount and/or Complexity of Data Reviewed Labs: ordered. Radiology: ordered.  Risk OTC drugs. Prescription drug management. Decision regarding hospitalization.   This patient presents to the ED for concern of hyperglycemia, this involves an extensive number of treatment options, and is a complaint that carries with it a high risk of complications and morbidity.  The differential diagnosis includes DKA, HHS, medication nonadherence, infection, substance abuse   Co morbidities that complicate the patient evaluation  T1DM, asthma, migraine headaches, depression   Additional history obtained:  Additional history obtained from patient's friend External records from outside source obtained and reviewed including EMR   Lab Tests:  I Ordered, and personally interpreted labs.  The pertinent results include: Hyperglycemia with severe acidosis consistent with DKA.  Mild hyperkalemia is present.  Patient has AKI.  CBC consistent with hemoconcentration.  Leukocytosis is present, likely stress demargination.   Imaging Studies ordered:  I ordered imaging studies including chest x-ray I independently visualized and interpreted imaging which showed no acute findings I agree with the radiologist interpretation   Cardiac Monitoring: / EKG:  The patient was maintained on a cardiac monitor.  I personally viewed and  interpreted the cardiac monitored which showed an underlying rhythm of: Sinus rhythm   Consultations Obtained:  I requested consultation with the intensivist,  and discussed lab and imaging findings as well as pertinent plan - they recommend: Repeat blood gas and admit to hospitalist if improved.   Problem List / ED Course / Critical interventions / Medication management  Patient presents for hyperglycemia.  She is a type I diabetic and has had multiple admissions for DKA in the past.  Presentation on arrival is notable for tachycardia, tachypnea with deep respirations, and severely ill appearance.  She is, however, alert and oriented.  She is unable to describe when she began to feel unwell.  Her friend, who company is at bedside states that she seemed to be in her normal state of health last night.  Per chart review, she was seen in urgent care 3 days ago.  At that time, she was concerned about a pregnancy.  Her pregnancy testing was negative.  However, she was found to be hyperglycemic with ketonuria.  She endorsed abdominal discomfort, fatigue, urinary frequency, and vomiting at that time.  At that time, she also stated that her symptoms have been ongoing for the past 2 weeks.  Initial glucose was greater than 600 here in the ED today.  Bolus of IV fluids was started and laboratory work-up was initiated.  On reassessment, patient continues to [facility] appropriately.  I am able to obtain some further history.  She states that she has been feeling unwell for the past 2 weeks.  She is unaware of when she last took insulin.  She did have recurrence of nausea and vomiting.  Zofran was given.  Initial lab work shows mild hypokalemia.  Insulin gtt. was initiated.  Additional bolus of IV fluids was ordered.  Further lab work shows AKI, hemoconcentration, and leukocytosis.  Given the severity of her acidosis, critical care was consulted.  While in the ED, patient endorsed generalized headache.  This raised  concern for possible cerebral edema.  However, on assessment, patient is clinically improved from her time of arrival.  At this point, she is sitting up in bed speaking in full conversation.  She states that her headache was present prior to arrival and is consistent with prior migraine headaches.  Reglan was ordered.  I spoke with intensivist, Dr. Duwayne Heck, who recommends repeat blood gas.  If pH has improved, patient can be admitted to hospitalist.  Repeat blood gas showed a pH of 7.11, significantly improved from time of presentation.  Patient was admitted to hospitalist for further management. I ordered medication including IV fluids for dehydration and DKA; insulin gtt. for DKA; Tylenol and Reglan for headache; Zofran for nausea Reevaluation of the patient after these medicines showed that the patient improved I have reviewed the patients home medicines and have made adjustments as needed   Social Determinants of Health:  Frequent hospitalizations  CRITICAL CARE Performed by: Godfrey Pick   Total critical care time: 65 minutes  Critical care time was exclusive of separately billable procedures and treating other patients.  Critical care was necessary to treat or prevent imminent or life-threatening deterioration.  Critical care was time spent personally by me on the following activities: development of treatment plan with patient and/or surrogate as well as nursing, discussions with consultants, evaluation of patient's response to treatment, examination of patient, obtaining history from patient or surrogate, ordering and performing treatments and interventions, ordering and review of laboratory studies, ordering and review of radiographic studies, pulse oximetry and re-evaluation of patient's condition.         Final Clinical Impression(s) / ED Diagnoses Final diagnoses:  Diabetic ketoacidosis without coma associated with type 1 diabetes mellitus [facility])    Rx / DC Orders ED  Discharge Orders     None         Godfrey Pick, MD 10/17/21 2047

## 2021-10-18 ENCOUNTER — Encounter (HOSPITAL_COMMUNITY): Payer: Self-pay | Admitting: Internal Medicine

## 2021-10-18 ENCOUNTER — Other Ambulatory Visit: Payer: Self-pay

## 2021-10-18 DIAGNOSIS — E1010 Type 1 diabetes mellitus with ketoacidosis without coma: Secondary | ICD-10-CM | POA: Diagnosis not present

## 2021-10-18 DIAGNOSIS — E860 Dehydration: Secondary | ICD-10-CM | POA: Diagnosis present

## 2021-10-18 DIAGNOSIS — Z79899 Other long term (current) drug therapy: Secondary | ICD-10-CM | POA: Diagnosis not present

## 2021-10-18 DIAGNOSIS — R7989 Other specified abnormal findings of blood chemistry: Secondary | ICD-10-CM | POA: Diagnosis present

## 2021-10-18 DIAGNOSIS — Z91048 Other nonmedicinal substance allergy status: Secondary | ICD-10-CM | POA: Diagnosis not present

## 2021-10-18 DIAGNOSIS — J45909 Unspecified asthma, uncomplicated: Secondary | ICD-10-CM | POA: Diagnosis present

## 2021-10-18 DIAGNOSIS — Z833 Family history of diabetes mellitus: Secondary | ICD-10-CM | POA: Diagnosis not present

## 2021-10-18 DIAGNOSIS — R1114 Bilious vomiting: Secondary | ICD-10-CM | POA: Diagnosis not present

## 2021-10-18 DIAGNOSIS — Z91148 Patient's other noncompliance with medication regimen for other reason: Secondary | ICD-10-CM | POA: Diagnosis not present

## 2021-10-18 DIAGNOSIS — Z20822 Contact with and (suspected) exposure to covid-19: Secondary | ICD-10-CM | POA: Diagnosis present

## 2021-10-18 DIAGNOSIS — Z91040 Latex allergy status: Secondary | ICD-10-CM | POA: Diagnosis not present

## 2021-10-18 DIAGNOSIS — Z91013 Allergy to seafood: Secondary | ICD-10-CM | POA: Diagnosis not present

## 2021-10-18 DIAGNOSIS — N179 Acute kidney failure, unspecified: Secondary | ICD-10-CM

## 2021-10-18 DIAGNOSIS — Z794 Long term (current) use of insulin: Secondary | ICD-10-CM | POA: Diagnosis not present

## 2021-10-18 DIAGNOSIS — F4321 Adjustment disorder with depressed mood: Secondary | ICD-10-CM | POA: Diagnosis present

## 2021-10-18 DIAGNOSIS — Z825 Family history of asthma and other chronic lower respiratory diseases: Secondary | ICD-10-CM | POA: Diagnosis not present

## 2021-10-18 DIAGNOSIS — Z8249 Family history of ischemic heart disease and other diseases of the circulatory system: Secondary | ICD-10-CM | POA: Diagnosis not present

## 2021-10-18 DIAGNOSIS — F909 Attention-deficit hyperactivity disorder, unspecified type: Secondary | ICD-10-CM | POA: Diagnosis present

## 2021-10-18 DIAGNOSIS — F32A Depression, unspecified: Secondary | ICD-10-CM | POA: Diagnosis present

## 2021-10-18 LAB — GLUCOSE, CAPILLARY
Glucose-Capillary: 208 mg/dL — ABNORMAL HIGH (ref 70–99)
Glucose-Capillary: 190 mg/dL — ABNORMAL HIGH (ref 70–99)
Glucose-Capillary: 205 mg/dL — ABNORMAL HIGH (ref 70–99)
Glucose-Capillary: 231 mg/dL — ABNORMAL HIGH (ref 70–99)
Glucose-Capillary: 194 mg/dL — ABNORMAL HIGH (ref 70–99)
Glucose-Capillary: 192 mg/dL — ABNORMAL HIGH (ref 70–99)
Glucose-Capillary: 240 mg/dL — ABNORMAL HIGH (ref 70–99)
Glucose-Capillary: 251 mg/dL — ABNORMAL HIGH (ref 70–99)
Glucose-Capillary: 273 mg/dL — ABNORMAL HIGH (ref 70–99)
Glucose-Capillary: 309 mg/dL — ABNORMAL HIGH (ref 70–99)

## 2021-10-18 LAB — BASIC METABOLIC PANEL
Anion gap: 17 — ABNORMAL HIGH (ref 5–15)
Anion gap: 11 (ref 5–15)
Anion gap: 10 (ref 5–15)
BUN: 21 mg/dL — ABNORMAL HIGH (ref 6–20)
BUN: 19 mg/dL (ref 6–20)
BUN: 16 mg/dL (ref 6–20)
CO2: 8 mmol/L — ABNORMAL LOW (ref 22–32)
CO2: 13 mmol/L — ABNORMAL LOW (ref 22–32)
CO2: 16 mmol/L — ABNORMAL LOW (ref 22–32)
Calcium: 9.1 mg/dL (ref 8.9–10.3)
Calcium: 9.1 mg/dL (ref 8.9–10.3)
Calcium: 9 mg/dL (ref 8.9–10.3)
Chloride: 112 mmol/L — ABNORMAL HIGH (ref 98–111)
Chloride: 114 mmol/L — ABNORMAL HIGH (ref 98–111)
Chloride: 112 mmol/L — ABNORMAL HIGH (ref 98–111)
Creatinine, Ser: 0.98 mg/dL (ref 0.44–1.00)
Creatinine, Ser: 0.82 mg/dL (ref 0.44–1.00)
Creatinine, Ser: 0.69 mg/dL (ref 0.44–1.00)
GFR, Estimated: >60 mL/min (ref >60)
GFR, Estimated: >60 mL/min (ref >60)
GFR, Estimated: >60 mL/min (ref >60)
Glucose, Bld: 279 mg/dL — ABNORMAL HIGH (ref 70–99)
Glucose, Bld: 191 mg/dL — ABNORMAL HIGH (ref 70–99)
Glucose, Bld: 180 mg/dL — ABNORMAL HIGH (ref 70–99)
Potassium: 4.9 mmol/L (ref 3.5–5.1)
Potassium: 3.7 mmol/L (ref 3.5–5.1)
Potassium: 3.5 mmol/L (ref 3.5–5.1)
Sodium: 137 mmol/L (ref 135–145)
Sodium: 138 mmol/L (ref 135–145)
Sodium: 138 mmol/L (ref 135–145)

## 2021-10-18 LAB — HEPATIC FUNCTION PANEL
ALT: 21 U/L (ref 0–44)
AST: 21 U/L (ref 15–41)
Albumin: 3.3 g/dL — ABNORMAL LOW (ref 3.5–5.0)
Alkaline Phosphatase: 116 U/L (ref 38–126)
Bilirubin, Direct: <0.1 mg/dL (ref 0.0–0.2)
Total Bilirubin: 1.2 mg/dL (ref 0.3–1.2)
Total Protein: 7.3 g/dL (ref 6.5–8.1)

## 2021-10-18 LAB — CBG MONITORING, ED
Glucose-Capillary: 169 mg/dL — ABNORMAL HIGH (ref 70–99)
Glucose-Capillary: 221 mg/dL — ABNORMAL HIGH (ref 70–99)
Glucose-Capillary: 238 mg/dL — ABNORMAL HIGH (ref 70–99)

## 2021-10-18 LAB — CBC
HCT: 37.1 % (ref 36.0–46.0)
Hemoglobin: 11.7 g/dL — ABNORMAL LOW (ref 12.0–15.0)
MCH: 29.2 pg (ref 26.0–34.0)
MCHC: 31.5 g/dL (ref 30.0–36.0)
MCV: 92.5 fL (ref 80.0–100.0)
Platelets: 327 10*3/uL (ref 150–400)
RBC: 4.01 MIL/uL (ref 3.87–5.11)
RDW: 14.7 % (ref 11.5–15.5)
WBC: 13.9 10*3/uL — ABNORMAL HIGH (ref 4.0–10.5)
nRBC: 0 % (ref 0.0–0.2)

## 2021-10-18 LAB — BETA-HYDROXYBUTYRIC ACID
Beta-Hydroxybutyric Acid: 7.32 mmol/L — ABNORMAL HIGH (ref 0.05–0.27)
Beta-Hydroxybutyric Acid: 3.05 mmol/L — ABNORMAL HIGH (ref 0.05–0.27)

## 2021-10-18 LAB — MAGNESIUM: Magnesium: 1.9 mg/dL (ref 1.7–2.4)

## 2021-10-18 LAB — PHOSPHORUS: Phosphorus: 2.6 mg/dL (ref 2.5–4.6)

## 2021-10-18 MED ORDER — BUTALBITAL-APAP-CAFFEINE 50-325-40 MG PO TABS
1.0000 | ORAL_TABLET | Freq: Once | ORAL | Status: AC
  Administered 2021-10-18: 1 via ORAL
  Filled 2021-10-18 (×2): qty 1

## 2021-10-18 MED ORDER — SERTRALINE HCL 50 MG PO TABS
75.0000 mg | ORAL_TABLET | Freq: Every day | ORAL | Status: DC
  Administered 2021-10-18 – 2021-10-19 (×2): 75 mg via ORAL
  Filled 2021-10-18 (×2): qty 1

## 2021-10-18 MED ORDER — INSULIN GLARGINE-YFGN 100 UNIT/ML ~~LOC~~ SOLN
20.0000 [IU] | Freq: Two times a day (BID) | SUBCUTANEOUS | Status: DC
  Administered 2021-10-18 – 2021-10-19 (×3): 20 [IU] via SUBCUTANEOUS
  Filled 2021-10-18 (×4): qty 0.2

## 2021-10-18 MED ORDER — INSULIN ASPART 100 UNIT/ML IJ SOLN
0.0000 [IU] | Freq: Every day | INTRAMUSCULAR | Status: DC
  Administered 2021-10-18: 5 [IU] via SUBCUTANEOUS

## 2021-10-18 MED ORDER — INSULIN ASPART 100 UNIT/ML IJ SOLN
4.0000 [IU] | Freq: Three times a day (TID) | INTRAMUSCULAR | Status: DC
  Administered 2021-10-18 – 2021-10-19 (×3): 4 [IU] via SUBCUTANEOUS

## 2021-10-18 MED ORDER — INSULIN ASPART 100 UNIT/ML IJ SOLN
0.0000 [IU] | Freq: Three times a day (TID) | INTRAMUSCULAR | Status: DC
  Administered 2021-10-18: 7 [IU] via SUBCUTANEOUS
  Administered 2021-10-18: 5 [IU] via SUBCUTANEOUS
  Administered 2021-10-19: 3 [IU] via SUBCUTANEOUS

## 2021-10-18 NOTE — Progress Notes (Signed)
PROGRESS NOTE    Dawn [name]  KYH:[id number] DOB: 2000/02/26 DOA: 10/17/2021 PCP: Orvis Brill, DO    Brief Narrative:  21 year old with history of type 1 diabetes with noncompliance and frequent hospitalization, ADHD and asthma, migraine presented to the ER with nausea, vomiting for 2 weeks early in the morning.  Reports taking insulin consistently, however possibly noncompliant.  In the emergency room dehydrated, glucose 850 time and bicarbonate less than 7.  Is started on insulin infusion and admitted to ICU.   Assessment & Plan:   Diabetic ketoacidosis, type 1 diabetes uncontrolled with hyperglycemia: Recent known A1c of more than 14. -Treated with IV insulin IV fluids and aggressive electrolyte replacements with improvement.  Currently without nausea vomiting.  She is able to eat regular diet. -Transition off to subcutaneous insulin, continue monitoring electrolytes closely, check magnesium and phosphorus levels. -We will counsel about compliance, diabetic educator to see.  Nausea and vomiting: Benign abdominal exam.  Acute kidney injury: As above.  Improved.  ADHD and depression: On Zoloft.  Resume.  Asthma: Stable.  Bronchodilator as needed.   DVT prophylaxis: enoxaparin (LOVENOX) injection 40 mg Start: 10/17/21 2200   Code Status: Full code Family Communication: None at the bedside Disposition Plan: Status is: Inpatient Remains inpatient appropriate because: IV insulin, medication titration.  Can transfer to MedSurg bed once she will be able to come off of IV insulin.     Consultants:  None  Procedures:  None  Antimicrobials:  None   Subjective: Patient seen and examined.  She was very sleepy and difficult to wake up.  She will wake up and she tells me she has no problems today.  Denied any nausea or vomiting.  Did not engage in conversation about insulin uses.  Objective: Vitals:   10/18/21 0145 10/18/21 0400 10/18/21 0500 10/18/21 0900  BP: 91/63  117/82 113/80   Pulse: (!) 114 (!) 111 (!) 108   Resp: 18 18 15    Temp: 98.2 F (36.8 C) 98.2 F (36.8 C)  99.1 F (37.3 C)  TempSrc:    Axillary  SpO2: 97% 97% 97%   Weight:      Height:        Intake/Output Summary (Last 24 hours) at 10/18/2021 1129 Last data filed at 10/17/2021 1906 Gross per 24 hour  Intake 2199.8 ml  Output --  Net 2199.8 ml   Filed Weights   10/17/21 1626  Weight: 53.5 kg    Examination:  General exam: Appears calm and comfortable  Flat affect.  Sleepy.  Looks comfortable. Respiratory system: Clear to auscultation. Respiratory effort normal. Cardiovascular system: S1 & S2 heard, RRR. No JVD, murmurs, rubs, gallops or clicks. No pedal edema. Gastrointestinal system: Abdomen is nondistended, soft and nontender. No organomegaly or masses felt. Normal bowel sounds heard.    Data Reviewed: I have personally reviewed following labs and imaging studies  CBC: Recent Labs  Lab 10/17/21 1635 10/17/21 1656 10/18/21 0514  WBC 16.0*  --  13.9*  HGB 14.6 18.0* 11.7*  HCT 51.2* 53.0* 37.1  MCV 101.0*  --  92.5  PLT 221  --  151   Basic Metabolic Panel: Recent Labs  Lab 10/17/21 1635 10/17/21 1656 10/17/21 1712 10/18/21 0143 10/18/21 0514 10/18/21 0938  NA 136 137 140 137 138 138  K 5.3* 5.5* 4.6 4.9 3.7 3.5  CL 100 110 111 112* 114* 112*  CO2 <7*  --  8* 8* 13* 16*  GLUCOSE 859* >700* 304* 279* 191* 180*  BUN 29* 40* 26* 21* 19 16  CREATININE 1.51* 0.80 1.15* 0.98 0.82 0.69  CALCIUM 10.6*  --  9.9 9.1 9.1 9.0  MG  --   --   --   --   --  1.9  PHOS  --   --   --   --   --  2.6   GFR: Estimated Creatinine Clearance: 88.7 mL/min (by C-G formula based on SCr of 0.69 mg/dL). Liver Function Tests: Recent Labs  Lab 10/17/21 1635 10/18/21 0514  AST 37 21  ALT 32 21  ALKPHOS 190* 116  BILITOT 2.0* 1.2  PROT 10.5* 7.3  ALBUMIN 4.7 3.3*   No results for input(s): "LIPASE", "AMYLASE" in the last 168 hours. No results for input(s):  "AMMONIA" in the last 168 hours. Coagulation Profile: No results for input(s): "INR", "PROTIME" in the last 168 hours. Cardiac Enzymes: No results for input(s): "CKTOTAL", "CKMB", "CKMBINDEX", "TROPONINI" in the last 168 hours. BNP (last 3 results) No results for input(s): "PROBNP" in the last 8760 hours. HbA1C: No results for input(s): "HGBA1C" in the last 72 hours. CBG: Recent Labs  Lab 10/18/21 0650 10/18/21 0748 10/18/21 0857 10/18/21 0957 10/18/21 1103  GLUCAP 205* 231* 194* 192* 240*   Lipid Profile: No results for input(s): "CHOL", "HDL", "LDLCALC", "TRIG", "CHOLHDL", "LDLDIRECT" in the last 72 hours. Thyroid Function Tests: No results for input(s): "TSH", "T4TOTAL", "FREET4", "T3FREE", "THYROIDAB" in the last 72 hours. Anemia Panel: No results for input(s): "VITAMINB12", "FOLATE", "FERRITIN", "TIBC", "IRON", "RETICCTPCT" in the last 72 hours. Sepsis Labs: No results for input(s): "PROCALCITON", "LATICACIDVEN" in the last 168 hours.  Recent Results (from the past 240 hour(s))  Resp Panel by RT-PCR (Flu A&B, Covid) Anterior Nasal Swab     Status: None   Collection Time: 10/17/21  6:51 PM   Specimen: Anterior Nasal Swab  Result Value Ref Range Status   SARS Coronavirus 2 by RT PCR NEGATIVE NEGATIVE Final    Comment: (NOTE) SARS-CoV-2 target nucleic acids are NOT DETECTED.  The SARS-CoV-2 RNA is generally detectable in upper respiratory specimens during the acute phase of infection. The lowest concentration of SARS-CoV-2 viral copies this assay can detect is 138 copies/mL. A negative result does not preclude SARS-Cov-2 infection and should not be used as the sole basis for treatment or other patient management decisions. A negative result may occur with  improper specimen collection/handling, submission of specimen other than nasopharyngeal swab, presence of viral mutation(s) within the areas targeted by this assay, and inadequate number of viral copies(<138  copies/mL). A negative result must be combined with clinical observations, patient history, and epidemiological information. The expected result is Negative.  Fact Sheet for Patients:  EntrepreneurPulse.com.au  Fact Sheet for Healthcare Providers:  IncredibleEmployment.be  This test is no t yet approved or cleared by the [place] FDA and  has been authorized for detection and/or diagnosis of SARS-CoV-2 by FDA under an Emergency Use Authorization (EUA). This EUA will remain  in effect (meaning this test can be used) for the duration of the COVID-19 declaration under Section 564(b)(1) of the Act, 21 U.S.C.section 360bbb-3(b)(1), unless the authorization is terminated  or revoked sooner.       Influenza A by PCR NEGATIVE NEGATIVE Final   Influenza B by PCR NEGATIVE NEGATIVE Final    Comment: (NOTE) The Xpert Xpress SARS-CoV-2/FLU/RSV plus assay is intended as an aid in the diagnosis of influenza from Nasopharyngeal swab specimens and should not be used as a sole basis for  treatment. Nasal washings and aspirates are unacceptable for Xpert Xpress SARS-CoV-2/FLU/RSV testing.  Fact Sheet for Patients: EntrepreneurPulse.com.au  Fact Sheet for Healthcare Providers: IncredibleEmployment.be  This test is not yet approved or cleared by the [place] FDA and has been authorized for detection and/or diagnosis of SARS-CoV-2 by FDA under an Emergency Use Authorization (EUA). This EUA will remain in effect (meaning this test can be used) for the duration of the COVID-19 declaration under Section 564(b)(1) of the Act, 21 U.S.C. section 360bbb-3(b)(1), unless the authorization is terminated or revoked.  Performed at [facility], [place] [address] [place], [place] [address]   MRSA Next Gen by PCR, Nasal     Status: None   Collection Time: 10/17/21  9:26 PM   Specimen: Nasal Mucosa; Nasal Swab   Result Value Ref Range Status   MRSA by PCR Next Gen NOT DETECTED NOT DETECTED Final    Comment: (NOTE) The GeneXpert MRSA Assay (FDA approved for NASAL specimens only), is one component of a comprehensive MRSA colonization surveillance program. It is not intended to diagnose MRSA infection nor to guide or monitor treatment for MRSA infections. Test performance is not FDA approved in patients less than 43 years old. Performed at [facility], [place] [address], [place], [place] [address]          Radiology Studies: US Abdomen Limited RUQ (LIVER/GB)  Result Date: 10/17/2021 CLINICAL DATA:  Elevated LFTs EXAM: ULTRASOUND ABDOMEN LIMITED RIGHT UPPER QUADRANT COMPARISON:  08/31/2021 CT FINDINGS: Gallbladder: No gallstones or wall thickening visualized. No sonographic Murphy sign noted by sonographer. Common bile duct: Diameter: 3 mm Liver: No focal lesion identified. Within normal limits in parenchymal echogenicity. Portal vein is patent on color Doppler imaging with normal direction of blood flow towards the liver. Other: None. IMPRESSION: No acute abnormality noted. Electronically Signed   By: Inez Catalina M.D.   On: 10/17/2021 22:11   DG Chest Portable 1 View  Result Date: 10/17/2021 CLINICAL DATA:  DKA EXAM: PORTABLE CHEST 1 VIEW COMPARISON:  Chest x-ray 04/14/2021 FINDINGS: The heart size and mediastinal contours are within normal limits. Both lungs are clear. The visualized skeletal structures are unremarkable. IMPRESSION: No active disease. Electronically Signed   By: Ronney Asters M.D.   On: 10/17/2021 19:15        Scheduled Meds:  Chlorhexidine Gluconate Cloth  6 each Topical Daily   enoxaparin (LOVENOX) injection  40 mg Subcutaneous Q24H   insulin aspart  0-5 Units Subcutaneous QHS   insulin aspart  0-9 Units Subcutaneous TID WC   insulin aspart  4 Units Subcutaneous TID WC   insulin glargine-yfgn  20 Units Subcutaneous BID   Continuous Infusions:   dextrose 5% lactated ringers 125 mL/hr at 10/18/21 0848   insulin 1.7 Units/hr (10/18/21 0432)   lactated ringers Stopped (10/18/21 0250)     LOS: 0 days    Time spent: 35 minutes    Barb Merino, MD [facility] Pager [phone]

## 2021-10-18 NOTE — Progress Notes (Signed)
eLink Physician-Brief Progress Note Patient Name: Dawn [name] DOB: [birth year] MRN: [id number]   Date of Service  10/18/2021  HPI/Events of Note  21/F with hx of type 1DM with frequent hospitalizations for DKA, also with hx of ADHD, depression, asthma, presenting to the ED with hyperglycemia, nausea and vomiting.    In the ED, pt had glucose of 859, pH 7.1.  PT started on IVFs and insulin.   BHB 7.82 Crea 1.5 --> 0.98  Pt appears comfortable on camera assessment.  BP 113/80, HR 87, RR 15, O2 sats 97%.   eICU Interventions  Continue insulin gtt as per protocol.  Continue IVFs.  Continue following BMP every 4 hrs. Last anion gap still at 17.  Anti-emetic as needed.  Lovenox for DVT prophylaxis.      Intervention Category Evaluation Type: New Patient Evaluation  Elsie Lincoln 10/18/2021, 5:47 AM

## 2021-10-19 DIAGNOSIS — R1114 Bilious vomiting: Secondary | ICD-10-CM

## 2021-10-19 DIAGNOSIS — F32A Depression, unspecified: Secondary | ICD-10-CM

## 2021-10-19 DIAGNOSIS — N179 Acute kidney failure, unspecified: Secondary | ICD-10-CM | POA: Diagnosis not present

## 2021-10-19 DIAGNOSIS — E1010 Type 1 diabetes mellitus with ketoacidosis without coma: Secondary | ICD-10-CM | POA: Diagnosis not present

## 2021-10-19 LAB — BASIC METABOLIC PANEL
Anion gap: 7 (ref 5–15)
BUN: 13 mg/dL (ref 6–20)
CO2: 21 mmol/L — ABNORMAL LOW (ref 22–32)
Calcium: 8.6 mg/dL — ABNORMAL LOW (ref 8.9–10.3)
Chloride: 110 mmol/L (ref 98–111)
Creatinine, Ser: 0.53 mg/dL (ref 0.44–1.00)
GFR, Estimated: >60 mL/min (ref >60)
Glucose, Bld: 222 mg/dL — ABNORMAL HIGH (ref 70–99)
Potassium: 3.1 mmol/L — ABNORMAL LOW (ref 3.5–5.1)
Sodium: 138 mmol/L (ref 135–145)

## 2021-10-19 LAB — GLUCOSE, CAPILLARY
Glucose-Capillary: 358 mg/dL — ABNORMAL HIGH (ref 70–99)
Glucose-Capillary: 172 mg/dL — ABNORMAL HIGH (ref 70–99)
Glucose-Capillary: 241 mg/dL — ABNORMAL HIGH (ref 70–99)

## 2021-10-19 MED ORDER — LANTUS SOLOSTAR 100 UNIT/ML ~~LOC~~ SOPN: 20.0000 [IU] | PEN_INJECTOR | Freq: Two times a day (BID) | SUBCUTANEOUS | 11 refills | Status: DC

## 2021-10-19 MED ORDER — POTASSIUM CHLORIDE CRYS ER 20 MEQ PO TBCR
40.0000 meq | EXTENDED_RELEASE_TABLET | Freq: Two times a day (BID) | ORAL | Status: DC
  Administered 2021-10-19: 40 meq via ORAL
  Filled 2021-10-19: qty 2

## 2021-10-19 MED ORDER — POTASSIUM CHLORIDE CRYS ER 20 MEQ PO TBCR: 40.0000 meq | EXTENDED_RELEASE_TABLET | Freq: Every day | ORAL | 0 refills | Status: DC

## 2021-10-19 NOTE — Discharge Summary (Signed)
Physician Discharge Summary  Dawn [name] GBT:[id number] DOB: Nov 21, 2000 DOA: 10/17/2021  PCP: Orvis Brill, DO  Admit date: 10/17/2021 Discharge date: 10/19/2021  Admitted From: Home Disposition: Home  Recommendations for Outpatient Follow-up:  Follow up with PCP in 1-2 weeks Please obtain BMP/CBC/magnesium/phosphorus in one week Continue follow-up with your diabetic doctor as scheduled.   Discharge Condition: Stable CODE STATUS: Full code Diet recommendation: Carb controlled diet  Discharge summary: 21 year old with history of type 1 diabetes with noncompliance and frequent hospitalization, ADHD and asthma, migraine presented to the ER with nausea, vomiting for 2 weeks early in the morning.  Reports taking insulin consistently, however possibly noncompliant.  In the emergency room dehydrated, glucose 850 time and bicarbonate less than 7.  started on insulin infusion and admitted to ICU. Patient did quick recovery and became asymptomatic after IV insulin doses. Due to significant symptoms, she was monitored in the hospital with subcutaneous insulin and did well.  Electrolytes were replaced and adequate.  Hemoglobin A1c more than 15.5  Plan: Extensive counseling discussion about compliance and follow-up.  Patient is agreeable. At home on Lantus 20 units daily, will increase to Lantus 20 units twice daily because she most likely skips the short acting insulin. She will continue carb count and sliding scale insulin.  She is scheduled to see her provider this on 10/19, will advised to restart new doses of Lantus and follow-up. Potassium was replaced before discharge, will also prescribe some potassium to pharmacy.  Renal functions are stabilized.  She takes Zoloft for ADHD and depression.  She has no evidence of asthma exacerbation. Stable for discharge.  , Very high risk of readmission given compliance, social issues and housing issues.  She is going to live with her stepmom now  and follow-up with endocrine at [facility].    Discharge Diagnoses:  Principal Problem:   DKA (diabetic ketoacidosis) ([place]) Active Problems:   Depression   Nausea and vomiting   DKA, type 1 (HCC)   AKI (acute kidney injury) ([place])   Leukocytosis    Discharge Instructions  Discharge Instructions     Call MD for:  persistant nausea and vomiting   Complete by: As directed    Diet Carb Modified   Complete by: As directed    Discharge instructions   Complete by: As directed    Take long acting insulin 20 units 2 times a day and keep blood sugars records   Increase activity slowly   Complete by: As directed       Allergies as of 10/19/2021       Reactions   Latex Itching, Swelling, Other (See Comments)   Causes skin irritation   Tape Other (See Comments)   Causes skin irritation   Shellfish Allergy Itching, Rash, Other (See Comments)   Reaction to scallops        Medication List     STOP taking these medications    acetaminophen 325 MG tablet Commonly known as: TYLENOL   albuterol 108 (90 Base) MCG/ACT inhaler Commonly known as: VENTOLIN HFA       TAKE these medications    Accu-Chek Guide test strip Generic drug: glucose blood Use to check glucose 6 times daily   Accu-Chek Guide w/Device Kit Use 3 (three) times daily before meals.   Accu-Chek Softclix Lancets lancets Please use to check blood sugar up to 6 times/day.   blood glucose meter kit and supplies Use up to four times daily as directed. (Dispense based on patient and insurance  preference. Use up to four times daily as directed. (FOR ICD-10 E10.9, E11.9).)   Dexcom G6 Sensor Misc Replace sensor every 10 days.   HumaLOG KwikPen 100 UNIT/ML KwikPen Generic drug: insulin lispro Inject 5-15 Units into the skin 3 times daily with meals- per sliding scale based on carb count. (See admin instructions.) What changed:  how much to take additional instructions   Lantus SoloStar 100 UNIT/ML  Solostar Pen Generic drug: insulin glargine Inject 20 Units into the skin 2 (two) times daily. What changed: when to take this   Pentips 32G X 4 MM Misc Generic drug: Insulin Pen Needle USE TO INJECT INSULIN 6 TIMES DAILY   potassium chloride SA 20 MEQ tablet Commonly known as: KLOR-CON M Take 2 tablets (40 mEq total) by mouth daily for 3 days.   sertraline 25 MG tablet Commonly known as: ZOLOFT Take 3 tablets (75 mg total) by mouth daily.   SUMAtriptan 25 MG tablet Commonly known as: Imitrex Take 0.5 tablets (12.5 mg total) by mouth every 2 (two) hours as needed for migraine. May repeat in 2 hours if headache persists or recurs.        Allergies  Allergen Reactions   Latex Itching, Swelling and Other (See Comments)    Causes skin irritation   Tape Other (See Comments)    Causes skin irritation   Shellfish Allergy Itching, Rash and Other (See Comments)    Reaction to scallops    Consultations: None   Procedures/Studies: US Abdomen Limited RUQ (LIVER/GB)  Result Date: 10/17/2021 CLINICAL DATA:  Elevated LFTs EXAM: ULTRASOUND ABDOMEN LIMITED RIGHT UPPER QUADRANT COMPARISON:  08/31/2021 CT FINDINGS: Gallbladder: No gallstones or wall thickening visualized. No sonographic Murphy sign noted by sonographer. Common bile duct: Diameter: 3 mm Liver: No focal lesion identified. Within normal limits in parenchymal echogenicity. Portal vein is patent on color Doppler imaging with normal direction of blood flow towards the liver. Other: None. IMPRESSION: No acute abnormality noted. Electronically Signed   By: Inez Catalina M.D.   On: 10/17/2021 22:11   DG Chest Portable 1 View  Result Date: 10/17/2021 CLINICAL DATA:  DKA EXAM: PORTABLE CHEST 1 VIEW COMPARISON:  Chest x-ray 04/14/2021 FINDINGS: The heart size and mediastinal contours are within normal limits. Both lungs are clear. The visualized skeletal structures are unremarkable. IMPRESSION: No active disease. Electronically  Signed   By: Ronney Asters M.D.   On: 10/17/2021 19:15   (Echo, Carotid, EGD, Colonoscopy, ERCP)    Subjective: Patient seen and examined.  No overnight events.  Eating well.  Denies any nausea vomiting chest pain or shortness of breath.  She wanted to discuss about new insulin regimen that we are using in the hospital, I changed her insulin regimen and patient is aware to use new regimen.   Discharge Exam: Vitals:   10/19/21 0137 10/19/21 0525  BP: 113/83 98/72  Pulse: 85 87  Resp: 16 17  Temp: 97.8 F (36.6 C) 97.7 F (36.5 C)  SpO2: 100% 100%   Vitals:   10/18/21 1848 10/18/21 2150 10/19/21 0137 10/19/21 0525  BP: 109/75 102/81 113/83 98/72  Pulse: 100 (!) 109 85 87  Resp: 18 18 16 17   Temp: 98 F (36.7 C) 98.5 F (36.9 C) 97.8 F (36.6 C) 97.7 F (36.5 C)  TempSrc: Oral Oral Oral Oral  SpO2: 100% 100% 100% 100%  Weight:      Height:        General: Pt is alert, awake, not in acute  distress Cardiovascular: RRR, S1/S2 +, no rubs, no gallops Respiratory: CTA bilaterally, no wheezing, no rhonchi Abdominal: Soft, NT, ND, bowel sounds + Extremities: no edema, no cyanosis    The results of significant diagnostics from this hospitalization (including imaging, microbiology, ancillary and laboratory) are listed below for reference.     Microbiology: Recent Results (from the past 240 hour(s))  Resp Panel by RT-PCR (Flu A&B, Covid) Anterior Nasal Swab     Status: None   Collection Time: 10/17/21  6:51 PM   Specimen: Anterior Nasal Swab  Result Value Ref Range Status   SARS Coronavirus 2 by RT PCR NEGATIVE NEGATIVE Final    Comment: (NOTE) SARS-CoV-2 target nucleic acids are NOT DETECTED.  The SARS-CoV-2 RNA is generally detectable in upper respiratory specimens during the acute phase of infection. The lowest concentration of SARS-CoV-2 viral copies this assay can detect is 138 copies/mL. A negative result does not preclude SARS-Cov-2 infection and should not be used  as the sole basis for treatment or other patient management decisions. A negative result may occur with  improper specimen collection/handling, submission of specimen other than nasopharyngeal swab, presence of viral mutation(s) within the areas targeted by this assay, and inadequate number of viral copies(<138 copies/mL). A negative result must be combined with clinical observations, patient history, and epidemiological information. The expected result is Negative.  Fact Sheet for Patients:  EntrepreneurPulse.com.au  Fact Sheet for Healthcare Providers:  IncredibleEmployment.be  This test is no t yet approved or cleared by the [place] FDA and  has been authorized for detection and/or diagnosis of SARS-CoV-2 by FDA under an Emergency Use Authorization (EUA). This EUA will remain  in effect (meaning this test can be used) for the duration of the COVID-19 declaration under Section 564(b)(1) of the Act, 21 U.S.C.section 360bbb-3(b)(1), unless the authorization is terminated  or revoked sooner.       Influenza A by PCR NEGATIVE NEGATIVE Final   Influenza B by PCR NEGATIVE NEGATIVE Final    Comment: (NOTE) The Xpert Xpress SARS-CoV-2/FLU/RSV plus assay is intended as an aid in the diagnosis of influenza from Nasopharyngeal swab specimens and should not be used as a sole basis for treatment. Nasal washings and aspirates are unacceptable for Xpert Xpress SARS-CoV-2/FLU/RSV testing.  Fact Sheet for Patients: EntrepreneurPulse.com.au  Fact Sheet for Healthcare Providers: IncredibleEmployment.be  This test is not yet approved or cleared by the [place] FDA and has been authorized for detection and/or diagnosis of SARS-CoV-2 by FDA under an Emergency Use Authorization (EUA). This EUA will remain in effect (meaning this test can be used) for the duration of the COVID-19 declaration under Section 564(b)(1)  of the Act, 21 U.S.C. section 360bbb-3(b)(1), unless the authorization is terminated or revoked.  Performed at [facility], [place] [address], [place], [place] [address]   MRSA Next Gen by PCR, Nasal     Status: None   Collection Time: 10/17/21  9:26 PM   Specimen: Nasal Mucosa; Nasal Swab  Result Value Ref Range Status   MRSA by PCR Next Gen NOT DETECTED NOT DETECTED Final    Comment: (NOTE) The GeneXpert MRSA Assay (FDA approved for NASAL specimens only), is one component of a comprehensive MRSA colonization surveillance program. It is not intended to diagnose MRSA infection nor to guide or monitor treatment for MRSA infections. Test performance is not FDA approved in patients less than 57 years old. Performed at [facility], [place] [address], [place], [place] [address]  Labs: BNP (last 3 results) No results for input(s): "BNP" in the last 8760 hours. Basic Metabolic Panel: Recent Labs  Lab 10/17/21 1712 10/18/21 0143 10/18/21 0514 10/18/21 0938 10/19/21 0431  NA 140 137 138 138 138  K 4.6 4.9 3.7 3.5 3.1*  CL 111 112* 114* 112* 110  CO2 8* 8* 13* 16* 21*  GLUCOSE 304* 279* 191* 180* 222*  BUN 26* 21* 19 16 13   CREATININE 1.15* 0.98 0.82 0.69 0.53  CALCIUM 9.9 9.1 9.1 9.0 8.6*  MG  --   --   --  1.9  --   PHOS  --   --   --  2.6  --    Liver Function Tests: Recent Labs  Lab 10/17/21 1635 10/18/21 0514  AST 37 21  ALT 32 21  ALKPHOS 190* 116  BILITOT 2.0* 1.2  PROT 10.5* 7.3  ALBUMIN 4.7 3.3*   No results for input(s): "LIPASE", "AMYLASE" in the last 168 hours. No results for input(s): "AMMONIA" in the last 168 hours. CBC: Recent Labs  Lab 10/17/21 1635 10/17/21 1656 10/18/21 0514  WBC 16.0*  --  13.9*  HGB 14.6 18.0* 11.7*  HCT 51.2* 53.0* 37.1  MCV 101.0*  --  92.5  PLT 221  --  327   Cardiac Enzymes: No results for input(s): "CKTOTAL", "CKMB", "CKMBINDEX", "TROPONINI" in the last 168  hours. BNP: Invalid input(s): "POCBNP" CBG: Recent Labs  Lab 10/18/21 1447 10/18/21 1732 10/18/21 2153 10/19/21 0138 10/19/21 0716  GLUCAP 273* 309* 358* 172* 241*   D-Dimer No results for input(s): "DDIMER" in the last 72 hours. Hgb A1c No results for input(s): "HGBA1C" in the last 72 hours. Lipid Profile No results for input(s): "CHOL", "HDL", "LDLCALC", "TRIG", "CHOLHDL", "LDLDIRECT" in the last 72 hours. Thyroid function studies No results for input(s): "TSH", "T4TOTAL", "T3FREE", "THYROIDAB" in the last 72 hours.  Invalid input(s): "FREET3" Anemia work up No results for input(s): "VITAMINB12", "FOLATE", "FERRITIN", "TIBC", "IRON", "RETICCTPCT" in the last 72 hours. Urinalysis    Component Value Date/Time   COLORURINE STRAW (A) 10/17/2021 1616   APPEARANCEUR CLEAR 10/17/2021 1616   APPEARANCEUR Clear 06/16/2021 1517   LABSPEC 1.020 10/17/2021 1616   PHURINE 5.0 10/17/2021 1616   GLUCOSEU >=500 (A) 10/17/2021 1616   HGBUR MODERATE (A) 10/17/2021 1616   BILIRUBINUR NEGATIVE 10/17/2021 1616   BILIRUBINUR negative 10/14/2021 0942   BILIRUBINUR Negative 06/16/2021 1517   KETONESUR 80 (A) 10/17/2021 1616   PROTEINUR 30 (A) 10/17/2021 1616   UROBILINOGEN 0.2 10/14/2021 0942   UROBILINOGEN 0.2 08/26/2019 1827   NITRITE NEGATIVE 10/17/2021 1616   LEUKOCYTESUR NEGATIVE 10/17/2021 1616   Sepsis Labs Recent Labs  Lab 10/17/21 1635 10/18/21 0514  WBC 16.0* 13.9*   Microbiology Recent Results (from the past 240 hour(s))  Resp Panel by RT-PCR (Flu A&B, Covid) Anterior Nasal Swab     Status: None   Collection Time: 10/17/21  6:51 PM   Specimen: Anterior Nasal Swab  Result Value Ref Range Status   SARS Coronavirus 2 by RT PCR NEGATIVE NEGATIVE Final    Comment: (NOTE) SARS-CoV-2 target nucleic acids are NOT DETECTED.  The SARS-CoV-2 RNA is generally detectable in upper respiratory specimens during the acute phase of infection. The lowest concentration of SARS-CoV-2  viral copies this assay can detect is 138 copies/mL. A negative result does not preclude SARS-Cov-2 infection and should not be used as the sole basis for treatment or other patient management decisions. A negative result may occur with  improper specimen collection/handling, submission of specimen other than nasopharyngeal swab, presence of viral mutation(s) within the areas targeted by this assay, and inadequate number of viral copies(<138 copies/mL). A negative result must be combined with clinical observations, patient history, and epidemiological information. The expected result is Negative.  Fact Sheet for Patients:  EntrepreneurPulse.com.au  Fact Sheet for Healthcare Providers:  IncredibleEmployment.be  This test is no t yet approved or cleared by the [place] FDA and  has been authorized for detection and/or diagnosis of SARS-CoV-2 by FDA under an Emergency Use Authorization (EUA). This EUA will remain  in effect (meaning this test can be used) for the duration of the COVID-19 declaration under Section 564(b)(1) of the Act, 21 U.S.C.section 360bbb-3(b)(1), unless the authorization is terminated  or revoked sooner.       Influenza A by PCR NEGATIVE NEGATIVE Final   Influenza B by PCR NEGATIVE NEGATIVE Final    Comment: (NOTE) The Xpert Xpress SARS-CoV-2/FLU/RSV plus assay is intended as an aid in the diagnosis of influenza from Nasopharyngeal swab specimens and should not be used as a sole basis for treatment. Nasal washings and aspirates are unacceptable for Xpert Xpress SARS-CoV-2/FLU/RSV testing.  Fact Sheet for Patients: EntrepreneurPulse.com.au  Fact Sheet for Healthcare Providers: IncredibleEmployment.be  This test is not yet approved or cleared by the [place] FDA and has been authorized for detection and/or diagnosis of SARS-CoV-2 by FDA under an Emergency Use Authorization  (EUA). This EUA will remain in effect (meaning this test can be used) for the duration of the COVID-19 declaration under Section 564(b)(1) of the Act, 21 U.S.C. section 360bbb-3(b)(1), unless the authorization is terminated or revoked.  Performed at [facility], [place] [address], [place], [place] [address]   MRSA Next Gen by PCR, Nasal     Status: None   Collection Time: 10/17/21  9:26 PM   Specimen: Nasal Mucosa; Nasal Swab  Result Value Ref Range Status   MRSA by PCR Next Gen NOT DETECTED NOT DETECTED Final    Comment: (NOTE) The GeneXpert MRSA Assay (FDA approved for NASAL specimens only), is one component of a comprehensive MRSA colonization surveillance program. It is not intended to diagnose MRSA infection nor to guide or monitor treatment for MRSA infections. Test performance is not FDA approved in patients less than 39 years old. Performed at [facility], [place] [address] [place], [place] [address]      Time coordinating discharge: 35 minutes  SIGNED:   Barb Merino, MD  [facility] 10/19/2021, 11:04 AM

## 2021-10-19 NOTE — Inpatient Diabetes Management (Signed)
Inpatient Diabetes Program Recommendations  AACE/ADA: New Consensus Statement on Inpatient Glycemic Control (2015)  Target Ranges:  Prepandial:   less than 140 mg/dL      Peak postprandial:   less than 180 mg/dL (1-2 hours)      Critically ill patients:  140 - 180 mg/dL   Lab Results  Component Value Date   GLUCAP 241 (H) 10/19/2021   HGBA1C >15.5 (H) 09/30/2021    Review of Glycemic Control  Diabetes history: DM1 Outpatient Diabetes medications: Lantus 20 BID, Humalog 5-20 TID Current orders for Inpatient glycemic control: Semglee 20 BID, Novolog 0-9 units TID with meals and 0-5 HS + 4 units TID  HgbA1C - > 15.5%  Inpatient Diabetes Program Recommendations:    Long discussion regarding HgbA1C and taking her insulin as prescribed. Pt denies hypos. Discussed s/s and treatment. Discussed that pt thinks she may be pregnant and talked about need for better glucose control if she is planning to have a baby. Has Libre to monitor blood sugars. Getting new Endo in [place]. [place] Michela Pitcher she is homeless "most of the time." No issues with obtaining insulin or monitoring device. Said no matter what she does, her blood sugars are high. Discussed injection sites, rotating, and insulin storage. Talked about glucose and HgbA1C goals. Pt seems more talkative than usual. Active listening and pt states she just needs to get housing.   Continue to follow.  Thank you. Lorenda Peck, RD, LDN, [place] Inpatient Diabetes Coordinator [phone]

## 2021-10-19 NOTE — Plan of Care (Signed)
Patient is stable for discharge. Discharge instructions given, patient understood instructions. All questions answered, patient taken downstairs by tech to discharge patient home with mother.

## 2021-11-30 ENCOUNTER — Other Ambulatory Visit: Payer: Self-pay | Admitting: Nurse Practitioner

## 2021-11-30 DIAGNOSIS — N644 Mastodynia: Secondary | ICD-10-CM

## 2021-12-07 ENCOUNTER — Emergency Department (HOSPITAL_COMMUNITY)
Admission: EM | Admit: 2021-12-07 | Discharge: 2021-12-08 | Discharge status: Against Medical Advice | Payer: Medicaid Other | Attending: Emergency Medicine | Admitting: Emergency Medicine

## 2021-12-07 ENCOUNTER — Other Ambulatory Visit: Payer: Self-pay

## 2021-12-07 DIAGNOSIS — Z5321 Procedure and treatment not carried out due to patient leaving prior to being seen by health care provider: Secondary | ICD-10-CM | POA: Insufficient documentation

## 2021-12-07 DIAGNOSIS — E1065 Type 1 diabetes mellitus with hyperglycemia: Secondary | ICD-10-CM | POA: Insufficient documentation

## 2021-12-07 DIAGNOSIS — Z20822 Contact with and (suspected) exposure to covid-19: Secondary | ICD-10-CM | POA: Diagnosis not present

## 2021-12-07 DIAGNOSIS — R0981 Nasal congestion: Secondary | ICD-10-CM | POA: Diagnosis not present

## 2021-12-07 DIAGNOSIS — Z794 Long term (current) use of insulin: Secondary | ICD-10-CM | POA: Diagnosis not present

## 2021-12-07 DIAGNOSIS — R1030 Lower abdominal pain, unspecified: Secondary | ICD-10-CM | POA: Diagnosis not present

## 2021-12-07 LAB — COMPREHENSIVE METABOLIC PANEL
ALT: 18 U/L (ref 0–44)
AST: 18 U/L (ref 15–41)
Albumin: 3.1 g/dL — ABNORMAL LOW (ref 3.5–5.0)
Alkaline Phosphatase: 99 U/L (ref 38–126)
Anion gap: 12 (ref 5–15)
BUN: 8 mg/dL (ref 6–20)
CO2: 19 mmol/L — ABNORMAL LOW (ref 22–32)
Calcium: 8.7 mg/dL — ABNORMAL LOW (ref 8.9–10.3)
Chloride: 103 mmol/L (ref 98–111)
Creatinine, Ser: 0.57 mg/dL (ref 0.44–1.00)
GFR, Estimated: >60 mL/min (ref >60)
Glucose, Bld: 408 mg/dL — ABNORMAL HIGH (ref 70–99)
Potassium: 3.7 mmol/L (ref 3.5–5.1)
Sodium: 134 mmol/L — ABNORMAL LOW (ref 135–145)
Total Bilirubin: 1 mg/dL (ref 0.3–1.2)
Total Protein: 6.7 g/dL (ref 6.5–8.1)

## 2021-12-07 LAB — URINALYSIS, ROUTINE W REFLEX MICROSCOPIC
Bilirubin Urine: NEGATIVE
Glucose, UA: >=500 mg/dL — AB
Hgb urine dipstick: NEGATIVE
Ketones, ur: 80 mg/dL — AB
Nitrite: NEGATIVE
Protein, ur: NEGATIVE mg/dL
Specific Gravity, Urine: 1.025 (ref 1.005–1.030)
pH: 6 (ref 5.0–8.0)

## 2021-12-07 LAB — CBC WITH DIFFERENTIAL/PLATELET
Abs Immature Granulocytes: 0.01 10*3/uL (ref 0.00–0.07)
Basophils Absolute: 0.1 10*3/uL (ref 0.0–0.1)
Basophils Relative: 1 %
Eosinophils Absolute: 0.2 10*3/uL (ref 0.0–0.5)
Eosinophils Relative: 4 %
HCT: 40.1 % (ref 36.0–46.0)
Hemoglobin: 12.7 g/dL (ref 12.0–15.0)
Immature Granulocytes: 0 %
Lymphocytes Relative: 45 %
Lymphs Abs: 2.9 10*3/uL (ref 0.7–4.0)
MCH: 28 pg (ref 26.0–34.0)
MCHC: 31.7 g/dL (ref 30.0–36.0)
MCV: 88.5 fL (ref 80.0–100.0)
Monocytes Absolute: 0.4 10*3/uL (ref 0.1–1.0)
Monocytes Relative: 6 %
Neutro Abs: 2.9 10*3/uL (ref 1.7–7.7)
Neutrophils Relative %: 44 %
Platelets: 198 10*3/uL (ref 150–400)
RBC: 4.53 MIL/uL (ref 3.87–5.11)
RDW: 13.6 % (ref 11.5–15.5)
WBC: 6.5 10*3/uL (ref 4.0–10.5)
nRBC: 0 % (ref 0.0–0.2)

## 2021-12-07 LAB — I-STAT BETA HCG BLOOD, ED (MC, WL, AP ONLY): I-stat hCG, quantitative: <5 m[IU]/mL (ref <5)

## 2021-12-07 LAB — RESP PANEL BY RT-PCR (FLU A&B, COVID) ARPGX2
Influenza A by PCR: NEGATIVE
Influenza B by PCR: NEGATIVE
SARS Coronavirus 2 by RT PCR: NEGATIVE

## 2021-12-07 LAB — CBG MONITORING, ED: Glucose-Capillary: 407 mg/dL — ABNORMAL HIGH (ref 70–99)

## 2021-12-07 NOTE — ED Triage Notes (Signed)
Pt has multiple complaints states has not been taking her blood sugar lately, c/o right lower abd pain and breast tenderness is req pregnancy test.

## 2021-12-07 NOTE — ED Provider Triage Note (Signed)
Emergency Medicine Provider Triage Evaluation Note  Dawn [name] , a 21 y.o. female  was evaluated in triage.  Pt complains of tenderness, missed.  And lower abdominal pain x1 month.  Patient is also type I diabetic and uninsured she has been taking her insulin but not checking her sugars.  She began having nasal congestion last night and came in. .  Review of Systems  Positive: congestion Negative: Urinary sxs  Physical Exam  Ht 5\' 2"  (1.575 m)   Wt 53 kg   BMI 21.37 kg/m  Gen:   Awake, no distress   Resp:  Normal effort MSK:   Moves extremities without difficulty  Other:  No abd ttp  Medical Decision Making  Medically screening exam initiated at 5:42 PM.  Appropriate orders placed.  Meadow Abramo was informed that the remainder of the evaluation will be completed by another provider, this initial triage assessment does not replace that evaluation, and the importance of remaining in the ED until their evaluation is complete.  Cbg> 400   [name], PA-C 12/07/21 1749

## 2021-12-13 ENCOUNTER — Emergency Department (HOSPITAL_COMMUNITY)
Admission: EM | Admit: 2021-12-13 | Discharge: 2021-12-13 | Discharge status: Against Medical Advice | Payer: Medicaid Other | Attending: Emergency Medicine | Admitting: Emergency Medicine

## 2021-12-13 ENCOUNTER — Other Ambulatory Visit: Payer: Self-pay

## 2021-12-13 ENCOUNTER — Encounter (HOSPITAL_COMMUNITY): Payer: Self-pay

## 2021-12-13 DIAGNOSIS — Z5321 Procedure and treatment not carried out due to patient leaving prior to being seen by health care provider: Secondary | ICD-10-CM | POA: Diagnosis not present

## 2021-12-13 DIAGNOSIS — N644 Mastodynia: Secondary | ICD-10-CM | POA: Diagnosis not present

## 2021-12-13 DIAGNOSIS — R109 Unspecified abdominal pain: Secondary | ICD-10-CM | POA: Insufficient documentation

## 2021-12-13 LAB — CBC WITH DIFFERENTIAL/PLATELET
Abs Immature Granulocytes: 0.01 10*3/uL (ref 0.00–0.07)
Basophils Absolute: 0 10*3/uL (ref 0.0–0.1)
Basophils Relative: 1 %
Eosinophils Absolute: 0.1 10*3/uL (ref 0.0–0.5)
Eosinophils Relative: 2 %
HCT: 41.5 % (ref 36.0–46.0)
Hemoglobin: 13.2 g/dL (ref 12.0–15.0)
Immature Granulocytes: 0 %
Lymphocytes Relative: 44 %
Lymphs Abs: 2.8 10*3/uL (ref 0.7–4.0)
MCH: 27.8 pg (ref 26.0–34.0)
MCHC: 31.8 g/dL (ref 30.0–36.0)
MCV: 87.6 fL (ref 80.0–100.0)
Monocytes Absolute: 0.3 10*3/uL (ref 0.1–1.0)
Monocytes Relative: 5 %
Neutro Abs: 3 10*3/uL (ref 1.7–7.7)
Neutrophils Relative %: 48 %
Platelets: 197 10*3/uL (ref 150–400)
RBC: 4.74 MIL/uL (ref 3.87–5.11)
RDW: 13.6 % (ref 11.5–15.5)
WBC: 6.2 10*3/uL (ref 4.0–10.5)
nRBC: 0 % (ref 0.0–0.2)

## 2021-12-13 LAB — COMPREHENSIVE METABOLIC PANEL
ALT: 13 U/L (ref 0–44)
AST: 12 U/L — ABNORMAL LOW (ref 15–41)
Albumin: 2.9 g/dL — ABNORMAL LOW (ref 3.5–5.0)
Alkaline Phosphatase: 94 U/L (ref 38–126)
Anion gap: 11 (ref 5–15)
BUN: 18 mg/dL (ref 6–20)
CO2: 17 mmol/L — ABNORMAL LOW (ref 22–32)
Calcium: 8.6 mg/dL — ABNORMAL LOW (ref 8.9–10.3)
Chloride: 107 mmol/L (ref 98–111)
Creatinine, Ser: 0.78 mg/dL (ref 0.44–1.00)
GFR, Estimated: >60 mL/min (ref >60)
Glucose, Bld: 476 mg/dL — ABNORMAL HIGH (ref 70–99)
Potassium: 4.1 mmol/L (ref 3.5–5.1)
Sodium: 135 mmol/L (ref 135–145)
Total Bilirubin: 0.9 mg/dL (ref 0.3–1.2)
Total Protein: 6.3 g/dL — ABNORMAL LOW (ref 6.5–8.1)

## 2021-12-13 LAB — URINALYSIS, ROUTINE W REFLEX MICROSCOPIC
Bacteria, UA: NONE SEEN
Bilirubin Urine: NEGATIVE
Glucose, UA: >=500 mg/dL — AB
Ketones, ur: 80 mg/dL — AB
Leukocytes,Ua: NEGATIVE
Nitrite: NEGATIVE
Protein, ur: NEGATIVE mg/dL
Specific Gravity, Urine: 1.031 — ABNORMAL HIGH (ref 1.005–1.030)
pH: 6 (ref 5.0–8.0)

## 2021-12-13 LAB — LIPASE, BLOOD: Lipase: 87 U/L — ABNORMAL HIGH (ref 11–51)

## 2021-12-13 NOTE — ED Triage Notes (Signed)
Patient said she has abdominal pain on her right side. Breast pain and nipple pain. Patient said she has blood clots in her period.

## 2021-12-13 NOTE — ED Provider Triage Note (Signed)
Emergency Medicine Provider Triage Evaluation Note  Dawn [name] , a 21 y.o. female  was evaluated in triage.  Pt complains of remittent pelvic pain that has been ongoing for 2 months.  She says she has also had heavy vaginal bleeding this week with blood clots that is not normal for her.  She says her last menstrual cycle for this was in early November.  Review of Systems  Positive:  Negative:   Physical Exam  BP 118/77 (BP Location: Left Arm)   Pulse (!) 102   Temp 98.1 F (36.7 C) (Oral)   Resp 17   SpO2 100%  Gen:   Awake, no distress   Resp:  Normal effort  MSK:   Moves extremities without difficulty  Other:  Mild pelvic tenderness in suprapubic region  Medical Decision Making  Medically screening exam initiated at 9:56 AM.  Appropriate orders placed.  Dawn [name] was informed that the remainder of the evaluation will be completed by another provider, this initial triage assessment does not replace that evaluation, and the importance of remaining in the ED until their evaluation is complete.     Claudie Leach, [place] 12/13/21 [phone]

## 2021-12-13 NOTE — ED Notes (Signed)
Not able to obtain Istat Beta

## 2021-12-14 ENCOUNTER — Encounter (HOSPITAL_COMMUNITY): Payer: Self-pay

## 2021-12-14 ENCOUNTER — Other Ambulatory Visit: Payer: Self-pay

## 2021-12-14 ENCOUNTER — Observation Stay (HOSPITAL_COMMUNITY)
Admission: EM | Admit: 2021-12-14 | Discharge: 2021-12-15 | Disposition: A | Discharge status: Home / Self Care | Payer: Medicaid Other | Attending: Emergency Medicine | Admitting: Emergency Medicine

## 2021-12-14 DIAGNOSIS — Z91040 Latex allergy status: Secondary | ICD-10-CM | POA: Diagnosis not present

## 2021-12-14 DIAGNOSIS — Z794 Long term (current) use of insulin: Secondary | ICD-10-CM | POA: Insufficient documentation

## 2021-12-14 DIAGNOSIS — Z79899 Other long term (current) drug therapy: Secondary | ICD-10-CM | POA: Insufficient documentation

## 2021-12-14 DIAGNOSIS — R102 Pelvic and perineal pain: Secondary | ICD-10-CM | POA: Diagnosis present

## 2021-12-14 DIAGNOSIS — J45909 Unspecified asthma, uncomplicated: Secondary | ICD-10-CM | POA: Diagnosis not present

## 2021-12-14 DIAGNOSIS — Z3009 Encounter for other general counseling and advice on contraception: Secondary | ICD-10-CM | POA: Insufficient documentation

## 2021-12-14 DIAGNOSIS — E1010 Type 1 diabetes mellitus with ketoacidosis without coma: Secondary | ICD-10-CM | POA: Diagnosis not present

## 2021-12-14 DIAGNOSIS — E1110 Type 2 diabetes mellitus with ketoacidosis without coma: Secondary | ICD-10-CM | POA: Diagnosis present

## 2021-12-14 LAB — CBC
HCT: 46 % (ref 36.0–46.0)
Hemoglobin: 14.3 g/dL (ref 12.0–15.0)
MCH: 28.4 pg (ref 26.0–34.0)
MCHC: 31.1 g/dL (ref 30.0–36.0)
MCV: 91.5 fL (ref 80.0–100.0)
Platelets: 209 10*3/uL (ref 150–400)
RBC: 5.03 MIL/uL (ref 3.87–5.11)
RDW: 13.8 % (ref 11.5–15.5)
WBC: 4 10*3/uL (ref 4.0–10.5)
nRBC: 0 % (ref 0.0–0.2)

## 2021-12-14 LAB — URINALYSIS, ROUTINE W REFLEX MICROSCOPIC
Bilirubin Urine: NEGATIVE
Glucose, UA: >=500 mg/dL — AB
Ketones, ur: 80 mg/dL — AB
Nitrite: NEGATIVE
Protein, ur: 30 mg/dL — AB
Specific Gravity, Urine: 1.025 (ref 1.005–1.030)
pH: 5 (ref 5.0–8.0)

## 2021-12-14 LAB — CBG MONITORING, ED: Glucose-Capillary: 277 mg/dL — ABNORMAL HIGH (ref 70–99)

## 2021-12-14 LAB — BASIC METABOLIC PANEL
Anion gap: 17 — ABNORMAL HIGH (ref 5–15)
BUN: 11 mg/dL (ref 6–20)
CO2: 13 mmol/L — ABNORMAL LOW (ref 22–32)
Calcium: 9.2 mg/dL (ref 8.9–10.3)
Chloride: 106 mmol/L (ref 98–111)
Creatinine, Ser: 0.88 mg/dL (ref 0.44–1.00)
GFR, Estimated: >60 mL/min (ref >60)
Glucose, Bld: 266 mg/dL — ABNORMAL HIGH (ref 70–99)
Potassium: 3.5 mmol/L (ref 3.5–5.1)
Sodium: 136 mmol/L (ref 135–145)

## 2021-12-14 LAB — HEPATIC FUNCTION PANEL
ALT: 15 U/L (ref 0–44)
AST: 16 U/L (ref 15–41)
Albumin: 3.5 g/dL (ref 3.5–5.0)
Alkaline Phosphatase: 105 U/L (ref 38–126)
Bilirubin, Direct: <0.1 mg/dL (ref 0.0–0.2)
Total Bilirubin: 1.1 mg/dL (ref 0.3–1.2)
Total Protein: 7.5 g/dL (ref 6.5–8.1)

## 2021-12-14 LAB — I-STAT BETA HCG BLOOD, ED (MC, WL, AP ONLY): I-stat hCG, quantitative: <5 m[IU]/mL (ref <5)

## 2021-12-14 NOTE — ED Provider Triage Note (Signed)
Emergency Medicine Provider Triage Evaluation Note  Dawn [name] , a 21 y.o. female  was evaluated in triage.  Pt complains of hyperglycemia.  Reports she feels that her blood sugars currently all over the place, although she does have medication at home to take.  She is also endorsing some generalized abdominal pain with some nausea and vomiting however reports the nausea and vomiting has subsided.  Patient is currently living "here and there ",LMP beginning of november  Review of Systems  Positive: Abdominal pain, nausea, vomiting Negative: Fever, urinary symptotms  Physical Exam  BP 100/81   Pulse (!) 110   Temp 98 F (36.7 C)   Resp 18   Ht 5\' 2"  (1.575 m)   Wt 54.9 kg   LMP 11/03/2021   SpO2 100%   BMI 22.13 kg/m  Gen:   Awake, no distress   Resp:  Normal effort  MSK:   Moves extremities without difficulty  Other:  Abdomen is soft non tender to palpation.   Medical Decision Making  Medically screening exam initiated at 4:36 PM.  Appropriate orders placed.  Emagene Merfeld was informed that the remainder of the evaluation will be completed by another provider, this initial triage assessment does not replace that evaluation, and the importance of remaining in the ED until their evaluation is complete.     [name], PA-C 12/14/21 1643

## 2021-12-14 NOTE — ED Triage Notes (Addendum)
Pt arrived to ED via EMS w/ multiple complaints ( lower abd and back pain x 1 month, concerned about possible pregnancy d/t last period 1st week of Nov, breast, nipple and vaginal pain, concerned about hyperglycemia d/t not being able to afford dexcom to monitor CBG) Pt reported to EMS that she went to [facility] yesterday, they did nothing and discharged her. VSS w/ EMS, CBG 425  Pt now endorsing migraine and throat pain.  Pt now endorsing fatigue, nausea and that she needs a covid test d/t her mother is at [facility] w/ covid right now

## 2021-12-14 NOTE — ED Notes (Signed)
Pt reports she is homeless and is having difficulty obtaining needed supplies to check her blood sugar.

## 2021-12-14 NOTE — ED Notes (Signed)
Spo2 didn't initially work due to [employer] nails on pt.

## 2021-12-15 ENCOUNTER — Other Ambulatory Visit (HOSPITAL_COMMUNITY): Payer: Self-pay

## 2021-12-15 DIAGNOSIS — E1010 Type 1 diabetes mellitus with ketoacidosis without coma: Secondary | ICD-10-CM | POA: Diagnosis not present

## 2021-12-15 LAB — BETA-HYDROXYBUTYRIC ACID
Beta-Hydroxybutyric Acid: >8 mmol/L — ABNORMAL HIGH (ref 0.05–0.27)
Beta-Hydroxybutyric Acid: 4.53 mmol/L — ABNORMAL HIGH (ref 0.05–0.27)

## 2021-12-15 LAB — BASIC METABOLIC PANEL
Anion gap: 18 — ABNORMAL HIGH (ref 5–15)
Anion gap: 13 (ref 5–15)
Anion gap: 11 (ref 5–15)
BUN: 15 mg/dL (ref 6–20)
BUN: 11 mg/dL (ref 6–20)
BUN: 10 mg/dL (ref 6–20)
CO2: 12 mmol/L — ABNORMAL LOW (ref 22–32)
CO2: 15 mmol/L — ABNORMAL LOW (ref 22–32)
CO2: 18 mmol/L — ABNORMAL LOW (ref 22–32)
Calcium: 9.3 mg/dL (ref 8.9–10.3)
Calcium: 8.5 mg/dL — ABNORMAL LOW (ref 8.9–10.3)
Calcium: 8.2 mg/dL — ABNORMAL LOW (ref 8.9–10.3)
Chloride: 101 mmol/L (ref 98–111)
Chloride: 107 mmol/L (ref 98–111)
Chloride: 106 mmol/L (ref 98–111)
Creatinine, Ser: 1.1 mg/dL — ABNORMAL HIGH (ref 0.44–1.00)
Creatinine, Ser: 0.87 mg/dL (ref 0.44–1.00)
Creatinine, Ser: 0.79 mg/dL (ref 0.44–1.00)
GFR, Estimated: >60 mL/min (ref >60)
GFR, Estimated: >60 mL/min (ref >60)
GFR, Estimated: >60 mL/min (ref >60)
Glucose, Bld: 386 mg/dL — ABNORMAL HIGH (ref 70–99)
Glucose, Bld: 257 mg/dL — ABNORMAL HIGH (ref 70–99)
Glucose, Bld: 231 mg/dL — ABNORMAL HIGH (ref 70–99)
Potassium: 4.8 mmol/L (ref 3.5–5.1)
Potassium: 3.4 mmol/L — ABNORMAL LOW (ref 3.5–5.1)
Potassium: 3.3 mmol/L — ABNORMAL LOW (ref 3.5–5.1)
Sodium: 131 mmol/L — ABNORMAL LOW (ref 135–145)
Sodium: 135 mmol/L (ref 135–145)
Sodium: 135 mmol/L (ref 135–145)

## 2021-12-15 LAB — CBG MONITORING, ED
Glucose-Capillary: 208 mg/dL — ABNORMAL HIGH (ref 70–99)
Glucose-Capillary: 400 mg/dL — ABNORMAL HIGH (ref 70–99)

## 2021-12-15 LAB — WET PREP, GENITAL
Clue Cells Wet Prep HPF POC: NONE SEEN
Sperm: NONE SEEN
Trich, Wet Prep: NONE SEEN
WBC, Wet Prep HPF POC: <10 (ref <10)
Yeast Wet Prep HPF POC: NONE SEEN

## 2021-12-15 LAB — I-STAT VENOUS BLOOD GAS, ED
Acid-base deficit: 9 mmol/L — ABNORMAL HIGH (ref 0.0–2.0)
Bicarbonate: 15.4 mmol/L — ABNORMAL LOW (ref 20.0–28.0)
Calcium, Ion: 1.26 mmol/L (ref 1.15–1.40)
HCT: 33 % — ABNORMAL LOW (ref 36.0–46.0)
Hemoglobin: 11.2 g/dL — ABNORMAL LOW (ref 12.0–15.0)
O2 Saturation: 95 %
Potassium: 3.5 mmol/L (ref 3.5–5.1)
Sodium: 137 mmol/L (ref 135–145)
TCO2: 16 mmol/L — ABNORMAL LOW (ref 22–32)
pCO2, Ven: 29.5 mmHg — ABNORMAL LOW (ref 44–60)
pH, Ven: 7.326 (ref 7.25–7.43)
pO2, Ven: 82 mmHg — ABNORMAL HIGH (ref 32–45)

## 2021-12-15 LAB — GC/CHLAMYDIA PROBE AMP (~~LOC~~) NOT AT ARMC
Chlamydia: NEGATIVE
Comment: NEGATIVE
Comment: NORMAL
Neisseria Gonorrhea: NEGATIVE

## 2021-12-15 MED ORDER — DEXTROSE IN LACTATED RINGERS 5 % IV SOLN: INTRAVENOUS | Status: DC

## 2021-12-15 MED ORDER — LACTATED RINGERS IV BOLUS
1000.0000 mL | Freq: Once | INTRAVENOUS | Status: AC
  Administered 2021-12-15: 1000 mL via INTRAVENOUS

## 2021-12-15 MED ORDER — INSULIN GLARGINE-YFGN 100 UNIT/ML ~~LOC~~ SOLN
20.0000 [IU] | Freq: Every day | SUBCUTANEOUS | Status: DC
  Administered 2021-12-15: 20 [IU] via SUBCUTANEOUS
  Filled 2021-12-15: qty 0.2

## 2021-12-15 MED ORDER — INSULIN ASPART 100 UNIT/ML IJ SOLN
5.0000 [IU] | Freq: Once | INTRAMUSCULAR | Status: AC
  Administered 2021-12-15: 5 [IU] via SUBCUTANEOUS

## 2021-12-15 MED ORDER — POLYETHYLENE GLYCOL 3350 17 GM/SCOOP PO POWD
17.0000 g | Freq: Every day | ORAL | 0 refills | Status: DC
  Filled 2021-12-15: qty 238, 14d supply, fill #0

## 2021-12-15 MED ORDER — INSULIN REGULAR(HUMAN) IN NACL 100-0.9 UT/100ML-% IV SOLN: INTRAVENOUS | Status: DC

## 2021-12-15 MED ORDER — INSULIN ASPART 100 UNIT/ML IJ SOLN
0.0000 [IU] | INTRAMUSCULAR | Status: DC
  Administered 2021-12-15 (×2): 3 [IU] via SUBCUTANEOUS

## 2021-12-15 MED ORDER — POTASSIUM CHLORIDE 10 MEQ/100ML IV SOLN: 10.0000 meq | INTRAVENOUS | Status: DC

## 2021-12-15 MED ORDER — LACTATED RINGERS IV SOLN: INTRAVENOUS | Status: DC

## 2021-12-15 MED ORDER — POTASSIUM CHLORIDE CRYS ER 20 MEQ PO TBCR
40.0000 meq | EXTENDED_RELEASE_TABLET | Freq: Once | ORAL | Status: AC
  Administered 2021-12-15: 40 meq via ORAL
  Filled 2021-12-15: qty 2

## 2021-12-15 MED ORDER — SERTRALINE HCL 50 MG PO TABS
75.0000 mg | ORAL_TABLET | Freq: Every day | ORAL | Status: DC
  Administered 2021-12-15: 75 mg via ORAL
  Filled 2021-12-15: qty 2

## 2021-12-15 MED ORDER — DEXTROSE 50 % IV SOLN: 0.0000 mL | INTRAVENOUS | Status: DC | PRN

## 2021-12-15 MED ORDER — ENOXAPARIN SODIUM 40 MG/0.4ML IJ SOSY
40.0000 mg | PREFILLED_SYRINGE | INTRAMUSCULAR | Status: DC
  Filled 2021-12-15: qty 0.4

## 2021-12-15 NOTE — ED Notes (Signed)
"  Ready to go", snack given at d/c, steady gait, dressed self.

## 2021-12-15 NOTE — H&P (Signed)
Hospital Admission History and Physical Service Pager: [phone]  Patient name: Dawn [name] Medical record number: [id number] Date of Birth: [birth year] Age: 21 y.o. Gender: female  Primary Care Provider: Darral Dash, DO Consultants: None Code Status: Full Preferred Emergency Contact:  Contact Information     Name Relation Home Work Mobile   Roberts [phone]  [phone]      Chief Complaint: Abnormal glucose  Assessment and Plan: Dawn [name] is a 21 y.o. female presenting with DKA. Differential for underlying etiology of DKA includes medication non-adherence (most likely given repeated admissions for the same). Less likely infection given lack of fever, infectious symptoms or leukocytosis.  DKA (diabetic ketoacidosis) (HCC) Mild in severity. Type 1 diabetes with multiple prior admissions for DKA in the setting of poor medication adherence. Presented with serum glucose 277 which rose to 400, K 3.5, VBG pH 7.326 and bicarb 15.4, BHB>8, AG 17, UA glucosuria >500, ketones 80. S/p 2 L LR and 5u Aspart in ED. Home medications include: Lantus 20u BID and Humalog per sliding scale.  -Admit to FPTS, attending Dr. Pollie Meyer, Progressive unit -Diabetes coordinator consulted, appreciate recommendations -Anion gap closed x1, repeat pending -Will hold off on Endotool for now -Lantus 20 units -sSSI -CBG checks q4h -Carb modified diet -Electrolytes: trend BMP q4h, replete as indicated -Check A1c   Chronic and stable conditions: Depression: continue home Zoloft 75mg  daily Asthma: continue home albuterol prn   FEN/GI: carb modified diet VTE Prophylaxis: Lovenox  Disposition: Progressive  History of Present Illness:  Dawn [name] is a 21 y.o. female presenting with hyperglycemia and intermittent abdominal pain with concern for being in DKA.  Abdominal pain since November that comes and goes. She came to the hospital yesterday because her doctor called her and  told her to go to the ED because she may be going into DKA. Endorses nausea/vomiting on and off but not recently. Denies chest pain, SOB, urinary frequency. She has been excessively thirsty.   States her home insulin regimen includes [name] 40 units daily and sliding scale. States she last took her December yesterday, prior to that was the day before. Endorses compliance although with some missed doses in which she stopped taking it a couple days related to homelessness when she was between her two houses of step mom and aunt's houses. Endorses using her Humalog kwikpen 4-5x/day despite not having an acting Dexcom. She has been unable to check her sugars but has been dosing based on what she eats (10-15u). She is able to recite her sliding scale by memory (although it does not match what's prescribed), reports her sliding scale maxes at 6u based on sugars. She reportedly gives herself more than that because "I EAT AND SNACK".   For her period, she started spotting on 12/8. She is not attempting to get pregnant at this time and is not on any contraceptive measures  In the ED, received 2L LR and 5u of Novolog prior to initiation on Endotool. Further, pregnancy test negative.   Review Of Systems: Per HPI  Pertinent Past Medical History: T1DM with frequent admissions for DKA Migraines ADHD Depression Asthma Remainder reviewed in history tab.   Pertinent Past Surgical History: None pertinent  Remainder reviewed in history tab.   Pertinent Social History: Tobacco use: Denies Alcohol use: Denies Other Substance use: marijuana Lives between mother's and aunt's houses  Pertinent Family History: Father: heart disease Mother: asthma  Remainder reviewed in history tab.   Important Outpatient Medications: Albuterol inhaler  Lantus Dawn [name] per patient) 40u qam Humalog sliding scale Zoloft 75mg  Imitrex Albuterol inhaler Remainder reviewed in medication history.   Objective: BP 90/63    Pulse 79   Temp 98.6 F (37 C) (Oral)   Resp 14   Ht 5\' 2"  (1.575 m)   Wt 54.9 kg   LMP 11/03/2021   SpO2 100%   BMI 22.13 kg/m  Exam: General: alert, resting comfortably, NAD Eyes: normal sclera and conjunctiva ENTM: moist mucous membranes Neck: supple Cardiovascular: RRR, normal S1/S2 without m/r/g Respiratory: normal effort, lungs CTAB Gastrointestinal: soft, mild RLQ tenderness to palpation, no rebound or guarding Ext: no peripheral edema Derm: no obvious rashes Neuro: A&O x4, normal speech, no focal deficits Psych: pleasant, limited insight into her diabetes, overall appropriate mood and affect  Labs:  CBC BMET  Recent Labs  Lab 12/14/21 1630 12/15/21 0656  WBC 4.0  --   HGB 14.3 11.2*  HCT 46.0 33.0*  PLT 209  --    Recent Labs  Lab 12/15/21 0648 12/15/21 0656  NA 135 137  K 3.4* 3.5  CL 107  --   CO2 15*  --   BUN 11  --   CREATININE 0.87  --   GLUCOSE 257*  --   CALCIUM 8.5*  --     Pertinent additional labs: Anion gap 17>13 VBG: pH 7.326, bicarb 15.4  EKG: ordered & pending  Imaging Studies Performed: None  12/17/21, MD 12/15/2021, 8:42 AM PGY-3, [facility]  FPTS Intern pager: [phone], text pages welcome Secure chat group [facility] Teaching Service

## 2021-12-15 NOTE — ED Provider Notes (Signed)
Ultrasound ED Peripheral IV (Provider)  Date/Time: 12/15/2021 5:08 AM  Performed by: Smitty Knudsen, PA-C Authorized by: Smitty Knudsen, PA-C   Procedure details:    Indications: multiple failed IV attempts     Skin Prep: chlorhexidine gluconate     Location:  Right AC   Angiocath:  20 G   Bedside Ultrasound Guided: Yes     Images: archived     Patient tolerated procedure without complications: Yes     Dressing applied: Yes       Smitty Knudsen, PA-C 12/15/21 0509    Mesner, Barbara Cower, MD 12/16/21 9935

## 2021-12-15 NOTE — Discharge Summary (Signed)
[facility] Discharge Summary  Patient name: Dawn [name] Medical record number: [id number] Date of birth: 09/19/2000 Age: 21 y.o. Gender: female Date of Admission: 12/14/2021  Date of Discharge: 12/15/2021 Admitting Physician: Alcus Dad, MD  Primary Care Provider: Orvis Brill, DO Consultants: None  Indication for Hospitalization: Abdominal pain  Discharge Diagnoses/Problem List:  Principal Problem for Admission: DKA Other Problems addressed during stay:  Active Problems:   DKA (diabetic ketoacidosis) [facility])  Brief Hospital Course:  Dawn [name] is a 21 y.o.female with a history of T1DM, depression, asthma who was admitted to the [facility] Medicine Teaching Service at [facility] for DKA. Her hospital course is detailed below:  DKA 2/2 T1DM Presented to ED with abdominal discomfort concerning for hyperglycemia in the setting of lack of medication adherence.  Presented with CBG up to 400, AG 18, BHB greater than 8, glucosuria and ketonuria.  In the ED received 2 L LR bolus and 5 units NovoLog and was admitte.  She had a closed gap x 2 prior to being placed on Endo tool.  She was given Semglee 20 units and potassium repletion.  Patient was discharged home several hours after being admitted.  Additionally, she was provided MiraLAX for intermittent abdominal pain.  Contraceptive counseling Patient presented with concerns for pregnancy, negative pregnancy test in the ED.  She was counseled on contraceptive methods prior to being discharged and opted to not have Nexplanon placed at that time.  PCP Follow-up Recommendations:  Continue contraceptive discussion Recheck BMP Diabetic management  Disposition: Home  Discharge Condition: Stable  Discharge Exam:  Vitals:   12/15/21 1017 12/15/21 1100  BP:  116/74  Pulse:  80  Resp:  14  Temp: 98.4 F (36.9 C)   SpO2:  100%  General: alert, resting comfortably, NAD Eyes: normal sclera and  conjunctiva ENTM: moist mucous membranes Neck: supple Cardiovascular: RRR, normal S1/S2 without m/r/g Respiratory: normal effort, lungs CTAB Gastrointestinal: soft, mild RLQ tenderness to palpation, no rebound or guarding Ext: no peripheral edema Derm: no obvious rashes Neuro: A&O x4, normal speech, no focal deficits Psych: pleasant, limited insight into her diabetes, overall appropriate mood and affect  Significant Procedures: None  Significant Labs and Imaging:  Recent Labs  Lab 12/13/21 1304 12/14/21 1630 12/15/21 0656  WBC 6.2 4.0  --   HGB 13.2 14.3 11.2*  HCT 41.5 46.0 33.0*  PLT 197 209  --    Recent Labs  Lab 12/14/21 1630 12/15/21 0431 12/15/21 0648 12/15/21 0656 12/15/21 0851  NA 136 131* 135 137 135  K 3.5 4.8 3.4* 3.5 3.3*  CL 106 101 107  --  106  CO2 13* 12* 15*  --  18*  GLUCOSE 266* 386* 257*  --  231*  BUN _0 --  10  CREATININE 0.88 1.10* 0.87  --  0.79  CALCIUM 9.2 9.3 8.5*  --  8.2*  ALKPHOS 105  --   --   --   --   AST 16  --   --   --   --   ALT 15  --   --   --   --   ALBUMIN 3.5  --   --   --   --    Urine pregnancy: Negative  Results/Tests Pending at Time of Discharge: None  Discharge Medications:  Allergies as of 12/15/2021       Reactions   Latex Itching, Swelling, Other (See Comments)   Causes skin irritation  Tape Other (See Comments)   Causes skin irritation   Shellfish Allergy Itching, Rash, Other (See Comments)   Reaction to scallops        Medication List     STOP taking these medications    Lantus SoloStar 100 UNIT/ML Solostar Pen Generic drug: insulin glargine   potassium chloride SA 20 MEQ tablet Commonly known as: KLOR-CON M       TAKE these medications    Accu-Chek Guide test strip Generic drug: glucose blood Use to check glucose 6 times daily   Accu-Chek Guide w/Device Kit Use 3 (three) times daily before meals.   Accu-Chek Softclix Lancets lancets Please use to check blood sugar up to  6 times/day.   blood glucose meter kit and supplies Use up to four times daily as directed. (Dispense based on patient and insurance preference. Use up to four times daily as directed. (FOR ICD-10 E10.9, E11.9).)   Dexcom G6 Sensor Misc Replace sensor every 10 days.   HumaLOG KwikPen 100 UNIT/ML KwikPen Generic drug: insulin lispro Inject 5-15 Units into the skin 3 times daily with meals- per sliding scale based on carb count. (See admin instructions.) What changed:  how much to take additional instructions   Pentips 32G X 4 MM Misc Generic drug: Insulin Pen Needle USE TO INJECT INSULIN 6 TIMES DAILY   polyethylene glycol powder 17 GM/SCOOP powder Commonly known as: GLYCOLAX/MIRALAX Take 17 g by mouth daily.   sertraline 25 MG tablet Commonly known as: ZOLOFT Take 3 tablets (75 mg total) by mouth daily.   SUMAtriptan 25 MG tablet Commonly known as: Imitrex Take 0.5 tablets (12.5 mg total) by mouth every 2 (two) hours as needed for migraine. May repeat in 2 hours if headache persists or recurs.   [name] FlexTouch 100 UNIT/ML FlexTouch Pen Generic drug: insulin degludec Inject 40 Units into the skin daily.       Discharge Instructions: Please refer to Patient Instructions section of EMR for full details.  Patient was counseled important signs and symptoms that should prompt return to medical care, changes in medications, dietary instructions, activity restrictions, and follow up appointments.   Follow-Up Appointments:  Follow-up Information     Dameron, Marisa, DO. Go on 12/21/2021.   Specialty: Family Medicine Why: At 9:50 am. Please arrive by 9:35 am. This is your hospital follow up with your primary care doctor, Dr. Dameron. Contact information: [address] [place] [address] [phone]                Dahbura, Anton, DO 12/15/2021, 12:54 PM PGY-2, [facility]  

## 2021-12-15 NOTE — Hospital Course (Addendum)
Dawn [name] is a 21 y.o.female with a history of T1DM, depression, asthma who was admitted to the [facility] Medicine Teaching Service at [facility] for DKA. Her hospital course is detailed below:  DKA 2/2 T1DM Presented to ED with abdominal discomfort concerning for hyperglycemia in the setting of lack of medication adherence.  Presented with CBG up to 400, AG 18, BHB greater than 8, glucosuria and ketonuria.  In the ED received 2 L LR bolus and 5 units NovoLog and was admitte.  She had a closed gap x 2 prior to being placed on Endo tool.  She was given Semglee 20 units and potassium repletion.  Patient was discharged home several hours after being admitted.  Additionally, she was provided MiraLAX for intermittent abdominal pain.  Contraceptive counseling Patient presented with concerns for pregnancy, negative pregnancy test in the ED.  She was counseled on contraceptive methods prior to being discharged and opted to not have Nexplanon placed at that time.  PCP Follow-up Recommendations:  Continue contraceptive discussion Recheck BMP Diabetic management

## 2021-12-15 NOTE — ED Provider Notes (Signed)
Care of patient handed off to me at this time though milder found out, PA-C.  Briefly this is a 21 year old female who presented to the emergency department for hyperglycemia, abdominal pain, nausea and vomiting.  Has a history of type 1 diabetes.  Apparently has not been able to access her insulin. Physical Exam  BP 90/63   Pulse 79   Temp 98.6 F (37 C) (Oral)   Resp 14   Ht 5\' 2"  (1.575 m)   Wt 54.9 kg   LMP 11/03/2021   SpO2 100%   BMI 22.13 kg/m   Physical Exam See previous provider Procedures  Procedures  ED Course / MDM   Clinical Course as of 12/15/21 0726  Tue Dec 14, 2021  2105 Glucose(!): 266 [JS]  2105 Anion gap(!): 17 [JS]  2105 Ketones, ur(!): 80 [JS]  Wed Dec 15, 2021  0330 52month of lower abd pain, nipple/breast pain.   [WF]  0331 40 truciba  [WF]    Clinical Course User Index [JS] 2month, PA-C [WF] Claude Manges, [facility]   Medical Decision Making Amount and/or Complexity of Data Reviewed Labs: ordered.  Risk Prescription drug management. Decision regarding hospitalization.   At time of handoff pending callback from family medicine for admission.  He has been given 2 L of IV fluid as well as 5 units of NovoLog insulin.  She does have a anion gap, ketonuria, BHB is greater than 8 after intervention.  An insulin drip order was placed.    Spoke with Dr. Georgia, family medicine admission team, who agrees to admit patient.    Anner Crete, PA-C 12/15/21 0737    12/17/21, DO 12/15/21 [phone]

## 2021-12-15 NOTE — Assessment & Plan Note (Addendum)
Mild in severity. Type 1 diabetes with multiple prior admissions for DKA in the setting of poor medication adherence. Presented with serum glucose 277 which rose to 400, K 3.5, VBG pH 7.326 and bicarb 15.4, BHB>8, AG 17, UA glucosuria >500, ketones 80. S/p 2 L LR and 5u Aspart in ED. Home medications include: Lantus 20u BID and Humalog per sliding scale.  -Admit to FPTS, attending Dr. Pollie Meyer, Progressive unit -Diabetes coordinator consulted, appreciate recommendations -Anion gap closed x1, repeat pending -Will hold off on Endotool for now -Lantus 20 units -sSSI -CBG checks q4h -Carb modified diet -Electrolytes: trend BMP q4h, replete as indicated -Check A1c

## 2021-12-15 NOTE — Inpatient Diabetes Management (Signed)
Inpatient Diabetes Program Recommendations  AACE/ADA: New Consensus Statement on Inpatient Glycemic Control (2015)  Target Ranges:  Prepandial:   less than 140 mg/dL      Peak postprandial:   less than 180 mg/dL (1-2 hours)      Critically ill patients:  140 - 180 mg/dL   Lab Results  Component Value Date   GLUCAP 400 (H) 12/15/2021   HGBA1C >15.5 (H) 09/30/2021    Latest Reference Range & Units 12/07/21 17:35 12/14/21 16:29 12/15/21 00:49  Glucose-Capillary 70 - 99 mg/dL 662 (H) 947 (H) 654 (H)  (H): Data is abnormally high  Diabetes history: DM1 Outpatient Diabetes medications: [name] 40 units qd, Humalog 5-20 units tid meal coverage Current orders for Inpatient glycemic control: Semglee 20 units, Novolog 0-9 units q 4 hrs. correction  Inpatient Diabetes Program Recommendations:  DM coordinators very familiar with patient and discussed diabetes management and compliance with insulin on multiple occasions. Over the past 6 months: -5 inpatient admissions -3 emergency room visits  Thank you, Dawn [name]. Kayl Stogdill, RN, MSN, CDE  Diabetes Coordinator Inpatient Glycemic Control Team Team Pager [phone] (8am-5pm) 12/15/2021 11:30 AM

## 2021-12-15 NOTE — Discharge Instructions (Addendum)
Dear [name],  Thank you for letting us participate in your care. You were hospitalized for diabetic ketoacidosis (DKA).  He did not require IV insulin but you were given insulin and fluids to bring your labs and sugar back to normal.  You were discharged the same day.  Additionally, the ED evaluated you for abnormal vaginal discharge. Your vaginal swab was negative for BV, yeast, and trichomonas infection. Your gonorrhea and chlamydia tests came back **negative**.  We also recommend continuing the conversation with your PCP regarding contraceptive management as getting pregnant in situation of difficult to control diabetes with significantly complicate your medical condition and should be plan for accordingly.  POST-HOSPITAL & CARE INSTRUCTIONS Go to your follow up appointments (listed below)  DOCTOR'S APPOINTMENT   Future Appointments  Date Time Provider Department Center  12/21/2021  9:50 AM Darral Dash, DO [facility]  12/23/2021 10:20 AM [facility] [place] 3 [facility]  12/23/2021 10:25 AM [facility] [place] 3 [facility]  01/06/2022 10:15 AM Lorriane Shire, MD [facility]    Follow-up Information     Dameron, Nolberto Hanlon, DO. Go on 12/21/2021.   Specialty: Family Medicine Why: At 9:50 am. Please arrive by 9:35 am. This is your hospital follow up with your primary care doctor, Dr. Melissa Noon. Contact information: [address] [place] [address] [phone]               Take care and be well!  Family Medicine Teaching Service Inpatient Team   [facility]  [address] [place], [place] [address] [phone]

## 2021-12-15 NOTE — ED Notes (Signed)
Kim in lab adding on A1C.

## 2021-12-15 NOTE — ED Provider Notes (Signed)
[facility] EMERGENCY DEPARTMENT Provider Note   CSN: [id number] Arrival date & time: 12/14/21  1615     History  Chief Complaint  Patient presents with   Multiple Complaints    Dawn [name] is a 21 y.o. female.  HPI  Patient is a 21 year old female with a past medical history significant for DKA and history of noncompliance with her insulin  She is present emergency room today with complaints of 1 month of pelvic pain.  She states that she frequently misses her insulin doses.  She states that she has recently has issues with her blood sugars being all over the place but does not also inform her that she has not been taking her insulin regularly.  She states that she has some generalized abdominal pain but seems to be more in the pelvic area.  She says she had some white vaginal discharge over the past few days.  She has unprotected sex with 1 female partner.  She does not use barrier protection.  Last menstrual period was in November.  She states that she has had some episodes of nausea vomiting but none over the past few days.  Nonbloody nonbilious emesis.   She denies chest pain or difficulty breathing.    Home Medications Prior to Admission medications   Medication Sig Start Date End Date Taking? Authorizing Provider  Accu-Chek Softclix Lancets lancets Please use to check blood sugar up to 6 times/day. 08/05/21   August Albino, MD  blood glucose meter kit and supplies Dispense based on patient and insurance preference. Use up to four times daily as directed. (FOR ICD-10 E10.9, E11.9). 08/05/21   August Albino, MD  Blood Glucose Monitoring Suppl (ACCU-CHEK GUIDE ME) w/Device KIT Use 3 (three) times daily before meals. 08/05/21   August Albino, MD  Continuous Blood Gluc Sensor (DEXCOM G6 SENSOR) MISC Replace sensor every 10 days. 09/02/21   Sharion Settler, DO  glucose blood (ACCU-CHEK GUIDE) test strip Use to check glucose 6 times daily 08/05/21   August Albino, MD   insulin glargine (LANTUS SOLOSTAR) 100 UNIT/ML Solostar Pen Inject 20 Units into the skin 2 (two) times daily. 10/19/21   Barb Merino, MD  insulin lispro (HUMALOG KWIKPEN) 100 UNIT/ML KwikPen Inject 5-15 Units into the skin 3 times daily with meals- per sliding scale based on carb count. (See admin instructions.) Patient taking differently: Inject 5-20 Units into the skin See admin instructions. Inject 5-20 units into the skin three times a day with meals, per sliding scale 08/05/21 10/18/21  August Albino, MD  Insulin Pen Needle (BD PEN NEEDLE NANO 2ND GEN) 32G X 4 MM MISC USE TO INJECT INSULIN 6 TIMES DAILY 08/05/21   August Albino, MD  potassium chloride SA (KLOR-CON M) 20 MEQ tablet Take 2 tablets (40 mEq total) by mouth daily for 3 days. 10/19/21 10/22/21  Barb Merino, MD  sertraline (ZOLOFT) 25 MG tablet Take 3 tablets (75 mg total) by mouth daily. Patient not taking: Reported on 10/18/2021 09/03/21   Sharion Settler, DO  SUMAtriptan (IMITREX) 25 MG tablet Take 0.5 tablets (12.5 mg total) by mouth every 2 (two) hours as needed for migraine. May repeat in 2 hours if headache persists or recurs. Patient not taking: Reported on 10/18/2021 04/12/21   [name], MD      Allergies    Latex, Tape, and Shellfish allergy    Review of Systems   Review of Systems  Physical Exam Updated Vital Signs BP 90/63   Pulse  79   Temp 98.6 F (37 C) (Oral)   Resp 14   Ht _0  (1.575 m)   Wt 54.9 kg   LMP 11/03/2021   SpO2 100%   BMI 22.13 kg/m  Physical Exam Vitals and nursing note reviewed.  Constitutional:      General: She is not in acute distress. HENT:     Head: Normocephalic and atraumatic.     Nose: Nose normal.     Mouth/Throat:     Mouth: Mucous membranes are dry.  Eyes:     General: No scleral icterus. Cardiovascular:     Rate and Rhythm: Normal rate and regular rhythm.     Pulses: Normal pulses.     Heart sounds: Normal heart sounds.  Pulmonary:     Effort: Pulmonary  effort is normal. No respiratory distress.     Breath sounds: No wheezing.  Abdominal:     Palpations: Abdomen is soft.     Tenderness: There is no abdominal tenderness. There is no guarding or rebound.  Genitourinary:    Comments: Vulva without lesions or abnormality Vaginal canal without abnormal discharge or lesion Cervix appears normal, is closed No adnexal tenderness or CMT Musculoskeletal:     Cervical back: Normal range of motion.     Right lower leg: No edema.     Left lower leg: No edema.  Skin:    General: Skin is warm and dry.     Capillary Refill: Capillary refill takes less than 2 seconds.  Neurological:     Mental Status: She is alert. Mental status is at baseline.  Psychiatric:        Mood and Affect: Mood normal.        Behavior: Behavior normal.     ED Results / Procedures / Treatments   Labs (all labs ordered are listed, but only abnormal results are displayed) Labs Reviewed  BASIC METABOLIC PANEL - Abnormal; Notable for the following components:      Result Value   CO2 13 (*)    Glucose, Bld 266 (*)    Anion gap 17 (*)    All other components within normal limits  URINALYSIS, ROUTINE W REFLEX MICROSCOPIC - Abnormal; Notable for the following components:   APPearance HAZY (*)    Glucose, UA >=500 (*)    Hgb urine dipstick MODERATE (*)    Ketones, ur 80 (*)    Protein, ur 30 (*)    Leukocytes,Ua TRACE (*)    Bacteria, UA RARE (*)    All other components within normal limits  BASIC METABOLIC PANEL - Abnormal; Notable for the following components:   Sodium 131 (*)    CO2 12 (*)    Glucose, Bld 386 (*)    Creatinine, Ser 1.10 (*)    Anion gap 18 (*)    All other components within normal limits  BETA-HYDROXYBUTYRIC ACID - Abnormal; Notable for the following components:   Beta-Hydroxybutyric Acid >8.00 (*)    All other components within normal limits  BASIC METABOLIC PANEL - Abnormal; Notable for the following components:   Potassium 3.4 (*)    CO2  15 (*)    Glucose, Bld 257 (*)    Calcium 8.5 (*)    All other components within normal limits  CBG MONITORING, ED - Abnormal; Notable for the following components:   Glucose-Capillary 277 (*)    All other components within normal limits  CBG MONITORING, ED - Abnormal; Notable for the following components:  Glucose-Capillary 400 (*)    All other components within normal limits  I-STAT VENOUS BLOOD GAS, ED - Abnormal; Notable for the following components:   pCO2, Ven 29.5 (*)    pO2, Ven 82 (*)    Bicarbonate 15.4 (*)    TCO2 16 (*)    Acid-base deficit 9.0 (*)    HCT 33.0 (*)    Hemoglobin 11.2 (*)    All other components within normal limits  WET PREP, GENITAL  CBC  HEPATIC FUNCTION PANEL  BETA-HYDROXYBUTYRIC ACID  BASIC METABOLIC PANEL  BASIC METABOLIC PANEL  BASIC METABOLIC PANEL  BASIC METABOLIC PANEL  BASIC METABOLIC PANEL  I-STAT BETA HCG BLOOD, ED ([facility], [facility], [facility] ONLY)  CBG MONITORING, ED  CBG MONITORING, ED  GC/CHLAMYDIA PROBE AMP ([place]) NOT AT [facility]    EKG None  Radiology No results found.  Procedures Procedures    Medications Ordered in ED Medications  insulin regular, human (MYXREDLIN) 100 units/ 100 mL infusion (has no administration in time range)  dextrose 5 % in lactated ringers infusion (has no administration in time range)  dextrose 50 % solution 0-50 mL (has no administration in time range)  potassium chloride 10 mEq in 100 mL IVPB (has no administration in time range)  lactated ringers infusion (has no administration in time range)  lactated ringers bolus 1,000 mL (0 mLs Intravenous Stopped 12/15/21 0641)  lactated ringers bolus 1,000 mL (0 mLs Intravenous Stopped 12/15/21 0641)  insulin aspart (novoLOG) injection 5 Units (5 Units Subcutaneous Given 12/15/21 0445)    ED Course/ Medical Decision Making/ A&P Clinical Course as of 12/15/21 0742  Tue Dec 14, 2021  2105 Glucose(!): 266 [JS]  2105 Anion gap(!): 17 [JS]  2105 Ketones, ur(!):  80 [JS]  Wed Dec 15, 2021  0330 36monthof lower abd pain, nipple/breast pain.   [WF]  0331 40 truciba  [WF]  0739 Beta-Hydroxybutyric Acid(!): >8.00 [WF]    Clinical Course User Index [JS] SJaneece Fitting PA-C [WF] FTedd Sias PUtah                          Medical Decision Making Amount and/or Complexity of Data Reviewed Labs: ordered.  Risk Prescription drug management. Decision regarding hospitalization.   This patient presents to the ED for concern of NV pelvic pain, this involves a number of treatment options, and is a complaint that carries with it a moderate to high risk of complications and morbidity. A differential diagnosis was considered for the patient's symptoms which is discussed below:   The emergent differential diagnosis for vomiting includes, but is not limited to ACS/MI, Boerhaave's, DKA, Intracranial Hemorrhage, Ischemic bowel, Meningitis, Sepsis, Acute gastric dilation, Acetaminophen toxicity, Adrenal insufficiency, Appendicitis, Aspirin toxicity, Bowel obstruction/ileus, Cholecystitis, CNS tumor. Electrolyte abnormalities, Elevated ICP, Gastric outlet obstruction, Hyperemesis gravidarum, Pancreatitis, Peritonitis, Ruptured viscus, Testicular torsion/ovarian torsion, Biliary colic, Cannabinoid hyperemesis syndrome, Disulfiram effect, ETOH, Gastritis, Gastroenteritis, Gastroparesis, Hepatitis, Ibuprofen, Labyrinthitis, Migraine, Motion sickness, Narcotic withdrawal, Thyroid, Pregnancy, Peptic ulcer disease, Renal colic, and UTI    Co morbidities: Discussed in HPI   Brief History:  Patient is a 21year old female with a past medical history significant for DKA and history of noncompliance with her insulin  She is present emergency room today with complaints of 1 month of pelvic pain.  She states that she frequently misses her insulin doses.  She states that she has recently has issues with her blood sugars being all over the  place but does not also inform her  that she has not been taking her insulin regularly.  She states that she has some generalized abdominal pain but seems to be more in the pelvic area.  She says she had some white vaginal discharge over the past few days.  She has unprotected sex with 1 female partner.  She does not use barrier protection.  Last menstrual period was in November.  She states that she has had some episodes of nausea vomiting but none over the past few days.  Nonbloody nonbilious emesis.   She denies chest pain or difficulty breathing.    EMR reviewed including pt PMHx, past surgical history and past visits to ER.   See HPI for more details   Lab Tests:   I ordered and independently interpreted labs. Labs notable for Early DKA unfortunately patient was in the waiting room for extended period of time and has had  Imaging Studies:  No imaging studies ordered for this patient    Cardiac Monitoring:  The patient was maintained on a cardiac monitor.  I personally viewed and interpreted the cardiac monitored which showed an underlying rhythm of: NSR EKG non-ischemic   Medicines ordered:  I ordered medication including lactated Ringer's 2 L, insulin for hyperglycemia Reevaluation of the patient after these medicines showed that the patient improved I have reviewed the patients home medicines and have made adjustments as needed   Critical Interventions:     Consults/Attending Physician   I discussed this case with my attending physician who cosigned this note including patient's presenting symptoms, physical exam, and planned diagnostics and interventions. Attending physician stated agreement with plan or made changes to plan which were implemented.   Reevaluation:  After the interventions noted above I re-evaluated patient and found that they have :improved   Social Determinants of Health:      Problem List / ED Course:  DKA likely due to non-compliance  Hyperglycemia  Pelvic pain  doubt PID normal pelvic exam.   Dispostion:  After consideration of the diagnostic results and the patients response to treatment, I feel that the patent would benefit from    Final Clinical Impression(s) / ED Diagnoses Final diagnoses:  Diabetic ketoacidosis without coma associated with type 1 diabetes mellitus [facility])    Rx / [place] Orders ED Discharge Orders     None         Tedd Sias, [place] 12/15/21 0744    Merrily Pew, MD 12/16/21 0007

## 2021-12-15 NOTE — ED Notes (Signed)
Confirmed with Dr. Kallie Edward pt is ready for d/c and does not need further labs drawn.

## 2021-12-15 NOTE — Progress Notes (Signed)
Family medicine teaching service will be admitting this patient. Our pager information can be located in the physician sticky notes, treatment team sticky notes, and the headers of all our official daily progress notes.   FAMILY MEDICINE TEACHING SERVICE Patient - Please contact intern pager [phone] or text page via website [email or web address] (login: [email or web address]) for questions regarding care. DO NOT page listed attending provider unless there is no answer from the number above.   Kharee Lesesne, MD PGY-3,  Family Medicine Service pager [phone]   

## 2021-12-15 NOTE — Progress Notes (Signed)
Provider at bedside and was able to successfully insert US guided PIV. No further PIV needed at this time.

## 2021-12-16 ENCOUNTER — Telehealth: Payer: Self-pay

## 2021-12-16 LAB — HEMOGLOBIN A1C
Hgb A1c MFr Bld: 14.6 % — ABNORMAL HIGH (ref 4.8–5.6)
Mean Plasma Glucose: 372 mg/dL

## 2021-12-16 NOTE — Telephone Encounter (Signed)
Transition Care Management Unsuccessful Follow-up Telephone Call  Date of discharge and from where:  [facility] 12/15/2021  Attempts:  1st Attempt  Reason for unsuccessful TCM follow-up call:  Left voice message Karena Addison, LPN [facility] Nurse Health Advisor Direct Dial [phone]

## 2021-12-17 NOTE — Telephone Encounter (Signed)
Transition Care Management Follow-up Telephone Call Date of discharge and from where: [facility] 12/15/2021 How have you been since you were released from the hospital? good Any questions or concerns? No  Items Reviewed: Did the pt receive and understand the discharge instructions provided? Yes  Medications obtained and verified? Yes  Other? No  Any new allergies since your discharge? No  Dietary orders reviewed? Yes Do you have support at home? Yes   Home Care and Equipment/Supplies: Were home health services ordered? no If so, what is the name of the agency? N/a  Has the agency set up a time to come to the patient's home? not applicable Were any new equipment or medical supplies ordered?  No What is the name of the medical supply agency? N/a Were you able to get the supplies/equipment? not applicable Do you have any questions related to the use of the equipment or supplies? No  Functional Questionnaire: (I = Independent and D = Dependent) ADLs: I  Bathing/Dressing- I  Meal Prep- I  Eating- I  Maintaining continence- I  Transferring/Ambulation- I  Managing Meds- I  Follow up appointments reviewed:  PCP Hospital f/u appt confirmed? Yes  Scheduled to see Dr Melissa Noon on 12/21/2021 @ 9:50. Specialist Hospital f/u appt confirmed? No   Are transportation arrangements needed? No  If their condition worsens, is the pt aware to call PCP or go to the Emergency Dept.? Yes Was the patient provided with contact information for the PCP's office or ED? Yes Was to pt encouraged to call back with questions or concerns? Yes Karena Addison, LPN [facility] Nurse Health Advisor Direct Dial [phone]

## 2021-12-21 ENCOUNTER — Ambulatory Visit: Payer: Self-pay | Admitting: Student

## 2021-12-23 ENCOUNTER — Other Ambulatory Visit: Payer: Medicaid Other

## 2022-01-06 ENCOUNTER — Ambulatory Visit (INDEPENDENT_AMBULATORY_CARE_PROVIDER_SITE_OTHER): Payer: Medicaid Other | Admitting: Obstetrics and Gynecology

## 2022-01-06 ENCOUNTER — Other Ambulatory Visit (HOSPITAL_COMMUNITY)
Admission: RE | Admit: 2022-01-06 | Discharge: 2022-01-06 | Disposition: A | Discharge status: Home / Self Care | Payer: Medicaid Other | Source: Ambulatory Visit | Attending: Obstetrics and Gynecology | Admitting: Obstetrics and Gynecology

## 2022-01-06 ENCOUNTER — Other Ambulatory Visit: Payer: Self-pay

## 2022-01-06 ENCOUNTER — Encounter: Payer: Self-pay | Admitting: Obstetrics and Gynecology

## 2022-01-06 VITALS — Ht 61.25 in | Wt 116.2 lb

## 2022-01-06 DIAGNOSIS — Z3202 Encounter for pregnancy test, result negative: Secondary | ICD-10-CM | POA: Diagnosis not present

## 2022-01-06 DIAGNOSIS — N914 Secondary oligomenorrhea: Secondary | ICD-10-CM

## 2022-01-06 DIAGNOSIS — N898 Other specified noninflammatory disorders of vagina: Secondary | ICD-10-CM | POA: Diagnosis not present

## 2022-01-06 LAB — POCT PREGNANCY, URINE: Preg Test, Ur: NEGATIVE
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
## 2022-01-07 LAB — CERVICOVAGINAL ANCILLARY ONLY
Candida Glabrata: POSITIVE — AB
Candida Vaginitis: POSITIVE — AB
Chlamydia: NEGATIVE
Comment: NEGATIVE
Comment: NEGATIVE
Comment: NEGATIVE
Comment: NEGATIVE
Comment: NORMAL
Neisseria Gonorrhea: NEGATIVE
Trichomonas: NEGATIVE

## 2022-01-07 LAB — PROLACTIN: Prolactin: 3.1 ng/mL — ABNORMAL LOW (ref 4.8–33.4)

## 2022-01-07 LAB — TSH RFX ON ABNORMAL TO FREE T4: TSH: 1.62 u[IU]/mL (ref 0.450–4.500)

## 2022-01-10 ENCOUNTER — Other Ambulatory Visit: Payer: Self-pay | Admitting: Obstetrics and Gynecology

## 2022-01-10 DIAGNOSIS — B3731 Acute candidiasis of vulva and vagina: Secondary | ICD-10-CM

## 2022-01-10 MED ORDER — FLUCONAZOLE 150 MG PO TABS: 150.0000 mg | ORAL_TABLET | Freq: Once | ORAL | 3 refills | Status: AC

## 2022-01-10 MED ORDER — BORIC ACID CRYS: 600.0000 mg | CRYSTALS | Freq: Every day | 2 refills | Status: AC

## 2022-01-31 ENCOUNTER — Other Ambulatory Visit: Payer: Self-pay | Admitting: Nurse Practitioner

## 2022-01-31 DIAGNOSIS — R109 Unspecified abdominal pain: Secondary | ICD-10-CM

## 2022-02-02 ENCOUNTER — Other Ambulatory Visit: Payer: Medicaid Other

## 2022-02-02 ENCOUNTER — Inpatient Hospital Stay: Admission: RE | Admit: 2022-02-02 | Payer: Medicaid Other | Source: Ambulatory Visit

## 2022-02-04 ENCOUNTER — Other Ambulatory Visit: Payer: Self-pay

## 2022-02-04 ENCOUNTER — Encounter (HOSPITAL_COMMUNITY): Payer: Self-pay

## 2022-02-04 ENCOUNTER — Emergency Department (HOSPITAL_COMMUNITY)
Admission: EM | Admit: 2022-02-04 | Discharge: 2022-02-04 | Disposition: A | Discharge status: Home / Self Care | Payer: Medicaid Other | Attending: Emergency Medicine | Admitting: Emergency Medicine

## 2022-02-04 DIAGNOSIS — E1010 Type 1 diabetes mellitus with ketoacidosis without coma: Secondary | ICD-10-CM | POA: Diagnosis not present

## 2022-02-04 DIAGNOSIS — Z91040 Latex allergy status: Secondary | ICD-10-CM | POA: Insufficient documentation

## 2022-02-04 DIAGNOSIS — J45909 Unspecified asthma, uncomplicated: Secondary | ICD-10-CM | POA: Diagnosis not present

## 2022-02-04 DIAGNOSIS — E1065 Type 1 diabetes mellitus with hyperglycemia: Secondary | ICD-10-CM | POA: Insufficient documentation

## 2022-02-04 DIAGNOSIS — R1012 Left upper quadrant pain: Secondary | ICD-10-CM | POA: Diagnosis present

## 2022-02-04 DIAGNOSIS — Z794 Long term (current) use of insulin: Secondary | ICD-10-CM | POA: Diagnosis not present

## 2022-02-04 LAB — CBC WITH DIFFERENTIAL/PLATELET
Abs Immature Granulocytes: 0.01 10*3/uL (ref 0.00–0.07)
Basophils Absolute: 0 10*3/uL (ref 0.0–0.1)
Basophils Relative: 1 %
Eosinophils Absolute: 0.1 10*3/uL (ref 0.0–0.5)
Eosinophils Relative: 1 %
HCT: 36.7 % (ref 36.0–46.0)
Hemoglobin: 11.7 g/dL — ABNORMAL LOW (ref 12.0–15.0)
Immature Granulocytes: 0 %
Lymphocytes Relative: 49 %
Lymphs Abs: 3.2 10*3/uL (ref 0.7–4.0)
MCH: 28.1 pg (ref 26.0–34.0)
MCHC: 31.9 g/dL (ref 30.0–36.0)
MCV: 88.2 fL (ref 80.0–100.0)
Monocytes Absolute: 0.5 10*3/uL (ref 0.1–1.0)
Monocytes Relative: 7 %
Neutro Abs: 2.7 10*3/uL (ref 1.7–7.7)
Neutrophils Relative %: 42 %
Platelets: 247 10*3/uL (ref 150–400)
RBC: 4.16 MIL/uL (ref 3.87–5.11)
RDW: 14.6 % (ref 11.5–15.5)
WBC: 6.5 10*3/uL (ref 4.0–10.5)
nRBC: 0 % (ref 0.0–0.2)

## 2022-02-04 LAB — COMPREHENSIVE METABOLIC PANEL
ALT: 17 U/L (ref 0–44)
AST: 16 U/L (ref 15–41)
Albumin: 3.5 g/dL (ref 3.5–5.0)
Alkaline Phosphatase: 103 U/L (ref 38–126)
Anion gap: 12 (ref 5–15)
BUN: 22 mg/dL — ABNORMAL HIGH (ref 6–20)
CO2: 20 mmol/L — ABNORMAL LOW (ref 22–32)
Calcium: 9.4 mg/dL (ref 8.9–10.3)
Chloride: 99 mmol/L (ref 98–111)
Creatinine, Ser: 0.57 mg/dL (ref 0.44–1.00)
GFR, Estimated: >60 mL/min (ref >60)
Glucose, Bld: 321 mg/dL — ABNORMAL HIGH (ref 70–99)
Potassium: 4.1 mmol/L (ref 3.5–5.1)
Sodium: 131 mmol/L — ABNORMAL LOW (ref 135–145)
Total Bilirubin: 0.6 mg/dL (ref 0.3–1.2)
Total Protein: 7.2 g/dL (ref 6.5–8.1)

## 2022-02-04 LAB — I-STAT VENOUS BLOOD GAS, ED
Acid-base deficit: 4 mmol/L — ABNORMAL HIGH (ref 0.0–2.0)
Bicarbonate: 19.8 mmol/L — ABNORMAL LOW (ref 20.0–28.0)
Calcium, Ion: 1.19 mmol/L (ref 1.15–1.40)
HCT: 36 % (ref 36.0–46.0)
Hemoglobin: 12.2 g/dL (ref 12.0–15.0)
O2 Saturation: 100 %
Potassium: 3.9 mmol/L (ref 3.5–5.1)
Sodium: 132 mmol/L — ABNORMAL LOW (ref 135–145)
TCO2: 21 mmol/L — ABNORMAL LOW (ref 22–32)
pCO2, Ven: 31.4 mmHg — ABNORMAL LOW (ref 44–60)
pH, Ven: 7.408 (ref 7.25–7.43)
pO2, Ven: 198 mmHg — ABNORMAL HIGH (ref 32–45)

## 2022-02-04 LAB — URINALYSIS, ROUTINE W REFLEX MICROSCOPIC
Bacteria, UA: NONE SEEN
Bilirubin Urine: NEGATIVE
Glucose, UA: >=500 mg/dL — AB
Hgb urine dipstick: NEGATIVE
Ketones, ur: 20 mg/dL — AB
Leukocytes,Ua: NEGATIVE
Nitrite: NEGATIVE
Protein, ur: NEGATIVE mg/dL
Specific Gravity, Urine: 1.025 (ref 1.005–1.030)
pH: 5 (ref 5.0–8.0)

## 2022-02-04 LAB — I-STAT BETA HCG BLOOD, ED (MC, WL, AP ONLY): I-stat hCG, quantitative: <5 m[IU]/mL (ref <5)

## 2022-02-04 LAB — CBG MONITORING, ED
Glucose-Capillary: 338 mg/dL — ABNORMAL HIGH (ref 70–99)
Glucose-Capillary: 97 mg/dL (ref 70–99)

## 2022-02-04 LAB — LIPASE, BLOOD: Lipase: 69 U/L — ABNORMAL HIGH (ref 11–51)

## 2022-02-04 MED ORDER — ACETAMINOPHEN 500 MG PO TABS
1000.0000 mg | ORAL_TABLET | Freq: Once | ORAL | Status: AC
  Administered 2022-02-04: 1000 mg via ORAL
  Filled 2022-02-04: qty 2

## 2022-02-04 MED ORDER — INSULIN ASPART 100 UNIT/ML IJ SOLN: 0.0000 [IU] | Freq: Once | INTRAMUSCULAR | Status: DC

## 2022-02-04 MED ORDER — LACTATED RINGERS IV BOLUS
1000.0000 mL | Freq: Once | INTRAVENOUS | Status: AC
  Administered 2022-02-04: 1000 mL via INTRAVENOUS

## 2022-02-04 MED ORDER — KETOROLAC TROMETHAMINE 15 MG/ML IJ SOLN
15.0000 mg | Freq: Once | INTRAMUSCULAR | Status: AC
  Administered 2022-02-04: 15 mg via INTRAVENOUS
  Filled 2022-02-04: qty 1

## 2022-02-04 MED ORDER — FAMOTIDINE 20 MG PO TABS: 20.0000 mg | ORAL_TABLET | Freq: Two times a day (BID) | ORAL | 0 refills | Status: DC

## 2022-02-04 MED ORDER — PANTOPRAZOLE SODIUM 40 MG IV SOLR
40.0000 mg | Freq: Once | INTRAVENOUS | Status: AC
  Administered 2022-02-04: 40 mg via INTRAVENOUS
  Filled 2022-02-04: qty 10

## 2022-02-04 NOTE — Discharge Instructions (Addendum)
Thank you for coming to [facility] Emergency Department. You were seen for abdominal pain. We did an exam, labs, and imaging, and these showed no acute findings. Your glucose was slightly high but you weren't in DKA. Please continue to take your insulin at home. You can take pepcid 20 mg twice per day for possible gastritis or reflux symptoms. Please follow up with your primary care provider on Tuesday as scheduled.  Do not hesitate to return to the ED or call 911 if you experience: -Worsening symptoms -Nausea/vomiting so severe you cannot eat/drink anything -Lightheadedness, passing out -Fevers/chills -Anything else that concerns you

## 2022-02-04 NOTE — Inpatient Diabetes Management (Signed)
Inpatient Diabetes Program Recommendations  AACE/ADA: New Consensus Statement on Inpatient Glycemic Control (2015)  Target Ranges:  Prepandial:   less than 140 mg/dL      Peak postprandial:   less than 180 mg/dL (1-2 hours)      Critically ill patients:  140 - 180 mg/dL   Lab Results  Component Value Date   GLUCAP 338 (H) 02/04/2022   HGBA1C 14.6 (H) 12/14/2021    Review of Glycemic Control  Latest Reference Range & Units 02/04/22 11:21  Glucose-Capillary 70 - 99 mg/dL 338 (H)   Diabetes history: DM1 Outpatient Diabetes medications: [name] 40 units qd, Humalog 5-20 units tid meal coverage Current orders for Inpatient glycemic control:  None being evaluated in the ED  Note pt is very familiar to our team with multiple admissions and ED visits in the last 6 months. We have discussed diabetes management and compliance on multiple occasions.  A1c 14.6% on 12/14/21  Inpatient Diabetes Program Recommendations:    -  Add Semglee 25 units -  Novolog 0-9 units Q4 hours  Thanks, Tama Headings RN, MSN, BC-ADM Inpatient Diabetes Coordinator Team Pager [phone] (8a-5p)

## 2022-02-04 NOTE — ED Notes (Signed)
Patient provided sandwich per provider.

## 2022-02-04 NOTE — ED Notes (Signed)
Pt was stuck three times. Wasn't successful. Try again in the back.
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
## 2022-02-04 NOTE — ED Provider Triage Note (Signed)
Emergency Medicine Provider Triage Evaluation Note  Dawn [name] , a 22 y.o. female  was evaluated in triage.  Pt complains of left upper quadrant abdominal pain.  Patient reports symptoms beginning this past Wednesday.  Denies fever, nausea, vomiting, urinary symptoms, change in bowel habits.  Patient states that she has been having some milky white vaginal discharge is Wednesday as well.  Reports going to her primary care provider and being told that her pancreas was possibly inflamed; no note from internal medicine noted since prior hospitalization.  Reports blood glucose being unreadable by glucometer.  Review of Systems  Positive: See above Negative:   Physical Exam  BP 122/85 (BP Location: Right Arm)   Pulse (!) 110   Temp 98 F (36.7 C) (Oral)   Resp 17   Ht 5' 1.5" (1.562 m)   LMP 11/25/2021 (Exact Date)   SpO2 99%   BMI 21.60 kg/m  Gen:   Awake, no distress   Resp:  Normal effort  MSK:   Moves extremities without difficulty  Other:  Minimal abdominal tenderness.  No CVA tenderness.  Medical Decision Making  Medically screening exam initiated at 9:28 AM.  Appropriate orders placed.  Dawn [name] was informed that the remainder of the evaluation will be completed by another provider, this initial triage assessment does not replace that evaluation, and the importance of remaining in the ED until their evaluation is complete.     Wilnette Kales, [place] 02/04/22 [phone]

## 2022-02-04 NOTE — ED Triage Notes (Signed)
Pt arrived POV from home c/o LUQ abdominal pain that started on Wednesday. Pt states her PCP told her something was going on with her pancreas. Pt also states she is a type 1 diabetic. Pt also states she is having some milky white vaginal discharge.

## 2022-02-14 ENCOUNTER — Other Ambulatory Visit: Payer: Medicaid Other

## 2022-02-17 ENCOUNTER — Other Ambulatory Visit: Payer: Self-pay

## 2022-02-17 ENCOUNTER — Observation Stay (HOSPITAL_COMMUNITY)
Admission: EM | Admit: 2022-02-17 | Discharge: 2022-02-19 | Disposition: A | Discharge status: Home / Self Care | Payer: Medicaid Other | Attending: Family Medicine | Admitting: Family Medicine

## 2022-02-17 ENCOUNTER — Encounter (HOSPITAL_COMMUNITY): Payer: Self-pay

## 2022-02-17 ENCOUNTER — Encounter: Payer: Self-pay | Admitting: Student

## 2022-02-17 DIAGNOSIS — Z79899 Other long term (current) drug therapy: Secondary | ICD-10-CM | POA: Diagnosis not present

## 2022-02-17 DIAGNOSIS — J45909 Unspecified asthma, uncomplicated: Secondary | ICD-10-CM | POA: Diagnosis not present

## 2022-02-17 DIAGNOSIS — E1110 Type 2 diabetes mellitus with ketoacidosis without coma: Secondary | ICD-10-CM | POA: Diagnosis present

## 2022-02-17 DIAGNOSIS — E1010 Type 1 diabetes mellitus with ketoacidosis without coma: Secondary | ICD-10-CM | POA: Diagnosis not present

## 2022-02-17 DIAGNOSIS — E1065 Type 1 diabetes mellitus with hyperglycemia: Secondary | ICD-10-CM | POA: Diagnosis present

## 2022-02-17 DIAGNOSIS — E876 Hypokalemia: Secondary | ICD-10-CM | POA: Diagnosis present

## 2022-02-17 DIAGNOSIS — Z91040 Latex allergy status: Secondary | ICD-10-CM | POA: Diagnosis not present

## 2022-02-17 DIAGNOSIS — Z794 Long term (current) use of insulin: Secondary | ICD-10-CM | POA: Diagnosis not present

## 2022-02-17 LAB — I-STAT VENOUS BLOOD GAS, ED
Acid-base deficit: 8 mmol/L — ABNORMAL HIGH (ref 0.0–2.0)
Bicarbonate: 17.4 mmol/L — ABNORMAL LOW (ref 20.0–28.0)
Calcium, Ion: 1.23 mmol/L (ref 1.15–1.40)
HCT: 37 % (ref 36.0–46.0)
Hemoglobin: 12.6 g/dL (ref 12.0–15.0)
O2 Saturation: 65 %
Potassium: 4.3 mmol/L (ref 3.5–5.1)
Sodium: 134 mmol/L — ABNORMAL LOW (ref 135–145)
TCO2: 18 mmol/L — ABNORMAL LOW (ref 22–32)
pCO2, Ven: 33.4 mmHg — ABNORMAL LOW (ref 44–60)
pH, Ven: 7.324 (ref 7.25–7.43)
pO2, Ven: 36 mmHg (ref 32–45)

## 2022-02-17 LAB — CBC
HCT: 40.1 % (ref 36.0–46.0)
Hemoglobin: 12.8 g/dL (ref 12.0–15.0)
MCH: 28.4 pg (ref 26.0–34.0)
MCHC: 31.9 g/dL (ref 30.0–36.0)
MCV: 89.1 fL (ref 80.0–100.0)
Platelets: 386 10*3/uL (ref 150–400)
RBC: 4.5 MIL/uL (ref 3.87–5.11)
RDW: 14 % (ref 11.5–15.5)
WBC: 7.1 10*3/uL (ref 4.0–10.5)
nRBC: 0 % (ref 0.0–0.2)

## 2022-02-17 LAB — BLOOD GAS, VENOUS
Acid-base deficit: 9.8 mmol/L — ABNORMAL HIGH (ref 0.0–2.0)
Bicarbonate: 14.9 mmol/L — ABNORMAL LOW (ref 20.0–28.0)
O2 Saturation: 76.5 %
Patient temperature: 36.7
pCO2, Ven: 29 mmHg — ABNORMAL LOW (ref 44–60)
pH, Ven: 7.32 (ref 7.25–7.43)
pO2, Ven: 44 mmHg (ref 32–45)

## 2022-02-17 LAB — BASIC METABOLIC PANEL
Anion gap: 17 — ABNORMAL HIGH (ref 5–15)
Anion gap: 15 (ref 5–15)
BUN: 14 mg/dL (ref 6–20)
BUN: 12 mg/dL (ref 6–20)
CO2: 14 mmol/L — ABNORMAL LOW (ref 22–32)
CO2: 15 mmol/L — ABNORMAL LOW (ref 22–32)
Calcium: 8.9 mg/dL (ref 8.9–10.3)
Calcium: 8.8 mg/dL — ABNORMAL LOW (ref 8.9–10.3)
Chloride: 99 mmol/L (ref 98–111)
Chloride: 102 mmol/L (ref 98–111)
Creatinine, Ser: 0.89 mg/dL (ref 0.44–1.00)
Creatinine, Ser: 0.87 mg/dL (ref 0.44–1.00)
GFR, Estimated: >60 mL/min (ref >60)
GFR, Estimated: >60 mL/min (ref >60)
Glucose, Bld: 502 mg/dL (ref 70–99)
Glucose, Bld: 403 mg/dL — ABNORMAL HIGH (ref 70–99)
Potassium: 4 mmol/L (ref 3.5–5.1)
Potassium: 4.2 mmol/L (ref 3.5–5.1)
Sodium: 130 mmol/L — ABNORMAL LOW (ref 135–145)
Sodium: 132 mmol/L — ABNORMAL LOW (ref 135–145)

## 2022-02-17 LAB — GLUCOSE, CAPILLARY
Glucose-Capillary: 320 mg/dL — ABNORMAL HIGH (ref 70–99)
Glucose-Capillary: 189 mg/dL — ABNORMAL HIGH (ref 70–99)

## 2022-02-17 LAB — CBG MONITORING, ED
Glucose-Capillary: 473 mg/dL — ABNORMAL HIGH (ref 70–99)
Glucose-Capillary: 527 mg/dL (ref 70–99)
Glucose-Capillary: 412 mg/dL — ABNORMAL HIGH (ref 70–99)

## 2022-02-17 LAB — BETA-HYDROXYBUTYRIC ACID: Beta-Hydroxybutyric Acid: 5.7 mmol/L — ABNORMAL HIGH (ref 0.05–0.27)

## 2022-02-17 LAB — I-STAT BETA HCG BLOOD, ED (MC, WL, AP ONLY): I-stat hCG, quantitative: <5 m[IU]/mL (ref <5)

## 2022-02-17 MED ORDER — FAMOTIDINE 20 MG PO TABS
20.0000 mg | ORAL_TABLET | Freq: Two times a day (BID) | ORAL | Status: DC
  Administered 2022-02-17 – 2022-02-19 (×4): 20 mg via ORAL
  Filled 2022-02-17 (×5): qty 1

## 2022-02-17 MED ORDER — INSULIN REGULAR(HUMAN) IN NACL 100-0.9 UT/100ML-% IV SOLN
INTRAVENOUS | Status: DC
  Administered 2022-02-17: 8 [IU]/h via INTRAVENOUS
  Filled 2022-02-17: qty 100

## 2022-02-17 MED ORDER — LACTATED RINGERS IV BOLUS: 1000.0000 mL | INTRAVENOUS | Status: DC

## 2022-02-17 MED ORDER — SERTRALINE HCL 50 MG PO TABS
75.0000 mg | ORAL_TABLET | Freq: Every day | ORAL | Status: DC
  Administered 2022-02-18 – 2022-02-19 (×2): 75 mg via ORAL
  Filled 2022-02-17 (×2): qty 1

## 2022-02-17 MED ORDER — DEXTROSE IN LACTATED RINGERS 5 % IV SOLN: INTRAVENOUS | Status: DC

## 2022-02-17 MED ORDER — ENOXAPARIN SODIUM 40 MG/0.4ML IJ SOSY
40.0000 mg | PREFILLED_SYRINGE | INTRAMUSCULAR | Status: DC
  Filled 2022-02-17 (×3): qty 0.4

## 2022-02-17 MED ORDER — DEXTROSE 50 % IV SOLN: 0.0000 mL | INTRAVENOUS | Status: DC | PRN

## 2022-02-17 MED ORDER — POTASSIUM CHLORIDE 10 MEQ/100ML IV SOLN
10.0000 meq | INTRAVENOUS | Status: DC
  Administered 2022-02-17: 10 meq via INTRAVENOUS
  Filled 2022-02-17: qty 100

## 2022-02-17 MED ORDER — LACTATED RINGERS IV SOLN
INTRAVENOUS | Status: DC
  Administered 2022-02-17: 1000 mL via INTRAVENOUS

## 2022-02-17 MED ORDER — ACETAMINOPHEN 325 MG PO TABS
650.0000 mg | ORAL_TABLET | Freq: Four times a day (QID) | ORAL | Status: DC | PRN
  Administered 2022-02-17 – 2022-02-19 (×2): 650 mg via ORAL
  Filled 2022-02-17 (×2): qty 2

## 2022-02-17 NOTE — Assessment & Plan Note (Addendum)
Resolved. Now titrating insulin dose. Glucose still above 250 as of last night. Goal < 250 before discharge.  - BMP daily  - 15 unit long acting with 3 units mealtime and night time coverage.  - TOC for medication assistance - F/u am BG

## 2022-02-17 NOTE — ED Provider Triage Note (Signed)
Emergency Medicine Provider Triage Evaluation Note  Dawn [name] , a 22 y.o. female  was evaluated in triage.  Pt complains of of elevated blood sugar.  She has history of type 1 diabetes.  States that she has been taking her medications, but she has been out of supplies to check her blood glucose.  Today when she checked it it was high.  She denies any recent illnesses.  Review of Systems  Positive: Weakness Negative: Fevers, chills  Physical Exam  BP 106/76   Pulse (!) 106   Temp 98.2 F (36.8 C) (Oral)   Resp 18   Ht 5' 1.5" (1.562 m)   Wt 52.7 kg   SpO2 98%   BMI 21.60 kg/m  Gen:   Awake, no distress   Resp:  Normal effort  MSK:   Moves extremities without difficulty    Medical Decision Making  Medically screening exam initiated at 3:22 PM.  Appropriate orders placed.  Toini Seta was informed that the remainder of the evaluation will be completed by another provider, this initial triage assessment does not replace that evaluation, and the importance of remaining in the ED until their evaluation is complete.  Appropriate labs have been ordered. Patient clinically stable besides tachycardia.   Varney Biles, MD 02/17/22 [phone]

## 2022-02-17 NOTE — H&P (Signed)
Hospital Admission History and Physical Service Pager: [phone]  Patient name: Dawn [name] Medical record number: [id number] Date of Birth: Nov 12, 2000 Age: 22 y.o. Gender: female  Primary Care Provider: Orvis Brill, DO Consultants: None Code Status: full code  Preferred Emergency Contact:  Contact Information     Name Relation Home Work [place] [phone]  [phone]        Chief Complaint: Hyperglycemia   Assessment and Plan: Dawn [name] is a 22 y.o. female presenting with hyperglycemia . Differential for this patient's presentation of this includes HHS, DKA. Patient has DKA as she meets all of the criteria. Most likely due to medication non adherence, no other recent inciting illness.   DKA (diabetic ketoacidosis) ([place]) Patient with frequent admissions for DKA secondary to issues with medication adherence. Patient is homeless and has issues with consistently checking her sugars and taking her insulin. No inciting illness to have caused DKA this time. Most likely secondary to medication non adherence. Patient is stable at this time. Home regimen of tresiba 40 u, Humalog 2-12 u TID.  - Admit FPTS, Attending Dr. Erin Hearing  - Continue endotool until gap is closed x 2  - BMP every 4 hours - NPO while on endotool  - Continue cardiac monitoring  - Vitals per floor  - PT and OT treat and eval  - TOC for medication assistance     Chronic Conditions  MDD: Continue sertraline  GERD: Continue pepcid    FEN/GI: NPO while on endotool  VTE Prophylaxis: Lovenox   Disposition: Progressive   History of Present Illness:  Dawn [name] is a 22 y.o. female presenting with weakness and hyperglycemia.   Patient says that she started feeling weak with myalgias yesterday. She asked her coworker to check her BG and it was elevated, prompting her to come in. She says she has not been able to check her sugars recently as her insurance would no longer cover  her dexcom. She has not been using a glucometer. Patient endorses taking her medications daily (40 u tresiba and says she has been taking 20 u humalog BID for the last few days because she could not check her sugars).   Says she started her period today. Denies recent URI, GI illness, or dysuria.   Patient mentions that she does not have a home and goes back and forth between friends, her aunt's house, and mother's place.   Patient says she is sexually active and has been using condoms as contraception. Says she had an abdominal ultrasound recently scheduled as she says she wanted to be evaluated for pregnancy and occasional pains which she does not have at this time. She says she is uncomfortable with idea of nexplanon due to hearing bad reviews from family. She would possibly be interested in going back on depo again.   In the ED, patient's vitals were stable. She had glucose of 527 and potassium of 4.0.  Her pH was 7.3 and gap of 17. She was started on endotool.   Review Of Systems: Per HPI   Pertinent Past Medical History: T1DM with frequent admissions for DKA Migraines ADHD Depression Asthma  Remainder reviewed in history tab.   Pertinent Past Surgical History: None pertinent   Remainder reviewed in history tab.   Pertinent Social History: Tobacco use: Denies Alcohol use: Denies Other Substance use: marijuana Lives between mother's and aunt's houses  Pertinent Family History: Father: heart disease Mother: asthma  Remainder reviewed in history tab.  Important Outpatient Medications: [name] 40 u  Humalog 5-15 u  Sumatriptan 12.5  Sertraline 75 mg  Albuterol inhaler  Remainder reviewed in medication history.   Objective: BP 112/73   Pulse 98   Temp 97.9 F (36.6 C) (Oral)   Resp 19   Ht 5' 1.5" (1.562 m)   Wt 52.7 kg   SpO2 100%   BMI 21.60 kg/m  Exam: General: Well appearing, in no acute distress ENTM: Moist mucous membranes  Cardiovascular: RRR, No  m/r/g, hands slightly cold but radial pulses equal and palpable, cap refill = 2-3 seconds  Respiratory: No increased work of breathing, CTAB Gastrointestinal: Soft, non distended, non tender to palpation  Neuro: Alert and oriented x 4, no sleepiness   Labs:  CBC BMET  Recent Labs  Lab 02/17/22 1537 02/17/22 2005  WBC 7.1  --   HGB 12.8 12.6  HCT 40.1 37.0  PLT 386  --    Recent Labs  Lab 02/17/22 1959 02/17/22 2005  NA 132* 134*  K 4.2 4.3  CL 102  --   CO2 15*  --   BUN 12  --   CREATININE 0.87  --   GLUCOSE 403*  --   CALCIUM 8.8*  --       EKG: NSR, Qtc 423    Imaging Studies Performed: None   Lowry Ram, MD 02/17/2022, 9:49 PM PGY-1, [facility] pager: [phone], text pages welcome Secure chat group [facility] Teaching Service

## 2022-02-17 NOTE — ED Provider Notes (Addendum)
[facility] Provider Note   CSN: [id number] Arrival date & time: 02/17/22  1511     History  Chief Complaint  Patient presents with   Hyperglycemia    Dawn [name] is a 22 y.o. female.  HPI     22 year old female comes in with chief complaint of elevated blood sugar.  Patient states that she has not checked her blood sugar over the last several days.  Today she was feeling profoundly weak, she asked her colleague to check her blood sugar and it read high.  She came to the ER concerned about her elevated blood sugar.  She has been type I diabetic since age 75.  She states that she takes her medications as prescribed.  Patient denies any new URI-like symptoms, UTI-like symptoms, fevers, chills, nausea, vomiting.  Home Medications Prior to Admission medications   Medication Sig Start Date End Date Taking? Authorizing Provider  Accu-Chek Softclix Lancets lancets Please use to check blood sugar up to 6 times/day. 08/05/21   August Albino, MD  blood glucose meter kit and supplies Dispense based on patient and insurance preference. Use up to four times daily as directed. (FOR ICD-10 E10.9, E11.9). 08/05/21   August Albino, MD  Blood Glucose Monitoring Suppl (ACCU-CHEK GUIDE ME) w/Device KIT Use 3 (three) times daily before meals. 08/05/21   August Albino, MD  Continuous Blood Gluc Sensor (DEXCOM G6 SENSOR) MISC Replace sensor every 10 days. 09/02/21   Sharion Settler, DO  famotidine (PEPCID) 20 MG tablet Take 1 tablet (20 mg total) by mouth 2 (two) times daily. 02/04/22   Audley Hose, MD  glucose blood (ACCU-CHEK GUIDE) test strip Use to check glucose 6 times daily 08/05/21   August Albino, MD  insulin lispro (HUMALOG KWIKPEN) 100 UNIT/ML KwikPen Inject 5-15 Units into the skin 3 times daily with meals- per sliding scale based on carb count. (See admin instructions.) Patient taking differently: Inject 5-20 Units into the skin See admin  instructions. Inject 5-20 units into the skin three times a day with meals, per sliding scale 08/05/21 12/15/21  August Albino, MD  Insulin Pen Needle (BD PEN NEEDLE NANO 2ND GEN) 32G X 4 MM MISC USE TO INJECT INSULIN 6 TIMES DAILY 08/05/21   August Albino, MD  polyethylene glycol powder (GLYCOLAX/MIRALAX) 17 GM/SCOOP powder Take 1 capful (17 g) by mouth daily. Patient not taking: Reported on 01/06/2022 12/15/21   Wells Guiles, DO  sertraline (ZOLOFT) 25 MG tablet Take 3 tablets (75 mg total) by mouth daily. 09/03/21   Sharion Settler, DO  SUMAtriptan (IMITREX) 25 MG tablet Take 0.5 tablets (12.5 mg total) by mouth every 2 (two) hours as needed for migraine. May repeat in 2 hours if headache persists or recurs. 04/12/21   [name], MD  [name] FLEXTOUCH 100 UNIT/ML FlexTouch Pen Inject 40 Units into the skin daily. 11/09/21   [provider]      Allergies    Latex, Tape, and Shellfish allergy    Review of Systems   Review of Systems  All other systems reviewed and are negative.   Physical Exam Updated Vital Signs BP 107/73   Pulse 96   Temp 97.9 F (36.6 C) (Oral)   Resp 13   Ht 5' 1.5" (1.562 m)   Wt 52.7 kg   SpO2 100%   BMI 21.60 kg/m  Physical Exam Vitals and nursing note reviewed.  Constitutional:      Appearance: She is well-developed.  HENT:     Head: Atraumatic.  Cardiovascular:     Rate and Rhythm: Normal rate.  Pulmonary:     Effort: Pulmonary effort is normal.  Musculoskeletal:     Cervical back: Normal range of motion and neck supple.  Skin:    General: Skin is warm and dry.  Neurological:     Mental Status: She is alert and oriented to person, place, and time.     ED Results / Procedures / Treatments   Labs (all labs ordered are listed, but only abnormal results are displayed) Labs Reviewed  BASIC METABOLIC PANEL - Abnormal; Notable for the following components:      Result Value   Sodium 130 (*)    CO2 14 (*)    Glucose, Bld 502 (*)     Anion gap 17 (*)    All other components within normal limits  CBG MONITORING, ED - Abnormal; Notable for the following components:   Glucose-Capillary 527 (*)    All other components within normal limits  CBG MONITORING, ED - Abnormal; Notable for the following components:   Glucose-Capillary 473 (*)    All other components within normal limits  CBG MONITORING, ED - Abnormal; Notable for the following components:   Glucose-Capillary 412 (*)    All other components within normal limits  CBC  URINALYSIS, ROUTINE W REFLEX MICROSCOPIC  BETA-HYDROXYBUTYRIC ACID  BETA-HYDROXYBUTYRIC ACID  URINALYSIS, ROUTINE W REFLEX MICROSCOPIC  BLOOD GAS, VENOUS  BASIC METABOLIC PANEL  I-STAT BETA HCG BLOOD, ED (MC, WL, AP ONLY)  I-STAT VENOUS BLOOD GAS, ED    EKG None  Radiology No results found.  Procedures .Critical Care  Performed by: Varney Biles, MD Authorized by: Varney Biles, MD   Critical care provider statement:    Critical care time (minutes):  37   Critical care was necessary to treat or prevent imminent or life-threatening deterioration of the following conditions:  Endocrine crisis   Critical care was time spent personally by me on the following activities:  Development of treatment plan with patient or surrogate, discussions with consultants, evaluation of patient's response to treatment, examination of patient, ordering and review of laboratory studies, ordering and review of radiographic studies, ordering and performing treatments and interventions, pulse oximetry, re-evaluation of patient's condition and review of old charts Ultrasound ED Peripheral IV (Provider)  Date/Time: 02/17/2022 7:58 PM  Performed by: Varney Biles, MD Authorized by: Varney Biles, MD   Procedure details:    Indications: hydration and multiple failed IV attempts     Skin Prep: chlorhexidine gluconate     Location:  Right AC   Angiocath:  22 G   Bedside Ultrasound Guided: Yes     Images:  not archived     Patient tolerated procedure without complications: Yes     Dressing applied: Yes       Medications Ordered in ED Medications  insulin regular, human (MYXREDLIN) 100 units/ 100 mL infusion (has no administration in time range)  lactated ringers infusion (has no administration in time range)  dextrose 5 % in lactated ringers infusion (has no administration in time range)  dextrose 50 % solution 0-50 mL (has no administration in time range)  lactated ringers bolus 1,000 mL (has no administration in time range)  potassium chloride 10 mEq in 100 mL IVPB (has no administration in time range)    ED Course/ Medical Decision Making/ A&P Clinical Course as of 02/17/22 1958  Thu Feb 17, 2022  1803 CO2(!): 14 Patient  has a bicarb of 14, anion gap of 17, blood sugar over 500.  We will proceed with admission request.  DKA protocol initiated for treatment.  Patient otherwise stable. [AN]    Clinical Course User Index [AN] Varney Biles, MD                             Medical Decision Making 23 year old female comes in with chief complaint of elevated blood sugar.  Patient has history of type 1 diabetes.  I have reviewed care everywhere, patient has had few admissions for DKA within the last year.  Differential diagnosis for her includes HHS, DKA, hyperglycemia without ketosis, severe dehydration, electrolyte abnormality.  History and exam is not suggestive of underlying infectious cause. Appropriate labs have been ordered.  Problems Addressed: Type 1 diabetes mellitus with ketoacidosis without coma ([place]): acute illness or injury with systemic symptoms  Amount and/or Complexity of Data Reviewed Labs: ordered. Decision-making details documented in ED Course.  Risk Prescription drug management. Decision regarding hospitalization.    Final Clinical Impression(s) / ED Diagnoses Final diagnoses:  Type 1 diabetes mellitus with ketoacidosis without coma [facility])    Rx /  DC Orders ED Discharge Orders     None         Varney Biles, MD 02/17/22 1803    Varney Biles, MD 02/17/22 1958
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
## 2022-02-17 NOTE — ED Triage Notes (Signed)
Pt arrived via [facility] from work for hyperglycemia. Pt last took her insulin at home yesterday. Pt doesn't have a way to check her blood sugar at home. Pt c/o abdominal pain, HA and nausea.

## 2022-02-17 NOTE — ED Notes (Signed)
This RN tried twice to get IV placement and was unsuccessful.  MD Nanavati notified and aware.

## 2022-02-18 ENCOUNTER — Other Ambulatory Visit (HOSPITAL_COMMUNITY): Payer: Self-pay

## 2022-02-18 ENCOUNTER — Other Ambulatory Visit: Payer: Medicaid Other

## 2022-02-18 DIAGNOSIS — E1010 Type 1 diabetes mellitus with ketoacidosis without coma: Secondary | ICD-10-CM | POA: Diagnosis not present

## 2022-02-18 LAB — BASIC METABOLIC PANEL
Anion gap: 13 (ref 5–15)
Anion gap: 9 (ref 5–15)
Anion gap: 7 (ref 5–15)
BUN: 6 mg/dL (ref 6–20)
BUN: 6 mg/dL (ref 6–20)
BUN: 6 mg/dL (ref 6–20)
CO2: 17 mmol/L — ABNORMAL LOW (ref 22–32)
CO2: 20 mmol/L — ABNORMAL LOW (ref 22–32)
CO2: 22 mmol/L (ref 22–32)
Calcium: 8.9 mg/dL (ref 8.9–10.3)
Calcium: 8.6 mg/dL — ABNORMAL LOW (ref 8.9–10.3)
Calcium: 9.2 mg/dL (ref 8.9–10.3)
Chloride: 107 mmol/L (ref 98–111)
Chloride: 105 mmol/L (ref 98–111)
Chloride: 103 mmol/L (ref 98–111)
Creatinine, Ser: 0.65 mg/dL (ref 0.44–1.00)
Creatinine, Ser: 0.75 mg/dL (ref 0.44–1.00)
Creatinine, Ser: 0.85 mg/dL (ref 0.44–1.00)
GFR, Estimated: >60 mL/min (ref >60)
GFR, Estimated: >60 mL/min (ref >60)
GFR, Estimated: >60 mL/min (ref >60)
Glucose, Bld: 128 mg/dL — ABNORMAL HIGH (ref 70–99)
Glucose, Bld: 303 mg/dL — ABNORMAL HIGH (ref 70–99)
Glucose, Bld: 362 mg/dL — ABNORMAL HIGH (ref 70–99)
Potassium: 3.2 mmol/L — ABNORMAL LOW (ref 3.5–5.1)
Potassium: 4.1 mmol/L (ref 3.5–5.1)
Potassium: 4.5 mmol/L (ref 3.5–5.1)
Sodium: 137 mmol/L (ref 135–145)
Sodium: 134 mmol/L — ABNORMAL LOW (ref 135–145)
Sodium: 132 mmol/L — ABNORMAL LOW (ref 135–145)

## 2022-02-18 LAB — HEMOGLOBIN A1C
Hgb A1c MFr Bld: 12.2 % — ABNORMAL HIGH (ref 4.8–5.6)
Mean Plasma Glucose: 303.44 mg/dL

## 2022-02-18 LAB — GLUCOSE, CAPILLARY
Glucose-Capillary: 113 mg/dL — ABNORMAL HIGH (ref 70–99)
Glucose-Capillary: 130 mg/dL — ABNORMAL HIGH (ref 70–99)
Glucose-Capillary: 131 mg/dL — ABNORMAL HIGH (ref 70–99)
Glucose-Capillary: 147 mg/dL — ABNORMAL HIGH (ref 70–99)
Glucose-Capillary: 168 mg/dL — ABNORMAL HIGH (ref 70–99)
Glucose-Capillary: 183 mg/dL — ABNORMAL HIGH (ref 70–99)
Glucose-Capillary: 213 mg/dL — ABNORMAL HIGH (ref 70–99)
Glucose-Capillary: 252 mg/dL — ABNORMAL HIGH (ref 70–99)
Glucose-Capillary: 305 mg/dL — ABNORMAL HIGH (ref 70–99)
Glucose-Capillary: 337 mg/dL — ABNORMAL HIGH (ref 70–99)
Glucose-Capillary: 358 mg/dL — ABNORMAL HIGH (ref 70–99)

## 2022-02-18 LAB — BETA-HYDROXYBUTYRIC ACID: Beta-Hydroxybutyric Acid: 3.8 mmol/L — ABNORMAL HIGH (ref 0.05–0.27)

## 2022-02-18 LAB — CBC
HCT: 32.5 % — ABNORMAL LOW (ref 36.0–46.0)
Hemoglobin: 10.7 g/dL — ABNORMAL LOW (ref 12.0–15.0)
MCH: 28.5 pg (ref 26.0–34.0)
MCHC: 32.9 g/dL (ref 30.0–36.0)
MCV: 86.7 fL (ref 80.0–100.0)
Platelets: 316 10*3/uL (ref 150–400)
RBC: 3.75 MIL/uL — ABNORMAL LOW (ref 3.87–5.11)
RDW: 13.8 % (ref 11.5–15.5)
WBC: 6.2 10*3/uL (ref 4.0–10.5)
nRBC: 0 % (ref 0.0–0.2)

## 2022-02-18 LAB — GLUCOSE, RANDOM: Glucose, Bld: 359 mg/dL — ABNORMAL HIGH (ref 70–99)

## 2022-02-18 MED ORDER — INSULIN ASPART 100 UNIT/ML IJ SOLN
0.0000 [IU] | Freq: Three times a day (TID) | INTRAMUSCULAR | Status: DC
  Administered 2022-02-19: 2 [IU] via SUBCUTANEOUS

## 2022-02-18 MED ORDER — INSULIN GLARGINE-YFGN 100 UNIT/ML ~~LOC~~ SOLN
15.0000 [IU] | Freq: Once | SUBCUTANEOUS | Status: AC
  Administered 2022-02-18: 15 [IU] via SUBCUTANEOUS
  Filled 2022-02-18: qty 0.15

## 2022-02-18 MED ORDER — POTASSIUM CHLORIDE 10 MEQ/100ML IV SOLN
10.0000 meq | INTRAVENOUS | Status: AC
  Administered 2022-02-18 (×4): 10 meq via INTRAVENOUS
  Filled 2022-02-18 (×4): qty 100

## 2022-02-18 MED ORDER — INSULIN ASPART 100 UNIT/ML IJ SOLN
7.0000 [IU] | Freq: Once | INTRAMUSCULAR | Status: AC
  Administered 2022-02-18: 7 [IU] via SUBCUTANEOUS

## 2022-02-18 MED ORDER — POTASSIUM CHLORIDE CRYS ER 20 MEQ PO TBCR
40.0000 meq | EXTENDED_RELEASE_TABLET | Freq: Once | ORAL | Status: AC
  Administered 2022-02-18: 40 meq via ORAL
  Filled 2022-02-18: qty 2

## 2022-02-18 MED ORDER — FINGERSTIX LANCETS MISC
0 refills | Status: DC
  Filled 2022-02-18: qty 100, 25d supply, fill #0

## 2022-02-18 MED ORDER — GLUCOSE BLOOD VI STRP
ORAL_STRIP | 0 refills | Status: DC
  Filled 2022-02-18: qty 100, 25d supply, fill #0

## 2022-02-18 MED ORDER — ACCU-CHEK GUIDE ME W/DEVICE KIT
1.0000 | PACK | Freq: Three times a day (TID) | 0 refills | Status: DC
  Filled 2022-02-18: qty 1, 30d supply, fill #0

## 2022-02-18 MED ORDER — INSULIN ASPART 100 UNIT/ML IJ SOLN
0.0000 [IU] | Freq: Every day | INTRAMUSCULAR | Status: DC
  Administered 2022-02-18: 3 [IU] via SUBCUTANEOUS

## 2022-02-18 MED ORDER — INSULIN GLARGINE-YFGN 100 UNIT/ML ~~LOC~~ SOLN
15.0000 [IU] | Freq: Every day | SUBCUTANEOUS | Status: DC
  Administered 2022-02-18 – 2022-02-19 (×2): 15 [IU] via SUBCUTANEOUS
  Filled 2022-02-18 (×3): qty 0.15

## 2022-02-18 MED ORDER — INSULIN ASPART 100 UNIT/ML IJ SOLN
3.0000 [IU] | Freq: Three times a day (TID) | INTRAMUSCULAR | Status: DC
  Administered 2022-02-19: 3 [IU] via SUBCUTANEOUS

## 2022-02-18 MED ORDER — INSULIN ASPART 100 UNIT/ML IJ SOLN
5.0000 [IU] | Freq: Once | INTRAMUSCULAR | Status: AC
  Administered 2022-02-18: 5 [IU] via SUBCUTANEOUS

## 2022-02-18 NOTE — Care Management (Signed)
Consult for medication assistance. The patient has insurance, therefore is ineligible for [facility] medication assistance. Her insurance no longer covers dexi, she will have to have a script for glucometer, strips , etc so she can take her blood sugars.

## 2022-02-18 NOTE — Assessment & Plan Note (Signed)
K 3.2. Repleted - CTM

## 2022-02-18 NOTE — Evaluation (Signed)
Physical Therapy Evaluation and Discharge Patient Details Name: Dawn [name] MRN: [id number] DOB: [birth year] Today's Date: 02/18/2022  History of Present Illness  22 y/o F admitted to [name] on 2/15 for weakness and hyperglycemia. PMHx: T1DM, migraines, ADHD, depression, asthma.  Clinical Impression  Pt presents today functioning at her mobility baseline. Pt admitted with muscle weakness but strength in BLE grossly WFL, no coordination or sensation impairments noted. Pt reports she has neuropathy and occasionally has sharp pains in her feet, unable to provoke during today's session. Pt reports no concerns with mobility upon discharge, encouraged pt to continue to mobilize during her admission. Pt with no further benefit from skilled acute PT at this time, no current need for PT upon discharge. Acute PT signing off.      Recommendations for follow up therapy are one component of a multi-disciplinary discharge planning process, led by the attending physician.  Recommendations may be updated based on patient status, additional functional criteria and insurance authorization.  Follow Up Recommendations No PT follow up      Assistance Recommended at Discharge PRN  Patient can return home with the following  Assist for transportation    Equipment Recommendations None recommended by PT  Recommendations for Other Services       Functional Status Assessment Patient has not had a recent decline in their functional status     Precautions / Restrictions Precautions Precautions: Fall Restrictions Weight Bearing Restrictions: No      Mobility  Bed Mobility Overal bed mobility: Independent             General bed mobility comments: pt sitting criss-crossed in the bed upon arrival, ended session in supine, no issues with bed, no use of bedrail    Transfers Overall transfer level: Independent Equipment used: None               General transfer comment: no issues with sit<>stand  from bed or toilet    Ambulation/Gait Ambulation/Gait assistance: Independent Gait Distance (Feet): 200 Feet Assistive device: None Gait Pattern/deviations: Wide base of support Gait velocity: WFL     General Gait Details: intermittent change of gait speed  Stairs            Wheelchair Mobility    Modified Rankin (Stroke Patients Only)       Balance Overall balance assessment: No apparent balance deficits (not formally assessed)                                           Pertinent Vitals/Pain Pain Assessment Pain Assessment: 0-10 Pain Score: 9  Pain Location: abdomen and low back Pain Descriptors / Indicators: Cramping Pain Intervention(s): Monitored during session, Repositioned, Heat applied    Home Living Family/patient expects to be discharged to:: Private residence Living Arrangements: Parent Available Help at Discharge: Family;Available 24 hours/day Type of Home: House Home Access: Level entry       Home Layout: One level Home Equipment: None Additional Comments: Pt lives with her mom who is available to assist if needed    Prior Function Prior Level of Function : Independent/Modified Independent             Mobility Comments: independent with all mobility, drives occasionally and family provides transport       Hand Dominance   Dominant Hand: Right    Extremity/Trunk Assessment   Upper Extremity Assessment Upper Extremity  Assessment: Defer to OT evaluation    Lower Extremity Assessment Lower Extremity Assessment: Overall WFL for tasks assessed    Cervical / Trunk Assessment Cervical / Trunk Assessment: Normal  Communication   Communication: No difficulties  Cognition Arousal/Alertness: Awake/alert Behavior During Therapy: WFL for tasks assessed/performed Overall Cognitive Status: Within Functional Limits for tasks assessed                                 General Comments: A&Ox4, pleasant  throughout session        General Comments General comments (skin integrity, edema, etc.): VSS on room air    Exercises     Assessment/Plan    PT Assessment Patient does not need any further PT services  PT Problem List         PT Treatment Interventions      PT Goals (Current goals can be found in the Care Plan section)  Acute Rehab PT Goals Patient Stated Goal: go home PT Goal Formulation: All assessment and education complete, DC therapy Potential to Achieve Goals: Good    Frequency       Co-evaluation               AM-PAC PT "6 Clicks" Mobility  Outcome Measure Help needed turning from your back to your side while in a flat bed without using bedrails?: None Help needed moving from lying on your back to sitting on the side of a flat bed without using bedrails?: None Help needed moving to and from a bed to a chair (including a wheelchair)?: None Help needed standing up from a chair using your arms (e.g., wheelchair or bedside chair)?: None Help needed to walk in hospital room?: None Help needed climbing 3-5 steps with a railing? : None 6 Click Score: 24    End of Session   Activity Tolerance: Patient tolerated treatment well Patient left: in bed;with call bell/phone within reach Nurse Communication: Mobility status PT Visit Diagnosis: Difficulty in walking, not elsewhere classified (R26.2)    Time: [id number] PT Time Calculation (min) (ACUTE ONLY): 21 min   Charges:   PT Evaluation $PT Eval Low Complexity: 1 Low          Dawn [name], PT DPT Acute Rehabilitation Services Office [phone]   Dawn [name] 02/18/2022, 10:25 AM

## 2022-02-18 NOTE — Progress Notes (Signed)
OT Cancellation Note  Patient Details Name: Dawn [name] MRN: [id number] DOB: [birth year]   Cancelled Treatment:    Reason Eval/Treat Not Completed: OT screened, no needs identified, will sign off  Malka So 02/18/2022, 12:05 PM Cleta Alberts, OTR/L Acute Rehabilitation Services Office: [phone]

## 2022-02-18 NOTE — Discharge Instructions (Signed)
Dear Dawn [name],   Thank you so much for allowing [place] to be part of your care!  You were admitted to [facility] for DKA. You were put on an insulin drip and transitioned to subcutaneous insulin. You are discharged on 40 u long acting insulin.    POST-HOSPITAL & CARE INSTRUCTIONS Please take your insulin as prescribed Please let PCP/Specialists know of any changes that were made.  Please see medications section of this packet for any medication changes.   DOCTOR'S APPOINTMENT & FOLLOW UP CARE INSTRUCTIONS  No future appointments.  RETURN PRECAUTIONS: Vomiting, confused, having very high blood sugars  Take care and be well!  [facility]  [place], [place] [address] [phone]

## 2022-02-18 NOTE — Inpatient Diabetes Management (Signed)
Inpatient Diabetes Program Recommendations  AACE/ADA: New Consensus Statement on Inpatient Glycemic Control (2015)  Target Ranges:  Prepandial:   less than 140 mg/dL      Peak postprandial:   less than 180 mg/dL (1-2 hours)      Critically ill patients:  140 - 180 mg/dL   Lab Results  Component Value Date   GLUCAP 358 (H) 02/18/2022   HGBA1C 12.2 (H) 02/18/2022    Spoke with Dawn [name] at bedside. Dawn [name] reports her MD office notified her today that her dexcom would be covered under her insurance and the prescription would be sent to the pharmacy. Provided Dawn [name] with a dexcom G7 sensor sample to place on herself today for home use. Dawn [name] reports better glucose control while wearing the dexcom. Dawn [name] reports her mom was cleaning her room and threw away her dexcom sensors. She was still taking her insulin per her report. Dawn [name] reports better glucose control. She said her A1c was at a 14% and now its a 12. Dawn [name] is waiting on her T slim insulin pump to come into the MD office so she can be trained on how to use it.  Thanks,  Tama Headings RN, MSN, BC-ADM Inpatient Diabetes Coordinator Team Pager [phone] (8a-5p)

## 2022-02-18 NOTE — Progress Notes (Signed)
     Daily Progress Note Intern Pager: [phone]  Patient name: Dawn [name] Medical record number: [id number] Date of birth: [birth year] Age: 22 y.o. Gender: female  Primary Care Provider: [name], MD Consultants: None Code Status: full code    Pt Overview and Major Events to Date:  2/15 admitted 2/16 - off endotool   Assessment and Plan: Dawn [name] is a 22 y.o. female presenting with hyperglycemia . Differential for this patient's presentation of this includes HHS, DKA. Patient had DKA due to medication non adherence, no other recent inciting illness, now resolved.    DKA (diabetic ketoacidosis) ([place]) Resolved. Now titrating insulin dose. Glucose still above 250 as of last night. Goal < 250 before discharge.  - BMP daily  - 15 unit long acting with 3 units mealtime and night time coverage.  - TOC for medication assistance - F/u am BG  FEN/GI: regular  VTE Prophylaxis: Lovenox   Subjective:  Patient says she feels well. Denies abdominal pain, nausea, vomiting.   Objective: Temp:  [98.2 F (36.8 C)-98.7 F (37.1 C)] 98.4 F (36.9 C) (02/16 2354) Pulse Rate:  [96-106] 97 (02/16 2354) Resp:  [15-20] 19 (02/16 2354) BP: (108-128)/(71-92) 108/71 (02/16 2354) SpO2:  [97 %-100 %] 98 % (02/16 2354) Physical Exam: General: in no acute distress and well-appearing HEENT: normocephalic and atraumatic Respiratory: clear to auscultation bilaterally posteriorly, non-labored breathing, and on RA Extremities: moving all extremities spontaneously Gastrointestinal: non-distended and mildly tender to palpation in bilateral lower quadrants Cardiovascular: regular rate  Lowry Ram, MD 02/19/2022, 6:14 AM  PGY-1, [place] Intern pager: [phone], text pages welcome Secure chat group [facility] Teaching Service

## 2022-02-18 NOTE — Hospital Course (Signed)
Dawn [name] is a 22 y.o. female presenting with hyperglycemia . Differential for this patient's presentation of this includes HHS, DKA. Patient has DKA as she meets all of the criteria. Most likely due to medication non adherence, no other recent inciting illness.   DKA Glucose 473 on admission. Patient placed on endotool protocol overnight and anion gap closed. Discontinued Endotool protocol in AM and restarted on home long acting insulin. Patient stable on day of discharge.  Issues for PCP follow-up: Diabetes management, medication adherence

## 2022-02-18 NOTE — Discharge Summary (Signed)
[facility] Discharge Summary  Patient name: Dawn [name] Medical record number: [id number] Date of birth: 12/28/2000 Age: 22 y.o. Gender: female Date of Admission: 02/17/2022  Date of Discharge: 02/18/2022  Admitting Physician: Lind Covert, MD  Primary Care Provider: [name], MD Consultants: none  Indication for Hospitalization: DKA  Brief Hospital Course:  Dawn [name] is a 22 y.o. female presenting with hyperglycemia . Differential for this patient's presentation of this includes HHS, DKA. Patient has DKA as she meets all of the criteria. Most likely due to medication non adherence, no other recent inciting illness.   DKA Glucose 473 on admission. Patient placed on endotool protocol overnight and anion gap closed. Discontinued Endotool protocol in AM and restarted on home long acting insulin. Patient stable on day of discharge.  Issues for PCP follow-up: Diabetes management, medication adherence  Discharge Diagnoses/Problem List:  DKA (diabetic ketoacidosis) (Dawn [name])  Disposition: home vs community  Discharge Condition: stable  Discharge Exam:  BP 117/79 (BP Location: Left Arm)   Pulse (!) 106   Temp 98.7 F (37.1 C) (Oral)   Resp 20   Ht 5' 1.5" (1.562 m)   Wt 52.7 kg   SpO2 100%   BMI 21.60 kg/m  Physical Exam: General: in no acute distress and well-appearing HEENT: normocephalic and atraumatic Respiratory: clear to auscultation bilaterally posteriorly, non-labored breathing, and on RA Extremities: moving all extremities spontaneously Gastrointestinal: non-distended and mildly tender to palpation in bilateral lower quadrants Cardiovascular: regular rate  Significant Labs and Imaging:  Recent Labs  Lab 02/17/22 1537 02/17/22 2005 02/18/22 0710  WBC 7.1  --  6.2  HGB 12.8 12.6 10.7*  HCT 40.1 37.0 32.5*  PLT 386  --  316   Recent Labs  Lab 02/17/22 1537 02/17/22 1959 02/17/22 2005 02/18/22 0114  02/18/22 0850  NA 130* 132* 134* 137 134*  K 4.0 4.2 4.3 3.2* 4.1  CL 99 102  --  107 105  CO2 14* 15*  --  17* 20*  GLUCOSE 502* 403*  --  128* 303*  BUN 14 12  --  6 6  CREATININE 0.89 0.87  --  0.65 0.75  CALCIUM 8.9 8.8*  --  8.9 8.6*   Discharge Medications:  Allergies as of 02/18/2022       Reactions   Latex Itching, Swelling, Other (See Comments)   Causes skin irritation   Tape Other (See Comments)   Causes skin irritation   Shellfish Allergy Itching, Rash, Other (See Comments)   Reaction to scallops        Medication List     STOP taking these medications    blood glucose meter kit and supplies Replaced by: glucose blood test strip   Dexcom G6 Sensor Misc       TAKE these medications    Accu-Chek Guide Me w/Device Kit Use 3 (three) times daily before meals.   Accu-Chek Guide test strip Generic drug: glucose blood Use to check glucose 6 times daily What changed: Another medication with the same name was added. Make sure you understand how and when to take each.   glucose blood test strip Use up to four times daily as directed What changed: You were already taking a medication with the same name, and this prescription was added. Make sure you understand how and when to take each. Replaces: blood glucose meter kit and supplies   Accu-Chek Softclix Lancets lancets Please use to check blood sugar up to 6 times/day.  What changed: Another medication with the same name was added. Make sure you understand how and when to take each.   Fingerstix Lancets Misc Use up to four times daily What changed: You were already taking a medication with the same name, and this prescription was added. Make sure you understand how and when to take each.   famotidine 20 MG tablet Commonly known as: PEPCID Take 1 tablet (20 mg total) by mouth 2 (two) times daily.   HumaLOG KwikPen 100 UNIT/ML KwikPen Generic drug: insulin lispro Inject 5-15 Units into the skin 3 times  daily with meals- per sliding scale based on carb count. (See admin instructions.) What changed:  how much to take additional instructions   Pentips 32G X 4 MM Misc Generic drug: Insulin Pen Needle USE TO INJECT INSULIN 6 TIMES DAILY   polyethylene glycol powder 17 GM/SCOOP powder Commonly known as: GLYCOLAX/MIRALAX Take 1 capful (17 g) by mouth daily.   sertraline 25 MG tablet Commonly known as: ZOLOFT Take 3 tablets (75 mg total) by mouth daily.   SUMAtriptan 25 MG tablet Commonly known as: Imitrex Take 0.5 tablets (12.5 mg total) by mouth every 2 (two) hours as needed for migraine. May repeat in 2 hours if headache persists or recurs.   [name] FlexTouch 100 UNIT/ML FlexTouch Pen Generic drug: insulin degludec Inject 40 Units into the skin daily.      Discharge Instructions: Please refer to Patient Instructions section of EMR for full details.  Patient was counseled important signs and symptoms that should prompt return to medical care, changes in medications, dietary instructions, activity restrictions, and follow up appointments.   Follow-Up Appointments:  Follow-up Information     Dawn Laurel, DO Follow up on 03/04/2022.   Specialty: Endocrinology Why: 9:20 AM Contact information: [id number] [place] [address] [id number]         [name], MD. Schedule an appointment as soon as possible for a visit on 03/01/2022.   Specialty: Internal Medicine Why: [address] [place], [place] [address]  10 AM (arrive by 9:30 AM) Contact information: [place] [address] [place] [facility] Follow up on 02/23/2022.   Why: next wed at 1:30 for follow up. Please call them if you have transportation needs, case manager Contact information: [place] [id number] [address] [place],  [address]  CONTACT [phone]                Camelia Phenes, MD 02/18/2022, 12:57 PM PGY-1, [place]

## 2022-02-18 NOTE — Discharge Summary (Incomplete Revision)
[facility] Discharge Summary  Patient name: Dawn [name] Medical record number: [id number] Date of birth: 09/18/2000 Age: 22 y.o. Gender: female Date of Admission: 02/17/2022  Date of Discharge: 02/18/2022  Admitting Physician: Lind Covert, MD  Primary Care Provider: [name], MD Consultants: none  Indication for Hospitalization: DKA  Brief Hospital Course:  Dawn [name] is a 22 y.o. female presenting with hyperglycemia . Differential for this patient's presentation of this includes HHS, DKA. Patient has DKA as she meets all of the criteria. Most likely due to medication non adherence, no other recent inciting illness.   DKA Glucose 473 on admission. Patient placed on endotool protocol overnight and anion gap closed. Discontinued Endotool protocol in AM and restarted on home long acting insulin. Patient stable on day of discharge.  Issues for PCP follow-up: Diabetes management, medication adherence  Discharge Diagnoses/Problem List:  DKA (diabetic ketoacidosis) ([place])  Disposition: home vs community  Discharge Condition: stable  Discharge Exam:  BP 117/79 (BP Location: Left Arm)   Pulse (!) 106   Temp 98.7 F (37.1 C) (Oral)   Resp 20   Ht 5' 1.5" (1.562 m)   Wt 52.7 kg   SpO2 100%   BMI 21.60 kg/m  Physical Exam: General: in no acute distress and well-appearing HEENT: normocephalic and atraumatic Respiratory: clear to auscultation bilaterally posteriorly, non-labored breathing, and on RA Extremities: moving all extremities spontaneously Gastrointestinal: non-distended and mildly tender to palpation in bilateral lower quadrants Cardiovascular: regular rate  Significant Labs and Imaging:  Recent Labs  Lab 02/17/22 1537 02/17/22 2005 02/18/22 0710  WBC 7.1  --  6.2  HGB 12.8 12.6 10.7*  HCT 40.1 37.0 32.5*  PLT 386  --  316   Recent Labs  Lab 02/17/22 1537 02/17/22 1959 02/17/22 2005 02/18/22 0114  02/18/22 0850 02/18/22 1147  NA 130* 132* 134* 137 134* 132*  K 4.0 4.2 4.3 3.2* 4.1 4.5  CL 99 102  --  107 105 103  CO2 14* 15*  --  17* 20* 22  GLUCOSE 502* 403*  --  128* 303* 362*  BUN 14 12  --  6 6 6  $ CREATININE 0.89 0.87  --  0.65 0.75 0.85  CALCIUM 8.9 8.8*  --  8.9 8.6* 9.2   Discharge Medications:  Allergies as of 02/18/2022       Reactions   Latex Itching, Swelling, Other (See Comments)   Causes skin irritation   Tape Other (See Comments)   Causes skin irritation   Shellfish Allergy Itching, Rash, Other (See Comments)   Reaction to scallops        Medication List     STOP taking these medications    blood glucose meter kit and supplies Replaced by: Accu-Chek Guide test strip   Dexcom G6 Sensor Misc       TAKE these medications    Accu-Chek Guide test strip Generic drug: glucose blood Use to check glucose 6 times daily What changed: Another medication with the same name was added. Make sure you understand how and when to take each.   Accu-Chek Guide test strip Generic drug: glucose blood Use up to four times daily as directed What changed: You were already taking a medication with the same name, and this prescription was added. Make sure you understand how and when to take each. Replaces: blood glucose meter kit and supplies   Accu-Chek Guide w/Device Kit Use 3 (three) times daily before meals.   Accu-Chek  Softclix Lancets lancets Please use to check blood sugar up to 6 times/day. What changed: Another medication with the same name was added. Make sure you understand how and when to take each.   Accu-Chek Softclix Lancets lancets Use up to four times daily What changed: You were already taking a medication with the same name, and this prescription was added. Make sure you understand how and when to take each.   famotidine 20 MG tablet Commonly known as: PEPCID Take 1 tablet (20 mg total) by mouth 2 (two) times daily.   HumaLOG KwikPen 100  UNIT/ML KwikPen Generic drug: insulin lispro Inject 5-15 Units into the skin 3 times daily with meals- per sliding scale based on carb count. (See admin instructions.) What changed:  how much to take additional instructions   Pentips 32G X 4 MM Misc Generic drug: Insulin Pen Needle USE TO INJECT INSULIN 6 TIMES DAILY   polyethylene glycol powder 17 GM/SCOOP powder Commonly known as: GLYCOLAX/MIRALAX Take 1 capful (17 g) by mouth daily.   sertraline 25 MG tablet Commonly known as: ZOLOFT Take 3 tablets (75 mg total) by mouth daily.   SUMAtriptan 25 MG tablet Commonly known as: Imitrex Take 0.5 tablets (12.5 mg total) by mouth every 2 (two) hours as needed for migraine. May repeat in 2 hours if headache persists or recurs.   [name] FlexTouch 100 UNIT/ML FlexTouch Pen Generic drug: insulin degludec Inject 40 Units into the skin daily.      Discharge Instructions: Please refer to Patient Instructions section of EMR for full details.  Patient was counseled important signs and symptoms that should prompt return to medical care, changes in medications, dietary instructions, activity restrictions, and follow up appointments.   Follow-Up Appointments:  Follow-up Information     Mamie Laurel, DO Follow up on 03/04/2022.   Specialty: Endocrinology Why: 9:20 AM Contact information: [id number] [place] [address] [id number]         [name], MD. Schedule an appointment as soon as possible for a visit on 03/01/2022.   Specialty: Internal Medicine Why: [address] [place], [place] [address]  10 AM (arrive by 9:30 AM) Contact information: [place] [address] [place] [facility] Follow up on 02/23/2022.   Why: next wed at 1:30 for follow up. Please call them if you have transportation needs, case manager Contact information: [place] [id number] [place], [place]  [address]  CONTACT [phone]                Camelia Phenes, MD 02/18/2022, 2:17 PM PGY-1, [place]

## 2022-02-19 LAB — GLUCOSE, CAPILLARY
Glucose-Capillary: 170 mg/dL — ABNORMAL HIGH (ref 70–99)
Glucose-Capillary: 127 mg/dL — ABNORMAL HIGH (ref 70–99)

## 2022-02-19 NOTE — Plan of Care (Signed)

## 2022-02-19 NOTE — Discharge Summary (Signed)
[facility] Discharge Summary  Patient name: Dawn [name] Medical record number: [id number] Date of birth: [birth year] Age: 22 y.o. Gender: female Date of Admission: 02/17/2022  Date of Discharge: 02/19/2022  Admitting Physician: Dawn Covert, MD  Primary Care Provider: [name], MD Consultants: none  Indication for Hospitalization: DKA  Brief Hospital Course:  Dawn [name] is a 22 y.o. female presenting with hyperglycemia . Differential for this patient'[name] presentation of this includes HHS, DKA. Patient has DKA as she meets all of the criteria. Most likely due to medication non adherence, no other recent inciting illness.   DKA Glucose 473 on admission with gap of 17. Patient was not acidotic. Patient placed on endotool protocol and anion gap closed x 2. Potassium was repleted as appropriate. Discontinued Endotool protocol on 2/16 and restarted on 15 u BID long acting insulin along with mealtime and night coverage 3 units. Patient was discharged on home 40 u long acting insulin. Patient stable on day of discharge.  Issues for PCP follow-up: Diabetes management, medication adherence Dexcom and glucometer availability outpatient  Contraception counseling (Patient possibly interested in depo.)   Discharge Diagnoses/Problem List:  DKA (diabetic ketoacidosis) ([place])  Disposition: home vs community  Discharge Condition: stable  Discharge Exam:  BP 125/77 (BP Location: Left Arm)   Pulse 97   Temp 98.1 F (36.7 C) (Oral)   Resp 19   Ht 5' 1.5" (1.562 m)   Wt 52.7 kg   SpO2 98%   BMI 21.60 kg/m  Physical Exam: Per Dr. Althea Charon physical exam General: in no acute distress and well-appearing HEENT: normocephalic and atraumatic Respiratory: clear to auscultation bilaterally posteriorly, non-labored breathing, and on RA Extremities: moving all extremities spontaneously Gastrointestinal: non-distended and mildly tender to palpation in bilateral  lower quadrants Cardiovascular: regular rate  Significant Labs and Imaging:  Recent Labs  Lab 02/17/22 1537 02/17/22 2005 02/18/22 0710  WBC 7.1  --  6.2  HGB 12.8 12.6 10.7*  HCT 40.1 37.0 32.5*  PLT 386  --  316    Recent Labs  Lab 02/17/22 1537 02/17/22 1959 02/17/22 2005 02/18/22 0114 02/18/22 0850 02/18/22 1147 02/18/22 1537  NA 130* 132* 134* 137 134* 132*  --   K 4.0 4.2 4.3 3.2* 4.1 4.5  --   CL 99 102  --  107 105 103  --   CO2 14* 15*  --  17* 20* 22  --   GLUCOSE 502* 403*  --  128* 303* 362* 359*  BUN 14 12  --  6 6 6  $ --   CREATININE 0.89 0.87  --  0.65 0.75 0.85  --   CALCIUM 8.9 8.8*  --  8.9 8.6* 9.2  --     Discharge Medications:  Allergies as of 02/19/2022       Reactions   Latex Itching, Swelling, Other (See Comments)   Causes skin irritation   Tape Other (See Comments)   Causes skin irritation   Shellfish Allergy Itching, Rash, Other (See Comments)   Reaction to scallops        Medication List     STOP taking these medications    blood glucose meter kit and supplies Replaced by: Accu-Chek Guide test strip   Dexcom G6 Sensor Misc       TAKE these medications    Accu-Chek Guide test strip Generic drug: glucose blood Use to check glucose 6 times daily What changed: Another medication with the same name was added.  Make sure you understand how and when to take each.   Accu-Chek Guide test strip Generic drug: glucose blood Use up to four times daily as directed What changed: You were already taking a medication with the same name, and this prescription was added. Make sure you understand how and when to take each. Replaces: blood glucose meter kit and supplies   Accu-Chek Guide w/Device Kit Use 3 (three) times daily before meals.   Accu-Chek Softclix Lancets lancets Please use to check blood sugar up to 6 times/day. What changed: Another medication with the same name was added. Make sure you understand how and when to take  each.   Accu-Chek Softclix Lancets lancets Use up to four times daily What changed: You were already taking a medication with the same name, and this prescription was added. Make sure you understand how and when to take each.   famotidine 20 MG tablet Commonly known as: PEPCID Take 1 tablet (20 mg total) by mouth 2 (two) times daily.   HumaLOG KwikPen 100 UNIT/ML KwikPen Generic drug: insulin lispro Inject 5-15 Units into the skin 3 times daily with meals- per sliding scale based on carb count. (See admin instructions.) What changed:  how much to take additional instructions   Pentips 32G X 4 MM Misc Generic drug: Insulin Pen Needle USE TO INJECT INSULIN 6 TIMES DAILY   polyethylene glycol powder 17 GM/SCOOP powder Commonly known as: GLYCOLAX/MIRALAX Take 1 capful (17 g) by mouth daily.   sertraline 25 MG tablet Commonly known as: ZOLOFT Take 3 tablets (75 mg total) by mouth daily.   SUMAtriptan 25 MG tablet Commonly known as: Imitrex Take 0.5 tablets (12.5 mg total) by mouth every 2 (two) hours as needed for migraine. May repeat in 2 hours if headache persists or recurs.   [name] FlexTouch 100 UNIT/ML FlexTouch Pen Generic drug: insulin degludec Inject 40 Units into the skin daily.      Discharge Instructions: Please refer to Patient Instructions section of EMR for full details.  Patient was counseled important signs and symptoms that should prompt return to medical care, changes in medications, dietary instructions, activity restrictions, and follow up appointments.   Follow-Up Appointments:  Follow-up Information     Dawn Laurel, DO Follow up on 03/04/2022.   Specialty: Endocrinology Why: 9:20 AM Contact information: [id number] [place] [address] [id number]         Dawn Pointer, MD. Schedule an appointment as soon as possible for a visit on 03/01/2022.   Specialty: Internal Medicine Why: [address] [place], [place]  [address]  10 AM (arrive by 9:30 AM) Contact information: [place] [address] [place] [facility] Follow up on 02/23/2022.   Why: next wed at 1:30 for follow up. Please call them if you have transportation needs, case manager Contact information: [place] [id number] [place], [place] [address]  CONTACT [phone]                Gerrit Heck, MD 02/19/2022, 12:03 PM PGY-2, [place]

## 2022-02-19 NOTE — Progress Notes (Signed)
Discharge education completed, patient verbalized understanding of eduction provided. Blood glucose monitor and supplied delivered to patient, patient in possesion of supplies at discharge. All patient belongings with patient as discharge. IV discontinued.

## 2022-02-21 ENCOUNTER — Telehealth: Payer: Self-pay

## 2022-02-21 NOTE — Transitions of Care (Post Inpatient/ED Visit) (Unsigned)
   02/21/2022  Name: Dawn [name] MRN: [id number] DOB: 16-Jan-2000  Today's TOC FU Call Status: Today's TOC FU Call Status:: Unsuccessul Call (1st Attempt) Unsuccessful Call (1st Attempt) Date: 02/21/22  Attempted to reach the patient regarding the most recent Inpatient/ED visit.  Follow Up Plan: Additional outreach attempts will be made to reach the patient to complete the Transitions of Care (Post Inpatient/ED visit) call.   Signature Juanda Crumble, [place] Direct Dial [phone]

## 2022-02-22 NOTE — Transitions of Care (Post Inpatient/ED Visit) (Unsigned)
   02/22/2022  Name: Dawn [name] MRN: [id number] DOB: 13-Dec-2000  Today's TOC FU Call Status: Today's TOC FU Call Status:: Unsuccessful Call (2nd Attempt) Unsuccessful Call (1st Attempt) Date: 02/21/22 Unsuccessful Call (2nd Attempt) Date: 02/22/22  Attempted to reach the patient regarding the most recent Inpatient/ED visit.  Follow Up Plan: Additional outreach attempts will be made to reach the patient to complete the Transitions of Care (Post Inpatient/ED visit) call.   Signature Juanda Crumble, [place] Direct Dial [phone]

## 2022-02-23 NOTE — Transitions of Care (Post Inpatient/ED Visit) (Signed)
   02/23/2022  Name: Dawn [name] MRN: [id number] DOB: November 23, 2000  Today's TOC FU Call Status: Today's TOC FU Call Status:: Successful TOC FU Call Competed Unsuccessful Call (1st Attempt) Date: 02/21/22 Unsuccessful Call (2nd Attempt) Date: 02/22/22 [facility] FU Call Complete Date: 02/23/22  Transition Care Management Follow-up Telephone Call Date of Discharge: 02/19/22 Discharge [facility]) Type of Discharge: Inpatient Admission Primary Inpatient Discharge Diagnosis:: DM with ketoacidosis How have you been since you were released from the hospital?: Better Any questions or concerns?: No  Items Reviewed: Did you receive and understand the discharge instructions provided?: Yes Medications obtained and verified?: Yes (Medications Reviewed) Any new allergies since your discharge?: No Dietary orders reviewed?: Yes Do you have support at home?: No  Home Care and Equipment/Supplies: [place] Ordered?: NA Any new equipment or medical supplies ordered?: NA  Functional Questionnaire: Do you need assistance with bathing/showering or dressing?: No Do you need assistance with meal preparation?: No Do you need assistance with eating?: No Do you have difficulty maintaining continence: No Do you need assistance with getting out of bed/getting out of a chair/moving?: No Do you have difficulty managing or taking your medications?: No  Folllow up appointments reviewed: PCP Follow-up appointment confirmed?: NA (transferred to [facility]) [facility] Follow-up appointment confirmed?: Yes Date of Specialist follow-up appointment?: 03/04/22 Follow-Up Specialty Provider:: Dr Barton Dubois Do you need transportation to your follow-up appointment?: No Do you understand care options if your condition(s) worsen?: Yes-patient verbalized understanding    [place], [place] Direct Dial [phone]

## 2022-02-24 ENCOUNTER — Inpatient Hospital Stay: Payer: Self-pay | Admitting: Student

## 2022-03-05 ENCOUNTER — Emergency Department (HOSPITAL_COMMUNITY): Payer: Medicaid Other

## 2022-03-05 ENCOUNTER — Inpatient Hospital Stay (HOSPITAL_COMMUNITY)
Admission: EM | Admit: 2022-03-05 | Discharge: 2022-03-08 | DRG: 639 | Disposition: A | Discharge status: Home / Self Care | Payer: Medicaid Other | Attending: Internal Medicine | Admitting: Internal Medicine

## 2022-03-05 ENCOUNTER — Encounter (HOSPITAL_COMMUNITY): Payer: Self-pay

## 2022-03-05 ENCOUNTER — Other Ambulatory Visit: Payer: Self-pay

## 2022-03-05 DIAGNOSIS — Z91040 Latex allergy status: Secondary | ICD-10-CM

## 2022-03-05 DIAGNOSIS — Z8639 Personal history of other endocrine, nutritional and metabolic disease: Secondary | ICD-10-CM

## 2022-03-05 DIAGNOSIS — Z794 Long term (current) use of insulin: Secondary | ICD-10-CM

## 2022-03-05 DIAGNOSIS — Z825 Family history of asthma and other chronic lower respiratory diseases: Secondary | ICD-10-CM

## 2022-03-05 DIAGNOSIS — Z91199 Patient's noncompliance with other medical treatment and regimen due to unspecified reason: Secondary | ICD-10-CM

## 2022-03-05 DIAGNOSIS — Z91048 Other nonmedicinal substance allergy status: Secondary | ICD-10-CM

## 2022-03-05 DIAGNOSIS — Z79899 Other long term (current) drug therapy: Secondary | ICD-10-CM

## 2022-03-05 DIAGNOSIS — E1010 Type 1 diabetes mellitus with ketoacidosis without coma: Principal | ICD-10-CM | POA: Diagnosis present

## 2022-03-05 DIAGNOSIS — E876 Hypokalemia: Secondary | ICD-10-CM | POA: Diagnosis present

## 2022-03-05 DIAGNOSIS — Z91013 Allergy to seafood: Secondary | ICD-10-CM

## 2022-03-05 DIAGNOSIS — H5789 Other specified disorders of eye and adnexa: Secondary | ICD-10-CM | POA: Diagnosis present

## 2022-03-05 DIAGNOSIS — F909 Attention-deficit hyperactivity disorder, unspecified type: Secondary | ICD-10-CM | POA: Diagnosis present

## 2022-03-05 DIAGNOSIS — J45909 Unspecified asthma, uncomplicated: Secondary | ICD-10-CM | POA: Diagnosis present

## 2022-03-05 DIAGNOSIS — Z833 Family history of diabetes mellitus: Secondary | ICD-10-CM

## 2022-03-05 LAB — CBC
HCT: 43.1 % (ref 36.0–46.0)
Hemoglobin: 12.7 g/dL (ref 12.0–15.0)
MCH: 28.3 pg (ref 26.0–34.0)
MCHC: 29.5 g/dL — ABNORMAL LOW (ref 30.0–36.0)
MCV: 96.2 fL (ref 80.0–100.0)
Platelets: 264 10*3/uL (ref 150–400)
RBC: 4.48 MIL/uL (ref 3.87–5.11)
RDW: 13.7 % (ref 11.5–15.5)
WBC: 8.2 10*3/uL (ref 4.0–10.5)
nRBC: 0 % (ref 0.0–0.2)

## 2022-03-05 LAB — BLOOD GAS, VENOUS
Acid-base deficit: 25 mmol/L — ABNORMAL HIGH (ref 0.0–2.0)
Bicarbonate: 4.5 mmol/L — ABNORMAL LOW (ref 20.0–28.0)
O2 Saturation: 74.5 %
Patient temperature: 37
pCO2, Ven: 18 mmHg — CL (ref 44–60)
pH, Ven: 7.01 — CL (ref 7.25–7.43)
pO2, Ven: 51 mmHg — ABNORMAL HIGH (ref 32–45)

## 2022-03-05 LAB — I-STAT BETA HCG BLOOD, ED (MC, WL, AP ONLY): I-stat hCG, quantitative: <5 m[IU]/mL (ref <5)

## 2022-03-05 LAB — CBG MONITORING, ED: Glucose-Capillary: 590 mg/dL (ref 70–99)

## 2022-03-05 LAB — LACTIC ACID, PLASMA: Lactic Acid, Venous: 1.3 mmol/L (ref 0.5–1.9)

## 2022-03-05 MED ORDER — LACTATED RINGERS IV BOLUS
1000.0000 mL | Freq: Once | INTRAVENOUS | Status: AC
  Administered 2022-03-05: 1000 mL via INTRAVENOUS

## 2022-03-05 NOTE — ED Provider Triage Note (Signed)
Emergency Medicine Provider Triage Evaluation Note  Dawn [name] , a 22 y.o. female  was evaluated in triage.  Pt complains of weakness, shortness of breath, high blood sugar. Also concerned she could be pregnant. LMP one month ago. Hx DM1, reports taking insulin as normal but it has been poorly controlled, recently admitted for DKA 2 months ago.  Review of Systems  Positive: Weakness, shob, frequent urination Negative: Chest pain, fever  Physical Exam  BP 122/81   Pulse (!) 118   Temp 98.1 F (36.7 C) (Oral)   Resp 19   SpO2 98%  Gen:   Awake, no distress   Resp:  Normal effort  MSK:   Moves extremities without difficulty  Other:    Medical Decision Making  Medically screening exam initiated at 9:19 PM.  Appropriate orders placed.  Dawn [name] was informed that the remainder of the evaluation will be completed by another provider, this initial triage assessment does not replace that evaluation, and the importance of remaining in the ED until their evaluation is complete.  Workup initiated in triage    Dawn [name] 03/05/22 2121

## 2022-03-05 NOTE — ED Triage Notes (Signed)
Pt. Arrives POV for hyperglycemia. Pt. States that it has been hard to control her blood sugars lately, even though she has been taking her insulin as prescribed. Pt. Endorses weakness, nausea, and SOB. Pt. Also wanting to be tested for pregnancy.

## 2022-03-05 NOTE — ED Provider Notes (Signed)
[facility] Provider Note   CSN: [id number] Arrival date & time: 03/05/22  2102     History {Add pertinent medical, surgical, social history, OB history to HPI:1} Chief Complaint  Patient presents with   Hyperglycemia    Dawn [name] is a 22 y.o. female.  22 year old female with history of diabetes, recently admitted for DKA presents with complaint of high blood sugar and concern for DKA.  Reports went to her endocrinologist yesterday, is taking her insulin as prescribed but reports poorly controlled diabetes.  Denies nausea, vomiting, fevers or chills.  Denies polydipsia or polyuria.       Home Medications Prior to Admission medications   Medication Sig Start Date End Date Taking? Authorizing Provider  Accu-Chek Softclix Lancets lancets Please use to check blood sugar up to 6 times/day. 08/05/21   August Albino, MD  Blood Glucose Monitoring Suppl (ACCU-CHEK GUIDE ME) w/Device KIT Use 3 (three) times daily before meals. 02/18/22   Holley Bouche, MD  famotidine (PEPCID) 20 MG tablet Take 1 tablet (20 mg total) by mouth 2 (two) times daily. 02/04/22   Audley Hose, MD  Fingerstix Lancets MISC Use up to four times daily 02/18/22   Lind Covert, MD  glucose blood (ACCU-CHEK GUIDE) test strip Use to check glucose 6 times daily 08/05/21   August Albino, MD  glucose blood test strip Use up to four times daily as directed 02/18/22   Holley Bouche, MD  insulin lispro (HUMALOG KWIKPEN) 100 UNIT/ML KwikPen Inject 5-15 Units into the skin 3 times daily with meals- per sliding scale based on carb count. (See admin instructions.) Patient taking differently: Inject 5-20 Units into the skin See admin instructions. Inject 5-20 units into the skin three times a day with meals, per sliding scale 08/05/21 12/15/21  August Albino, MD  Insulin Pen Needle (BD PEN NEEDLE NANO 2ND GEN) 32G X 4 MM MISC USE TO INJECT INSULIN 6 TIMES DAILY 08/05/21   August Albino, MD  polyethylene glycol powder (GLYCOLAX/MIRALAX) 17 GM/SCOOP powder Take 1 capful (17 g) by mouth daily. 12/15/21   Wells Guiles, DO  sertraline (ZOLOFT) 25 MG tablet Take 3 tablets (75 mg total) by mouth daily. 09/03/21   Sharion Settler, DO  SUMAtriptan (IMITREX) 25 MG tablet Take 0.5 tablets (12.5 mg total) by mouth every 2 (two) hours as needed for migraine. May repeat in 2 hours if headache persists or recurs. 04/12/21   [name], MD  [name] FLEXTOUCH 100 UNIT/ML FlexTouch Pen Inject 40 Units into the skin daily. 11/09/21   [provider]      Allergies    Latex, Tape, and Shellfish allergy    Review of Systems   Review of Systems Negative except as per HPI Physical Exam Updated Vital Signs BP 122/81   Pulse (!) 118   Temp 98.1 F (36.7 C) (Oral)   Resp 19   LMP 02/16/2022 (Approximate)   SpO2 98%  Physical Exam Vitals and nursing note reviewed.  Constitutional:      General: She is not in acute distress.    Appearance: She is well-developed. She is not diaphoretic.  HENT:     Head: Normocephalic and atraumatic.     Mouth/Throat:     Mouth: Mucous membranes are dry.  Eyes:     Conjunctiva/sclera: Conjunctivae normal.  Cardiovascular:     Rate and Rhythm: Regular rhythm. Tachycardia present.     Heart sounds: Normal heart sounds.  Pulmonary:  Effort: Pulmonary effort is normal. Tachypnea present.     Breath sounds: Normal breath sounds.  Abdominal:     Palpations: Abdomen is soft.     Tenderness: There is no abdominal tenderness.  Musculoskeletal:     Cervical back: Neck supple.     Right lower leg: No edema.     Left lower leg: No edema.  Skin:    General: Skin is warm and dry.     Findings: No erythema or rash.  Neurological:     Mental Status: She is alert and oriented to person, place, and time.  Psychiatric:        Behavior: Behavior normal.     ED Results / Procedures / Treatments   Labs (all labs ordered are listed, but  only abnormal results are displayed) Labs Reviewed  CBG MONITORING, ED - Abnormal; Notable for the following components:      Result Value   Glucose-Capillary 590 (*)    All other components within normal limits  CBC  BASIC METABOLIC PANEL  BLOOD GAS, VENOUS  BETA-HYDROXYBUTYRIC ACID  LACTIC ACID, PLASMA  LACTIC ACID, PLASMA  I-STAT BETA HCG BLOOD, ED ([facility], [facility], [facility] ONLY)    EKG None  Radiology DG Chest 2 View  Result Date: 03/05/2022 CLINICAL DATA:  Shortness of breath EXAM: CHEST - 2 VIEW COMPARISON:  10/17/2021 FINDINGS: The heart size and mediastinal contours are within normal limits. Both lungs are clear. The visualized skeletal structures are unremarkable. IMPRESSION: Normal study. Electronically Signed   By: Rolm Baptise M.D.   On: 03/05/2022 21:54    Procedures Procedures  {Document cardiac monitor, telemetry assessment procedure when appropriate:1}  Medications Ordered in ED Medications  lactated ringers bolus 1,000 mL (has no administration in time range)    ED Course/ Medical Decision Making/ A&P   {   Click here for ABCD2, HEART and other calculatorsREFRESH Note before signing :1}                          Medical Decision Making  This patient presents to the ED for concern of hyperglycemia, this involves an extensive number of treatment options, and is a complaint that carries with it a high risk of complications and morbidity.  The differential diagnosis includes ***   Co morbidities that complicate the patient evaluation  Diabetes, history of DKA   Additional history obtained:  External records from outside source obtained and reviewed including ***   Lab Tests:  I Ordered, and personally interpreted labs.  The pertinent results include:  ***   Imaging Studies ordered:  I ordered imaging studies including ***  I independently visualized and interpreted imaging which showed *** I agree with the radiologist interpretation   Cardiac Monitoring: /  EKG:  The patient was maintained on a cardiac monitor.  I personally viewed and interpreted the cardiac monitored which showed an underlying rhythm of: ***   Consultations Obtained:  I requested consultation with the ***,  and discussed lab and imaging findings as well as pertinent plan - they recommend: ***   Problem List / ED Course / Critical interventions / Medication management  *** I ordered medication including ***  for ***  Reevaluation of the patient after these medicines showed that the patient {resolved/improved/worsened:23923::"improved"} I have reviewed the patients home medicines and have made adjustments as needed   Social Determinants of Health:  ***   Test / Admission - Considered:  ***   {  Document critical care time when appropriate:1} {Document review of labs and clinical decision tools ie heart score, Chads2Vasc2 etc:1}  {Document your independent review of radiology images, and any outside records:1} {Document your discussion with family members, caretakers, and with consultants:1} {Document social determinants of health affecting pt's care:1} {Document your decision making why or why not admission, treatments were needed:1} Final Clinical Impression(s) / ED Diagnoses Final diagnoses:  None    Rx / DC Orders ED Discharge Orders     None

## 2022-03-06 DIAGNOSIS — Z794 Long term (current) use of insulin: Secondary | ICD-10-CM | POA: Diagnosis not present

## 2022-03-06 DIAGNOSIS — F909 Attention-deficit hyperactivity disorder, unspecified type: Secondary | ICD-10-CM | POA: Diagnosis present

## 2022-03-06 DIAGNOSIS — E1010 Type 1 diabetes mellitus with ketoacidosis without coma: Secondary | ICD-10-CM

## 2022-03-06 DIAGNOSIS — Z825 Family history of asthma and other chronic lower respiratory diseases: Secondary | ICD-10-CM | POA: Diagnosis not present

## 2022-03-06 DIAGNOSIS — Z833 Family history of diabetes mellitus: Secondary | ICD-10-CM | POA: Diagnosis not present

## 2022-03-06 DIAGNOSIS — Z8639 Personal history of other endocrine, nutritional and metabolic disease: Secondary | ICD-10-CM | POA: Diagnosis not present

## 2022-03-06 DIAGNOSIS — J45909 Unspecified asthma, uncomplicated: Secondary | ICD-10-CM | POA: Diagnosis present

## 2022-03-06 DIAGNOSIS — Z79899 Other long term (current) drug therapy: Secondary | ICD-10-CM | POA: Diagnosis not present

## 2022-03-06 DIAGNOSIS — Z91199 Patient's noncompliance with other medical treatment and regimen due to unspecified reason: Secondary | ICD-10-CM | POA: Diagnosis not present

## 2022-03-06 DIAGNOSIS — Z91013 Allergy to seafood: Secondary | ICD-10-CM | POA: Diagnosis not present

## 2022-03-06 DIAGNOSIS — E876 Hypokalemia: Secondary | ICD-10-CM | POA: Diagnosis present

## 2022-03-06 DIAGNOSIS — Z91040 Latex allergy status: Secondary | ICD-10-CM | POA: Diagnosis not present

## 2022-03-06 DIAGNOSIS — H5789 Other specified disorders of eye and adnexa: Secondary | ICD-10-CM | POA: Diagnosis present

## 2022-03-06 DIAGNOSIS — Z91048 Other nonmedicinal substance allergy status: Secondary | ICD-10-CM | POA: Diagnosis not present

## 2022-03-06 LAB — BASIC METABOLIC PANEL
Anion gap: 20 — ABNORMAL HIGH (ref 5–15)
Anion gap: 16 — ABNORMAL HIGH (ref 5–15)
Anion gap: 12 (ref 5–15)
Anion gap: 12 (ref 5–15)
BUN: 24 mg/dL — ABNORMAL HIGH (ref 6–20)
BUN: 19 mg/dL (ref 6–20)
BUN: 15 mg/dL (ref 6–20)
BUN: 14 mg/dL (ref 6–20)
BUN: 11 mg/dL (ref 6–20)
CO2: <7 mmol/L — ABNORMAL LOW (ref 22–32)
CO2: 8 mmol/L — ABNORMAL LOW (ref 22–32)
CO2: 11 mmol/L — ABNORMAL LOW (ref 22–32)
CO2: 15 mmol/L — ABNORMAL LOW (ref 22–32)
CO2: 15 mmol/L — ABNORMAL LOW (ref 22–32)
Calcium: 9.3 mg/dL (ref 8.9–10.3)
Calcium: 9.2 mg/dL (ref 8.9–10.3)
Calcium: 8.8 mg/dL — ABNORMAL LOW (ref 8.9–10.3)
Calcium: 8.9 mg/dL (ref 8.9–10.3)
Calcium: 8.6 mg/dL — ABNORMAL LOW (ref 8.9–10.3)
Chloride: 100 mmol/L (ref 98–111)
Chloride: 114 mmol/L — ABNORMAL HIGH (ref 98–111)
Chloride: 114 mmol/L — ABNORMAL HIGH (ref 98–111)
Chloride: 116 mmol/L — ABNORMAL HIGH (ref 98–111)
Chloride: 111 mmol/L (ref 98–111)
Creatinine, Ser: 1.1 mg/dL — ABNORMAL HIGH (ref 0.44–1.00)
Creatinine, Ser: 0.95 mg/dL (ref 0.44–1.00)
Creatinine, Ser: 0.89 mg/dL (ref 0.44–1.00)
Creatinine, Ser: 0.81 mg/dL (ref 0.44–1.00)
Creatinine, Ser: 0.63 mg/dL (ref 0.44–1.00)
GFR, Estimated: >60 mL/min (ref >60)
GFR, Estimated: >60 mL/min (ref >60)
GFR, Estimated: >60 mL/min (ref >60)
GFR, Estimated: >60 mL/min (ref >60)
GFR, Estimated: >60 mL/min (ref >60)
Glucose, Bld: 587 mg/dL (ref 70–99)
Glucose, Bld: 189 mg/dL — ABNORMAL HIGH (ref 70–99)
Glucose, Bld: 173 mg/dL — ABNORMAL HIGH (ref 70–99)
Glucose, Bld: 168 mg/dL — ABNORMAL HIGH (ref 70–99)
Glucose, Bld: 183 mg/dL — ABNORMAL HIGH (ref 70–99)
Potassium: 5.2 mmol/L — ABNORMAL HIGH (ref 3.5–5.1)
Potassium: 4.3 mmol/L (ref 3.5–5.1)
Potassium: 3.7 mmol/L (ref 3.5–5.1)
Potassium: 3.5 mmol/L (ref 3.5–5.1)
Potassium: 3.4 mmol/L — ABNORMAL LOW (ref 3.5–5.1)
Sodium: 132 mmol/L — ABNORMAL LOW (ref 135–145)
Sodium: 142 mmol/L (ref 135–145)
Sodium: 141 mmol/L (ref 135–145)
Sodium: 143 mmol/L (ref 135–145)
Sodium: 138 mmol/L (ref 135–145)

## 2022-03-06 LAB — GLUCOSE, CAPILLARY
Glucose-Capillary: 516 mg/dL (ref 70–99)
Glucose-Capillary: 378 mg/dL — ABNORMAL HIGH (ref 70–99)
Glucose-Capillary: 474 mg/dL — ABNORMAL HIGH (ref 70–99)
Glucose-Capillary: 251 mg/dL — ABNORMAL HIGH (ref 70–99)
Glucose-Capillary: 175 mg/dL — ABNORMAL HIGH (ref 70–99)
Glucose-Capillary: 161 mg/dL — ABNORMAL HIGH (ref 70–99)
Glucose-Capillary: 185 mg/dL — ABNORMAL HIGH (ref 70–99)
Glucose-Capillary: 170 mg/dL — ABNORMAL HIGH (ref 70–99)
Glucose-Capillary: 187 mg/dL — ABNORMAL HIGH (ref 70–99)
Glucose-Capillary: 154 mg/dL — ABNORMAL HIGH (ref 70–99)
Glucose-Capillary: 165 mg/dL — ABNORMAL HIGH (ref 70–99)
Glucose-Capillary: 180 mg/dL — ABNORMAL HIGH (ref 70–99)
Glucose-Capillary: 146 mg/dL — ABNORMAL HIGH (ref 70–99)
Glucose-Capillary: 144 mg/dL — ABNORMAL HIGH (ref 70–99)
Glucose-Capillary: 155 mg/dL — ABNORMAL HIGH (ref 70–99)
Glucose-Capillary: 142 mg/dL — ABNORMAL HIGH (ref 70–99)

## 2022-03-06 LAB — URINALYSIS, ROUTINE W REFLEX MICROSCOPIC
Bacteria, UA: NONE SEEN
Bilirubin Urine: NEGATIVE
Glucose, UA: >=500 mg/dL — AB
Hgb urine dipstick: NEGATIVE
Ketones, ur: 80 mg/dL — AB
Leukocytes,Ua: NEGATIVE
Nitrite: NEGATIVE
Protein, ur: NEGATIVE mg/dL
Specific Gravity, Urine: 1.018 (ref 1.005–1.030)
pH: 5 (ref 5.0–8.0)

## 2022-03-06 LAB — CBG MONITORING, ED: Glucose-Capillary: 494 mg/dL — ABNORMAL HIGH (ref 70–99)

## 2022-03-06 LAB — BETA-HYDROXYBUTYRIC ACID
Beta-Hydroxybutyric Acid: >8 mmol/L — ABNORMAL HIGH (ref 0.05–0.27)
Beta-Hydroxybutyric Acid: 6.54 mmol/L — ABNORMAL HIGH (ref 0.05–0.27)
Beta-Hydroxybutyric Acid: 4.97 mmol/L — ABNORMAL HIGH (ref 0.05–0.27)

## 2022-03-06 LAB — MRSA NEXT GEN BY PCR, NASAL: MRSA by PCR Next Gen: NOT DETECTED

## 2022-03-06 MED ORDER — INSULIN REGULAR(HUMAN) IN NACL 100-0.9 UT/100ML-% IV SOLN
INTRAVENOUS | Status: DC
  Administered 2022-03-06: 7.5 [IU]/h via INTRAVENOUS
  Filled 2022-03-06: qty 100

## 2022-03-06 MED ORDER — ACETAMINOPHEN 325 MG PO TABS
650.0000 mg | ORAL_TABLET | Freq: Four times a day (QID) | ORAL | Status: DC | PRN
  Administered 2022-03-06 – 2022-03-07 (×2): 650 mg via ORAL
  Filled 2022-03-06 (×3): qty 2

## 2022-03-06 MED ORDER — DEXTROSE IN LACTATED RINGERS 5 % IV SOLN: INTRAVENOUS | Status: DC

## 2022-03-06 MED ORDER — LACTATED RINGERS IV BOLUS
1000.0000 mL | Freq: Once | INTRAVENOUS | Status: AC
  Administered 2022-03-06: 1000 mL via INTRAVENOUS

## 2022-03-06 MED ORDER — CHLORHEXIDINE GLUCONATE CLOTH 2 % EX PADS
6.0000 | MEDICATED_PAD | Freq: Every day | CUTANEOUS | Status: DC
  Administered 2022-03-06 – 2022-03-08 (×4): 6 via TOPICAL

## 2022-03-06 MED ORDER — ONDANSETRON HCL 4 MG/2ML IJ SOLN
4.0000 mg | Freq: Four times a day (QID) | INTRAMUSCULAR | Status: DC | PRN
  Administered 2022-03-06: 4 mg via INTRAVENOUS
  Filled 2022-03-06: qty 2

## 2022-03-06 MED ORDER — SUMATRIPTAN SUCCINATE 25 MG PO TABS
12.5000 mg | ORAL_TABLET | ORAL | Status: DC | PRN
  Administered 2022-03-06: 12.5 mg via ORAL
  Filled 2022-03-06 (×2): qty 1

## 2022-03-06 MED ORDER — ENOXAPARIN SODIUM 40 MG/0.4ML IJ SOSY
40.0000 mg | PREFILLED_SYRINGE | INTRAMUSCULAR | Status: DC
  Filled 2022-03-06: qty 0.4

## 2022-03-06 MED ORDER — DEXTROSE 50 % IV SOLN: 0.0000 mL | INTRAVENOUS | Status: DC | PRN

## 2022-03-06 MED ORDER — ACETAMINOPHEN 650 MG RE SUPP: 650.0000 mg | RECTAL | Status: DC | PRN

## 2022-03-06 MED ORDER — LACTATED RINGERS IV SOLN: INTRAVENOUS | Status: DC

## 2022-03-06 MED ORDER — ORAL CARE MOUTH RINSE: 15.0000 mL | OROMUCOSAL | Status: DC | PRN

## 2022-03-06 NOTE — ED Notes (Addendum)
Pt. CBG 494, RN made aware.

## 2022-03-06 NOTE — Assessment & Plan Note (Addendum)
Pt with DKA once again, extensive history of admissions frequently for same.  Felt to be due to non-adhearance to insulin regimen on multiple prior notes.  Most recently just admitted for same 2 weeks ago. She says she is taking her insulin and surprised she's back in DKA today. DKA pathway IVF per pathway Will add another 1L bolus due to persistent tachycardia. Insulin gtt BMP Q4H BHB > 8.0 pH 7.0 today Tele monitor

## 2022-03-06 NOTE — Inpatient Diabetes Management (Signed)
Inpatient Diabetes Program Recommendations  AACE/ADA: New Consensus Statement on Inpatient Glycemic Control (2015)  Target Ranges:  Prepandial:   less than 140 mg/dL      Peak postprandial:   less than 180 mg/dL (1-2 hours)      Critically ill patients:  140 - 180 mg/dL   Lab Results  Component Value Date   GLUCAP 154 (H) 03/06/2022   HGBA1C 12.2 (H) 02/18/2022    Review of Glycemic Control  Latest Reference Range & Units 03/06/22 05:43 03/06/22 06:39 03/06/22 07:43 03/06/22 08:42 03/06/22 09:41 03/06/22 10:48  Glucose-Capillary 70 - 99 mg/dL 175 (H) 161 (H) 185 (H) 170 (H) 187 (H) 154 (H)   Diabetes history: DM 1 (requires basal bolus insulin to correct glucose levels and cover carbohydrates consumed) Outpatient Diabetes medications: [name] 40 units Daily, Humalog 0-8 units tid Current orders for Inpatient glycemic control:  IV insulin/DKA/Endotool  Note: pt is well known to our team. Was just here with DKA and I gave patient a dexcom CGM for glucose trends for home use, Pt had Endocrinology visit on 3/1. Has been admitted 6 times in the last 7 months with DKA. Per Endocrine notes pt has barriers such as depression and transportation issues. Unsure of reasons this admission as pt just had an appointment the day prior.  A1c 12.2% on 2/16  Inpatient Diabetes Program Recommendations:    Agree with IV insulin for now until acidosis clears.  At time of transition consider: -  Semglee 30 units -  Novolog 0-9 units tid + hs scale -  Novolog 3 units tid meal coverage if eating >50% of meals  Thanks,  Tama Headings RN, MSN, BC-ADM Inpatient Diabetes Coordinator Team Pager [phone] (8a-5p)
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
## 2022-03-06 NOTE — Progress Notes (Signed)
Brief progress note: -Patient was admitted earlier today. -As per H&P done on admission: "Dawn [name] is a 22 y.o. female with medical history significant of DM1, poorly controlled with frequent admissions for DKA due to suspected non-adherence to medication regimen.   Most recently pt admitted 2 weeks ago for DKA once again.   Pt went to endocrinologist yesterday.   Today presents to ED with c/o hyperglycemia, nausea, and SOB.   Lab work up is positive for DKA in the ED.   Diabetic ketoacidosis without coma associated with type 1 diabetes mellitus ([place]) Pt with DKA once again, extensive history of admissions frequently for same.  Felt to be due to non-adhearance to insulin regimen on multiple prior notes.  Most recently just admitted for same 2 weeks ago. She says she is taking her insulin and surprised she's back in DKA today. DKA pathway IVF per pathway Will add another 1L bolus due to persistent tachycardia. Insulin gtt BMP Q4H BHB > 8.0 pH 7.0 today Tele monitor"   -DKA is slowly resolving. -Continue to use protocol and transition accordingly. -Will continue to monitor electrolytes, anion gap, volume status and blood sugar and manage accordingly.

## 2022-03-07 LAB — GLUCOSE, CAPILLARY
Glucose-Capillary: 157 mg/dL — ABNORMAL HIGH (ref 70–99)
Glucose-Capillary: 181 mg/dL — ABNORMAL HIGH (ref 70–99)
Glucose-Capillary: 203 mg/dL — ABNORMAL HIGH (ref 70–99)
Glucose-Capillary: 170 mg/dL — ABNORMAL HIGH (ref 70–99)
Glucose-Capillary: 179 mg/dL — ABNORMAL HIGH (ref 70–99)
Glucose-Capillary: 198 mg/dL — ABNORMAL HIGH (ref 70–99)
Glucose-Capillary: 135 mg/dL — ABNORMAL HIGH (ref 70–99)
Glucose-Capillary: 119 mg/dL — ABNORMAL HIGH (ref 70–99)
Glucose-Capillary: 147 mg/dL — ABNORMAL HIGH (ref 70–99)
Glucose-Capillary: 257 mg/dL — ABNORMAL HIGH (ref 70–99)
Glucose-Capillary: 425 mg/dL — ABNORMAL HIGH (ref 70–99)
Glucose-Capillary: 444 mg/dL — ABNORMAL HIGH (ref 70–99)
Glucose-Capillary: 182 mg/dL — ABNORMAL HIGH (ref 70–99)

## 2022-03-07 LAB — BASIC METABOLIC PANEL
Anion gap: 10 (ref 5–15)
Anion gap: 8 (ref 5–15)
Anion gap: 9 (ref 5–15)
BUN: 8 mg/dL (ref 6–20)
BUN: 6 mg/dL (ref 6–20)
BUN: 9 mg/dL (ref 6–20)
CO2: 19 mmol/L — ABNORMAL LOW (ref 22–32)
CO2: 21 mmol/L — ABNORMAL LOW (ref 22–32)
CO2: 19 mmol/L — ABNORMAL LOW (ref 22–32)
Calcium: 8.3 mg/dL — ABNORMAL LOW (ref 8.9–10.3)
Calcium: 8.4 mg/dL — ABNORMAL LOW (ref 8.9–10.3)
Calcium: 9.2 mg/dL (ref 8.9–10.3)
Chloride: 107 mmol/L (ref 98–111)
Chloride: 110 mmol/L (ref 98–111)
Chloride: 108 mmol/L (ref 98–111)
Creatinine, Ser: 0.62 mg/dL (ref 0.44–1.00)
Creatinine, Ser: 0.5 mg/dL (ref 0.44–1.00)
Creatinine, Ser: 0.62 mg/dL (ref 0.44–1.00)
GFR, Estimated: >60 mL/min (ref >60)
GFR, Estimated: >60 mL/min (ref >60)
GFR, Estimated: >60 mL/min (ref >60)
Glucose, Bld: 172 mg/dL — ABNORMAL HIGH (ref 70–99)
Glucose, Bld: 197 mg/dL — ABNORMAL HIGH (ref 70–99)
Glucose, Bld: 271 mg/dL — ABNORMAL HIGH (ref 70–99)
Potassium: 2.4 mmol/L — CL (ref 3.5–5.1)
Potassium: 3.4 mmol/L — ABNORMAL LOW (ref 3.5–5.1)
Potassium: 3.9 mmol/L (ref 3.5–5.1)
Sodium: 136 mmol/L (ref 135–145)
Sodium: 139 mmol/L (ref 135–145)
Sodium: 136 mmol/L (ref 135–145)

## 2022-03-07 LAB — LIPID PANEL
Cholesterol: 136 mg/dL (ref 0–200)
HDL: 27 mg/dL — ABNORMAL LOW (ref >40)
LDL Cholesterol: 72 mg/dL (ref 0–99)
Total CHOL/HDL Ratio: 5 RATIO
Triglycerides: 184 mg/dL — ABNORMAL HIGH (ref <150)
VLDL: 37 mg/dL (ref 0–40)

## 2022-03-07 LAB — BETA-HYDROXYBUTYRIC ACID
Beta-Hydroxybutyric Acid: 2.09 mmol/L — ABNORMAL HIGH (ref 0.05–0.27)
Beta-Hydroxybutyric Acid: 1.31 mmol/L — ABNORMAL HIGH (ref 0.05–0.27)

## 2022-03-07 MED ORDER — NAPHAZOLINE-PHENIRAMINE 0.025-0.3 % OP SOLN
2.0000 [drp] | Freq: Four times a day (QID) | OPHTHALMIC | Status: DC | PRN
  Administered 2022-03-07 – 2022-03-08 (×4): 2 [drp] via OPHTHALMIC
  Filled 2022-03-07: qty 15

## 2022-03-07 MED ORDER — INSULIN ASPART 100 UNIT/ML IJ SOLN: 0.0000 [IU] | Freq: Every day | INTRAMUSCULAR | Status: DC

## 2022-03-07 MED ORDER — POTASSIUM CHLORIDE 10 MEQ/100ML IV SOLN
10.0000 meq | INTRAVENOUS | Status: AC
  Administered 2022-03-07 (×6): 10 meq via INTRAVENOUS
  Filled 2022-03-07 (×5): qty 100

## 2022-03-07 MED ORDER — INSULIN GLARGINE-YFGN 100 UNIT/ML ~~LOC~~ SOLN
10.0000 [IU] | SUBCUTANEOUS | Status: AC
  Administered 2022-03-07: 10 [IU] via SUBCUTANEOUS
  Filled 2022-03-07: qty 0.1

## 2022-03-07 MED ORDER — INSULIN GLARGINE-YFGN 100 UNIT/ML ~~LOC~~ SOLN
20.0000 [IU] | Freq: Every day | SUBCUTANEOUS | Status: DC
  Administered 2022-03-08: 20 [IU] via SUBCUTANEOUS
  Filled 2022-03-07: qty 0.2

## 2022-03-07 MED ORDER — INSULIN ASPART 100 UNIT/ML IJ SOLN
0.0000 [IU] | Freq: Three times a day (TID) | INTRAMUSCULAR | Status: DC
  Administered 2022-03-07: 5 [IU] via SUBCUTANEOUS
  Administered 2022-03-07: 9 [IU] via SUBCUTANEOUS
  Administered 2022-03-08: 3 [IU] via SUBCUTANEOUS
  Administered 2022-03-08: 7 [IU] via SUBCUTANEOUS

## 2022-03-07 MED ORDER — INSULIN ASPART 100 UNIT/ML IJ SOLN
5.0000 [IU] | Freq: Three times a day (TID) | INTRAMUSCULAR | Status: DC
  Administered 2022-03-07 – 2022-03-08 (×3): 5 [IU] via SUBCUTANEOUS

## 2022-03-07 MED ORDER — POTASSIUM CHLORIDE CRYS ER 20 MEQ PO TBCR
40.0000 meq | EXTENDED_RELEASE_TABLET | Freq: Two times a day (BID) | ORAL | Status: AC
  Administered 2022-03-07 (×2): 40 meq via ORAL
  Filled 2022-03-07 (×2): qty 2

## 2022-03-07 MED ORDER — INSULIN GLARGINE-YFGN 100 UNIT/ML ~~LOC~~ SOLN
10.0000 [IU] | Freq: Every day | SUBCUTANEOUS | Status: DC
  Administered 2022-03-07: 10 [IU] via SUBCUTANEOUS
  Filled 2022-03-07: qty 0.1

## 2022-03-07 NOTE — Progress Notes (Signed)
PROGRESS NOTE  Dawn [name] [id number] DOB: October 06, 2000 DOA: 03/05/2022 PCP: [name], MD  HPI/Recap of past 24 hours: Dawn [name] is a 22 y.o. female with medical history significant of DM1, poorly controlled diabetes with frequent admissions for DKA type I due to suspected non-adherence to medication regimen.  Follows with endocrinology outpatient.  Workup in the ED revealed DKA type I.  03/07/2022: the patient was seen and examined at bedside.  He has no complaints.  She denies having abdominal pain or nausea.  Hypokalemic.  Insulin drip paused.  Potassium repleted intravenously.  Assessment/Plan: Principal Problem:   Diabetic ketoacidosis without coma associated with type 1 diabetes mellitus ([place])  Diabetic ketoacidosis without coma associated with type 1 diabetes mellitus ([place]) Pt with DKA once again, extensive history of admissions frequently for same.  Felt to be due to non-adhearance to insulin regimen on multiple prior notes.  Most recently just admitted for same 2 weeks ago. She says she is taking her insulin and surprised she's back in DKA Transitioning to subcu insulin on 03/07/2022. Hemoglobin A1c to 12.2 on 02/18/2022  Severe hypokalemia in the setting of insulin drip Serum potassium 2.4 Repleted intravenously and orally. Insulin drip paused due to hypokalemia. Repeated serum potassium 3.4. Obtain Magnesium level.  Medical noncompliance Will need reinforcement on diabetes education.   Code Status: Full code  Family Communication: None at bedside  Disposition Plan: Likely will discharge to home on 03/08/2022.   Consultants: None.  Procedures: None.  Antimicrobials: None.  DVT prophylaxis: Subcu Lovenox daily.  Status is: Inpatient The patient requires at least 2 midnights for further evaluation and treatment of present condition.    Objective: Vitals:   03/07/22 0728 03/07/22 0800 03/07/22 0900 03/07/22 0910  BP:  112/69    Pulse: (!) 102 92  92 91  Resp: '19 17 15 16  '$ Temp:  97.9 F (36.6 C)    TempSrc:  Oral    SpO2: 100% 100% 100% 100%  Weight:      Height:        Intake/Output Summary (Last 24 hours) at 03/07/2022 1017 Last data filed at 03/06/2022 1600 Gross per 24 hour  Intake 1003.82 ml  Output --  Net 1003.82 ml   Filed Weights   03/06/22 0221  Weight: 49.7 kg    Exam:  General: 22 y.o. year-old female well developed well nourished in no acute distress.  Alert and oriented x3. Cardiovascular: Regular rate and rhythm with no rubs or gallops.  No thyromegaly or JVD noted.   Respiratory: Clear to auscultation with no wheezes or rales. Good inspiratory effort. Abdomen: Soft nontender nondistended with normal bowel sounds x4 quadrants. Musculoskeletal: No lower extremity edema. 2/4 pulses in all 4 extremities. Skin: No ulcerative lesions noted or rashes, Psychiatry: Mood is appropriate for condition and setting   Data Reviewed: CBC: Recent Labs  Lab 03/05/22 2305  WBC 8.2  HGB 12.7  HCT 43.1  MCV 96.2  PLT [id number]   Basic Metabolic Panel: Recent Labs  Lab 03/06/22 0605 03/06/22 1014 03/06/22 1426 03/06/22 1840 03/07/22 0338  NA 142 141 143 138 136  K 4.3 3.7 3.5 3.4* 2.4*  CL 114* 114* 116* 111 107  CO2 8* 11* 15* 15* 19*  GLUCOSE 189* 173* 168* 183* 172*  BUN '19 15 14 11 8  '$ CREATININE 0.95 0.89 0.81 0.63 0.62  CALCIUM 9.2 8.8* 8.9 8.6* 8.3*   GFR: Estimated Creatinine Clearance: 84.6 mL/min (by C-G formula based on SCr of  0.62 mg/dL). Liver Function Tests: No results for input(s): "AST", "ALT", "ALKPHOS", "BILITOT", "PROT", "ALBUMIN" in the last 168 hours. No results for input(s): "LIPASE", "AMYLASE" in the last 168 hours. No results for input(s): "AMMONIA" in the last 168 hours. Coagulation Profile: No results for input(s): "INR", "PROTIME" in the last 168 hours. Cardiac Enzymes: No results for input(s): "CKTOTAL", "CKMB", "CKMBINDEX", "TROPONINI" in the last 168 hours. BNP (last 3  results) No results for input(s): "PROBNP" in the last 8760 hours. HbA1C: No results for input(s): "HGBA1C" in the last 72 hours. CBG: Recent Labs  Lab 03/07/22 0438 03/07/22 0528 03/07/22 0656 03/07/22 0805 03/07/22 0859  GLUCAP 179* 198* 135* 119* 147*   Lipid Profile: Recent Labs    03/07/22 0339  CHOL 136  HDL 27*  LDLCALC 72  TRIG 184*  CHOLHDL 5.0   Thyroid Function Tests: No results for input(s): "TSH", "T4TOTAL", "FREET4", "T3FREE", "THYROIDAB" in the last 72 hours. Anemia Panel: No results for input(s): "VITAMINB12", "FOLATE", "FERRITIN", "TIBC", "IRON", "RETICCTPCT" in the last 72 hours. Urine analysis:    Component Value Date/Time   COLORURINE COLORLESS (A) 03/06/2022 0154   APPEARANCEUR CLEAR 03/06/2022 0154   APPEARANCEUR Clear 06/16/2021 1517   LABSPEC 1.018 03/06/2022 0154   PHURINE 5.0 03/06/2022 0154   GLUCOSEU >=500 (A) 03/06/2022 0154   HGBUR NEGATIVE 03/06/2022 0154   BILIRUBINUR NEGATIVE 03/06/2022 0154   BILIRUBINUR negative 10/14/2021 0942   BILIRUBINUR Negative 06/16/2021 1517   KETONESUR 80 (A) 03/06/2022 0154   PROTEINUR NEGATIVE 03/06/2022 0154   UROBILINOGEN 0.2 10/14/2021 0942   UROBILINOGEN 0.2 08/26/2019 1827   NITRITE NEGATIVE 03/06/2022 0154   LEUKOCYTESUR NEGATIVE 03/06/2022 0154   Sepsis Labs: '@LABRCNTIP'$ (procalcitonin:4,lacticidven:4)  ) Recent Results (from the past 240 hour(s))  MRSA Next Gen by PCR, Nasal     Status: None   Collection Time: 03/06/22  2:32 AM   Specimen: Nasal Mucosa; Nasal Swab  Result Value Ref Range Status   MRSA by PCR Next Gen NOT DETECTED NOT DETECTED Final    Comment: (NOTE) The GeneXpert MRSA Assay (FDA approved for NASAL specimens only), is one component of a comprehensive MRSA colonization surveillance program. It is not intended to diagnose MRSA infection nor to guide or monitor treatment for MRSA infections. Test performance is not FDA approved in patients less than 73  years old. Performed at [facility], [place] [address] [place], [place] [address]       Studies: No results found.  Scheduled Meds:  Chlorhexidine Gluconate Cloth  6 each Topical Daily   enoxaparin (LOVENOX) injection  40 mg Subcutaneous Q24H   potassium chloride  40 mEq Oral BID    Continuous Infusions:  dextrose 5% lactated ringers 125 mL/hr at 03/06/22 2124   insulin Stopped (03/07/22 0948)   lactated ringers Stopped (03/06/22 0545)   potassium chloride 10 mEq (03/07/22 0948)     LOS: 1 day     Kayleen Memos, MD [facility] Pager [phone]  If 7PM-7AM, please contact night-coverage [email or web address] Password [facility] 03/07/2022, 10:17 AM

## 2022-03-07 NOTE — Inpatient Diabetes Management (Signed)
Inpatient Diabetes Program Recommendations  AACE/ADA: New Consensus Statement on Inpatient Glycemic Control (2015)  Target Ranges:  Prepandial:   less than 140 mg/dL      Peak postprandial:   less than 180 mg/dL (1-2 hours)      Critically ill patients:  140 - 180 mg/dL   Lab Results  Component Value Date   GLUCAP 147 (H) 03/07/2022   HGBA1C 12.2 (H) 02/18/2022    Review of Glycemic Control  Latest Reference Range & Units 03/07/22 03:38  Sodium 135 - 145 mmol/L 136  Potassium 3.5 - 5.1 mmol/L 2.4 (LL)  Chloride 98 - 111 mmol/L 107  CO2 22 - 32 mmol/L 19 (L)  Glucose 70 - 99 mg/dL 172 (H)  (LL): Data is critically low  Diabetes history: DM 1 (requires basal bolus insulin to correct glucose levels and cover carbohydrates consumed) Outpatient Diabetes medications: [name] 40 units Daily, Humalog 0-8 units tid Current orders for Inpatient glycemic control:  IV insulin/DKA/Endotool   Note: pt is well known to our team. Was just here with DKA and I gave patient a dexcom CGM for glucose trends for home use, Pt had Endocrinology visit on 3/1. Has been admitted 6 times in the last 7 months with DKA. Per Endocrine notes pt has barriers such as depression and transportation issues. Unsure of reasons this admission as pt just had an appointment the day prior.   A1c 12.2% on 2/16   Inpatient Diabetes Program Recommendations:    Noted AM potassium level < 3.0. Would recommend holding IV insulin until potassium increases.     At time of transition consider: -  Semglee 30 units -  Novolog 0-9 units tid + hs scale -  Novolog 3 units tid meal coverage if eating >50% of meals  Thanks, Bronson Curb, MSN, RNC-OB Diabetes Coordinator [phone] (8a-5p)

## 2022-03-07 NOTE — Progress Notes (Signed)
  Transition of Care [facility]) Screening Note   Patient Details  Name: Dawn [name] Date of Birth: 2000-08-29   Transition of [facility]) CM/SW Contact:    Vassie Moselle, LCSW Phone Number: 03/07/2022, 10:23 AM    Transition of Care Department [facility]) has reviewed patient and no TOC needs have been identified at this time. We will continue to monitor patient advancement through interdisciplinary progression rounds. If new patient transition needs arise, please place a TOC consult.

## 2022-03-08 LAB — BASIC METABOLIC PANEL
Anion gap: 7 (ref 5–15)
BUN: 7 mg/dL (ref 6–20)
CO2: 22 mmol/L (ref 22–32)
Calcium: 8.6 mg/dL — ABNORMAL LOW (ref 8.9–10.3)
Chloride: 104 mmol/L (ref 98–111)
Creatinine, Ser: 0.5 mg/dL (ref 0.44–1.00)
GFR, Estimated: >60 mL/min (ref >60)
Glucose, Bld: 345 mg/dL — ABNORMAL HIGH (ref 70–99)
Potassium: 3.9 mmol/L (ref 3.5–5.1)
Sodium: 133 mmol/L — ABNORMAL LOW (ref 135–145)

## 2022-03-08 LAB — GLUCOSE, CAPILLARY
Glucose-Capillary: 350 mg/dL — ABNORMAL HIGH (ref 70–99)
Glucose-Capillary: 221 mg/dL — ABNORMAL HIGH (ref 70–99)

## 2022-03-08 MED ORDER — INSULIN ASPART 100 UNIT/ML IJ SOLN: 8.0000 [IU] | Freq: Three times a day (TID) | INTRAMUSCULAR | Status: DC

## 2022-03-08 MED ORDER — INSULIN GLARGINE 100 UNIT/ML SOLOSTAR PEN: 25.0000 [IU] | PEN_INJECTOR | Freq: Every day | SUBCUTANEOUS | 0 refills | Status: DC

## 2022-03-08 MED ORDER — SERTRALINE HCL 50 MG PO TABS
75.0000 mg | ORAL_TABLET | Freq: Every day | ORAL | Status: DC
  Administered 2022-03-08: 75 mg via ORAL
  Filled 2022-03-08: qty 2

## 2022-03-08 MED ORDER — INSULIN GLARGINE-YFGN 100 UNIT/ML ~~LOC~~ SOLN: 25.0000 [IU] | Freq: Every day | SUBCUTANEOUS | Status: DC

## 2022-03-08 MED ORDER — NAPHAZOLINE-PHENIRAMINE 0.025-0.3 % OP SOLN: 2.0000 [drp] | Freq: Four times a day (QID) | OPHTHALMIC | 0 refills | Status: DC | PRN

## 2022-03-08 MED ORDER — TRAMADOL HCL 50 MG PO TABS
25.0000 mg | ORAL_TABLET | Freq: Once | ORAL | Status: AC
  Administered 2022-03-08: 25 mg via ORAL
  Filled 2022-03-08: qty 1

## 2022-03-08 MED ORDER — FIASP FLEXTOUCH 100 UNIT/ML ~~LOC~~ SOPN: 10.0000 [IU] | PEN_INJECTOR | Freq: Three times a day (TID) | SUBCUTANEOUS | 0 refills | Status: DC

## 2022-03-08 MED ORDER — INSULIN GLARGINE-YFGN 100 UNIT/ML ~~LOC~~ SOLN
5.0000 [IU] | SUBCUTANEOUS | Status: DC
  Filled 2022-03-08: qty 0.05

## 2022-03-08 NOTE — Progress Notes (Signed)
Discharge instructions verbally given to patient without any questions/concerns at this time. Patient informed this nurse that her Endocrinologist's office will call her back for a follow up appointment. IV removed. All belongings accounted for by patient; patient transported via wheelchair downstairs to awaiting parent without complication.

## 2022-03-08 NOTE — Discharge Summary (Signed)
Discharge Summary  Dawn [name] [id number] DOB: 06-Mar-2000  PCP: [name], MD  Admit date: 03/05/2022 Discharge date: 03/08/2022  Time spent: 35 minutes.  Recommendations for Outpatient Follow-up:  Follow-up with your primary care provider. Follow-up with your endocrinologist.  Discharge Diagnoses:  Active Hospital Problems   Diagnosis Date Noted   Diabetic ketoacidosis without coma associated with type 1 diabetes mellitus ([place]) 04/10/2021    Priority: 1.    Resolved Hospital Problems  No resolved problems to display.    Discharge Condition: Stable  Diet recommendation: Continue carb modified diet    Vitals:   03/08/22 0942 03/08/22 1300  BP: 105/68 111/72  Pulse: (!) 107 (!) 101  Resp: 20 14  Temp:    SpO2: 97% 99%    History of present illness:  Dawn [name] is a 22 y.o. female with medical history significant of DM1, poorly controlled diabetes with frequent admissions for DKA type I due to suspected non-adherence to medication regimen.  Follows with endocrinology outpatient.  Workup in the ED revealed DKA type I.  Completed DKA protocol.  Acidosis has resolved.  Tolerating a diet well.  Seen by diabetes coordinator.  Per the patient there is plan for insulin pump set up outpatient.  On 03/07/2022 complaining of eye irritation.  Visine eyedrops prescribed for eye irritation, improvement noted on exam.   03/08/2022: The patient was seen and examined at bedside.  There were no acute events overnight.  She has no new complaints.  No reported nausea or abdominal pain.  She is tolerating a diet well.  Hospital Course:  Principal Problem:   Diabetic ketoacidosis without coma associated with type 1 diabetes mellitus ([place])  Resolved diabetic ketoacidosis without coma associated with type 1 diabetes mellitus ([place]) The patient presented with DKA, extensive history of admissions frequently for same.  Felt to be due to non-adhearance to insulin regimen on multiple prior  notes.  Most recently just admitted for the same 2 weeks ago. Hemoglobin A1c to 12.2 on 02/18/2022   Resolved severe hypokalemia in the setting of insulin drip Serum potassium 2.4> 3.9   Medical noncompliance Will need reinforcement on diabetes education. Seen by diabetes coordinator.  Dry eyes and irritation Visine eyedrops as needed Follow-up with your PCP and ophthalmology if condition persists     Code Status: Full code      Consultants: None.   Procedures: None.   Antimicrobials: None.    Discharge Exam: BP 111/72   Pulse (!) 101   Temp 98.1 F (36.7 C) (Oral)   Resp 14   Ht '5\' 1"'$  (1.549 m)   Wt 49.7 kg   LMP 02/16/2022 (Approximate)   SpO2 99%   BMI 20.70 kg/m  General: 22 y.o. year-old female well developed well nourished in no acute distress.  Alert and oriented x3. Cardiovascular: Regular rate and rhythm with no rubs or gallops.  No thyromegaly or JVD noted.   Respiratory: Clear to auscultation with no wheezes or rales. Good inspiratory effort. Abdomen: Soft nontender nondistended with normal bowel sounds x4 quadrants. Musculoskeletal: No lower extremity edema. 2/4 pulses in all 4 extremities. Skin: No ulcerative lesions noted or rashes, Psychiatry: Mood is appropriate for condition and setting  Discharge Instructions You were cared for by a hospitalist during your hospital stay. If you have any questions about your discharge medications or the care you received while you were in the hospital after you are discharged, you can call the unit and asked to speak with the hospitalist on  call if the hospitalist that took care of you is not available. Once you are discharged, your primary care physician will handle any further medical issues. Please note that NO REFILLS for any discharge medications will be authorized once you are discharged, as it is imperative that you return to your primary care physician (or establish a relationship with a primary care physician  if you do not have one) for your aftercare needs so that they can reassess your need for medications and monitor your lab values.   Allergies as of 03/08/2022       Reactions   Latex Itching, Swelling, Other (See Comments)   Causes skin irritation   Tape Other (See Comments)   Causes skin irritation   Shellfish Allergy Itching, Rash, Other (See Comments)   Reaction to scallops        Medication List     TAKE these medications    Accu-Chek Guide test strip Generic drug: glucose blood Use to check glucose 6 times daily   Accu-Chek Guide test strip Generic drug: glucose blood Use up to four times daily as directed   Accu-Chek Guide w/Device Kit Use 3 (three) times daily before meals.   Accu-Chek Softclix Lancets lancets Please use to check blood sugar up to 6 times/day.   Accu-Chek Softclix Lancets lancets Use up to four times daily   famotidine 20 MG tablet Commonly known as: PEPCID Take 1 tablet (20 mg total) by mouth 2 (two) times daily.   Fiasp FlexTouch 100 UNIT/ML FlexTouch Pen Generic drug: insulin aspart Inject 10 Units into the skin in the morning, at noon, and at bedtime. Up to 3 times a day with meals What changed: when to take this   HumaLOG KwikPen 100 UNIT/ML KwikPen Generic drug: insulin lispro Inject 5-15 Units into the skin 3 times daily with meals- per sliding scale based on carb count. (See admin instructions.)   insulin glargine 100 UNIT/ML Solostar Pen Commonly known as: LANTUS Inject 25 Units into the skin daily.   naphazoline-pheniramine 0.025-0.3 % ophthalmic solution Commonly known as: NAPHCON-A Place 2 drops into both eyes 4 (four) times daily as needed for eye irritation.   Pentips 32G X 4 MM Misc Generic drug: Insulin Pen Needle USE TO INJECT INSULIN 6 TIMES DAILY   polyethylene glycol powder 17 GM/SCOOP powder Commonly known as: GLYCOLAX/MIRALAX Take 1 capful (17 g) by mouth daily.   sertraline 25 MG tablet Commonly known as:  ZOLOFT Take 3 tablets (75 mg total) by mouth daily.   SUMAtriptan 25 MG tablet Commonly known as: Imitrex Take 0.5 tablets (12.5 mg total) by mouth every 2 (two) hours as needed for migraine. May repeat in 2 hours if headache persists or recurs.   tizanidine 2 MG capsule Commonly known as: ZANAFLEX Take 2 mg by mouth 3 (three) times daily as needed for muscle spasms.   [name] FlexTouch 100 UNIT/ML FlexTouch Pen Generic drug: insulin degludec Inject 40 Units into the skin daily.       Allergies  Allergen Reactions   Latex Itching, Swelling and Other (See Comments)    Causes skin irritation   Tape Other (See Comments)    Causes skin irritation   Shellfish Allergy Itching, Rash and Other (See Comments)    Reaction to scallops    Follow-up Information     [name], MD. Call today.   Specialty: Internal Medicine Why: Please call for a posthospital follow-up appointment. Contact information: [place] [address] [phone]  The results of significant diagnostics from this hospitalization (including imaging, microbiology, ancillary and laboratory) are listed below for reference.    Significant Diagnostic Studies: DG Chest 2 View  Result Date: 03/05/2022 CLINICAL DATA:  Shortness of breath EXAM: CHEST - 2 VIEW COMPARISON:  10/17/2021 FINDINGS: The heart size and mediastinal contours are within normal limits. Both lungs are clear. The visualized skeletal structures are unremarkable. IMPRESSION: Normal study. Electronically Signed   By: Rolm Baptise M.D.   On: 03/05/2022 21:54    Microbiology: Recent Results (from the past 240 hour(s))  MRSA Next Gen by PCR, Nasal     Status: None   Collection Time: 03/06/22  2:32 AM   Specimen: Nasal Mucosa; Nasal Swab  Result Value Ref Range Status   MRSA by PCR Next Gen NOT DETECTED NOT DETECTED Final    Comment: (NOTE) The GeneXpert MRSA Assay (FDA approved for NASAL specimens only), is  one component of a comprehensive MRSA colonization surveillance program. It is not intended to diagnose MRSA infection nor to guide or monitor treatment for MRSA infections. Test performance is not FDA approved in patients less than 6 years old. Performed at [facility], [place] [address] [place], [place] [address]      Labs: Basic Metabolic Panel: Recent Labs  Lab 03/06/22 1840 03/07/22 0338 03/07/22 1029 03/07/22 1921 03/08/22 0612  NA 138 136 139 136 133*  K 3.4* 2.4* 3.4* 3.9 3.9  CL 111 107 110 108 104  CO2 15* 19* 21* 19* 22  GLUCOSE 183* 172* 197* 271* 345*  BUN '11 8 6 9 7  '$ CREATININE 0.63 0.62 0.50 0.62 0.50  CALCIUM 8.6* 8.3* 8.4* 9.2 8.6*   Liver Function Tests: No results for input(s): "AST", "ALT", "ALKPHOS", "BILITOT", "PROT", "ALBUMIN" in the last 168 hours. No results for input(s): "LIPASE", "AMYLASE" in the last 168 hours. No results for input(s): "AMMONIA" in the last 168 hours. CBC: Recent Labs  Lab 03/05/22 2305  WBC 8.2  HGB 12.7  HCT 43.1  MCV 96.2  PLT 264   Cardiac Enzymes: No results for input(s): "CKTOTAL", "CKMB", "CKMBINDEX", "TROPONINI" in the last 168 hours. BNP: BNP (last 3 results) No results for input(s): "BNP" in the last 8760 hours.  ProBNP (last 3 results) No results for input(s): "PROBNP" in the last 8760 hours.  CBG: Recent Labs  Lab 03/07/22 1622 03/07/22 1624 03/07/22 2108 03/08/22 0807 03/08/22 1211  GLUCAP 444* 425* 182* 350* 221*       Signed:  Kayleen Memos, MD [facility] 03/08/2022, 1:17 PM

## 2022-03-08 NOTE — Inpatient Diabetes Management (Signed)
Inpatient Diabetes Program Recommendations  AACE/ADA: New Consensus Statement on Inpatient Glycemic Control (2015)  Target Ranges:  Prepandial:   less than 140 mg/dL      Peak postprandial:   less than 180 mg/dL (1-2 hours)      Critically ill patients:  140 - 180 mg/dL   Lab Results  Component Value Date   GLUCAP 350 (H) 03/08/2022   HGBA1C 12.2 (H) 02/18/2022    Review of Glycemic Control  Latest Reference Range & Units 03/07/22 16:22 03/07/22 16:24 03/07/22 21:08 03/08/22 08:07  Glucose-Capillary 70 - 99 mg/dL 444 (H) 425 (H) 182 (H) 350 (H)  (H): Data is abnormally high  Diabetes history: DM 1 (requires basal bolus insulin to correct glucose levels and cover carbohydrates consumed) Outpatient Diabetes medications: [name] 40 units Daily, Humalog 0-8 units tid Current orders for Inpatient glycemic control:  Semglee 20 units QD, Novolog 5 units TID, Novolog 0-9 units TID & HS  Inpatient Diabetes Program Recommendations:    Noted order changes this AM and associated hyperglycemia related to pausing IV insulin due to hypokalemia.   Consider further increasing Semglee to 25 units QD and increasing Novolog 8 units TID (assuming patient is consuming >50% of meals)   Thanks, Bronson Curb, MSN, RNC-OB Diabetes Coordinator [phone] (8a-5p)

## 2022-03-24 ENCOUNTER — Other Ambulatory Visit: Payer: BLUE CROSS/BLUE SHIELD

## 2022-03-25 ENCOUNTER — Encounter (HOSPITAL_COMMUNITY): Payer: Self-pay | Admitting: Emergency Medicine

## 2022-03-25 ENCOUNTER — Other Ambulatory Visit: Payer: Self-pay

## 2022-03-25 ENCOUNTER — Emergency Department (HOSPITAL_COMMUNITY)
Admission: EM | Admit: 2022-03-25 | Discharge: 2022-03-25 | Disposition: A | Discharge status: Home / Self Care | Payer: BLUE CROSS/BLUE SHIELD | Source: Home / Self Care | Attending: Emergency Medicine | Admitting: Emergency Medicine

## 2022-03-25 DIAGNOSIS — A4151 Sepsis due to Escherichia coli [E. coli]: Secondary | ICD-10-CM | POA: Diagnosis not present

## 2022-03-25 DIAGNOSIS — E109 Type 1 diabetes mellitus without complications: Secondary | ICD-10-CM | POA: Insufficient documentation

## 2022-03-25 DIAGNOSIS — Z794 Long term (current) use of insulin: Secondary | ICD-10-CM | POA: Insufficient documentation

## 2022-03-25 DIAGNOSIS — Z91040 Latex allergy status: Secondary | ICD-10-CM | POA: Insufficient documentation

## 2022-03-25 DIAGNOSIS — N390 Urinary tract infection, site not specified: Secondary | ICD-10-CM | POA: Diagnosis not present

## 2022-03-25 DIAGNOSIS — R55 Syncope and collapse: Secondary | ICD-10-CM | POA: Insufficient documentation

## 2022-03-25 LAB — BASIC METABOLIC PANEL
Anion gap: 8 (ref 5–15)
BUN: 5 mg/dL — ABNORMAL LOW (ref 6–20)
CO2: 19 mmol/L — ABNORMAL LOW (ref 22–32)
Calcium: 8.2 mg/dL — ABNORMAL LOW (ref 8.9–10.3)
Chloride: 106 mmol/L (ref 98–111)
Creatinine, Ser: 0.58 mg/dL (ref 0.44–1.00)
GFR, Estimated: >60 mL/min (ref >60)
Glucose, Bld: 281 mg/dL — ABNORMAL HIGH (ref 70–99)
Potassium: 3.7 mmol/L (ref 3.5–5.1)
Sodium: 133 mmol/L — ABNORMAL LOW (ref 135–145)

## 2022-03-25 LAB — I-STAT BETA HCG BLOOD, ED (MC, WL, AP ONLY): I-stat hCG, quantitative: <5 m[IU]/mL (ref <5)

## 2022-03-25 LAB — CBC
HCT: 27.5 % — ABNORMAL LOW (ref 36.0–46.0)
Hemoglobin: 9 g/dL — ABNORMAL LOW (ref 12.0–15.0)
MCH: 29 pg (ref 26.0–34.0)
MCHC: 32.7 g/dL (ref 30.0–36.0)
MCV: 88.7 fL (ref 80.0–100.0)
Platelets: 267 10*3/uL (ref 150–400)
RBC: 3.1 MIL/uL — ABNORMAL LOW (ref 3.87–5.11)
RDW: 13.8 % (ref 11.5–15.5)
WBC: 8.9 10*3/uL (ref 4.0–10.5)
nRBC: 0 % (ref 0.0–0.2)

## 2022-03-25 LAB — CBG MONITORING, ED: Glucose-Capillary: 286 mg/dL — ABNORMAL HIGH (ref 70–99)

## 2022-03-25 NOTE — ED Triage Notes (Signed)
Pt's visitors state that pt has had episodes of "passing out."  She was just released from the hospital today for DKA.  She is alert and oriented in triage but not forth coming w/ information. Pt states she started feeling dizzy but not as if she was "high or low."

## 2022-03-25 NOTE — Discharge Instructions (Addendum)
You came to the emergency department today after may be losing consciousness.  He said that you felt normal and did not want to stay for any of your lab work.  You requested to be discharged.  Please follow-up on your lab work online and I recommend continued follow-up with your PCP, especially given your recent hospitalization.  Please return with any recurring symptoms.  It was a pleasure to meet you and we hope you feel better.

## 2022-03-25 NOTE — ED Provider Notes (Signed)
[facility] Provider Note   CSN: [id number] Arrival date & time: 03/25/22  2033     History  Chief Complaint  Patient presents with   Loss of Consciousness    Dawn [name] is a 22 y.o. female with a past medical history of poorly controlled type 1 diabetes presenting today due to questionable loss of consciousness.  She presents with her friend to say that she was "passing out" in the backseat of the car after having juicy crab.  They said the first she was snoring but then she stopped snoring.  They said that when the EMS arrived she said "where I am, what is going on."  Patient tells me that she feels fine and would like to be discharged.   Loss of Consciousness      Home Medications Prior to Admission medications   Medication Sig Start Date End Date Taking? Authorizing Provider  Accu-Chek Softclix Lancets lancets Please use to check blood sugar up to 6 times/day. 08/05/21   August Albino, MD  Blood Glucose Monitoring Suppl (ACCU-CHEK GUIDE ME) w/Device KIT Use 3 (three) times daily before meals. 02/18/22   Holley Bouche, MD  famotidine (PEPCID) 20 MG tablet Take 1 tablet (20 mg total) by mouth 2 (two) times daily. 02/04/22   Audley Hose, MD  Fingerstix Lancets MISC Use up to four times daily 02/18/22   Lind Covert, MD  glucose blood (ACCU-CHEK GUIDE) test strip Use to check glucose 6 times daily 08/05/21   August Albino, MD  glucose blood test strip Use up to four times daily as directed 02/18/22   Holley Bouche, MD  insulin aspart (FIASP FLEXTOUCH) 100 UNIT/ML FlexTouch Pen Inject 10 Units into the skin in the morning, at noon, and at bedtime. Up to 3 times a day with meals 03/08/22   Kayleen Memos, DO  insulin glargine (LANTUS) 100 UNIT/ML Solostar Pen Inject 25 Units into the skin daily. 03/08/22   Kayleen Memos, DO  insulin lispro (HUMALOG KWIKPEN) 100 UNIT/ML KwikPen Inject 5-15 Units into the skin 3 times daily with  meals- per sliding scale based on carb count. (See admin instructions.) Patient not taking: Reported on 03/06/2022 08/05/21 03/06/22  August Albino, MD  Insulin Pen Needle (BD PEN NEEDLE NANO 2ND GEN) 32G X 4 MM MISC USE TO INJECT INSULIN 6 TIMES DAILY 08/05/21   August Albino, MD  naphazoline-pheniramine (NAPHCON-A) 0.025-0.3 % ophthalmic solution Place 2 drops into both eyes 4 (four) times daily as needed for eye irritation. 03/08/22   Kayleen Memos, DO  polyethylene glycol powder (GLYCOLAX/MIRALAX) 17 GM/SCOOP powder Take 1 capful (17 g) by mouth daily. 12/15/21   Wells Guiles, DO  sertraline (ZOLOFT) 25 MG tablet Take 3 tablets (75 mg total) by mouth daily. 09/03/21   Sharion Settler, DO  SUMAtriptan (IMITREX) 25 MG tablet Take 0.5 tablets (12.5 mg total) by mouth every 2 (two) hours as needed for migraine. May repeat in 2 hours if headache persists or recurs. 04/12/21   [name], MD  tizanidine (ZANAFLEX) 2 MG capsule Take 2 mg by mouth 3 (three) times daily as needed for muscle spasms.    [provider]  [name] FLEXTOUCH 100 UNIT/ML FlexTouch Pen Inject 40 Units into the skin daily. 11/09/21   [provider]      Allergies    Latex, Tape, and Shellfish allergy    Review of Systems   Review of Systems  Cardiovascular:  Positive for syncope.    Physical Exam Updated Vital Signs BP 101/70 (BP Location: Right Arm)   Pulse 85   Temp 97.9 F (36.6 C) (Oral)   Resp 18   Ht 5\' 1"  (1.549 m)   Wt 49.7 kg   LMP 02/16/2022 (Approximate)   SpO2 100%   BMI 20.70 kg/m  Physical Exam Vitals and nursing note reviewed.  Constitutional:      Appearance: Normal appearance.  HENT:     Head: Normocephalic and atraumatic.  Eyes:     General: No scleral icterus.    Conjunctiva/sclera: Conjunctivae normal.  Cardiovascular:     Rate and Rhythm: Normal rate and regular rhythm.  Pulmonary:     Effort: Pulmonary effort is normal. No respiratory distress.  Skin:    Findings:  No rash.  Neurological:     Mental Status: She is alert.     Comments: Alert and oriented.  Moving all extremities and ambulatory.  Very pleasant and energetic.  Laughing.  Psychiatric:        Mood and Affect: Mood normal.     ED Results / Procedures / Treatments   Labs (all labs ordered are listed, but only abnormal results are displayed) Labs Reviewed  CBC - Abnormal; Notable for the following components:      Result Value   RBC 3.10 (*)    Hemoglobin 9.0 (*)    HCT 27.5 (*)    All other components within normal limits  CBG MONITORING, ED - Abnormal; Notable for the following components:   Glucose-Capillary 286 (*)    All other components within normal limits  BASIC METABOLIC PANEL  URINALYSIS, ROUTINE W REFLEX MICROSCOPIC  CBG MONITORING, ED  I-STAT BETA HCG BLOOD, ED ([facility], [facility], [facility] ONLY)    EKG None  Radiology No results found.  Procedures Procedures   Medications Ordered in ED Medications - No data to display  ED Course/ Medical Decision Making/ A&P                             Medical Decision Making Amount and/or Complexity of Data Reviewed Labs: ordered.   22 year old female who presented today due to a questionable syncopal episode.  Differential includes but is not limited to hypoglycemia, arrhythmia, hypotension, substance use.  This is not an exhaustive differential.    Past Medical History / Co-morbidities / Social History: Poorly controlled type 1 diabetes   Additional history: Recently discharged from the hospital 2 weeks ago for DKA   Physical Exam: Pertinent physical exam findings include RRR, ambulatory, pleasant and very alert  Lab Tests: I ordered, and personally interpreted labs.  The pertinent results include: Hemoglobin 9, slightly decreased from 1 month ago. CBG 286 Not pregnant     Medications: Denies the need for medication   MDM/Disposition: This is a 22 year old female who presented today due to questionable loss of  consciousness.  She believes that she just fell asleep after eating a heavy dinner.  Friend stated that she was not very responsive and that is why they called EMS.  On my evaluation patient is sitting comfortably.  She is laughing and saying she is ready to go because she feels normal.  She was recently discharged for DKA.  Blood sugar 286 however does not appear to be in DKA at this time.  I encouraged her to stay for the remainder of her workup and to at least wait for her labs  but she says "I am ready to go I have my chart."  At this time patient is hemodynamically stable.  Normal vital signs.  Ambulatory with a nonischemic EKG.  Hemoglobin is 9 however apparently is asymptomatic of this.  Will be discharged per her request.   Final Clinical Impression(s) / ED Diagnoses Final diagnoses:  Syncope, unspecified syncope type    Rx / DC Orders ED Discharge Orders     None      Results and diagnoses were explained to the patient. Return precautions discussed in full. Patient had no additional questions and expressed complete understanding.   This chart was dictated using voice recognition software.  Despite best efforts to proofread,  errors can occur which can change the documentation meaning.    Darliss Ridgel 03/25/22 2202    Fransico Meadow, MD 03/26/22 [phone]

## 2022-03-27 ENCOUNTER — Other Ambulatory Visit: Payer: Self-pay

## 2022-03-27 ENCOUNTER — Encounter (HOSPITAL_COMMUNITY): Payer: Self-pay | Admitting: Emergency Medicine

## 2022-03-27 ENCOUNTER — Inpatient Hospital Stay (HOSPITAL_COMMUNITY)
Admission: EM | Admit: 2022-03-27 | Discharge: 2022-04-01 | DRG: 872 | Disposition: A | Discharge status: Home / Self Care | Payer: BLUE CROSS/BLUE SHIELD | Attending: Internal Medicine | Admitting: Internal Medicine

## 2022-03-27 DIAGNOSIS — Z794 Long term (current) use of insulin: Secondary | ICD-10-CM

## 2022-03-27 DIAGNOSIS — R531 Weakness: Principal | ICD-10-CM

## 2022-03-27 DIAGNOSIS — F320 Major depressive disorder, single episode, mild: Secondary | ICD-10-CM | POA: Diagnosis present

## 2022-03-27 DIAGNOSIS — J45909 Unspecified asthma, uncomplicated: Secondary | ICD-10-CM | POA: Diagnosis present

## 2022-03-27 DIAGNOSIS — Z8249 Family history of ischemic heart disease and other diseases of the circulatory system: Secondary | ICD-10-CM

## 2022-03-27 DIAGNOSIS — I959 Hypotension, unspecified: Secondary | ICD-10-CM | POA: Diagnosis present

## 2022-03-27 DIAGNOSIS — Z79899 Other long term (current) drug therapy: Secondary | ICD-10-CM

## 2022-03-27 DIAGNOSIS — A4151 Sepsis due to Escherichia coli [E. coli]: Principal | ICD-10-CM | POA: Diagnosis present

## 2022-03-27 DIAGNOSIS — G43011 Migraine without aura, intractable, with status migrainosus: Secondary | ICD-10-CM | POA: Diagnosis present

## 2022-03-27 DIAGNOSIS — Z825 Family history of asthma and other chronic lower respiratory diseases: Secondary | ICD-10-CM

## 2022-03-27 DIAGNOSIS — Z91048 Other nonmedicinal substance allergy status: Secondary | ICD-10-CM

## 2022-03-27 DIAGNOSIS — F909 Attention-deficit hyperactivity disorder, unspecified type: Secondary | ICD-10-CM | POA: Diagnosis present

## 2022-03-27 DIAGNOSIS — N12 Tubulo-interstitial nephritis, not specified as acute or chronic: Secondary | ICD-10-CM | POA: Diagnosis present

## 2022-03-27 DIAGNOSIS — Z91013 Allergy to seafood: Secondary | ICD-10-CM

## 2022-03-27 DIAGNOSIS — A419 Sepsis, unspecified organism: Secondary | ICD-10-CM | POA: Diagnosis present

## 2022-03-27 DIAGNOSIS — R55 Syncope and collapse: Secondary | ICD-10-CM | POA: Diagnosis present

## 2022-03-27 DIAGNOSIS — E876 Hypokalemia: Secondary | ICD-10-CM | POA: Diagnosis present

## 2022-03-27 DIAGNOSIS — R6520 Severe sepsis without septic shock: Secondary | ICD-10-CM | POA: Diagnosis present

## 2022-03-27 DIAGNOSIS — Z91040 Latex allergy status: Secondary | ICD-10-CM

## 2022-03-27 DIAGNOSIS — E1065 Type 1 diabetes mellitus with hyperglycemia: Secondary | ICD-10-CM | POA: Diagnosis present

## 2022-03-27 DIAGNOSIS — Z833 Family history of diabetes mellitus: Secondary | ICD-10-CM

## 2022-03-27 DIAGNOSIS — B9620 Unspecified Escherichia coli [E. coli] as the cause of diseases classified elsewhere: Secondary | ICD-10-CM | POA: Diagnosis present

## 2022-03-27 NOTE — ED Provider Triage Note (Signed)
Emergency Medicine Provider Triage Evaluation Note  Dawn [name] , a 22 y.o. female  was evaluated in triage.  Pt complains of abdominal pain and right flank pain which started earlier today.  In the ER with complaints of syncope several days ago.  She reports several bouts of emesis but is able to hold down fluids and food.  Reports right-sided flank pain.  Denies any fevers or chills.  Denies any dysuria or hematuria.  Review of Systems  Positive: As above Negative: As above  Physical Exam  BP 110/84 (BP Location: Right Arm)   Pulse (!) 121   Temp 98.2 F (36.8 C) (Oral)   Resp 18   Ht 5\' 1"  (1.549 m)   Wt 47.6 kg   LMP 02/16/2022 (Approximate)   SpO2 100%   BMI 19.84 kg/m  Gen:   Awake, no distress  Resp:  Normal effort  MSK:   Moves extremities without difficulty  Other:  Significant right-sided flank tenderness  Medical Decision Making  Medically screening exam initiated at 11:57 PM.  Appropriate orders placed.  Dawn [name] was informed that the remainder of the evaluation will be completed by another provider, this initial triage assessment does not replace that evaluation, and the importance of remaining in the ED until their evaluation is complete.     Garald Balding, PA-C 03/27/22 2359

## 2022-03-27 NOTE — ED Triage Notes (Signed)
Pt bib [facility] for abdominal pain since Thursday. Endorses n/v. Pt seen on Thursday for DKA and states abdominal pain has not subsided since being seen. BP 140/92, HR 120, Spo2 98%, CBG 96

## 2022-03-28 ENCOUNTER — Emergency Department (HOSPITAL_COMMUNITY): Payer: BLUE CROSS/BLUE SHIELD

## 2022-03-28 ENCOUNTER — Encounter (HOSPITAL_COMMUNITY): Payer: Self-pay | Admitting: Internal Medicine

## 2022-03-28 DIAGNOSIS — G43011 Migraine without aura, intractable, with status migrainosus: Secondary | ICD-10-CM | POA: Diagnosis present

## 2022-03-28 DIAGNOSIS — E876 Hypokalemia: Secondary | ICD-10-CM

## 2022-03-28 DIAGNOSIS — E1065 Type 1 diabetes mellitus with hyperglycemia: Secondary | ICD-10-CM | POA: Diagnosis not present

## 2022-03-28 DIAGNOSIS — F320 Major depressive disorder, single episode, mild: Secondary | ICD-10-CM | POA: Diagnosis present

## 2022-03-28 DIAGNOSIS — B9620 Unspecified Escherichia coli [E. coli] as the cause of diseases classified elsewhere: Secondary | ICD-10-CM | POA: Diagnosis present

## 2022-03-28 DIAGNOSIS — Z91013 Allergy to seafood: Secondary | ICD-10-CM | POA: Diagnosis not present

## 2022-03-28 DIAGNOSIS — R55 Syncope and collapse: Secondary | ICD-10-CM | POA: Diagnosis present

## 2022-03-28 DIAGNOSIS — Z8249 Family history of ischemic heart disease and other diseases of the circulatory system: Secondary | ICD-10-CM | POA: Diagnosis not present

## 2022-03-28 DIAGNOSIS — I959 Hypotension, unspecified: Secondary | ICD-10-CM | POA: Diagnosis not present

## 2022-03-28 DIAGNOSIS — A419 Sepsis, unspecified organism: Secondary | ICD-10-CM | POA: Diagnosis present

## 2022-03-28 DIAGNOSIS — N390 Urinary tract infection, site not specified: Secondary | ICD-10-CM | POA: Diagnosis present

## 2022-03-28 DIAGNOSIS — J45909 Unspecified asthma, uncomplicated: Secondary | ICD-10-CM | POA: Diagnosis present

## 2022-03-28 DIAGNOSIS — N12 Tubulo-interstitial nephritis, not specified as acute or chronic: Secondary | ICD-10-CM | POA: Diagnosis present

## 2022-03-28 DIAGNOSIS — Z79899 Other long term (current) drug therapy: Secondary | ICD-10-CM | POA: Diagnosis not present

## 2022-03-28 DIAGNOSIS — Z794 Long term (current) use of insulin: Secondary | ICD-10-CM | POA: Diagnosis not present

## 2022-03-28 DIAGNOSIS — F909 Attention-deficit hyperactivity disorder, unspecified type: Secondary | ICD-10-CM | POA: Diagnosis present

## 2022-03-28 DIAGNOSIS — Z91040 Latex allergy status: Secondary | ICD-10-CM | POA: Diagnosis not present

## 2022-03-28 DIAGNOSIS — Z825 Family history of asthma and other chronic lower respiratory diseases: Secondary | ICD-10-CM | POA: Diagnosis not present

## 2022-03-28 DIAGNOSIS — R6520 Severe sepsis without septic shock: Secondary | ICD-10-CM

## 2022-03-28 DIAGNOSIS — Z91048 Other nonmedicinal substance allergy status: Secondary | ICD-10-CM | POA: Diagnosis not present

## 2022-03-28 DIAGNOSIS — A4151 Sepsis due to Escherichia coli [E. coli]: Secondary | ICD-10-CM | POA: Diagnosis present

## 2022-03-28 DIAGNOSIS — Z833 Family history of diabetes mellitus: Secondary | ICD-10-CM | POA: Diagnosis not present

## 2022-03-28 LAB — BASIC METABOLIC PANEL
Anion gap: 12 (ref 5–15)
BUN: 7 mg/dL (ref 6–20)
CO2: 21 mmol/L — ABNORMAL LOW (ref 22–32)
Calcium: 8.4 mg/dL — ABNORMAL LOW (ref 8.9–10.3)
Chloride: 108 mmol/L (ref 98–111)
Creatinine, Ser: 0.79 mg/dL (ref 0.44–1.00)
GFR, Estimated: >60 mL/min (ref >60)
Glucose, Bld: 97 mg/dL (ref 70–99)
Potassium: 3.1 mmol/L — ABNORMAL LOW (ref 3.5–5.1)
Sodium: 141 mmol/L (ref 135–145)

## 2022-03-28 LAB — I-STAT VENOUS BLOOD GAS, ED
Acid-base deficit: 1 mmol/L (ref 0.0–2.0)
Bicarbonate: 20.9 mmol/L (ref 20.0–28.0)
Calcium, Ion: 0.98 mmol/L — ABNORMAL LOW (ref 1.15–1.40)
HCT: 27 % — ABNORMAL LOW (ref 36.0–46.0)
Hemoglobin: 9.2 g/dL — ABNORMAL LOW (ref 12.0–15.0)
O2 Saturation: 99 %
Potassium: 2.9 mmol/L — ABNORMAL LOW (ref 3.5–5.1)
Sodium: 145 mmol/L (ref 135–145)
TCO2: 22 mmol/L (ref 22–32)
pCO2, Ven: 26.4 mmHg — ABNORMAL LOW (ref 44–60)
pH, Ven: 7.507 — ABNORMAL HIGH (ref 7.25–7.43)
pO2, Ven: 132 mmHg — ABNORMAL HIGH (ref 32–45)

## 2022-03-28 LAB — COMPREHENSIVE METABOLIC PANEL
ALT: 15 U/L (ref 0–44)
AST: 19 U/L (ref 15–41)
Albumin: 3.2 g/dL — ABNORMAL LOW (ref 3.5–5.0)
Alkaline Phosphatase: 191 U/L — ABNORMAL HIGH (ref 38–126)
Anion gap: 19 — ABNORMAL HIGH (ref 5–15)
BUN: 6 mg/dL (ref 6–20)
CO2: 17 mmol/L — ABNORMAL LOW (ref 22–32)
Calcium: 9.4 mg/dL (ref 8.9–10.3)
Chloride: 104 mmol/L (ref 98–111)
Creatinine, Ser: 0.9 mg/dL (ref 0.44–1.00)
GFR, Estimated: >60 mL/min (ref >60)
Glucose, Bld: 100 mg/dL — ABNORMAL HIGH (ref 70–99)
Potassium: 2.7 mmol/L — CL (ref 3.5–5.1)
Sodium: 140 mmol/L (ref 135–145)
Total Bilirubin: 1.2 mg/dL (ref 0.3–1.2)
Total Protein: 8.1 g/dL (ref 6.5–8.1)

## 2022-03-28 LAB — I-STAT BETA HCG BLOOD, ED (MC, WL, AP ONLY): I-stat hCG, quantitative: <5 m[IU]/mL (ref <5)

## 2022-03-28 LAB — URINALYSIS, ROUTINE W REFLEX MICROSCOPIC
Bacteria, UA: NONE SEEN
Bilirubin Urine: NEGATIVE
Glucose, UA: NEGATIVE mg/dL
Ketones, ur: 20 mg/dL — AB
Nitrite: NEGATIVE
Protein, ur: >=300 mg/dL — AB
Specific Gravity, Urine: 1.012 (ref 1.005–1.030)
WBC, UA: >50 WBC/hpf (ref 0–5)
pH: 5 (ref 5.0–8.0)

## 2022-03-28 LAB — CBC
HCT: 38.1 % (ref 36.0–46.0)
Hemoglobin: 12.3 g/dL (ref 12.0–15.0)
MCH: 28.7 pg (ref 26.0–34.0)
MCHC: 32.3 g/dL (ref 30.0–36.0)
MCV: 89 fL (ref 80.0–100.0)
Platelets: 335 10*3/uL (ref 150–400)
RBC: 4.28 MIL/uL (ref 3.87–5.11)
RDW: 13.6 % (ref 11.5–15.5)
WBC: 17.1 10*3/uL — ABNORMAL HIGH (ref 4.0–10.5)
nRBC: 0 % (ref 0.0–0.2)

## 2022-03-28 LAB — GLUCOSE, CAPILLARY: Glucose-Capillary: 172 mg/dL — ABNORMAL HIGH (ref 70–99)

## 2022-03-28 LAB — LACTIC ACID, PLASMA
Lactic Acid, Venous: 2.8 mmol/L (ref 0.5–1.9)
Lactic Acid, Venous: 1.9 mmol/L (ref 0.5–1.9)

## 2022-03-28 LAB — CBG MONITORING, ED
Glucose-Capillary: 105 mg/dL — ABNORMAL HIGH (ref 70–99)
Glucose-Capillary: 142 mg/dL — ABNORMAL HIGH (ref 70–99)
Glucose-Capillary: 149 mg/dL — ABNORMAL HIGH (ref 70–99)

## 2022-03-28 LAB — BETA-HYDROXYBUTYRIC ACID: Beta-Hydroxybutyric Acid: 0.92 mmol/L — ABNORMAL HIGH (ref 0.05–0.27)

## 2022-03-28 LAB — LIPASE, BLOOD: Lipase: 19 U/L (ref 11–51)

## 2022-03-28 MED ORDER — ONDANSETRON HCL 4 MG PO TABS: 4.0000 mg | ORAL_TABLET | Freq: Four times a day (QID) | ORAL | Status: DC | PRN

## 2022-03-28 MED ORDER — KETOROLAC TROMETHAMINE 15 MG/ML IJ SOLN
15.0000 mg | Freq: Once | INTRAMUSCULAR | Status: AC
  Administered 2022-03-28: 15 mg via INTRAVENOUS

## 2022-03-28 MED ORDER — KETOROLAC TROMETHAMINE 15 MG/ML IJ SOLN
15.0000 mg | Freq: Four times a day (QID) | INTRAMUSCULAR | Status: DC | PRN
  Filled 2022-03-28: qty 1

## 2022-03-28 MED ORDER — LACTATED RINGERS IV SOLN: INTRAVENOUS | Status: DC

## 2022-03-28 MED ORDER — KETOROLAC TROMETHAMINE 30 MG/ML IJ SOLN
15.0000 mg | Freq: Once | INTRAMUSCULAR | Status: AC
  Administered 2022-03-28: 15 mg via INTRAVENOUS
  Filled 2022-03-28: qty 1

## 2022-03-28 MED ORDER — LACTATED RINGERS IV BOLUS
1000.0000 mL | Freq: Once | INTRAVENOUS | Status: AC
  Administered 2022-03-28: 1000 mL via INTRAVENOUS

## 2022-03-28 MED ORDER — INSULIN ASPART 100 UNIT/ML IJ SOLN
0.0000 [IU] | Freq: Three times a day (TID) | INTRAMUSCULAR | Status: DC
  Administered 2022-03-28: 2 [IU] via SUBCUTANEOUS
  Administered 2022-03-28: 5 [IU] via SUBCUTANEOUS
  Administered 2022-03-29 – 2022-03-30 (×2): 2 [IU] via SUBCUTANEOUS

## 2022-03-28 MED ORDER — MAGNESIUM SULFATE 2 GM/50ML IV SOLN
2.0000 g | Freq: Once | INTRAVENOUS | Status: AC
  Administered 2022-03-28: 2 g via INTRAVENOUS
  Filled 2022-03-28: qty 50

## 2022-03-28 MED ORDER — NALOXONE HCL 0.4 MG/ML IJ SOLN: 0.4000 mg | INTRAMUSCULAR | Status: DC | PRN

## 2022-03-28 MED ORDER — POTASSIUM CHLORIDE 10 MEQ/100ML IV SOLN
10.0000 meq | INTRAVENOUS | Status: DC
  Administered 2022-03-28: 10 meq via INTRAVENOUS
  Filled 2022-03-28: qty 100

## 2022-03-28 MED ORDER — NOREPINEPHRINE 4 MG/250ML-% IV SOLN
2.0000 ug/min | INTRAVENOUS | Status: DC
  Filled 2022-03-28: qty 250

## 2022-03-28 MED ORDER — DIPHENHYDRAMINE HCL 25 MG PO CAPS
25.0000 mg | ORAL_CAPSULE | Freq: Four times a day (QID) | ORAL | Status: DC | PRN
  Administered 2022-03-28: 25 mg via ORAL
  Filled 2022-03-28: qty 1

## 2022-03-28 MED ORDER — LACTATED RINGERS IV BOLUS (SEPSIS)
2000.0000 mL | Freq: Once | INTRAVENOUS | Status: AC
  Administered 2022-03-28: 2000 mL via INTRAVENOUS

## 2022-03-28 MED ORDER — ENOXAPARIN SODIUM 40 MG/0.4ML IJ SOSY
40.0000 mg | PREFILLED_SYRINGE | INTRAMUSCULAR | Status: DC
  Filled 2022-03-28 (×3): qty 0.4

## 2022-03-28 MED ORDER — ACETAMINOPHEN 325 MG PO TABS
650.0000 mg | ORAL_TABLET | Freq: Four times a day (QID) | ORAL | Status: DC | PRN
  Administered 2022-03-28 – 2022-03-30 (×2): 650 mg via ORAL
  Filled 2022-03-28 (×2): qty 2

## 2022-03-28 MED ORDER — POTASSIUM CHLORIDE 10 MEQ/100ML IV SOLN
10.0000 meq | INTRAVENOUS | Status: AC
  Administered 2022-03-28 (×2): 10 meq via INTRAVENOUS
  Filled 2022-03-28 (×2): qty 100

## 2022-03-28 MED ORDER — HYDROCODONE-ACETAMINOPHEN 5-325 MG PO TABS
1.0000 | ORAL_TABLET | Freq: Four times a day (QID) | ORAL | Status: DC | PRN
  Administered 2022-03-28 – 2022-03-31 (×4): 1 via ORAL
  Filled 2022-03-28 (×4): qty 1

## 2022-03-28 MED ORDER — INSULIN GLARGINE-YFGN 100 UNIT/ML ~~LOC~~ SOLN
35.0000 [IU] | Freq: Every day | SUBCUTANEOUS | Status: DC
  Administered 2022-03-28: 35 [IU] via SUBCUTANEOUS
  Filled 2022-03-28 (×2): qty 0.35

## 2022-03-28 MED ORDER — HYDROMORPHONE HCL 1 MG/ML IJ SOLN
0.5000 mg | INTRAMUSCULAR | Status: DC | PRN
  Administered 2022-03-28: 0.5 mg via INTRAVENOUS
  Filled 2022-03-28: qty 0.5

## 2022-03-28 MED ORDER — SODIUM CHLORIDE 0.9 % IV SOLN
2.0000 g | INTRAVENOUS | Status: DC
  Administered 2022-03-29 – 2022-04-01 (×4): 2 g via INTRAVENOUS
  Filled 2022-03-28 (×4): qty 20

## 2022-03-28 MED ORDER — POTASSIUM CHLORIDE CRYS ER 20 MEQ PO TBCR
40.0000 meq | EXTENDED_RELEASE_TABLET | Freq: Once | ORAL | Status: AC
  Administered 2022-03-28: 40 meq via ORAL
  Filled 2022-03-28: qty 2

## 2022-03-28 MED ORDER — SODIUM CHLORIDE 0.9 % IV SOLN
2.0000 g | Freq: Once | INTRAVENOUS | Status: AC
  Administered 2022-03-28: 2 g via INTRAVENOUS
  Filled 2022-03-28: qty 20

## 2022-03-28 MED ORDER — SODIUM CHLORIDE 0.9% FLUSH
3.0000 mL | Freq: Two times a day (BID) | INTRAVENOUS | Status: DC
  Administered 2022-03-28 – 2022-04-01 (×8): 3 mL via INTRAVENOUS

## 2022-03-28 MED ORDER — SODIUM CHLORIDE 0.9 % IV SOLN
250.0000 mL | INTRAVENOUS | Status: DC
  Administered 2022-03-28: 250 mL via INTRAVENOUS

## 2022-03-28 MED ORDER — PANTOPRAZOLE SODIUM 40 MG IV SOLR
40.0000 mg | INTRAVENOUS | Status: DC
  Administered 2022-03-28 – 2022-03-31 (×4): 40 mg via INTRAVENOUS
  Filled 2022-03-28 (×4): qty 10

## 2022-03-28 MED ORDER — INSULIN ASPART 100 UNIT/ML IJ SOLN: 0.0000 [IU] | Freq: Every day | INTRAMUSCULAR | Status: DC

## 2022-03-28 MED ORDER — POTASSIUM CHLORIDE CRYS ER 20 MEQ PO TBCR
40.0000 meq | EXTENDED_RELEASE_TABLET | ORAL | Status: AC
  Administered 2022-03-28: 40 meq via ORAL
  Filled 2022-03-28: qty 2

## 2022-03-28 MED ORDER — SODIUM CHLORIDE 0.9 % IV SOLN: 1.0000 g | INTRAVENOUS | Status: DC

## 2022-03-28 MED ORDER — ONDANSETRON HCL 4 MG/2ML IJ SOLN
4.0000 mg | Freq: Four times a day (QID) | INTRAMUSCULAR | Status: DC | PRN
  Administered 2022-03-28: 4 mg via INTRAVENOUS
  Filled 2022-03-28: qty 2

## 2022-03-28 MED ORDER — HYDROMORPHONE HCL 1 MG/ML IJ SOLN
1.0000 mg | INTRAMUSCULAR | Status: DC | PRN
  Administered 2022-03-28: 1 mg via INTRAVENOUS
  Filled 2022-03-28: qty 1

## 2022-03-28 MED ORDER — ALBUTEROL SULFATE (2.5 MG/3ML) 0.083% IN NEBU: 2.5000 mg | INHALATION_SOLUTION | Freq: Four times a day (QID) | RESPIRATORY_TRACT | Status: DC | PRN

## 2022-03-28 MED ORDER — ACETAMINOPHEN 650 MG RE SUPP: 650.0000 mg | Freq: Four times a day (QID) | RECTAL | Status: DC | PRN

## 2022-03-28 NOTE — ED Provider Notes (Signed)
[facility] Provider Note   CSN: [id number] Arrival date & time: 03/27/22  2336     History  Chief Complaint  Patient presents with   Abdominal Pain    Dawn [name] is a 22 y.o. female.  HPI 22 year old female with a history of DM type I, poorly controlled, MDD, frequent admissions for DKA presents to the ER with complaints of nausea, vomiting, right flank pain which began earlier today.  She was discharged on 3/5 for DKA.  She reports the right-sided flank pain is new.  Does report some dysuria, no known hematuria.  Has had several episodes of emesis but is able to hold down food and water.  Denies any fevers.    Home Medications Prior to Admission medications   Medication Sig Start Date End Date Taking? Authorizing Provider  Accu-Chek Softclix Lancets lancets Please use to check blood sugar up to 6 times/day. 08/05/21   August Albino, MD  Blood Glucose Monitoring Suppl (ACCU-CHEK GUIDE ME) w/Device KIT Use 3 (three) times daily before meals. 02/18/22   Holley Bouche, MD  famotidine (PEPCID) 20 MG tablet Take 1 tablet (20 mg total) by mouth 2 (two) times daily. 02/04/22   Audley Hose, MD  Fingerstix Lancets MISC Use up to four times daily 02/18/22   Lind Covert, MD  glucose blood (ACCU-CHEK GUIDE) test strip Use to check glucose 6 times daily 08/05/21   August Albino, MD  glucose blood test strip Use up to four times daily as directed 02/18/22   Holley Bouche, MD  insulin aspart (FIASP FLEXTOUCH) 100 UNIT/ML FlexTouch Pen Inject 10 Units into the skin in the morning, at noon, and at bedtime. Up to 3 times a day with meals 03/08/22   Kayleen Memos, DO  insulin glargine (LANTUS) 100 UNIT/ML Solostar Pen Inject 25 Units into the skin daily. 03/08/22   Kayleen Memos, DO  insulin lispro (HUMALOG KWIKPEN) 100 UNIT/ML KwikPen Inject 5-15 Units into the skin 3 times daily with meals- per sliding scale based on carb count. (See admin  instructions.) Patient not taking: Reported on 03/06/2022 08/05/21 03/06/22  August Albino, MD  Insulin Pen Needle (BD PEN NEEDLE NANO 2ND GEN) 32G X 4 MM MISC USE TO INJECT INSULIN 6 TIMES DAILY 08/05/21   August Albino, MD  naphazoline-pheniramine (NAPHCON-A) 0.025-0.3 % ophthalmic solution Place 2 drops into both eyes 4 (four) times daily as needed for eye irritation. 03/08/22   Kayleen Memos, DO  polyethylene glycol powder (GLYCOLAX/MIRALAX) 17 GM/SCOOP powder Take 1 capful (17 g) by mouth daily. 12/15/21   Wells Guiles, DO  sertraline (ZOLOFT) 25 MG tablet Take 3 tablets (75 mg total) by mouth daily. 09/03/21   Sharion Settler, DO  SUMAtriptan (IMITREX) 25 MG tablet Take 0.5 tablets (12.5 mg total) by mouth every 2 (two) hours as needed for migraine. May repeat in 2 hours if headache persists or recurs. 04/12/21   [name], MD  tizanidine (ZANAFLEX) 2 MG capsule Take 2 mg by mouth 3 (three) times daily as needed for muscle spasms.    [provider]  [name] FLEXTOUCH 100 UNIT/ML FlexTouch Pen Inject 40 Units into the skin daily. 11/09/21   [provider]      Allergies    Latex, Tape, and Shellfish allergy    Review of Systems   Review of Systems Ten systems reviewed and are negative for acute change, except as noted in the HPI.   Physical  Exam Updated Vital Signs BP (!) 77/41 (BP Location: Right Arm)   Pulse 86   Temp 98.4 F (36.9 C) (Oral)   Resp 18   Ht 5\' 1"  (1.549 m)   Wt 47.6 kg   LMP 02/16/2022 (Approximate)   SpO2 97%   BMI 19.84 kg/m  Physical Exam Vitals and nursing note reviewed.  Constitutional:      General: She is not in acute distress.    Appearance: She is well-developed.     Comments: Uncomfortable appearing  HENT:     Head: Normocephalic and atraumatic.  Eyes:     Conjunctiva/sclera: Conjunctivae normal.  Cardiovascular:     Rate and Rhythm: Normal rate and regular rhythm.     Heart sounds: No murmur heard. Pulmonary:     Effort:  Pulmonary effort is normal. No respiratory distress.     Breath sounds: Normal breath sounds.  Abdominal:     Palpations: Abdomen is soft.     Tenderness: There is no abdominal tenderness. There is right CVA tenderness.  Musculoskeletal:        General: No swelling.     Cervical back: Neck supple.  Skin:    General: Skin is warm and dry.     Capillary Refill: Capillary refill takes less than 2 seconds.  Neurological:     General: No focal deficit present.     Mental Status: She is alert.  Psychiatric:        Mood and Affect: Mood normal.     ED Results / Procedures / Treatments   Labs (all labs ordered are listed, but only abnormal results are displayed) Labs Reviewed  COMPREHENSIVE METABOLIC PANEL - Abnormal; Notable for the following components:      Result Value   Potassium 2.7 (*)    CO2 17 (*)    Glucose, Bld 100 (*)    Albumin 3.2 (*)    Alkaline Phosphatase 191 (*)    Anion gap 19 (*)    All other components within normal limits  CBC - Abnormal; Notable for the following components:   WBC 17.1 (*)    All other components within normal limits  URINALYSIS, ROUTINE W REFLEX MICROSCOPIC - Abnormal; Notable for the following components:   Color, Urine AMBER (*)    APPearance TURBID (*)    Hgb urine dipstick SMALL (*)    Ketones, ur 20 (*)    Protein, ur >=300 (*)    Leukocytes,Ua MODERATE (*)    Non Squamous Epithelial 11-20 (*)    All other components within normal limits  BETA-HYDROXYBUTYRIC ACID - Abnormal; Notable for the following components:   Beta-Hydroxybutyric Acid 0.92 (*)    All other components within normal limits  CBG MONITORING, ED - Abnormal; Notable for the following components:   Glucose-Capillary 105 (*)    All other components within normal limits  I-STAT VENOUS BLOOD GAS, ED - Abnormal; Notable for the following components:   pH, Ven 7.507 (*)    pCO2, Ven 26.4 (*)    pO2, Ven 132 (*)    Potassium 2.9 (*)    Calcium, Ion 0.98 (*)    HCT  27.0 (*)    Hemoglobin 9.2 (*)    All other components within normal limits  URINE CULTURE  LIPASE, BLOOD  BASIC METABOLIC PANEL  I-STAT BETA HCG BLOOD, ED ([facility], [facility], [facility] ONLY)    EKG EKG Interpretation  Date/Time:  Sunday March 27 2022 23:47:12 EDT Ventricular Rate:  119 PR Interval:  120 QRS Duration: 68 QT Interval:  298 QTC Calculation: 419 R Axis:   71 Text Interpretation: Sinus tachycardia Right atrial enlargement Nonspecific T wave abnormality Abnormal ECG When compared with ECG of 25-Mar-2022 21:00, PREVIOUS ECG IS PRESENT Confirmed by Quintella Reichert [id number]) on 03/27/2022 11:53:13 PM  Radiology CT Renal Stone Study  Result Date: 03/28/2022 CLINICAL DATA:  Abdominal/flank pain, stone suspected. Low dose stone #1, Rt flank pain x 2 days EXAM: CT ABDOMEN AND PELVIS WITHOUT CONTRAST TECHNIQUE: Multidetector CT imaging of the abdomen and pelvis was performed following the standard protocol without IV contrast. RADIATION DOSE REDUCTION: This exam was performed according to the departmental dose-optimization program which includes automated exposure control, adjustment of the mA and/or kV according to patient size and/or use of iterative reconstruction technique. COMPARISON:  CT abdomen pelvis 08/31/2021 FINDINGS: Lower chest: Patchy left lower lobe ground-glass airspace opacities. Hepatobiliary: No focal liver abnormality. No gallstones, gallbladder wall thickening, or pericholecystic fluid. No biliary dilatation. Pancreas: No focal lesion. Normal pancreatic contour. No surrounding inflammatory changes. No main pancreatic ductal dilatation. Spleen: Normal in size without focal abnormality. Adrenals/Urinary Tract: No adrenal nodule bilaterally. Slight perinephric stranding on the right asymmetric compared to the left. No nephrolithiasis and no hydronephrosis. No definite contour-deforming renal mass. No ureterolithiasis or hydroureter. The urinary bladder is decompressed with circumferential  urinary bladder wall thickening. Stomach/Bowel: Stomach is within normal limits. No evidence of bowel wall thickening or dilatation. The appendix is not definitely identified with no inflammatory changes in the right lower quadrant to suggest acute appendicitis. Vascular/Lymphatic: No abdominal aorta or iliac aneurysm. No abdominal, pelvic, or inguinal lymphadenopathy. Reproductive: Uterus and bilateral adnexa are unremarkable. Other: Trace simple free fluid within the pelvis that may be physiologic. No intraperitoneal free gas. No organized fluid collection. Musculoskeletal: No abdominal wall hernia or abnormality. No suspicious lytic or blastic osseous lesions. No acute displaced fracture. IMPRESSION: 1. Patchy left lower lobe ground-glass airspace opacities. May represent atelectasis versus infection/inflammation. 2. Cystitis with possible right pyelonephritis-limited evaluation on this noncontrast study. No nephroureterolithiasis. Correlate with urinalysis. 3. The appendix is not definitely identified with no inflammatory changes in the right lower quadrant to suggest acute appendicitis. Electronically Signed   By: Iven Finn M.D.   On: 03/28/2022 01:50    Procedures Procedures    Medications Ordered in ED Medications  potassium chloride 10 mEq in 100 mL IVPB (10 mEq Intravenous New Bag/Given 03/28/22 0551)  lactated ringers bolus 1,000 mL (0 mLs Intravenous Stopped 03/28/22 0539)  potassium chloride SA (KLOR-CON M) CR tablet 40 mEq (40 mEq Oral Given 03/28/22 0341)  ketorolac (TORADOL) 30 MG/ML injection 15 mg (15 mg Intravenous Given 03/28/22 0337)  cefTRIAXone (ROCEPHIN) 2 g in sodium chloride 0.9 % 100 mL IVPB (0 g Intravenous Stopped 03/28/22 0553)    ED Course/ Medical Decision Making/ A&P                             Medical Decision Making Amount and/or Complexity of Data Reviewed Labs: ordered. Radiology: ordered.  Risk Prescription drug management.   22 year old female  presenting with flank pain, nausea, vomiting, and feeling poorly.  On arrival, she is uncomfortable appearing, however nontoxic.  She is tachycardic initially on arrival but this improved without intervention.  Other vitals reassuring.  She does have significant right-sided CVA tenderness.  Labs ordered, reviewed, potassium of 2.7, which is repleted.  She has a leukocytosis of 17.1.  Lipase is  normal.  Pregnancy is negative.  Her UA shows ketones, moderate leukocytes, more than 50 WBCs, 11-29 squamous cells.  Questionable DKA, however her BMP shows a anion gap of 19, CO2 of 17 and a glucose of 100.  Her VBG actually shows a metabolic alkalosis.  Her CT renal stone study reviewed, agree with radiology read, shows possible pyelonephritis, left lower lobe atelectasis versus infection however she denies any respiratory symptoms.  EKG reviewed, consistent with sinus tachycardia.   Will give IV fluids, Toradol for pain, LR bolus and Rocephin for UTI/pyelonephritis.  She was also given oral and IV potassium repletion.  Plan to recheck a BMP after this has been completed.  Care signed out following repeat BMP and potassium repletion. Suspect she will be stable for discharge w/ antibiotics for pyelonephritis.  Final Clinical Impression(s) / ED Diagnoses Final diagnoses:  None    Rx / DC Orders ED Discharge Orders     None         Lyndel Safe 03/28/22 Anastasio Champion, MD 03/28/22 [phone]

## 2022-03-28 NOTE — ED Provider Notes (Signed)
Signout received on this 22 year old female with history of type 1 diabetes who presented with abdominal pain.  On workup she was found to have pyelonephritis, hypokalemia.  Potassium repletion was ordered.   Physical Exam  BP (!) 77/41 (BP Location: Right Arm)   Pulse 86   Temp 98.4 F (36.9 C) (Oral)   Resp 18   Ht 5\' 1"  (1.549 m)   Wt 47.6 kg   LMP 02/16/2022 (Approximate)   SpO2 97%   BMI 19.84 kg/m    Procedures  .Critical Care  Performed by: Evlyn Courier, PA-C Authorized by: Evlyn Courier, PA-C   Critical care provider statement:    Critical care time (minutes):  34   Critical care was necessary to treat or prevent imminent or life-threatening deterioration of the following conditions:  Shock and sepsis   Critical care was time spent personally by me on the following activities:  Development of treatment plan with patient or surrogate, discussions with consultants, evaluation of patient's response to treatment, examination of patient, ordering and review of laboratory studies, ordering and review of radiographic studies, ordering and performing treatments and interventions, pulse oximetry, re-evaluation of patient's condition and review of old charts   ED Course / MDM   Clinical Course as of 03/28/22 1014  Mon Mar 28, 2022  0718 I was called to evaluate at bedside for hypotensive sepsis.  Patient hypotensive/tachycardic this morning with a white count of 17 and appears to have a developing ascending urinary tract infection. [CC]    Clinical Course User Index [CC] Tretha Sciara, MD   Medical Decision Making Amount and/or Complexity of Data Reviewed Labs: ordered. Radiology: ordered.  Risk Prescription drug management. Decision regarding hospitalization.   On reevaluation patient is noted to be hypotensive.  Lab findings are consistent with DKA.  Will provide additional fluids, obtain blood cultures given she is hypotensive in the setting of pyelonephritis.  She has  already received 2 g of ceftriaxone.  VBG shows evidence of alkalosis.  Repeat chemistry panel shows improved potassium at 3.1.  Patient will require admission.  Will provide additional fluids and ensure that pressures are staying stable.  Initially discussed with intensivist.  They feel he does not meet criteria for ICU.  Will discuss with hospitalist.  Case discussed with hospitalist will evaluate patient for admission.       Evlyn Courier, PA-C 03/28/22 [place], MD 03/29/22 [phone]

## 2022-03-28 NOTE — ED Notes (Signed)
Tried to get patient blood I had no success. 
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
## 2022-03-28 NOTE — Sepsis Progress Note (Signed)
Sepsis protocol is being followed by [facility].  Antibiotics started prior to sepsis protocol being ordered.

## 2022-03-28 NOTE — ED Notes (Signed)
Lactic 2.8. MD Tamala Julian made aware.

## 2022-03-28 NOTE — Plan of Care (Signed)

## 2022-03-28 NOTE — Progress Notes (Signed)
Pt is alert and oriented times 4. Pt c/o pain and received prn (see MAR), noted. Pt had a reaction to Toradol (generalized itching) and received Benadryl per MD order, noted.

## 2022-03-28 NOTE — Progress Notes (Addendum)
TRH night cross cover note:   I was notified by RN of the patient's report of flank/abdominal discomfort refractory to existing order for as needed Norco.  Additionally, patient experiencing some additional nausea/vomiting.  As ordered for prn IV Zofran.  I subsequently added as needed IV Dilaudid 0.5 mg q2h prn to further address refractory flank discomfort.   Update: After initial dose of Dilaudid 0.5 mg IV, the patient continues to report suboptimal pain control relating to her flank discomfort.  I subsequently increased dose of Dilaudid to 1 mg IV every 2 hours as needed and added as needed IV Toradol after confirming normal renal function.   Update: As needed Benadryl added for pruritus in the absence of report of any rash.   Babs Bertin, DO Hospitalist

## 2022-03-28 NOTE — ED Notes (Signed)
Was only able to obtain 1 set of blood culture. EDP Countryman made aware.

## 2022-03-28 NOTE — ED Notes (Signed)
Pt's BP keeps on trending down to 70s. EDP and PA both made aware twice.

## 2022-03-29 DIAGNOSIS — A419 Sepsis, unspecified organism: Secondary | ICD-10-CM | POA: Diagnosis not present

## 2022-03-29 DIAGNOSIS — E1065 Type 1 diabetes mellitus with hyperglycemia: Secondary | ICD-10-CM | POA: Diagnosis not present

## 2022-03-29 DIAGNOSIS — N12 Tubulo-interstitial nephritis, not specified as acute or chronic: Secondary | ICD-10-CM | POA: Diagnosis not present

## 2022-03-29 DIAGNOSIS — R6520 Severe sepsis without septic shock: Secondary | ICD-10-CM | POA: Diagnosis not present

## 2022-03-29 LAB — GLUCOSE, CAPILLARY
Glucose-Capillary: 55 mg/dL — ABNORMAL LOW (ref 70–99)
Glucose-Capillary: 61 mg/dL — ABNORMAL LOW (ref 70–99)
Glucose-Capillary: 109 mg/dL — ABNORMAL HIGH (ref 70–99)
Glucose-Capillary: 63 mg/dL — ABNORMAL LOW (ref 70–99)
Glucose-Capillary: 113 mg/dL — ABNORMAL HIGH (ref 70–99)
Glucose-Capillary: 59 mg/dL — ABNORMAL LOW (ref 70–99)
Glucose-Capillary: 231 mg/dL — ABNORMAL HIGH (ref 70–99)

## 2022-03-29 LAB — CBC
HCT: 28 % — ABNORMAL LOW (ref 36.0–46.0)
Hemoglobin: 9.2 g/dL — ABNORMAL LOW (ref 12.0–15.0)
MCH: 28.7 pg (ref 26.0–34.0)
MCHC: 32.9 g/dL (ref 30.0–36.0)
MCV: 87.2 fL (ref 80.0–100.0)
Platelets: 194 10*3/uL (ref 150–400)
RBC: 3.21 MIL/uL — ABNORMAL LOW (ref 3.87–5.11)
RDW: 13.8 % (ref 11.5–15.5)
WBC: 18.9 10*3/uL — ABNORMAL HIGH (ref 4.0–10.5)
nRBC: 0 % (ref 0.0–0.2)

## 2022-03-29 LAB — BASIC METABOLIC PANEL
Anion gap: 11 (ref 5–15)
BUN: 10 mg/dL (ref 6–20)
CO2: 19 mmol/L — ABNORMAL LOW (ref 22–32)
Calcium: 7.9 mg/dL — ABNORMAL LOW (ref 8.9–10.3)
Chloride: 107 mmol/L (ref 98–111)
Creatinine, Ser: 0.64 mg/dL (ref 0.44–1.00)
GFR, Estimated: >60 mL/min (ref >60)
Glucose, Bld: 145 mg/dL — ABNORMAL HIGH (ref 70–99)
Potassium: 4.9 mmol/L (ref 3.5–5.1)
Sodium: 137 mmol/L (ref 135–145)

## 2022-03-29 LAB — MAGNESIUM: Magnesium: 2.1 mg/dL (ref 1.7–2.4)

## 2022-03-29 LAB — PROCALCITONIN: Procalcitonin: 7.27 ng/mL

## 2022-03-29 LAB — C-REACTIVE PROTEIN: CRP: 21.8 mg/dL — ABNORMAL HIGH (ref <1.0)

## 2022-03-29 MED ORDER — INSULIN GLARGINE-YFGN 100 UNIT/ML ~~LOC~~ SOLN
30.0000 [IU] | Freq: Every day | SUBCUTANEOUS | Status: DC
  Filled 2022-03-29: qty 0.3

## 2022-03-29 MED ORDER — MENTHOL 3 MG MT LOZG
1.0000 | LOZENGE | OROMUCOSAL | Status: DC | PRN
  Administered 2022-03-29: 3 mg via ORAL
  Filled 2022-03-29: qty 9

## 2022-03-29 MED ORDER — INSULIN GLARGINE-YFGN 100 UNIT/ML ~~LOC~~ SOLN
10.0000 [IU] | Freq: Every day | SUBCUTANEOUS | Status: DC
  Administered 2022-03-29: 10 [IU] via SUBCUTANEOUS
  Filled 2022-03-29: qty 0.1

## 2022-03-29 MED ORDER — BENZONATATE 100 MG PO CAPS
200.0000 mg | ORAL_CAPSULE | Freq: Three times a day (TID) | ORAL | Status: DC | PRN
  Administered 2022-03-29 – 2022-03-30 (×3): 200 mg via ORAL
  Filled 2022-03-29 (×3): qty 2

## 2022-03-29 MED ORDER — LACTATED RINGERS IV SOLN: INTRAVENOUS | Status: AC

## 2022-03-29 MED ORDER — MORPHINE SULFATE (PF) 2 MG/ML IV SOLN
2.0000 mg | INTRAVENOUS | Status: DC | PRN
  Administered 2022-03-29 – 2022-04-01 (×12): 2 mg via INTRAVENOUS
  Filled 2022-03-29 (×12): qty 1

## 2022-03-29 NOTE — Plan of Care (Signed)
  Problem: Education: Goal: Ability to describe self-care measures that may prevent or decrease complications (Diabetes Survival Skills Education) will improve Outcome: Progressing Goal: Individualized Educational Video(s) Outcome: Progressing   Problem: Cardiac: Goal: Ability to maintain an adequate cardiac output will improve Outcome: Progressing   Problem: Health Behavior/Discharge Planning: Goal: Ability to identify and utilize available resources and services will improve Outcome: Progressing Goal: Ability to manage health-related needs will improve Outcome: Progressing   Problem: Fluid Volume: Goal: Ability to achieve a balanced intake and output will improve Outcome: Progressing   Problem: Metabolic: Goal: Ability to maintain appropriate glucose levels will improve Outcome: Progressing   Problem: Nutritional: Goal: Maintenance of adequate nutrition will improve Outcome: Progressing Goal: Maintenance of adequate weight for body size and type will improve Outcome: Progressing   Problem: Respiratory: Goal: Will regain and/or maintain adequate ventilation Outcome: Progressing   Problem: Urinary Elimination: Goal: Ability to achieve and maintain adequate renal perfusion and functioning will improve Outcome: Progressing   Problem: Education: Goal: Ability to describe self-care measures that may prevent or decrease complications (Diabetes Survival Skills Education) will improve Outcome: Progressing Goal: Individualized Educational Video(s) Outcome: Progressing   Problem: Coping: Goal: Ability to adjust to condition or change in health will improve Outcome: Progressing   Problem: Fluid Volume: Goal: Ability to maintain a balanced intake and output will improve Outcome: Progressing   Problem: Health Behavior/Discharge Planning: Goal: Ability to identify and utilize available resources and services will improve Outcome: Progressing Goal: Ability to manage  health-related needs will improve Outcome: Progressing   Problem: Metabolic: Goal: Ability to maintain appropriate glucose levels will improve Outcome: Progressing   Problem: Nutritional: Goal: Maintenance of adequate nutrition will improve Outcome: Progressing Goal: Progress toward achieving an optimal weight will improve Outcome: Progressing   Problem: Skin Integrity: Goal: Risk for impaired skin integrity will decrease Outcome: Progressing   Problem: Tissue Perfusion: Goal: Adequacy of tissue perfusion will improve Outcome: Progressing   Problem: Education: Goal: Knowledge of General Education information will improve Description: Including pain rating scale, medication(s)/side effects and non-pharmacologic comfort measures Outcome: Progressing   Problem: Clinical Measurements: Goal: Ability to maintain clinical measurements within normal limits will improve Outcome: Progressing Goal: Will remain free from infection Outcome: Progressing Goal: Diagnostic test results will improve Outcome: Progressing Goal: Respiratory complications will improve Outcome: Progressing Goal: Cardiovascular complication will be avoided Outcome: Progressing   Problem: Health Behavior/Discharge Planning: Goal: Ability to manage health-related needs will improve Outcome: Progressing   Problem: Activity: Goal: Risk for activity intolerance will decrease Outcome: Progressing   Problem: Nutrition: Goal: Adequate nutrition will be maintained Outcome: Progressing   Problem: Coping: Goal: Level of anxiety will decrease Outcome: Progressing   Problem: Elimination: Goal: Will not experience complications related to bowel motility Outcome: Progressing Goal: Will not experience complications related to urinary retention Outcome: Progressing   Problem: Pain Managment: Goal: General experience of comfort will improve Outcome: Progressing   Problem: Safety: Goal: Ability to remain free  from injury will improve Outcome: Progressing   Problem: Skin Integrity: Goal: Risk for impaired skin integrity will decrease Outcome: Progressing

## 2022-03-29 NOTE — H&P (Signed)
  Transition of Care [facility]) Screening Note   Patient Details  Name: Dawn [name] Date of Birth: [birth year]   Transition of Care [place]) CM/SW Contact:    Levonne Lapping, RN Phone Number: 03/29/2022, 9:30 AM    Transition of Care Department [facility]) has reviewed patient chart . We will continue to monitor patient advancement through interdisciplinary progression rounds. If new patient transition needs arise, please place a TOC consult.

## 2022-03-29 NOTE — Progress Notes (Signed)
Pt is alert and orieted times 4 with stable vital signs. Pt c/o new onset of a cough, MD notified and pt received prn (see MAR) as ordered by MD, noted.

## 2022-03-29 NOTE — Progress Notes (Signed)
Hypoglycemic Event  CBG: 59  Treatment: 8 oz juice/soda  Symptoms: None  Follow-up CBG: Time:1615 CBG Result:113  Possible Reasons for Event: Inadequate meal intake  Comments/MD notified: Followed hypoglycemic event protocol.  Notified Dr. Candiss Norse.    Dawn [name]

## 2022-03-29 NOTE — Progress Notes (Addendum)
Pt is alert and oriented times 4. Pt vital signs are stable. Pt blood sugar decreased to 55 hypoglycemic protocol was activated ( see protocol in orders) Pt blood sugar is 109, noted. Md notified.

## 2022-03-29 NOTE — Progress Notes (Signed)
TRH night cross cover note:   I was notified by RN that the plan regarding basal insulin was to decrease evening dose from 30 units of Semglee to 10 units.  I subsequently made this adjustment, modifying 30 units of Semglee to 10 units Semglee nightly, first dose to occur now.    Babs Bertin, DO Hospitalist

## 2022-03-29 NOTE — Progress Notes (Signed)
TRH night cross cover note:   I was notified by RN of the patient's request for an antitussive medication.  I subsequently placed order for prn Tessalon Perles.     Babs Bertin, DO Hospitalist

## 2022-03-29 NOTE — Progress Notes (Signed)
PROGRESS NOTE                                                                                                                                                                                                             Patient Demographics:    Dawn [name], is a 22 y.o. female, DOB - Feb 13, 2000, [id number]  Outpatient Primary MD for the patient is [name], MD    LOS - 1  Admit date - 03/27/2022    Chief Complaint  Patient presents with   Abdominal Pain       Brief Narrative (HPI from H&P)   22 y.o. female with medical history significant of poorly controlled diabetes mellitus type 1 with frequent admissions for DKA who presents with complaints of 4-day history of right-sided flank pain.  She describes the pain as sharp and constant.  Noted associated symptoms of nausea and vomiting, but has been able to keep some things down..  She denies having any dysuria symptoms, but did report that her urine was dark in color.  ER workup Suggestive of right-sided pyelonephritis and she was admitted.   Subjective:    [name] today has, No headache, No chest pain, No abdominal pain - No Nausea, No new weakness tingling or numbness, shortness of breath, still feels weak with some right-sided flank pain.   Assessment  & Plan :    Sepsis present at the time of admission due to right-sided pyelonephritis.  Patient placed on IV fluids along with empiric IV Rocephin, pain control, encouraged to ambulate and stay active, CT scan does not show any ureteric obstruction, sepsis pathophysiology almost resolved, continue to monitor.  Follow cultures.   Uncontrolled DM type I.  Question plans at home, A1c 12.2, here on [place] and sliding scale, adjusted for better control on 03/29/2022.  Lab Results  Component Value Date   HGBA1C 12.2 (H) 02/18/2022   CBG (last 3)  Recent Labs    03/29/22 0419 03/29/22 0437 03/29/22 0500   GLUCAP 61* 63* 109*        Condition - Satble  Family Communication  :  None present  Code Status :  Full  Consults  :  None  PUD Prophylaxis :    Procedures  :     CT - 1. Patchy left lower lobe ground-glass airspace opacities. May  represent atelectasis versus infection/inflammation. 2. Cystitis with possible right pyelonephritis-limited evaluation on this noncontrast study. No nephroureterolithiasis. Correlate with urinalysis. 3. The appendix is not definitely identified with no inflammatory changes in the right lower quadrant to suggest acute appendicitis      Disposition Plan  :    Status is: Inpatient   DVT Prophylaxis  :    enoxaparin (LOVENOX) injection 40 mg Start: 03/28/22 1100    Lab Results  Component Value Date   PLT 194 03/29/2022    Diet :  Diet Order             Diet Carb Modified Fluid consistency: Thin; Room service appropriate? Yes  Diet effective now                    Inpatient Medications  Scheduled Meds:  enoxaparin (LOVENOX) injection  40 mg Subcutaneous Q24H   insulin aspart  0-15 Units Subcutaneous TID WC   insulin aspart  0-5 Units Subcutaneous QHS   insulin glargine-yfgn  35 Units Subcutaneous QHS   pantoprazole (PROTONIX) IV  40 mg Intravenous Q24H   sodium chloride flush  3 mL Intravenous Q12H   Continuous Infusions:  sodium chloride 250 mL (03/28/22 2239)   cefTRIAXone (ROCEPHIN)  IV 2 g (03/29/22 0553)   PRN Meds:.acetaminophen **OR** acetaminophen, albuterol, diphenhydrAMINE, HYDROcodone-acetaminophen, HYDROmorphone (DILAUDID) injection, ketorolac, naLOXone (NARCAN)  injection, ondansetron **OR** ondansetron (ZOFRAN) IV     Objective:   Vitals:   03/28/22 1943 03/29/22 0022 03/29/22 0430 03/29/22 0700  BP: 130/83 116/78 125/80 116/76  Pulse: 94 95 84 85  Resp: 17 19 20 16   Temp: 97.6 F (36.4 C) 97.8 F (36.6 C) 97.9 F (36.6 C) 98.3 F (36.8 C)  TempSrc: Oral Oral Oral Oral  SpO2: 100% 100% 100% 94%   Weight:      Height:        Wt Readings from Last 3 Encounters:  03/27/22 47.6 kg  03/25/22 49.7 kg  03/06/22 49.7 kg    No intake or output data in the 24 hours ending 03/29/22 0954   Physical Exam  Awake Alert, No new F.N deficits, Normal affect [place].AT,PERRAL Supple Neck, No JVD,   Symmetrical Chest wall movement, Good air movement bilaterally, CTAB RRR,No Gallops,Rubs or new Murmurs,  +ve B.Sounds, Abd Soft, No tenderness, does have right-sided flank tenderness No Cyanosis, Clubbing or edema       Data Review:    Recent Labs  Lab 03/25/22 2052 03/28/22 0000 03/28/22 0412 03/29/22 0541  WBC 8.9 17.1*  --  18.9*  HGB 9.0* 12.3 9.2* 9.2*  HCT 27.5* 38.1 27.0* 28.0*  PLT 267 335  --  194  MCV 88.7 89.0  --  87.2  MCH 29.0 28.7  --  28.7  MCHC 32.7 32.3  --  32.9  RDW 13.8 13.6  --  13.8    Recent Labs  Lab 03/25/22 2052 03/28/22 0000 03/28/22 0412 03/28/22 0603 03/28/22 0807 03/28/22 1501 03/29/22 0541 03/29/22 0803  NA 133* 140 145 141  --   --  137  --   K 3.7 2.7* 2.9* 3.1*  --   --  4.9  --   CL 106 104  --  108  --   --  107  --   CO2 19* 17*  --  21*  --   --  19*  --   ANIONGAP 8 19*  --  12  --   --  11  --  GLUCOSE 281* 100*  --  97  --   --  145*  --   BUN 5* 6  --  7  --   --  10  --   CREATININE 0.58 0.90  --  0.79  --   --  0.64  --   AST  --  19  --   --   --   --   --   --   ALT  --  15  --   --   --   --   --   --   ALKPHOS  --  191*  --   --   --   --   --   --   BILITOT  --  1.2  --   --   --   --   --   --   ALBUMIN  --  3.2*  --   --   --   --   --   --   CRP  --   --   --   --   --   --   --  21.8*  LATICACIDVEN  --   --   --   --  2.8* 1.9  --   --   MG  --   --   --   --   --   --   --  2.1  CALCIUM 8.2* 9.4  --  8.4*  --   --  7.9*  --       Recent Labs  Lab 03/25/22 2052 03/28/22 0000 03/28/22 0603 03/28/22 0807 03/28/22 1501 03/29/22 0541 03/29/22 0803  CRP  --   --   --   --   --   --  21.8*  LATICACIDVEN   --   --   --  2.8* 1.9  --   --   MG  --   --   --   --   --   --  2.1  CALCIUM 8.2* 9.4 8.4*  --   --  7.9*  --       Lab Results  Component Value Date   HGBA1C 12.2 (H) 02/18/2022    No results for input(s): "TSH", "T4TOTAL", "T3FREE", "THYROIDAB" in the last 72 hours.  Invalid input(s): "FREET3" ------------------------------------------------------------------------------------------------------------------ Cardiac Enzymes No results for input(s): "CKMB", "TROPONINI", "MYOGLOBIN" in the last 168 hours.  Invalid input(s): "CK"  Micro Results Recent Results (from the past 240 hour(s))  Urine Culture     Status: Abnormal (Preliminary result)   Collection Time: 03/28/22  5:39 AM   Specimen: Urine, Clean Catch  Result Value Ref Range Status   Specimen Description URINE, CLEAN CATCH  Final   Special Requests NONE  Final   Culture (A)  Final    >=100,000 COLONIES/mL GRAM NEGATIVE RODS SUSCEPTIBILITIES TO FOLLOW Performed at [facility], [name], [place], [place] [address]    Report Status PENDING  Incomplete    Radiology Reports CT Renal Stone Study  Result Date: 03/28/2022 CLINICAL DATA:  Abdominal/flank pain, stone suspected. Low dose stone #1, Rt flank pain x 2 days EXAM: CT ABDOMEN AND PELVIS WITHOUT CONTRAST TECHNIQUE: Multidetector CT imaging of the abdomen and pelvis was performed following the standard protocol without IV contrast. RADIATION DOSE REDUCTION: This exam was performed according to the departmental dose-optimization program which includes automated exposure control, adjustment of the mA and/or kV according to patient size and/or use of iterative reconstruction  technique. COMPARISON:  CT abdomen pelvis 08/31/2021 FINDINGS: Lower chest: Patchy left lower lobe ground-glass airspace opacities. Hepatobiliary: No focal liver abnormality. No gallstones, gallbladder wall thickening, or pericholecystic fluid. No biliary dilatation. Pancreas: No focal  lesion. Normal pancreatic contour. No surrounding inflammatory changes. No main pancreatic ductal dilatation. Spleen: Normal in size without focal abnormality. Adrenals/Urinary Tract: No adrenal nodule bilaterally. Slight perinephric stranding on the right asymmetric compared to the left. No nephrolithiasis and no hydronephrosis. No definite contour-deforming renal mass. No ureterolithiasis or hydroureter. The urinary bladder is decompressed with circumferential urinary bladder wall thickening. Stomach/Bowel: Stomach is within normal limits. No evidence of bowel wall thickening or dilatation. The appendix is not definitely identified with no inflammatory changes in the right lower quadrant to suggest acute appendicitis. Vascular/Lymphatic: No abdominal aorta or iliac aneurysm. No abdominal, pelvic, or inguinal lymphadenopathy. Reproductive: Uterus and bilateral adnexa are unremarkable. Other: Trace simple free fluid within the pelvis that may be physiologic. No intraperitoneal free gas. No organized fluid collection. Musculoskeletal: No abdominal wall hernia or abnormality. No suspicious lytic or blastic osseous lesions. No acute displaced fracture. IMPRESSION: 1. Patchy left lower lobe ground-glass airspace opacities. May represent atelectasis versus infection/inflammation. 2. Cystitis with possible right pyelonephritis-limited evaluation on this noncontrast study. No nephroureterolithiasis. Correlate with urinalysis. 3. The appendix is not definitely identified with no inflammatory changes in the right lower quadrant to suggest acute appendicitis. Electronically Signed   By: Iven Finn M.D.   On: 03/28/2022 01:50      Signature  -   Lala Lund M.D on 03/29/2022 at 9:54 AM   -  To page go to [email or web address]

## 2022-03-30 ENCOUNTER — Inpatient Hospital Stay (HOSPITAL_COMMUNITY): Payer: BLUE CROSS/BLUE SHIELD

## 2022-03-30 DIAGNOSIS — A419 Sepsis, unspecified organism: Secondary | ICD-10-CM | POA: Diagnosis not present

## 2022-03-30 DIAGNOSIS — R6520 Severe sepsis without septic shock: Secondary | ICD-10-CM | POA: Diagnosis not present

## 2022-03-30 DIAGNOSIS — N12 Tubulo-interstitial nephritis, not specified as acute or chronic: Secondary | ICD-10-CM | POA: Diagnosis not present

## 2022-03-30 DIAGNOSIS — E1065 Type 1 diabetes mellitus with hyperglycemia: Secondary | ICD-10-CM | POA: Diagnosis not present

## 2022-03-30 LAB — GLUCOSE, CAPILLARY
Glucose-Capillary: 130 mg/dL — ABNORMAL HIGH (ref 70–99)
Glucose-Capillary: 67 mg/dL — ABNORMAL LOW (ref 70–99)
Glucose-Capillary: 181 mg/dL — ABNORMAL HIGH (ref 70–99)
Glucose-Capillary: 176 mg/dL — ABNORMAL HIGH (ref 70–99)

## 2022-03-30 LAB — CBC WITH DIFFERENTIAL/PLATELET
Abs Immature Granulocytes: 0.08 10*3/uL — ABNORMAL HIGH (ref 0.00–0.07)
Basophils Absolute: 0 10*3/uL (ref 0.0–0.1)
Basophils Relative: 0 %
Eosinophils Absolute: 0.1 10*3/uL (ref 0.0–0.5)
Eosinophils Relative: 1 %
HCT: 27.6 % — ABNORMAL LOW (ref 36.0–46.0)
Hemoglobin: 9.4 g/dL — ABNORMAL LOW (ref 12.0–15.0)
Immature Granulocytes: 1 %
Lymphocytes Relative: 17 %
Lymphs Abs: 2.5 10*3/uL (ref 0.7–4.0)
MCH: 29.7 pg (ref 26.0–34.0)
MCHC: 34.1 g/dL (ref 30.0–36.0)
MCV: 87.3 fL (ref 80.0–100.0)
Monocytes Absolute: 1.2 10*3/uL — ABNORMAL HIGH (ref 0.1–1.0)
Monocytes Relative: 8 %
Neutro Abs: 10.6 10*3/uL — ABNORMAL HIGH (ref 1.7–7.7)
Neutrophils Relative %: 73 %
Platelets: 238 10*3/uL (ref 150–400)
RBC: 3.16 MIL/uL — ABNORMAL LOW (ref 3.87–5.11)
RDW: 13.9 % (ref 11.5–15.5)
WBC: 14.5 10*3/uL — ABNORMAL HIGH (ref 4.0–10.5)
nRBC: 0 % (ref 0.0–0.2)

## 2022-03-30 LAB — BASIC METABOLIC PANEL
Anion gap: 9 (ref 5–15)
BUN: 6 mg/dL (ref 6–20)
CO2: 25 mmol/L (ref 22–32)
Calcium: 8 mg/dL — ABNORMAL LOW (ref 8.9–10.3)
Chloride: 102 mmol/L (ref 98–111)
Creatinine, Ser: 0.73 mg/dL (ref 0.44–1.00)
GFR, Estimated: >60 mL/min (ref >60)
Glucose, Bld: 200 mg/dL — ABNORMAL HIGH (ref 70–99)
Potassium: 3.5 mmol/L (ref 3.5–5.1)
Sodium: 136 mmol/L (ref 135–145)

## 2022-03-30 LAB — PROCALCITONIN: Procalcitonin: 4.84 ng/mL

## 2022-03-30 LAB — BRAIN NATRIURETIC PEPTIDE: B Natriuretic Peptide: 416.8 pg/mL — ABNORMAL HIGH (ref 0.0–100.0)

## 2022-03-30 LAB — C-REACTIVE PROTEIN: CRP: 19.9 mg/dL — ABNORMAL HIGH (ref <1.0)

## 2022-03-30 LAB — URINE CULTURE: Culture: >=100000 — AB

## 2022-03-30 LAB — MAGNESIUM: Magnesium: 1.9 mg/dL (ref 1.7–2.4)

## 2022-03-30 MED ORDER — INSULIN GLARGINE-YFGN 100 UNIT/ML ~~LOC~~ SOLN
12.0000 [IU] | Freq: Two times a day (BID) | SUBCUTANEOUS | Status: DC
  Administered 2022-03-30 – 2022-03-31 (×2): 12 [IU] via SUBCUTANEOUS
  Filled 2022-03-30 (×4): qty 0.12

## 2022-03-30 MED ORDER — POTASSIUM CHLORIDE CRYS ER 20 MEQ PO TBCR
40.0000 meq | EXTENDED_RELEASE_TABLET | Freq: Once | ORAL | Status: AC
  Administered 2022-03-30: 40 meq via ORAL
  Filled 2022-03-30: qty 2

## 2022-03-30 MED ORDER — KETOROLAC TROMETHAMINE 30 MG/ML IJ SOLN
30.0000 mg | Freq: Once | INTRAMUSCULAR | Status: AC
  Administered 2022-03-30: 30 mg via INTRAVENOUS
  Filled 2022-03-30: qty 1

## 2022-03-30 NOTE — Progress Notes (Signed)
Pt refused 1400 subq Lovenox dose (Red med refusal). MD notified. Patient extensively educated on risks of not receiving dose. Patient verbally agreed and nodded appropriately.

## 2022-03-30 NOTE — Plan of Care (Signed)
  Problem: Education: Goal: Ability to describe self-care measures that may prevent or decrease complications (Diabetes Survival Skills Education) will improve Outcome: Progressing Goal: Individualized Educational Video(s) Outcome: Progressing   Problem: Cardiac: Goal: Ability to maintain an adequate cardiac output will improve Outcome: Progressing   Problem: Health Behavior/Discharge Planning: Goal: Ability to identify and utilize available resources and services will improve Outcome: Progressing Goal: Ability to manage health-related needs will improve Outcome: Progressing   Problem: Fluid Volume: Goal: Ability to achieve a balanced intake and output will improve Outcome: Progressing   

## 2022-03-30 NOTE — Progress Notes (Signed)
PROGRESS NOTE                                                                                                                                                                                                             Patient Demographics:    Dawn [name], is a 22 y.o. female, DOB - 26-Dec-2000, [id number]  Outpatient Primary MD for the patient is [name], MD    LOS - 2  Admit date - 03/27/2022    Chief Complaint  Patient presents with   Abdominal Pain       Brief Narrative (HPI from H&P)   22 y.o. female with medical history significant of poorly controlled diabetes mellitus type 1 with frequent admissions for DKA who presents with complaints of 4-day history of right-sided flank pain.  She describes the pain as sharp and constant.  Noted associated symptoms of nausea and vomiting, but has been able to keep some things down..  She denies having any dysuria symptoms, but did report that her urine was dark in color.  ER workup Suggestive of right-sided pyelonephritis and she was admitted.   Subjective:   [name], appears comfortable, denies any headache, no fever, no chest pain or pressure, no shortness of breath , no abdominal pain. No new focal weakness.    Assessment  & Plan :   Sepsis present at the time of admission due to right-sided pansensitive E. coli pyelonephritis.  Patient placed on IV fluids along with empiric IV Rocephin, pain control, encouraged to ambulate and stay active, CT scan does not show any ureteric obstruction, sepsis pathophysiology almost resolved, however she still is not considerable flank pain more than I would expect on day 3, repeat renal ultrasound to rule out any obstruction, continue to monitor.  Urine cultures noted.   Uncontrolled DM type I.  Question compliance at home, A1c 12.2, here on [place] and sliding scale, adjusted for better control on 03/29/2022.  Lab Results   Component Value Date   HGBA1C 12.2 (H) 02/18/2022   CBG (last 3)  Recent Labs    03/29/22 1624 03/29/22 2121 03/30/22 0812  GLUCAP 113* 231* 130*        Condition - Satble  Family Communication  :  None present  Code Status :  Full  Consults  :  None  PUD Prophylaxis :  Procedures  :     CT - 1. Patchy left lower lobe ground-glass airspace opacities. May represent atelectasis versus infection/inflammation. 2. Cystitis with possible right pyelonephritis-limited evaluation on this noncontrast study. No nephroureterolithiasis. Correlate with urinalysis. 3. The appendix is not definitely identified with no inflammatory changes in the right lower quadrant to suggest acute appendicitis      Disposition Plan  :    Status is: Inpatient   DVT Prophylaxis  :    enoxaparin (LOVENOX) injection 40 mg Start: 03/28/22 1100    Lab Results  Component Value Date   PLT 238 03/30/2022    Diet :  Diet Order             Diet Carb Modified Fluid consistency: Thin; Room service appropriate? Yes  Diet effective now                    Inpatient Medications  Scheduled Meds:  enoxaparin (LOVENOX) injection  40 mg Subcutaneous Q24H   insulin aspart  0-15 Units Subcutaneous TID WC   insulin aspart  0-5 Units Subcutaneous QHS   insulin glargine-yfgn  12 Units Subcutaneous BID   pantoprazole (PROTONIX) IV  40 mg Intravenous Q24H   potassium chloride  40 mEq Oral Once   sodium chloride flush  3 mL Intravenous Q12H   Continuous Infusions:  sodium chloride 250 mL (03/28/22 2239)   cefTRIAXone (ROCEPHIN)  IV 2 g (03/30/22 0500)   lactated ringers 75 mL/hr at 03/30/22 0605   PRN Meds:.acetaminophen **OR** acetaminophen, albuterol, benzonatate, diphenhydrAMINE, HYDROcodone-acetaminophen, menthol-cetylpyridinium, morphine injection, naLOXone (NARCAN)  injection, ondansetron **OR** ondansetron (ZOFRAN) IV     Objective:   Vitals:   03/29/22 2013 03/29/22 2320 03/30/22  0450 03/30/22 0700  BP: 137/83 131/70 (!) 157/98 119/71  Pulse: (!) 106 (!) 108 (!) 103 (!) 102  Resp: 20 19 19 20   Temp: 99.1 F (37.3 C) 99.2 F (37.3 C) 99.3 F (37.4 C) 98.3 F (36.8 C)  TempSrc: Oral Oral Oral Oral  SpO2: 94% 94% 96% 95%  Weight:      Height:        Wt Readings from Last 3 Encounters:  03/27/22 47.6 kg  03/25/22 49.7 kg  03/06/22 49.7 kg    No intake or output data in the 24 hours ending 03/30/22 0857   Physical Exam  Awake Alert, No new F.N deficits, Normal affect [place].AT,PERRAL Supple Neck, No JVD,   Symmetrical Chest wall movement, Good air movement bilaterally, CTAB RRR,No Gallops,Rubs or new Murmurs,  +ve B.Sounds, Abd Soft, No tenderness, does have right-sided flank tenderness No Cyanosis, Clubbing or edema       Data Review:    Recent Labs  Lab 03/25/22 2052 03/28/22 0000 03/28/22 0412 03/29/22 0541 03/30/22 0308  WBC 8.9 17.1*  --  18.9* 14.5*  HGB 9.0* 12.3 9.2* 9.2* 9.4*  HCT 27.5* 38.1 27.0* 28.0* 27.6*  PLT 267 335  --  194 238  MCV 88.7 89.0  --  87.2 87.3  MCH 29.0 28.7  --  28.7 29.7  MCHC 32.7 32.3  --  32.9 34.1  RDW 13.8 13.6  --  13.8 13.9  LYMPHSABS  --   --   --   --  2.5  MONOABS  --   --   --   --  1.2*  EOSABS  --   --   --   --  0.1  BASOSABS  --   --   --   --  0.0    Recent Labs  Lab 03/25/22 2052 03/28/22 0000 03/28/22 0412 03/28/22 0603 03/28/22 0807 03/28/22 1501 03/29/22 0541 03/29/22 0803 03/30/22 0308  NA 133* 140 145 141  --   --  137  --  136  K 3.7 2.7* 2.9* 3.1*  --   --  4.9  --  3.5  CL 106 104  --  108  --   --  107  --  102  CO2 19* 17*  --  21*  --   --  19*  --  25  ANIONGAP 8 19*  --  12  --   --  11  --  9  GLUCOSE 281* 100*  --  97  --   --  145*  --  200*  BUN 5* 6  --  7  --   --  10  --  6  CREATININE 0.58 0.90  --  0.79  --   --  0.64  --  0.73  AST  --  19  --   --   --   --   --   --   --   ALT  --  15  --   --   --   --   --   --   --   ALKPHOS  --  191*  --   --    --   --   --   --   --   BILITOT  --  1.2  --   --   --   --   --   --   --   ALBUMIN  --  3.2*  --   --   --   --   --   --   --   CRP  --   --   --   --   --   --   --  21.8* 19.9*  PROCALCITON  --   --   --   --   --   --   --  7.27 4.84  LATICACIDVEN  --   --   --   --  2.8* 1.9  --   --   --   BNP  --   --   --   --   --   --   --   --  416.8*  MG  --   --   --   --   --   --   --  2.1 1.9  CALCIUM 8.2* 9.4  --  8.4*  --   --  7.9*  --  8.0*      Recent Labs  Lab 03/25/22 2052 03/28/22 0000 03/28/22 0603 03/28/22 0807 03/28/22 1501 03/29/22 0541 03/29/22 0803 03/30/22 0308  CRP  --   --   --   --   --   --  21.8* 19.9*  PROCALCITON  --   --   --   --   --   --  7.27 4.84  LATICACIDVEN  --   --   --  2.8* 1.9  --   --   --   BNP  --   --   --   --   --   --   --  416.8*  MG  --   --   --   --   --   --  2.1 1.9  CALCIUM 8.2* 9.4 8.4*  --   --  7.9*  --  8.0*      Lab Results  Component Value Date   HGBA1C 12.2 (H) 02/18/2022    Radiology Reports CT Renal Stone Study  Result Date: 03/28/2022 CLINICAL DATA:  Abdominal/flank pain, stone suspected. Low dose stone #1, Rt flank pain x 2 days EXAM: CT ABDOMEN AND PELVIS WITHOUT CONTRAST TECHNIQUE: Multidetector CT imaging of the abdomen and pelvis was performed following the standard protocol without IV contrast. RADIATION DOSE REDUCTION: This exam was performed according to the departmental dose-optimization program which includes automated exposure control, adjustment of the mA and/or kV according to patient size and/or use of iterative reconstruction technique. COMPARISON:  CT abdomen pelvis 08/31/2021 FINDINGS: Lower chest: Patchy left lower lobe ground-glass airspace opacities. Hepatobiliary: No focal liver abnormality. No gallstones, gallbladder wall thickening, or pericholecystic fluid. No biliary dilatation. Pancreas: No focal lesion. Normal pancreatic contour. No surrounding inflammatory changes. No main pancreatic ductal  dilatation. Spleen: Normal in size without focal abnormality. Adrenals/Urinary Tract: No adrenal nodule bilaterally. Slight perinephric stranding on the right asymmetric compared to the left. No nephrolithiasis and no hydronephrosis. No definite contour-deforming renal mass. No ureterolithiasis or hydroureter. The urinary bladder is decompressed with circumferential urinary bladder wall thickening. Stomach/Bowel: Stomach is within normal limits. No evidence of bowel wall thickening or dilatation. The appendix is not definitely identified with no inflammatory changes in the right lower quadrant to suggest acute appendicitis. Vascular/Lymphatic: No abdominal aorta or iliac aneurysm. No abdominal, pelvic, or inguinal lymphadenopathy. Reproductive: Uterus and bilateral adnexa are unremarkable. Other: Trace simple free fluid within the pelvis that may be physiologic. No intraperitoneal free gas. No organized fluid collection. Musculoskeletal: No abdominal wall hernia or abnormality. No suspicious lytic or blastic osseous lesions. No acute displaced fracture. IMPRESSION: 1. Patchy left lower lobe ground-glass airspace opacities. May represent atelectasis versus infection/inflammation. 2. Cystitis with possible right pyelonephritis-limited evaluation on this noncontrast study. No nephroureterolithiasis. Correlate with urinalysis. 3. The appendix is not definitely identified with no inflammatory changes in the right lower quadrant to suggest acute appendicitis. Electronically Signed   By: Iven Finn M.D.   On: 03/28/2022 01:50      Signature  -   Lala Lund M.D on 03/30/2022 at 8:57 AM   -  To page go to [email or web address]

## 2022-03-31 DIAGNOSIS — N12 Tubulo-interstitial nephritis, not specified as acute or chronic: Secondary | ICD-10-CM | POA: Diagnosis not present

## 2022-03-31 LAB — CBC WITH DIFFERENTIAL/PLATELET
Abs Immature Granulocytes: 0.04 10*3/uL (ref 0.00–0.07)
Basophils Absolute: 0 10*3/uL (ref 0.0–0.1)
Basophils Relative: 0 %
Eosinophils Absolute: 0.2 10*3/uL (ref 0.0–0.5)
Eosinophils Relative: 2 %
HCT: 27.7 % — ABNORMAL LOW (ref 36.0–46.0)
Hemoglobin: 9 g/dL — ABNORMAL LOW (ref 12.0–15.0)
Immature Granulocytes: 0 %
Lymphocytes Relative: 29 %
Lymphs Abs: 3 10*3/uL (ref 0.7–4.0)
MCH: 28.4 pg (ref 26.0–34.0)
MCHC: 32.5 g/dL (ref 30.0–36.0)
MCV: 87.4 fL (ref 80.0–100.0)
Monocytes Absolute: 1.2 10*3/uL — ABNORMAL HIGH (ref 0.1–1.0)
Monocytes Relative: 12 %
Neutro Abs: 5.7 10*3/uL (ref 1.7–7.7)
Neutrophils Relative %: 57 %
Platelets: 264 10*3/uL (ref 150–400)
RBC: 3.17 MIL/uL — ABNORMAL LOW (ref 3.87–5.11)
RDW: 13.9 % (ref 11.5–15.5)
WBC: 10.1 10*3/uL (ref 4.0–10.5)
nRBC: 0 % (ref 0.0–0.2)

## 2022-03-31 LAB — BASIC METABOLIC PANEL
Anion gap: 9 (ref 5–15)
BUN: <5 mg/dL — ABNORMAL LOW (ref 6–20)
CO2: 24 mmol/L (ref 22–32)
Calcium: 8.2 mg/dL — ABNORMAL LOW (ref 8.9–10.3)
Chloride: 105 mmol/L (ref 98–111)
Creatinine, Ser: 0.59 mg/dL (ref 0.44–1.00)
GFR, Estimated: >60 mL/min (ref >60)
Glucose, Bld: 137 mg/dL — ABNORMAL HIGH (ref 70–99)
Potassium: 3.3 mmol/L — ABNORMAL LOW (ref 3.5–5.1)
Sodium: 138 mmol/L (ref 135–145)

## 2022-03-31 LAB — GLUCOSE, CAPILLARY
Glucose-Capillary: 81 mg/dL (ref 70–99)
Glucose-Capillary: 86 mg/dL (ref 70–99)
Glucose-Capillary: 262 mg/dL — ABNORMAL HIGH (ref 70–99)
Glucose-Capillary: 79 mg/dL (ref 70–99)

## 2022-03-31 LAB — MAGNESIUM: Magnesium: 1.9 mg/dL (ref 1.7–2.4)

## 2022-03-31 LAB — PROCALCITONIN: Procalcitonin: 2.36 ng/mL

## 2022-03-31 LAB — C-REACTIVE PROTEIN: CRP: 13.7 mg/dL — ABNORMAL HIGH (ref <1.0)

## 2022-03-31 MED ORDER — INSULIN GLARGINE-YFGN 100 UNIT/ML ~~LOC~~ SOLN
12.0000 [IU] | Freq: Every day | SUBCUTANEOUS | Status: DC
  Administered 2022-04-01: 12 [IU] via SUBCUTANEOUS
  Filled 2022-03-31: qty 0.12

## 2022-03-31 MED ORDER — INSULIN ASPART 100 UNIT/ML IJ SOLN: 0.0000 [IU] | Freq: Every day | INTRAMUSCULAR | Status: DC

## 2022-03-31 MED ORDER — INSULIN ASPART 100 UNIT/ML IJ SOLN
0.0000 [IU] | Freq: Three times a day (TID) | INTRAMUSCULAR | Status: DC
  Administered 2022-03-31: 5 [IU] via SUBCUTANEOUS

## 2022-03-31 MED ORDER — POTASSIUM CHLORIDE CRYS ER 20 MEQ PO TBCR
20.0000 meq | EXTENDED_RELEASE_TABLET | Freq: Once | ORAL | Status: AC
  Administered 2022-03-31: 20 meq via ORAL
  Filled 2022-03-31: qty 1

## 2022-03-31 MED ORDER — LACTATED RINGERS IV SOLN: INTRAVENOUS | Status: AC

## 2022-03-31 MED ORDER — POTASSIUM CHLORIDE CRYS ER 20 MEQ PO TBCR
40.0000 meq | EXTENDED_RELEASE_TABLET | Freq: Once | ORAL | Status: AC
  Administered 2022-03-31: 40 meq via ORAL
  Filled 2022-03-31: qty 2

## 2022-03-31 NOTE — Progress Notes (Signed)
PROGRESS NOTE                                                                                                                                                                                                             Patient Demographics:    Dawn [name], is a 22 y.o. female, DOB - 12-01-2000, ET:[id number]  Outpatient Primary MD for the patient is [name], MD    LOS - 3  Admit date - 03/27/2022    Chief Complaint  Patient presents with   Abdominal Pain       Brief Narrative (HPI from H&P)   22 y.o. female with medical history significant of poorly controlled diabetes mellitus type 1 with frequent admissions for DKA who presents with complaints of 4-day history of right-sided flank pain.  She describes the pain as sharp and constant.  Noted associated symptoms of nausea and vomiting, but has been able to keep some things down..  She denies having any dysuria symptoms, but did report that her urine was dark in color.  ER workup Suggestive of right-sided pyelonephritis and she was admitted.   Subjective:   [name], appears comfortable, denies any headache, no fever, no chest pain or pressure, no shortness of breath , no abdominal pain. No new focal weakness. R sided flank pain - better.   Assessment  & Plan :   Sepsis present at the time of admission due to right-sided pansensitive E. coli pyelonephritis.  Patient placed on IV fluids along with empiric IV Rocephin, pain control, encouraged to ambulate and stay active, CT scan does not show any ureteric obstruction, sepsis pathophysiology almost resolved, however she still is not considerable flank pain more than I would expect on day 3, repeat renal ultrasound on 03/30/2022 appears stable, continue to monitor.  Urine cultures noted.  If improving likely discharge home on 04/01/2022.   Uncontrolled DM type I.  Question compliance at home, A1c 12.2, here on [place] and  sliding scale, adjusted for better control on 03/31/2022.  Lab Results  Component Value Date   HGBA1C 12.2 (H) 02/18/2022   CBG (last 3)  Recent Labs    03/30/22 1705 03/30/22 2110 03/31/22 0816  GLUCAP 181* 176* 81        Condition - Satble  Family Communication  :  None present  Code Status :  Full  Consults  :  None  PUD Prophylaxis :    Procedures  :     CT - 1. Patchy left lower lobe ground-glass airspace opacities. May represent atelectasis versus infection/inflammation. 2. Cystitis with possible right pyelonephritis-limited evaluation on this noncontrast study. No nephroureterolithiasis. Correlate with urinalysis. 3. The appendix is not definitely identified with no inflammatory changes in the right lower quadrant to suggest acute appendicitis      Disposition Plan  :    Status is: Inpatient   DVT Prophylaxis  :    enoxaparin (LOVENOX) injection 40 mg Start: 03/28/22 1100    Lab Results  Component Value Date   PLT 264 03/31/2022    Diet :  Diet Order             Diet Carb Modified Fluid consistency: Thin; Room service appropriate? Yes  Diet effective now                    Inpatient Medications  Scheduled Meds:  enoxaparin (LOVENOX) injection  40 mg Subcutaneous Q24H   insulin aspart  0-5 Units Subcutaneous QHS   insulin aspart  0-9 Units Subcutaneous TID WC   [START ON 04/01/2022] insulin glargine-yfgn  12 Units Subcutaneous Daily   pantoprazole (PROTONIX) IV  40 mg Intravenous Q24H   potassium chloride  20 mEq Oral Once   sodium chloride flush  3 mL Intravenous Q12H   Continuous Infusions:  sodium chloride 250 mL (03/28/22 2239)   cefTRIAXone (ROCEPHIN)  IV 2 g (03/31/22 0505)   lactated ringers 100 mL/hr at 03/31/22 0851   PRN Meds:.acetaminophen **OR** acetaminophen, albuterol, benzonatate, diphenhydrAMINE, HYDROcodone-acetaminophen, menthol-cetylpyridinium, morphine injection, naLOXone (NARCAN)  injection, ondansetron **OR**  ondansetron (ZOFRAN) IV     Objective:   Vitals:   03/30/22 1950 03/31/22 0047 03/31/22 0333 03/31/22 0813  BP: (!) 138/105 98/66 110/79 128/87  Pulse: (!) 110 98 87 (!) 103  Resp: 15 19 17 20   Temp: 99.9 F (37.7 C) 98 F (36.7 C) 97.9 F (36.6 C) 98 F (36.7 C)  TempSrc: Oral Oral Oral Oral  SpO2:      Weight:      Height:        Wt Readings from Last 3 Encounters:  03/27/22 47.6 kg  03/25/22 49.7 kg  03/06/22 49.7 kg     Intake/Output Summary (Last 24 hours) at 03/31/2022 0904 Last data filed at 03/30/2022 2150 Gross per 24 hour  Intake 1582.68 ml  Output --  Net 1582.68 ml     Physical Exam  Awake Alert, No new F.N deficits, Normal affect [place].AT,PERRAL Supple Neck, No JVD,   Symmetrical Chest wall movement, Good air movement bilaterally, CTAB RRR,No Gallops,Rubs or new Murmurs,  +ve B.Sounds, Abd Soft, No tenderness, does have right-sided flank tenderness No Cyanosis, Clubbing or edema       Data Review:    Recent Labs  Lab 03/25/22 2052 03/28/22 0000 03/28/22 0412 03/29/22 0541 03/30/22 0308 03/31/22 0427  WBC 8.9 17.1*  --  18.9* 14.5* 10.1  HGB 9.0* 12.3 9.2* 9.2* 9.4* 9.0*  HCT 27.5* 38.1 27.0* 28.0* 27.6* 27.7*  PLT 267 335  --  194 238 264  MCV 88.7 89.0  --  87.2 87.3 87.4  MCH 29.0 28.7  --  28.7 29.7 28.4  MCHC 32.7 32.3  --  32.9 34.1 32.5  RDW 13.8 13.6  --  13.8 13.9 13.9  LYMPHSABS  --   --   --   --  2.5 3.0  MONOABS  --   --   --   --  1.2* 1.2*  EOSABS  --   --   --   --  0.1 0.2  BASOSABS  --   --   --   --  0.0 0.0    Recent Labs  Lab 03/28/22 0000 03/28/22 0412 03/28/22 0603 03/28/22 0807 03/28/22 1501 03/29/22 0541 03/29/22 0803 03/30/22 0308 03/31/22 0427  NA 140 145 141  --   --  137  --  136 138  K 2.7* 2.9* 3.1*  --   --  4.9  --  3.5 3.3*  CL 104  --  108  --   --  107  --  102 105  CO2 17*  --  21*  --   --  19*  --  25 24  ANIONGAP 19*  --  12  --   --  11  --  9 9  GLUCOSE 100*  --  97  --   --   145*  --  200* 137*  BUN 6  --  7  --   --  10  --  6 <5*  CREATININE 0.90  --  0.79  --   --  0.64  --  0.73 0.59  AST 19  --   --   --   --   --   --   --   --   ALT 15  --   --   --   --   --   --   --   --   ALKPHOS 191*  --   --   --   --   --   --   --   --   BILITOT 1.2  --   --   --   --   --   --   --   --   ALBUMIN 3.2*  --   --   --   --   --   --   --   --   CRP  --   --   --   --   --   --  21.8* 19.9* 13.7*  PROCALCITON  --   --   --   --   --   --  7.27 4.84 2.36  LATICACIDVEN  --   --   --  2.8* 1.9  --   --   --   --   BNP  --   --   --   --   --   --   --  416.8*  --   MG  --   --   --   --   --   --  2.1 1.9 1.9  CALCIUM 9.4  --  8.4*  --   --  7.9*  --  8.0* 8.2*    Lab Results  Component Value Date   HGBA1C 12.2 (H) 02/18/2022    Radiology Reports US RENAL  Result Date: 03/30/2022 CLINICAL DATA:  One-week history of right flank pain EXAM: RENAL / URINARY TRACT ULTRASOUND COMPLETE COMPARISON:  CT abdomen and pelvis dated 03/28/2022 FINDINGS: Right Kidney: Length = 11.4 cm AP renal pelvis diameter = <10 mm Diffusely increased cortical echogenicity with preserved corticomedullary differentiation which can be seen with medical renal disease. No urinary tract dilation or shadowing calculi. The ureter is not seen. Trace perinephric free fluid. Left Kidney: Length = 12.0  cm AP renal pelvis diameter = <10 mm Diffusely increased cortical echogenicity with preserved corticomedullary differentiation which can be seen with medical renal disease. No urinary tract dilation or shadowing calculi. The ureter is not seen. Trace perinephric free fluid. Bladder: Mild circumferential bladder mural thickening. Other: Incidentally noted small left pleural effusion. IMPRESSION: 1. Diffusely increased renal cortical echogenicity with preserved corticomedullary differentiation which can be seen with medical renal disease. No urinary tract dilation or focal sonographic abnormality. 2. Trace  perinephric free fluid and small left pleural effusion. 3. Mild circumferential bladder mural thickening in keeping with cystitis. Electronically Signed   By: Darrin Nipper M.D.   On: 03/30/2022 11:36   CT Renal Stone Study  Result Date: 03/28/2022 CLINICAL DATA:  Abdominal/flank pain, stone suspected. Low dose stone #1, Rt flank pain x 2 days EXAM: CT ABDOMEN AND PELVIS WITHOUT CONTRAST TECHNIQUE: Multidetector CT imaging of the abdomen and pelvis was performed following the standard protocol without IV contrast. RADIATION DOSE REDUCTION: This exam was performed according to the departmental dose-optimization program which includes automated exposure control, adjustment of the mA and/or kV according to patient size and/or use of iterative reconstruction technique. COMPARISON:  CT abdomen pelvis 08/31/2021 FINDINGS: Lower chest: Patchy left lower lobe ground-glass airspace opacities. Hepatobiliary: No focal liver abnormality. No gallstones, gallbladder wall thickening, or pericholecystic fluid. No biliary dilatation. Pancreas: No focal lesion. Normal pancreatic contour. No surrounding inflammatory changes. No main pancreatic ductal dilatation. Spleen: Normal in size without focal abnormality. Adrenals/Urinary Tract: No adrenal nodule bilaterally. Slight perinephric stranding on the right asymmetric compared to the left. No nephrolithiasis and no hydronephrosis. No definite contour-deforming renal mass. No ureterolithiasis or hydroureter. The urinary bladder is decompressed with circumferential urinary bladder wall thickening. Stomach/Bowel: Stomach is within normal limits. No evidence of bowel wall thickening or dilatation. The appendix is not definitely identified with no inflammatory changes in the right lower quadrant to suggest acute appendicitis. Vascular/Lymphatic: No abdominal aorta or iliac aneurysm. No abdominal, pelvic, or inguinal lymphadenopathy. Reproductive: Uterus and bilateral adnexa are  unremarkable. Other: Trace simple free fluid within the pelvis that may be physiologic. No intraperitoneal free gas. No organized fluid collection. Musculoskeletal: No abdominal wall hernia or abnormality. No suspicious lytic or blastic osseous lesions. No acute displaced fracture. IMPRESSION: 1. Patchy left lower lobe ground-glass airspace opacities. May represent atelectasis versus infection/inflammation. 2. Cystitis with possible right pyelonephritis-limited evaluation on this noncontrast study. No nephroureterolithiasis. Correlate with urinalysis. 3. The appendix is not definitely identified with no inflammatory changes in the right lower quadrant to suggest acute appendicitis. Electronically Signed   By: Iven Finn M.D.   On: 03/28/2022 01:50      Signature  -   Lala Lund M.D on 03/31/2022 at 9:04 AM   -  To page go to [email or web address]

## 2022-04-01 ENCOUNTER — Other Ambulatory Visit (HOSPITAL_COMMUNITY): Payer: Self-pay

## 2022-04-01 DIAGNOSIS — N12 Tubulo-interstitial nephritis, not specified as acute or chronic: Secondary | ICD-10-CM | POA: Diagnosis not present

## 2022-04-01 LAB — CBC WITH DIFFERENTIAL/PLATELET
Abs Immature Granulocytes: 0.04 10*3/uL (ref 0.00–0.07)
Basophils Absolute: 0 10*3/uL (ref 0.0–0.1)
Basophils Relative: 0 %
Eosinophils Absolute: 0.2 10*3/uL (ref 0.0–0.5)
Eosinophils Relative: 2 %
HCT: 28.2 % — ABNORMAL LOW (ref 36.0–46.0)
Hemoglobin: 9.1 g/dL — ABNORMAL LOW (ref 12.0–15.0)
Immature Granulocytes: 0 %
Lymphocytes Relative: 22 %
Lymphs Abs: 2.2 10*3/uL (ref 0.7–4.0)
MCH: 28.3 pg (ref 26.0–34.0)
MCHC: 32.3 g/dL (ref 30.0–36.0)
MCV: 87.6 fL (ref 80.0–100.0)
Monocytes Absolute: 1.9 10*3/uL — ABNORMAL HIGH (ref 0.1–1.0)
Monocytes Relative: 19 %
Neutro Abs: 6 10*3/uL (ref 1.7–7.7)
Neutrophils Relative %: 57 %
Platelets: 344 10*3/uL (ref 150–400)
RBC: 3.22 MIL/uL — ABNORMAL LOW (ref 3.87–5.11)
RDW: 13.9 % (ref 11.5–15.5)
WBC: 10.3 10*3/uL (ref 4.0–10.5)
nRBC: 0 % (ref 0.0–0.2)

## 2022-04-01 LAB — BASIC METABOLIC PANEL
Anion gap: 11 (ref 5–15)
BUN: <5 mg/dL — ABNORMAL LOW (ref 6–20)
CO2: 25 mmol/L (ref 22–32)
Calcium: 8.8 mg/dL — ABNORMAL LOW (ref 8.9–10.3)
Chloride: 101 mmol/L (ref 98–111)
Creatinine, Ser: 0.58 mg/dL (ref 0.44–1.00)
GFR, Estimated: >60 mL/min (ref >60)
Glucose, Bld: 82 mg/dL (ref 70–99)
Potassium: 3.2 mmol/L — ABNORMAL LOW (ref 3.5–5.1)
Sodium: 137 mmol/L (ref 135–145)

## 2022-04-01 LAB — PROCALCITONIN: Procalcitonin: 1.14 ng/mL

## 2022-04-01 LAB — GLUCOSE, CAPILLARY: Glucose-Capillary: 104 mg/dL — ABNORMAL HIGH (ref 70–99)

## 2022-04-01 LAB — C-REACTIVE PROTEIN: CRP: 13.6 mg/dL — ABNORMAL HIGH (ref <1.0)

## 2022-04-01 LAB — MAGNESIUM: Magnesium: 1.8 mg/dL (ref 1.7–2.4)

## 2022-04-01 MED ORDER — IBUPROFEN 600 MG PO TABS
600.0000 mg | ORAL_TABLET | Freq: Four times a day (QID) | ORAL | 0 refills | Status: DC | PRN
  Filled 2022-04-01: qty 20, 5d supply, fill #0

## 2022-04-01 MED ORDER — KETOROLAC TROMETHAMINE 15 MG/ML IJ SOLN: 15.0000 mg | Freq: Four times a day (QID) | INTRAMUSCULAR | Status: DC | PRN

## 2022-04-01 MED ORDER — POTASSIUM CHLORIDE CRYS ER 20 MEQ PO TBCR
40.0000 meq | EXTENDED_RELEASE_TABLET | Freq: Once | ORAL | Status: AC
  Administered 2022-04-01: 40 meq via ORAL
  Filled 2022-04-01: qty 2

## 2022-04-01 MED ORDER — CIPROFLOXACIN HCL 500 MG PO TABS
500.0000 mg | ORAL_TABLET | Freq: Two times a day (BID) | ORAL | 0 refills | Status: AC
  Filled 2022-04-01: qty 24, 12d supply, fill #0

## 2022-04-01 MED ORDER — HYDROCODONE-ACETAMINOPHEN 5-325 MG PO TABS
1.0000 | ORAL_TABLET | Freq: Three times a day (TID) | ORAL | 0 refills | Status: DC | PRN
  Filled 2022-04-01: qty 15, 5d supply, fill #0

## 2022-04-01 NOTE — Discharge Instructions (Signed)
Follow with Primary MD [name], MD in 3-4 days   Get CBC, CMP, magnesium-  checked next visit with your primary MD   Activity: As tolerated with Full fall precautions use walker/cane & assistance as needed  Disposition Home   Diet: Heart Healthy Low Carb, check CBGs q. [place] a logbook show it to PCP next visit.  Special Instructions: If you have smoked or chewed Tobacco  in the last 2 yrs please stop smoking, stop any regular Alcohol  and or any Recreational drug use.  On your next visit with your primary care physician please Get Medicines reviewed and adjusted.  Please request your Prim.MD to go over all Hospital Tests and Procedure/Radiological results at the follow up, please get all Hospital records sent to your Prim MD by signing hospital release before you go home.  If you experience worsening of your admission symptoms, develop shortness of breath, life threatening emergency, suicidal or homicidal thoughts you must seek medical attention immediately by calling 911 or calling your MD immediately  if symptoms less severe.  You Must read complete instructions/literature along with all the possible adverse reactions/side effects for all the Medicines you take and that have been prescribed to you. Take any new Medicines after you have completely understood and accpet all the possible adverse reactions/side effects.

## 2022-04-01 NOTE — Discharge Summary (Addendum)
Dawn [name] [id number] DOB: 10-27-2000 DOA: 03/27/2022  PCP: [name], MD  Admit date: 03/27/2022  Discharge date: 04/01/2022  Admitted From: Home   Disposition:  Home   Recommendations for Outpatient Follow-up:   Follow up with PCP in 1-2 weeks  PCP Please obtain BMP/CBC, 2 view CXR in 1week,  (see Discharge instructions)   PCP Please follow up on the following pending results: Check CBC, CMP, magnesium level in 3 to 4 days postdischarge.   Home Health: None   Equipment/Devices: None  Consultations: None  Discharge Condition: Stable    CODE STATUS: Full    Diet Recommendation: Heart Healthy Low Carb    Chief Complaint  Patient presents with   Abdominal Pain     Brief history of present illness from the day of admission and additional interim summary    22 y.o. female with medical history significant of poorly controlled diabetes mellitus type 1 with frequent admissions for DKA who presents with complaints of 4-day history of right-sided flank pain.  She describes the pain as sharp and constant.  Noted associated symptoms of nausea and vomiting, but has been able to keep some things down..  She denies having any dysuria symptoms, but did report that her urine was dark in color.  ER workup Suggestive of right-sided pyelonephritis and she was admitted.                                                                  Hospital Course   Sepsis present at the time of admission due to right-sided pansensitive E. coli pyelonephritis.  Was treated with IV fluids and IV Rocephin 2 g dose, getting fourth dose today, CT scan does not show any ureteric obstruction, sepsis pathophysiology was resolved, repeat renal ultrasound on 03/30/2022 again appears stable, pain serially improving and now afebrile with normal WBC  count, eager to go home today will be discharged on 12 more days of oral Cipro along with low-dose narcotics and ibuprofen as she still has moderate to severe pain at times.  Case discussed with ID physician Dr. Graylon Good.   Hypokalemia.  Replaced.    Uncontrolled DM type I.  Question compliance at home, A1c 12.2, counseled on compliance continue home regimen, requested to check CBGs q. [facility] S maintain a logbook and showed to PCP next visit.    Discharge diagnosis     Active Problems:   Pyelonephritis   Sepsis ([place])   Transient hypotension   Poorly controlled type 1 diabetes mellitus ([place])   Hypokalemia    Discharge instructions    Discharge Instructions     Discharge instructions   Complete by: As directed    Follow with Primary MD [name], MD in 3-4 days   Get CBC, CMP, magnesium-  checked next visit with  your primary MD   Activity: As tolerated with Full fall precautions use walker/cane & assistance as needed  Disposition Home   Diet: Heart Healthy Low Carb, check CBGs q. [place] a logbook show it to PCP next visit.  Special Instructions: If you have smoked or chewed Tobacco  in the last 2 yrs please stop smoking, stop any regular Alcohol  and or any Recreational drug use.  On your next visit with your primary care physician please Get Medicines reviewed and adjusted.  Please request your Prim.MD to go over all Hospital Tests and Procedure/Radiological results at the follow up, please get all Hospital records sent to your Prim MD by signing hospital release before you go home.  If you experience worsening of your admission symptoms, develop shortness of breath, life threatening emergency, suicidal or homicidal thoughts you must seek medical attention immediately by calling 911 or calling your MD immediately  if symptoms less severe.  You Must read complete instructions/literature along with all the possible adverse reactions/side effects for all the Medicines  you take and that have been prescribed to you. Take any new Medicines after you have completely understood and accpet all the possible adverse reactions/side effects.   Increase activity slowly   Complete by: As directed        Discharge Medications   Allergies as of 04/01/2022       Reactions   Latex Itching, Swelling, Other (See Comments)   Skin irritation   Tape Other (See Comments)   Skin irritation   Shellfish Allergy Itching, Rash   Scallops specifically        Medication List     STOP taking these medications    famotidine 20 MG tablet Commonly known as: PEPCID   naphazoline-pheniramine 0.025-0.3 % ophthalmic solution Commonly known as: NAPHCON-A   polyethylene glycol powder 17 GM/SCOOP powder Commonly known as: GLYCOLAX/MIRALAX       TAKE these medications    Accu-Chek Guide test strip Generic drug: glucose blood Use to check glucose 6 times daily   Accu-Chek Guide test strip Generic drug: glucose blood Use up to four times daily as directed   Accu-Chek Guide w/Device Kit Use 3 (three) times daily before meals.   Accu-Chek Softclix Lancets lancets Please use to check blood sugar up to 6 times/day.   Accu-Chek Softclix Lancets lancets Use up to four times daily   Baqsimi Two Pack 3 MG/DOSE Powd Generic drug: Glucagon Place 3 mg into the nose as needed (low blood sugar).   ciprofloxacin 500 MG tablet Commonly known as: CIPRO Take 1 tablet (500 mg total) by mouth 2 (two) times daily for 12 days.   Fiasp FlexTouch 100 UNIT/ML FlexTouch Pen Generic drug: insulin aspart Inject 10 Units into the skin in the morning, at noon, and at bedtime. Up to 3 times a day with meals What changed:  when to take this additional instructions   HumaLOG KwikPen 100 UNIT/ML KwikPen Generic drug: insulin lispro Inject 5-15 Units into the skin 3 times daily with meals- per sliding scale based on carb count. (See admin instructions.)   HYDROcodone-acetaminophen  5-325 MG tablet Commonly known as: NORCO/VICODIN Take 1 tablet by mouth every 8 (eight) hours as needed for severe pain.   ibuprofen 600 MG tablet Commonly known as: ADVIL Take 1 tablet (600 mg total) by mouth every 6 (six) hours as needed for moderate pain or mild pain.   insulin glargine 100 UNIT/ML Solostar Pen Commonly known as: LANTUS  Inject 25 Units into the skin daily.   Pentips 32G X 4 MM Misc Generic drug: Insulin Pen Needle USE TO INJECT INSULIN 6 TIMES DAILY   potassium chloride SA 20 MEQ tablet Commonly known as: KLOR-CON M Take 20 mEq by mouth daily.   PRENATAL PO Take 2 each by mouth daily. Prenatal gummies   sertraline 25 MG tablet Commonly known as: ZOLOFT Take 3 tablets (75 mg total) by mouth daily.   SUMAtriptan 25 MG tablet Commonly known as: Imitrex Take 0.5 tablets (12.5 mg total) by mouth every 2 (two) hours as needed for migraine. May repeat in 2 hours if headache persists or recurs. What changed:  when to take this additional instructions   tiZANidine 4 MG tablet Commonly known as: ZANAFLEX Take 4 mg by mouth every 8 (eight) hours as needed for muscle spasms.   [name] FlexTouch 100 UNIT/ML FlexTouch Pen Generic drug: insulin degludec Inject 40 Units into the skin daily.         Follow-up Information     [name], MD Follow up in 1 week(s).   Specialty: Internal Medicine Contact information: [place] [address] [phone]                 Major procedures and Radiology Reports - PLEASE review detailed and final reports thoroughly  -      US RENAL  Result Date: 03/30/2022 CLINICAL DATA:  One-week history of right flank pain EXAM: RENAL / URINARY TRACT ULTRASOUND COMPLETE COMPARISON:  CT abdomen and pelvis dated 03/28/2022 FINDINGS: Right Kidney: Length = 11.4 cm AP renal pelvis diameter = <10 mm Diffusely increased cortical echogenicity with preserved corticomedullary differentiation which can be seen  with medical renal disease. No urinary tract dilation or shadowing calculi. The ureter is not seen. Trace perinephric free fluid. Left Kidney: Length = 12.0 cm AP renal pelvis diameter = <10 mm Diffusely increased cortical echogenicity with preserved corticomedullary differentiation which can be seen with medical renal disease. No urinary tract dilation or shadowing calculi. The ureter is not seen. Trace perinephric free fluid. Bladder: Mild circumferential bladder mural thickening. Other: Incidentally noted small left pleural effusion. IMPRESSION: 1. Diffusely increased renal cortical echogenicity with preserved corticomedullary differentiation which can be seen with medical renal disease. No urinary tract dilation or focal sonographic abnormality. 2. Trace perinephric free fluid and small left pleural effusion. 3. Mild circumferential bladder mural thickening in keeping with cystitis. Electronically Signed   By: Darrin Nipper M.D.   On: 03/30/2022 11:36   CT Renal Stone Study  Result Date: 03/28/2022 CLINICAL DATA:  Abdominal/flank pain, stone suspected. Low dose stone #1, Rt flank pain x 2 days EXAM: CT ABDOMEN AND PELVIS WITHOUT CONTRAST TECHNIQUE: Multidetector CT imaging of the abdomen and pelvis was performed following the standard protocol without IV contrast. RADIATION DOSE REDUCTION: This exam was performed according to the departmental dose-optimization program which includes automated exposure control, adjustment of the mA and/or kV according to patient size and/or use of iterative reconstruction technique. COMPARISON:  CT abdomen pelvis 08/31/2021 FINDINGS: Lower chest: Patchy left lower lobe ground-glass airspace opacities. Hepatobiliary: No focal liver abnormality. No gallstones, gallbladder wall thickening, or pericholecystic fluid. No biliary dilatation. Pancreas: No focal lesion. Normal pancreatic contour. No surrounding inflammatory changes. No main pancreatic ductal dilatation. Spleen: Normal in  size without focal abnormality. Adrenals/Urinary Tract: No adrenal nodule bilaterally. Slight perinephric stranding on the right asymmetric compared to the left. No nephrolithiasis and no hydronephrosis. No definite  contour-deforming renal mass. No ureterolithiasis or hydroureter. The urinary bladder is decompressed with circumferential urinary bladder wall thickening. Stomach/Bowel: Stomach is within normal limits. No evidence of bowel wall thickening or dilatation. The appendix is not definitely identified with no inflammatory changes in the right lower quadrant to suggest acute appendicitis. Vascular/Lymphatic: No abdominal aorta or iliac aneurysm. No abdominal, pelvic, or inguinal lymphadenopathy. Reproductive: Uterus and bilateral adnexa are unremarkable. Other: Trace simple free fluid within the pelvis that may be physiologic. No intraperitoneal free gas. No organized fluid collection. Musculoskeletal: No abdominal wall hernia or abnormality. No suspicious lytic or blastic osseous lesions. No acute displaced fracture. IMPRESSION: 1. Patchy left lower lobe ground-glass airspace opacities. May represent atelectasis versus infection/inflammation. 2. Cystitis with possible right pyelonephritis-limited evaluation on this noncontrast study. No nephroureterolithiasis. Correlate with urinalysis. 3. The appendix is not definitely identified with no inflammatory changes in the right lower quadrant to suggest acute appendicitis. Electronically Signed   By: Iven Finn M.D.   On: 03/28/2022 01:50   DG Chest 2 View  Result Date: 03/05/2022 CLINICAL DATA:  Shortness of breath EXAM: CHEST - 2 VIEW COMPARISON:  10/17/2021 FINDINGS: The heart size and mediastinal contours are within normal limits. Both lungs are clear. The visualized skeletal structures are unremarkable. IMPRESSION: Normal study. Electronically Signed   By: Rolm Baptise M.D.   On: 03/05/2022 21:54    Micro Results    Recent Results (from the past  240 hour(s))  Urine Culture     Status: Abnormal   Collection Time: 03/28/22  5:39 AM   Specimen: Urine, Clean Catch  Result Value Ref Range Status   Specimen Description URINE, CLEAN CATCH  Final   Special Requests   Final    NONE Performed at [facility], [address] [place], [place] [address]    Culture >=100,000 COLONIES/mL ESCHERICHIA COLI (A)  Final   Report Status 03/30/2022 FINAL  Final   Organism ID, Bacteria ESCHERICHIA COLI (A)  Final      Susceptibility   Escherichia coli - MIC*    AMPICILLIN >=32 RESISTANT Resistant     CEFAZOLIN <=4 SENSITIVE Sensitive     CEFEPIME <=0.12 SENSITIVE Sensitive     CEFTRIAXONE <=0.25 SENSITIVE Sensitive     CIPROFLOXACIN <=0.25 SENSITIVE Sensitive     GENTAMICIN <=1 SENSITIVE Sensitive     IMIPENEM <=0.25 SENSITIVE Sensitive     NITROFURANTOIN <=16 SENSITIVE Sensitive     TRIMETH/SULFA >=320 RESISTANT Resistant     AMPICILLIN/SULBACTAM 16 INTERMEDIATE Intermediate     PIP/TAZO <=4 SENSITIVE Sensitive     * >=100,000 COLONIES/mL ESCHERICHIA COLI  Blood culture (routine x 2)     Status: None (Preliminary result)   Collection Time: 03/28/22  9:25 AM   Specimen: BLOOD  Result Value Ref Range Status   Specimen Description BLOOD RIGHT ANTECUBITAL  Final   Special Requests   Final    BOTTLES DRAWN AEROBIC AND ANAEROBIC Blood Culture adequate volume   Culture   Final    NO GROWTH 4 DAYS Performed at [facility], [address] [place], [place] [address]    Report Status PENDING  Incomplete  Blood culture (routine x 2)     Status: None (Preliminary result)   Collection Time: 03/28/22  3:03 PM   Specimen: BLOOD RIGHT HAND  Result Value Ref Range Status   Specimen Description BLOOD RIGHT HAND  Final   Special Requests   Final    BOTTLES DRAWN AEROBIC  AND ANAEROBIC Blood Culture results may not be optimal due to an inadequate volume of blood received in culture bottles   Culture   Final    NO GROWTH 4 DAYS Performed  at [facility] [address] [place], [place] [address]    Report Status PENDING  Incomplete    Today   Subjective    Aixa Delfin today has no headache, no chest abdominal pain, right-sided flank pain improving, no new weakness tingling or numbness, feels much better wants to go home today.    Objective   Blood pressure 96/72, pulse 96, temperature 98.6 F (37 C), temperature source Oral, resp. rate 17, height 5\' 1"  (1.549 m), weight 47.6 kg, last menstrual period 02/16/2022, SpO2 98 %.   Intake/Output Summary (Last 24 hours) at 04/01/2022 0856 Last data filed at 03/31/2022 1510 Gross per 24 hour  Intake 726.62 ml  Output --  Net 726.62 ml    Exam  Awake Alert, No new F.N deficits,    [place].AT,PERRAL Supple Neck,   Symmetrical Chest wall movement, Good air movement bilaterally, CTAB RRR,No Gallops,   +ve B.Sounds, Abd Soft, Non tender,  No Cyanosis, Clubbing or edema    Data Review   Recent Labs  Lab 03/28/22 0000 03/28/22 0412 03/29/22 0541 03/30/22 0308 03/31/22 0427 04/01/22 0702  WBC 17.1*  --  18.9* 14.5* 10.1 10.3  HGB 12.3 9.2* 9.2* 9.4* 9.0* 9.1*  HCT 38.1 27.0* 28.0* 27.6* 27.7* 28.2*  PLT 335  --  194 238 264 344  MCV 89.0  --  87.2 87.3 87.4 87.6  MCH 28.7  --  28.7 29.7 28.4 28.3  MCHC 32.3  --  32.9 34.1 32.5 32.3  RDW 13.6  --  13.8 13.9 13.9 13.9  LYMPHSABS  --   --   --  2.5 3.0 2.2  MONOABS  --   --   --  1.2* 1.2* 1.9*  EOSABS  --   --   --  0.1 0.2 0.2  BASOSABS  --   --   --  0.0 0.0 0.0    Recent Labs  Lab 03/28/22 0000 03/28/22 0412 03/28/22 0603 03/28/22 0807 03/28/22 1501 03/29/22 0541 03/29/22 0803 03/30/22 0308 03/31/22 0427 04/01/22 0702  NA 140   < > 141  --   --  137  --  136 138 137  K 2.7*   < > 3.1*  --   --  4.9  --  3.5 3.3* 3.2*  CL 104  --  108  --   --  107  --  102 105 101  CO2 17*  --  21*  --   --  19*  --  25 24 25   ANIONGAP 19*  --  12  --   --  11  --  9 9 11   GLUCOSE 100*  --  97  --   --   145*  --  200* 137* 82  BUN 6  --  7  --   --  10  --  6 <5* <5*  CREATININE 0.90  --  0.79  --   --  0.64  --  0.73 0.59 0.58  AST 19  --   --   --   --   --   --   --   --   --   ALT 15  --   --   --   --   --   --   --   --   --  ALKPHOS 191*  --   --   --   --   --   --   --   --   --   BILITOT 1.2  --   --   --   --   --   --   --   --   --   ALBUMIN 3.2*  --   --   --   --   --   --   --   --   --   CRP  --   --   --   --   --   --  21.8* 19.9* 13.7* 13.6*  PROCALCITON  --   --   --   --   --   --  7.27 4.84 2.36  --   LATICACIDVEN  --   --   --  2.8* 1.9  --   --   --   --   --   BNP  --   --   --   --   --   --   --  416.8*  --   --   MG  --   --   --   --   --   --  2.1 1.9 1.9 1.8  CALCIUM 9.4  --  8.4*  --   --  7.9*  --  8.0* 8.2* 8.8*   < > = values in this interval not displayed.    Total Time in preparing paper work, data evaluation and todays exam - 35 minutes  Signature  -    Lala Lund M.D on 04/01/2022 at 8:56 AM   -  To page go to [email or web address]

## 2022-04-02 LAB — CULTURE, BLOOD (ROUTINE X 2)
Culture: NO GROWTH
Culture: NO GROWTH
Special Requests: ADEQUATE

## 2022-04-13 LAB — GLUCOSE, CAPILLARY
Glucose-Capillary: 214 mg/dL — ABNORMAL HIGH (ref 70–99)
Glucose-Capillary: 142 mg/dL — ABNORMAL HIGH (ref 70–99)
Glucose-Capillary: 84 mg/dL (ref 70–99)
Glucose-Capillary: 64 mg/dL — ABNORMAL LOW (ref 70–99)

## 2022-04-26 ENCOUNTER — Ambulatory Visit
Admit: 2022-04-26 | Discharge: 2022-04-26 | Disposition: A | Discharge status: Home / Self Care | Payer: BLUE CROSS/BLUE SHIELD | Attending: Nurse Practitioner | Admitting: Nurse Practitioner

## 2022-04-26 DIAGNOSIS — R109 Unspecified abdominal pain: Secondary | ICD-10-CM

## 2022-04-28 ENCOUNTER — Ambulatory Visit: Payer: Self-pay | Admitting: Urology

## 2022-05-10 ENCOUNTER — Other Ambulatory Visit: Payer: Self-pay | Admitting: Nurse Practitioner

## 2022-05-10 DIAGNOSIS — N644 Mastodynia: Secondary | ICD-10-CM

## 2022-05-13 ENCOUNTER — Inpatient Hospital Stay: Admit: 2022-05-13 | Payer: Medicaid Other

## 2022-05-13 ENCOUNTER — Other Ambulatory Visit: Payer: Medicaid Other

## 2022-05-15 ENCOUNTER — Emergency Department (HOSPITAL_COMMUNITY): Payer: Medicaid Other

## 2022-05-15 ENCOUNTER — Encounter (HOSPITAL_COMMUNITY): Payer: Self-pay | Admitting: Pulmonary Disease

## 2022-05-15 ENCOUNTER — Inpatient Hospital Stay (HOSPITAL_COMMUNITY): Payer: Medicaid Other

## 2022-05-15 ENCOUNTER — Inpatient Hospital Stay (HOSPITAL_COMMUNITY)
Admission: EM | Admit: 2022-05-15 | Discharge: 2022-05-18 | DRG: 638 | Disposition: A | Discharge status: Home / Self Care | Payer: Medicaid Other | Attending: Critical Care Medicine | Admitting: Critical Care Medicine

## 2022-05-15 DIAGNOSIS — A419 Sepsis, unspecified organism: Secondary | ICD-10-CM | POA: Diagnosis not present

## 2022-05-15 DIAGNOSIS — Z608 Other problems related to social environment: Secondary | ICD-10-CM | POA: Diagnosis present

## 2022-05-15 DIAGNOSIS — Y92009 Unspecified place in unspecified non-institutional (private) residence as the place of occurrence of the external cause: Secondary | ICD-10-CM | POA: Diagnosis not present

## 2022-05-15 DIAGNOSIS — R680 Hypothermia, not associated with low environmental temperature: Secondary | ICD-10-CM | POA: Diagnosis present

## 2022-05-15 DIAGNOSIS — N179 Acute kidney failure, unspecified: Secondary | ICD-10-CM | POA: Diagnosis present

## 2022-05-15 DIAGNOSIS — T383X6A Underdosing of insulin and oral hypoglycemic [antidiabetic] drugs, initial encounter: Secondary | ICD-10-CM | POA: Diagnosis present

## 2022-05-15 DIAGNOSIS — Z794 Long term (current) use of insulin: Secondary | ICD-10-CM | POA: Diagnosis not present

## 2022-05-15 DIAGNOSIS — E860 Dehydration: Secondary | ICD-10-CM | POA: Diagnosis present

## 2022-05-15 DIAGNOSIS — E1010 Type 1 diabetes mellitus with ketoacidosis without coma: Secondary | ICD-10-CM | POA: Diagnosis not present

## 2022-05-15 DIAGNOSIS — F909 Attention-deficit hyperactivity disorder, unspecified type: Secondary | ICD-10-CM | POA: Diagnosis present

## 2022-05-15 DIAGNOSIS — Z825 Family history of asthma and other chronic lower respiratory diseases: Secondary | ICD-10-CM

## 2022-05-15 DIAGNOSIS — Z8249 Family history of ischemic heart disease and other diseases of the circulatory system: Secondary | ICD-10-CM

## 2022-05-15 DIAGNOSIS — E870 Hyperosmolality and hypernatremia: Secondary | ICD-10-CM | POA: Diagnosis not present

## 2022-05-15 DIAGNOSIS — E875 Hyperkalemia: Secondary | ICD-10-CM | POA: Diagnosis present

## 2022-05-15 DIAGNOSIS — Z91040 Latex allergy status: Secondary | ICD-10-CM | POA: Diagnosis not present

## 2022-05-15 DIAGNOSIS — F32A Depression, unspecified: Secondary | ICD-10-CM | POA: Diagnosis present

## 2022-05-15 DIAGNOSIS — Z833 Family history of diabetes mellitus: Secondary | ICD-10-CM | POA: Diagnosis not present

## 2022-05-15 DIAGNOSIS — Z79899 Other long term (current) drug therapy: Secondary | ICD-10-CM

## 2022-05-15 DIAGNOSIS — J45909 Unspecified asthma, uncomplicated: Secondary | ICD-10-CM | POA: Diagnosis present

## 2022-05-15 DIAGNOSIS — Z1152 Encounter for screening for COVID-19: Secondary | ICD-10-CM | POA: Diagnosis not present

## 2022-05-15 DIAGNOSIS — E876 Hypokalemia: Secondary | ICD-10-CM | POA: Diagnosis present

## 2022-05-15 DIAGNOSIS — R7989 Other specified abnormal findings of blood chemistry: Secondary | ICD-10-CM | POA: Diagnosis present

## 2022-05-15 DIAGNOSIS — Z91013 Allergy to seafood: Secondary | ICD-10-CM

## 2022-05-15 DIAGNOSIS — N134 Hydroureter: Secondary | ICD-10-CM | POA: Diagnosis not present

## 2022-05-15 DIAGNOSIS — E1065 Type 1 diabetes mellitus with hyperglycemia: Secondary | ICD-10-CM

## 2022-05-15 DIAGNOSIS — E1110 Type 2 diabetes mellitus with ketoacidosis without coma: Secondary | ICD-10-CM | POA: Diagnosis present

## 2022-05-15 DIAGNOSIS — Z91128 Patient's intentional underdosing of medication regimen for other reason: Secondary | ICD-10-CM | POA: Diagnosis not present

## 2022-05-15 DIAGNOSIS — R000 Tachycardia, unspecified: Secondary | ICD-10-CM | POA: Diagnosis not present

## 2022-05-15 DIAGNOSIS — Z0389 Encounter for observation for other suspected diseases and conditions ruled out: Secondary | ICD-10-CM | POA: Diagnosis not present

## 2022-05-15 LAB — LIPASE, BLOOD: Lipase: 28 U/L (ref 11–51)

## 2022-05-15 LAB — BASIC METABOLIC PANEL
BUN: 28 mg/dL — ABNORMAL HIGH (ref 6–20)
BUN: 31 mg/dL — ABNORMAL HIGH (ref 6–20)
BUN: 31 mg/dL — ABNORMAL HIGH (ref 6–20)
CO2: <7 mmol/L — ABNORMAL LOW (ref 22–32)
CO2: <7 mmol/L — ABNORMAL LOW (ref 22–32)
CO2: <7 mmol/L — ABNORMAL LOW (ref 22–32)
Calcium: 9.7 mg/dL (ref 8.9–10.3)
Calcium: 9.6 mg/dL (ref 8.9–10.3)
Calcium: 9.2 mg/dL (ref 8.9–10.3)
Chloride: 99 mmol/L (ref 98–111)
Chloride: 102 mmol/L (ref 98–111)
Chloride: 106 mmol/L (ref 98–111)
Creatinine, Ser: 1.89 mg/dL — ABNORMAL HIGH (ref 0.44–1.00)
Creatinine, Ser: 1.82 mg/dL — ABNORMAL HIGH (ref 0.44–1.00)
Creatinine, Ser: 1.74 mg/dL — ABNORMAL HIGH (ref 0.44–1.00)
GFR, Estimated: 38 mL/min — ABNORMAL LOW (ref >60)
GFR, Estimated: 40 mL/min — ABNORMAL LOW (ref >60)
GFR, Estimated: 42 mL/min — ABNORMAL LOW (ref >60)
Glucose, Bld: 829 mg/dL (ref 70–99)
Glucose, Bld: 827 mg/dL (ref 70–99)
Glucose, Bld: 571 mg/dL (ref 70–99)
Potassium: 6.5 mmol/L (ref 3.5–5.1)
Potassium: 6.9 mmol/L (ref 3.5–5.1)
Potassium: 5.1 mmol/L (ref 3.5–5.1)
Sodium: 138 mmol/L (ref 135–145)
Sodium: 138 mmol/L (ref 135–145)
Sodium: 140 mmol/L (ref 135–145)

## 2022-05-15 LAB — I-STAT VENOUS BLOOD GAS, ED
Acid-base deficit: 30 mmol/L — ABNORMAL HIGH (ref 0.0–2.0)
Bicarbonate: 3.1 mmol/L — ABNORMAL LOW (ref 20.0–28.0)
Calcium, Ion: 1.24 mmol/L (ref 1.15–1.40)
HCT: 46 % (ref 36.0–46.0)
Hemoglobin: 15.6 g/dL — ABNORMAL HIGH (ref 12.0–15.0)
O2 Saturation: 92 %
Potassium: 6.2 mmol/L — ABNORMAL HIGH (ref 3.5–5.1)
Sodium: 137 mmol/L (ref 135–145)
TCO2: <5 mmol/L — ABNORMAL LOW (ref 22–32)
pCO2, Ven: 17.2 mmHg — CL (ref 44–60)
pH, Ven: 6.865 — CL (ref 7.25–7.43)
pO2, Ven: 107 mmHg — ABNORMAL HIGH (ref 32–45)

## 2022-05-15 LAB — BLOOD GAS, ARTERIAL
Acid-base deficit: 28.1 mmol/L — ABNORMAL HIGH (ref 0.0–2.0)
Bicarbonate: 2 mmol/L — ABNORMAL LOW (ref 20.0–28.0)
O2 Saturation: 100 %
Patient temperature: 33.3
pCO2 arterial: <18 mmHg — CL (ref 32–48)
pH, Arterial: 7 — CL (ref 7.35–7.45)
pO2, Arterial: 128 mmHg — ABNORMAL HIGH (ref 83–108)

## 2022-05-15 LAB — SARS CORONAVIRUS 2 BY RT PCR: SARS Coronavirus 2 by RT PCR: NEGATIVE

## 2022-05-15 LAB — RESPIRATORY PANEL BY PCR

## 2022-05-15 LAB — I-STAT BETA HCG BLOOD, ED (MC, WL, AP ONLY)
I-stat hCG, quantitative: <5 m[IU]/mL (ref <5)
I-stat hCG, quantitative: <5 m[IU]/mL (ref <5)

## 2022-05-15 LAB — TSH: TSH: 0.838 u[IU]/mL (ref 0.350–4.500)

## 2022-05-15 LAB — CBC
HCT: 46.1 % — ABNORMAL HIGH (ref 36.0–46.0)
Hemoglobin: 13.1 g/dL (ref 12.0–15.0)
MCH: 27.8 pg (ref 26.0–34.0)
MCHC: 28.4 g/dL — ABNORMAL LOW (ref 30.0–36.0)
MCV: 97.7 fL (ref 80.0–100.0)
Platelets: 459 10*3/uL — ABNORMAL HIGH (ref 150–400)
RBC: 4.72 MIL/uL (ref 3.87–5.11)
RDW: 14.7 % (ref 11.5–15.5)
WBC: 18 10*3/uL — ABNORMAL HIGH (ref 4.0–10.5)
nRBC: 0 % (ref 0.0–0.2)

## 2022-05-15 LAB — CBC WITH DIFFERENTIAL/PLATELET
Abs Immature Granulocytes: 0 10*3/uL (ref 0.00–0.07)
Basophils Absolute: 0.5 10*3/uL — ABNORMAL HIGH (ref 0.0–0.1)
Basophils Relative: 2 %
Eosinophils Absolute: 0.5 10*3/uL (ref 0.0–0.5)
Eosinophils Relative: 2 %
HCT: 41.8 % (ref 36.0–46.0)
Hemoglobin: 12 g/dL (ref 12.0–15.0)
Lymphocytes Relative: 30 %
Lymphs Abs: 6.9 10*3/uL — ABNORMAL HIGH (ref 0.7–4.0)
MCH: 27.7 pg (ref 26.0–34.0)
MCHC: 28.7 g/dL — ABNORMAL LOW (ref 30.0–36.0)
MCV: 96.5 fL (ref 80.0–100.0)
Monocytes Absolute: 1.6 10*3/uL — ABNORMAL HIGH (ref 0.1–1.0)
Monocytes Relative: 7 %
Neutro Abs: 13.5 10*3/uL — ABNORMAL HIGH (ref 1.7–7.7)
Neutrophils Relative %: 59 %
Platelets: 386 10*3/uL (ref 150–400)
RBC: 4.33 MIL/uL (ref 3.87–5.11)
RDW: 14.5 % (ref 11.5–15.5)
WBC: 22.9 10*3/uL — ABNORMAL HIGH (ref 4.0–10.5)
nRBC: 0 % (ref 0.0–0.2)
nRBC: 0 /100 WBC

## 2022-05-15 LAB — LACTIC ACID, PLASMA: Lactic Acid, Venous: 2.3 mmol/L (ref 0.5–1.9)

## 2022-05-15 LAB — CBG MONITORING, ED
Glucose-Capillary: >600 mg/dL (ref 70–99)
Glucose-Capillary: >600 mg/dL (ref 70–99)

## 2022-05-15 LAB — GLUCOSE, CAPILLARY
Glucose-Capillary: 315 mg/dL — ABNORMAL HIGH (ref 70–99)
Glucose-Capillary: 397 mg/dL — ABNORMAL HIGH (ref 70–99)
Glucose-Capillary: 490 mg/dL — ABNORMAL HIGH (ref 70–99)

## 2022-05-15 LAB — MAGNESIUM: Magnesium: 2.4 mg/dL (ref 1.7–2.4)

## 2022-05-15 LAB — PROCALCITONIN: Procalcitonin: 1.4 ng/mL

## 2022-05-15 LAB — BETA-HYDROXYBUTYRIC ACID: Beta-Hydroxybutyric Acid: >8 mmol/L — ABNORMAL HIGH (ref 0.05–0.27)

## 2022-05-15 LAB — AMMONIA: Ammonia: 44 umol/L — ABNORMAL HIGH (ref 9–35)

## 2022-05-15 LAB — HEMOGLOBIN A1C
Hgb A1c MFr Bld: 14.3 % — ABNORMAL HIGH (ref 4.8–5.6)
Mean Plasma Glucose: 363.71 mg/dL

## 2022-05-15 LAB — PHOSPHORUS: Phosphorus: 6.6 mg/dL — ABNORMAL HIGH (ref 2.5–4.6)

## 2022-05-15 MED ORDER — SERTRALINE HCL 50 MG PO TABS
75.0000 mg | ORAL_TABLET | Freq: Every day | ORAL | Status: DC
  Administered 2022-05-17 – 2022-05-18 (×2): 75 mg via ORAL
  Filled 2022-05-15 (×2): qty 2

## 2022-05-15 MED ORDER — INSULIN REGULAR(HUMAN) IN NACL 100-0.9 UT/100ML-% IV SOLN
INTRAVENOUS | Status: DC
  Administered 2022-05-15: 5 [IU]/h via INTRAVENOUS

## 2022-05-15 MED ORDER — DEXTROSE IN LACTATED RINGERS 5 % IV SOLN: INTRAVENOUS | Status: DC

## 2022-05-15 MED ORDER — DEXTROSE 50 % IV SOLN: 0.0000 mL | INTRAVENOUS | Status: DC | PRN

## 2022-05-15 MED ORDER — LACTATED RINGERS IV BOLUS
1000.0000 mL | Freq: Once | INTRAVENOUS | Status: AC
  Administered 2022-05-15: 1000 mL via INTRAVENOUS

## 2022-05-15 MED ORDER — DOCUSATE SODIUM 100 MG PO CAPS: 100.0000 mg | ORAL_CAPSULE | Freq: Two times a day (BID) | ORAL | Status: DC | PRN

## 2022-05-15 MED ORDER — LACTATED RINGERS IV SOLN: INTRAVENOUS | Status: DC

## 2022-05-15 MED ORDER — PIPERACILLIN-TAZOBACTAM 3.375 G IVPB
3.3750 g | Freq: Three times a day (TID) | INTRAVENOUS | Status: DC
  Filled 2022-05-15 (×2): qty 50

## 2022-05-15 MED ORDER — ALBUTEROL SULFATE (2.5 MG/3ML) 0.083% IN NEBU: 2.5000 mg | INHALATION_SOLUTION | RESPIRATORY_TRACT | Status: DC | PRN

## 2022-05-15 MED ORDER — PIPERACILLIN-TAZOBACTAM 3.375 G IVPB 30 MIN: 3.3750 g | Freq: Once | INTRAVENOUS | Status: DC

## 2022-05-15 MED ORDER — SUMATRIPTAN SUCCINATE 25 MG PO TABS: 12.5000 mg | ORAL_TABLET | ORAL | Status: DC | PRN

## 2022-05-15 MED ORDER — ONDANSETRON HCL 4 MG/2ML IJ SOLN
4.0000 mg | Freq: Four times a day (QID) | INTRAMUSCULAR | Status: DC | PRN
  Administered 2022-05-16 (×2): 4 mg via INTRAVENOUS
  Filled 2022-05-15 (×2): qty 2

## 2022-05-15 MED ORDER — POLYETHYLENE GLYCOL 3350 17 G PO PACK: 17.0000 g | PACK | Freq: Every day | ORAL | Status: DC | PRN

## 2022-05-15 MED ORDER — INSULIN REGULAR(HUMAN) IN NACL 100-0.9 UT/100ML-% IV SOLN
INTRAVENOUS | Status: DC
  Administered 2022-05-15: 5 [IU]/h via INTRAVENOUS
  Filled 2022-05-15: qty 100

## 2022-05-15 MED ORDER — PIPERACILLIN-TAZOBACTAM 3.375 G IVPB
3.3750 g | Freq: Three times a day (TID) | INTRAVENOUS | Status: DC
  Administered 2022-05-15 – 2022-05-16 (×2): 3.375 g via INTRAVENOUS
  Filled 2022-05-15 (×3): qty 50

## 2022-05-15 MED ORDER — VANCOMYCIN HCL 1250 MG/250ML IV SOLN
1250.0000 mg | Freq: Once | INTRAVENOUS | Status: AC
  Administered 2022-05-15: 1250 mg via INTRAVENOUS
  Filled 2022-05-15: qty 250

## 2022-05-15 MED ORDER — ACETAMINOPHEN 325 MG PO TABS
650.0000 mg | ORAL_TABLET | ORAL | Status: DC | PRN
  Administered 2022-05-17 (×2): 650 mg via ORAL
  Filled 2022-05-15 (×2): qty 2

## 2022-05-15 MED ORDER — DEXTROSE IN LACTATED RINGERS 5 % IV SOLN: INTRAVENOUS | Status: AC

## 2022-05-15 MED ORDER — VANCOMYCIN VARIABLE DOSE PER UNSTABLE RENAL FUNCTION (PHARMACIST DOSING): Status: DC

## 2022-05-15 MED ORDER — HEPARIN SODIUM (PORCINE) 5000 UNIT/ML IJ SOLN
5000.0000 [IU] | Freq: Three times a day (TID) | INTRAMUSCULAR | Status: DC
  Administered 2022-05-16 (×2): 5000 [IU] via SUBCUTANEOUS
  Filled 2022-05-15 (×3): qty 1
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
## 2022-05-15 NOTE — ED Triage Notes (Signed)
Pt here POV states "I'm in DKA". Pt asked what symptoms she is having and she states she can not talk. Pt not participating in triage.
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
## 2022-05-15 NOTE — ED Provider Notes (Signed)
Care of the patient assumed at signout.  On signout the patient had been recognized as having hyperglycemia, concern for DKA, and initial orders for insulin were initiated.  Subsequently the patient had loss of IV access, and after some delay patient was eventually restarted on insulin.  At that point she had received 2 L fluid resuscitation, had had slight increase in her previous identified hypothermia from 91 up to 93 degrees, and interactivity had improved somewhat.  However, given the patient substantial metabolic derangements including hyperkalemia, hyperglycemia, DKA with substantial acidosis I discussed her case with our critical care team patient was admitted for further monitoring, management.   Gerhard Munch, MD 05/15/22 2023

## 2022-05-15 NOTE — Progress Notes (Signed)
Pharmacy Antibiotic Note  Dawn [name] is a 22 y.o. female for which pharmacy has been consulted for vancomycin and zosyn dosing for sepsis.  Patient with a history of T1DM, DKA, migraines, asthma, depression, ADHD. Patient presenting with DKA.  SCr 1.89 - AKI WBC 18; T 94.6; HR 107>110; RR 24>28  Plan: Zosyn 3.375g IV q8h (4 hour infusion) Vancomycin 1250 mg once, subsequent dosing as indicated per random vancomycin level until renal function stable and/or improved, at which time scheduled dosing can be considered Trend WBC, Fever, Renal function F/u cultures, clinical course, WBC De-escalate when able     Temp (24hrs), Avg:93.2 F (34 C), Min:92.1 F (33.4 C), Max:94.6 F (34.8 C)  Recent Labs  Lab 05/15/22 1521 05/15/22 1748  WBC 18.0*  --   CREATININE 1.89* 1.82*    CrCl cannot be calculated (Unknown ideal weight.).    Allergies  Allergen Reactions   Latex Itching, Swelling and Other (See Comments)    Skin irritation   Tape Other (See Comments)    Skin irritation   Shellfish Allergy Itching and Rash    Scallops specifically   Microbiology results: Pending  Thank you for allowing pharmacy to be a part of this patient's care.  Delmar Landau, PharmD, BCPS 05/15/2022 9:21 PM ED Clinical Pharmacist -  [phone]

## 2022-05-15 NOTE — ED Notes (Signed)
Critical labs given to provider glucose 827  k 6.9

## 2022-05-15 NOTE — ED Provider Notes (Signed)
[facility] AT [facility] Provider Note   CSN: [id number] Arrival date & time: 05/15/22  1325     History  No chief complaint on file.   Paxtyn Chitwood is a 22 y.o. female.  The history is provided by the patient and medical records.       Home Medications Prior to Admission medications   Medication Sig Start Date End Date Taking? Authorizing Provider  Accu-Chek Softclix Lancets lancets Please use to check blood sugar up to 6 times/day. 08/05/21   Vonna Drafts, MD  Blood Glucose Monitoring Suppl (ACCU-CHEK GUIDE ME) w/Device KIT Use 3 (three) times daily before meals. 02/18/22   Bess Kinds, MD  Fingerstix Lancets MISC Use up to four times daily 02/18/22   Carney Living, MD  Glucagon (BAQSIMI TWO PACK) 3 MG/DOSE POWD Place 3 mg into the nose as needed (low blood sugar).    [provider]  glucose blood (ACCU-CHEK GUIDE) test strip Use to check glucose 6 times daily 08/05/21   Vonna Drafts, MD  glucose blood test strip Use up to four times daily as directed 02/18/22   Bess Kinds, MD  HYDROcodone-acetaminophen (NORCO/VICODIN) 5-325 MG tablet Take 1 tablet by mouth every 8 (eight) hours as needed for severe pain. 04/01/22   Leroy Sea, MD  ibuprofen (ADVIL) 600 MG tablet Take 1 tablet (600 mg total) by mouth every 6 (six) hours as needed for moderate pain or mild pain. 04/01/22   Leroy Sea, MD  insulin aspart (FIASP FLEXTOUCH) 100 UNIT/ML FlexTouch Pen Inject 10 Units into the skin in the morning, at noon, and at bedtime. Up to 3 times a day with meals Patient taking differently: Inject 10 Units into the skin with breakfast, with lunch, and with evening meal. 03/08/22   Darlin Drop, DO  insulin glargine (LANTUS) 100 UNIT/ML Solostar Pen Inject 25 Units into the skin daily. Patient not taking: Reported on 03/31/2022 03/08/22   Darlin Drop, DO  insulin lispro (HUMALOG KWIKPEN) 100 UNIT/ML KwikPen Inject 5-15 Units into  the skin 3 times daily with meals- per sliding scale based on carb count. (See admin instructions.) Patient not taking: Reported on 03/06/2022 08/05/21 03/06/22  Vonna Drafts, MD  Insulin Pen Needle (BD PEN NEEDLE NANO 2ND GEN) 32G X 4 MM MISC USE TO INJECT INSULIN 6 TIMES DAILY 08/05/21   Vonna Drafts, MD  potassium chloride SA (KLOR-CON M) 20 MEQ tablet Take 20 mEq by mouth daily. 03/15/22 06/13/22  [provider]  Prenatal Vit-Fe Fumarate-FA (PRENATAL PO) Take 2 each by mouth daily. Prenatal gummies    [provider]  sertraline (ZOLOFT) 25 MG tablet Take 3 tablets (75 mg total) by mouth daily. 09/03/21   Sabino Dick, DO  SUMAtriptan (IMITREX) 25 MG tablet Take 0.5 tablets (12.5 mg total) by mouth every 2 (two) hours as needed for migraine. May repeat in 2 hours if headache persists or recurs. Patient taking differently: Take 12.5 mg by mouth See admin instructions. 12.5 mg daily as needed for migraine. May repeat 12.5 mg in 2 hours if headache persists or recurs. 04/12/21   Dana Allan, MD  tiZANidine (ZANAFLEX) 4 MG tablet Take 4 mg by mouth every 8 (eight) hours as needed for muscle spasms.    [provider]  [name] FLEXTOUCH 100 UNIT/ML FlexTouch Pen Inject 40 Units into the skin daily. 11/09/21   [provider]      Allergies    Latex, Tape,  and Shellfish allergy    Review of Systems   Review of Systems  Physical Exam Updated Vital Signs BP (!) 149/120   Pulse (!) 118   Resp (!) 26   SpO2 97%  Physical Exam Vitals and nursing note reviewed.  Constitutional:      General: She is in acute distress.     Appearance: She is well-developed. She is ill-appearing.     Comments: Smells of ketones  HENT:     Head: Normocephalic and atraumatic.     Mouth/Throat:     Mouth: Mucous membranes are dry.  Eyes:     Pupils: Pupils are equal, round, and reactive to light.  Cardiovascular:     Rate and Rhythm: Regular rhythm. Tachycardia present.      Heart sounds: Normal heart sounds. No murmur heard.    No friction rub.  Pulmonary:     Effort: Pulmonary effort is normal.     Breath sounds: Normal breath sounds. No wheezing or rales.     Comments: tachypneic Abdominal:     General: Bowel sounds are normal. There is no distension.     Palpations: Abdomen is soft.     Tenderness: There is abdominal tenderness. There is no guarding or rebound.     Comments: Mild diffuse pain  Musculoskeletal:        General: No tenderness. Normal range of motion.     Comments: No edema  Skin:    General: Skin is warm and dry.     Findings: No rash.  Neurological:     Mental Status: She is alert.     Cranial Nerves: No cranial nerve deficit.     Comments: Listless and seems slightly confused  Psychiatric:     Comments: Cooperative     ED Results / Procedures / Treatments   Labs (all labs ordered are listed, but only abnormal results are displayed) Labs Reviewed  CBG MONITORING, ED - Abnormal; Notable for the following components:      Result Value   Glucose-Capillary >600 (*)    All other components within normal limits  CBC  BASIC METABOLIC PANEL  BASIC METABOLIC PANEL  BASIC METABOLIC PANEL  BASIC METABOLIC PANEL  BETA-HYDROXYBUTYRIC ACID  BETA-HYDROXYBUTYRIC ACID  URINALYSIS, ROUTINE W REFLEX MICROSCOPIC  CBG MONITORING, ED  I-STAT BETA HCG BLOOD, ED ([facility], [facility], [facility] ONLY)  CBG MONITORING, ED  I-STAT VENOUS BLOOD GAS, ED    EKG None  Radiology No results found.  Procedures Procedures    EMERGENCY DEPARTMENT  US GUIDANCE EXAM Emergency Ultrasound:  US Guidance for Needle Guidance  INDICATIONS: Difficult vascular access Linear probe used in real-time to visualize location of needle entry through skin.   PERFORMED BY: Myself IMAGES ARCHIVED?: No LIMITATIONS: None VIEWS USED: Transverse INTERPRETATION: Needle visualized within vein, Right arm, and Needle gauge 20    Medications Ordered in ED Medications  lactated  ringers bolus 20 mL/kg (has no administration in time range)    ED Course/ Medical Decision Making/ A&P                             Medical Decision Making Amount and/or Complexity of Data Reviewed Labs: ordered.   Pt with multiple medical problems and comorbidities and presenting today with a complaint that caries a high risk for morbidity and mortality.  Here today with AMS, increased breathing and abd pain with vomiting.  Type 1 diabetes and unclear if taking  insulin or how long pt has been ill.  CBG is >600.  Pt appears dry here but is HD stable but tachycardic.  Pt given IVF.  Unclear precipitating event but hx of poor control and frequent DKA.  Unknown last menses.  Labs pending.         Final Clinical Impression(s) / ED Diagnoses Final diagnoses:  None    Rx / DC Orders ED Discharge Orders     None         Gwyneth Sprout, MD 05/15/22 1515

## 2022-05-15 NOTE — ED Notes (Signed)
Assumed care of patient who is currently in DKA. Pt having Kussmal's respirations altered mental status rectal temp is 93 bear hugger in place. Asked why she is not on insulin drip was told by nurse no order and  can not admit till temp is at least 95. Alerted CN on duty and MD of patient critical lab results and need for DKA orders.Orders placed at 2000 and initiated immediately there after. MD notified orders had been started and arrives to bedside to re evaluate patient. Patient pending admit to ICU

## 2022-05-15 NOTE — Progress Notes (Signed)
eLink Physician-Brief Progress Note Patient Name: Artice Goudeau DOB: [birth year] MRN: [id number]   Date of Service  05/15/2022  HPI/Events of Note  22 year old female with a history of type 1 diabetes mellitus, depression, ADHD who presented with diabetic ketoacidosis.  Patient has significant urinary retention and hypothermia down to 93.3.  She presented with hyperkalemia, severe metabolic acidosis, and hyperglycemia with elevated creatinine.  Leukocytosis.  CT abdomen/pelvis with bladder distention and hydronephrosis.  Bilateral lower lobe opacities concerning for aspiration especially in the setting of gastric distention.   eICU Interventions  In-N-Out cath x 1.  If her bladder distention persists, will place Foley catheter. --Patient was able to void independently and now has pure wick in place.  She is receiving aggressive fluids, electrolyte resuscitation, insulin with D5.  No indication for bicarb at this time  Broad-spectrum antibiotics in place  DVT prophylaxis with heparin subcutaneous No indication for GI prophylaxis.  Advance Diet as tolerated, states that she has a hangover and requesting some juice.    8657 -  Still in severe acidosis with incalculable anion gap.  Continue current management.     Intervention Category Evaluation Type: New Patient Evaluation  Rajanae Mantia 05/15/2022, 11:01 PM

## 2022-05-15 NOTE — ED Notes (Signed)
Bair hugger applied to patient.

## 2022-05-16 ENCOUNTER — Other Ambulatory Visit: Payer: Self-pay

## 2022-05-16 LAB — RAPID URINE DRUG SCREEN, HOSP PERFORMED
Amphetamines: NOT DETECTED
Barbiturates: NOT DETECTED
Benzodiazepines: NOT DETECTED
Cocaine: NOT DETECTED
Opiates: NOT DETECTED
Tetrahydrocannabinol: NOT DETECTED

## 2022-05-16 LAB — BASIC METABOLIC PANEL
Anion gap: 17 — ABNORMAL HIGH (ref 5–15)
Anion gap: 18 — ABNORMAL HIGH (ref 5–15)
Anion gap: 11 (ref 5–15)
Anion gap: 7 (ref 5–15)
BUN: 26 mg/dL — ABNORMAL HIGH (ref 6–20)
BUN: 20 mg/dL (ref 6–20)
BUN: 17 mg/dL (ref 6–20)
BUN: 15 mg/dL (ref 6–20)
BUN: 11 mg/dL (ref 6–20)
CO2: <7 mmol/L — ABNORMAL LOW (ref 22–32)
CO2: 14 mmol/L — ABNORMAL LOW (ref 22–32)
CO2: 12 mmol/L — ABNORMAL LOW (ref 22–32)
CO2: 15 mmol/L — ABNORMAL LOW (ref 22–32)
CO2: 20 mmol/L — ABNORMAL LOW (ref 22–32)
Calcium: 9 mg/dL (ref 8.9–10.3)
Calcium: 9.1 mg/dL (ref 8.9–10.3)
Calcium: 8.7 mg/dL — ABNORMAL LOW (ref 8.9–10.3)
Calcium: 8.4 mg/dL — ABNORMAL LOW (ref 8.9–10.3)
Calcium: 8.8 mg/dL — ABNORMAL LOW (ref 8.9–10.3)
Chloride: 115 mmol/L — ABNORMAL HIGH (ref 98–111)
Chloride: 117 mmol/L — ABNORMAL HIGH (ref 98–111)
Chloride: 118 mmol/L — ABNORMAL HIGH (ref 98–111)
Chloride: 120 mmol/L — ABNORMAL HIGH (ref 98–111)
Chloride: 117 mmol/L — ABNORMAL HIGH (ref 98–111)
Creatinine, Ser: 1.76 mg/dL — ABNORMAL HIGH (ref 0.44–1.00)
Creatinine, Ser: 1.53 mg/dL — ABNORMAL HIGH (ref 0.44–1.00)
Creatinine, Ser: 1.29 mg/dL — ABNORMAL HIGH (ref 0.44–1.00)
Creatinine, Ser: 1.07 mg/dL — ABNORMAL HIGH (ref 0.44–1.00)
Creatinine, Ser: 0.82 mg/dL (ref 0.44–1.00)
GFR, Estimated: 42 mL/min — ABNORMAL LOW (ref >60)
GFR, Estimated: 49 mL/min — ABNORMAL LOW (ref >60)
GFR, Estimated: >60 mL/min (ref >60)
GFR, Estimated: >60 mL/min (ref >60)
GFR, Estimated: >60 mL/min (ref >60)
Glucose, Bld: 252 mg/dL — ABNORMAL HIGH (ref 70–99)
Glucose, Bld: 228 mg/dL — ABNORMAL HIGH (ref 70–99)
Glucose, Bld: 182 mg/dL — ABNORMAL HIGH (ref 70–99)
Glucose, Bld: 186 mg/dL — ABNORMAL HIGH (ref 70–99)
Glucose, Bld: 130 mg/dL — ABNORMAL HIGH (ref 70–99)
Potassium: 4.3 mmol/L (ref 3.5–5.1)
Potassium: 3.7 mmol/L (ref 3.5–5.1)
Potassium: 3.2 mmol/L — ABNORMAL LOW (ref 3.5–5.1)
Potassium: 3.4 mmol/L — ABNORMAL LOW (ref 3.5–5.1)
Potassium: 3.4 mmol/L — ABNORMAL LOW (ref 3.5–5.1)
Sodium: 143 mmol/L (ref 135–145)
Sodium: 148 mmol/L — ABNORMAL HIGH (ref 135–145)
Sodium: 148 mmol/L — ABNORMAL HIGH (ref 135–145)
Sodium: 146 mmol/L — ABNORMAL HIGH (ref 135–145)
Sodium: 144 mmol/L (ref 135–145)

## 2022-05-16 LAB — CBC
HCT: 40 % (ref 36.0–46.0)
Hemoglobin: 11.8 g/dL — ABNORMAL LOW (ref 12.0–15.0)
MCH: 27.8 pg (ref 26.0–34.0)
MCHC: 29.5 g/dL — ABNORMAL LOW (ref 30.0–36.0)
MCV: 94.3 fL (ref 80.0–100.0)
Platelets: 333 10*3/uL (ref 150–400)
RBC: 4.24 MIL/uL (ref 3.87–5.11)
RDW: 14.6 % (ref 11.5–15.5)
WBC: 21.1 10*3/uL — ABNORMAL HIGH (ref 4.0–10.5)
nRBC: 0 % (ref 0.0–0.2)

## 2022-05-16 LAB — GLUCOSE, CAPILLARY
Glucose-Capillary: 114 mg/dL — ABNORMAL HIGH (ref 70–99)
Glucose-Capillary: 122 mg/dL — ABNORMAL HIGH (ref 70–99)
Glucose-Capillary: 124 mg/dL — ABNORMAL HIGH (ref 70–99)
Glucose-Capillary: 131 mg/dL — ABNORMAL HIGH (ref 70–99)
Glucose-Capillary: 132 mg/dL — ABNORMAL HIGH (ref 70–99)
Glucose-Capillary: 148 mg/dL — ABNORMAL HIGH (ref 70–99)
Glucose-Capillary: 160 mg/dL — ABNORMAL HIGH (ref 70–99)
Glucose-Capillary: 162 mg/dL — ABNORMAL HIGH (ref 70–99)
Glucose-Capillary: 162 mg/dL — ABNORMAL HIGH (ref 70–99)
Glucose-Capillary: 168 mg/dL — ABNORMAL HIGH (ref 70–99)
Glucose-Capillary: 173 mg/dL — ABNORMAL HIGH (ref 70–99)
Glucose-Capillary: 177 mg/dL — ABNORMAL HIGH (ref 70–99)
Glucose-Capillary: 180 mg/dL — ABNORMAL HIGH (ref 70–99)
Glucose-Capillary: 184 mg/dL — ABNORMAL HIGH (ref 70–99)
Glucose-Capillary: 185 mg/dL — ABNORMAL HIGH (ref 70–99)
Glucose-Capillary: 196 mg/dL — ABNORMAL HIGH (ref 70–99)
Glucose-Capillary: 196 mg/dL — ABNORMAL HIGH (ref 70–99)
Glucose-Capillary: 198 mg/dL — ABNORMAL HIGH (ref 70–99)
Glucose-Capillary: 207 mg/dL — ABNORMAL HIGH (ref 70–99)
Glucose-Capillary: 216 mg/dL — ABNORMAL HIGH (ref 70–99)
Glucose-Capillary: 222 mg/dL — ABNORMAL HIGH (ref 70–99)
Glucose-Capillary: 229 mg/dL — ABNORMAL HIGH (ref 70–99)
Glucose-Capillary: 241 mg/dL — ABNORMAL HIGH (ref 70–99)

## 2022-05-16 LAB — URINALYSIS, ROUTINE W REFLEX MICROSCOPIC
Bilirubin Urine: NEGATIVE
Glucose, UA: >=500 mg/dL — AB
Ketones, ur: 80 mg/dL — AB
Leukocytes,Ua: NEGATIVE
Nitrite: NEGATIVE
Protein, ur: 30 mg/dL — AB
Specific Gravity, Urine: 1.009 (ref 1.005–1.030)
pH: 5 (ref 5.0–8.0)

## 2022-05-16 LAB — CULTURE, BLOOD (ROUTINE X 2)

## 2022-05-16 LAB — BETA-HYDROXYBUTYRIC ACID
Beta-Hydroxybutyric Acid: 6.26 mmol/L — ABNORMAL HIGH (ref 0.05–0.27)
Beta-Hydroxybutyric Acid: 2.67 mmol/L — ABNORMAL HIGH (ref 0.05–0.27)

## 2022-05-16 LAB — ETHANOL: Alcohol, Ethyl (B): <10 mg/dL (ref <10)

## 2022-05-16 LAB — LACTIC ACID, PLASMA: Lactic Acid, Venous: 1.4 mmol/L (ref 0.5–1.9)

## 2022-05-16 LAB — MRSA NEXT GEN BY PCR, NASAL: MRSA by PCR Next Gen: NOT DETECTED

## 2022-05-16 MED ORDER — CHLORHEXIDINE GLUCONATE CLOTH 2 % EX PADS
6.0000 | MEDICATED_PAD | Freq: Every day | CUTANEOUS | Status: DC
  Administered 2022-05-16 – 2022-05-17 (×4): 6 via TOPICAL

## 2022-05-16 MED ORDER — INSULIN (MYXREDLIN) INFUSION FOR HYPERTRIGLYCERIDEMIA
5.0000 [IU]/h | INTRAVENOUS | Status: DC
  Administered 2022-05-16: 5 [IU]/h via INTRAVENOUS
  Filled 2022-05-16 (×2): qty 100

## 2022-05-16 MED ORDER — PROCHLORPERAZINE EDISYLATE 10 MG/2ML IJ SOLN
5.0000 mg | Freq: Four times a day (QID) | INTRAMUSCULAR | Status: DC | PRN
  Administered 2022-05-16: 5 mg via INTRAVENOUS
  Filled 2022-05-16 (×2): qty 1

## 2022-05-16 MED ORDER — ORAL CARE MOUTH RINSE
15.0000 mL | OROMUCOSAL | Status: DC
  Administered 2022-05-16 (×2): 15 mL via OROMUCOSAL

## 2022-05-16 MED ORDER — POTASSIUM CHLORIDE 10 MEQ/100ML IV SOLN
10.0000 meq | INTRAVENOUS | Status: AC
  Administered 2022-05-16 (×4): 10 meq via INTRAVENOUS
  Filled 2022-05-16 (×4): qty 100

## 2022-05-16 MED ORDER — DEXTROSE 10 % IV SOLN: INTRAVENOUS | Status: AC

## 2022-05-16 MED ORDER — DEXTROSE 10 % IV SOLN: INTRAVENOUS | Status: DC

## 2022-05-16 MED ORDER — ORAL CARE MOUTH RINSE: 15.0000 mL | OROMUCOSAL | Status: DC | PRN

## 2022-05-16 MED ORDER — POTASSIUM CHLORIDE 10 MEQ/100ML IV SOLN
10.0000 meq | INTRAVENOUS | Status: AC
  Administered 2022-05-16 (×3): 10 meq via INTRAVENOUS
  Filled 2022-05-16 (×3): qty 100

## 2022-05-16 MED ORDER — POTASSIUM CHLORIDE 10 MEQ/100ML IV SOLN
INTRAVENOUS | Status: AC
  Administered 2022-05-16: 10 meq via INTRAVENOUS
  Filled 2022-05-16: qty 100

## 2022-05-16 NOTE — Plan of Care (Signed)

## 2022-05-16 NOTE — Progress Notes (Signed)
Pt vomited about brown colored gastric content. Pt given zofran cleaned up and is now resting.

## 2022-05-16 NOTE — Progress Notes (Signed)
Called [place], [name] to given an update. L/m with number for her to call back.   Steffanie Dunn, DO 05/16/22 4:15 PM [facility]  For contact information, see Amion. If no response to pager, please call PCCM consult pager. After hours, 7PM- 7AM, please call Elink.
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
## 2022-05-16 NOTE — Progress Notes (Signed)
eLink Physician-Brief Progress Note Patient Name: Dawn [name] DOB: [birth year] MRN: [id number]   Date of Service  05/16/2022  HPI/Events of Note  22 year old female with a history of type 1 diabetes mellitus, depression, ADHD that initially presented with diabetic ketoacidosis with significant metabolic acidosis.  She was initially started on Endo tool management but had refractory diabetic ketoacidosis  She was transitioned to constant on 9 titratable insulin drip with mild improvement in her acidosis and gap.  eICU Interventions  Maintain insulin drip as currently ordered, may adjust this depending on if the gap reopens/acidosis or symptoms.  Reinstate BMP every 4 hours  If need be, will titrate D5/D5 LR to keep sugars within normal range  If there are 2 consistent BMPs and then show resolution, will consider transition, ideally this morning   0307 -BMP reviewed, gap is closed, bicarb is 20.  Glucoses still well-controlled.  Planning transition this morning.  K of 2.8, will add on magnesium and administer additional 120 mill equivalents over the course of the morning.  Encourage oral intake, discontinue fluids 2 hours after Lantus  0505 - Potassium 2.5. Magnesium 1.5; KCL IV + the PO ordered earlier. Magnesium ordered  0613 - Refusing Heparin SQ.   Intervention Category Intermediate Interventions: Hyperglycemia - evaluation and treatment  Joanthony Hamza 05/16/2022, 8:13 PM

## 2022-05-16 NOTE — Progress Notes (Signed)
Patient bladder scanned and 434 mls in bladder. Patient states that she does not want to get up to void right now, but will void later when she has more of an urge to go.

## 2022-05-16 NOTE — Inpatient Diabetes Management (Signed)
Inpatient Diabetes Program Recommendations  AACE/ADA: New Consensus Statement on Inpatient Glycemic Control (2015)  Target Ranges:  Prepandial:   less than 140 mg/dL      Peak postprandial:   less than 180 mg/dL (1-2 hours)      Critically ill patients:  140 - 180 mg/dL   Lab Results  Component Value Date   GLUCAP 177 (H) 05/16/2022   HGBA1C 14.3 (H) 05/15/2022    Latest Reference Range & Units 05/16/22 05:43  Sodium 135 - 145 mmol/L 148 (H)  Potassium 3.5 - 5.1 mmol/L 3.7  Chloride 98 - 111 mmol/L 117 (H)  CO2 22 - 32 mmol/L 14 (L)  Glucose 70 - 99 mg/dL 161 (H)  BUN 6 - 20 mg/dL 20  Creatinine 0.96 - 0.45 mg/dL 4.09 (H)  Calcium 8.9 - 10.3 mg/dL 9.1  Anion gap 5 - 15  17 (H)  (H): Data is abnormally high (L): Data is abnormally low  Latest Reference Range & Units 05/16/22 05:35  Beta-Hydroxybutyric Acid 0.05 - 0.27 mmol/L 6.26 (H)  (H): Data is abnormally high  Diabetes history: DM 1 (requires basal bolus insulin to correct glucose levels and cover carbohydrates consumed) Outpatient Diabetes medications: [name] 40 units Daily, Humalog 0-8 units tid Current orders for Inpatient glycemic control:  IV insulin/DKA/Endotool  Inpatient Diabetes Program Recommendations:   Note:Patient has type 1 diabetes and well known to our team. Patient has had 4 inpatient admissions over the past 6 months. DM coordinator spoke with patient on 02/18/22 regarding insulin administration and elevated A1c. A1c has increase from 12.2 on 02/18/22 to currently 14.3.  CO2 remains low @ 14 and anion gap 17. Agree with IV insulin for now until acidosis clears.   At time of transition consider: -  Semglee 32 units (80% home basal insulin dose) -  Novolog 0-9 units tid + hs scale -  Novolog 3 units tid meal coverage if eats>50% of meals  Thank you, Darel Hong E. Towanda Hornstein, RN, MSN, CDE  Diabetes Coordinator Inpatient Glycemic Control Team Team Pager [phone] (8am-5pm) 05/16/2022 9:31 AM

## 2022-05-17 DIAGNOSIS — E876 Hypokalemia: Secondary | ICD-10-CM

## 2022-05-17 LAB — BASIC METABOLIC PANEL
Anion gap: 10 (ref 5–15)
Anion gap: 12 (ref 5–15)
Anion gap: 15 (ref 5–15)
Anion gap: 7 (ref 5–15)
Anion gap: 9 (ref 5–15)
BUN: 6 mg/dL (ref 6–20)
BUN: 8 mg/dL (ref 6–20)
BUN: <5 mg/dL — ABNORMAL LOW (ref 6–20)
BUN: <5 mg/dL — ABNORMAL LOW (ref 6–20)
BUN: <5 mg/dL — ABNORMAL LOW (ref 6–20)
CO2: 17 mmol/L — ABNORMAL LOW (ref 22–32)
CO2: 18 mmol/L — ABNORMAL LOW (ref 22–32)
CO2: 20 mmol/L — ABNORMAL LOW (ref 22–32)
CO2: 20 mmol/L — ABNORMAL LOW (ref 22–32)
CO2: 21 mmol/L — ABNORMAL LOW (ref 22–32)
Calcium: 8.3 mg/dL — ABNORMAL LOW (ref 8.9–10.3)
Calcium: 8.4 mg/dL — ABNORMAL LOW (ref 8.9–10.3)
Calcium: 8.5 mg/dL — ABNORMAL LOW (ref 8.9–10.3)
Calcium: 8.7 mg/dL — ABNORMAL LOW (ref 8.9–10.3)
Calcium: 9 mg/dL (ref 8.9–10.3)
Chloride: 104 mmol/L (ref 98–111)
Chloride: 106 mmol/L (ref 98–111)
Chloride: 111 mmol/L (ref 98–111)
Chloride: 111 mmol/L (ref 98–111)
Chloride: 117 mmol/L — ABNORMAL HIGH (ref 98–111)
Creatinine, Ser: 0.59 mg/dL (ref 0.44–1.00)
Creatinine, Ser: 0.64 mg/dL (ref 0.44–1.00)
Creatinine, Ser: 0.7 mg/dL (ref 0.44–1.00)
Creatinine, Ser: 0.76 mg/dL (ref 0.44–1.00)
Creatinine, Ser: 0.78 mg/dL (ref 0.44–1.00)
GFR, Estimated: >60 mL/min (ref >60)
GFR, Estimated: >60 mL/min (ref >60)
GFR, Estimated: >60 mL/min (ref >60)
GFR, Estimated: >60 mL/min (ref >60)
GFR, Estimated: >60 mL/min (ref >60)
Glucose, Bld: 111 mg/dL — ABNORMAL HIGH (ref 70–99)
Glucose, Bld: 120 mg/dL — ABNORMAL HIGH (ref 70–99)
Glucose, Bld: 127 mg/dL — ABNORMAL HIGH (ref 70–99)
Glucose, Bld: 131 mg/dL — ABNORMAL HIGH (ref 70–99)
Glucose, Bld: 79 mg/dL (ref 70–99)
Potassium: 2.5 mmol/L — CL (ref 3.5–5.1)
Potassium: 2.8 mmol/L — ABNORMAL LOW (ref 3.5–5.1)
Potassium: 3.4 mmol/L — ABNORMAL LOW (ref 3.5–5.1)
Potassium: 3.7 mmol/L (ref 3.5–5.1)
Potassium: 3.7 mmol/L (ref 3.5–5.1)
Sodium: 137 mmol/L (ref 135–145)
Sodium: 138 mmol/L (ref 135–145)
Sodium: 138 mmol/L (ref 135–145)
Sodium: 141 mmol/L (ref 135–145)
Sodium: 144 mmol/L (ref 135–145)

## 2022-05-17 LAB — GLUCOSE, CAPILLARY
Glucose-Capillary: 107 mg/dL — ABNORMAL HIGH (ref 70–99)
Glucose-Capillary: 112 mg/dL — ABNORMAL HIGH (ref 70–99)
Glucose-Capillary: 118 mg/dL — ABNORMAL HIGH (ref 70–99)
Glucose-Capillary: 126 mg/dL — ABNORMAL HIGH (ref 70–99)
Glucose-Capillary: 132 mg/dL — ABNORMAL HIGH (ref 70–99)
Glucose-Capillary: 136 mg/dL — ABNORMAL HIGH (ref 70–99)
Glucose-Capillary: 152 mg/dL — ABNORMAL HIGH (ref 70–99)
Glucose-Capillary: 66 mg/dL — ABNORMAL LOW (ref 70–99)
Glucose-Capillary: 71 mg/dL (ref 70–99)
Glucose-Capillary: 90 mg/dL (ref 70–99)
Glucose-Capillary: 94 mg/dL (ref 70–99)

## 2022-05-17 LAB — MAGNESIUM: Magnesium: 1.5 mg/dL — ABNORMAL LOW (ref 1.7–2.4)

## 2022-05-17 LAB — BETA-HYDROXYBUTYRIC ACID: Beta-Hydroxybutyric Acid: 0.14 mmol/L (ref 0.05–0.27)

## 2022-05-17 LAB — URINE CULTURE: Culture: NO GROWTH

## 2022-05-17 LAB — CULTURE, BLOOD (ROUTINE X 2): Culture: NO GROWTH

## 2022-05-17 MED ORDER — MAGNESIUM SULFATE 50 % IJ SOLN
6.0000 g | Freq: Once | INTRAVENOUS | Status: AC
  Administered 2022-05-17: 6 g via INTRAVENOUS
  Filled 2022-05-17: qty 12

## 2022-05-17 MED ORDER — POTASSIUM CHLORIDE 10 MEQ/100ML IV SOLN
10.0000 meq | INTRAVENOUS | Status: DC
  Administered 2022-05-17: 10 meq via INTRAVENOUS
  Filled 2022-05-17: qty 100

## 2022-05-17 MED ORDER — POTASSIUM CHLORIDE 10 MEQ/100ML IV SOLN
10.0000 meq | INTRAVENOUS | Status: AC
  Administered 2022-05-17 (×5): 10 meq via INTRAVENOUS
  Filled 2022-05-17 (×5): qty 100

## 2022-05-17 MED ORDER — MAGNESIUM SULFATE 2 GM/50ML IV SOLN
2.0000 g | Freq: Once | INTRAVENOUS | Status: AC
  Administered 2022-05-17: 2 g via INTRAVENOUS
  Filled 2022-05-17: qty 50

## 2022-05-17 MED ORDER — INSULIN ASPART 100 UNIT/ML IJ SOLN
0.0000 [IU] | Freq: Three times a day (TID) | INTRAMUSCULAR | Status: DC
  Administered 2022-05-18: 3 [IU] via SUBCUTANEOUS

## 2022-05-17 MED ORDER — INSULIN ASPART 100 UNIT/ML IJ SOLN: 0.0000 [IU] | Freq: Every day | INTRAMUSCULAR | Status: DC

## 2022-05-17 MED ORDER — POTASSIUM CHLORIDE CRYS ER 20 MEQ PO TBCR
40.0000 meq | EXTENDED_RELEASE_TABLET | Freq: Once | ORAL | Status: AC
  Administered 2022-05-17: 40 meq via ORAL
  Filled 2022-05-17: qty 2

## 2022-05-17 MED ORDER — POTASSIUM CHLORIDE 20 MEQ PO PACK
60.0000 meq | PACK | Freq: Two times a day (BID) | ORAL | Status: DC
  Administered 2022-05-17: 60 meq via ORAL
  Filled 2022-05-17 (×2): qty 3

## 2022-05-17 MED ORDER — INSULIN GLARGINE-YFGN 100 UNIT/ML ~~LOC~~ SOLN
20.0000 [IU] | Freq: Every day | SUBCUTANEOUS | Status: DC
  Administered 2022-05-17 – 2022-05-18 (×2): 20 [IU] via SUBCUTANEOUS
  Filled 2022-05-17 (×2): qty 0.2

## 2022-05-17 NOTE — TOC Initial Note (Signed)
Transition of Care [facility]) - Initial/Assessment Note    Patient Details  Name: Dawn [name] MRN: [id number] Date of Birth: 11-05-2000  Transition of Care [facility]) CM/SW Contact:    Harriet Masson, RN Phone Number: 05/17/2022, 1:45 PM  Clinical Narrative:                  Attempted to speak to patient at bedside but patient was too lethargic to speak.  Called, [name], aunt. [name] states patient lives with a friend. Patient follows up with PCP.  Family takes patient to apts and to get prescriptions.  Patient takes an Benedetto Goad to work.  [name] can transport home at discharge.  TOC will continue to follow for needs.  Expected Discharge Plan: Home/Self Care Barriers to Discharge: Continued Medical Work up   Patient Goals and CMS Choice            Expected Discharge Plan and Services       Living arrangements for the past 2 months: Single Family Home                                      Prior Living Arrangements/Services Living arrangements for the past 2 months: Single Family Home Lives with:: Friends Patient language and need for interpreter reviewed:: Yes        Need for Family Participation in Patient Care: Yes (Comment) Care giver support system in place?: Yes (comment)   Criminal Activity/Legal Involvement Pertinent to Current Situation/Hospitalization: No - Comment as needed  Activities of Daily Living Home Assistive Devices/Equipment: CBG Meter ADL Screening (condition at time of admission) Patient's cognitive ability adequate to safely complete daily activities?: Yes Is the patient deaf or have difficulty hearing?: No Does the patient have difficulty seeing, even when wearing glasses/contacts?: No Does the patient have difficulty concentrating, remembering, or making decisions?: No Patient able to express need for assistance with ADLs?: Yes Does the patient have difficulty dressing or bathing?: No Independently performs ADLs?: Yes (appropriate for  developmental age) Does the patient have difficulty walking or climbing stairs?: No Weakness of Legs: None Weakness of Arms/Hands: None  Permission Sought/Granted                  Emotional Assessment         Alcohol / Substance Use: Not Applicable    Admission diagnosis:  DKA (diabetic ketoacidosis) (HCC) [E11.10] Patient Active Problem List   Diagnosis Date Noted   Hyperkalemia 05/15/2022   Pyelonephritis 03/28/2022   Sepsis (HCC) 03/28/2022   Transient hypotension 03/28/2022   AKI (acute kidney injury) (HCC) 10/17/2021   Leukocytosis 10/17/2021   DKA (diabetic ketoacidosis) (HCC) 09/01/2021   Epigastric pain 09/01/2021   Housing insecurity 08/09/2021   Menstrual periods irregular 06/17/2021   Chest wall pain    Diabetic ketoacidosis without coma associated with type 1 diabetes mellitus (HCC) 04/10/2021   Acute pyelonephritis 04/07/2021   Other specified disorders of kidney and ureter 03/17/2021   Encounter for counseling before starting and about pre-exposure prophylaxis for HIV 01/25/2021   DKA, type 1 (HCC) 01/03/2021   Nausea and vomiting 11/26/2020   Major depressive disorder    Viral URI 08/27/2020   Leg swelling 08/19/2020   Unprotected sexual intercourse 06/05/2020   Lower abdominal pain 12/24/2019   Tension headache, chronic 12/24/2019   Hx of migraines 07/11/2019   Protein-calorie malnutrition, severe 06/25/2019   Asthma 09/25/2018  Hyperglycemia    Noncompliance with diabetes treatment 07/04/2017   MDD (major depressive disorder), recurrent episode, moderate (HCC) 03/16/2016   Depression 03/24/2015   Maladaptive health behaviors affecting medical condition 03/24/2015   Poor social situation 07/20/2013   Poorly controlled type 1 diabetes mellitus (HCC) 07/07/2013   Hypokalemia 07/07/2013   PCP:  Nadyne Coombes, MD Pharmacy:   [facility] [phone] - , [place] - [address] AT [facility] [address] & [address] [place] [address] Phone: [phone] Fax: [phone]  [facility] [place], [place] - [address] [place] [address] Phone: [phone] Fax: [phone]  [facility] [address] [place] [address] Phone: [phone] Fax: [phone]     Social Determinants of Health (SDOH) Social History: SDOH Screenings   Food Insecurity: No Food Insecurity (05/15/2022)  Recent Concern: Food Insecurity - Food Insecurity Present (03/06/2022)  Housing: Low Risk  (05/16/2022)  Transportation Needs: No Transportation Needs (05/15/2022)  Utilities: Not At Risk (05/15/2022)  Depression (PHQ2-9): Low Risk  (01/06/2022)  Financial Resource Strain: High Risk (08/16/2021)  Physical Activity: Unknown (07/04/2018)  Social Connections: Unknown (07/04/2018)  Stress: Unknown (07/04/2018)  Tobacco Use: Medium Risk (05/15/2022)   SDOH Interventions:     Readmission Risk Interventions    03/07/2022   10:22 AM 12/19/2019    1:16 PM  Readmission Risk Prevention Plan  Transportation Screening Complete Complete  PCP or Specialist Appt within 3-5 Days  Complete  HRI or Home Care Consult  Complete  Social Work Consult for Recovery Care Planning/Counseling  Complete  Palliative Care Screening  Not Applicable  Medication Review Oceanographer) Complete Complete  PCP or Specialist appointment within 3-5 days of discharge Complete   HRI or Home Care Consult Complete   SW Recovery Care/Counseling Consult Complete   Palliative Care Screening Not Applicable   Skilled Nursing Facility Not Applicable

## 2022-05-17 NOTE — Plan of Care (Signed)
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
## 2022-05-17 NOTE — Progress Notes (Addendum)
On shift assessment patient complaining of extreme pain at iv site where iv potassium is infusing. Iv site assessed iv site flushes but does not draw back,  several attempts made and offered to infuse the medication via y-site, piggyback, through other iv site and by slowing the infusion rate by myself and day shift RN Doy Mince. Patient requesting medication be stopped and oral medication be provided or ivs would be removed. Iv potassium stopped, warm compress provided for comfort, pain medication given and Elink notified.   Patient refusing 2200 dose of subq heparin, per [facility] also refused on day shift. Elink aware.

## 2022-05-17 NOTE — Inpatient Diabetes Management (Signed)
Inpatient Diabetes Program Recommendations  AACE/ADA: New Consensus Statement on Inpatient Glycemic Control (2015)  Target Ranges:  Prepandial:   less than 140 mg/dL      Peak postprandial:   less than 180 mg/dL (1-2 hours)      Critically ill patients:  140 - 180 mg/dL   Lab Results  Component Value Date   GLUCAP 90 05/17/2022   HGBA1C 14.3 (H) 05/15/2022    Latest Reference Range & Units Most Recent  Sodium 135 - 145 mmol/L 141 05/17/22 04:12  Potassium 3.5 - 5.1 mmol/L 2.5 (LL) 05/17/22 04:12  Chloride 98 - 111 mmol/L 111 05/17/22 04:12  CO2 22 - 32 mmol/L 20 (L) 05/17/22 04:12  Glucose 70 - 99 mg/dL 161 (H) 0/96/04 54:09  Mean Plasma Glucose mg/dL 811.91 4/78/29 56:21  BUN 6 - 20 mg/dL 6 03/11/63 78:46  Creatinine 0.44 - 1.00 mg/dL 9.62 9/52/84 13:24  Calcium 8.9 - 10.3 mg/dL 8.7 (L) 04/03/00 72:53  Anion gap 5 - 15  10 05/17/22 04:12  BUN/Creatinine Ratio 9 - 23  25 (H) 08/09/21 11:43  eGFR >59 mL/min/1.73 101 08/09/21 11:43  (LL): Data is critically low (L): Data is abnormally low (H): Data is abnormally high  Diabetes history: DM 1 (requires basal bolus insulin to correct glucose levels and cover carbohydrates consumed) Outpatient Diabetes medications: [name] 40 units Daily, Humalog 0-8 units tid Current orders for Inpatient glycemic control: Semglee 20 units qd, Novolog 0-15 units tid, 0-5 units hs  Inpatient Diabetes Program Recommendations:   Please consider: -Decrease Novolog correction to 0-9 units tid, 0-5 units hs -Add Novolog 3 units tid meal coverage if PP CBGs >180  Spoke with patient @ bedside regarding taking basal insulin even when she is feeling sick to prevent DKA. Patient limited speaking and says she is too sleepy to talk @ this time. Will follow while hospitalized.  Thank you, Billy Fischer. Valentine Kuechle, RN, MSN, CDE  Diabetes Coordinator Inpatient Glycemic Control Team Team Pager [phone] (8am-5pm) 05/17/2022 11:39 AM

## 2022-05-18 LAB — MAGNESIUM: Magnesium: 2.4 mg/dL (ref 1.7–2.4)

## 2022-05-18 LAB — BASIC METABOLIC PANEL
Anion gap: 11 (ref 5–15)
Anion gap: 10 (ref 5–15)
BUN: <5 mg/dL — ABNORMAL LOW (ref 6–20)
BUN: <5 mg/dL — ABNORMAL LOW (ref 6–20)
CO2: 23 mmol/L (ref 22–32)
CO2: 23 mmol/L (ref 22–32)
Calcium: 8.6 mg/dL — ABNORMAL LOW (ref 8.9–10.3)
Calcium: 8.6 mg/dL — ABNORMAL LOW (ref 8.9–10.3)
Chloride: 104 mmol/L (ref 98–111)
Chloride: 106 mmol/L (ref 98–111)
Creatinine, Ser: 0.62 mg/dL (ref 0.44–1.00)
Creatinine, Ser: 0.61 mg/dL (ref 0.44–1.00)
GFR, Estimated: >60 mL/min (ref >60)
GFR, Estimated: >60 mL/min (ref >60)
Glucose, Bld: 159 mg/dL — ABNORMAL HIGH (ref 70–99)
Glucose, Bld: 150 mg/dL — ABNORMAL HIGH (ref 70–99)
Potassium: 3.7 mmol/L (ref 3.5–5.1)
Potassium: 3.6 mmol/L (ref 3.5–5.1)
Sodium: 138 mmol/L (ref 135–145)
Sodium: 139 mmol/L (ref 135–145)

## 2022-05-18 LAB — GLUCOSE, CAPILLARY
Glucose-Capillary: 199 mg/dL — ABNORMAL HIGH (ref 70–99)
Glucose-Capillary: 96 mg/dL (ref 70–99)

## 2022-05-18 LAB — CULTURE, BLOOD (ROUTINE X 2): Culture: NO GROWTH

## 2022-05-18 LAB — PHOSPHORUS: Phosphorus: 1.2 mg/dL — ABNORMAL LOW (ref 2.5–4.6)

## 2022-05-18 MED ORDER — POTASSIUM PHOSPHATES 15 MMOLE/5ML IV SOLN
45.0000 mmol | Freq: Once | INTRAVENOUS | Status: DC
  Filled 2022-05-18: qty 15

## 2022-05-18 MED ORDER — POTASSIUM & SODIUM PHOSPHATES 280-160-250 MG PO PACK
2.0000 | PACK | Freq: Once | ORAL | Status: AC
  Administered 2022-05-18: 2 via ORAL
  Filled 2022-05-18: qty 2

## 2022-05-18 MED ORDER — BD PEN NEEDLE NANO 2ND GEN 32G X 4 MM MISC
1 refills | Status: DC
Start: 2022-05-18 — End: 2022-08-05

## 2022-05-18 MED ORDER — INSULIN ASPART (W/NIACINAMIDE) 100 UNIT/ML ~~LOC~~ SOPN: 0.0000 [IU] | PEN_INJECTOR | Freq: Four times a day (QID) | SUBCUTANEOUS | 0 refills | Status: DC
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
## 2022-05-18 NOTE — Discharge Summary (Signed)
Physician Discharge Summary  Patient ID: Dawn [name] MRN: [id number] DOB/AGE: [birth year] 21 y.o.  Admit date: 05/15/2022 Discharge date: 05/18/2022  Admission Diagnoses: Severe DKA  Discharge Diagnoses:  Active Problems:   DKA (diabetic ketoacidosis) (HCC)   Hyperkalemia Poorly controlled type I DM Hypokalemia Hypomagnesemia Nausea and vomiting AKI Hypernatremia due to dehydration ADHD History of depression History of migraine History of asthma without acute exacerbation hypophosphatemia Poor social support  Discharged Condition: stable  Hospital Course: [name] was admitted to the hospital and started on empiric antibiotics and treatment for DKA.  Due to difficulty with IV access she had a delayed start to DKA treatment.  She had refractory metabolic acidosis and ketosis requiring higher dose insulin and high-dose dextrose to support her.  She was able to be weaned off IV insulin and transitioned back to basal bolus subcu insulin.  We had a very frank discussion about her poor level of control and the likely long-term consequences that we will happen to her including blindness, severe infections, renal failure requiring dialysis if her diabetes control does not improve dramatically.  She has not been taking any sliding scale insulin and only sometimes uses meal coverage.  She sometimes misses her long-acting.  She missed her last appointment with her endocrinologist and was not able to be rescheduled until July.  She was supposed to be started on an insulin infusion at that time.  She has a Dexcom.  She has a primary care doctor at [facility]- Dr. Theodoro Doing.  She has been requested to follow-up with them within 1 week of discharge.  We discussed that we will discharge her on a sliding scale.  She needs to keep track of her blood sugars and the amount of insulin she is taking to help with outpatient titrations.  She needs to more consistently eat 3 meals a day and take insulin on a regular  basis.  Given that this is her fifth admission in 2024 with DKA, I anticipate that we will see her again unfortunately.  Diabetes educators saw her the day of discharge.  Consults:  Diabetes coordinator  Significant Diagnostic Studies:  Respiratory viral panel negative Blood cultures-no growth at 3 days Urine culture no growth, final CT abdomen pelvis: IMPRESSION: 1. Marked distention of the bladder with trace bilateral hydroureteronephrosis. No obstructing calculi are seen. Findings may be related to bladder distension. 2. Moderate air-fluid level in the stomach. 3. Patchy ground-glass opacities in both lung bases may be infectious/inflammatory. Imaging features can be seen with (COVID-19) pneumonia, though are nonspecific and can occur with a variety of infectious and noninfectious processes.   Treatments:  Empiric antibiotics, taken off 1 day into admission IV insulin Antiemetics Fluids   Discharge Exam: Blood pressure 116/80, pulse 88, temperature 98 F (36.7 C), temperature source Oral, resp. rate 15, height 5\' 2"  (1.575 m), weight 49.4 kg, SpO2 99 %. Young woman lying in bed no acute distress [place]/AT, eyes anicteric Breathing comfortably on room air, CTAB. S1-S2, regular rate rhythm Abdomen soft, nontender, nondistended No peripheral edema, no clubbing or cyanosis No diffuse rashes Awake alert, answering questions appropriately, normal speech.  Moving all extremities.  Disposition: Home   Allergies as of 05/18/2022       Reactions   Latex Itching, Swelling, Other (See Comments)   Skin irritation   Tape Other (See Comments)   Skin irritation   Shellfish Allergy Itching, Rash   Scallops specifically        Medication List  TAKE these medications    Accu-Chek Guide w/Device Kit Use 3 (three) times daily before meals.   Baqsimi Two Pack 3 MG/DOSE Powd Generic drug: Glucagon Place 3 mg into the nose as needed (low blood sugar).   BD Pen Needle Nano  2nd Gen 32G X 4 MM Misc Generic drug: Insulin Pen Needle USE TO INJECT INSULIN 6 TIMES DAILY   Dexcom G7 Sensor Misc 1 Application by Does not apply route as directed.   Fiasp FlexTouch 100 UNIT/ML FlexTouch Pen Generic drug: insulin aspart Inject 10 Units into the skin in the morning, at noon, and at bedtime. Up to 3 times a day with meals What changed: Another medication with the same name was added. Make sure you understand how and when to take each.   insulin aspart 100 UNIT/ML FlexTouch Pen Commonly known as: FIASP Inject 0-15 Units into the skin 4 (four) times daily. CBG 70 - 120: 0 units  CBG 121 - 150: 2 units  CBG 151 - 200: 3 units  CBG 201 - 250: 5 units  CBG 251 - 300: 8 units  CBG 301 - 350: 11 units  CBG 351 - 400: 15 units  Use in addition to mealtime insulin to cover the meal. What changed: You were already taking a medication with the same name, and this prescription was added. Make sure you understand how and when to take each.   ibuprofen 600 MG tablet Commonly known as: ADVIL Take 1 tablet (600 mg total) by mouth every 6 (six) hours as needed for moderate pain or mild pain.   PRENATAL PO Take 2 each by mouth daily. Prenatal gummies   sertraline 25 MG tablet Commonly known as: ZOLOFT Take 3 tablets (75 mg total) by mouth daily.   SUMAtriptan 25 MG tablet Commonly known as: Imitrex Take 0.5 tablets (12.5 mg total) by mouth every 2 (two) hours as needed for migraine. May repeat in 2 hours if headache persists or recurs.   tiZANidine 4 MG tablet Commonly known as: ZANAFLEX Take 4 mg by mouth every 8 (eight) hours as needed for muscle spasms.   [name] FlexTouch 100 UNIT/ML FlexTouch Pen Generic drug: insulin degludec Inject 30 Units into the skin daily.       New pen needles e- prescribed. Printed script for her sliding scale. She reports she had enough insulin at home.  Signed: Steffanie Dunn 05/18/2022, 11:38 AM

## 2022-05-18 NOTE — TOC Progression Note (Signed)
Transition of Care [facility]) - Progression Note    Patient Details  Name: Dawn [name] MRN: [id number] Date of Birth: [birth year]  Transition of Care [facility]) CM/SW Contact  Harriet Masson, RN Phone Number: 05/18/2022, 11:47 AM  Clinical Narrative:     Shanda Bumps [place] block left VM with return number [phone]. On VM Shanda Bumps stated patient is followed by [facility] block. This RNCM left VM for Shanda Bumps to return call.   Expected Discharge Plan: Home/Self Care Barriers to Discharge: Continued Medical Work up  Expected Discharge Plan and Services       Living arrangements for the past 2 months: Single Family Home                                       Social Determinants of Health (SDOH) Interventions SDOH Screenings   Food Insecurity: No Food Insecurity (05/15/2022)  Recent Concern: Food Insecurity - Food Insecurity Present (03/06/2022)  Housing: Low Risk  (05/16/2022)  Transportation Needs: No Transportation Needs (05/15/2022)  Utilities: Not At Risk (05/15/2022)  Depression (PHQ2-9): Low Risk  (01/06/2022)  Financial Resource Strain: High Risk (08/16/2021)  Physical Activity: Unknown (07/04/2018)  Social Connections: Unknown (07/04/2018)  Stress: Unknown (07/04/2018)  Tobacco Use: Medium Risk (05/15/2022)    Readmission Risk Interventions    03/07/2022   10:22 AM 12/19/2019    1:16 PM  Readmission Risk Prevention Plan  Transportation Screening Complete Complete  PCP or Specialist Appt within 3-5 Days  Complete  HRI or Home Care Consult  Complete  Social Work Consult for Recovery Care Planning/Counseling  Complete  Palliative Care Screening  Not Applicable  Medication Review Oceanographer) Complete Complete  PCP or Specialist appointment within 3-5 days of discharge Complete   HRI or Home Care Consult Complete   SW Recovery Care/Counseling Consult Complete   Palliative Care Screening Not Applicable   Skilled Nursing Facility Not Applicable

## 2022-05-18 NOTE — Progress Notes (Signed)
Pharmacy Electrolyte Replacement  Recent Labs:  Recent Labs    05/18/22 0407  K 3.7  MG 2.4  PHOS 1.2*  CREATININE 0.62    Low Critical Values (K </= 2.5, Phos </= 1, Mg </= 1) Present: None  Plan: Replete with Kphos.   Estill Batten, PharmD, BCCCP

## 2022-05-18 NOTE — Plan of Care (Signed)
Problem: Education: Goal: Ability to describe self-care measures that may prevent or decrease complications (Diabetes Survival Skills Education) will improve Outcome: Adequate for Discharge Goal: Individualized Educational Video(s) Outcome: Adequate for Discharge   Problem: Coping: Goal: Ability to adjust to condition or change in health will improve Outcome: Adequate for Discharge   Problem: Fluid Volume: Goal: Ability to maintain a balanced intake and output will improve Outcome: Adequate for Discharge   Problem: Health Behavior/Discharge Planning: Goal: Ability to identify and utilize available resources and services will improve Outcome: Adequate for Discharge Goal: Ability to manage health-related needs will improve Outcome: Adequate for Discharge   Problem: Metabolic: Goal: Ability to maintain appropriate glucose levels will improve Outcome: Adequate for Discharge   Problem: Nutritional: Goal: Maintenance of adequate nutrition will improve Outcome: Adequate for Discharge Goal: Progress toward achieving an optimal weight will improve Outcome: Adequate for Discharge   Problem: Skin Integrity: Goal: Risk for impaired skin integrity will decrease Outcome: Adequate for Discharge   Problem: Tissue Perfusion: Goal: Adequacy of tissue perfusion will improve Outcome: Adequate for Discharge   Problem: Education: Goal: Ability to describe self-care measures that may prevent or decrease complications (Diabetes Survival Skills Education) will improve Outcome: Adequate for Discharge Goal: Individualized Educational Video(s) Outcome: Adequate for Discharge   Problem: Cardiac: Goal: Ability to maintain an adequate cardiac output will improve Outcome: Adequate for Discharge   Problem: Health Behavior/Discharge Planning: Goal: Ability to identify and utilize available resources and services will improve Outcome: Adequate for Discharge Goal: Ability to manage health-related  needs will improve Outcome: Adequate for Discharge   Problem: Fluid Volume: Goal: Ability to achieve a balanced intake and output will improve Outcome: Adequate for Discharge   Problem: Metabolic: Goal: Ability to maintain appropriate glucose levels will improve Outcome: Adequate for Discharge   Problem: Nutritional: Goal: Maintenance of adequate nutrition will improve Outcome: Adequate for Discharge Goal: Maintenance of adequate weight for body size and type will improve Outcome: Adequate for Discharge   Problem: Respiratory: Goal: Will regain and/or maintain adequate ventilation Outcome: Adequate for Discharge   Problem: Urinary Elimination: Goal: Ability to achieve and maintain adequate renal perfusion and functioning will improve Outcome: Adequate for Discharge   Problem: Education: Goal: Knowledge of General Education information will improve Description: Including pain rating scale, medication(s)/side effects and non-pharmacologic comfort measures Outcome: Adequate for Discharge   Problem: Health Behavior/Discharge Planning: Goal: Ability to manage health-related needs will improve Outcome: Adequate for Discharge   Problem: Clinical Measurements: Goal: Ability to maintain clinical measurements within normal limits will improve Outcome: Adequate for Discharge Goal: Will remain free from infection Outcome: Adequate for Discharge Goal: Diagnostic test results will improve Outcome: Adequate for Discharge Goal: Respiratory complications will improve Outcome: Adequate for Discharge Goal: Cardiovascular complication will be avoided Outcome: Adequate for Discharge   Problem: Activity: Goal: Risk for activity intolerance will decrease Outcome: Adequate for Discharge   Problem: Nutrition: Goal: Adequate nutrition will be maintained Outcome: Adequate for Discharge   Problem: Coping: Goal: Level of anxiety will decrease Outcome: Adequate for Discharge   Problem:  Elimination: Goal: Will not experience complications related to bowel motility Outcome: Adequate for Discharge Goal: Will not experience complications related to urinary retention Outcome: Adequate for Discharge   Problem: Pain Managment: Goal: General experience of comfort will improve Outcome: Adequate for Discharge   Problem: Safety: Goal: Ability to remain free from injury will improve Outcome: Adequate for Discharge   Problem: Skin Integrity: Goal: Risk for impaired skin integrity will decrease   Outcome: Adequate for Discharge   

## 2022-05-20 LAB — CULTURE, BLOOD (ROUTINE X 2)
Special Requests: ADEQUATE
Special Requests: ADEQUATE

## 2022-05-21 DIAGNOSIS — R0689 Other abnormalities of breathing: Secondary | ICD-10-CM | POA: Diagnosis not present

## 2022-05-21 DIAGNOSIS — R1084 Generalized abdominal pain: Secondary | ICD-10-CM | POA: Diagnosis not present

## 2022-05-21 DIAGNOSIS — E1010 Type 1 diabetes mellitus with ketoacidosis without coma: Secondary | ICD-10-CM | POA: Diagnosis not present

## 2022-05-21 DIAGNOSIS — R739 Hyperglycemia, unspecified: Secondary | ICD-10-CM | POA: Diagnosis not present

## 2022-05-25 DIAGNOSIS — E1065 Type 1 diabetes mellitus with hyperglycemia: Secondary | ICD-10-CM | POA: Diagnosis not present

## 2022-05-25 DIAGNOSIS — Z113 Encounter for screening for infections with a predominantly sexual mode of transmission: Secondary | ICD-10-CM | POA: Diagnosis not present

## 2022-06-08 DIAGNOSIS — E1065 Type 1 diabetes mellitus with hyperglycemia: Secondary | ICD-10-CM | POA: Diagnosis not present

## 2022-06-08 DIAGNOSIS — R87612 Low grade squamous intraepithelial lesion on cytologic smear of cervix (LGSIL): Secondary | ICD-10-CM | POA: Diagnosis not present

## 2022-06-08 DIAGNOSIS — Z124 Encounter for screening for malignant neoplasm of cervix: Secondary | ICD-10-CM | POA: Diagnosis not present

## 2022-06-16 LAB — HM PAP SMEAR

## 2022-06-25 DIAGNOSIS — E860 Dehydration: Secondary | ICD-10-CM | POA: Diagnosis not present

## 2022-06-25 DIAGNOSIS — D72829 Elevated white blood cell count, unspecified: Secondary | ICD-10-CM | POA: Diagnosis not present

## 2022-06-25 DIAGNOSIS — E272 Addisonian crisis: Secondary | ICD-10-CM | POA: Diagnosis not present

## 2022-06-25 DIAGNOSIS — Z91148 Patient's other noncompliance with medication regimen for other reason: Secondary | ICD-10-CM | POA: Diagnosis not present

## 2022-06-25 DIAGNOSIS — F909 Attention-deficit hyperactivity disorder, unspecified type: Secondary | ICD-10-CM | POA: Diagnosis not present

## 2022-06-25 DIAGNOSIS — R000 Tachycardia, unspecified: Secondary | ICD-10-CM | POA: Diagnosis not present

## 2022-06-25 DIAGNOSIS — Z794 Long term (current) use of insulin: Secondary | ICD-10-CM | POA: Diagnosis not present

## 2022-06-25 DIAGNOSIS — E1010 Type 1 diabetes mellitus with ketoacidosis without coma: Secondary | ICD-10-CM | POA: Diagnosis not present

## 2022-06-25 DIAGNOSIS — E875 Hyperkalemia: Secondary | ICD-10-CM | POA: Diagnosis not present

## 2022-06-25 DIAGNOSIS — Z9641 Presence of insulin pump (external) (internal): Secondary | ICD-10-CM | POA: Diagnosis not present

## 2022-07-14 ENCOUNTER — Encounter (HOSPITAL_COMMUNITY): Payer: Self-pay

## 2022-07-14 ENCOUNTER — Inpatient Hospital Stay (HOSPITAL_COMMUNITY)
Admission: EM | Admit: 2022-07-14 | Discharge: 2022-07-16 | DRG: 638 | Disposition: A | Discharge status: Home / Self Care | Payer: 59 | Attending: Internal Medicine | Admitting: Internal Medicine

## 2022-07-14 ENCOUNTER — Other Ambulatory Visit: Payer: Self-pay

## 2022-07-14 DIAGNOSIS — Z79899 Other long term (current) drug therapy: Secondary | ICD-10-CM

## 2022-07-14 DIAGNOSIS — F4321 Adjustment disorder with depressed mood: Secondary | ICD-10-CM | POA: Diagnosis present

## 2022-07-14 DIAGNOSIS — F909 Attention-deficit hyperactivity disorder, unspecified type: Secondary | ICD-10-CM | POA: Diagnosis present

## 2022-07-14 DIAGNOSIS — Z833 Family history of diabetes mellitus: Secondary | ICD-10-CM

## 2022-07-14 DIAGNOSIS — Z794 Long term (current) use of insulin: Secondary | ICD-10-CM

## 2022-07-14 DIAGNOSIS — E1065 Type 1 diabetes mellitus with hyperglycemia: Secondary | ICD-10-CM | POA: Diagnosis present

## 2022-07-14 DIAGNOSIS — E1110 Type 2 diabetes mellitus with ketoacidosis without coma: Secondary | ICD-10-CM | POA: Diagnosis present

## 2022-07-14 DIAGNOSIS — E1010 Type 1 diabetes mellitus with ketoacidosis without coma: Principal | ICD-10-CM | POA: Diagnosis present

## 2022-07-14 DIAGNOSIS — Z91013 Allergy to seafood: Secondary | ICD-10-CM

## 2022-07-14 DIAGNOSIS — Z7722 Contact with and (suspected) exposure to environmental tobacco smoke (acute) (chronic): Secondary | ICD-10-CM | POA: Diagnosis present

## 2022-07-14 DIAGNOSIS — Z91199 Patient's noncompliance with other medical treatment and regimen due to unspecified reason: Secondary | ICD-10-CM

## 2022-07-14 DIAGNOSIS — Z9641 Presence of insulin pump (external) (internal): Secondary | ICD-10-CM | POA: Diagnosis present

## 2022-07-14 DIAGNOSIS — R519 Headache, unspecified: Secondary | ICD-10-CM | POA: Diagnosis not present

## 2022-07-14 DIAGNOSIS — R079 Chest pain, unspecified: Secondary | ICD-10-CM | POA: Diagnosis present

## 2022-07-14 DIAGNOSIS — N179 Acute kidney failure, unspecified: Secondary | ICD-10-CM | POA: Diagnosis present

## 2022-07-14 DIAGNOSIS — Z91048 Other nonmedicinal substance allergy status: Secondary | ICD-10-CM

## 2022-07-14 DIAGNOSIS — Z91040 Latex allergy status: Secondary | ICD-10-CM

## 2022-07-14 DIAGNOSIS — R000 Tachycardia, unspecified: Secondary | ICD-10-CM | POA: Diagnosis not present

## 2022-07-14 LAB — BASIC METABOLIC PANEL
Anion gap: 16 — ABNORMAL HIGH (ref 5–15)
Anion gap: 16 — ABNORMAL HIGH (ref 5–15)
BUN: 24 mg/dL — ABNORMAL HIGH (ref 6–20)
BUN: 17 mg/dL (ref 6–20)
BUN: 15 mg/dL (ref 6–20)
CO2: <7 mmol/L — ABNORMAL LOW (ref 22–32)
CO2: 9 mmol/L — ABNORMAL LOW (ref 22–32)
CO2: 10 mmol/L — ABNORMAL LOW (ref 22–32)
Calcium: 9.8 mg/dL (ref 8.9–10.3)
Calcium: 9 mg/dL (ref 8.9–10.3)
Calcium: 9.2 mg/dL (ref 8.9–10.3)
Chloride: 100 mmol/L (ref 98–111)
Chloride: 113 mmol/L — ABNORMAL HIGH (ref 98–111)
Chloride: 112 mmol/L — ABNORMAL HIGH (ref 98–111)
Creatinine, Ser: 1.53 mg/dL — ABNORMAL HIGH (ref 0.44–1.00)
Creatinine, Ser: 1.18 mg/dL — ABNORMAL HIGH (ref 0.44–1.00)
Creatinine, Ser: 1.11 mg/dL — ABNORMAL HIGH (ref 0.44–1.00)
GFR, Estimated: 49 mL/min — ABNORMAL LOW (ref >60)
GFR, Estimated: >60 mL/min (ref >60)
GFR, Estimated: >60 mL/min (ref >60)
Glucose, Bld: 747 mg/dL (ref 70–99)
Glucose, Bld: 166 mg/dL — ABNORMAL HIGH (ref 70–99)
Glucose, Bld: 146 mg/dL — ABNORMAL HIGH (ref 70–99)
Potassium: 5.3 mmol/L — ABNORMAL HIGH (ref 3.5–5.1)
Potassium: 4.4 mmol/L (ref 3.5–5.1)
Potassium: 4.7 mmol/L (ref 3.5–5.1)
Sodium: 135 mmol/L (ref 135–145)
Sodium: 138 mmol/L (ref 135–145)
Sodium: 138 mmol/L (ref 135–145)

## 2022-07-14 LAB — I-STAT VENOUS BLOOD GAS, ED
Acid-base deficit: 22 mmol/L — ABNORMAL HIGH (ref 0.0–2.0)
Bicarbonate: 6.2 mmol/L — ABNORMAL LOW (ref 20.0–28.0)
Calcium, Ion: 1.23 mmol/L (ref 1.15–1.40)
HCT: 42 % (ref 36.0–46.0)
Hemoglobin: 14.3 g/dL (ref 12.0–15.0)
O2 Saturation: 72 %
Potassium: 5.4 mmol/L — ABNORMAL HIGH (ref 3.5–5.1)
Sodium: 134 mmol/L — ABNORMAL LOW (ref 135–145)
TCO2: 7 mmol/L — ABNORMAL LOW (ref 22–32)
pCO2, Ven: 21 mmHg — ABNORMAL LOW (ref 44–60)
pH, Ven: 7.076 — CL (ref 7.25–7.43)
pO2, Ven: 51 mmHg — ABNORMAL HIGH (ref 32–45)

## 2022-07-14 LAB — URINALYSIS, ROUTINE W REFLEX MICROSCOPIC
Bacteria, UA: NONE SEEN
Bilirubin Urine: NEGATIVE
Glucose, UA: >=500 mg/dL — AB
Ketones, ur: 80 mg/dL — AB
Leukocytes,Ua: NEGATIVE
Nitrite: NEGATIVE
Protein, ur: NEGATIVE mg/dL
Specific Gravity, Urine: 1.02 (ref 1.005–1.030)
pH: 5 (ref 5.0–8.0)

## 2022-07-14 LAB — GLUCOSE, CAPILLARY
Glucose-Capillary: 338 mg/dL — ABNORMAL HIGH (ref 70–99)
Glucose-Capillary: 291 mg/dL — ABNORMAL HIGH (ref 70–99)
Glucose-Capillary: 190 mg/dL — ABNORMAL HIGH (ref 70–99)
Glucose-Capillary: 123 mg/dL — ABNORMAL HIGH (ref 70–99)
Glucose-Capillary: 171 mg/dL — ABNORMAL HIGH (ref 70–99)

## 2022-07-14 LAB — CBC WITH DIFFERENTIAL/PLATELET
Abs Immature Granulocytes: 0.08 10*3/uL — ABNORMAL HIGH (ref 0.00–0.07)
Basophils Absolute: 0.1 10*3/uL (ref 0.0–0.1)
Basophils Relative: 1 %
Eosinophils Absolute: 0 10*3/uL (ref 0.0–0.5)
Eosinophils Relative: 0 %
HCT: 40.4 % (ref 36.0–46.0)
Hemoglobin: 12 g/dL (ref 12.0–15.0)
Immature Granulocytes: 1 %
Lymphocytes Relative: 19 %
Lymphs Abs: 2 10*3/uL (ref 0.7–4.0)
MCH: 26.8 pg (ref 26.0–34.0)
MCHC: 29.7 g/dL — ABNORMAL LOW (ref 30.0–36.0)
MCV: 90.2 fL (ref 80.0–100.0)
Monocytes Absolute: 0.3 10*3/uL (ref 0.1–1.0)
Monocytes Relative: 3 %
Neutro Abs: 8 10*3/uL — ABNORMAL HIGH (ref 1.7–7.7)
Neutrophils Relative %: 76 %
Platelets: 327 10*3/uL (ref 150–400)
RBC: 4.48 MIL/uL (ref 3.87–5.11)
RDW: 13.7 % (ref 11.5–15.5)
WBC: 10.5 10*3/uL (ref 4.0–10.5)
nRBC: 0 % (ref 0.0–0.2)

## 2022-07-14 LAB — BETA-HYDROXYBUTYRIC ACID
Beta-Hydroxybutyric Acid: >8 mmol/L — ABNORMAL HIGH (ref 0.05–0.27)
Beta-Hydroxybutyric Acid: 5.57 mmol/L — ABNORMAL HIGH (ref 0.05–0.27)

## 2022-07-14 LAB — CBG MONITORING, ED
Glucose-Capillary: >600 mg/dL (ref 70–99)
Glucose-Capillary: 557 mg/dL (ref 70–99)
Glucose-Capillary: >600 mg/dL (ref 70–99)
Glucose-Capillary: 423 mg/dL — ABNORMAL HIGH (ref 70–99)
Glucose-Capillary: 488 mg/dL — ABNORMAL HIGH (ref 70–99)
Glucose-Capillary: 354 mg/dL — ABNORMAL HIGH (ref 70–99)

## 2022-07-14 LAB — HCG, SERUM, QUALITATIVE: Preg, Serum: NEGATIVE

## 2022-07-14 LAB — HIV ANTIBODY (ROUTINE TESTING W REFLEX): HIV Screen 4th Generation wRfx: NONREACTIVE

## 2022-07-14 MED ORDER — HEPARIN SODIUM (PORCINE) 5000 UNIT/ML IJ SOLN: 5000.0000 [IU] | Freq: Three times a day (TID) | INTRAMUSCULAR | Status: DC

## 2022-07-14 MED ORDER — OXYCODONE HCL 5 MG PO TABS
5.0000 mg | ORAL_TABLET | ORAL | Status: DC | PRN
  Administered 2022-07-14 – 2022-07-16 (×5): 5 mg via ORAL
  Filled 2022-07-14 (×5): qty 1

## 2022-07-14 MED ORDER — LACTATED RINGERS IV SOLN: INTRAVENOUS | Status: DC

## 2022-07-14 MED ORDER — DEXTROSE 50 % IV SOLN: 0.0000 mL | INTRAVENOUS | Status: DC | PRN

## 2022-07-14 MED ORDER — ONDANSETRON HCL 4 MG/2ML IJ SOLN
4.0000 mg | Freq: Once | INTRAMUSCULAR | Status: AC
  Administered 2022-07-14: 4 mg via INTRAVENOUS
  Filled 2022-07-14: qty 2

## 2022-07-14 MED ORDER — SERTRALINE HCL 50 MG PO TABS
75.0000 mg | ORAL_TABLET | Freq: Every day | ORAL | Status: DC
  Administered 2022-07-14 – 2022-07-16 (×3): 75 mg via ORAL
  Filled 2022-07-14 (×3): qty 2

## 2022-07-14 MED ORDER — LACTATED RINGERS IV BOLUS: 20.0000 mL/kg | Freq: Once | INTRAVENOUS | Status: DC

## 2022-07-14 MED ORDER — INSULIN REGULAR(HUMAN) IN NACL 100-0.9 UT/100ML-% IV SOLN
INTRAVENOUS | Status: DC
  Administered 2022-07-14: 7.5 [IU]/h via INTRAVENOUS
  Filled 2022-07-14: qty 100

## 2022-07-14 MED ORDER — DEXTROSE IN LACTATED RINGERS 5 % IV SOLN: INTRAVENOUS | Status: DC

## 2022-07-14 MED ORDER — OXYCODONE-ACETAMINOPHEN 5-325 MG PO TABS
1.0000 | ORAL_TABLET | Freq: Once | ORAL | Status: AC
  Administered 2022-07-14: 1 via ORAL
  Filled 2022-07-14: qty 1

## 2022-07-14 MED ORDER — LACTATED RINGERS IV BOLUS
2000.0000 mL | Freq: Once | INTRAVENOUS | Status: AC
  Administered 2022-07-14: 2000 mL via INTRAVENOUS

## 2022-07-14 MED ORDER — INSULIN REGULAR(HUMAN) IN NACL 100-0.9 UT/100ML-% IV SOLN: INTRAVENOUS | Status: DC

## 2022-07-14 MED ORDER — ONDANSETRON HCL 4 MG/2ML IJ SOLN: 4.0000 mg | Freq: Three times a day (TID) | INTRAMUSCULAR | Status: DC | PRN
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
## 2022-07-14 NOTE — ED Notes (Signed)
Provided pt with bedside commode. Patient agreed to give urine sample.

## 2022-07-14 NOTE — ED Provider Notes (Signed)
[place] EMERGENCY DEPARTMENT AT [facility] Provider Note   CSN: [id number] Arrival date & time: 07/14/22  1402     History  Chief Complaint  Patient presents with   Hyperglycemia   Emesis    Dawn [name] is a 22 y.o. female with past medical history significant for type 1 diabetes, asthma, ADHD, migraines, DKA, adjustment disorder presents to the ED from home with nausea, vomiting, diarrhea and body aches.  Patient states she believes she is in DKA.  She states that she normally wears an insulin pump and yesterday was due for a site change.  Patient took off her insulin pump but did not replace it since she "felt pretty well".  Patient does have other insulin, but has not been using it.  Denies chest pain, shortness of breath, abdominal pain, fever.        Home Medications Prior to Admission medications   Medication Sig Start Date End Date Taking? Authorizing Provider  Blood Glucose Monitoring Suppl (ACCU-CHEK GUIDE ME) w/Device KIT Use 3 (three) times daily before meals. 02/18/22   Bess Kinds, MD  Continuous Glucose Sensor (DEXCOM G7 SENSOR) MISC 1 Application by Does not apply route as directed.    [provider]  Glucagon (BAQSIMI TWO PACK) 3 MG/DOSE POWD Place 3 mg into the nose as needed (low blood sugar).    [provider]  ibuprofen (ADVIL) 600 MG tablet Take 1 tablet (600 mg total) by mouth every 6 (six) hours as needed for moderate pain or mild pain. 04/01/22   Leroy Sea, MD  insulin aspart (FIASP FLEXTOUCH) 100 UNIT/ML FlexTouch Pen Inject 10 Units into the skin in the morning, at noon, and at bedtime. Up to 3 times a day with meals 03/08/22   Darlin Drop, DO  insulin aspart (FIASP) 100 UNIT/ML FlexTouch Pen Inject 0-15 Units into the skin 4 (four) times daily. CBG 70 - 120: 0 units  CBG 121 - 150: 2 units  CBG 151 - 200: 3 units  CBG 201 - 250: 5 units  CBG 251 - 300: 8 units  CBG 301 - 350: 11 units  CBG 351 - 400: 15  units  Use in addition to mealtime insulin to cover the meal. 05/18/22   Chestine Spore, Virl Axe, DO  Insulin Pen Needle (BD PEN NEEDLE NANO 2ND GEN) 32G X 4 MM MISC USE TO INJECT INSULIN 6 TIMES DAILY 05/18/22   Steffanie Dunn, DO  Prenatal Vit-Fe Fumarate-FA (PRENATAL PO) Take 2 each by mouth daily. Prenatal gummies    [provider]  sertraline (ZOLOFT) 25 MG tablet Take 3 tablets (75 mg total) by mouth daily. 09/03/21   Sabino Dick, DO  SUMAtriptan (IMITREX) 25 MG tablet Take 0.5 tablets (12.5 mg total) by mouth every 2 (two) hours as needed for migraine. May repeat in 2 hours if headache persists or recurs. 04/12/21   Dana Allan, MD  tiZANidine (ZANAFLEX) 4 MG tablet Take 4 mg by mouth every 8 (eight) hours as needed for muscle spasms.    [provider]  [name] FLEXTOUCH 100 UNIT/ML FlexTouch Pen Inject 30 Units into the skin daily. 11/09/21   [provider]      Allergies    Latex, Tape, and Shellfish allergy    Review of Systems   Review of Systems  Constitutional:  Negative for fever.  Respiratory:  Negative for shortness of breath.   Cardiovascular:  Negative for chest pain.  Gastrointestinal:  Positive for  diarrhea, nausea and vomiting. Negative for abdominal pain.  Musculoskeletal:  Positive for myalgias.    Physical Exam Updated Vital Signs BP 103/83   Pulse (!) 125   Temp 97.7 F (36.5 C) (Oral)   Resp (!) 27   Ht 5' (1.524 m)   Wt 49.4 kg   LMP 07/05/2022 (Approximate)   SpO2 100%   BMI 21.27 kg/m  Physical Exam Vitals and nursing note reviewed.  Constitutional:      General: She is not in acute distress.    Appearance: Normal appearance. She is ill-appearing. She is not diaphoretic.  HENT:     Mouth/Throat:     Mouth: Mucous membranes are dry.  Eyes:     Conjunctiva/sclera: Conjunctivae normal.     Pupils: Pupils are equal, round, and reactive to light.  Cardiovascular:     Rate and Rhythm: Regular rhythm. Tachycardia present.      Pulses: Normal pulses.     Heart sounds: Normal heart sounds.  Pulmonary:     Effort: Pulmonary effort is normal. Tachypnea present. No accessory muscle usage or respiratory distress.     Breath sounds: Normal breath sounds and air entry.     Comments: Patient speaking in full sentences. Abdominal:     General: Abdomen is flat.     Palpations: Abdomen is soft.     Tenderness: There is no abdominal tenderness.  Skin:    General: Skin is warm and dry.     Capillary Refill: Capillary refill takes less than 2 seconds.  Neurological:     Mental Status: She is alert and oriented to person, place, and time. Mental status is at baseline.     GCS: GCS eye subscore is 4. GCS verbal subscore is 5. GCS motor subscore is 6.  Psychiatric:        Mood and Affect: Mood normal.        Behavior: Behavior normal.     ED Results / Procedures / Treatments   Labs (all labs ordered are listed, but only abnormal results are displayed) Labs Reviewed  BASIC METABOLIC PANEL - Abnormal; Notable for the following components:      Result Value   Potassium 5.3 (*)    CO2 <7 (*)    Glucose, Bld 747 (*)    BUN 24 (*)    Creatinine, Ser 1.53 (*)    GFR, Estimated 49 (*)    All other components within normal limits  BETA-HYDROXYBUTYRIC ACID - Abnormal; Notable for the following components:   Beta-Hydroxybutyric Acid >8.00 (*)    All other components within normal limits  CBC WITH DIFFERENTIAL/PLATELET - Abnormal; Notable for the following components:   MCHC 29.7 (*)    Neutro Abs 8.0 (*)    Abs Immature Granulocytes 0.08 (*)    All other components within normal limits  URINALYSIS, ROUTINE W REFLEX MICROSCOPIC - Abnormal; Notable for the following components:   Color, Urine STRAW (*)    Glucose, UA >=500 (*)    Hgb urine dipstick MODERATE (*)    Ketones, ur 80 (*)    All other components within normal limits  CBG MONITORING, ED - Abnormal; Notable for the following components:   Glucose-Capillary  >600 (*)    All other components within normal limits  I-STAT VENOUS BLOOD GAS, ED - Abnormal; Notable for the following components:   pH, Ven 7.076 (*)    pCO2, Ven 21.0 (*)    pO2, Ven 51 (*)    Bicarbonate 6.2 (*)  TCO2 7 (*)    Acid-base deficit 22.0 (*)    Sodium 134 (*)    Potassium 5.4 (*)    All other components within normal limits  CBG MONITORING, ED - Abnormal; Notable for the following components:   Glucose-Capillary >600 (*)    All other components within normal limits  CBG MONITORING, ED - Abnormal; Notable for the following components:   Glucose-Capillary 557 (*)    All other components within normal limits  CBG MONITORING, ED - Abnormal; Notable for the following components:   Glucose-Capillary 488 (*)    All other components within normal limits  CBG MONITORING, ED - Abnormal; Notable for the following components:   Glucose-Capillary 423 (*)    All other components within normal limits  CBG MONITORING, ED - Abnormal; Notable for the following components:   Glucose-Capillary 354 (*)    All other components within normal limits  HCG, SERUM, QUALITATIVE  BASIC METABOLIC PANEL  BASIC METABOLIC PANEL  BASIC METABOLIC PANEL  BETA-HYDROXYBUTYRIC ACID  BASIC METABOLIC PANEL  BETA-HYDROXYBUTYRIC ACID    EKG EKG Interpretation Date/Time:  Thursday July 14 2022 16:19:07 EDT Ventricular Rate:  125 PR Interval:  119 QRS Duration:  74 QT Interval:  311 QTC Calculation: 449 R Axis:   76  Text Interpretation: Sinus tachycardia Consider right atrial enlargement Confirmed by Vonita Moss [phone]) on 07/14/2022 5:03:28 PM  Radiology No results found.  Procedures Procedures    Medications Ordered in ED Medications  insulin regular, human (MYXREDLIN) 100 units/ 100 mL infusion (4.8 Units/hr Intravenous Rate/Dose Change 07/14/22 1801)  lactated ringers infusion ( Intravenous New Bag/Given 07/14/22 1544)  dextrose 5 % in lactated ringers infusion (has no  administration in time range)  dextrose 50 % solution 0-50 mL (has no administration in time range)  ondansetron (ZOFRAN) injection 4 mg (has no administration in time range)  ondansetron (ZOFRAN) injection 4 mg (4 mg Intravenous Given 07/14/22 1451)  lactated ringers bolus 2,000 mL (2,000 mLs Intravenous New Bag/Given 07/14/22 1605)  oxyCODONE-acetaminophen (PERCOCET/ROXICET) 5-325 MG per tablet 1 tablet (1 tablet Oral Given 07/14/22 1622)    ED Course/ Medical Decision Making/ A&P Clinical Course as of 07/14/22 1815  Thu Jul 14, 2022  1545 Glucose(!!): 747 [MC]  1726 Beta-Hydroxybutyric Acid(!): >8.00 [MC]  1726 pH, Ven(!!): 7.076 [MC]  1727 Glucose-Capillary(!): 423 [MC]  1813 Glucose-Capillary(!): 354 [MC]    Clinical Course User Index [MC] Lenard Simmer, PA-C                             Medical Decision Making Amount and/or Complexity of Data Reviewed Labs: ordered.  Risk Prescription drug management.   This patient presents to the ED with chief complaint(s) of nausea, vomiting, diarrhea, likely in DKA with pertinent past medical history of poorly controlled type 1 diabetes, DKA.  The complaint involves an extensive differential diagnosis and also carries with it a high risk of complications and morbidity.    The differential diagnosis includes DKA, nonketotic hyperglycemia, non-compliance with insulin, infectious etiology, toxicologic exposure  The initial plan is to obtain DKA workup  Additional history obtained: Records reviewed previous admission documents - patient has been admitted multiple times this year for DKA.  Most recent hospital admission was 06/25/22 at [facility].   Initial Assessment:   Exam significant for ill-appearing patient who is not in acute distress.  She is tachypneic, but speaking in full sentences.  Heart  rate tachycardic in the 120s, sinus rhythm on the monitor.  Abdomen is soft and non tender to palpation.  Mucus membranes are  dry.  Skin is warm and dry.    Independent ECG/labs interpretation:  The following labs were independently interpreted:  Initial CBG >600.  CBC without leukocytosis or anemia.  Metabolic panel with hyperglycemia at 747, elevated potassium at 5.3, and elevated Cr at 1.53.  Beta-hydroxybutyric acid >8.  Corrected sodium 151.  UA without infection, there is glucosuria and microscopic hemoglobin.  Glucose coming down with treatment and is now under 400.  Treatment and Reassessment: Patient given IV fluid boluses and Zofran.  Endo tool order set placed for DKA.  Patient complaining of body pain and states that she normally takes oxycodone for her pain.  PDMP review does not show active prescription of pain medicine.  Patient given single dose of pain medicine with improvement in her symptoms.    Consultations obtained:   I requested consultation with on call hospitalist and spoke with Dr. Noralee Stain who agreed with hospital admission.   Disposition:   Patient to be admitted to hospital for DKA.           Final Clinical Impression(s) / ED Diagnoses Final diagnoses:  Diabetic ketoacidosis without coma associated with type 1 diabetes mellitus [name])    Rx / DC Orders ED Discharge Orders     None         Lenard Simmer, PA-C 07/14/22 1815    Rondel Baton, MD 07/15/22 [phone]

## 2022-07-14 NOTE — Progress Notes (Signed)
Patient arrived at the unit,CHG bath given,CCMD notified,vitals taken,CBG 338,pt is in endo tools,pt oriented to the unit

## 2022-07-14 NOTE — ED Notes (Signed)
Got patient into a gown on the monitor patient has family at bedside and call bell in reach got patient some warm blakets checked patient blood sugar it was reading greater then 600
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
## 2022-07-14 NOTE — Progress Notes (Signed)
[facility] night cross cover note:   I was notified by RN that the patient is complaining of generalized pain, for which she received oxycodone in the emergency department, the patient noting temporary improvement in this discomfort with the oxycodone.  She requests additional oxycodone at this time.  I subsequently placed order for oxycodone 5 mg p.o. every 4 hours as needed for pain.  I have also noted her acute kidney injury, and depending upon the trajectory of her renal function, may need to modify the nature of her opioid intervention.     Newton Pigg, DO Hospitalist

## 2022-07-14 NOTE — ED Triage Notes (Signed)
Pt from home c.o n/v/d, hyperglycemia. Pt started to have bodyaches and diarrhea yesterday. She had an insulin pump, removed it yesterday but did not have a replacement.   CBG reading HIGH Pt vomiting upon arrival to ED, pt a.ox4

## 2022-07-15 DIAGNOSIS — Z7722 Contact with and (suspected) exposure to environmental tobacco smoke (acute) (chronic): Secondary | ICD-10-CM | POA: Diagnosis not present

## 2022-07-15 DIAGNOSIS — R519 Headache, unspecified: Secondary | ICD-10-CM | POA: Diagnosis not present

## 2022-07-15 DIAGNOSIS — F909 Attention-deficit hyperactivity disorder, unspecified type: Secondary | ICD-10-CM | POA: Diagnosis not present

## 2022-07-15 DIAGNOSIS — F4321 Adjustment disorder with depressed mood: Secondary | ICD-10-CM | POA: Diagnosis not present

## 2022-07-15 DIAGNOSIS — N179 Acute kidney failure, unspecified: Secondary | ICD-10-CM | POA: Diagnosis not present

## 2022-07-15 DIAGNOSIS — Z91040 Latex allergy status: Secondary | ICD-10-CM | POA: Diagnosis not present

## 2022-07-15 DIAGNOSIS — Z91013 Allergy to seafood: Secondary | ICD-10-CM | POA: Diagnosis not present

## 2022-07-15 DIAGNOSIS — Z9641 Presence of insulin pump (external) (internal): Secondary | ICD-10-CM | POA: Diagnosis not present

## 2022-07-15 DIAGNOSIS — R079 Chest pain, unspecified: Secondary | ICD-10-CM | POA: Diagnosis not present

## 2022-07-15 DIAGNOSIS — Z91048 Other nonmedicinal substance allergy status: Secondary | ICD-10-CM | POA: Diagnosis not present

## 2022-07-15 DIAGNOSIS — Z794 Long term (current) use of insulin: Secondary | ICD-10-CM | POA: Diagnosis not present

## 2022-07-15 DIAGNOSIS — E1010 Type 1 diabetes mellitus with ketoacidosis without coma: Secondary | ICD-10-CM | POA: Diagnosis not present

## 2022-07-15 DIAGNOSIS — Z79899 Other long term (current) drug therapy: Secondary | ICD-10-CM | POA: Diagnosis not present

## 2022-07-15 DIAGNOSIS — E1110 Type 2 diabetes mellitus with ketoacidosis without coma: Secondary | ICD-10-CM | POA: Diagnosis not present

## 2022-07-15 DIAGNOSIS — Z833 Family history of diabetes mellitus: Secondary | ICD-10-CM | POA: Diagnosis not present

## 2022-07-15 DIAGNOSIS — Z91199 Patient's noncompliance with other medical treatment and regimen due to unspecified reason: Secondary | ICD-10-CM | POA: Diagnosis not present

## 2022-07-15 LAB — BASIC METABOLIC PANEL
Anion gap: 12 (ref 5–15)
Anion gap: 10 (ref 5–15)
Anion gap: 15 (ref 5–15)
Anion gap: 12 (ref 5–15)
Anion gap: 10 (ref 5–15)
BUN: 14 mg/dL (ref 6–20)
BUN: 10 mg/dL (ref 6–20)
BUN: 10 mg/dL (ref 6–20)
BUN: 10 mg/dL (ref 6–20)
BUN: 7 mg/dL (ref 6–20)
CO2: 11 mmol/L — ABNORMAL LOW (ref 22–32)
CO2: 15 mmol/L — ABNORMAL LOW (ref 22–32)
CO2: 10 mmol/L — ABNORMAL LOW (ref 22–32)
CO2: 13 mmol/L — ABNORMAL LOW (ref 22–32)
CO2: 15 mmol/L — ABNORMAL LOW (ref 22–32)
Calcium: 8.7 mg/dL — ABNORMAL LOW (ref 8.9–10.3)
Calcium: 9 mg/dL (ref 8.9–10.3)
Calcium: 8.8 mg/dL — ABNORMAL LOW (ref 8.9–10.3)
Calcium: 8.9 mg/dL (ref 8.9–10.3)
Calcium: 8.6 mg/dL — ABNORMAL LOW (ref 8.9–10.3)
Chloride: 112 mmol/L — ABNORMAL HIGH (ref 98–111)
Chloride: 110 mmol/L (ref 98–111)
Chloride: 109 mmol/L (ref 98–111)
Chloride: 111 mmol/L (ref 98–111)
Chloride: 109 mmol/L (ref 98–111)
Creatinine, Ser: 0.98 mg/dL (ref 0.44–1.00)
Creatinine, Ser: 0.78 mg/dL (ref 0.44–1.00)
Creatinine, Ser: 0.87 mg/dL (ref 0.44–1.00)
Creatinine, Ser: 0.88 mg/dL (ref 0.44–1.00)
Creatinine, Ser: 0.61 mg/dL (ref 0.44–1.00)
GFR, Estimated: >60 mL/min (ref >60)
GFR, Estimated: >60 mL/min (ref >60)
GFR, Estimated: >60 mL/min (ref >60)
GFR, Estimated: >60 mL/min (ref >60)
GFR, Estimated: >60 mL/min (ref >60)
Glucose, Bld: 190 mg/dL — ABNORMAL HIGH (ref 70–99)
Glucose, Bld: 92 mg/dL (ref 70–99)
Glucose, Bld: 224 mg/dL — ABNORMAL HIGH (ref 70–99)
Glucose, Bld: 179 mg/dL — ABNORMAL HIGH (ref 70–99)
Glucose, Bld: 134 mg/dL — ABNORMAL HIGH (ref 70–99)
Potassium: 4.1 mmol/L (ref 3.5–5.1)
Potassium: 3.5 mmol/L (ref 3.5–5.1)
Potassium: 4.1 mmol/L (ref 3.5–5.1)
Potassium: 3.8 mmol/L (ref 3.5–5.1)
Potassium: 3.6 mmol/L (ref 3.5–5.1)
Sodium: 135 mmol/L (ref 135–145)
Sodium: 135 mmol/L (ref 135–145)
Sodium: 134 mmol/L — ABNORMAL LOW (ref 135–145)
Sodium: 136 mmol/L (ref 135–145)
Sodium: 134 mmol/L — ABNORMAL LOW (ref 135–145)

## 2022-07-15 LAB — GLUCOSE, CAPILLARY
Glucose-Capillary: 119 mg/dL — ABNORMAL HIGH (ref 70–99)
Glucose-Capillary: 128 mg/dL — ABNORMAL HIGH (ref 70–99)
Glucose-Capillary: 134 mg/dL — ABNORMAL HIGH (ref 70–99)
Glucose-Capillary: 135 mg/dL — ABNORMAL HIGH (ref 70–99)
Glucose-Capillary: 138 mg/dL — ABNORMAL HIGH (ref 70–99)
Glucose-Capillary: 144 mg/dL — ABNORMAL HIGH (ref 70–99)
Glucose-Capillary: 146 mg/dL — ABNORMAL HIGH (ref 70–99)
Glucose-Capillary: 149 mg/dL — ABNORMAL HIGH (ref 70–99)
Glucose-Capillary: 150 mg/dL — ABNORMAL HIGH (ref 70–99)
Glucose-Capillary: 155 mg/dL — ABNORMAL HIGH (ref 70–99)
Glucose-Capillary: 163 mg/dL — ABNORMAL HIGH (ref 70–99)
Glucose-Capillary: 167 mg/dL — ABNORMAL HIGH (ref 70–99)
Glucose-Capillary: 174 mg/dL — ABNORMAL HIGH (ref 70–99)
Glucose-Capillary: 176 mg/dL — ABNORMAL HIGH (ref 70–99)
Glucose-Capillary: 182 mg/dL — ABNORMAL HIGH (ref 70–99)
Glucose-Capillary: 186 mg/dL — ABNORMAL HIGH (ref 70–99)
Glucose-Capillary: 196 mg/dL — ABNORMAL HIGH (ref 70–99)
Glucose-Capillary: 91 mg/dL (ref 70–99)

## 2022-07-15 LAB — BETA-HYDROXYBUTYRIC ACID
Beta-Hydroxybutyric Acid: 2.94 mmol/L — ABNORMAL HIGH (ref 0.05–0.27)
Beta-Hydroxybutyric Acid: 7.12 mmol/L — ABNORMAL HIGH (ref 0.05–0.27)
Beta-Hydroxybutyric Acid: 4.34 mmol/L — ABNORMAL HIGH (ref 0.05–0.27)
Beta-Hydroxybutyric Acid: 2.61 mmol/L — ABNORMAL HIGH (ref 0.05–0.27)

## 2022-07-15 MED ORDER — INSULIN REGULAR(HUMAN) IN NACL 100-0.9 UT/100ML-% IV SOLN
INTRAVENOUS | Status: DC
  Administered 2022-07-15: 2.8 [IU]/h via INTRAVENOUS
  Filled 2022-07-15: qty 100

## 2022-07-15 MED ORDER — INSULIN ASPART 100 UNIT/ML IJ SOLN: 3.0000 [IU] | Freq: Three times a day (TID) | INTRAMUSCULAR | Status: DC

## 2022-07-15 MED ORDER — LACTATED RINGERS IV SOLN: INTRAVENOUS | Status: DC

## 2022-07-15 MED ORDER — INSULIN GLARGINE-YFGN 100 UNIT/ML ~~LOC~~ SOLN
20.0000 [IU] | SUBCUTANEOUS | Status: DC
  Administered 2022-07-15: 20 [IU] via SUBCUTANEOUS
  Filled 2022-07-15: qty 0.2

## 2022-07-15 MED ORDER — LACTATED RINGERS IV BOLUS
20.0000 mL/kg | Freq: Once | INTRAVENOUS | Status: AC
  Administered 2022-07-15: 1000 mL via INTRAVENOUS

## 2022-07-15 MED ORDER — DEXTROSE 50 % IV SOLN: 0.0000 mL | INTRAVENOUS | Status: DC | PRN

## 2022-07-15 MED ORDER — INSULIN ASPART 100 UNIT/ML IJ SOLN: 0.0000 [IU] | Freq: Every day | INTRAMUSCULAR | Status: DC

## 2022-07-15 MED ORDER — POTASSIUM CHLORIDE CRYS ER 20 MEQ PO TBCR
20.0000 meq | EXTENDED_RELEASE_TABLET | Freq: Once | ORAL | Status: AC
  Administered 2022-07-15: 20 meq via ORAL
  Filled 2022-07-15: qty 1

## 2022-07-15 MED ORDER — INSULIN ASPART 100 UNIT/ML IJ SOLN: 0.0000 [IU] | Freq: Three times a day (TID) | INTRAMUSCULAR | Status: DC

## 2022-07-15 MED ORDER — DEXTROSE IN LACTATED RINGERS 5 % IV SOLN: INTRAVENOUS | Status: DC

## 2022-07-15 MED ORDER — POTASSIUM CHLORIDE 10 MEQ/100ML IV SOLN: 10.0000 meq | INTRAVENOUS | Status: DC

## 2022-07-15 MED ORDER — INSULIN ASPART 100 UNIT/ML IJ SOLN
0.0000 [IU] | Freq: Three times a day (TID) | INTRAMUSCULAR | Status: DC
  Administered 2022-07-15 (×2): 3 [IU] via SUBCUTANEOUS

## 2022-07-15 NOTE — Progress Notes (Signed)
  [facility] night cross cover note:   Regarding this patient who was admitted for DKA and started on insulin drip at the time of admission, I was notified by the patient's RN that the patient's anion gap has closed, most recently was noted to be 12. Most recent CBG noted to be 167, and the patient is tolerating p.o. at this time.  Consequently will initiate the transition from IV insulin to subcutaneous insulin order set at this time.  Patient's home basal insulin dose is noted to be [place] 30 units subcu daily.  Will initiate approximately two thirds of this dose at this present time.  Correspondingly, I have ordered Semglee 20 units SQ daily, first dose now.  Following initiation of this first dose of subcutaneous insulin, will continue insulin drip for another 2 hours during which time will continue existing D5 LR at 125 cc/h in order to prevent hypoglycemia given concomitant continuation of insulin drip over this timeframe.  I have modified the D5 LR order to reflect a stop Time of 530 AM, corresponding with anticipated time of discontinuation of insulin drip.    I have also ordered CBG to be checked at the time of discontinuation of the insulin drip, with associated orders for corrective sliding scale insulin based upon this CBG value.  Subsequent to that, I have initiated ensuing CBG monitoring in the form of QAC/HS CBG checks with associated sliding scale insulin coverage.      Newton Pigg, DO Hospitalist

## 2022-07-15 NOTE — Progress Notes (Signed)
PROGRESS NOTE    Dawn [name]  ZOX:[id number] DOB: 2000/12/22 DOA: 07/14/2022 PCP: Nadyne Coombes, MD     Brief Narrative:  Dawn [name] is a 22 y.o. female with medical history significant of Type 1 DM on insulin pump, medical noncompliance who presents with 2-week history of central chest pain which is sharp in nature, as well as nausea and vomiting.  She states that she took her insulin pump off, then was supposed to see her endocrinologist and insulin pump educator, but missed her appointment and did not replace her insulin pump.  She then tells me that she could not find/obtain the correct parts to administer her insulin.  She admits to thirst, not adhering to diet as she should, had [employer] yesterday and has been drinking alcohol over the weekend.  Her main concern currently is the central chest pain.  She also has a heating pack in her lower back.    She has had a number of DKA hospitalizations in the past year due to noncompliance.  Most recently, patient was admitted at [facility] 6/22 to 6/24.  New events last 24 hours / Subjective: Patient feeling a lot better this morning.  Oxycodone has helped with her body aches.  She is transition to subcutaneous insulin with closing of her anion gap.  She was getting ready to be discharged, however repeat BMP this afternoon showed elevation of anion gap back to 15, CO2 10.  She is being restarted on insulin drip now.  Assessment & Plan:   Principal Problem:   DKA (diabetic ketoacidosis) (HCC) Active Problems:   Poorly controlled type 1 diabetes mellitus (HCC)   Noncompliance with diabetes treatment   AKI (acute kidney injury) (HCC)    DKA, type 1 diabetes, poorly controlled -Recent hemoglobin A1c 06/30/2022 was 11.0 -Followed by endocrinology at [facility] Dr. Benjamine Sprague  -Continue IV insulin, IV fluids per Endotool protocol -Diabetic coordinator consult -Continue to trend BMP, BHA   AKI -Resolved      DVT prophylaxis:  heparin injection 5,000 Units Start: 07/14/22 2200  Code Status: Full code Family Communication: None  Disposition Plan: Home Status is: Observation The patient will require care spanning > 2 midnights and should be moved to inpatient because: Insulin drip    Antimicrobials:  Anti-infectives (From admission, onward)    None        Objective: Vitals:   07/14/22 1900 07/14/22 2329 07/15/22 0741 07/15/22 1145  BP: 113/76 (!) 91/53 117/75 119/78  Pulse: (!) 112 (!) 105    Resp: 16 16    Temp: 98 F (36.7 C) 98 F (36.7 C) 98.3 F (36.8 C) 98.5 F (36.9 C)  TempSrc: Oral Oral Oral Oral  SpO2: 100% 100%    Weight:      Height:        Intake/Output Summary (Last 24 hours) at 07/15/2022 1319 Last data filed at 07/15/2022 0300 Gross per 24 hour  Intake 2679.57 ml  Output --  Net 2679.57 ml   Filed Weights   07/14/22 1404  Weight: 49.4 kg    Examination:  General exam: Appears calm and comfortable  Respiratory system: Clear to auscultation. Respiratory effort normal. No respiratory distress. No conversational dyspnea.  Cardiovascular system: S1 & S2 heard, tachycardic, regular rhythm. No murmurs. No pedal edema. Gastrointestinal system: Abdomen is nondistended, soft and nontender. Normal bowel sounds heard. Central nervous system: Alert and oriented. No focal neurological deficits. Speech clear.  Extremities: Symmetric in appearance  Skin: No  rashes, lesions or ulcers on exposed skin  Psychiatry: Judgement and insight appear normal. Mood & affect appropriate.   Data Reviewed: I have personally reviewed following labs and imaging studies  CBC: Recent Labs  Lab 07/14/22 1444 07/14/22 1450  WBC 10.5  --   NEUTROABS 8.0*  --   HGB 12.0 14.3  HCT 40.4 42.0  MCV 90.2  --   PLT 327  --    Basic Metabolic Panel: Recent Labs  Lab 07/14/22 2115 07/14/22 2213 07/15/22 0141 07/15/22 0745 07/15/22 1225  NA 138 138 135 135 134*  K 4.4  4.7 4.1 3.5 4.1  CL 113* 112* 112* 110 109  CO2 9* 10* 11* 15* 10*  GLUCOSE 166* 146* 190* 92 224*  BUN 17 15 14 10 10   CREATININE 1.18* 1.11* 0.98 0.78 0.87  CALCIUM 9.0 9.2 8.7* 9.0 8.8*   GFR: Estimated Creatinine Clearance: 73.5 mL/min (by C-G formula based on SCr of 0.87 mg/dL). Liver Function Tests: No results for input(s): "AST", "ALT", "ALKPHOS", "BILITOT", "PROT", "ALBUMIN" in the last 168 hours. No results for input(s): "LIPASE", "AMYLASE" in the last 168 hours. No results for input(s): "AMMONIA" in the last 168 hours. Coagulation Profile: No results for input(s): "INR", "PROTIME" in the last 168 hours. Cardiac Enzymes: No results for input(s): "CKTOTAL", "CKMB", "CKMBINDEX", "TROPONINI" in the last 168 hours. BNP (last 3 results) No results for input(s): "PROBNP" in the last 8760 hours. HbA1C: No results for input(s): "HGBA1C" in the last 72 hours. CBG: Recent Labs  Lab 07/15/22 0417 07/15/22 0520 07/15/22 0621 07/15/22 0748 07/15/22 1149  GLUCAP 176* 134* 155* 91 196*   Lipid Profile: No results for input(s): "CHOL", "HDL", "LDLCALC", "TRIG", "CHOLHDL", "LDLDIRECT" in the last 72 hours. Thyroid Function Tests: No results for input(s): "TSH", "T4TOTAL", "FREET4", "T3FREE", "THYROIDAB" in the last 72 hours. Anemia Panel: No results for input(s): "VITAMINB12", "FOLATE", "FERRITIN", "TIBC", "IRON", "RETICCTPCT" in the last 72 hours. Sepsis Labs: No results for input(s): "PROCALCITON", "LATICACIDVEN" in the last 168 hours.  No results found for this or any previous visit (from the past 240 hour(s)).    Radiology Studies: No results found.    Scheduled Meds:  heparin  5,000 Units Subcutaneous Q8H   sertraline  75 mg Oral Daily   Continuous Infusions:  dextrose 5% lactated ringers     insulin     lactated ringers     lactated ringers     potassium chloride       LOS: 0 days   Time spent: 35 minutes   Noralee Stain, DO [facility] 07/15/2022, 1:19 PM   Available via Epic secure chat 7am-7pm After these hours, please refer to coverage provider listed on [email or web address]

## 2022-07-15 NOTE — Inpatient Diabetes Management (Addendum)
Inpatient Diabetes Program Recommendations  AACE/ADA: New Consensus Statement on Inpatient Glycemic Control (2015)  Target Ranges:  Prepandial:   less than 140 mg/dL      Peak postprandial:   less than 180 mg/dL (1-2 hours)      Critically ill patients:  140 - 180 mg/dL   Lab Results  Component Value Date   GLUCAP 91 07/15/2022   HGBA1C 14.3 (H) 05/15/2022    Review of Glycemic Control  Diabetes history: DM 1 (requires basal bolus insulin to correct glucose levels and cover carbohydrates consumed) Outpatient Diabetes medications: [name] 40 units Daily, Humalog 0-8 units tid Current orders for Inpatient glycemic control: Semglee 20 units qd, Novolog 0-15 units tid, 0-5 units hs   Inpatient Diabetes Program Recommendations:   Patient well known to DM coordinators from previous admissions (5 inpatient admissions over the past 6 months). DM coordinators reviewed with patient during multiple admissions regarding basal required and cannot go without her insulin.  Please consider: -Decrease Novolog correction to 0-9 units tid, 0-5 units hs -Add Novolog 3 units tid meal coverage if eats @ least 50% meals 1:20   Latest Reference Range & Units Most Recent  Sodium 135 - 145 mmol/L 134 (L) 07/15/22 12:25  Potassium 3.5 - 5.1 mmol/L 4.1 07/15/22 12:25  Chloride 98 - 111 mmol/L 109 07/15/22 12:25  CO2 22 - 32 mmol/L 10 (L) 07/15/22 12:25  Glucose 70 - 99 mg/dL 578 (H) 4/69/62 95:28  Mean Plasma Glucose mg/dL 413.24 04/03/00 72:53  BUN 6 - 20 mg/dL 10 6/64/40 34:74  Creatinine 0.44 - 1.00 mg/dL 2.59 5/63/87 56:43  Calcium 8.9 - 10.3 mg/dL 8.8 (L) 03/31/49 88:41  Anion gap 5 - 15  15 07/15/22 12:25  BUN/Creatinine Ratio 9 - 23  25 (H) 08/09/21 11:43  eGFR >59 mL/min/1.73 101 08/09/21 11:43  (L): Data is abnormally low (H): Data is abnormally high  Anion gap 15, CO2 10  Dr. Alvino Chapel waiting on results of BHA to evaluate if patient needs to restart IV insulin drip.   Spoke with patient @  bedside to discuss diabetes needs. Patient has appointment with her endocrinologist July 23rd and plans to start on new insulin pump T slim. Patient lost her past job @ [employer] due to days missed from being in the hospital and is currently applying for disability benefits. Has other medication insurance but she has limited finances and transportation. Discussed need to keep supplies such as pen needles and syringes with insulin ahead of time. Also discussed need to keep insulin pump supplies when restarted on new insulin pump. Patient also out of Dexcom G 7 sensors and took patient samples to take home to use until she is able to pick up from her pharmacy.   Thank you, Billy Fischer. Mckenzee Beem, RN, MSN, CDE  Diabetes Coordinator Inpatient Glycemic Control Team Team Pager [phone] (8am-5pm) 07/15/2022 12:03 PM

## 2022-07-16 DIAGNOSIS — E1010 Type 1 diabetes mellitus with ketoacidosis without coma: Secondary | ICD-10-CM | POA: Diagnosis not present

## 2022-07-16 LAB — GLUCOSE, CAPILLARY
Glucose-Capillary: 118 mg/dL — ABNORMAL HIGH (ref 70–99)
Glucose-Capillary: 119 mg/dL — ABNORMAL HIGH (ref 70–99)
Glucose-Capillary: 145 mg/dL — ABNORMAL HIGH (ref 70–99)
Glucose-Capillary: 154 mg/dL — ABNORMAL HIGH (ref 70–99)
Glucose-Capillary: 155 mg/dL — ABNORMAL HIGH (ref 70–99)
Glucose-Capillary: 162 mg/dL — ABNORMAL HIGH (ref 70–99)
Glucose-Capillary: 163 mg/dL — ABNORMAL HIGH (ref 70–99)
Glucose-Capillary: 169 mg/dL — ABNORMAL HIGH (ref 70–99)
Glucose-Capillary: 172 mg/dL — ABNORMAL HIGH (ref 70–99)
Glucose-Capillary: 185 mg/dL — ABNORMAL HIGH (ref 70–99)

## 2022-07-16 LAB — BETA-HYDROXYBUTYRIC ACID
Beta-Hydroxybutyric Acid: 1.43 mmol/L — ABNORMAL HIGH (ref 0.05–0.27)
Beta-Hydroxybutyric Acid: 0.88 mmol/L — ABNORMAL HIGH (ref 0.05–0.27)

## 2022-07-16 LAB — BASIC METABOLIC PANEL
Anion gap: 10 (ref 5–15)
Anion gap: 8 (ref 5–15)
BUN: <5 mg/dL — ABNORMAL LOW (ref 6–20)
BUN: <5 mg/dL — ABNORMAL LOW (ref 6–20)
CO2: 20 mmol/L — ABNORMAL LOW (ref 22–32)
CO2: 22 mmol/L (ref 22–32)
Calcium: 9.1 mg/dL (ref 8.9–10.3)
Calcium: 8.7 mg/dL — ABNORMAL LOW (ref 8.9–10.3)
Chloride: 107 mmol/L (ref 98–111)
Chloride: 108 mmol/L (ref 98–111)
Creatinine, Ser: 0.67 mg/dL (ref 0.44–1.00)
Creatinine, Ser: 0.61 mg/dL (ref 0.44–1.00)
GFR, Estimated: >60 mL/min (ref >60)
GFR, Estimated: >60 mL/min (ref >60)
Glucose, Bld: 139 mg/dL — ABNORMAL HIGH (ref 70–99)
Glucose, Bld: 170 mg/dL — ABNORMAL HIGH (ref 70–99)
Potassium: 3.2 mmol/L — ABNORMAL LOW (ref 3.5–5.1)
Potassium: 3.6 mmol/L (ref 3.5–5.1)
Sodium: 137 mmol/L (ref 135–145)
Sodium: 138 mmol/L (ref 135–145)

## 2022-07-16 MED ORDER — INSULIN DEGLUDEC 100 UNIT/ML ~~LOC~~ SOPN: 20.0000 [IU] | PEN_INJECTOR | Freq: Every day | SUBCUTANEOUS | 0 refills | Status: DC

## 2022-07-16 MED ORDER — GVOKE HYPOPEN 2-PACK 1 MG/0.2ML ~~LOC~~ SOAJ: 1.0000 mg | SUBCUTANEOUS | 0 refills | Status: DC | PRN

## 2022-07-16 MED ORDER — LANCETS MISC: 1.0000 | Freq: Three times a day (TID) | 0 refills | Status: DC

## 2022-07-16 MED ORDER — INSULIN LISPRO (1 UNIT DIAL) 100 UNIT/ML (KWIKPEN): 3.0000 [IU] | PEN_INJECTOR | Freq: Three times a day (TID) | SUBCUTANEOUS | 0 refills | Status: DC

## 2022-07-16 MED ORDER — INSULIN ASPART 100 UNIT/ML IJ SOLN
3.0000 [IU] | Freq: Three times a day (TID) | INTRAMUSCULAR | Status: DC
  Administered 2022-07-16: 3 [IU] via SUBCUTANEOUS

## 2022-07-16 MED ORDER — BLOOD GLUCOSE TEST VI STRP: 1.0000 | ORAL_STRIP | Freq: Three times a day (TID) | 0 refills | Status: DC

## 2022-07-16 MED ORDER — BLOOD GLUCOSE MONITORING SUPPL DEVI: 1.0000 | Freq: Three times a day (TID) | 0 refills | Status: DC

## 2022-07-16 MED ORDER — POTASSIUM CHLORIDE CRYS ER 20 MEQ PO TBCR
40.0000 meq | EXTENDED_RELEASE_TABLET | ORAL | Status: AC
  Administered 2022-07-16 (×2): 40 meq via ORAL
  Filled 2022-07-16 (×2): qty 2

## 2022-07-16 MED ORDER — INSULIN GLARGINE-YFGN 100 UNIT/ML ~~LOC~~ SOLN
20.0000 [IU] | Freq: Every day | SUBCUTANEOUS | Status: DC
  Administered 2022-07-16: 20 [IU] via SUBCUTANEOUS
  Filled 2022-07-16: qty 0.2

## 2022-07-16 MED ORDER — ACETAMINOPHEN 325 MG PO TABS
650.0000 mg | ORAL_TABLET | Freq: Four times a day (QID) | ORAL | Status: DC | PRN
  Administered 2022-07-16: 650 mg via ORAL
  Filled 2022-07-16: qty 2

## 2022-07-16 MED ORDER — INSULIN ASPART 100 UNIT/ML IJ SOLN
0.0000 [IU] | Freq: Three times a day (TID) | INTRAMUSCULAR | Status: DC
  Administered 2022-07-16: 2 [IU] via SUBCUTANEOUS

## 2022-07-16 MED ORDER — LANCET DEVICE MISC: 1.0000 | Freq: Three times a day (TID) | 0 refills | Status: DC

## 2022-07-16 MED ORDER — PEN NEEDLES 31G X 5 MM MISC: 1.0000 | Freq: Three times a day (TID) | 0 refills | Status: DC

## 2022-07-16 MED ORDER — INSULIN ASPART 100 UNIT/ML IJ SOLN: 0.0000 [IU] | Freq: Every day | INTRAMUSCULAR | Status: DC

## 2022-07-16 NOTE — Discharge Summary (Signed)
Physician Discharge Summary  Dawn [name] ZOX:[id number] DOB: Jun 09, 2000 DOA: 07/14/2022  PCP: Nadyne Coombes, MD  Admit date: 07/14/2022 Discharge date: 07/16/2022  Admitted From: Home Disposition: Home  Recommendations for Outpatient Follow-up:  Follow up with endocrinology next week  Discharge Condition: Stable CODE STATUS: Full code Diet recommendation: Carb modified diet  Brief/Interim Summary: Dawn [name] is a 22 y.o. female with medical history significant of Type 1 DM on insulin pump, medical noncompliance who presents with 2-week history of central chest pain which is sharp in nature, as well as nausea and vomiting.  She states that she took her insulin pump off, then was supposed to see her endocrinologist and insulin pump educator, but missed her appointment and did not replace her insulin pump.  She then tells me that she could not find/obtain the correct parts to administer her insulin.  She admits to thirst, not adhering to diet as she should, had [employer] yesterday and has been drinking alcohol over the weekend.  Her main concern currently is the central chest pain.  She also has a heating pack in her lower back.    She has had a number of DKA hospitalizations in the past year due to noncompliance.  Most recently, patient was admitted at [facility] 6/22 to 6/24.  She was admitted to the hospital again for DKA, started on DKA protocol with IV insulin and IV fluid.  She initially improved, and she was transitioned to subcutaneous insulin.  She met with diabetic coordinator. However, her labs started to worsen again with increased anion gap and decreased bicarb levels.  She was restarted on insulin drip.  Had clinical improvement and was transition to subcutaneous insulin for discharge home.  Discharge Diagnoses:   Principal Problem:   DKA (diabetic ketoacidosis) (HCC) Active Problems:   Poorly controlled type 1 diabetes mellitus (HCC)   Noncompliance with diabetes  treatment   DKA, type 1 (HCC)   AKI (acute kidney injury) [facility])   Discharge Instructions  Discharge Instructions     Call MD for:  difficulty breathing, headache or visual disturbances   Complete by: As directed    Call MD for:  extreme fatigue   Complete by: As directed    Call MD for:  persistant dizziness or light-headedness   Complete by: As directed    Call MD for:  persistant nausea and vomiting   Complete by: As directed    Call MD for:  severe uncontrolled pain   Complete by: As directed    Call MD for:  temperature >100.4   Complete by: As directed    Discharge instructions   Complete by: As directed    You were cared for by a hospitalist during your hospital stay. If you have any questions about your discharge medications or the care you received while you were in the hospital after you are discharged, you can call the unit and ask to speak with the hospitalist on call if the hospitalist that took care of you is not available. Once you are discharged, your primary care physician will handle any further medical issues. Please note that NO REFILLS for any discharge medications will be authorized once you are discharged, as it is imperative that you return to your primary care physician (or establish a relationship with a primary care physician if you do not have one) for your aftercare needs so that they can reassess your need for medications and monitor your lab values.   Increase activity slowly  Complete by: As directed       Allergies as of 07/16/2022       Reactions   Latex Itching, Swelling, Other (See Comments)   Skin irritation   Tape Other (See Comments)   Skin irritation   Shellfish Allergy Itching, Rash   Scallops specifically        Medication List     STOP taking these medications    insulin lispro 100 UNIT/ML injection Commonly known as: HUMALOG Replaced by: insulin lispro 100 UNIT/ML KwikPen       TAKE these medications    Accu-Chek Guide  w/Device Kit Use 3 (three) times daily before meals. What changed: Another medication with the same name was added. Make sure you understand how and when to take each.   Blood Glucose Monitoring Suppl Devi 1 each by Does not apply route 3 (three) times daily. May dispense any manufacturer covered by patient's insurance. What changed: You were already taking a medication with the same name, and this prescription was added. Make sure you understand how and when to take each.   Baqsimi Two Pack 3 MG/DOSE Powd Generic drug: Glucagon Place 3 mg into the nose as needed (low blood sugar). What changed: Another medication with the same name was added. Make sure you understand how and when to take each.   Gvoke HypoPen 2-Pack 1 MG/0.2ML Soaj Generic drug: Glucagon Inject 1 mg into the skin as needed for up to 2 doses (Severe low blood sugar). What changed: You were already taking a medication with the same name, and this prescription was added. Make sure you understand how and when to take each.   BD Pen Needle Nano 2nd Gen 32G X 4 MM Misc Generic drug: Insulin Pen Needle USE TO INJECT INSULIN 6 TIMES DAILY What changed: Another medication with the same name was added. Make sure you understand how and when to take each.   Pen Needles 31G X 5 MM Misc 1 each by Does not apply route 3 (three) times daily. May dispense any manufacturer covered by patient's insurance. What changed: You were already taking a medication with the same name, and this prescription was added. Make sure you understand how and when to take each.   BLOOD GLUCOSE TEST STRIPS Strp 1 each by Does not apply route 3 (three) times daily. Use as directed to check blood sugar. May dispense any manufacturer covered by patient's insurance and fits patient's device.   Dexcom G7 Sensor Misc 1 Application by Does not apply route as directed.   escitalopram 10 MG tablet Commonly known as: LEXAPRO Take 10 mg by mouth daily.    ibuprofen 600 MG tablet Commonly known as: ADVIL Take 1 tablet (600 mg total) by mouth every 6 (six) hours as needed for moderate pain or mild pain. What changed: how much to take   insulin degludec 100 UNIT/ML FlexTouch Pen Commonly known as: [name] Inject 20 Units into the skin daily. May substitute as needed per insurance. Start taking on: July 17, 2022 What changed:  how much to take additional instructions   insulin lispro 100 UNIT/ML KwikPen Commonly known as: HUMALOG Inject 3 Units into the skin 3 (three) times daily with meals. If eating and Blood Glucose (BG) 80 or higher inject 3 units for meal coverage and add correction dose per scale. If not eating, correction dose only. BG <150= 0 unit; BG 150-200= 1 unit; BG 201-250= 2 unit; BG 251-300= 3 unit; BG 301-350= 4 unit; BG 351-400=  5 unit; BG >400= 6 unit and Call Primary Care. Replaces: insulin lispro 100 UNIT/ML injection   Lancet Device Misc 1 each by Does not apply route 3 (three) times daily. May dispense any manufacturer covered by patient's insurance.   Lancets Misc 1 each by Does not apply route 3 (three) times daily. Use as directed to check blood sugar. May dispense any manufacturer covered by patient's insurance and fits patient's device.   medroxyPROGESTERone 150 MG/ML injection Commonly known as: DEPO-PROVERA Inject 150 mg into the muscle every 3 (three) months.   PRENATAL PO Take 2 each by mouth daily. Prenatal gummies   SUMAtriptan 25 MG tablet Commonly known as: Imitrex Take 0.5 tablets (12.5 mg total) by mouth every 2 (two) hours as needed for migraine. May repeat in 2 hours if headache persists or recurs.        Follow-up Information     Nadyne Coombes, MD Follow up.   Specialty: Internal Medicine Contact information: [address] [place] [address] [phone]         Nile Dear, DO. Schedule an appointment as soon as possible for a visit in 1 week(s).   Specialty:  Endocrinology Contact information: [address] [place] [address] [phone]                Allergies  Allergen Reactions   Latex Itching, Swelling and Other (See Comments)    Skin irritation   Tape Other (See Comments)    Skin irritation   Shellfish Allergy Itching and Rash    Scallops specifically     Procedures/Studies: No results found.     Discharge Exam: Vitals:   07/16/22 0400 07/16/22 0739  BP: 118/81 121/82  Pulse: 91 96  Resp: 16 16  Temp: 98.3 F (36.8 C) 98 F (36.7 C)  SpO2: 100% 100%    General: Pt is alert, awake, not in acute distress Cardiovascular: RRR, S1/S2 +, no edema Respiratory: CTA bilaterally, no wheezing, no rhonchi, no respiratory distress, no conversational dyspnea  Abdominal: Soft, NT, ND, bowel sounds + Extremities: no edema, no cyanosis Psych: Normal mood and affect, stable judgement and insight     The results of significant diagnostics from this hospitalization (including imaging, microbiology, ancillary and laboratory) are listed below for reference.     Microbiology: No results found for this or any previous visit (from the past 240 hour(s)).   Labs: BNP (last 3 results) Recent Labs    03/30/22 0308  BNP 416.8*   Basic Metabolic Panel: Recent Labs  Lab 07/15/22 1225 07/15/22 1439 07/15/22 1855 07/16/22 0124 07/16/22 1155  NA 134* 136 134* 137 138  K 4.1 3.8 3.6 3.2* 3.6  CL 109 111 109 107 108  CO2 10* 13* 15* 20* 22  GLUCOSE 224* 179* 134* 139* 170*  BUN 10 10 7  <5* <5*  CREATININE 0.87 0.88 0.61 0.67 0.61  CALCIUM 8.8* 8.9 8.6* 9.1 8.7*   Liver Function Tests: No results for input(s): "AST", "ALT", "ALKPHOS", "BILITOT", "PROT", "ALBUMIN" in the last 168 hours. No results for input(s): "LIPASE", "AMYLASE" in the last 168 hours. No results for input(s): "AMMONIA" in the last 168 hours. CBC: Recent Labs  Lab 07/14/22 1444 07/14/22 1450  WBC 10.5  --   NEUTROABS 8.0*  --    HGB 12.0 14.3  HCT 40.4 42.0  MCV 90.2  --   PLT 327  --    Cardiac Enzymes: No results for input(s): "CKTOTAL", "CKMB", "CKMBINDEX", "  TROPONINI" in the last 168 hours. BNP: Invalid input(s): "POCBNP" CBG: Recent Labs  Lab 07/16/22 0507 07/16/22 0700 07/16/22 0836 07/16/22 1027 07/16/22 1209  GLUCAP 163* 169* 162* 118* 185*   D-Dimer No results for input(s): "DDIMER" in the last 72 hours. Hgb A1c No results for input(s): "HGBA1C" in the last 72 hours. Lipid Profile No results for input(s): "CHOL", "HDL", "LDLCALC", "TRIG", "CHOLHDL", "LDLDIRECT" in the last 72 hours. Thyroid function studies No results for input(s): "TSH", "T4TOTAL", "T3FREE", "THYROIDAB" in the last 72 hours.  Invalid input(s): "FREET3" Anemia work up No results for input(s): "VITAMINB12", "FOLATE", "FERRITIN", "TIBC", "IRON", "RETICCTPCT" in the last 72 hours. Urinalysis    Component Value Date/Time   COLORURINE STRAW (A) 07/14/2022 1607   APPEARANCEUR CLEAR 07/14/2022 1607   APPEARANCEUR Clear 06/16/2021 1517   LABSPEC 1.020 07/14/2022 1607   PHURINE 5.0 07/14/2022 1607   GLUCOSEU >=500 (A) 07/14/2022 1607   HGBUR MODERATE (A) 07/14/2022 1607   BILIRUBINUR NEGATIVE 07/14/2022 1607   BILIRUBINUR negative 10/14/2021 0942   BILIRUBINUR Negative 06/16/2021 1517   KETONESUR 80 (A) 07/14/2022 1607   PROTEINUR NEGATIVE 07/14/2022 1607   UROBILINOGEN 0.2 10/14/2021 0942   UROBILINOGEN 0.2 08/26/2019 1827   NITRITE NEGATIVE 07/14/2022 1607   LEUKOCYTESUR NEGATIVE 07/14/2022 1607   Sepsis Labs Recent Labs  Lab 07/14/22 1444  WBC 10.5   Microbiology No results found for this or any previous visit (from the past 240 hour(s)).   Patient was seen and examined on the day of discharge and was found to be in stable condition. Time coordinating discharge: 35 minutes including assessment and coordination of care, as well as examination of the patient.   SIGNED:  Noralee Stain, DO [facility] 07/16/2022, 12:55 PM

## 2022-07-16 NOTE — Progress Notes (Signed)
TR[facility] night cross cover note:   I was notified by RN that the patient continues to experience a headache, refractory to prn oxycodone. I subsequently added prn acetaminophen and to further address her headache.     Newton Pigg, DO Hospitalist

## 2022-08-02 ENCOUNTER — Encounter (HOSPITAL_COMMUNITY): Payer: Self-pay

## 2022-08-02 ENCOUNTER — Inpatient Hospital Stay (HOSPITAL_COMMUNITY)
Admission: EM | Admit: 2022-08-02 | Discharge: 2022-08-05 | DRG: 638 | Disposition: A | Discharge status: Home / Self Care | Payer: 59 | Attending: Internal Medicine | Admitting: Internal Medicine

## 2022-08-02 ENCOUNTER — Other Ambulatory Visit: Payer: Self-pay

## 2022-08-02 DIAGNOSIS — T383X6A Underdosing of insulin and oral hypoglycemic [antidiabetic] drugs, initial encounter: Secondary | ICD-10-CM | POA: Diagnosis present

## 2022-08-02 DIAGNOSIS — E1010 Type 1 diabetes mellitus with ketoacidosis without coma: Secondary | ICD-10-CM | POA: Diagnosis not present

## 2022-08-02 DIAGNOSIS — E876 Hypokalemia: Secondary | ICD-10-CM | POA: Diagnosis not present

## 2022-08-02 DIAGNOSIS — R000 Tachycardia, unspecified: Secondary | ICD-10-CM | POA: Diagnosis not present

## 2022-08-02 DIAGNOSIS — Z8249 Family history of ischemic heart disease and other diseases of the circulatory system: Secondary | ICD-10-CM

## 2022-08-02 DIAGNOSIS — E1110 Type 2 diabetes mellitus with ketoacidosis without coma: Secondary | ICD-10-CM | POA: Diagnosis present

## 2022-08-02 DIAGNOSIS — Z9641 Presence of insulin pump (external) (internal): Secondary | ICD-10-CM | POA: Diagnosis present

## 2022-08-02 DIAGNOSIS — Z833 Family history of diabetes mellitus: Secondary | ICD-10-CM

## 2022-08-02 DIAGNOSIS — G8929 Other chronic pain: Secondary | ICD-10-CM | POA: Diagnosis present

## 2022-08-02 DIAGNOSIS — Z91040 Latex allergy status: Secondary | ICD-10-CM

## 2022-08-02 DIAGNOSIS — Z91128 Patient's intentional underdosing of medication regimen for other reason: Secondary | ICD-10-CM

## 2022-08-02 DIAGNOSIS — E875 Hyperkalemia: Secondary | ICD-10-CM | POA: Diagnosis present

## 2022-08-02 DIAGNOSIS — Z794 Long term (current) use of insulin: Secondary | ICD-10-CM

## 2022-08-02 DIAGNOSIS — G629 Polyneuropathy, unspecified: Secondary | ICD-10-CM | POA: Diagnosis present

## 2022-08-02 DIAGNOSIS — Z609 Problem related to social environment, unspecified: Secondary | ICD-10-CM

## 2022-08-02 DIAGNOSIS — R1110 Vomiting, unspecified: Secondary | ICD-10-CM | POA: Diagnosis not present

## 2022-08-02 DIAGNOSIS — Z5941 Food insecurity: Secondary | ICD-10-CM

## 2022-08-02 DIAGNOSIS — N179 Acute kidney failure, unspecified: Secondary | ICD-10-CM | POA: Diagnosis present

## 2022-08-02 DIAGNOSIS — Z825 Family history of asthma and other chronic lower respiratory diseases: Secondary | ICD-10-CM

## 2022-08-02 DIAGNOSIS — Z79899 Other long term (current) drug therapy: Secondary | ICD-10-CM

## 2022-08-02 DIAGNOSIS — M7918 Myalgia, other site: Secondary | ICD-10-CM | POA: Diagnosis present

## 2022-08-02 DIAGNOSIS — Z608 Other problems related to social environment: Secondary | ICD-10-CM | POA: Diagnosis present

## 2022-08-02 DIAGNOSIS — J45909 Unspecified asthma, uncomplicated: Secondary | ICD-10-CM | POA: Diagnosis present

## 2022-08-02 DIAGNOSIS — F331 Major depressive disorder, recurrent, moderate: Secondary | ICD-10-CM | POA: Diagnosis present

## 2022-08-02 DIAGNOSIS — E1065 Type 1 diabetes mellitus with hyperglycemia: Secondary | ICD-10-CM | POA: Diagnosis present

## 2022-08-02 DIAGNOSIS — Z91048 Other nonmedicinal substance allergy status: Secondary | ICD-10-CM

## 2022-08-02 DIAGNOSIS — F909 Attention-deficit hyperactivity disorder, unspecified type: Secondary | ICD-10-CM | POA: Diagnosis present

## 2022-08-02 LAB — CBC WITH DIFFERENTIAL/PLATELET
Abs Immature Granulocytes: 0.06 10*3/uL (ref 0.00–0.07)
Basophils Absolute: 0.1 10*3/uL (ref 0.0–0.1)
Basophils Relative: 1 %
Eosinophils Absolute: 0 10*3/uL (ref 0.0–0.5)
Eosinophils Relative: 0 %
HCT: 45.2 % (ref 36.0–46.0)
Hemoglobin: 12.8 g/dL (ref 12.0–15.0)
Immature Granulocytes: 1 %
Lymphocytes Relative: 34 %
Lymphs Abs: 3.7 10*3/uL (ref 0.7–4.0)
MCH: 26.1 pg (ref 26.0–34.0)
MCHC: 28.3 g/dL — ABNORMAL LOW (ref 30.0–36.0)
MCV: 92.2 fL (ref 80.0–100.0)
Monocytes Absolute: 0.4 10*3/uL (ref 0.1–1.0)
Monocytes Relative: 4 %
Neutro Abs: 6.5 10*3/uL (ref 1.7–7.7)
Neutrophils Relative %: 60 %
Platelets: 392 10*3/uL (ref 150–400)
RBC: 4.9 MIL/uL (ref 3.87–5.11)
RDW: 13.6 % (ref 11.5–15.5)
WBC: 10.7 10*3/uL — ABNORMAL HIGH (ref 4.0–10.5)
nRBC: 0 % (ref 0.0–0.2)

## 2022-08-02 LAB — COMPREHENSIVE METABOLIC PANEL
ALT: 16 U/L (ref 0–44)
AST: 25 U/L (ref 15–41)
Albumin: 4.3 g/dL (ref 3.5–5.0)
Alkaline Phosphatase: 124 U/L (ref 38–126)
BUN: 19 mg/dL (ref 6–20)
CO2: <7 mmol/L — ABNORMAL LOW (ref 22–32)
Calcium: 9.7 mg/dL (ref 8.9–10.3)
Chloride: 97 mmol/L — ABNORMAL LOW (ref 98–111)
Creatinine, Ser: 1.39 mg/dL — ABNORMAL HIGH (ref 0.44–1.00)
GFR, Estimated: 55 mL/min — ABNORMAL LOW (ref >60)
Glucose, Bld: 669 mg/dL (ref 70–99)
Potassium: 5.6 mmol/L — ABNORMAL HIGH (ref 3.5–5.1)
Sodium: 131 mmol/L — ABNORMAL LOW (ref 135–145)
Total Bilirubin: 1.4 mg/dL — ABNORMAL HIGH (ref 0.3–1.2)
Total Protein: 8.6 g/dL — ABNORMAL HIGH (ref 6.5–8.1)

## 2022-08-02 LAB — CBG MONITORING, ED
Glucose-Capillary: >600 mg/dL (ref 70–99)
Glucose-Capillary: >600 mg/dL (ref 70–99)

## 2022-08-02 MED ORDER — LACTATED RINGERS IV SOLN: INTRAVENOUS | Status: DC

## 2022-08-02 MED ORDER — INSULIN REGULAR(HUMAN) IN NACL 100-0.9 UT/100ML-% IV SOLN
INTRAVENOUS | Status: DC
  Administered 2022-08-02: 5.5 [IU]/h via INTRAVENOUS
  Filled 2022-08-02 (×2): qty 100

## 2022-08-02 MED ORDER — DEXTROSE IN LACTATED RINGERS 5 % IV SOLN: INTRAVENOUS | Status: DC

## 2022-08-02 MED ORDER — KETOROLAC TROMETHAMINE 15 MG/ML IJ SOLN
15.0000 mg | Freq: Once | INTRAMUSCULAR | Status: AC
  Administered 2022-08-02: 15 mg via INTRAVENOUS
  Filled 2022-08-02: qty 1

## 2022-08-02 MED ORDER — DEXTROSE 50 % IV SOLN: 0.0000 mL | INTRAVENOUS | Status: DC | PRN

## 2022-08-02 MED ORDER — LACTATED RINGERS IV BOLUS
20.0000 mL/kg | Freq: Once | INTRAVENOUS | Status: AC
  Administered 2022-08-02: 962 mL via INTRAVENOUS

## 2022-08-02 NOTE — ED Provider Triage Note (Signed)
Emergency Medicine Provider Triage Evaluation Note  Dawn [name] , a 22 y.o. female  was evaluated in triage.  Pt complains of vomiting and generalized body aches/pain.  Review of Systems  Positive:  Negative:   Physical Exam  BP 127/88 (BP Location: Right Arm)   Pulse (!) 125   Temp 97.8 F (36.6 C) (Oral)   Resp 20   Ht 5' (1.524 m)   Wt 48.1 kg   LMP 07/05/2022 (Approximate)   SpO2 100%   BMI 20.70 kg/m  Gen:   Awake, no distress   Resp:  Normal effort  MSK:   Moves extremities without difficulty  Other:    Medical Decision Making  Medically screening exam initiated at 9:54 PM.  Appropriate orders placed.  [name] was informed that the remainder of the evaluation will be completed by another provider, this initial triage assessment does not replace that evaluation, and the importance of remaining in the ED until their evaluation is complete.  Hx DM type 1. Patient concerned that she is currently in DKA. Non-bloody emesis x2 today and generalized body aches/pain. Patient states that she hasn't been taking her insulin since she "hasn't been eating much"   Dawn [name], [place] 08/02/22 2156

## 2022-08-02 NOTE — ED Triage Notes (Signed)
Pt reports she thinks she is in DKA, hx of Type 1 Diabetes and states this feels similar. She reports emesis x 2 today associated with pain all over her body "I'm hurting all over."

## 2022-08-02 NOTE — ED Provider Notes (Incomplete)
[place] EMERGENCY DEPARTMENT AT [facility] Provider Note   CSN: [id number] Arrival date & time: 08/02/22  2140     History {Add pertinent medical, surgical, social history, OB history to HPI:1} Chief Complaint  Patient presents with  . Emesis    Adrinne Doenges is a 22 y.o. female with past medical history significant for type 1 diabetes, depression, housing insecurity who presents with concern for body aches, emesis, concerned that she is in DKA.  Patient reports the last time she used her insulin was yesterday.  Patient reports that she has not had much food at the house and so did not want to give herself insulin if she was not eating.  She denies any alcohol, drug use.  She denies any blood in her vomit.   Emesis      Home Medications Prior to Admission medications   Medication Sig Start Date End Date Taking? Authorizing Provider  Blood Glucose Monitoring Suppl (ACCU-CHEK GUIDE ME) w/Device KIT Use 3 (three) times daily before meals. 02/18/22   Bess Kinds, MD  Blood Glucose Monitoring Suppl DEVI 1 each by Does not apply route 3 (three) times daily. May dispense any manufacturer covered by patient's insurance. 07/16/22   Noralee Stain, DO  Continuous Glucose Sensor (DEXCOM G7 SENSOR) MISC 1 Application by Does not apply route as directed.    [provider]  escitalopram (LEXAPRO) 10 MG tablet Take 10 mg by mouth daily. 06/08/22   [provider]  Glucagon (BAQSIMI TWO PACK) 3 MG/DOSE POWD Place 3 mg into the nose as needed (low blood sugar).    [provider]  Glucagon (GVOKE HYPOPEN 2-PACK) 1 MG/0.2ML SOAJ Inject 1 mg into the skin as needed for up to 2 doses (Severe low blood sugar). 07/16/22   Noralee Stain, DO  Glucose Blood (BLOOD GLUCOSE TEST STRIPS) STRP 1 each by Does not apply route 3 (three) times daily. Use as directed to check blood sugar. May dispense any manufacturer covered by patient's insurance and fits patient's device.  07/16/22   Noralee Stain, DO  ibuprofen (ADVIL) 600 MG tablet Take 1 tablet (600 mg total) by mouth every 6 (six) hours as needed for moderate pain or mild pain. Patient taking differently: Take 400 mg by mouth every 6 (six) hours as needed for moderate pain or mild pain. 04/01/22   Leroy Sea, MD  insulin degludec ([name]) 100 UNIT/ML FlexTouch Pen Inject 20 Units into the skin daily. May substitute as needed per insurance. 07/17/22   Noralee Stain, DO  insulin lispro (HUMALOG) 100 UNIT/ML KwikPen Inject 3 Units into the skin 3 (three) times daily with meals. If eating and Blood Glucose (BG) 80 or higher inject 3 units for meal coverage and add correction dose per scale. If not eating, correction dose only. BG <150= 0 unit; BG 150-200= 1 unit; BG 201-250= 2 unit; BG 251-300= 3 unit; BG 301-350= 4 unit; BG 351-400= 5 unit; BG >400= 6 unit and Call Primary Care. 07/16/22   Noralee Stain, DO  Insulin Pen Needle (BD PEN NEEDLE NANO 2ND GEN) 32G X 4 MM MISC USE TO INJECT INSULIN 6 TIMES DAILY 05/18/22   Karie Fetch P, DO  Insulin Pen Needle (PEN NEEDLES) 31G X 5 MM MISC 1 each by Does not apply route 3 (three) times daily. May dispense any manufacturer covered by patient's insurance. 07/16/22   Noralee Stain, DO  Lancet Device MISC 1 each by Does not apply route 3 (three) times daily.  May dispense any manufacturer covered by patient's insurance. 07/16/22   Noralee Stain, DO  Lancets MISC 1 each by Does not apply route 3 (three) times daily. Use as directed to check blood sugar. May dispense any manufacturer covered by patient's insurance and fits patient's device. 07/16/22   Noralee Stain, DO  medroxyPROGESTERone (DEPO-PROVERA) 150 MG/ML injection Inject 150 mg into the muscle every 3 (three) months. 06/10/22   [provider]  Prenatal Vit-Fe Fumarate-FA (PRENATAL PO) Take 2 each by mouth daily. Prenatal gummies    [provider]  SUMAtriptan (IMITREX) 25 MG tablet Take 0.5 tablets  (12.5 mg total) by mouth every 2 (two) hours as needed for migraine. May repeat in 2 hours if headache persists or recurs. 04/12/21   Dana Allan, MD      Allergies    Latex, Tape, and Shellfish allergy    Review of Systems   Review of Systems  Gastrointestinal:  Positive for vomiting.  All other systems reviewed and are negative.   Physical Exam Updated Vital Signs BP 127/88 (BP Location: Right Arm)   Pulse (!) 125   Temp 97.8 F (36.6 C) (Oral)   Resp 20   Ht 5' (1.524 m)   Wt 48.1 kg   LMP 07/05/2022 (Approximate)   SpO2 100%   BMI 20.70 kg/m  Physical Exam Vitals and nursing note reviewed.  Constitutional:      General: She is not in acute distress.    Appearance: Normal appearance. She is ill-appearing.     Comments: Thin, somewhat ill-appearing but nontoxic, nonseptic appearing  HENT:     Head: Normocephalic and atraumatic.  Eyes:     General:        Right eye: No discharge.        Left eye: No discharge.  Cardiovascular:     Rate and Rhythm: Regular rhythm. Tachycardia present.     Heart sounds: No murmur heard.    No friction rub. No gallop.  Pulmonary:     Breath sounds: Normal breath sounds.     Comments: Kussmaul respiration, smell of ketones Abdominal:     General: Bowel sounds are normal.     Palpations: Abdomen is soft.  Skin:    General: Skin is warm and dry.     Capillary Refill: Capillary refill takes less than 2 seconds.  Neurological:     Mental Status: She is alert and oriented to person, place, and time.  Psychiatric:        Mood and Affect: Mood normal.        Behavior: Behavior normal.     ED Results / Procedures / Treatments   Labs (all labs ordered are listed, but only abnormal results are displayed) Labs Reviewed  CBG MONITORING, ED - Abnormal; Notable for the following components:      Result Value   Glucose-Capillary >600 (*)    All other components within normal limits  BETA-HYDROXYBUTYRIC ACID  CBC WITH  DIFFERENTIAL/PLATELET  COMPREHENSIVE METABOLIC PANEL  HCG, QUANTITATIVE, PREGNANCY  URINALYSIS, ROUTINE W REFLEX MICROSCOPIC  CBG MONITORING, ED  I-STAT VENOUS BLOOD GAS, ED    EKG None  Radiology No results found.  Procedures .Critical Care  Performed by: Olene Floss, PA-C Authorized by: Olene Floss, PA-C   Critical care provider statement:    Critical care time (minutes):  35   Critical care was necessary to treat or prevent imminent or life-threatening deterioration of the following conditions:  Metabolic crisis (DKA)  Critical care was time spent personally by me on the following activities:  Development of treatment plan with patient or surrogate, discussions with consultants, evaluation of patient's response to treatment, examination of patient, ordering and review of laboratory studies, ordering and review of radiographic studies, ordering and performing treatments and interventions, pulse oximetry, re-evaluation of patient's condition and review of old charts   Care discussed with: admitting provider     {Document cardiac monitor, telemetry assessment procedure when appropriate:1}  Medications Ordered in ED Medications  lactated ringers bolus 962 mL (has no administration in time range)    ED Course/ Medical Decision Making/ A&P   {   Click here for ABCD2, HEART and other calculatorsREFRESH Note before signing :1}                              Medical Decision Making  This patient is a 22 y.o. female who presents to the ED for concern of ***, this involves an extensive number of treatment options, and is a complaint that carries with it a high risk of complications and morbidity. The emergent differential diagnosis prior to evaluation includes, but is not limited to,  *** . This is not an exhaustive differential.   Past Medical History / Co-morbidities / Social History: ***  Additional history: Chart reviewed. Pertinent results include:  ***  Physical Exam: Physical exam performed. The pertinent findings include: ***  Lab Tests: I ordered, and personally interpreted labs.  The pertinent results include:  ***   Imaging Studies: I ordered imaging studies including ***. I independently visualized and interpreted imaging which showed ***. I agree with the radiologist interpretation.   Cardiac Monitoring:  The patient was maintained on a cardiac monitor.  My attending physician Dr. [name] viewed and interpreted the cardiac monitored which showed an underlying rhythm of: ***. I agree with this interpretation.   Medications: I ordered medication including ***  for ***. Reevaluation of the patient after these medicines showed that the patient {resolved/improved/worsened:23923::"improved"}. I have reviewed the patients home medicines and have made adjustments as needed.  Consultations Obtained: I requested consultation with the ***,  and discussed lab and imaging findings as well as pertinent plan - they recommend: ***   Disposition: After consideration of the diagnostic results and the patients response to treatment, I feel that *** .   ***emergency department workup does not suggest an emergent condition requiring admission or immediate intervention beyond what has been performed at this time. The plan is: ***. The patient is safe for discharge and has been instructed to return immediately for worsening symptoms, change in symptoms or any other concerns.  I discussed this case with my attending physician Dr. [name] who cosigned this note including patient's presenting symptoms, physical exam, and planned diagnostics and interventions. Attending physician stated agreement with plan or made changes to plan which were implemented.    Final Clinical Impression(s) / ED Diagnoses Final diagnoses:  None    Rx / DC Orders ED Discharge Orders     None

## 2022-08-02 NOTE — ED Provider Notes (Signed)
[place] EMERGENCY DEPARTMENT AT [facility] Provider Note   CSN: [id number] Arrival date & time: 08/02/22  2140     History  Chief Complaint  Patient presents with   Emesis    Dawn [name] is a 22 y.o. female with past medical history significant for type 1 diabetes, depression, housing insecurity who presents with concern for body aches, emesis, concerned that she is in DKA.  Patient reports the last time she used her insulin was yesterday.  Patient reports that she has not had much food at the house and so did not want to give herself insulin if she was not eating.  She denies any alcohol, drug use.  She denies any blood in her vomit.   Emesis      Home Medications Prior to Admission medications   Medication Sig Start Date End Date Taking? Authorizing Provider  Blood Glucose Monitoring Suppl (ACCU-CHEK GUIDE ME) w/Device KIT Use 3 (three) times daily before meals. 02/18/22   Bess Kinds, MD  Blood Glucose Monitoring Suppl DEVI 1 each by Does not apply route 3 (three) times daily. May dispense any manufacturer covered by patient's insurance. 07/16/22   Noralee Stain, DO  Continuous Glucose Sensor (DEXCOM G7 SENSOR) MISC 1 Application by Does not apply route as directed.    [provider]  escitalopram (LEXAPRO) 10 MG tablet Take 10 mg by mouth daily. 06/08/22   [provider]  Glucagon (BAQSIMI TWO PACK) 3 MG/DOSE POWD Place 3 mg into the nose as needed (low blood sugar).    [provider]  Glucagon (GVOKE HYPOPEN 2-PACK) 1 MG/0.2ML SOAJ Inject 1 mg into the skin as needed for up to 2 doses (Severe low blood sugar). 07/16/22   Noralee Stain, DO  Glucose Blood (BLOOD GLUCOSE TEST STRIPS) STRP 1 each by Does not apply route 3 (three) times daily. Use as directed to check blood sugar. May dispense any manufacturer covered by patient's insurance and fits patient's device. 07/16/22   Noralee Stain, DO  ibuprofen (ADVIL) 600 MG tablet Take 1  tablet (600 mg total) by mouth every 6 (six) hours as needed for moderate pain or mild pain. Patient taking differently: Take 400 mg by mouth every 6 (six) hours as needed for moderate pain or mild pain. 04/01/22   Leroy Sea, MD  insulin degludec ([name]) 100 UNIT/ML FlexTouch Pen Inject 20 Units into the skin daily. May substitute as needed per insurance. 07/17/22   Noralee Stain, DO  insulin lispro (HUMALOG) 100 UNIT/ML KwikPen Inject 3 Units into the skin 3 (three) times daily with meals. If eating and Blood Glucose (BG) 80 or higher inject 3 units for meal coverage and add correction dose per scale. If not eating, correction dose only. BG <150= 0 unit; BG 150-200= 1 unit; BG 201-250= 2 unit; BG 251-300= 3 unit; BG 301-350= 4 unit; BG 351-400= 5 unit; BG >400= 6 unit and Call Primary Care. 07/16/22   Noralee Stain, DO  Insulin Pen Needle (BD PEN NEEDLE NANO 2ND GEN) 32G X 4 MM MISC USE TO INJECT INSULIN 6 TIMES DAILY 05/18/22   Karie Fetch P, DO  Insulin Pen Needle (PEN NEEDLES) 31G X 5 MM MISC 1 each by Does not apply route 3 (three) times daily. May dispense any manufacturer covered by patient's insurance. 07/16/22   Noralee Stain, DO  Lancet Device MISC 1 each by Does not apply route 3 (three) times daily. May dispense any manufacturer covered by patient's insurance. 07/16/22  Noralee Stain, DO  Lancets MISC 1 each by Does not apply route 3 (three) times daily. Use as directed to check blood sugar. May dispense any manufacturer covered by patient's insurance and fits patient's device. 07/16/22   Noralee Stain, DO  medroxyPROGESTERone (DEPO-PROVERA) 150 MG/ML injection Inject 150 mg into the muscle every 3 (three) months. 06/10/22   [provider]  Prenatal Vit-Fe Fumarate-FA (PRENATAL PO) Take 2 each by mouth daily. Prenatal gummies    [provider]  SUMAtriptan (IMITREX) 25 MG tablet Take 0.5 tablets (12.5 mg total) by mouth every 2 (two) hours as needed for migraine. May  repeat in 2 hours if headache persists or recurs. 04/12/21   Dana Allan, MD      Allergies    Latex, Tape, and Shellfish allergy    Review of Systems   Review of Systems  Gastrointestinal:  Positive for vomiting.  All other systems reviewed and are negative.   Physical Exam Updated Vital Signs BP 134/67 (BP Location: Right Arm)   Pulse (!) 129   Temp 97.6 F (36.4 C) (Oral)   Resp (!) 30   Ht 5' (1.524 m)   Wt 48.1 kg   LMP 07/05/2022 (Approximate)   SpO2 100%   BMI 20.70 kg/m  Physical Exam Vitals and nursing note reviewed.  Constitutional:      General: She is not in acute distress.    Appearance: Normal appearance. She is ill-appearing.     Comments: Thin, somewhat ill-appearing but nontoxic, nonseptic appearing  HENT:     Head: Normocephalic and atraumatic.  Eyes:     General:        Right eye: No discharge.        Left eye: No discharge.  Cardiovascular:     Rate and Rhythm: Regular rhythm. Tachycardia present.     Heart sounds: No murmur heard.    No friction rub. No gallop.  Pulmonary:     Breath sounds: Normal breath sounds.     Comments: Kussmaul respiration, smell of ketones Abdominal:     General: Bowel sounds are normal.     Palpations: Abdomen is soft.  Skin:    General: Skin is warm and dry.     Capillary Refill: Capillary refill takes less than 2 seconds.  Neurological:     Mental Status: She is alert and oriented to person, place, and time.  Psychiatric:        Mood and Affect: Mood normal.        Behavior: Behavior normal.     ED Results / Procedures / Treatments   Labs (all labs ordered are listed, but only abnormal results are displayed) Labs Reviewed  BETA-HYDROXYBUTYRIC ACID - Abnormal; Notable for the following components:      Result Value   Beta-Hydroxybutyric Acid >8.00 (*)    All other components within normal limits  CBC WITH DIFFERENTIAL/PLATELET - Abnormal; Notable for the following components:   WBC 10.7 (*)    MCHC  28.3 (*)    All other components within normal limits  COMPREHENSIVE METABOLIC PANEL - Abnormal; Notable for the following components:   Sodium 131 (*)    Potassium 5.6 (*)    Chloride 97 (*)    CO2 <7 (*)    Glucose, Bld 669 (*)    Creatinine, Ser 1.39 (*)    Total Protein 8.6 (*)    Total Bilirubin 1.4 (*)    GFR, Estimated 55 (*)    All other components  within normal limits  URINALYSIS, ROUTINE W REFLEX MICROSCOPIC - Abnormal; Notable for the following components:   Color, Urine STRAW (*)    Glucose, UA >=500 (*)    Hgb urine dipstick MODERATE (*)    Ketones, ur 80 (*)    Protein, ur 30 (*)    Leukocytes,Ua TRACE (*)    All other components within normal limits  BASIC METABOLIC PANEL - Abnormal; Notable for the following components:   CO2 <7 (*)    Glucose, Bld 311 (*)    BUN 21 (*)    Creatinine, Ser 1.55 (*)    GFR, Estimated 49 (*)    All other components within normal limits  CBG MONITORING, ED - Abnormal; Notable for the following components:   Glucose-Capillary >600 (*)    All other components within normal limits  CBG MONITORING, ED - Abnormal; Notable for the following components:   Glucose-Capillary >600 (*)    All other components within normal limits  I-STAT VENOUS BLOOD GAS, ED - Abnormal; Notable for the following components:   pH, Ven 6.925 (*)    pCO2, Ven 15.6 (*)    pO2, Ven 53 (*)    Bicarbonate 3.2 (*)    TCO2 <5 (*)    Acid-base deficit 28.0 (*)    Potassium 5.2 (*)    All other components within normal limits  CBG MONITORING, ED - Abnormal; Notable for the following components:   Glucose-Capillary 427 (*)    All other components within normal limits  CBG MONITORING, ED - Abnormal; Notable for the following components:   Glucose-Capillary 430 (*)    All other components within normal limits  CBG MONITORING, ED - Abnormal; Notable for the following components:   Glucose-Capillary 317 (*)    All other components within normal limits  I-STAT  VENOUS BLOOD GAS, ED - Abnormal; Notable for the following components:   pH, Ven 6.996 (*)    pCO2, Ven 19.9 (*)    pO2, Ven 95 (*)    Bicarbonate 4.8 (*)    TCO2 5 (*)    Acid-base deficit 25.0 (*)    All other components within normal limits  CBG MONITORING, ED - Abnormal; Notable for the following components:   Glucose-Capillary 297 (*)    All other components within normal limits  HCG, QUANTITATIVE, PREGNANCY  HCG, SERUM, QUALITATIVE  BASIC METABOLIC PANEL  BASIC METABOLIC PANEL  BASIC METABOLIC PANEL  BASIC METABOLIC PANEL    EKG None  Radiology No results found.  Procedures .Critical Care  Performed by: Olene Floss, PA-C Authorized by: Olene Floss, PA-C   Critical care provider statement:    Critical care time (minutes):  35   Critical care was necessary to treat or prevent imminent or life-threatening deterioration of the following conditions:  Metabolic crisis (DKA)   Critical care was time spent personally by me on the following activities:  Development of treatment plan with patient or surrogate, discussions with consultants, evaluation of patient's response to treatment, examination of patient, ordering and review of laboratory studies, ordering and review of radiographic studies, ordering and performing treatments and interventions, pulse oximetry, re-evaluation of patient's condition and review of old charts   Care discussed with: admitting provider       Medications Ordered in ED Medications  insulin regular, human (MYXREDLIN) 100 units/ 100 mL infusion (4.6 Units/hr Intravenous Rate/Dose Change 08/03/22 0417)  lactated ringers infusion (0 mLs Intravenous Paused 08/03/22 0325)  dextrose 5 % in lactated  ringers infusion (0 mLs Intravenous Hold 08/02/22 2242)  dextrose 50 % solution 0-50 mL (has no administration in time range)  lactated ringers bolus 962 mL (0 mLs Intravenous Stopped 08/02/22 2348)  ketorolac (TORADOL) 15 MG/ML injection 15 mg  (15 mg Intravenous Given 08/02/22 2248)  morphine (PF) 4 MG/ML injection 4 mg (4 mg Intravenous Given 08/03/22 0201)  lactated ringers bolus 1,000 mL (1,000 mLs Intravenous New Bag/Given 08/03/22 0350)    ED Course/ Medical Decision Making/ A&P Clinical Course as of 08/03/22 0437  Tue Aug 02, 2022  2322 CO2(!): <7 Bicarb less than 7, critical DKA for this patient developing [CP]    Clinical Course User Index [CP] Olene Floss, PA-C                                 Medical Decision Making Amount and/or Complexity of Data Reviewed Labs: ordered. Decision-making details documented in ED Course.  Risk Prescription drug management. Decision regarding hospitalization.   This patient is a 22 y.o. female who presents to the ED for concern of nausea, vomiting, bodyaches, this involves an extensive number of treatment options, and is a complaint that carries with it a high risk of complications and morbidity. The emergent differential diagnosis prior to evaluation includes, but is not limited to, DKA, HHS, hyperglycemia, The causes of generalized abdominal pain include but are not limited to AAA, mesenteric ischemia, appendicitis, diverticulitis, gastritis, gastroenteritis, AMI, nephrolithiasis, pancreatitis, peritonitis, adrenal insufficiency,lead poisoning, iron toxicity, intestinal ischemia, constipation, UTI,SBO/LBO, splenic rupture, biliary disease, IBD, IBS, PUD, or hepatitis. This is not an exhaustive differential.   Past Medical History / Co-morbidities / Social History: type 1 diabetes, depression, housing insecurity  Additional history: Chart reviewed. Pertinent results include: Reviewed lab work, imaging from recent previous emergency department evaluation, patient with admission for DKA just recently  Physical Exam: Physical exam performed. The pertinent findings include: Patient with coo small respiration, is ill-appearing, dry mucous membranes, with significant tachycardia,  pulse of 125 upon arrival.  Possibly secondary to increased respiratory rate, Kussmaul respirations patient with 1 documented oxygen saturation of 87%, and placed on 2 L nasal cannula, she was 100% saturation on room air on arrival.  Lab Tests: I ordered, and personally interpreted labs.  The pertinent results include: Patient with signs of severe DKA, pH on VBG 6.9, 3.2 bicarb, her beta hydroxy is greater than 8, she has mild leukocytosis with white blood cells 10.7.  Pseudohyponatremia, sodium 131 in context of glucose of 669, she has hyperkalemia, potassium 5.6, will not need additional potassium correction for insulin at this time.  Mildly elevated total bilirubin 1.4 is likely secondary to generalized dehydration.  Repeat CBG after patient begins to get fluids and insulin is 430.    Medications: I ordered medication including insulin drip, fluid bolus for DKA, Toradol, morphine for body aches, pain  Consultations Obtained: I requested consultation with the critical care physician, spoke with Dr. Marchelle Gearing who reports that if patient is mentating okay and with normotensive blood pressure he would recommend aggressive rehydration and recheck a VBG, VBG on recheck still with acidotic pH, but some improvement, 6.91-6.99, bicarb still undetectable, as patient continues to [facility] appropriately I requested consult with the hospitalist, spoke with Dr. Antionette Char,  and discussed lab and imaging findings as well as pertinent plan - they recommend: Admission to progressive bed for significant DKA   Disposition: After consideration of the diagnostic results and  the patients response to treatment, I feel that patient requires admission for severe DKA.   I discussed this case with my attending physician Dr. Preston Fleeting who cosigned this note including patient's presenting symptoms, physical exam, and planned diagnostics and interventions. Attending physician stated agreement with plan or made changes to plan which were  implemented.    Final Clinical Impression(s) / ED Diagnoses Final diagnoses:  Diabetic ketoacidosis without coma associated with type 1 diabetes mellitus [facility])    Rx / DC Orders ED Discharge Orders     None         Olene Floss, PA-C 08/03/22 4098    Jacalyn Lefevre, MD 08/03/22 1535

## 2022-08-03 ENCOUNTER — Other Ambulatory Visit: Payer: Self-pay

## 2022-08-03 DIAGNOSIS — J45909 Unspecified asthma, uncomplicated: Secondary | ICD-10-CM | POA: Diagnosis present

## 2022-08-03 DIAGNOSIS — G8929 Other chronic pain: Secondary | ICD-10-CM | POA: Diagnosis present

## 2022-08-03 DIAGNOSIS — E876 Hypokalemia: Secondary | ICD-10-CM | POA: Diagnosis not present

## 2022-08-03 DIAGNOSIS — E875 Hyperkalemia: Secondary | ICD-10-CM | POA: Diagnosis present

## 2022-08-03 DIAGNOSIS — E1010 Type 1 diabetes mellitus with ketoacidosis without coma: Secondary | ICD-10-CM

## 2022-08-03 DIAGNOSIS — Z91040 Latex allergy status: Secondary | ICD-10-CM | POA: Diagnosis not present

## 2022-08-03 DIAGNOSIS — N179 Acute kidney failure, unspecified: Secondary | ICD-10-CM | POA: Diagnosis present

## 2022-08-03 DIAGNOSIS — R1110 Vomiting, unspecified: Secondary | ICD-10-CM | POA: Diagnosis present

## 2022-08-03 DIAGNOSIS — M7918 Myalgia, other site: Secondary | ICD-10-CM | POA: Diagnosis not present

## 2022-08-03 DIAGNOSIS — Z5941 Food insecurity: Secondary | ICD-10-CM | POA: Diagnosis not present

## 2022-08-03 DIAGNOSIS — Z9641 Presence of insulin pump (external) (internal): Secondary | ICD-10-CM | POA: Diagnosis present

## 2022-08-03 DIAGNOSIS — F909 Attention-deficit hyperactivity disorder, unspecified type: Secondary | ICD-10-CM | POA: Diagnosis present

## 2022-08-03 DIAGNOSIS — T383X6A Underdosing of insulin and oral hypoglycemic [antidiabetic] drugs, initial encounter: Secondary | ICD-10-CM | POA: Diagnosis present

## 2022-08-03 DIAGNOSIS — E1065 Type 1 diabetes mellitus with hyperglycemia: Secondary | ICD-10-CM | POA: Diagnosis not present

## 2022-08-03 DIAGNOSIS — Z608 Other problems related to social environment: Secondary | ICD-10-CM | POA: Diagnosis present

## 2022-08-03 DIAGNOSIS — R000 Tachycardia, unspecified: Secondary | ICD-10-CM | POA: Diagnosis not present

## 2022-08-03 DIAGNOSIS — Z8249 Family history of ischemic heart disease and other diseases of the circulatory system: Secondary | ICD-10-CM | POA: Diagnosis not present

## 2022-08-03 DIAGNOSIS — Z91048 Other nonmedicinal substance allergy status: Secondary | ICD-10-CM | POA: Diagnosis not present

## 2022-08-03 DIAGNOSIS — E0810 Diabetes mellitus due to underlying condition with ketoacidosis without coma: Secondary | ICD-10-CM | POA: Diagnosis not present

## 2022-08-03 DIAGNOSIS — Z794 Long term (current) use of insulin: Secondary | ICD-10-CM | POA: Diagnosis not present

## 2022-08-03 DIAGNOSIS — G629 Polyneuropathy, unspecified: Secondary | ICD-10-CM | POA: Diagnosis present

## 2022-08-03 DIAGNOSIS — Z833 Family history of diabetes mellitus: Secondary | ICD-10-CM | POA: Diagnosis not present

## 2022-08-03 DIAGNOSIS — Z825 Family history of asthma and other chronic lower respiratory diseases: Secondary | ICD-10-CM | POA: Diagnosis not present

## 2022-08-03 DIAGNOSIS — Z79899 Other long term (current) drug therapy: Secondary | ICD-10-CM | POA: Diagnosis not present

## 2022-08-03 DIAGNOSIS — F331 Major depressive disorder, recurrent, moderate: Secondary | ICD-10-CM | POA: Diagnosis present

## 2022-08-03 DIAGNOSIS — Z91128 Patient's intentional underdosing of medication regimen for other reason: Secondary | ICD-10-CM | POA: Diagnosis not present

## 2022-08-03 LAB — BASIC METABOLIC PANEL
Anion gap: 12 (ref 5–15)
BUN: 9 mg/dL (ref 6–20)
CO2: 16 mmol/L — ABNORMAL LOW (ref 22–32)
Calcium: 8.9 mg/dL (ref 8.9–10.3)
Chloride: 111 mmol/L (ref 98–111)
Creatinine, Ser: 0.78 mg/dL (ref 0.44–1.00)
GFR, Estimated: >60 mL/min (ref >60)
Glucose, Bld: 141 mg/dL — ABNORMAL HIGH (ref 70–99)
Potassium: 3.3 mmol/L — ABNORMAL LOW (ref 3.5–5.1)
Sodium: 139 mmol/L (ref 135–145)

## 2022-08-03 LAB — CBG MONITORING, ED
Glucose-Capillary: 430 mg/dL — ABNORMAL HIGH (ref 70–99)
Glucose-Capillary: 317 mg/dL — ABNORMAL HIGH (ref 70–99)
Glucose-Capillary: 297 mg/dL — ABNORMAL HIGH (ref 70–99)
Glucose-Capillary: 173 mg/dL — ABNORMAL HIGH (ref 70–99)
Glucose-Capillary: 132 mg/dL — ABNORMAL HIGH (ref 70–99)
Glucose-Capillary: 202 mg/dL — ABNORMAL HIGH (ref 70–99)
Glucose-Capillary: 195 mg/dL — ABNORMAL HIGH (ref 70–99)
Glucose-Capillary: 204 mg/dL — ABNORMAL HIGH (ref 70–99)
Glucose-Capillary: 174 mg/dL — ABNORMAL HIGH (ref 70–99)
Glucose-Capillary: 239 mg/dL — ABNORMAL HIGH (ref 70–99)

## 2022-08-03 LAB — I-STAT VENOUS BLOOD GAS, ED
Acid-base deficit: 25 mmol/L — ABNORMAL HIGH (ref 0.0–2.0)
Bicarbonate: 4.8 mmol/L — ABNORMAL LOW (ref 20.0–28.0)
Calcium, Ion: 1.33 mmol/L (ref 1.15–1.40)
HCT: 39 % (ref 36.0–46.0)
Hemoglobin: 13.3 g/dL (ref 12.0–15.0)
O2 Saturation: 92 %
Potassium: 4.7 mmol/L (ref 3.5–5.1)
Sodium: 141 mmol/L (ref 135–145)
TCO2: 5 mmol/L — ABNORMAL LOW (ref 22–32)
pCO2, Ven: 19.9 mmHg — CL (ref 44–60)
pH, Ven: 6.996 — CL (ref 7.25–7.43)
pO2, Ven: 95 mmHg — ABNORMAL HIGH (ref 32–45)

## 2022-08-03 LAB — BETA-HYDROXYBUTYRIC ACID
Beta-Hydroxybutyric Acid: 5.48 mmol/L — ABNORMAL HIGH (ref 0.05–0.27)
Beta-Hydroxybutyric Acid: 4.88 mmol/L — ABNORMAL HIGH (ref 0.05–0.27)

## 2022-08-03 LAB — GLUCOSE, CAPILLARY
Glucose-Capillary: 122 mg/dL — ABNORMAL HIGH (ref 70–99)
Glucose-Capillary: 140 mg/dL — ABNORMAL HIGH (ref 70–99)
Glucose-Capillary: 143 mg/dL — ABNORMAL HIGH (ref 70–99)
Glucose-Capillary: 172 mg/dL — ABNORMAL HIGH (ref 70–99)
Glucose-Capillary: 174 mg/dL — ABNORMAL HIGH (ref 70–99)
Glucose-Capillary: 176 mg/dL — ABNORMAL HIGH (ref 70–99)
Glucose-Capillary: 227 mg/dL — ABNORMAL HIGH (ref 70–99)

## 2022-08-03 MED ORDER — DEXTROSE IN LACTATED RINGERS 5 % IV SOLN: INTRAVENOUS | Status: DC

## 2022-08-03 MED ORDER — SALINE SPRAY 0.65 % NA SOLN
1.0000 | NASAL | Status: DC | PRN
  Filled 2022-08-03: qty 44

## 2022-08-03 MED ORDER — ESCITALOPRAM OXALATE 10 MG PO TABS
10.0000 mg | ORAL_TABLET | Freq: Every day | ORAL | Status: DC
  Administered 2022-08-03 – 2022-08-05 (×3): 10 mg via ORAL
  Filled 2022-08-03 (×3): qty 1

## 2022-08-03 MED ORDER — LACTATED RINGERS IV SOLN: INTRAVENOUS | Status: DC

## 2022-08-03 MED ORDER — ENOXAPARIN SODIUM 40 MG/0.4ML IJ SOSY
40.0000 mg | PREFILLED_SYRINGE | Freq: Every day | INTRAMUSCULAR | Status: DC
  Administered 2022-08-03: 40 mg via SUBCUTANEOUS
  Filled 2022-08-03 (×2): qty 0.4

## 2022-08-03 MED ORDER — LACTATED RINGERS IV BOLUS
1000.0000 mL | Freq: Once | INTRAVENOUS | Status: AC
  Administered 2022-08-03: 1000 mL via INTRAVENOUS

## 2022-08-03 MED ORDER — KETOROLAC TROMETHAMINE 15 MG/ML IJ SOLN
15.0000 mg | Freq: Four times a day (QID) | INTRAMUSCULAR | Status: DC | PRN
  Administered 2022-08-03 – 2022-08-04 (×4): 15 mg via INTRAVENOUS
  Filled 2022-08-03 (×4): qty 1

## 2022-08-03 MED ORDER — DEXTROSE 50 % IV SOLN: 0.0000 mL | INTRAVENOUS | Status: DC | PRN

## 2022-08-03 MED ORDER — ACETAMINOPHEN 325 MG PO TABS
650.0000 mg | ORAL_TABLET | Freq: Four times a day (QID) | ORAL | Status: DC | PRN
  Administered 2022-08-03: 650 mg via ORAL
  Filled 2022-08-03: qty 2

## 2022-08-03 MED ORDER — LACTATED RINGERS IV BOLUS
2000.0000 mL | Freq: Once | INTRAVENOUS | Status: AC
  Administered 2022-08-03: 2000 mL via INTRAVENOUS

## 2022-08-03 MED ORDER — POTASSIUM CHLORIDE 10 MEQ/100ML IV SOLN
10.0000 meq | INTRAVENOUS | Status: AC
  Administered 2022-08-03 (×2): 10 meq via INTRAVENOUS
  Filled 2022-08-03 (×2): qty 100

## 2022-08-03 MED ORDER — METHOCARBAMOL 500 MG PO TABS
500.0000 mg | ORAL_TABLET | Freq: Four times a day (QID) | ORAL | Status: DC | PRN
  Administered 2022-08-03 – 2022-08-04 (×2): 500 mg via ORAL
  Filled 2022-08-03 (×3): qty 1

## 2022-08-03 MED ORDER — MORPHINE SULFATE (PF) 4 MG/ML IV SOLN
4.0000 mg | Freq: Once | INTRAVENOUS | Status: AC
  Administered 2022-08-03: 4 mg via INTRAVENOUS
  Filled 2022-08-03: qty 1

## 2022-08-03 MED ORDER — ONDANSETRON HCL 4 MG/2ML IJ SOLN: 4.0000 mg | Freq: Once | INTRAMUSCULAR | Status: DC | PRN

## 2022-08-03 MED ORDER — HYDROMORPHONE HCL 1 MG/ML IJ SOLN: 0.5000 mg | Freq: Once | INTRAMUSCULAR | Status: DC | PRN
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
## 2022-08-03 NOTE — ED Notes (Signed)
Called lab to inquire why anion gap read "not calculated", and they stated that since her bicarb is less than 7, they cannot calculate it.

## 2022-08-03 NOTE — Progress Notes (Signed)
Pt arrived to unit via stretcher. Room air no acute distress noted. Able to ambulate from stretcher to bed with standby to minimal assist. C/O 8/10 pain in right flank are relieved with reposition and rest at this time. CBG 227 upon arrival no rate change needed to insulin pump. Skin intact no wounds slight bruising to right arm from venipuncture site. Will continue to monitor

## 2022-08-03 NOTE — ED Notes (Signed)
IV team nurse stated that "patient has nothing to stick, and if she needs something else she will need an EJ or central." Provider being notified.
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
## 2022-08-03 NOTE — ED Notes (Signed)
Pt's family has been calling for an update. Asked pt to give them a call and pt stated she would.

## 2022-08-03 NOTE — Inpatient Diabetes Management (Addendum)
Inpatient Diabetes Program Recommendations  AACE/ADA: New Consensus Statement on Inpatient Glycemic Control   Target Ranges:  Prepandial:   less than 140 mg/dL      Peak postprandial:   less than 180 mg/dL (1-2 hours)      Critically ill patients:  140 - 180 mg/dL    Latest Reference Range & Units 08/03/22 01:33 08/03/22 02:43 08/03/22 04:12 08/03/22 05:30 08/03/22 06:32 08/03/22 08:03 08/03/22 09:11 08/03/22 10:16  Glucose-Capillary 70 - 99 mg/dL 161 (H) 096 (H) 045 (H) 173 (H) 132 (H) 202 (H) 195 (H) 204 (H)    Latest Reference Range & Units 08/02/22 22:21 08/03/22 10:17  CO2 22 - 32 mmol/L <7 (L) 10  Glucose 70 - 99 mg/dL 409 (HH) 811  Anion gap 5 - 15  NOT CALCULATED 18    Latest Reference Range & Units 02/18/22 01:14 05/15/22 21:24  Hemoglobin A1C 4.8 - 5.6 % 12.2 (H) 14.3 (H)   Review of Glycemic Control  Diabetes history: DM1 (does NOT make any insulin, requires basal, correction, and carb coverage insulin) Outpatient Diabetes medications: [name] 20 units daily, Humalog 3 units with meals plus correction Current orders for Inpatient glycemic control: IV insulin  Inpatient Diabetes Program Recommendations:    Insulin: Once acidosis has cleared completely and provider is ready to transition from IV to SQ insulin, please consider ordering Semglee 20 units Q24H, CBGs Q4H, Novolog 0-9 units Q4H, and if diet ordered Novolog 4 units TID with meals for meal coverage if patient eats at least 50% of meals.  NOTE: Noted consult for diabetes coordinator. Patient has Type 1 DM and is well known to inpatient diabetes team due to frequent ED visits and hospital admissions (6 admissions in past 6 months).  Inpatient diabetes coordinator last spoke with patient on 07/15/22. Per H&P today, "She acknowledges not really taking insulin. She had an insurance issue and has not had a pump, just got it yesterday. She reports that she is trying to get her own place because the adults in her house are  eating her food that she gets with food stamps, so she doesn't really give herself insulin due to her food insecurity."   Addendum 08/03/22@13 :45-Spoke with patient at bedside. Patient was asleep upon entering room. Patient opened eyes to name and closed them again. Asked patient about insulin regimen she was taking at home and inquired about any insulin pump use. Patient would not answer questions and kept eyes closed despite calling her name several times. Turned on room lights and again tried to engage patient in conversation and when asked about insulin regimen patient shook her head side to side to indicate "No". When asked if she would share insulin regimen with me she again shook her head side to side to indicate "No". Explained that it was important that we discussed her insulin regimen and need to take insulin consistently to prevent DKA but patient would not open eyes or verbally reply.   Thanks, Orlando Penner, RN, MSN, CDCES Diabetes Coordinator Inpatient Diabetes Program [phone] (Team Pager from 8am to 5pm)

## 2022-08-04 ENCOUNTER — Encounter (HOSPITAL_COMMUNITY): Payer: Self-pay | Admitting: Internal Medicine

## 2022-08-04 DIAGNOSIS — E1065 Type 1 diabetes mellitus with hyperglycemia: Secondary | ICD-10-CM | POA: Diagnosis not present

## 2022-08-04 DIAGNOSIS — N179 Acute kidney failure, unspecified: Secondary | ICD-10-CM

## 2022-08-04 DIAGNOSIS — G8929 Other chronic pain: Secondary | ICD-10-CM

## 2022-08-04 DIAGNOSIS — F331 Major depressive disorder, recurrent, moderate: Secondary | ICD-10-CM | POA: Diagnosis not present

## 2022-08-04 DIAGNOSIS — M7918 Myalgia, other site: Secondary | ICD-10-CM

## 2022-08-04 DIAGNOSIS — Z609 Problem related to social environment, unspecified: Secondary | ICD-10-CM

## 2022-08-04 DIAGNOSIS — E1010 Type 1 diabetes mellitus with ketoacidosis without coma: Secondary | ICD-10-CM | POA: Diagnosis not present

## 2022-08-04 LAB — GLUCOSE, CAPILLARY
Glucose-Capillary: 125 mg/dL — ABNORMAL HIGH (ref 70–99)
Glucose-Capillary: 148 mg/dL — ABNORMAL HIGH (ref 70–99)
Glucose-Capillary: 148 mg/dL — ABNORMAL HIGH (ref 70–99)
Glucose-Capillary: 151 mg/dL — ABNORMAL HIGH (ref 70–99)
Glucose-Capillary: 159 mg/dL — ABNORMAL HIGH (ref 70–99)
Glucose-Capillary: 161 mg/dL — ABNORMAL HIGH (ref 70–99)
Glucose-Capillary: 163 mg/dL — ABNORMAL HIGH (ref 70–99)
Glucose-Capillary: 172 mg/dL — ABNORMAL HIGH (ref 70–99)
Glucose-Capillary: 172 mg/dL — ABNORMAL HIGH (ref 70–99)
Glucose-Capillary: 174 mg/dL — ABNORMAL HIGH (ref 70–99)
Glucose-Capillary: 181 mg/dL — ABNORMAL HIGH (ref 70–99)
Glucose-Capillary: 191 mg/dL — ABNORMAL HIGH (ref 70–99)
Glucose-Capillary: 193 mg/dL — ABNORMAL HIGH (ref 70–99)
Glucose-Capillary: 194 mg/dL — ABNORMAL HIGH (ref 70–99)
Glucose-Capillary: 196 mg/dL — ABNORMAL HIGH (ref 70–99)
Glucose-Capillary: 200 mg/dL — ABNORMAL HIGH (ref 70–99)
Glucose-Capillary: 210 mg/dL — ABNORMAL HIGH (ref 70–99)
Glucose-Capillary: 224 mg/dL — ABNORMAL HIGH (ref 70–99)
Glucose-Capillary: 225 mg/dL — ABNORMAL HIGH (ref 70–99)
Glucose-Capillary: 231 mg/dL — ABNORMAL HIGH (ref 70–99)
Glucose-Capillary: 245 mg/dL — ABNORMAL HIGH (ref 70–99)
Glucose-Capillary: 248 mg/dL — ABNORMAL HIGH (ref 70–99)

## 2022-08-04 LAB — BASIC METABOLIC PANEL
Anion gap: 14 (ref 5–15)
BUN: <5 mg/dL — ABNORMAL LOW (ref 6–20)
CO2: 16 mmol/L — ABNORMAL LOW (ref 22–32)
Calcium: 8.6 mg/dL — ABNORMAL LOW (ref 8.9–10.3)
Chloride: 106 mmol/L (ref 98–111)
Creatinine, Ser: 0.76 mg/dL (ref 0.44–1.00)
GFR, Estimated: >60 mL/min (ref >60)
Glucose, Bld: 250 mg/dL — ABNORMAL HIGH (ref 70–99)
Potassium: 4.9 mmol/L (ref 3.5–5.1)
Sodium: 136 mmol/L (ref 135–145)

## 2022-08-04 MED ORDER — POTASSIUM CHLORIDE CRYS ER 20 MEQ PO TBCR
40.0000 meq | EXTENDED_RELEASE_TABLET | ORAL | Status: AC
  Administered 2022-08-04 (×2): 40 meq via ORAL
  Filled 2022-08-04 (×2): qty 2

## 2022-08-04 MED ORDER — POTASSIUM CHLORIDE 10 MEQ/100ML IV SOLN
10.0000 meq | INTRAVENOUS | Status: AC
  Administered 2022-08-04 (×2): 10 meq via INTRAVENOUS
  Filled 2022-08-04 (×2): qty 100

## 2022-08-04 MED ORDER — INSULIN REGULAR NEW PEDIATRIC IV INFUSION >5 KG - SIMPLE MED: 0.1000 [IU]/kg/h | INTRAVENOUS | Status: DC

## 2022-08-04 MED ORDER — POTASSIUM CHLORIDE 10 MEQ/100ML IV SOLN
10.0000 meq | INTRAVENOUS | Status: AC
  Administered 2022-08-04 (×4): 10 meq via INTRAVENOUS
  Filled 2022-08-04 (×4): qty 100

## 2022-08-04 MED ORDER — BACLOFEN 10 MG PO TABS
5.0000 mg | ORAL_TABLET | Freq: Three times a day (TID) | ORAL | Status: DC
  Administered 2022-08-04 – 2022-08-05 (×3): 5 mg via ORAL
  Filled 2022-08-04 (×3): qty 1

## 2022-08-04 MED ORDER — GABAPENTIN 100 MG PO CAPS
100.0000 mg | ORAL_CAPSULE | Freq: Three times a day (TID) | ORAL | Status: DC
  Administered 2022-08-04 – 2022-08-05 (×3): 100 mg via ORAL
  Filled 2022-08-04 (×3): qty 1

## 2022-08-04 MED ORDER — INSULIN PUMP
Freq: Three times a day (TID) | SUBCUTANEOUS | Status: DC
  Filled 2022-08-04: qty 1

## 2022-08-04 MED ORDER — DEXTROSE 50 % IV SOLN: 0.0000 mL | INTRAVENOUS | Status: DC | PRN

## 2022-08-04 MED ORDER — TRAMADOL HCL 50 MG PO TABS
50.0000 mg | ORAL_TABLET | Freq: Four times a day (QID) | ORAL | Status: DC | PRN
  Administered 2022-08-04 – 2022-08-05 (×3): 50 mg via ORAL
  Filled 2022-08-04 (×3): qty 1

## 2022-08-04 MED ORDER — INSULIN REGULAR(HUMAN) IN NACL 100-0.9 UT/100ML-% IV SOLN
INTRAVENOUS | Status: DC
  Administered 2022-08-04: 2.8 [IU]/h via INTRAVENOUS
  Filled 2022-08-04: qty 100

## 2022-08-04 NOTE — Consult Note (Signed)
  Psychiatry consult placed for DKA, possible depression issues.  Chart review shows patient has multiple life stressors and psychosocial challenges that are impacting her ability to properly manage her medical conditions.  She also expresses situational depression related to her inability to properly manage insulin use, due to current food securities. There do not appear to be any acute psychiatric conditions such as suicidal ideation, self-harm, or using insulin management as a means of suicide. She clearly states that she rations her insulin due to a lack of adequate food in the home and others consuming the food she buys. She would benefit from a TOC consult to address her social determinants of health (SDOH). Her current level of situational depression seems suitable for outpatient psychiatric care.  -If her condition changes or she endorses any acute safety risks, please let us know or reconsult accordingly.  - will update AVS to reflect outpatient services at [facility].   -Consider [facility] consult for psychosocial stressors, possible food pantries/shelters, internal medicine clinic/pharmacy program that provides appropriate insulin. -Agree with current recommendation for diabetes coordinator. -Continue Lexapro 10 mg p.o. daily for depression  Will discontinue psychiatric consult at this time.  Primary team has been made aware.

## 2022-08-04 NOTE — TOC Progression Note (Addendum)
Transition of Care [facility]) - Progression Note    Patient Details  Name: Dawn [name] MRN: [id number] Date of Birth: 11-03-2000  Transition [facility] CM/SW Contact  Graves-Bigelow, Lamar Laundry, RN Phone Number: 08/04/2022, 4:07 PM  Clinical Narrative:  Case Manager spoke with patient regarding PCP needs. Patient stated she is not able to see the provider from [facility] with new insurance. Patient has the new Medicaid [employer]. Patient asked for Family Medicine and the office is not accepting new patients. Patient is agreeable to an appointment with the Internal Medicine Clinic. Appointment scheduled and information submitted on the AVS. No further needs identified at this time.   Expected Discharge Plan: Home/Self Care Barriers to Discharge: No Barriers Identified  Expected Discharge Plan and Services In-house Referral: NA Discharge Planning Services: CM Consult, Follow-up appt scheduled   Living arrangements for the past 2 months: Single Family Home                   DME Agency: NA       HH Arranged: NA   Social Determinants of Health (SDOH) Interventions SDOH Screenings   Food Insecurity: No Food Insecurity (08/03/2022)  Housing: Low Risk  (08/03/2022)  Transportation Needs: No Transportation Needs (08/03/2022)  Utilities: Not At Risk (08/03/2022)  Depression (PHQ2-9): Low Risk  (01/06/2022)  Financial Resource Strain: High Risk (08/16/2021)  Physical Activity: Unknown (07/04/2018)  Social Connections: Unknown (05/05/2021)   [facility] from [facility]  Stress: Unknown (07/04/2018)  Tobacco Use: Medium Risk (08/04/2022)   Readmission Risk Interventions    03/07/2022   10:22 AM 12/19/2019    1:16 PM  Readmission Risk Prevention Plan  Transportation Screening Complete Complete  PCP or Specialist Appt within 3-5 Days  Complete  HRI or Home Care Consult  Complete  Social Work Consult for Recovery Care Planning/Counseling  Complete  Palliative Care Screening   Not Applicable  Medication Review Oceanographer) Complete Complete  PCP or Specialist appointment within 3-5 days of discharge Complete   HRI or Home Care Consult Complete   SW Recovery Care/Counseling Consult Complete   Palliative Care Screening Not Applicable   Skilled Nursing Facility Not Applicable

## 2022-08-04 NOTE — Consult Note (Signed)
   [facility] CM Inpatient Consult   08/04/2022  Dawn [name] [birth year] [id number]  [facility] Referral: on the banner  Primary Care Provider:Ilaiwy, Amro, MD showing as provider   Insurance issue:  recent insurance changes patient was listed with [employer] with [facility] new insurance not in [facility] contact, had a distinct past with [facility]/Managed Medicaid LCSW. Reviewed briefly.   Patient recently changed insurance plans and not listed with this plan for Care Management services at this time. May be a Managed Medicaid   Reason:  Current Insurance plan showing is not contacted with [facility]  For questions, please call:  Charlesetta Shanks, RN BSN CCM [facility]  [phone] business mobile phone Toll free office [phone]  *Concierge Line  [phone] Fax number: [phone] [email or web address]

## 2022-08-04 NOTE — Progress Notes (Addendum)
Endotool recommends transition. Obtained BMP that shows anion gap 12 and Potassium is 3.3. Paged MD for transition. No new orders received.   55- Paged MD again for orders to transition patient off of Endotool.   18- Spoke with MD that advised to continue Insulin gtt due to lab results and received order to replace potassium

## 2022-08-04 NOTE — Inpatient Diabetes Management (Signed)
Inpatient Diabetes Program Recommendations  AACE/ADA: New Consensus Statement on Inpatient Glycemic Control   Target Ranges:  Prepandial:   less than 140 mg/dL      Peak postprandial:   less than 180 mg/dL (1-2 hours)      Critically ill patients:  140 - 180 mg/dL    Review of Glycemic Control  Diabetes history: DM1 (does NOT make any insulin, requires basal, correction, and carb coverage insulin) Outpatient Diabetes medications: [name] 20 units daily, Humalog 3 units with meals plus correction, Omnipod insulin pump with humalog insulin Current orders for Inpatient glycemic control: IV insulin  Inpatient Diabetes Program Recommendations:    -   Novolog vial for refilling insulin pump -   Allow pt to eat now and can bolus with IV insulin gtt program or via pump if applied.  Spoke with pt at bedside regarding her home regimen. Pt reports using her Omnipod insulin pump with Humalog insulin. Pt reports being prescribed Humalog insulin pens and not vials. Pt reports getting her insulin Sunday. Pt has many stressors and difficult social situation that is contributing to her multiple admissions over the last 6 months.  For discharge will need Humalog vial for pump refill, she will call her Endocrinologist office for post hospital follow up and to set up an appointment for training on her new tandem insulin pump    Thanks, Christena Deem RN, MSN, BC-ADM Inpatient Diabetes Coordinator Team Pager [phone] (8a-5p)

## 2022-08-04 NOTE — Progress Notes (Signed)
CSW received consult for patient. CSW spoke with patient at bedside. Patient reports PTA she comes from home with mom. CSW offered patient food resources and psychiatry resources. Patient accepted. All questions answered. Patient reports her father will pick her up when medically ready for dc..Patient thanked CSW. No further questions reported at this time

## 2022-08-04 NOTE — Progress Notes (Signed)
PROGRESS NOTE    Dawn [name]  ZOX:[id number] DOB: 16-Oct-2000 DOA: 08/02/2022 PCP: Nadyne Coombes, MD    Brief Narrative:   Dawn [name] is a 22 y.o. female with medical history significant for ADHD/depression and T1DM presenting with emesis and "hurting all over." Poorly controlled DM, 5 admissions and 2 ER visits in the last 6 months.  She acknowledges not really taking insulin.  She had an insurance issue and has not had a pump, just got a day before presentation. She reports that she is trying to get her own place because the adults in her house are eating her food that she gets with food stamps, so she doesn't really give herself insulin due to her food insecurity.  She is sexually active and currently on Depo Provera, last shot about 2 months ago; she is hoping to get pregnant soon.  She reports diffuse pain and is frustrated that she hasn't been given pain medication or muscle relaxants as an outpatient, wants them here.  She recently got new insurance and needs a new PCP, doesn't want to go to [place].  She previously went to [facility] and would like to go back there.      Assessment and Plan:  DKA -Patient with poor baseline control (A1c 14.3 on 5/12). Reports food insecurity, insulin non-compliance.  She states that she is on insulin pump.  Diabetic coordinator on board.  Plan is to overlap insulin drip with pump and continuing with that.  Latest CO2 15.  Hypokalemia.  Will replace with 40 mEq of IV potassium followed by oral potassium.  On insulin drip.  Will closely monitor BMP every 4 hourly.   AKI likely secondary to DKA. Improved after hydration.  Creatinine today at 0.7.   Depression -Continue escitalopram   Diffuse muscle pain. -Has had this for a while now.  Encouraged her to discuss with her primary care physician to consider fibromyalgia and other problems causing this.  Continue APAP, methocarbamol, ketorolac.  Has history of depression.  Will try to avoid narcotics if  possible.   Poor social situation -Food insecurity.  TOC has been consulted.   DVT prophylaxis: enoxaparin (LOVENOX) injection 40 mg Start: 08/03/22 1030   Code Status:     Code Status: Full Code  Disposition: Home likely in 1 to 2 days  Status is: Inpatient Remains inpatient appropriate because: DKA, on insulin drip, poor social support and situation, TOC on board.   Family Communication: None  Consultants:  None  Procedures:  None  Antimicrobials:  None  Anti-infectives (From admission, onward)    None      Subjective: Today, patient was seen and examined at bedside.  Patient complains of generalized body pain.  No nausea, vomiting, wants to eat something.  Did walk to the bathroom.  Complains of neuropathy in her legs.  Objective: Vitals:   08/03/22 2347 08/04/22 0604 08/04/22 0842 08/04/22 1208  BP: 114/80 116/77 (!) 125/97 (!) 128/93  Pulse: (!) 107 (!) 102 100 (!) 18  Resp: 18 18 16 18   Temp: 98.2 F (36.8 C) 98.6 F (37 C) 98.5 F (36.9 C) 98.8 F (37.1 C)  TempSrc: Oral Oral Oral Oral  SpO2: 99% 100% 100% 100%  Weight:      Height:        Intake/Output Summary (Last 24 hours) at 08/04/2022 1429 Last data filed at 08/04/2022 1400 Gross per 24 hour  Intake 2231.81 ml  Output 2400 ml  Net -168.19 ml   [employer]  08/02/22 2148  Weight: 48.1 kg    Physical Examination: Body mass index is 20.7 kg/m.   General:  Average built, not in obvious distress HENT:   No scleral pallor or icterus noted. Oral mucosa is moist.  Chest:  Clear breath sounds.  Diminished breath sounds bilaterally. No crackles or wheezes.  CVS: S1 &S2 heard. No murmur.  Regular rate and rhythm. Abdomen: Soft, nontender, nondistended.  Bowel sounds are heard.   Extremities: No cyanosis, clubbing or edema.  Peripheral pulses are palpable. Psych: Alert, awake and oriented, flat affect. CNS:  No cranial nerve deficits.  Power equal in all extremities.   Skin: Warm and dry.   No rashes noted.  Data Reviewed:   CBC: Recent Labs  Lab 08/02/22 2221 08/03/22 0046 08/03/22 0350  WBC 10.7*  --   --   NEUTROABS 6.5  --   --   HGB 12.8 12.9 13.3  HCT 45.2 38.0 39.0  MCV 92.2  --   --   PLT 392  --   --     Basic Metabolic Panel: Recent Labs  Lab 08/03/22 1017 08/03/22 1643 08/03/22 2132 08/04/22 0119 08/04/22 0751  NA 138 136 139 134* 136  K 3.9 3.4* 3.3* 3.2* 3.1*  CL 110 107 111 108 110  CO2 10* 13* 16* 14* 15*  GLUCOSE 218* 205* 141* 178* 167*  BUN 13 11 9 9 6   CREATININE 1.11* 1.01* 0.78 0.76 0.72  CALCIUM 8.8* 8.9 8.9 8.8* 8.4*    Liver Function Tests: Recent Labs  Lab 08/02/22 2221  AST 25  ALT 16  ALKPHOS 124  BILITOT 1.4*  PROT 8.6*  ALBUMIN 4.3     Radiology Studies: No results found.    LOS: 1 day    Joycelyn Das, MD [facility] Available via Epic secure chat 7am-7pm After these hours, please refer to coverage provider listed on [email or web address] 08/04/2022, 2:29 PM

## 2022-08-04 NOTE — Progress Notes (Signed)
Initial Nutrition Assessment  DOCUMENTATION CODES:   Not applicable  INTERVENTION:  Patient declined edu   Encouraged po intake   Encouraged her to stay on top of meal times and insulin regimen   Encouraged patient to restart prenatal vitamin    NUTRITION DIAGNOSIS:   Altered nutrition lab value related to chronic illness as evidenced by other (comment) (DKA).   GOAL:   Patient will meet greater than or equal to 90% of their needs   MONITOR:   PO intake, Skin, I & O's, Labs, Weight trends  REASON FOR ASSESSMENT:   Consult Assessment of nutrition requirement/status, Diet education  ASSESSMENT:   22 y.o. female with PMHx including ADHD/depression and T1DM presents with emesis and "hurting all over. Patient admitted for DKA.  Visited patient at bedside who had just finished eating lunch. Patient had chicken tenders and french fries. She reports eating 3 meals per day and knowing how to administer her insulin correctly. Patient reports she hopes to get pregnant soon and has been taking a prenatal pill before she ran out.   Patient reports she knows how to control her blood glucose and that she does not need diet edu. RD asked if her weight loss was related to anything and she denied losing weight. She seemed a little guarded during her interview, but she could just be shy?   MD notes reviewed:   Poorly controlled DM, not taking insulin as she should due to food insecurity. She did not get her glucose monitor until yesterday. RD is hoping that she can monitor glucose levels better now.   Patient denies N/V/D/C, trouble chewing or swallowing.   Labs: K+ 3.1, Glu 167 Meds: lexapro, insulin pump, KCl, D5 LR @125   Wt: 4.6 kg (8.7%) wt loss x 6 months  08/02/22 48.1 kg  07/14/22 49.4 kg  05/18/22 49.4 kg  03/27/22 47.6 kg  03/25/22 49.7 kg  03/06/22 49.7 kg  02/17/22 52.7 kg  01/06/22 52.7 kg   PO: patient NPO   I/O's:  +2.6 L since admission   NUTRITION -  FOCUSED PHYSICAL EXAM:  Flowsheet Row Most Recent Value  Orbital Region No depletion  Upper Arm Region No depletion  Thoracic and Lumbar Region Unable to assess  Buccal Region No depletion  Temple Region No depletion  Clavicle Bone Region No depletion  Clavicle and Acromion Bone Region No depletion  Scapular Bone Region Unable to assess  Dorsal Hand No depletion  Patellar Region No depletion  Anterior Thigh Region No depletion  Posterior Calf Region No depletion  Edema (RD Assessment) None  Hair Reviewed  Eyes Reviewed  Mouth Reviewed  Skin Reviewed  Nails Reviewed       Diet Order:   Diet Order             Diet Carb Modified Fluid consistency: Thin; Room service appropriate? Yes  Diet effective now                   EDUCATION NEEDS:   Education needs have been addressed  Skin:  Skin Assessment: Reviewed RN Assessment  Last BM:  7/30  Height:   Ht Readings from Last 1 Encounters:  08/02/22 5' (1.524 m)    Weight:   Wt Readings from Last 1 Encounters:  08/02/22 48.1 kg    Ideal Body Weight:     BMI:  Body mass index is 20.7 kg/m.  Estimated Nutritional Needs:   Kcal:  1200-1500  Protein:  60-75 g  Fluid:  > 1.5 L    Leodis Rains, RDN, LDN  Clinical Nutrition

## 2022-08-04 NOTE — Patient Outreach (Signed)
Removed from patient care team.  Gus Puma, BSW, [facility]  Managed [facility] Social Worker [phone]

## 2022-08-05 DIAGNOSIS — M7918 Myalgia, other site: Secondary | ICD-10-CM | POA: Diagnosis not present

## 2022-08-05 DIAGNOSIS — N179 Acute kidney failure, unspecified: Secondary | ICD-10-CM | POA: Diagnosis not present

## 2022-08-05 DIAGNOSIS — F331 Major depressive disorder, recurrent, moderate: Secondary | ICD-10-CM | POA: Diagnosis not present

## 2022-08-05 DIAGNOSIS — E0810 Diabetes mellitus due to underlying condition with ketoacidosis without coma: Secondary | ICD-10-CM

## 2022-08-05 LAB — BASIC METABOLIC PANEL
Anion gap: 8 (ref 5–15)
BUN: <5 mg/dL — ABNORMAL LOW (ref 6–20)
CO2: 23 mmol/L (ref 22–32)
Calcium: 9.1 mg/dL (ref 8.9–10.3)
Chloride: 108 mmol/L (ref 98–111)
Creatinine, Ser: 0.55 mg/dL (ref 0.44–1.00)
GFR, Estimated: >60 mL/min (ref >60)
Glucose, Bld: 152 mg/dL — ABNORMAL HIGH (ref 70–99)
Potassium: 3.4 mmol/L — ABNORMAL LOW (ref 3.5–5.1)
Sodium: 139 mmol/L (ref 135–145)

## 2022-08-05 LAB — GLUCOSE, CAPILLARY
Glucose-Capillary: 125 mg/dL — ABNORMAL HIGH (ref 70–99)
Glucose-Capillary: 143 mg/dL — ABNORMAL HIGH (ref 70–99)
Glucose-Capillary: 154 mg/dL — ABNORMAL HIGH (ref 70–99)
Glucose-Capillary: 164 mg/dL — ABNORMAL HIGH (ref 70–99)
Glucose-Capillary: 177 mg/dL — ABNORMAL HIGH (ref 70–99)
Glucose-Capillary: 186 mg/dL — ABNORMAL HIGH (ref 70–99)
Glucose-Capillary: 211 mg/dL — ABNORMAL HIGH (ref 70–99)
Glucose-Capillary: 211 mg/dL — ABNORMAL HIGH (ref 70–99)
Glucose-Capillary: 230 mg/dL — ABNORMAL HIGH (ref 70–99)
Glucose-Capillary: 241 mg/dL — ABNORMAL HIGH (ref 70–99)

## 2022-08-05 NOTE — Plan of Care (Signed)

## 2022-08-05 NOTE — Discharge Summary (Signed)
Physician Discharge Summary  Dawn [name] ZOX:[id number] DOB: 10-18-2000 DOA: 08/02/2022  PCP: Dawn Dash, DO  Admit date: 08/02/2022 Discharge date: 08/05/2022  Admitted From: Home  Discharge disposition: Home   Recommendations for Outpatient Follow-Up:   Follow up with your primary care provider in one week.  Check CBC, BMP, magnesium in the next visit Follow-up with your endocrinology in 1 to 2 weeks.   Discharge Diagnosis:   Principal Problem:   DKA (diabetic ketoacidosis) (HCC) Active Problems:   Poorly controlled type 1 diabetes mellitus (HCC)   MDD (major depressive disorder), recurrent episode, moderate (HCC)   Poor social situation   AKI (acute kidney injury) (HCC)   Chronic musculoskeletal pain    Discharge Condition: Improved.  Diet recommendation: Carbohydrate-modified.    Wound care: None.  Code status: Full.   History of Present Illness:   Dawn [name] is a 22 y.o. female with medical history significant for ADHD/depression and T1DM presenting with emesis and "hurting all over." Poorly controlled DM, 5 admissions and 2 ER visits in the last 6 months.  She acknowledges not really taking insulin.  She had an insurance issue and has not had a pump, just got a day before presentation. She reports that she is trying to get her own place because the adults in her house are eating her food that she gets with food stamps, so she doesn't really give herself insulin due to her food insecurity.    She recently got new insurance and needs a new PCP.  Patient was then admitted hospital for further evaluation and treatment. Hospital Course:   Following conditions were addressed during hospitalization as listed below,  DKA -Patient with poor baseline control (A1c 14.3 on 5/12). Reports food insecurity, insulin non-compliance.  She states that she is on insulin pump.  Diabetic coordinator saw the patient during hospitalization at this time patient will resume  insulin drip on discharge.  Patient stated that she does have all the necessary supplies at home at this time.    Hypokalemia.  Replenished during hospitalization.  AKI likely secondary to DKA. Improved after hydration.  Creatinine today at 0.5.   Depression -Continue escitalopram.  Seen by psychiatry as well.  Recommend addressing outpatient stressors and continuation of Lexapro.   Diffuse muscle pain. -Has had this for a while now.  Encouraged her to discuss with her primary care physician to consider fibromyalgia and other problems causing this.   Has history of depression.  Will continue Lexapro on discharge.    Poor social situation -Food insecurity.  TOC has been consulted and has provided resources.  Disposition.  At this time, patient is stable for disposition home with outpatient PCP and endocrinology follow-up.  Medical Consultants:   None.  Procedures:    None Subjective:   Today, patient was seen and examined at bedside.  No nausea vomiting fever chills or rigor.  Feels okay.  Wanting to go home.  Discharge Exam:   Vitals:   08/04/22 1927 08/05/22 0814  BP: (!) 128/98 (!) 137/105  Pulse: (!) 105 (!) 104  Resp: 20 16  Temp: 98.6 F (37 C) 98.2 F (36.8 C)  SpO2: 100% 97%   Vitals:   08/04/22 1208 08/04/22 1633 08/04/22 1927 08/05/22 0814  BP: (!) 128/93 (!) 125/92 (!) 128/98 (!) 137/105  Pulse: (!) 18 97 (!) 105 (!) 104  Resp: 18 18 20 16   Temp: 98.8 F (37.1 C) 98.6 F (37 C) 98.6 F (37 C) 98.2 F (36.8  C)  TempSrc: Oral Oral Oral Oral  SpO2: 100% 100% 100% 97%  Weight:      Height:       Body mass index is 20.7 kg/m.  General: Alert awake, not in obvious distress HENT: pupils equally reacting to light,  No scleral pallor or icterus noted. Oral mucosa is moist.  Chest:  Clear breath sounds.   No crackles or wheezes.  CVS: S1 &S2 heard. No murmur.  Regular rate and rhythm. Abdomen: Soft, nontender, nondistended.  Bowel sounds are heard.    Extremities: No cyanosis, clubbing or edema.  Peripheral pulses are palpable. Psych: Alert, awake and oriented, normal mood CNS:  No cranial nerve deficits.  Power equal in all extremities.   Skin: Warm and dry.  No rashes noted.  The results of significant diagnostics from this hospitalization (including imaging, microbiology, ancillary and laboratory) are listed below for reference.     Diagnostic Studies:   No results found.   Labs:   Basic Metabolic Panel: Recent Labs  Lab 08/03/22 2132 08/04/22 0119 08/04/22 0751 08/04/22 1518 08/05/22 0809  NA 139 134* 136 136 139  K 3.3* 3.2* 3.1* 4.9 3.4*  CL 111 108 110 106 108  CO2 16* 14* 15* 16* 23  GLUCOSE 141* 178* 167* 250* 152*  BUN 9 9 6  <5* <5*  CREATININE 0.78 0.76 0.72 0.76 0.55  CALCIUM 8.9 8.8* 8.4* 8.6* 9.1   GFR Estimated Creatinine Clearance: 79.9 mL/min (by C-G formula based on SCr of 0.55 mg/dL). Liver Function Tests: Recent Labs  Lab 08/02/22 2221  AST 25  ALT 16  ALKPHOS 124  BILITOT 1.4*  PROT 8.6*  ALBUMIN 4.3   No results for input(s): "LIPASE", "AMYLASE" in the last 168 hours. No results for input(s): "AMMONIA" in the last 168 hours. Coagulation profile No results for input(s): "INR", "PROTIME" in the last 168 hours.  CBC: Recent Labs  Lab 08/02/22 2221 08/03/22 0046 08/03/22 0350  WBC 10.7*  --   --   NEUTROABS 6.5  --   --   HGB 12.8 12.9 13.3  HCT 45.2 38.0 39.0  MCV 92.2  --   --   PLT 392  --   --    Cardiac Enzymes: No results for input(s): "CKTOTAL", "CKMB", "CKMBINDEX", "TROPONINI" in the last 168 hours. BNP: Invalid input(s): "POCBNP" CBG: Recent Labs  Lab 08/05/22 0545 08/05/22 0631 08/05/22 0734 08/05/22 0835 08/05/22 0959  GLUCAP 211* 177* 186* 143* 154*   D-Dimer No results for input(s): "DDIMER" in the last 72 hours. Hgb A1c No results for input(s): "HGBA1C" in the last 72 hours. Lipid Profile No results for input(s): "CHOL", "HDL", "LDLCALC", "TRIG",  "CHOLHDL", "LDLDIRECT" in the last 72 hours. Thyroid function studies No results for input(s): "TSH", "T4TOTAL", "T3FREE", "THYROIDAB" in the last 72 hours.  Invalid input(s): "FREET3" Anemia work up No results for input(s): "VITAMINB12", "FOLATE", "FERRITIN", "TIBC", "IRON", "RETICCTPCT" in the last 72 hours. Microbiology No results found for this or any previous visit (from the past 240 hour(s)).   Discharge Instructions:   Discharge Instructions     Call MD for:  persistant nausea and vomiting   Complete by: As directed    Call MD for:  severe uncontrolled pain   Complete by: As directed    Diet Carb Modified   Complete by: As directed    Discharge instructions   Complete by: As directed    Start insulin pump as soon as you reach home. Follow up with  your endocrinology doctor when scheduled by you or in 2-3 weeks.. Follow up with your primary care provider in one week. Seek medical attention for worsening symptoms.   Increase activity slowly   Complete by: As directed       Allergies as of 08/05/2022       Reactions   Latex Itching, Swelling, Other (See Comments)   Skin irritation   Tape Other (See Comments)   Skin irritation   Shellfish Allergy Itching, Rash   Scallops specifically        Medication List     STOP taking these medications    BD Pen Needle Nano 2nd Gen 32G X 4 MM Misc Generic drug: Insulin Pen Needle   ibuprofen 600 MG tablet Commonly known as: ADVIL   insulin degludec 100 UNIT/ML FlexTouch Pen Commonly known as: Special educational needs teacher Misc   Lantus SoloStar 100 UNIT/ML Solostar Pen Generic drug: insulin glargine   medroxyPROGESTERone 150 MG/ML injection Commonly known as: DEPO-PROVERA   Pen Needles 31G X 5 MM Misc       TAKE these medications    Accu-Chek Guide w/Device Kit Use 3 (three) times daily before meals.   Blood Glucose Monitoring Suppl Devi 1 each by Does not apply route 3 (three) times daily. May  dispense any manufacturer covered by patient's insurance.   Baqsimi Two Pack 3 MG/DOSE Powd Generic drug: Glucagon Place 3 mg into the nose as needed (low blood sugar).   Gvoke HypoPen 2-Pack 1 MG/0.2ML Soaj Generic drug: Glucagon Inject 1 mg into the skin as needed for up to 2 doses (Severe low blood sugar).   BLOOD GLUCOSE TEST STRIPS Strp 1 each by Does not apply route 3 (three) times daily. Use as directed to check blood sugar. May dispense any manufacturer covered by patient's insurance and fits patient's device.   Dexcom G7 Sensor Misc 1 Application by Does not apply route as directed.   escitalopram 10 MG tablet Commonly known as: LEXAPRO Take 10 mg by mouth daily.   insulin lispro 100 UNIT/ML KwikPen Commonly known as: HUMALOG Inject 3 Units into the skin 3 (three) times daily with meals. If eating and Blood Glucose (BG) 80 or higher inject 3 units for meal coverage and add correction dose per scale. If not eating, correction dose only. BG <150= 0 unit; BG 150-200= 1 unit; BG 201-250= 2 unit; BG 251-300= 3 unit; BG 301-350= 4 unit; BG 351-400= 5 unit; BG >400= 6 unit and Call Primary Care. What changed: how much to take   SUMAtriptan 25 MG tablet Commonly known as: Imitrex Take 0.5 tablets (12.5 mg total) by mouth every 2 (two) hours as needed for migraine. May repeat in 2 hours if headache persists or recurs.   tiZANidine 4 MG tablet Commonly known as: ZANAFLEX Take 4 mg by mouth every 6 (six) hours as needed for muscle spasms.   Vitamin D (Ergocalciferol) 1.25 MG (50000 UNIT) Caps capsule Commonly known as: DRISDOL Take 50,000 Units by mouth every 7 (seven) days.        Follow-up Information     [facility] Follow up.   Specialty: Urgent Care Contact information: [address] [place] [address] [phone]                 Time coordinating discharge: 39 minutes  Signed:     [facility] 08/05/2022, 4:12 PM

## 2022-08-08 ENCOUNTER — Telehealth: Payer: Self-pay

## 2022-08-08 NOTE — Transitions of Care (Post Inpatient/ED Visit) (Signed)
08/08/2022  Name: Dawn [name] MRN: [id number] DOB: 2000-09-03  Today's TOC FU Call Status: Today's TOC FU Call Status:: Successful TOC FU Call Completed  Transition Care Management Follow-up Telephone Call Date of Discharge: 08/05/22 Discharge Facility: [facility]) Type of Discharge: Inpatient Admission Primary Inpatient Discharge Diagnosis:: Diabetic Ketoacidosis How have you been since you were released from the hospital?: Better Any questions or concerns?: Yes Patient Questions/Concerns:: Patient notes she was on Baclofen and Gabapentin in the hospital for body aches and she was not dc'd on them Patient Questions/Concerns Addressed: Notified Provider of Patient Questions/Concerns  Items Reviewed: Did you receive and understand the discharge instructions provided?: Yes Medications obtained,verified, and reconciled?: Partial Review Completed Reason for Partial Mediation Review: Discussed Insulin pump and dexcom Any new allergies since your discharge?: No Dietary orders reviewed?: Yes Type of Diet Ordered:: Carbohydrate controlled/Diabetic Do you have support at home?: Yes People in Home: other relative(s) Name of Support/Comfort Primary Source: [name]  Medications Reviewed Today: Medications Reviewed Today     Reviewed by Jodelle Gross, RN (Case Manager) on 08/08/22 at 1341  Med List Status: <None>   Medication Order Taking? Sig Documenting Provider Last Dose Status Informant  Blood Glucose Monitoring Suppl (ACCU-CHEK GUIDE ME) w/Device KIT [id number]  Use 3 (three) times daily before meals. Bess Kinds, MD  Active Self, Pharmacy Records  Blood Glucose Monitoring Suppl DEVI [id number]  1 each by Does not apply route 3 (three) times daily. May dispense any manufacturer covered by patient's insurance. Noralee Stain, DO  Active Self, Pharmacy Records  Continuous Glucose Sensor (DEXCOM G7 SENSOR) [place] [id number] Yes 1 Application by Does not apply route as directed.  [provider] Taking Active Self, Pharmacy Records  escitalopram (LEXAPRO) 10 MG tablet [id number] Yes Take 10 mg by mouth daily. [provider] Taking Active Self, Pharmacy Records  Glucagon (BAQSIMI TWO PACK) 3 MG/DOSE POWD [id number]  Place 3 mg into the nose as needed (low blood sugar). [provider]  Active Self, Pharmacy Records  Glucagon (GVOKE HYPOPEN 2-PACK) 1 MG/0.2ML SOAJ [id number]  Inject 1 mg into the skin as needed for up to 2 doses (Severe low blood sugar). Noralee Stain, DO  Active Self, Pharmacy Records  Glucose Blood (BLOOD GLUCOSE TEST STRIPS) STRP [id number]  1 each by Does not apply route 3 (three) times daily. Use as directed to check blood sugar. May dispense any manufacturer covered by patient's insurance and fits patient's device. Noralee Stain, DO  Active Self, Pharmacy Records  insulin lispro (HUMALOG) 100 UNIT/ML KwikPen [id number] Yes Inject 3 Units into the skin 3 (three) times daily with meals. If eating and Blood Glucose (BG) 80 or higher inject 3 units for meal coverage and add correction dose per scale. If not eating, correction dose only. BG <150= 0 unit; BG 150-200= 1 unit; BG 201-250= 2 unit; BG 251-300= 3 unit; BG 301-350= 4 unit; BG 351-400= 5 unit; BG >400= 6 unit and Call Primary Care.  Patient taking differently: Inject 0-6 Units into the skin 3 (three) times daily with meals. If eating and Blood Glucose (BG) 80 or higher inject 3 units for meal coverage and add correction dose per scale. If not eating, correction dose only. BG <150= 0 unit; BG 150-200= 1 unit; BG 201-250= 2 unit; BG 251-300= 3 unit; BG 301-350= 4 unit; BG 351-400= 5 unit; BG >400= 6 unit and Call Primary Care.   Noralee Stain, DO Taking Active Self, Pharmacy Records  SUMAtriptan (IMITREX) 25 MG tablet  [id number]  Take 0.5 tablets (12.5 mg total) by mouth every 2 (two) hours as needed for migraine. May repeat in 2 hours if headache persists or recurs. Dana Allan, MD   Active Self, Pharmacy Records  tiZANidine (ZANAFLEX) 4 MG tablet [id number]  Take 4 mg by mouth every 6 (six) hours as needed for muscle spasms. [provider]  Active Self, Pharmacy Records  Vitamin D, Ergocalciferol, (DRISDOL) 1.25 MG (50000 UNIT) CAPS capsule [id number]  Take 50,000 Units by mouth every 7 (seven) days. [provider]  Active Self, Pharmacy Records            Home Care and Equipment/Supplies: Were Home Health Services Ordered?: No Any new equipment or medical supplies ordered?: No  Functional Questionnaire: Do you need assistance with bathing/showering or dressing?: No Do you need assistance with meal preparation?: No Do you need assistance with eating?: No Do you have difficulty maintaining continence: No Do you need assistance with getting out of bed/getting out of a chair/moving?: No Do you have difficulty managing or taking your medications?: No  Follow up appointments reviewed: PCP Follow-up appointment confirmed?: Yes Date of PCP follow-up appointment?: 08/12/22 Follow-up Provider: Dr. Alois Cliche Specialist [facility] Follow-up appointment confirmed?: NA Do you need transportation to your follow-up appointment?: No Do you understand care options if your condition(s) worsen?: Yes-patient verbalized understanding  SDOH Interventions Today    Flowsheet Row Most Recent Value  SDOH Interventions   Utilities Interventions Intervention Not Indicated      Interventions Today    Flowsheet Row Most Recent Value  Chronic Disease   Chronic disease during today's visit Diabetes  General Interventions   General Interventions Discussed/Reviewed Referral to Nurse, General Interventions Discussed  [Patient agreed to have Managed Medicaid RNCC call her for DM management]       TOC Interventions Today    Flowsheet Row Most Recent Value  TOC Interventions   TOC Interventions Discussed/Reviewed TOC Interventions Discussed, TOC Interventions  Reviewed, Contacted provider for patient needs  [facility] sent to PCP regarding patients medications for body aches (Baclofen and Gabapentin)]     Jodelle Gross, RN, BSN, CCM Care Management Coordinator [place] Phone: [phone]/Fax: [phone]

## 2022-08-12 ENCOUNTER — Other Ambulatory Visit: Payer: Self-pay

## 2022-08-12 ENCOUNTER — Ambulatory Visit (INDEPENDENT_AMBULATORY_CARE_PROVIDER_SITE_OTHER): Payer: MEDICAID | Admitting: Student

## 2022-08-12 ENCOUNTER — Encounter: Payer: Self-pay | Admitting: *Deleted

## 2022-08-12 ENCOUNTER — Encounter: Payer: Self-pay | Admitting: Student

## 2022-08-12 ENCOUNTER — Ambulatory Visit (HOSPITAL_COMMUNITY)
Admission: RE | Admit: 2022-08-12 | Discharge: 2022-08-12 | Disposition: A | Discharge status: Home / Self Care | Payer: MEDICAID | Source: Ambulatory Visit | Attending: Family Medicine | Admitting: Family Medicine

## 2022-08-12 ENCOUNTER — Other Ambulatory Visit: Payer: MEDICAID | Admitting: *Deleted

## 2022-08-12 VITALS — BP 97/74 | HR 141 | Ht 60.0 in | Wt 101.0 lb

## 2022-08-12 DIAGNOSIS — R9431 Abnormal electrocardiogram [ECG] [EKG]: Secondary | ICD-10-CM | POA: Diagnosis not present

## 2022-08-12 DIAGNOSIS — R000 Tachycardia, unspecified: Secondary | ICD-10-CM | POA: Insufficient documentation

## 2022-08-12 DIAGNOSIS — E1065 Type 1 diabetes mellitus with hyperglycemia: Secondary | ICD-10-CM | POA: Diagnosis not present

## 2022-08-12 DIAGNOSIS — E1010 Type 1 diabetes mellitus with ketoacidosis without coma: Secondary | ICD-10-CM

## 2022-08-12 LAB — GLUCOSE, POCT (MANUAL RESULT ENTRY)
POC Glucose: 63 mg/dl — AB (ref 70–99)
POC Glucose: 88 mg/dl (ref 70–99)

## 2022-08-12 NOTE — Progress Notes (Cosign Needed)
SUBJECTIVE:   CHIEF COMPLAINT / HPI:   Hospital follow-up for DKA Admitted 7/30-8/2.  She has had 5 admissions and 2 ER visits in the past 6 months for this. She is unsure of her insulin regimen.  She does not know what type of insulin is in her pump.  This morning, her CGM read "high" and had read high for the entire evening while she was asleep.  When she woke up at 6 AM, she vomited and at 8 AM gave herself 40 units of Lantus (reviewed endocrinology note which states she should be using 24 units long-acting insulin daily with sliding scale short acting insulin with mealtime, and encouraged to eat 3 meals a day).  She did not eat anything today.  She threw up 3 more times after initial episode of vomiting.  When she arrived to the clinic, her CBG was 2 and she was shaky and feeling unwell.  She has not had an endocrinologist for the past year, but did recently establish care with endocrinology at [facility].  She knows she needs to have a follow-up appointment with them, but does not know their phone number.  She would like a referral to an ophthalmologist because she has blurry vision at times, which she notices more when she is hypoglycemic.  Healthcare maintenance needs Needs Pap smear completed.  PERTINENT  PMH / PSH: T1DM, frequent admissions for DKA, diffuse myalgias, housing insecurity, food insecurity  OBJECTIVE:   BP 97/74   Pulse (!) 141   Ht 5' (1.524 m)   Wt 101 lb (45.8 kg)   LMP 07/05/2022 (Approximate)   SpO2 96%   BMI 19.73 kg/m   General: Generally well-appearing, thin HEENT: Dry mucous membranes CV: Tachycardic to 140s, regular rhythm Respiratory: Normal work of breathing on room air.  Lungs clear in all fields. Extremities: Warm and well-perfused Skin: Initially bit diaphoretic, but improved at end of visit  ASSESSMENT/PLAN:   Poorly controlled type 1 diabetes mellitus (HCC) Point-of-care CBG 63 on arrival, improved to 88 with Molli Posey protein  shake that had 20 g of sugar in it. Advised patient to go to the ER as I am concerned she may have another episode of DKA given hyperglycemia with vomiting, and general feeling of unwellness with tachycardia and signs of dehydration on exam.  She and her dad agreed to go. -Patient to go to the ED for evaluation for possible DKA versus dehydration and need for IV fluids. Well-appearing thus did not feel EMS transport necessary, Father agreed to drive her to ER. -I am pleased her father was at the visit today, as she needs more guidance and support with caring for her T1DM.  He asked to be a proxy on her MyChart, which I think is a great idea -We had a long discussion about her insulin regimen and dire need for further diabetes education.  She seems bright and she is engaging in our visit and wants to take better care of herself to feel better. -I placed an order for community care coordination to help with housing and food instability, as well as transportation barriers to appointments -Will call her over the weekend or Monday to follow-up and see how she is doing.  Ideally, we will have frequent and regular office visits  Sinus tachycardia Heart rate in the 130s-140s today. EKG normal sinus rhythm, no evidence of arrhythmia Likely due to hypovolemia/dehydration   Follow-up in 1 to 2 weeks  Darral Dash, DO [facility]

## 2022-08-12 NOTE — Assessment & Plan Note (Addendum)
Heart rate in the 130s-140s today. EKG normal sinus rhythm, no evidence of arrhythmia Likely due to hypovolemia/dehydration

## 2022-08-12 NOTE — Patient Instructions (Addendum)
It was great seeing you today.  As we discussed, - Please go to the ER for further evaluation  Please call your endocrinologist, Karsten Fells, for an appointment Her phone number ZO:[phone]: [phone]    I will call you over the weekend to see how you are doing.   If you have any questions or concerns, please feel free to call the clinic.   Have a wonderful day,  Dr. Darral Dash [facility] [phone]

## 2022-08-12 NOTE — Patient Instructions (Signed)
Dawn [name],  Thank you for taking the time to speak with me today. I have included some information for you regarding your new health plan. Please contact [facility] Resources to connect with case management to assist you in managing your health.  Tailored Plan Medicaid On July 1, some people on [place] Medicaid will move to a new kind of Medicaid health plan called a Tailored Plan. Tailored Plans cover your doctor visits, prescription drugs, and health care services.    If your [place] Medicaid will move to a Tailored Plan, you should have gotten a letter and welcome packet. If you're not sure, call your [place] Medicaid Enrollment Broker at [phone] and ask.  Check out these free materials, in [place] and [place], to learn more about your Tailored Plan: [email or web address]/Tailored-Plans/Toolkit  Tailored Care Management Services  TCM services are available to you now. If you are a Tailored Plan member or will be and want information about Tailored Care Management Services including rides to appointments and community and home services, call the Care Management provider for your county of residence:    [facility] ([place], [place])  Member Services: [phone] Behavioral Health Crisis Line: [phone]

## 2022-08-12 NOTE — Patient Outreach (Signed)
  Care Management   Visit Note  08/12/2022 Name: Dawn [name] MRN: [id number] DOB: 2000/01/28  Subjective: Dawn [name] is a 22 y.o. year old female who is a primary care patient of Darral Dash, DO. The Care Management team was consulted for assistance.      Engaged with patient spoke with patient by telephone.   Assessment:   Interventions: Reviewed scheduled/upcoming provider appointments including:  Advised patient, providing education and rationale, to check cbg using CGM and record, calling provider for findings outside established parameters.   Reviewed [facility] Resources: Member Services: [phone]  Behavioral Health Crisis Line: [phone]  Tailored Plan Insurance information with the patient.   RNCM had a long discussion with Ms. Kirkland regarding the importance of managing diabetes and attending provider appointments. Ms. Mathes will call today to schedule follow up with Endocrinology. She will attend PCP appointment today. RNCM advised patient to take insulin pump, CGM and medications with her today. Patient voiced understanding.  Recommendations:     Consent to Services:  Patient consented to discuss Tailored Medicaid Plan and Tailored Care Management Needs today.  Plan: The patient will call [facility]* as advised to establish Case Management Services. Patient will schedule follow up with Endocrinology and attend PCP follow up today.  Estanislado Emms RN, BSN [place]  Managed [facility] RN Care Coordinator [phone]

## 2022-08-12 NOTE — Assessment & Plan Note (Addendum)
Point-of-care CBG 63 on arrival, improved to 88 with Molli Posey protein shake that had 20 g of sugar in it. Advised patient to go to the ER as I am concerned she may have another episode of DKA given hyperglycemia with vomiting, and general feeling of unwellness with tachycardia and signs of dehydration on exam.  She and her dad agreed to go. -Patient to go to the ED for evaluation for possible DKA versus dehydration and need for IV fluids. Well-appearing thus did not feel EMS transport necessary, Father agreed to drive her to ER. -I am pleased her father was at the visit today, as she needs more guidance and support with caring for her T1DM.  He asked to be a proxy on her MyChart, which I think is a great idea -We had a long discussion about her insulin regimen and dire need for further diabetes education.  She seems bright and she is engaging in our visit and wants to take better care of herself to feel better. -I placed an order for community care coordination to help with housing and food instability, as well as transportation barriers to appointments -Will call her over the weekend or Monday to follow-up and see how she is doing.  Ideally, we will have frequent and regular office visits

## 2022-08-15 ENCOUNTER — Telehealth: Payer: Self-pay | Admitting: *Deleted

## 2022-08-15 NOTE — Progress Notes (Signed)
  Care Coordination   Note   08/15/2022 Name: Dawn [name] MRN: [id number] DOB: Mar 07, 2000  Dawn [name] is a 22 y.o. year old female who sees Darral Dash, DO for primary care. I reached out to Dawn [name] by phone today to offer care coordination services.  [name] was given information about Care Coordination services today including:   The Care Coordination services include support from the care team which includes your Nurse Coordinator, Clinical Social Worker, or Pharmacist.  The Care Coordination team is here to help remove barriers to the health concerns and goals most important to you. Care Coordination services are voluntary, and the patient may decline or stop services at any time by request to their care team member.   Care Coordination Consent Status: Patient agreed to services and verbal consent obtained.   Follow up plan:  Telephone appointment with care coordination team member scheduled for:  08/16/2022 and 08/17/2022  Encounter Outcome:  Pt. Scheduled from referral   Burman Nieves, [facility] Care Coordination Care Guide Direct Dial: [phone]

## 2022-08-16 ENCOUNTER — Ambulatory Visit: Payer: Self-pay

## 2022-08-16 NOTE — Patient Instructions (Signed)
Visit Information  Thank you for taking time to visit with me today. Please don't hesitate to contact me if I can be of assistance to you.   Following are the goals we discussed today:   Goals Addressed             This Visit's Progress    Care Coordination Activities       Care Coordination Interventions: Housing-Patient has been sleeping on her mothers couch since July 2024 and has not has stable housing since leaving high school. There is tension between her mother and patient and they constantly argue. She is working with a program through her insurance to obtain housing, but is not exactly sure of the name of the program.  Client has no income and is working with an Pensions consultant regarding disability.  Was informed that if she is housed through the program, local resources with pay for utilities.  Patient is education on community resources that assist with deposits and crisis but not on an ongoing bases.  Most community organization need an understanding of how you will be able to maintain the bill if they agree to assist.  Patient reports her current plan is to relocate to [facility] 8/17 with a friend.   Food-Patient receives foodstamps.  Patient admits that she allows her 36,7,69 year old niece and nephew eat the food when they visit.  Patient reports her good heart won't let her ignore that the children haven't eaten.  SW reminds patient that it is the parents responsibility to feed their children and her choice is causing her to have critical medical issues.  Foodstamps policy does not allow the foodstamps to be used of others that are not listed as apart of her household.  Patient is recommended to go to [employer] for daily meals.  Patient feels that she can't tell others not to eat what's in the refrigerator since that is not her house.  SW does encourage patient to buy just enough for a couple days and keep the items bagged separately.  Transportation-Patient has upcoming appointments.   When she relocates to [place], she plans to use [employer].  T/c Motive and confirm patient is still active.  There is a 75 mile distance that the service will travel and [place] is 89 miles away.  The request will have to be reviewed before approval beyond 75 miles.  Patient will use the app to request transportation for 8/21.  There are also 2 appointment on 8/20.  Patient is unsure if she needs to keep the visits and will connect with the provider.         Please call the care guide team at [phone] if you need to cancel or reschedule your appointment.   If you are experiencing a Mental Health or Behavioral Health Crisis or need someone to talk to, please call 911  Patient verbalizes understanding of instructions and care plan provided today and agrees to view in MyChart. Active MyChart status and patient understanding of how to access instructions and care plan via MyChart confirmed with patient.     No further follow up required:    Lysle Morales, BSW Social Worker [facility] [place] Care Management  [phone]

## 2022-08-16 NOTE — Patient Outreach (Signed)
Care Coordination   Initial Visit Note   08/16/2022 Name: Dawn [name] MRN: [id number] DOB: 2000/07/05  Dawn [name] is a 22 y.o. year old female who sees Darral Dash, DO for primary care. I spoke with  Dawn [name] by phone today.  What matters to the patients health and wellness today?  Patient is living with her mother and wants to move out.      Goals Addressed             This Visit's Progress    Care Coordination Activities       Care Coordination Interventions: Housing-Patient has been sleeping on her mothers couch since July 2024 and has not has stable housing since leaving high school. There is tension between her mother and patient and they constantly argue. She is working with a program through her insurance to obtain housing, but is not exactly sure of the name of the program.  Client has no income and is working with an Pensions consultant regarding disability.  Was informed that if she is housed through the program, local resources with pay for utilities.  Patient is education on community resources that assist with deposits and crisis but not on an ongoing bases.  Most community organization need an understanding of how you will be able to maintain the bill if they agree to assist.  Patient reports her current plan is to relocate to [facility] 8/17 with a friend.   Food-Patient receives foodstamps.  Patient admits that she allows her 50,23,10 year old niece and nephew eat the food when they visit.  Patient reports her good heart won't let her ignore that the children haven't eaten.  SW reminds patient that it is the parents responsibility to feed their children and her choice is causing her to have critical medical issues.  Foodstamps policy does not allow the foodstamps to be used of others that are not listed as apart of her household.  Patient is recommended to go to [employer] for daily meals.  Patient feels that she can't tell others not to eat what's in the refrigerator since  that is not her house.  SW does encourage patient to buy just enough for a couple days and keep the items bagged separately.  Transportation-Patient has upcoming appointments.  When she relocates to [place], she plans to use [employer].  T/c Motive and confirm patient is still active.  There is a 75 mile distance that the service will travel and [place] is 89 miles away.  The request will have to be reviewed before approval beyond 75 miles.  Patient will use the app to request transportation for 8/21.  There are also 2 appointment on 8/20.  Patient is unsure if she needs to keep the visits and will connect with the provider.         SDOH assessments and interventions completed:  Yes  SDOH Interventions Today    Flowsheet Row Most Recent Value  SDOH Interventions   Food Insecurity Interventions Other (Comment)  [Foodstamps but others in the home eat her food]  Housing Interventions Other (Comment)  [Plans to leave her mothers home due to tension]  Transportation Interventions Other (Comment)  [Has to rely on family and friend.  Has access to [facility] Transportation]        Care Coordination Interventions:  Yes, provided   Interventions Today    Flowsheet Row Most Recent Value  General Interventions   General Interventions Discussed/Reviewed General Interventions Discussed, [employer], Doctor Visits  [employer]  Transportation, housing, food [employer]]  Doctor Visits Discussed/Reviewed Doctor Visits Discussed  [Upcoming visit on 8/21 and 8/20]        Follow up plan: No further intervention required.   Encounter Outcome:  Pt. Visit Completed

## 2022-08-17 ENCOUNTER — Telehealth: Payer: Self-pay

## 2022-08-17 NOTE — Patient Outreach (Signed)
  Care Coordination   08/17/2022 Name: Dawn [name] MRN: [id number] DOB: 05-26-2000   Care Coordination Outreach Attempts:  An unsuccessful telephone outreach was attempted today to offer the patient information about available care coordination services.  Follow Up Plan:  Additional outreach attempts will be made to offer the patient care coordination information and services.   Encounter Outcome:  No Answer   Care Coordination Interventions:  No, not indicated    Juanell Fairly RN, BSN, [facility] Care Coordinator [facility]   Phone: [phone]

## 2022-08-23 ENCOUNTER — Ambulatory Visit: Payer: MEDICAID | Admitting: Student

## 2022-08-23 ENCOUNTER — Encounter: Payer: MEDICAID | Admitting: Student

## 2022-08-23 ENCOUNTER — Telehealth: Payer: Self-pay

## 2022-08-23 NOTE — Transitions of Care (Post Inpatient/ED Visit) (Signed)
   08/23/2022  Name: Jerrilee Dimodica MRN: [id number] DOB: 2000/10/20  Today's TOC FU Call Status: Today's TOC FU Call Status:: Unsuccessful Call (1st Attempt) Unsuccessful Call (1st Attempt) Date: 08/23/22  Attempted to reach the patient regarding the most recent Inpatient/ED visit.  Follow Up Plan: Additional outreach attempts will be made to reach the patient to complete the Transitions of Care (Post Inpatient/ED visit) call.   Jodelle Gross, RN, BSN, CCM Care Management Coordinator [place] Phone: [phone]/Fax: [phone]

## 2022-08-24 ENCOUNTER — Telehealth: Payer: Self-pay

## 2022-08-24 ENCOUNTER — Ambulatory Visit: Payer: Self-pay | Admitting: Podiatry

## 2022-08-24 NOTE — Transitions of Care (Post Inpatient/ED Visit) (Signed)
   08/24/2022  Name: Molleigh Dishner MRN: [id number] DOB: 12-08-2000  Today's TOC FU Call Status: Today's TOC FU Call Status:: Unsuccessful Call (2nd Attempt) Unsuccessful Call (2nd Attempt) Date: 08/24/22  Attempted to reach the patient regarding the most recent Inpatient/ED visit.  Follow Up Plan: Additional outreach attempts will be made to reach the patient to complete the Transitions of Care (Post Inpatient/ED visit) call.   Jodelle Gross RN, BSN, CCM [facility] RN Care Coordinator/ Transitions of Care Direct Dial: [phone]  Fax: [phone]

## 2022-08-25 ENCOUNTER — Telehealth: Payer: Self-pay

## 2022-08-25 NOTE — Transitions of Care (Post Inpatient/ED Visit) (Signed)
   08/25/2022  Name: Dawn [name] MRN: [id number] DOB: 2000-04-14  Today's TOC FU Call Status: Today's TOC FU Call Status:: Unsuccessful Call (3rd Attempt) Unsuccessful Call (3rd Attempt) Date: 08/25/22  Attempted to reach the patient regarding the most recent Inpatient/ED visit.  Follow Up Plan: No further outreach attempts will be made at this time. We have been unable to contact the patient.  Jodelle Gross RN, BSN, CCM [facility] RN Care Coordinator/ Transitions of Care Direct Dial: [phone]  Fax: [phone]

## 2022-09-07 ENCOUNTER — Ambulatory Visit: Payer: Self-pay

## 2022-09-07 NOTE — Patient Outreach (Unsigned)
  Care Coordination   Initial Visit Note   09/08/2022 Name: Dawn [name] MRN: [id number] DOB: [birth year]  Dawn [name] is a 22 y.o. year old female who sees Darral Dash, DO for primary care. I spoke with  Dawn [name] by phone today.  What matters to the patients health and wellness today?  I discussed this with Dawn [name], who conveyed that her blood glucose levels have fluctuated significantly, ranging from low to very high. She is utilizing an Omnipod pump. Dawn [name] acknowledged that her dietary habits were not in line with her medical regimen despite being diligent in taking her prescribed medications. She encountered difficulty in providing comprehensive details. Dawn [name] disclosed her coverage under [employer] and confirmed the involvement of a caseworker, Tonia, with whom she anticipated communication. I advised her to inform me of any developments in her interaction with the caseworker. It was important to ensure the continuity of our communication until she was connected with the designated worker.    Goals Addressed             This Visit's Progress    I need to control my blood sugars and take better care of myself       Patient Goals/Self Care Activities: -Patient/Caregiver will take medications as prescribed   -Patient/Caregiver will attend all scheduled provider appointments -Patient/Caregiver will call pharmacy for medication refills 3-7 days in advance of running out of medications -Patient/Caregiver will call provider office for new concerns or questions  -Patient/Caregiver will focus on medication adherence by taking medications as prescribed  -check blood sugar at prescribed times -check blood sugar if I feel it is too high or too low -record values and write them down take them to all doctor visits  -schedule appointment with eye doctor    Lab Results  Component Value Date   HGBA1C 14.3 (H) 05/15/2022   HGBA1C 12.2 (H) 02/18/2022   HGBA1C 14.6 (H)  12/14/2021         I need to control my dm          SDOH assessments and interventions completed:  Yes  SDOH Interventions Today    Flowsheet Row Most Recent Value  SDOH Interventions   Food Insecurity Interventions Intervention Not Indicated  Transportation Interventions Intervention Not Indicated        Care Coordination Interventions:  Yes, provided   Interventions Today    Flowsheet Row Most Recent Value  Chronic Disease   Chronic disease during today's visit Diabetes  General Interventions   General Interventions Discussed/Reviewed General Interventions Discussed, Sick Day Rules, Annual Eye Exam  Nutrition Interventions   Nutrition Discussed/Reviewed Nutrition Discussed  Pharmacy Interventions   Pharmacy Dicussed/Reviewed Pharmacy Topics Discussed  Safety Interventions   Safety Discussed/Reviewed Safety Discussed       Follow up plan: Follow up call scheduled for 09/14/22 230 pm    Encounter Outcome:  Patient Visit Completed   Juanell Fairly RN, BSN, [facility]   Care Coordinator Phone: [phone]

## 2022-09-08 NOTE — Patient Instructions (Signed)
Visit Information  Thank you for taking time to visit with me today. Please don't hesitate to contact me if I can be of assistance to you.   Following are the goals we discussed today:   Goals Addressed             This Visit's Progress    I need to control my blood sugars and take better care of myself       Patient Goals/Self Care Activities: -Patient/Caregiver will take medications as prescribed   -Patient/Caregiver will attend all scheduled provider appointments -Patient/Caregiver will call pharmacy for medication refills 3-7 days in advance of running out of medications -Patient/Caregiver will call provider office for new concerns or questions  -Patient/Caregiver will focus on medication adherence by taking medications as prescribed  -check blood sugar at prescribed times -check blood sugar if I feel it is too high or too low -record values and write them down take them to all doctor visits  -schedule appointment with eye doctor    Lab Results  Component Value Date   HGBA1C 14.3 (H) 05/15/2022   HGBA1C 12.2 (H) 02/18/2022   HGBA1C 14.6 (H) 12/14/2021         I need to control my dm          Our next appointment is by telephone on 09/14/22 at 230 pm  Please call the care guide team at [phone] if you need to cancel or reschedule your appointment.   If you are experiencing a Mental Health or Behavioral Health Crisis or need someone to talk to, please call 1-800-273-TALK (toll free, 24 hour hotline)  Patient verbalizes understanding of instructions and care plan provided today and agrees to view in MyChart. Active MyChart status and patient understanding of how to access instructions and care plan via MyChart confirmed with patient.     Juanell Fairly RN, BSN, [facility] [employer]/designer Phone: [phone]

## 2022-09-14 ENCOUNTER — Ambulatory Visit: Payer: Self-pay

## 2022-09-14 NOTE — Patient Instructions (Signed)
Visit Information  Thank you for taking time to visit with me today. Please don't hesitate to contact me if I can be of assistance to you.   Following are the goals we discussed today:   Goals Addressed             This Visit's Progress    COMPLETED: I need to control my blood sugars and take better care of myself       Patient Goals/Self Care Activities: -Patient/Caregiver will take medications as prescribed   -Patient/Caregiver will attend all scheduled provider appointments -Patient/Caregiver will call pharmacy for medication refills 3-7 days in advance of running out of medications -Patient/Caregiver will call provider office for new concerns or questions  -Patient/Caregiver will focus on medication adherence by taking medications as prescribed  -check blood sugar at prescribed times -check blood sugar if I feel it is too high or too low -record values and write them down take them to all doctor visits  -schedule appointment with eye doctor    Lab Results  Component Value Date   HGBA1C 14.3 (H) 05/15/2022   HGBA1C 12.2 (H) 02/18/2022   HGBA1C 14.6 (H) 12/14/2021           Follow Up Plan: Patient does not require or desire continued follow-up. Will contact the office if needed   Juanell Fairly RN, BSN, [facility] [employer] blower/designer Phone: [phone]

## 2022-09-14 NOTE — Patient Outreach (Signed)
  Care Coordination   Follow Up Visit Note   09/14/2022 Name: Dawn [name] MRN: [id number] DOB: Jun 28, 2000  Dawn [name] is a 22 y.o. year old female who sees Darral Dash, DO for primary care. I spoke with  Dawn [name] by phone today.  What matters to the patients health and wellness today?  I recently spoke with [facility], who informed me that she is in communication with her case Production designer, theatre/television/film from [place]. She mentioned that her case manager will visit her at home tomorrow for further discussions, and consequently, she no longer requires my assistance. I assured her she may retain my contact information.     Goals Addressed             This Visit's Progress    COMPLETED: I need to control my blood sugars and take better care of myself       Patient Goals/Self Care Activities: -Patient/Caregiver will take medications as prescribed   -Patient/Caregiver will attend all scheduled provider appointments -Patient/Caregiver will call pharmacy for medication refills 3-7 days in advance of running out of medications -Patient/Caregiver will call provider office for new concerns or questions  -Patient/Caregiver will focus on medication adherence by taking medications as prescribed  -check blood sugar at prescribed times -check blood sugar if I feel it is too high or too low -record values and write them down take them to all doctor visits  -schedule appointment with eye doctor    Lab Results  Component Value Date   HGBA1C 14.3 (H) 05/15/2022   HGBA1C 12.2 (H) 02/18/2022   HGBA1C 14.6 (H) 12/14/2021            SDOH assessments and interventions completed:  No     Care Coordination Interventions:  Yes, provided   Interventions Today    Flowsheet Row Most Recent Value  General Interventions   General Interventions Discussed/Reviewed General Interventions Discussed, Communication with  Communication with RN  Charlyne Mom is working with trillium nurse]        Follow up  plan: No further intervention required.   Encounter Outcome:  Patient Visit Completed   Juanell Fairly RN, BSN, [facility] [employer]er/designer Phone: [phone]

## 2022-09-28 ENCOUNTER — Telehealth: Payer: Self-pay

## 2022-09-28 NOTE — Transitions of Care (Post Inpatient/ED Visit) (Signed)
09/28/2022  Name: Dawn [name] MRN: [id number] DOB: 08-27-2000  Today's TOC FU Call Status: Today's TOC FU Call Status:: Unsuccessful Call (1st Attempt) Unsuccessful Call (1st Attempt) Date: 09/28/22  Attempted to reach the patient regarding the most recent Inpatient/ED visit.  Follow Up Plan: Additional outreach attempts will be made to reach the patient to complete the Transitions of Care (Post Inpatient/ED visit) call.   Signature Karena Addison, LPN [facility] Nurse Health Advisor Direct Dial [phone]

## 2022-09-29 ENCOUNTER — Ambulatory Visit: Payer: MEDICAID

## 2022-09-29 NOTE — Transitions of Care (Post Inpatient/ED Visit) (Signed)
09/29/2022  Name: Dawn [name] MRN: [id number] DOB: 2000-08-17  Today's TOC FU Call Status: Today's TOC FU Call Status:: Unsuccessful Call (2nd Attempt) Unsuccessful Call (1st Attempt) Date: 09/28/22 Unsuccessful Call (2nd Attempt) Date: 09/29/22  Attempted to reach the patient regarding the most recent Inpatient/ED visit.  Follow Up Plan: Additional outreach attempts will be made to reach the patient to complete the Transitions of Care (Post Inpatient/ED visit) call.   Signature Karena Addison, LPN [facility] Nurse Health Advisor Direct Dial [phone]

## 2022-10-02 DIAGNOSIS — U071 COVID-19: Secondary | ICD-10-CM | POA: Insufficient documentation

## 2022-10-03 NOTE — Transitions of Care (Post Inpatient/ED Visit) (Signed)
10/03/2022  Name: Dawn [name] MRN: [id number] DOB: 07-Jun-2000  Today's TOC FU Call Status: Today's TOC FU Call Status:: Unsuccessful Call (3rd Attempt) Unsuccessful Call (1st Attempt) Date: 09/28/22 Unsuccessful Call (2nd Attempt) Date: 09/29/22 Unsuccessful Call (3rd Attempt) Date: 10/03/22  Attempted to reach the patient regarding the most recent Inpatient/ED visit.  Follow Up Plan: No further outreach attempts will be made at this time. We have been unable to contact the patient.  Signature Karena Addison, LPN [facility] Nurse Health Advisor Direct Dial [phone]

## 2022-10-05 ENCOUNTER — Telehealth: Payer: Self-pay | Admitting: Student

## 2022-10-05 NOTE — Transitions of Care (Post Inpatient/ED Visit) (Signed)
10/05/2022  Name: Kaveri Thiry MRN: [id number] DOB: 2000-04-05  Attempted to contact patient for TOC follow up. The number goes straight to a message that states the number cannot be completed as dialed. Unsuccessful attempt # 1.  Deidre Ala, RN RN CARE MANAGER [facility] [phone]

## 2022-10-06 ENCOUNTER — Telehealth: Payer: Self-pay | Admitting: Student

## 2022-10-06 NOTE — Transitions of Care (Post Inpatient/ED Visit) (Signed)
10/06/2022  Name: Dawn [name] MRN: [id number] DOB: 02/07/2000  Today's TOC FU Call Status:    Attempted to reach the patient regarding the most recent Inpatient/ED visit. The patient discharged from the ED 10/04/22 with no definitive discharge plan  Follow Up Plan: Additional outreach attempts will be made to reach the patient to complete the Transitions of Care (Post Inpatient/ED visit) call.   Deidre Ala, RN Medical illustrator [facility] [phone]

## 2022-10-07 ENCOUNTER — Telehealth: Payer: Self-pay

## 2022-10-07 NOTE — Transitions of Care (Post Inpatient/ED Visit) (Signed)
10/07/2022  Name: Janon Eppolito MRN: [id number] DOB: [birth year]  Today's TOC FU Call Status: Today's TOC FU Call Status:: Unsuccessful Call (3rd Attempt) Unsuccessful Call (3rd Attempt) Date: 10/07/22  Attempted to reach the patient regarding the most recent Inpatient/ED visit.  Follow Up Plan: No further outreach attempts will be made at this time. We have been unable to contact the patient.  Jodelle Gross RN, BSN, CCM RN Care Manager  Transitions of Care  [facility]  [phone]

## 2022-10-19 ENCOUNTER — Encounter: Payer: Self-pay | Admitting: Student

## 2022-10-20 ENCOUNTER — Telehealth: Payer: Self-pay

## 2022-10-20 NOTE — Telephone Encounter (Signed)
Patient calls nurse line requesting a refill on Dexcom G7 Sensors.   She reports she ran out and has not checked her sugar in days. Denies any active symptoms.   Last A1c was 9/29 and 13.2, so insurance should cover.  Please send sensors to [facility] on [address].   Of note, she also reports she is "15 days late" on her period. Patient advised to take a home pregnancy test.   Patient scheduled for 11/4 with PCP for FU and STI testing.

## 2022-10-21 MED ORDER — DEXCOM G7 SENSOR MISC: 1.0000 | 3 refills | Status: DC

## 2022-10-27 ENCOUNTER — Ambulatory Visit: Payer: MEDICAID | Admitting: Family Medicine

## 2022-10-27 ENCOUNTER — Other Ambulatory Visit (HOSPITAL_COMMUNITY)
Admission: RE | Admit: 2022-10-27 | Discharge: 2022-10-27 | Disposition: A | Discharge status: Home / Self Care | Payer: BLUE CROSS/BLUE SHIELD | Source: Ambulatory Visit | Attending: Family Medicine | Admitting: Family Medicine

## 2022-10-27 ENCOUNTER — Encounter: Payer: Self-pay | Admitting: Family Medicine

## 2022-10-27 VITALS — BP 112/81 | HR 105 | Ht 60.0 in | Wt 113.4 lb

## 2022-10-27 DIAGNOSIS — Z3200 Encounter for pregnancy test, result unknown: Secondary | ICD-10-CM

## 2022-10-27 DIAGNOSIS — E1010 Type 1 diabetes mellitus with ketoacidosis without coma: Secondary | ICD-10-CM

## 2022-10-27 DIAGNOSIS — Z113 Encounter for screening for infections with a predominantly sexual mode of transmission: Secondary | ICD-10-CM | POA: Diagnosis present

## 2022-10-27 LAB — POCT GLYCOSYLATED HEMOGLOBIN (HGB A1C): HbA1c, POC (controlled diabetic range): 11.7 % — AB (ref 0.0–7.0)

## 2022-10-27 LAB — POCT WET PREP (WET MOUNT)
Clue Cells Wet Prep Whiff POC: NEGATIVE
Trichomonas Wet Prep HPF POC: ABSENT

## 2022-10-27 LAB — POCT URINE PREGNANCY: Preg Test, Ur: NEGATIVE

## 2022-10-27 NOTE — Patient Instructions (Addendum)
Thank you for visiting clinic today - it is always our pleasure to care for you.  Today we discussed your recent missed period and completed a routine STI screening. Your urine pregnancy test today was negative. We will collect a blood pregnancy test in addition to other labwork and contact you with the results.   Please schedule an appointment to see your PCP as soon as you are able for follow up and continued diabetes care.   Reach out any time with any questions or concerns you may have - we are here for you!  Ivery Quale, MD [facility] [phone]

## 2022-10-27 NOTE — Progress Notes (Cosign Needed Addendum)
    SUBJECTIVE:   CHIEF COMPLAINT / HPI:   Possible pregnancy  Patient concerned about recent missed period. Her LMP began on September 17th, and she normally gets monthly periods. She took a home pregnancy test on Sunday which was negative. Not currently on birth control, recent sexual encounter without condom use. Patient notes her nipples have been sore and she has been sleeping and feeling more frustrated lately. She has occasional N/V, no significant abdominal pain or cramping. She has been routinely taking prenatal vitamins and would desire this pregnancy if she is found to be pregnant.   Routine STI screening  Patient would like routine STI/STD screening today. She notes some increased milky discharge recently. No dysuria, hematuria, odor, or bleeding. Patient had a pap smear completed with normal findings a few months ago at an outside provider.  T1DM Patient was hospitalized in September for DKA and notes feeling better since this event. She has not noticed symptoms related to her diabetes recently. She is currently using only her insulin pump at home. She has a Dexcom in place that shows she is in target range about 50% of the time.   PERTINENT  PMH / PSH: T1DM, DKA, MDD  OBJECTIVE:   BP 112/81   Pulse (!) 105   Ht 5' (1.524 m)   Wt 113 lb 6.4 oz (51.4 kg)   LMP 09/20/2022   SpO2 100%   BMI 22.15 kg/m   General: Well-appearing. Resting comfortably in room. CV: Normal S1/S2. No extra heart sounds. Warm and well-perfused. Pulm: Breathing comfortably on room air. CTAB. No increased WOB. Abd: Soft, non-distended. Mild tenderness to deep palpation of abdomen in upper quadrants. No rebound or guarding.  Pelvic: Normal appearing cervix without apparent lesions. Normal appearing vaginal rugae.  Skin:  Warm, dry. Psych: Pleasant and appropriate.    ASSESSMENT/PLAN:   Possible pregnancy Negative urine pregnancy test in clinic today. - Blood pregnancy test ordered  - CBC, CMP  ordered  Routine STI screening Pelvic exam completed with CMA Cleatrice Burke) as Nurse, children's.  - G/C, trich, wet prep ordered  - HIV, RPR ordered   T1DM Patient with a history of poorly controlled T1DM, including DKA.  - A1c ordered today - CBC, CMP ordered   Patient to return to clinic to see PCP as soon as possible for diabetes follow up.   Ivery Quale, MD [facility]

## 2022-10-28 LAB — CBC
Hematocrit: 32.9 % — ABNORMAL LOW (ref 34.0–46.6)
Hemoglobin: 9.7 g/dL — ABNORMAL LOW (ref 11.1–15.9)
MCH: 25.9 pg — ABNORMAL LOW (ref 26.6–33.0)
MCHC: 29.5 g/dL — ABNORMAL LOW (ref 31.5–35.7)
MCV: 88 fL (ref 79–97)
Platelets: 370 10*3/uL (ref 150–450)
RBC: 3.75 x10E6/uL — ABNORMAL LOW (ref 3.77–5.28)
RDW: 15.1 % (ref 11.7–15.4)
WBC: 5.6 10*3/uL (ref 3.4–10.8)

## 2022-10-28 LAB — COMPREHENSIVE METABOLIC PANEL
ALT: 11 [IU]/L (ref 0–32)
AST: 19 [IU]/L (ref 0–40)
Albumin: 3.7 g/dL — ABNORMAL LOW (ref 4.0–5.0)
Alkaline Phosphatase: 92 [IU]/L (ref 44–121)
BUN/Creatinine Ratio: 19 (ref 9–23)
BUN: 13 mg/dL (ref 6–20)
Bilirubin Total: <0.2 mg/dL (ref 0.0–1.2)
CO2: 19 mmol/L — ABNORMAL LOW (ref 20–29)
Calcium: 10.1 mg/dL (ref 8.7–10.2)
Chloride: 99 mmol/L (ref 96–106)
Creatinine, Ser: 0.67 mg/dL (ref 0.57–1.00)
Globulin, Total: 3.4 g/dL (ref 1.5–4.5)
Glucose: 451 mg/dL — ABNORMAL HIGH (ref 70–99)
Potassium: 5.6 mmol/L — ABNORMAL HIGH (ref 3.5–5.2)
Sodium: 136 mmol/L (ref 134–144)
Total Protein: 7.1 g/dL (ref 6.0–8.5)
eGFR: 127 mL/min/{1.73_m2} (ref >59)

## 2022-10-28 LAB — HIV ANTIBODY (ROUTINE TESTING W REFLEX): HIV Screen 4th Generation wRfx: NONREACTIVE

## 2022-10-28 LAB — BETA HCG QUANT (REF LAB): hCG Quant: <1 m[IU]/mL

## 2022-10-28 LAB — RPR: RPR Ser Ql: NONREACTIVE

## 2022-10-29 NOTE — Progress Notes (Incomplete)
    SUBJECTIVE:   CHIEF COMPLAINT / HPI:   Possible pregnancy  Patient concerned about recent missed period. Her LMP began on September 17th, and she normally gets monthly periods. She took a home pregnancy test on Sunday which was negative. Not currently on birth control, recent sexual encounter without condom use. Patient notes her nipples have been sore and she has been sleeping and feeling more frustrated lately. She has occasional N/V, no significant abdominal pain or cramping. She has been routinely taking prenatal vitamins and would desire this pregnancy if she is found to be pregnant.   Routine STI screening  Patient would like routine STI/STD screening today. She notes some increased milky discharge recently. No dysuria, hematuria, odor, or bleeding. Patient had a pap smear completed with normal findings a few months ago at an outside provider.  T1DM Patient was hospitalized in September for DKA and notes feeling better since this event. She has not noticed symptoms related to her diabetes recently. She is currently using only her insulin pump at home. She has a Dexcom in place that shows she is in target range about 50% of the time.   PERTINENT  PMH / PSH: T1DM, DKA, MDD  OBJECTIVE:   BP 112/81   Pulse (!) 105   Ht 5' (1.524 m)   Wt 113 lb 6.4 oz (51.4 kg)   LMP 09/20/2022   SpO2 100%   BMI 22.15 kg/m   General: Well-appearing. Resting comfortably in room. CV: Normal S1/S2. No extra heart sounds. Warm and well-perfused. Pulm: Breathing comfortably on room air. CTAB. No increased WOB. Abd: Soft, non-distended. Mild tenderness to deep palpation of abdomen in upper quadrants. No rebound or guarding.  Pelvic: Normal appearing cervix without apparent lesions. Normal appearing vaginal rugae.  Skin:  Warm, dry. Psych: Pleasant and appropriate.    ASSESSMENT/PLAN:   Possible pregnancy Negative urine pregnancy test in clinic today. - Blood pregnancy test ordered     Ivery Quale, MD [facility]

## 2022-10-30 ENCOUNTER — Telehealth: Payer: Self-pay | Admitting: Family Medicine

## 2022-10-30 ENCOUNTER — Encounter: Payer: Self-pay | Admitting: Family Medicine

## 2022-10-30 ENCOUNTER — Other Ambulatory Visit: Payer: Self-pay | Admitting: Family Medicine

## 2022-10-30 MED ORDER — DOXYCYCLINE HYCLATE 100 MG PO TABS: 100.0000 mg | ORAL_TABLET | Freq: Two times a day (BID) | ORAL | 0 refills | Status: AC

## 2022-10-30 MED ORDER — DEXCOM G7 SENSOR MISC: 1.0000 | 3 refills | Status: AC

## 2022-10-30 NOTE — Telephone Encounter (Signed)
Spoke with patient regarding recent lab results including: - negative pregnancy tests - negative HIV and syphillis - positive chlamydia with doxycyline 100BID x7 day treatment sent to patient pharmacy  - possible vaginal yeast infection with recommendation of OTC antifungal cream as needed for symptoms - poorly controlled blood glucose, with recommendation for patient to see her endocrinologist soon  - mildly elevated potassium that may be secondary to insulin deficiency  - low hemoglobin on CBC, to be further assessed at next visit   Scheduled patient to return to clinic on Oct 30 at 8:45AM for follow up on amenorrhea, anemia, and hyperkalemia.

## 2022-10-30 NOTE — Progress Notes (Signed)
Ordered doxycycline 100 BID x 7 days for treatment of chlamydia. Refilled Dexcom prescription per patient request.

## 2022-10-31 ENCOUNTER — Encounter: Payer: Self-pay | Admitting: Family Medicine

## 2022-10-31 ENCOUNTER — Ambulatory Visit: Payer: MEDICAID | Admitting: Family Medicine

## 2022-10-31 ENCOUNTER — Telehealth: Payer: Self-pay | Admitting: Family Medicine

## 2022-10-31 VITALS — BP 127/87 | HR 118 | Ht 60.0 in | Wt 113.0 lb

## 2022-10-31 DIAGNOSIS — R051 Acute cough: Secondary | ICD-10-CM

## 2022-10-31 DIAGNOSIS — N926 Irregular menstruation, unspecified: Secondary | ICD-10-CM

## 2022-10-31 DIAGNOSIS — E1010 Type 1 diabetes mellitus with ketoacidosis without coma: Secondary | ICD-10-CM

## 2022-10-31 LAB — CERVICOVAGINAL ANCILLARY ONLY
Chlamydia: POSITIVE — AB
Comment: NEGATIVE
Comment: NORMAL
Comment: NEGATIVE
Neisseria Gonorrhea: NEGATIVE
Trichomonas: NEGATIVE

## 2022-10-31 NOTE — Patient Instructions (Signed)
It was great to see you today! Thank you for choosing [facility] for your primary care. Dawn [name] was seen for T1DM and lab review.  Today we addressed: Diabetes At your last visit your labs demonstrated that A1c was in proving however it is still elevated at 11.7.  I recommend that you moderate your carbohydrate intake by limiting the amount of rice, pasta, noodles, sugar you intake.  Please follow-up with your endocrinologist at your earliest convenience and when scheduling allows to adjust your insulin dosage as needed.  Will recheck some labs today and follow up to assess your lower hemoglobin further and recheck your metabolic panel.  Cough You can try Sugar-Free Robitussin over the counter to help improve your symptoms. Hot soups or liquids can also help!  If you haven't already, sign up for My Chart to have easy access to your labs results, and communication with your primary care physician.  You should return to our clinic No follow-ups on file. Please arrive 15 minutes before your appointment to ensure smooth check in process.  We appreciate your efforts in making this happen.  Thank you for allowing me to participate in your care, Fortunato Curling, DO 10/31/2022, 4:37 PM PGY-1, [facility]

## 2022-10-31 NOTE — Assessment & Plan Note (Signed)
Discussed that it isn't uncommon to have fluctuating periods especially in times of stress. Negative pregnancy test at her last visit. If her periods continue to be irregular we can further investigate.

## 2022-10-31 NOTE — Progress Notes (Signed)
    SUBJECTIVE:   CHIEF COMPLAINT / HPI: T1DM/DKA and menstrual cycle  T1DM Patient reports fatigue and headache, no vomiting or urinary changes. Patient reports feeling okay overall. A1c elevated to 11.7 and glucose elevated to 451 on CMP last week. She is on an insulin pump. Avg glucose over the last week is 214.   Tried to schedule an appointment with her endocrinologist but was unable to get an appointment until next month, but they will work with her to get her in sooner. Endocrinologist talked to her about possibly adjusting the insulin dosage at the next appointment. Patient is waiting to hear back from her regarding getting an earlier appointment.   Menstrual Cycle LMP on 09/20/22 for about 2 weeks. Negative pregnancy test at her last visit. She normally has two periods a month (beginning and at the end), but hasn't had another since. No signs or symptoms of having her period. Reports some bloating, constipation, and headaches, but no cramping.  Cough Coughing for a few days now and has tried cough drops, but hasn't improved. Associates headaches. Denies fever, chills, or body aches.   PERTINENT  PMH / PSH: T1DM, DKA, Chlamydia  OBJECTIVE:   BP 127/87   Pulse (!) 118   Ht 5' (1.524 m)   Wt 113 lb (51.3 kg)   LMP 09/20/2022   SpO2 100%   BMI 22.07 kg/m   General: Awake and Alert in NAD HEENT: Normocephalic, atraumatic. Conjunctiva normal. No nasal discharge Cardiovascular: RRR. No M/R/G Respiratory: CTAB, normal WOB on RA. No wheezing, crackles, rhonchi, or diminished breath sounds. Abdomen: Soft, non-tender, non-distended. Bowel sounds normoactive Extremities: No BLE edema, no deformities or significant joint findings. Skin: Warm and dry. Neuro: A&Ox3. No focal neurological deficits.  ASSESSMENT/PLAN:   Assessment & Plan Type 1 diabetes mellitus with ketoacidosis without coma (HCC) A1c at her last visit 11.7. Advised patient to follow up with her endocrinologist as  soon as scheduling allows. - Advised pt to continue monitoring with her Dexcom and continue using her insulin pump - Follow up with endocrinologist - CMP and Anemia Profile B checked today to further assess low Hgb levels from her last visit Acute cough Cough for the past few days with no fevers, chills, or body aches. - Recommended trying Sugar-Free Robitussin to help improve her symptoms or hot liquids/soups. Menstrual periods irregular Discussed that it isn't uncommon to have fluctuating periods especially in times of stress. Negative pregnancy test at her last visit. If her periods continue to be irregular we can further investigate.   Fortunato Curling, DO [facility]

## 2022-10-31 NOTE — Telephone Encounter (Signed)
Spoke with patient regarding closer follow up of her metabolic labs. This morning, patient reports fatigue and headache, no vomiting or urine changes. Patient reports feeling okay overall. Scheduled patient for clinic appointment this afternoon at 4:00PM. Hospital precautions discussed. Patient expressed understanding throughout conversation.

## 2022-11-01 LAB — ANEMIA PROFILE B
Basophils Absolute: 0.1 10*3/uL (ref 0.0–0.2)
Basos: 1 %
EOS (ABSOLUTE): 0.1 10*3/uL (ref 0.0–0.4)
Eos: 1 %
Ferritin: 7 ng/mL — ABNORMAL LOW (ref 15–150)
Folate: 8.2 ng/mL (ref >3.0)
Hematocrit: 33.5 % — ABNORMAL LOW (ref 34.0–46.6)
Hemoglobin: 10 g/dL — ABNORMAL LOW (ref 11.1–15.9)
Immature Grans (Abs): 0 10*3/uL (ref 0.0–0.1)
Immature Granulocytes: 0 %
Iron Saturation: 4 % — CL (ref 15–55)
Iron: 20 ug/dL — ABNORMAL LOW (ref 27–159)
Lymphocytes Absolute: 3 10*3/uL (ref 0.7–3.1)
Lymphs: 46 %
MCH: 25.4 pg — ABNORMAL LOW (ref 26.6–33.0)
MCHC: 29.9 g/dL — ABNORMAL LOW (ref 31.5–35.7)
MCV: 85 fL (ref 79–97)
Monocytes Absolute: 0.5 10*3/uL (ref 0.1–0.9)
Monocytes: 8 %
Neutrophils Absolute: 2.9 10*3/uL (ref 1.4–7.0)
Neutrophils: 44 %
Platelets: 474 10*3/uL — ABNORMAL HIGH (ref 150–450)
RBC: 3.93 x10E6/uL (ref 3.77–5.28)
RDW: 15.4 % (ref 11.7–15.4)
Retic Ct Pct: 2.3 % (ref 0.6–2.6)
Total Iron Binding Capacity: 526 ug/dL — ABNORMAL HIGH (ref 250–450)
UIBC: 506 ug/dL — ABNORMAL HIGH (ref 131–425)
Vitamin B-12: 281 pg/mL (ref 232–1245)
WBC: 6.5 10*3/uL (ref 3.4–10.8)

## 2022-11-01 LAB — COMPREHENSIVE METABOLIC PANEL
ALT: 13 [IU]/L (ref 0–32)
AST: 18 [IU]/L (ref 0–40)
Albumin: 4.5 g/dL (ref 4.0–5.0)
Alkaline Phosphatase: 109 [IU]/L (ref 44–121)
BUN/Creatinine Ratio: 20 (ref 9–23)
BUN: 17 mg/dL (ref 6–20)
Bilirubin Total: <0.2 mg/dL (ref 0.0–1.2)
CO2: 21 mmol/L (ref 20–29)
Calcium: 10.2 mg/dL (ref 8.7–10.2)
Chloride: 106 mmol/L (ref 96–106)
Creatinine, Ser: 0.87 mg/dL (ref 0.57–1.00)
Globulin, Total: 3.3 g/dL (ref 1.5–4.5)
Glucose: 80 mg/dL (ref 70–99)
Potassium: 4.4 mmol/L (ref 3.5–5.2)
Sodium: 141 mmol/L (ref 134–144)
Total Protein: 7.8 g/dL (ref 6.0–8.5)
eGFR: 97 mL/min/{1.73_m2} (ref >59)

## 2022-11-02 ENCOUNTER — Other Ambulatory Visit: Payer: Self-pay | Admitting: Family Medicine

## 2022-11-02 ENCOUNTER — Ambulatory Visit: Payer: Self-pay | Admitting: Family Medicine

## 2022-11-02 DIAGNOSIS — D509 Iron deficiency anemia, unspecified: Secondary | ICD-10-CM

## 2022-11-02 MED ORDER — FERROUS SULFATE 324 MG PO TBEC: 324.0000 mg | DELAYED_RELEASE_TABLET | ORAL | 1 refills | Status: DC

## 2022-11-02 NOTE — Progress Notes (Signed)
Prescribed ferrous sulfate for IDA demonstrated on anemia profile - low ferritin, iron saturation, and hemoglobin along with elevated TIBC. Shared the risk of constipation and dark/black stools with iron supplementation with the patient. Advised patient to call/message with questions.  Fortunato Curling, DO [facility], PGY-1

## 2022-11-07 ENCOUNTER — Ambulatory Visit: Payer: MEDICAID | Admitting: Student

## 2022-11-21 LAB — HM DIABETES EYE EXAM

## 2022-11-29 ENCOUNTER — Encounter (HOSPITAL_BASED_OUTPATIENT_CLINIC_OR_DEPARTMENT_OTHER): Payer: Self-pay | Admitting: Emergency Medicine

## 2022-11-29 ENCOUNTER — Other Ambulatory Visit: Payer: Self-pay

## 2022-11-29 ENCOUNTER — Emergency Department (HOSPITAL_BASED_OUTPATIENT_CLINIC_OR_DEPARTMENT_OTHER)
Admission: EM | Admit: 2022-11-29 | Discharge: 2022-11-29 | Disposition: A | Discharge status: Home / Self Care | Payer: BLUE CROSS/BLUE SHIELD | Attending: Emergency Medicine | Admitting: Emergency Medicine

## 2022-11-29 DIAGNOSIS — E10649 Type 1 diabetes mellitus with hypoglycemia without coma: Secondary | ICD-10-CM | POA: Insufficient documentation

## 2022-11-29 DIAGNOSIS — Z91040 Latex allergy status: Secondary | ICD-10-CM | POA: Diagnosis not present

## 2022-11-29 DIAGNOSIS — Z794 Long term (current) use of insulin: Secondary | ICD-10-CM | POA: Diagnosis not present

## 2022-11-29 DIAGNOSIS — E162 Hypoglycemia, unspecified: Secondary | ICD-10-CM

## 2022-11-29 LAB — CBG MONITORING, ED
Glucose-Capillary: 83 mg/dL (ref 70–99)
Glucose-Capillary: 357 mg/dL — ABNORMAL HIGH (ref 70–99)
Glucose-Capillary: 334 mg/dL — ABNORMAL HIGH (ref 70–99)

## 2022-11-29 LAB — PREGNANCY, URINE: Preg Test, Ur: NEGATIVE

## 2022-11-29 NOTE — Discharge Instructions (Signed)
Please read and follow all provided instructions.  Your diagnoses today include:  1. Hypoglycemia     Tests performed today include: Blood sugars were low, now in 300's Vital signs. See below for your results today.   Medications prescribed:  None  Take any prescribed medications only as directed.  Home care instructions:  Follow any educational materials contained in this packet.  BE VERY CAREFUL not to take multiple medicines containing Tylenol (also called acetaminophen). Doing so can lead to an overdose which can damage your liver and cause liver failure and possibly death.   Follow-up instructions: Call your endocrinologist for further instructions regarding your insulin regimen.  If giving fixed dose insulin, err on the side of a lower dose.  Return instructions:  Please return to the Emergency Department if you experience worsening symptoms.  Please return if you have any other emergent concerns.  Additional Information:  Your vital signs today were: BP 128/80 (BP Location: Right Arm)   Pulse 85   Temp 97.8 F (36.6 C)   Resp 16   Ht 5' (1.524 m)   Wt 51.3 kg   LMP 11/15/2022 (Approximate)   SpO2 100%   BMI 22.07 kg/m  If your blood pressure (BP) was elevated above 135/85 this visit, please have this repeated by your doctor within one month. --------------

## 2022-11-29 NOTE — ED Notes (Signed)
Reviewed discharge instructions and home care with pt. Pt verbalized understanding and had no further questions. Pt exited ED without complications.

## 2022-11-29 NOTE — ED Notes (Addendum)
Pt was asked to turn insulin pump on at 1353 after elevated blood sugar reading of 357. MD notified. Pt instructed to use her insulin pump. Stated that she will use it. Re-checked CBG at 1532 and was 334. Pt stated that her insulin pump may have been expired but re-attempted after encouragement. 10 units of insulin given from pt's insulin pump.

## 2022-11-29 NOTE — ED Triage Notes (Signed)
Pt arrived via [facility], per EMS, pt was found by mother in bed with hypoglycemia, pt was arousable and ate and drank with EMS on scene. Pt caox4 with no complaints on arrival to ED other than having issues with fluctuating blood sugar.   EMS VS  CBG 49 to 60 after eating  124/80 BP 88 HR  18 RR  99% SpO2

## 2022-11-29 NOTE — ED Provider Notes (Signed)
[place] EMERGENCY DEPARTMENT AT [facility] Provider Note   CSN: [id number] Arrival date & time: 11/29/22  1227     History  Chief Complaint  Patient presents with   Hypoglycemia    Dawn [name] is a 22 y.o. female.  Patient with history of type 1 diabetes, frequent DKA presents to the emergency department for hypoglycemic episode today.  She has been having these recently, as noted in her most recent endocrinology note.  She reports administering 10 units of insulin this morning.  She states that she did not eat and drink very much.  Recently they had her decrease amount of fixed dose insulin given from 15-10.  No fevers, nausea, vomiting, diarrhea.  Per EMS report, patient was found in bed this morning with a hypoglycemic spell.  Blood sugar was reportedly 49.  Patient ate and drank with some improvement.       Home Medications Prior to Admission medications   Medication Sig Start Date End Date Taking? Authorizing Provider  Continuous Glucose Sensor (DEXCOM G7 SENSOR) MISC Inject 1 Application into the skin as directed. 10/30/22   Ivery Quale, MD  escitalopram (LEXAPRO) 10 MG tablet Take 10 mg by mouth daily. 06/08/22   [provider]  ferrous sulfate 324 MG TBEC Take 1 tablet (324 mg total) by mouth every other day. 11/02/22   Fortunato Curling, DO  Glucagon (BAQSIMI TWO PACK) 3 MG/DOSE POWD Place 3 mg into the nose as needed (low blood sugar).    [provider]  Glucagon (GVOKE HYPOPEN 2-PACK) 1 MG/0.2ML SOAJ Inject 1 mg into the skin as needed for up to 2 doses (Severe low blood sugar). 07/16/22   Noralee Stain, DO  Insulin Human (INSULIN PUMP) SOLN Inject into the skin.    [provider]  insulin lispro (HUMALOG) 100 UNIT/ML KwikPen Inject 3 Units into the skin 3 (three) times daily with meals. If eating and Blood Glucose (BG) 80 or higher inject 3 units for meal coverage and add correction dose per scale. If not eating, correction dose  only. BG <150= 0 unit; BG 150-200= 1 unit; BG 201-250= 2 unit; BG 251-300= 3 unit; BG 301-350= 4 unit; BG 351-400= 5 unit; BG >400= 6 unit and Call Primary Care. Patient not taking: Reported on 10/27/2022 07/16/22   Noralee Stain, DO  SUMAtriptan (IMITREX) 25 MG tablet Take 0.5 tablets (12.5 mg total) by mouth every 2 (two) hours as needed for migraine. May repeat in 2 hours if headache persists or recurs. 04/12/21   Dana Allan, MD  tiZANidine (ZANAFLEX) 4 MG tablet Take 4 mg by mouth every 6 (six) hours as needed for muscle spasms. Patient not taking: Reported on 09/07/2022    [provider]  Vitamin D, Ergocalciferol, (DRISDOL) 1.25 MG (50000 UNIT) CAPS capsule Take 50,000 Units by mouth every 7 (seven) days. 05/24/22   [provider]      Allergies    Latex, Tape, and Shellfish allergy    Review of Systems   Review of Systems  Physical Exam Updated Vital Signs BP (!) 131/97   Pulse 92   Temp 97.8 F (36.6 C)   Resp 16   Ht 5' (1.524 m)   Wt 51.3 kg   LMP 11/15/2022 (Approximate)   SpO2 100%   BMI 22.07 kg/m   Physical Exam Vitals and nursing note reviewed.  Constitutional:      Appearance: She is well-developed.  HENT:     Head: Normocephalic and atraumatic.  Eyes:     Conjunctiva/sclera: Conjunctivae normal.  Pulmonary:     Effort: No respiratory distress.  Musculoskeletal:     Cervical back: Normal range of motion and neck supple.  Skin:    General: Skin is warm and dry.  Neurological:     Mental Status: She is alert.     ED Results / Procedures / Treatments   Labs (all labs ordered are listed, but only abnormal results are displayed) Labs Reviewed  CBG MONITORING, ED - Abnormal; Notable for the following components:      Result Value   Glucose-Capillary 357 (*)    All other components within normal limits  CBG MONITORING, ED - Abnormal; Notable for the following components:   Glucose-Capillary 334 (*)    All other components within  normal limits  PREGNANCY, URINE  CBG MONITORING, ED    EKG None  Radiology No results found.  Procedures Procedures    Medications Ordered in ED Medications - No data to display  ED Course/ Medical Decision Making/ A&P    Patient seen and examined. History obtained directly from patient.  Reviewed recent endocrinology note from earlier this month.  Labs/EKG: Personally reviewed and interpreted CBG 83, pregnancy negative.  Imaging: None ordered  Medications/Fluids: None ordered Most recent vital signs reviewed and are as follows: BP (!) 131/97   Pulse 92   Temp 97.8 F (36.6 C)   Resp 16   Ht 5' (1.524 m)   Wt 51.3 kg   LMP 11/15/2022 (Approximate)   SpO2 100%   BMI 22.07 kg/m   Initial impression: Patient currently looks well, alert.  Will have her eat and drink, monitor sugars.  4:03 PM Reassessment performed. Patient appears stable.  Sugars have been running in the 300s.  Her insulin pump is back on and functioning.  She does not have concerns about discharge.  She has not developed any vomiting or abdominal pains.  Labs personally reviewed and interpreted including: CBG 334, 357.  Pregnancy test negative.  Reviewed pertinent lab work and imaging with patient at bedside. Questions answered.   Most current vital signs reviewed and are as follows: BP 128/80 (BP Location: Right Arm)   Pulse 85   Temp 97.8 F (36.6 C)   Resp 16   Ht 5' (1.524 m)   Wt 51.3 kg   LMP 11/15/2022 (Approximate)   SpO2 100%   BMI 22.07 kg/m   Plan: Discharge to home.  Will plan for discharged home.  Patient encouraged to use lower doses of fixed dose insulin if she requires this.  Encouraged follow-up with her endocrinologist in the next 24 hours for further instructions.  Prescriptions written for: None  Other home care instructions discussed: Maintain hydration, monitor sugars closely  ED return instructions discussed: Return with extreme high or low blood sugar,  development of vomiting, abdominal pain, altered mental status.                                Medical Decision Making Amount and/or Complexity of Data Reviewed Labs: ordered.   Patient presented with episode of hypoglycemia today.  She has had episodes of similar recently per her endocrinology notes.  She did administer fixed dose insulin today per her history and did not eat and drink much.  Suspect that this was the cause of the patient's symptoms.  She was monitored in the emergency department and blood sugars increased to the  300 level.  No concern for complications for hyperglycemia at this time.  Patient looks well, is back to her baseline, asymptomatic.  Her insulin pump is active and functioning.  She has no concerns about being discharged.  She will continue home insulin, encouraged endocrinology follow-up.           Final Clinical Impression(s) / ED Diagnoses Final diagnoses:  Hypoglycemia    Rx / DC Orders ED Discharge Orders     None         Renne Crigler, PA-C 11/29/22 1611    Vanetta Mulders, MD 11/30/22 1529

## 2022-12-12 ENCOUNTER — Encounter: Payer: Self-pay | Admitting: Student

## 2022-12-12 ENCOUNTER — Ambulatory Visit: Payer: MEDICAID | Admitting: Podiatry

## 2022-12-12 ENCOUNTER — Other Ambulatory Visit (HOSPITAL_COMMUNITY)
Admission: RE | Admit: 2022-12-12 | Discharge: 2022-12-12 | Disposition: A | Discharge status: Home / Self Care | Payer: BLUE CROSS/BLUE SHIELD | Source: Ambulatory Visit | Attending: Family Medicine | Admitting: Family Medicine

## 2022-12-12 ENCOUNTER — Ambulatory Visit (INDEPENDENT_AMBULATORY_CARE_PROVIDER_SITE_OTHER): Payer: MEDICAID | Admitting: Student

## 2022-12-12 VITALS — BP 134/83 | HR 114 | Wt 118.2 lb

## 2022-12-12 DIAGNOSIS — Z3200 Encounter for pregnancy test, result unknown: Secondary | ICD-10-CM

## 2022-12-12 DIAGNOSIS — Z708 Other sex counseling: Secondary | ICD-10-CM | POA: Insufficient documentation

## 2022-12-12 DIAGNOSIS — E109 Type 1 diabetes mellitus without complications: Secondary | ICD-10-CM

## 2022-12-12 DIAGNOSIS — Z30011 Encounter for initial prescription of contraceptive pills: Secondary | ICD-10-CM

## 2022-12-12 DIAGNOSIS — Z113 Encounter for screening for infections with a predominantly sexual mode of transmission: Secondary | ICD-10-CM

## 2022-12-12 LAB — POCT URINE PREGNANCY: Preg Test, Ur: NEGATIVE

## 2022-12-12 LAB — POCT GLYCOSYLATED HEMOGLOBIN (HGB A1C): HbA1c, POC (controlled diabetic range): 9.5 % — AB (ref 0.0–7.0)

## 2022-12-12 MED ORDER — NORGESTIMATE-ETH ESTRADIOL 0.25-35 MG-MCG PO TABS: 1.0000 | ORAL_TABLET | Freq: Every day | ORAL | 11 refills | Status: DC

## 2022-12-12 NOTE — Patient Instructions (Addendum)
It was great seeing you today.  As we discussed, - We completed testing today. I will call you if anything is abnormal.   If you have any questions or concerns, please feel free to call the clinic.   Have a wonderful day,  Dr. Darral Dash [facility] [phone]

## 2022-12-12 NOTE — Progress Notes (Unsigned)
    SUBJECTIVE:   CHIEF COMPLAINT / HPI:   Dawn [name] is a 22 year-old female who is here with request for STI testing.  T1DM No nausea, vomiting. Feeling generally well. Having a hard time feeling like she does not have good family support. Per endocrinologist (who primarily manages her diabetes) she has been having hypoglycemia to 6%.   STI screening LMP 11/20-11/21. Last sexually active in September. Reports she had chlamydia and wants test of cure today.  Reports nipple soreness. Not using anything for contraception.  PERTINENT  PMH / PSH: ***  OBJECTIVE:   LMP 11/15/2022 (Approximate)  ***   ASSESSMENT/PLAN:   No problem-specific Assessment & Plan notes found for this encounter.     Darral Dash, DO [facility]    {    This will disappear when note is signed, click to select method of visit    :1}

## 2022-12-13 LAB — MICROALBUMIN / CREATININE URINE RATIO
Creatinine, Urine: 110.5 mg/dL
Microalb/Creat Ratio: 40 mg/g{creat} — ABNORMAL HIGH (ref 0–29)
Microalbumin, Urine: 44.1 ug/mL

## 2022-12-14 LAB — CERVICOVAGINAL ANCILLARY ONLY
Bacterial Vaginitis (gardnerella): NEGATIVE
Candida Glabrata: POSITIVE — AB
Candida Vaginitis: NEGATIVE
Chlamydia: NEGATIVE
Comment: NEGATIVE
Comment: NEGATIVE
Comment: NEGATIVE
Comment: NEGATIVE
Comment: NEGATIVE
Comment: NORMAL
Neisseria Gonorrhea: NEGATIVE
Trichomonas: NEGATIVE

## 2022-12-15 ENCOUNTER — Encounter (HOSPITAL_COMMUNITY): Payer: Self-pay | Admitting: Emergency Medicine

## 2022-12-15 ENCOUNTER — Observation Stay (HOSPITAL_COMMUNITY)
Admission: EM | Admit: 2022-12-15 | Discharge: 2022-12-17 | Disposition: A | Discharge status: Home / Self Care | Payer: MEDICAID | Attending: Student | Admitting: Student

## 2022-12-15 DIAGNOSIS — R079 Chest pain, unspecified: Secondary | ICD-10-CM | POA: Diagnosis not present

## 2022-12-15 DIAGNOSIS — Z309 Encounter for contraceptive management, unspecified: Secondary | ICD-10-CM | POA: Insufficient documentation

## 2022-12-15 DIAGNOSIS — Z62810 Personal history of physical and sexual abuse in childhood: Secondary | ICD-10-CM

## 2022-12-15 DIAGNOSIS — Z91013 Allergy to seafood: Secondary | ICD-10-CM

## 2022-12-15 DIAGNOSIS — Z794 Long term (current) use of insulin: Secondary | ICD-10-CM | POA: Diagnosis not present

## 2022-12-15 DIAGNOSIS — R000 Tachycardia, unspecified: Secondary | ICD-10-CM | POA: Diagnosis present

## 2022-12-15 DIAGNOSIS — Z79899 Other long term (current) drug therapy: Secondary | ICD-10-CM

## 2022-12-15 DIAGNOSIS — R944 Abnormal results of kidney function studies: Secondary | ICD-10-CM | POA: Diagnosis present

## 2022-12-15 DIAGNOSIS — Z8249 Family history of ischemic heart disease and other diseases of the circulatory system: Secondary | ICD-10-CM

## 2022-12-15 DIAGNOSIS — J45909 Unspecified asthma, uncomplicated: Secondary | ICD-10-CM | POA: Diagnosis present

## 2022-12-15 DIAGNOSIS — F909 Attention-deficit hyperactivity disorder, unspecified type: Secondary | ICD-10-CM | POA: Diagnosis present

## 2022-12-15 DIAGNOSIS — Z113 Encounter for screening for infections with a predominantly sexual mode of transmission: Secondary | ICD-10-CM | POA: Insufficient documentation

## 2022-12-15 DIAGNOSIS — F32A Depression, unspecified: Secondary | ICD-10-CM | POA: Diagnosis present

## 2022-12-15 DIAGNOSIS — Z833 Family history of diabetes mellitus: Secondary | ICD-10-CM

## 2022-12-15 DIAGNOSIS — Z91040 Latex allergy status: Secondary | ICD-10-CM

## 2022-12-15 DIAGNOSIS — E1065 Type 1 diabetes mellitus with hyperglycemia: Principal | ICD-10-CM | POA: Diagnosis present

## 2022-12-15 DIAGNOSIS — Z793 Long term (current) use of hormonal contraceptives: Secondary | ICD-10-CM

## 2022-12-15 DIAGNOSIS — Z8669 Personal history of other diseases of the nervous system and sense organs: Secondary | ICD-10-CM

## 2022-12-15 DIAGNOSIS — N926 Irregular menstruation, unspecified: Secondary | ICD-10-CM | POA: Diagnosis present

## 2022-12-15 DIAGNOSIS — Z9641 Presence of insulin pump (external) (internal): Secondary | ICD-10-CM | POA: Insufficient documentation

## 2022-12-15 DIAGNOSIS — R799 Abnormal finding of blood chemistry, unspecified: Secondary | ICD-10-CM

## 2022-12-15 DIAGNOSIS — Z825 Family history of asthma and other chronic lower respiratory diseases: Secondary | ICD-10-CM

## 2022-12-15 DIAGNOSIS — E1110 Type 2 diabetes mellitus with ketoacidosis without coma: Secondary | ICD-10-CM | POA: Diagnosis present

## 2022-12-15 DIAGNOSIS — E860 Dehydration: Secondary | ICD-10-CM | POA: Diagnosis present

## 2022-12-15 DIAGNOSIS — F4321 Adjustment disorder with depressed mood: Secondary | ICD-10-CM | POA: Diagnosis present

## 2022-12-15 DIAGNOSIS — E1010 Type 1 diabetes mellitus with ketoacidosis without coma: Principal | ICD-10-CM | POA: Insufficient documentation

## 2022-12-15 DIAGNOSIS — Z9109 Other allergy status, other than to drugs and biological substances: Secondary | ICD-10-CM

## 2022-12-15 DIAGNOSIS — R102 Pelvic and perineal pain: Secondary | ICD-10-CM | POA: Insufficient documentation

## 2022-12-15 DIAGNOSIS — E1165 Type 2 diabetes mellitus with hyperglycemia: Secondary | ICD-10-CM | POA: Diagnosis not present

## 2022-12-15 DIAGNOSIS — G43011 Migraine without aura, intractable, with status migrainosus: Secondary | ICD-10-CM | POA: Diagnosis present

## 2022-12-15 DIAGNOSIS — R32 Unspecified urinary incontinence: Secondary | ICD-10-CM | POA: Diagnosis present

## 2022-12-15 LAB — CBC
HCT: 40 % (ref 36.0–46.0)
Hemoglobin: 12.3 g/dL (ref 12.0–15.0)
MCH: 24.9 pg — ABNORMAL LOW (ref 26.0–34.0)
MCHC: 30.8 g/dL (ref 30.0–36.0)
MCV: 81 fL (ref 80.0–100.0)
Platelets: 395 10*3/uL (ref 150–400)
RBC: 4.94 MIL/uL (ref 3.87–5.11)
RDW: 15.6 % — ABNORMAL HIGH (ref 11.5–15.5)
WBC: 15.4 10*3/uL — ABNORMAL HIGH (ref 4.0–10.5)
nRBC: 0 % (ref 0.0–0.2)

## 2022-12-15 LAB — I-STAT CHEM 8, ED
BUN: 50 mg/dL — ABNORMAL HIGH (ref 6–20)
Calcium, Ion: 1.1 mmol/L — ABNORMAL LOW (ref 1.15–1.40)
Chloride: 104 mmol/L (ref 98–111)
Creatinine, Ser: 0.9 mg/dL (ref 0.44–1.00)
Glucose, Bld: 431 mg/dL — ABNORMAL HIGH (ref 70–99)
HCT: 42 % (ref 36.0–46.0)
Hemoglobin: 14.3 g/dL (ref 12.0–15.0)
Potassium: 4.8 mmol/L (ref 3.5–5.1)
Sodium: 139 mmol/L (ref 135–145)
TCO2: 22 mmol/L (ref 22–32)

## 2022-12-15 MED ORDER — LACTATED RINGERS IV BOLUS
20.0000 mL/kg | Freq: Once | INTRAVENOUS | Status: AC
  Administered 2022-12-16: 1072 mL via INTRAVENOUS

## 2022-12-15 MED ORDER — ONDANSETRON HCL 4 MG/2ML IJ SOLN
4.0000 mg | Freq: Once | INTRAMUSCULAR | Status: AC
  Administered 2022-12-16: 4 mg via INTRAVENOUS
  Filled 2022-12-15: qty 2

## 2022-12-15 NOTE — Assessment & Plan Note (Signed)
Patient currently engaging in unprotected sexual intercourse, discussed various options including IUD, Nexplanon, oral contraceptives.   Prescribed combined oral OCP today.  Patient does not have any contraindications including history of blood clots, migraine with aura.

## 2022-12-15 NOTE — ED Provider Notes (Signed)
[place] EMERGENCY DEPARTMENT AT [facility] Provider Note   CSN: [id number] Arrival date & time: 12/15/22  2317     History  Chief Complaint  Patient presents with   Hyperglycemia    Dawn [name] is a 22 y.o. female.  The history is provided by the patient.  Hyperglycemia She has a history of type 1 diabetes with episodes of ketoacidosis, attention deficit disorder and comes in complaining of nausea for 2 days and vomiting today.  Her home glucose readings have been high today.  She denies fever but has had some chills and sweats.  She denies any diarrhea.   Home Medications Prior to Admission medications   Medication Sig Start Date End Date Taking? Authorizing Provider  Continuous Glucose Sensor (DEXCOM G7 SENSOR) MISC Inject 1 Application into the skin as directed. 10/30/22   Ivery Quale, MD  escitalopram (LEXAPRO) 10 MG tablet Take 10 mg by mouth daily. 06/08/22   [provider]  ferrous sulfate 324 MG TBEC Take 1 tablet (324 mg total) by mouth every other day. 11/02/22   Fortunato Curling, DO  Glucagon (BAQSIMI TWO PACK) 3 MG/DOSE POWD Place 3 mg into the nose as needed (low blood sugar).    [provider]  Glucagon (GVOKE HYPOPEN 2-PACK) 1 MG/0.2ML SOAJ Inject 1 mg into the skin as needed for up to 2 doses (Severe low blood sugar). 07/16/22   Noralee Stain, DO  Insulin Disposable Pump (OMNIPOD DASH PODS, GEN 4,) MISC SMARTSIG:SUB-Q Every 3 Days 11/20/22   [provider]  Insulin Human (INSULIN PUMP) SOLN Inject into the skin.    [provider]  insulin lispro (HUMALOG) 100 UNIT/ML KwikPen Inject 3 Units into the skin 3 (three) times daily with meals. If eating and Blood Glucose (BG) 80 or higher inject 3 units for meal coverage and add correction dose per scale. If not eating, correction dose only. BG <150= 0 unit; BG 150-200= 1 unit; BG 201-250= 2 unit; BG 251-300= 3 unit; BG 301-350= 4 unit; BG 351-400= 5 unit; BG >400= 6 unit  and Call Primary Care. Patient not taking: Reported on 10/27/2022 07/16/22   Noralee Stain, DO  norgestimate-ethinyl estradiol (SPRINTEC 28) 0.25-35 MG-MCG tablet Take 1 tablet by mouth daily. 12/12/22   Dameron, Nolberto Hanlon, DO  SUMAtriptan (IMITREX) 25 MG tablet Take 0.5 tablets (12.5 mg total) by mouth every 2 (two) hours as needed for migraine. May repeat in 2 hours if headache persists or recurs. 04/12/21   Dana Allan, MD  [name] FLEXTOUCH 100 UNIT/ML FlexTouch Pen Inject 30 Units into the skin daily.    [provider]  Vitamin D, Ergocalciferol, (DRISDOL) 1.25 MG (50000 UNIT) CAPS capsule Take 50,000 Units by mouth every 7 (seven) days. 05/24/22   [provider]      Allergies    Latex, Tape, and Shellfish allergy    Review of Systems   Review of Systems  All other systems reviewed and are negative.   Physical Exam Updated Vital Signs BP 132/83   Pulse (!) 117   Temp 98.1 F (36.7 C) (Oral)   Resp 18   Ht 5' (1.524 m)   Wt 53.6 kg   LMP 11/15/2022 (Approximate)   SpO2 100%   BMI 23.08 kg/m  Physical Exam Vitals and nursing note reviewed.   22 year old female, appears mildly uncomfortable with slightly fast respirations, but is in no acute distress. Vital signs are significant for elevated heart rate. Oxygen saturation is 100%,  which is normal. Head is normocephalic and atraumatic. PERRLA, EOMI. Oropharynx is clear.  Mucous membranes are dry. Neck is nontender and supple. Lungs are clear without rales, wheezes, or rhonchi. Chest is nontender. Heart has regular rate and rhythm without murmur. Abdomen is soft, flat, nontender. Extremities have no cyanosis or edema, full range of motion is present. Skin is warm and dry without rash. Neurologic: Mental status is normal, moves all extremities equally.  ED Results / Procedures / Treatments   Labs (all labs ordered are listed, but only abnormal results are displayed) Labs Reviewed  CBC - Abnormal; Notable  for the following components:      Result Value   WBC 15.4 (*)    MCH 24.9 (*)    RDW 15.6 (*)    All other components within normal limits  BETA-HYDROXYBUTYRIC ACID - Abnormal; Notable for the following components:   Beta-Hydroxybutyric Acid 3.63 (*)    All other components within normal limits  BLOOD GAS, VENOUS - Abnormal; Notable for the following components:   pCO2, Ven 40 (*)    All other components within normal limits  COMPREHENSIVE METABOLIC PANEL - Abnormal; Notable for the following components:   CO2 19 (*)    Glucose, Bld 438 (*)    BUN 42 (*)    Total Protein 9.3 (*)    Anion gap 17 (*)    All other components within normal limits  MAGNESIUM - Abnormal; Notable for the following components:   Magnesium 3.0 (*)    All other components within normal limits  CBG MONITORING, ED - Abnormal; Notable for the following components:   Glucose-Capillary 375 (*)    All other components within normal limits  I-STAT CHEM 8, ED - Abnormal; Notable for the following components:   BUN 50 (*)    Glucose, Bld 431 (*)    Calcium, Ion 1.10 (*)    All other components within normal limits  HCG, SERUM, QUALITATIVE  LIPASE, BLOOD  URINALYSIS, ROUTINE W REFLEX MICROSCOPIC  CBG MONITORING, ED    EKG ED ECG REPORT   Date: 12/16/2022  Rate: 139  Rhythm: sinus tachycardia  QRS Axis: normal  Intervals: normal  ST/T Wave abnormalities: normal  Conduction Disutrbances:none  Narrative Interpretation: Sinus tachycardia. When compared with ECG of 08/12/2022, no significant changes are seen.  Old EKG Reviewed: unchanged  I have personally reviewed the EKG tracing and agree with the computerized printout as noted.  Radiology DG Chest Portable 1 View Result Date: 12/16/2022 CLINICAL DATA:  Hyperglycemia EXAM: PORTABLE CHEST 1 VIEW COMPARISON:  10/01/2022 FINDINGS: Lungs are clear.  No pleural effusion or pneumothorax. The heart is normal in size. IMPRESSION: No acute cardiopulmonary  disease. Electronically Signed   By: Charline Bills M.D.   On: 12/16/2022 00:15    Procedures Procedures  Cardiac monitor shows normal sinus rhythm, per my interpretation.  Medications Ordered in ED Medications  lactated ringers bolus 1,072 mL (has no administration in time range)  ondansetron (ZOFRAN) injection 4 mg (has no administration in time range)    ED Course/ Medical Decision Making/ A&P                                 Medical Decision Making Amount and/or Complexity of Data Reviewed Labs: ordered. Radiology: ordered.  Risk Prescription drug management. Decision regarding hospitalization.   Nausea and vomiting and hyperglycemia concerning for ketoacidosis.  Consider viral gastroenteritis.  I  have reviewed her past records, and do note hospital admission on 10/01/2022 for ketoacidosis.  I have ordered IV fluids, ondansetron for nausea.  I have ordered screening labs for ketoacidosis.  Chest x-ray shows no acute process.  Have independently viewed the images, and agree with the radiologist's interpretation.  I have reviewed her electrocardiogram, and my interpretation is sinus tachycardia and unchanged from prior.  I have reviewed her laboratory tests, and my interpretation is leukocytosis which is felt to be stress related, normal pH, metabolic panel consistent with dehydration and ketoacidosis with CO2 of 19 and slightly elevated anion gap, BUN 42 compared with creatinine 1.0.  Lipase is normal magnesium elevated likely secondary to dehydration.  Elevated beta hydroxybutyrate level consistent with early DKA.  I discussed case with Dr. Haroldine Laws of [facility], who agrees to admit the patient.  CRITICAL CARE Performed by: Dione Booze Total critical care time: 50 minutes Critical care time was exclusive of separately billable procedures and treating other patients. Critical care was necessary to treat or prevent imminent or life-threatening deterioration. Critical  care was time spent personally by me on the following activities: development of treatment plan with patient and/or surrogate as well as nursing, discussions with consultants, evaluation of patient's response to treatment, examination of patient, obtaining history from patient or surrogate, ordering and performing treatments and interventions, ordering and review of laboratory studies, ordering and review of radiographic studies, pulse oximetry and re-evaluation of patient's condition.  Final Clinical Impression(s) / ED Diagnoses Final diagnoses:  Type 1 diabetes mellitus with ketoacidosis without coma (HCC)  Elevated BUN    Rx / DC Orders ED Discharge Orders     None         Dione Booze, MD 12/16/22 [phone]

## 2022-12-15 NOTE — Assessment & Plan Note (Signed)
Negative for STI today, test of cure negative for chlamydia. Results relayed to patient.

## 2022-12-15 NOTE — ED Triage Notes (Signed)
2 days nausea, vomiting and belly pain in lower quadrant. Typically it is in upper stomach when she is in DKA. Unknown LMP (1 day last month only) Tender bilaterally. CBG 531. Type 1 diabetic.

## 2022-12-16 ENCOUNTER — Emergency Department (HOSPITAL_COMMUNITY): Payer: MEDICAID

## 2022-12-16 ENCOUNTER — Inpatient Hospital Stay (HOSPITAL_COMMUNITY): Payer: MEDICAID

## 2022-12-16 ENCOUNTER — Other Ambulatory Visit: Payer: Self-pay

## 2022-12-16 DIAGNOSIS — F909 Attention-deficit hyperactivity disorder, unspecified type: Secondary | ICD-10-CM | POA: Diagnosis present

## 2022-12-16 DIAGNOSIS — Z8669 Personal history of other diseases of the nervous system and sense organs: Secondary | ICD-10-CM | POA: Diagnosis not present

## 2022-12-16 DIAGNOSIS — J4520 Mild intermittent asthma, uncomplicated: Secondary | ICD-10-CM | POA: Diagnosis not present

## 2022-12-16 DIAGNOSIS — R102 Pelvic and perineal pain: Secondary | ICD-10-CM | POA: Diagnosis present

## 2022-12-16 DIAGNOSIS — Z8249 Family history of ischemic heart disease and other diseases of the circulatory system: Secondary | ICD-10-CM | POA: Diagnosis not present

## 2022-12-16 DIAGNOSIS — E1010 Type 1 diabetes mellitus with ketoacidosis without coma: Secondary | ICD-10-CM | POA: Diagnosis not present

## 2022-12-16 DIAGNOSIS — E860 Dehydration: Secondary | ICD-10-CM | POA: Diagnosis present

## 2022-12-16 DIAGNOSIS — G43011 Migraine without aura, intractable, with status migrainosus: Secondary | ICD-10-CM | POA: Diagnosis present

## 2022-12-16 DIAGNOSIS — F4321 Adjustment disorder with depressed mood: Secondary | ICD-10-CM | POA: Diagnosis present

## 2022-12-16 DIAGNOSIS — E1110 Type 2 diabetes mellitus with ketoacidosis without coma: Secondary | ICD-10-CM | POA: Diagnosis present

## 2022-12-16 DIAGNOSIS — Z62810 Personal history of physical and sexual abuse in childhood: Secondary | ICD-10-CM | POA: Diagnosis not present

## 2022-12-16 DIAGNOSIS — R32 Unspecified urinary incontinence: Secondary | ICD-10-CM | POA: Diagnosis present

## 2022-12-16 DIAGNOSIS — Z9641 Presence of insulin pump (external) (internal): Secondary | ICD-10-CM | POA: Diagnosis present

## 2022-12-16 DIAGNOSIS — E1065 Type 1 diabetes mellitus with hyperglycemia: Secondary | ICD-10-CM | POA: Diagnosis present

## 2022-12-16 DIAGNOSIS — N926 Irregular menstruation, unspecified: Secondary | ICD-10-CM | POA: Diagnosis present

## 2022-12-16 DIAGNOSIS — F32A Depression, unspecified: Secondary | ICD-10-CM | POA: Diagnosis present

## 2022-12-16 DIAGNOSIS — Z825 Family history of asthma and other chronic lower respiratory diseases: Secondary | ICD-10-CM | POA: Diagnosis not present

## 2022-12-16 DIAGNOSIS — R944 Abnormal results of kidney function studies: Secondary | ICD-10-CM | POA: Diagnosis present

## 2022-12-16 DIAGNOSIS — Z91013 Allergy to seafood: Secondary | ICD-10-CM | POA: Diagnosis not present

## 2022-12-16 DIAGNOSIS — Z91040 Latex allergy status: Secondary | ICD-10-CM | POA: Diagnosis not present

## 2022-12-16 DIAGNOSIS — J45909 Unspecified asthma, uncomplicated: Secondary | ICD-10-CM | POA: Diagnosis present

## 2022-12-16 DIAGNOSIS — Z793 Long term (current) use of hormonal contraceptives: Secondary | ICD-10-CM | POA: Diagnosis not present

## 2022-12-16 DIAGNOSIS — Z794 Long term (current) use of insulin: Secondary | ICD-10-CM | POA: Diagnosis not present

## 2022-12-16 DIAGNOSIS — Z79899 Other long term (current) drug therapy: Secondary | ICD-10-CM | POA: Diagnosis not present

## 2022-12-16 DIAGNOSIS — Z9109 Other allergy status, other than to drugs and biological substances: Secondary | ICD-10-CM | POA: Diagnosis not present

## 2022-12-16 DIAGNOSIS — R000 Tachycardia, unspecified: Secondary | ICD-10-CM | POA: Diagnosis present

## 2022-12-16 DIAGNOSIS — Z833 Family history of diabetes mellitus: Secondary | ICD-10-CM | POA: Diagnosis not present

## 2022-12-16 DIAGNOSIS — E1165 Type 2 diabetes mellitus with hyperglycemia: Secondary | ICD-10-CM | POA: Diagnosis present

## 2022-12-16 LAB — MAGNESIUM
Magnesium: 2.7 mg/dL — ABNORMAL HIGH (ref 1.7–2.4)
Magnesium: 3 mg/dL — ABNORMAL HIGH (ref 1.7–2.4)

## 2022-12-16 LAB — COMPREHENSIVE METABOLIC PANEL
ALT: 18 U/L (ref 0–44)
AST: 19 U/L (ref 15–41)
Albumin: 4.5 g/dL (ref 3.5–5.0)
Alkaline Phosphatase: 126 U/L (ref 38–126)
Anion gap: 17 — ABNORMAL HIGH (ref 5–15)
BUN: 42 mg/dL — ABNORMAL HIGH (ref 6–20)
CO2: 19 mmol/L — ABNORMAL LOW (ref 22–32)
Calcium: 9.7 mg/dL (ref 8.9–10.3)
Chloride: 101 mmol/L (ref 98–111)
Creatinine, Ser: 1 mg/dL (ref 0.44–1.00)
GFR, Estimated: >60 mL/min (ref >60)
Glucose, Bld: 438 mg/dL — ABNORMAL HIGH (ref 70–99)
Potassium: 4.3 mmol/L (ref 3.5–5.1)
Sodium: 137 mmol/L (ref 135–145)
Total Bilirubin: 0.9 mg/dL (ref <1.2)
Total Protein: 9.3 g/dL — ABNORMAL HIGH (ref 6.5–8.1)

## 2022-12-16 LAB — URINALYSIS, ROUTINE W REFLEX MICROSCOPIC
Bacteria, UA: NONE SEEN
Bilirubin Urine: NEGATIVE
Glucose, UA: >=500 mg/dL — AB
Hgb urine dipstick: NEGATIVE
Ketones, ur: 20 mg/dL — AB
Leukocytes,Ua: NEGATIVE
Nitrite: NEGATIVE
Protein, ur: 100 mg/dL — AB
Specific Gravity, Urine: 1.025 (ref 1.005–1.030)
pH: 6 (ref 5.0–8.0)

## 2022-12-16 LAB — BASIC METABOLIC PANEL
Anion gap: 14 (ref 5–15)
BUN: 35 mg/dL — ABNORMAL HIGH (ref 6–20)
CO2: 22 mmol/L (ref 22–32)
Calcium: 9.3 mg/dL (ref 8.9–10.3)
Chloride: 103 mmol/L (ref 98–111)
Creatinine, Ser: 0.82 mg/dL (ref 0.44–1.00)
GFR, Estimated: >60 mL/min (ref >60)
Glucose, Bld: 215 mg/dL — ABNORMAL HIGH (ref 70–99)
Potassium: 3.8 mmol/L (ref 3.5–5.1)
Sodium: 139 mmol/L (ref 135–145)

## 2022-12-16 LAB — GLUCOSE, CAPILLARY
Glucose-Capillary: 141 mg/dL — ABNORMAL HIGH (ref 70–99)
Glucose-Capillary: 126 mg/dL — ABNORMAL HIGH (ref 70–99)

## 2022-12-16 LAB — CBG MONITORING, ED
Glucose-Capillary: 144 mg/dL — ABNORMAL HIGH (ref 70–99)
Glucose-Capillary: 149 mg/dL — ABNORMAL HIGH (ref 70–99)
Glucose-Capillary: 157 mg/dL — ABNORMAL HIGH (ref 70–99)
Glucose-Capillary: 167 mg/dL — ABNORMAL HIGH (ref 70–99)
Glucose-Capillary: 170 mg/dL — ABNORMAL HIGH (ref 70–99)
Glucose-Capillary: 199 mg/dL — ABNORMAL HIGH (ref 70–99)
Glucose-Capillary: 200 mg/dL — ABNORMAL HIGH (ref 70–99)
Glucose-Capillary: 201 mg/dL — ABNORMAL HIGH (ref 70–99)
Glucose-Capillary: 238 mg/dL — ABNORMAL HIGH (ref 70–99)
Glucose-Capillary: 375 mg/dL — ABNORMAL HIGH (ref 70–99)

## 2022-12-16 LAB — LIPASE, BLOOD: Lipase: 30 U/L (ref 11–51)

## 2022-12-16 LAB — PHOSPHORUS: Phosphorus: 3 mg/dL (ref 2.5–4.6)

## 2022-12-16 LAB — HEMOGLOBIN A1C
Hgb A1c MFr Bld: 9.6 % — ABNORMAL HIGH (ref 4.8–5.6)
Mean Plasma Glucose: 228.82 mg/dL

## 2022-12-16 LAB — BLOOD GAS, VENOUS
Acid-Base Excess: 1.3 mmol/L (ref 0.0–2.0)
Bicarbonate: 25.9 mmol/L (ref 20.0–28.0)
O2 Saturation: 65.4 %
Patient temperature: 37
pCO2, Ven: 40 mm[Hg] — ABNORMAL LOW (ref 44–60)
pH, Ven: 7.42 (ref 7.25–7.43)
pO2, Ven: 42 mm[Hg] (ref 32–45)

## 2022-12-16 LAB — HCG, SERUM, QUALITATIVE: Preg, Serum: NEGATIVE

## 2022-12-16 LAB — BETA-HYDROXYBUTYRIC ACID: Beta-Hydroxybutyric Acid: 3.63 mmol/L — ABNORMAL HIGH (ref 0.05–0.27)

## 2022-12-16 MED ORDER — MORPHINE SULFATE (PF) 4 MG/ML IV SOLN: 4.0000 mg | INTRAVENOUS | Status: DC | PRN

## 2022-12-16 MED ORDER — MORPHINE SULFATE (PF) 2 MG/ML IV SOLN
2.0000 mg | INTRAVENOUS | Status: DC | PRN
  Administered 2022-12-16: 2 mg via INTRAVENOUS
  Filled 2022-12-16: qty 1

## 2022-12-16 MED ORDER — INSULIN GLARGINE-YFGN 100 UNIT/ML ~~LOC~~ SOLN
30.0000 [IU] | Freq: Every day | SUBCUTANEOUS | Status: DC
  Administered 2022-12-16: 30 [IU] via SUBCUTANEOUS
  Filled 2022-12-16 (×2): qty 0.3

## 2022-12-16 MED ORDER — OXYCODONE HCL 5 MG PO TABS
5.0000 mg | ORAL_TABLET | Freq: Four times a day (QID) | ORAL | Status: DC | PRN
  Administered 2022-12-16 – 2022-12-17 (×3): 5 mg via ORAL
  Filled 2022-12-16 (×3): qty 1

## 2022-12-16 MED ORDER — ACETAMINOPHEN 325 MG PO TABS: 650.0000 mg | ORAL_TABLET | Freq: Four times a day (QID) | ORAL | Status: DC | PRN

## 2022-12-16 MED ORDER — SODIUM CHLORIDE 0.9% FLUSH
10.0000 mL | Freq: Two times a day (BID) | INTRAVENOUS | Status: DC
  Administered 2022-12-16 (×2): 10 mL via INTRAVENOUS

## 2022-12-16 MED ORDER — PROCHLORPERAZINE EDISYLATE 10 MG/2ML IJ SOLN
5.0000 mg | INTRAMUSCULAR | Status: DC | PRN
  Administered 2022-12-16: 5 mg via INTRAVENOUS
  Filled 2022-12-16: qty 2

## 2022-12-16 MED ORDER — KETOROLAC TROMETHAMINE 15 MG/ML IJ SOLN
15.0000 mg | Freq: Once | INTRAMUSCULAR | Status: AC
  Administered 2022-12-16: 15 mg via INTRAVENOUS
  Filled 2022-12-16: qty 1

## 2022-12-16 MED ORDER — INSULIN ASPART 100 UNIT/ML IJ SOLN
0.0000 [IU] | Freq: Three times a day (TID) | INTRAMUSCULAR | Status: DC
  Administered 2022-12-16 – 2022-12-17 (×3): 2 [IU] via SUBCUTANEOUS
  Filled 2022-12-16: qty 0.15

## 2022-12-16 MED ORDER — KETOROLAC TROMETHAMINE 15 MG/ML IJ SOLN
15.0000 mg | Freq: Four times a day (QID) | INTRAMUSCULAR | Status: AC
  Administered 2022-12-16 – 2022-12-17 (×3): 15 mg via INTRAVENOUS
  Filled 2022-12-16 (×3): qty 1

## 2022-12-16 MED ORDER — DEXTROSE 50 % IV SOLN: 0.0000 mL | INTRAVENOUS | Status: DC | PRN

## 2022-12-16 MED ORDER — SODIUM CHLORIDE 0.9% FLUSH
10.0000 mL | Freq: Two times a day (BID) | INTRAVENOUS | Status: DC
  Administered 2022-12-16 (×3): 10 mL via INTRAVENOUS

## 2022-12-16 MED ORDER — SODIUM CHLORIDE 0.9 % IV BOLUS
1000.0000 mL | Freq: Once | INTRAVENOUS | Status: AC
  Administered 2022-12-16: 1000 mL via INTRAVENOUS

## 2022-12-16 MED ORDER — LACTATED RINGERS IV SOLN: INTRAVENOUS | Status: DC

## 2022-12-16 MED ORDER — METOPROLOL TARTRATE 5 MG/5ML IV SOLN
5.0000 mg | Freq: Once | INTRAVENOUS | Status: AC
  Administered 2022-12-16: 5 mg via INTRAVENOUS
  Filled 2022-12-16: qty 5

## 2022-12-16 MED ORDER — ACETAMINOPHEN 650 MG RE SUPP: 650.0000 mg | Freq: Four times a day (QID) | RECTAL | Status: DC | PRN

## 2022-12-16 MED ORDER — ONDANSETRON HCL 4 MG/2ML IJ SOLN
4.0000 mg | Freq: Four times a day (QID) | INTRAMUSCULAR | Status: DC | PRN
  Administered 2022-12-16 (×3): 4 mg via INTRAVENOUS
  Filled 2022-12-16 (×3): qty 2

## 2022-12-16 MED ORDER — MORPHINE SULFATE (PF) 4 MG/ML IV SOLN
4.0000 mg | Freq: Once | INTRAVENOUS | Status: AC
  Administered 2022-12-16: 4 mg via INTRAVENOUS
  Filled 2022-12-16: qty 1

## 2022-12-16 MED ORDER — INSULIN REGULAR(HUMAN) IN NACL 100-0.9 UT/100ML-% IV SOLN
INTRAVENOUS | Status: DC
  Administered 2022-12-16: 7.5 [IU]/h via INTRAVENOUS
  Filled 2022-12-16: qty 100

## 2022-12-16 MED ORDER — POTASSIUM CHLORIDE 10 MEQ/100ML IV SOLN
10.0000 meq | INTRAVENOUS | Status: AC
  Administered 2022-12-16 (×2): 10 meq via INTRAVENOUS
  Filled 2022-12-16 (×2): qty 100

## 2022-12-16 MED ORDER — MORPHINE SULFATE (PF) 4 MG/ML IV SOLN
4.0000 mg | INTRAVENOUS | Status: DC | PRN
  Administered 2022-12-16: 4 mg via INTRAVENOUS
  Filled 2022-12-16 (×2): qty 1

## 2022-12-16 MED ORDER — ESCITALOPRAM OXALATE 20 MG PO TABS
20.0000 mg | ORAL_TABLET | Freq: Every day | ORAL | Status: DC
  Administered 2022-12-16 – 2022-12-17 (×2): 20 mg via ORAL
  Filled 2022-12-16: qty 2
  Filled 2022-12-16: qty 1

## 2022-12-16 MED ORDER — ENOXAPARIN SODIUM 40 MG/0.4ML IJ SOSY
40.0000 mg | PREFILLED_SYRINGE | INTRAMUSCULAR | Status: DC
  Administered 2022-12-16: 40 mg via SUBCUTANEOUS
  Filled 2022-12-16: qty 0.4

## 2022-12-16 MED ORDER — DEXTROSE IN LACTATED RINGERS 5 % IV SOLN: INTRAVENOUS | Status: DC

## 2022-12-16 MED ORDER — SODIUM CHLORIDE 0.9 % IV SOLN: INTRAVENOUS | Status: AC
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
## 2022-12-16 NOTE — ED Notes (Signed)
Patient difficult stick. RN unable to collect Blood. IV team consulted.

## 2022-12-16 NOTE — Inpatient Diabetes Management (Signed)
Inpatient Diabetes Program Recommendations  AACE/ADA: New Consensus Statement on Inpatient Glycemic Control (2015)  Target Ranges:  Prepandial:   less than 140 mg/dL      Peak postprandial:   less than 180 mg/dL (1-2 hours)      Critically ill patients:  140 - 180 mg/dL   Lab Results  Component Value Date   GLUCAP 144 (H) 12/16/2022   HGBA1C 9.5 (A) 12/12/2022    Review of Glycemic Control  Diabetes history: DM type 1 Outpatient Diabetes medications: Omnipod insulin pump with Dexcom G7 CGM, uses Humalog in insulin pump has [name] 30 units Daily as back up basal insulin in case of insulin pump failure Current orders for Inpatient glycemic control:  Semglee 30 units qhs Novolog 0-15 units tid  Pt will need meal coverage added when eating  Spoke with pt at bedside. Pt does not have her insulin pump on. Pt does report having her CGM on her right arm currently. Pt reporting severe abd pain and received heating pads from Nurse Tech. Relayed information to unit secretary who informed pt RN of pain.  Will watch glucose trends.  Thanks,  Christena Deem RN, MSN, BC-ADM Inpatient Diabetes Coordinator Team Pager [phone] (8a-5p)

## 2022-12-16 NOTE — Progress Notes (Addendum)
[facility] admitting physician addendum:  Not long-lasting relief from morphine 2 mg IVP as needed every 3 hours.  Oxycodone added for pain management now that the patient is tolerating oral intake.  Will adjust morphine to use for severe pain only.  Continue with ketorolac as ordered.  Her most recent BP 162/107 mmHg and HR 112-117.  Metoprolol 5 mg IVP ordered.  Pain control as above.  Sanda Klein, MD.
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
## 2022-12-17 DIAGNOSIS — R102 Pelvic and perineal pain: Secondary | ICD-10-CM

## 2022-12-17 DIAGNOSIS — E1065 Type 1 diabetes mellitus with hyperglycemia: Secondary | ICD-10-CM

## 2022-12-17 DIAGNOSIS — Z8669 Personal history of other diseases of the nervous system and sense organs: Secondary | ICD-10-CM | POA: Diagnosis not present

## 2022-12-17 DIAGNOSIS — N926 Irregular menstruation, unspecified: Secondary | ICD-10-CM | POA: Diagnosis not present

## 2022-12-17 DIAGNOSIS — F32A Depression, unspecified: Secondary | ICD-10-CM

## 2022-12-17 DIAGNOSIS — J4520 Mild intermittent asthma, uncomplicated: Secondary | ICD-10-CM

## 2022-12-17 DIAGNOSIS — R000 Tachycardia, unspecified: Secondary | ICD-10-CM

## 2022-12-17 LAB — GLUCOSE, CAPILLARY: Glucose-Capillary: 121 mg/dL — ABNORMAL HIGH (ref 70–99)

## 2022-12-17 MED ORDER — ONDANSETRON HCL 4 MG/2ML IJ SOLN: 4.0000 mg | Freq: Four times a day (QID) | INTRAMUSCULAR | Status: DC | PRN

## 2022-12-17 MED ORDER — METOCLOPRAMIDE HCL 5 MG/ML IJ SOLN: 5.0000 mg | Freq: Four times a day (QID) | INTRAMUSCULAR | Status: DC | PRN

## 2022-12-17 NOTE — Plan of Care (Signed)
  Problem: Education: Goal: Ability to describe self-care measures that may prevent or decrease complications (Diabetes Survival Skills Education) will improve Outcome: Adequate for Discharge Goal: Individualized Educational Video(s) Outcome: Adequate for Discharge   Problem: Coping: Goal: Ability to adjust to condition or change in health will improve Outcome: Adequate for Discharge   Problem: Fluid Volume: Goal: Ability to maintain a balanced intake and output will improve Outcome: Adequate for Discharge   Problem: Health Behavior/Discharge Planning: Goal: Ability to identify and utilize available resources and services will improve Outcome: Adequate for Discharge Goal: Ability to manage health-related needs will improve Outcome: Adequate for Discharge

## 2022-12-17 NOTE — Discharge Summary (Signed)
Physician Discharge Summary  Hanin Deida QQV:[id number] DOB: [birth year] DOA: 12/15/2022  PCP: Darral Dash, DO  Admit date: 12/15/2022 Discharge date: 12/17/2022 11:08 AM  Admitted From: Home Disposition: Home Recommendations for Outpatient Follow-up:  Follow up with PCP in 1 week Check glycemic control Please follow up on the following pending results: None  Home Health: No needs identified Equipment/Devices: No need identified  Discharge Condition: Stable CODE STATUS: Full code   Hospital course 22 year old F with PMH of type 1 diabetes on insulin pump and basal insulin as a backup, ADHD, asthma, migraine and urinary incontinence presented to ED with sudden onset right-sided abdominal pain, nausea, emesis and hyperglycemia and admitted with working diagnosis of diabetic ketoacidosis and pelvic pain.  Patient was in the process of moving but denies missing her insulin.    In ED, she was tachycardic to 130s and slightly hypertensive.  pH of 7.42.  BMP glucose 131.  Bicarb 19.  Anion gap 17.  BHA 3.63.  WBC 15.4.  Pregnancy test negative.  CXR without acute finding.  UA with glucosuria and 20 ketones.  Patient was given IV fluid bolus, antiemetics and IV Toradol, and admitted.  Pelvic ultrasound without significant finding but did not visualize ovaries.  The next day, patient's symptoms resolved.  CBG improved.  She tolerated regular diet.  She felt well and ready to go home.  She is discharged on home insulin pump with basal insulin as a backup.  Outpatient follow-up with PCP in 1 week.  See individual problem list below for more.   Problems addressed during this hospitalization Uncontrolled DM-1 with hyperglycemia: Unclear etiology of hyperglycemia but improved.  Patient reports good compliance with insulin.  DKA ruled out.  Nausea/vomiting/abdominal pain: Sudden onset abdominal pain on the day of admission.  Abdominal exam benign.  She has no focal tenderness.  Pelvic  ultrasound without significant finding but limited exam and did not visualize ovaries.  GI symptoms could be due to hypoglycemia.  Fortunately symptoms resolved.  Encouraged good hydration   Sinus tachycardia: Due to hyperglycemia, pain and dehydration.  Resolved.  Other chronic comorbidities: Stable            Time spent 35 minutes  Vital signs Vitals:   12/16/22 2127 12/17/22 0008 12/17/22 0545 12/17/22 0746  BP: 116/74 108/66 107/71 (!) 123/95  Pulse: 97 84 91 87  Temp: 98.4 F (36.9 C) 98.2 F (36.8 C) 98.2 F (36.8 C) 98.2 F (36.8 C)  Resp: 17 17 15 17   Height:      Weight:      SpO2: 99% 99% 97% 100%  TempSrc: Oral Oral Oral Oral  BMI (Calculated):         Discharge exam  GENERAL: No apparent distress.  Nontoxic. HEENT: MMM.  Vision and hearing grossly intact.  NECK: Supple.  No apparent JVD.  RESP:  No IWOB.  Fair aeration bilaterally. CVS:  RRR. Heart sounds normal.  ABD/GI/GU: BS+. Abd soft, NTND.  MSK/EXT:  Moves extremities. No apparent deformity. No edema.  SKIN: no apparent skin lesion or wound NEURO: Awake and alert. Oriented appropriately.  No apparent focal neuro deficit. PSYCH: Calm. Normal affect.  Discharge Instructions Discharge Instructions     Diet Carb Modified   Complete by: As directed    Discharge instructions   Complete by: As directed    It has been a pleasure taking care of you! You were hospitalized due to abdominal pain, nausea, vomiting and high blood sugar. Not clear  what caused your pain but your pain has improved. Be cautious with heavy lifting. Use your insulin as prescribed. Keep yourself hydrated. Your A1c is down to 9.6%. Keep up the good work! Please review your new medication list and the directions before you take your medications.  Follow up with your primary care doctor or endocrinologist in one to two weeks  Take care,   Increase activity slowly   Complete by: As directed       Allergies as of 12/17/2022        Reactions   Latex Itching, Swelling, Other (See Comments)   Skin irritation   Tape Other (See Comments)   Skin irritation   Shellfish Allergy Itching, Rash   Scallops specifically        Medication List     TAKE these medications    Baqsimi Two Pack 3 MG/DOSE Powd Generic drug: Glucagon Place 3 mg into the nose as needed (low blood sugar).   Dexcom G7 Sensor Misc Inject 1 Application into the skin as directed.   escitalopram 20 MG tablet Commonly known as: LEXAPRO Take 20 mg by mouth daily as needed.   ferrous sulfate 324 MG Tbec Take 1 tablet (324 mg total) by mouth every other day.   ibuprofen 800 MG tablet Commonly known as: ADVIL Take 800 mg by mouth every 6 (six) hours as needed.   Insulin Aspart FlexPen 100 UNIT/ML Commonly known as: NOVOLOG Inject 0-50 Units into the skin See admin instructions. INJECT 1 UNIT FOR EVERY 25G OF CARBS. PLUS 1 UNIT FOR EVERY 50 ABOVE 150 MG/DL PREMEALS. MAX DOSAGE 50U/DAY   insulin lispro 100 UNIT/ML KwikPen Commonly known as: HUMALOG Inject 3 Units into the skin 3 (three) times daily with meals. If eating and Blood Glucose (BG) 80 or higher inject 3 units for meal coverage and add correction dose per scale. If not eating, correction dose only. BG <150= 0 unit; BG 150-200= 1 unit; BG 201-250= 2 unit; BG 251-300= 3 unit; BG 301-350= 4 unit; BG 351-400= 5 unit; BG >400= 6 unit and Call Primary Care.   insulin pump Soln Inject 1 each into the skin in the morning, at noon, in the evening, and at bedtime.   medroxyPROGESTERone 150 MG/ML injection Commonly known as: DEPO-PROVERA Inject 150 mg into the muscle every 3 (three) months.   norgestimate-ethinyl estradiol 0.25-35 MG-MCG tablet Commonly known as: Sprintec 28 Take 1 tablet by mouth daily.   Omnipod DASH Pods (Gen 4) Misc SMARTSIG:SUB-Q Every 3 Days   SUMAtriptan 25 MG tablet Commonly known as: Imitrex Take 0.5 tablets (12.5 mg total) by mouth every 2 (two) hours as  needed for migraine. May repeat in 2 hours if headache persists or recurs.   [name] FlexTouch 100 UNIT/ML FlexTouch Pen Generic drug: insulin degludec Inject 30 Units into the skin daily.   Vitamin D (Ergocalciferol) 1.25 MG (50000 UNIT) Caps capsule Commonly known as: DRISDOL Take 50,000 Units by mouth every 7 (seven) days.        Consultations: None  Procedures/Studies:   US PELVIS (TRANSABDOMINAL ONLY) Result Date: 12/16/2022 CLINICAL DATA:  Several month history of pelvic pain EXAM: TRANSABDOMINAL ULTRASOUND OF PELVIS TECHNIQUE: Transabdominal ultrasound examination of the pelvis was performed including evaluation of the uterus, ovaries, adnexal regions, and pelvic cul-de-sac. COMPARISON:  None Available. FINDINGS: Uterus Measurements: 6.9 cm in sagittal dimension. No fibroids or other mass visualized. Endometrium Thickness: 7 mm.  No focal abnormality visualized. Right ovary Not seen. Left ovary Not seen.  Other findings:  No abnormal free fluid. IMPRESSION: 1. Technically challenging examination due to bowel gas. 2. Normal-appearing uterus and endometrium. 3. Nonvisualization of the ovaries. Electronically Signed   By: Agustin Cree M.D.   On: 12/16/2022 16:20   DG Chest Portable 1 View Result Date: 12/16/2022 CLINICAL DATA:  Hyperglycemia EXAM: PORTABLE CHEST 1 VIEW COMPARISON:  10/01/2022 FINDINGS: Lungs are clear.  No pleural effusion or pneumothorax. The heart is normal in size. IMPRESSION: No acute cardiopulmonary disease. Electronically Signed   By: Charline Bills M.D.   On: 12/16/2022 00:15       The results of significant diagnostics from this hospitalization (including imaging, microbiology, ancillary and laboratory) are listed below for reference.     Microbiology: No results found for this or any previous visit (from the past 240 hours).   Labs:  CBC: Recent Labs  Lab 12/15/22 2340 12/15/22 2346  WBC 15.4*  --   HGB 12.3 14.3  HCT 40.0 42.0  MCV 81.0   --   PLT 395  --    BMP &GFR Recent Labs  Lab 12/15/22 2340 12/15/22 2346 12/16/22 0427  NA 137 139 139  K 4.3 4.8 3.8  CL 101 104 103  CO2 19*  --  22  GLUCOSE 438* 431* 215*  BUN 42* 50* 35*  CREATININE 1.00 0.90 0.82  CALCIUM 9.7  --  9.3  MG 3.0*  --  2.7*  PHOS  --   --  3.0   Estimated Creatinine Clearance: 78 mL/min (by C-G formula based on SCr of 0.82 mg/dL). Liver & Pancreas: Recent Labs  Lab 12/15/22 2340  AST 19  ALT 18  ALKPHOS 126  BILITOT 0.9  PROT 9.3*  ALBUMIN 4.5   Recent Labs  Lab 12/15/22 2340  LIPASE 30   No results for input(s): "AMMONIA" in the last 168 hours. Diabetic: Recent Labs    12/16/22 1657  HGBA1C 9.6*   Recent Labs  Lab 12/16/22 0933 12/16/22 1149 12/16/22 1656 12/16/22 2125 12/17/22 0745  GLUCAP 149* 144* 141* 126* 121*   Cardiac Enzymes: No results for input(s): "CKTOTAL", "CKMB", "CKMBINDEX", "TROPONINI" in the last 168 hours. No results for input(s): "PROBNP" in the last 8760 hours. Coagulation Profile: No results for input(s): "INR", "PROTIME" in the last 168 hours. Thyroid Function Tests: No results for input(s): "TSH", "T4TOTAL", "FREET4", "T3FREE", "THYROIDAB" in the last 72 hours. Lipid Profile: No results for input(s): "CHOL", "HDL", "LDLCALC", "TRIG", "CHOLHDL", "LDLDIRECT" in the last 72 hours. Anemia Panel: No results for input(s): "VITAMINB12", "FOLATE", "FERRITIN", "TIBC", "IRON", "RETICCTPCT" in the last 72 hours. Urine analysis:    Component Value Date/Time   COLORURINE YELLOW 12/16/2022 0324   APPEARANCEUR HAZY (A) 12/16/2022 0324   APPEARANCEUR Clear 06/16/2021 1517   LABSPEC 1.025 12/16/2022 0324   PHURINE 6.0 12/16/2022 0324   GLUCOSEU >=500 (A) 12/16/2022 0324   HGBUR NEGATIVE 12/16/2022 0324   BILIRUBINUR NEGATIVE 12/16/2022 0324   BILIRUBINUR negative 10/14/2021 0942   BILIRUBINUR Negative 06/16/2021 1517   KETONESUR 20 (A) 12/16/2022 0324   PROTEINUR 100 (A) 12/16/2022 0324    UROBILINOGEN 0.2 10/14/2021 0942   UROBILINOGEN 0.2 08/26/2019 1827   NITRITE NEGATIVE 12/16/2022 0324   LEUKOCYTESUR NEGATIVE 12/16/2022 0324   Sepsis Labs: Invalid input(s): "PROCALCITONIN", "LACTICIDVEN"   SIGNED:  Almon Hercules, MD  [facility] 12/17/2022, 7:10 PM

## 2022-12-17 NOTE — Plan of Care (Signed)
  Problem: Education: Goal: Ability to describe self-care measures that may prevent or decrease complications (Diabetes Survival Skills Education) will improve Outcome: Progressing Goal: Individualized Educational Video(s) Outcome: Progressing   Problem: Coping: Goal: Ability to adjust to condition or change in health will improve Outcome: Progressing   Problem: Fluid Volume: Goal: Ability to maintain a balanced intake and output will improve Outcome: Progressing   Problem: Health Behavior/Discharge Planning: Goal: Ability to identify and utilize available resources and services will improve Outcome: Progressing Goal: Ability to manage health-related needs will improve Outcome: Progressing   Problem: Metabolic: Goal: Ability to maintain appropriate glucose levels will improve Outcome: Progressing   Problem: Nutritional: Goal: Maintenance of adequate nutrition will improve Outcome: Progressing Goal: Progress toward achieving an optimal weight will improve Outcome: Progressing   Problem: Skin Integrity: Goal: Risk for impaired skin integrity will decrease Outcome: Progressing   Problem: Tissue Perfusion: Goal: Adequacy of tissue perfusion will improve Outcome: Progressing   Problem: Education: Goal: Ability to describe self-care measures that may prevent or decrease complications (Diabetes Survival Skills Education) will improve Outcome: Progressing Goal: Individualized Educational Video(s) Outcome: Progressing   Problem: Coping: Goal: Ability to adjust to condition or change in health will improve Outcome: Progressing   Problem: Fluid Volume: Goal: Ability to maintain a balanced intake and output will improve Outcome: Progressing   Problem: Health Behavior/Discharge Planning: Goal: Ability to identify and utilize available resources and services will improve Outcome: Progressing Goal: Ability to manage health-related needs will improve Outcome: Progressing    Problem: Metabolic: Goal: Ability to maintain appropriate glucose levels will improve Outcome: Progressing   Problem: Nutritional: Goal: Maintenance of adequate nutrition will improve Outcome: Progressing Goal: Progress toward achieving an optimal weight will improve Outcome: Progressing   Problem: Skin Integrity: Goal: Risk for impaired skin integrity will decrease Outcome: Progressing   Problem: Education: Goal: Knowledge of General Education information will improve Description: Including pain rating scale, medication(s)/side effects and non-pharmacologic comfort measures Outcome: Progressing   Problem: Health Behavior/Discharge Planning: Goal: Ability to manage health-related needs will improve Outcome: Progressing   Problem: Clinical Measurements: Goal: Ability to maintain clinical measurements within normal limits will improve Outcome: Progressing Goal: Will remain free from infection Outcome: Progressing Goal: Diagnostic test results will improve Outcome: Progressing Goal: Respiratory complications will improve Outcome: Progressing Goal: Cardiovascular complication will be avoided Outcome: Progressing   Problem: Activity: Goal: Risk for activity intolerance will decrease Outcome: Progressing   Problem: Nutrition: Goal: Adequate nutrition will be maintained Outcome: Progressing   Problem: Elimination: Goal: Will not experience complications related to bowel motility Outcome: Progressing Goal: Will not experience complications related to urinary retention Outcome: Progressing   Problem: Pain Management: Goal: General experience of comfort will improve Outcome: Progressing   Problem: Education: Goal: Knowledge of General Education information will improve Description: Including pain rating scale, medication(s)/side effects and non-pharmacologic comfort measures Outcome: Progressing   Problem: Health Behavior/Discharge Planning: Goal: Ability to manage  health-related needs will improve Outcome: Progressing

## 2022-12-25 ENCOUNTER — Emergency Department (HOSPITAL_COMMUNITY): Payer: BLUE CROSS/BLUE SHIELD

## 2022-12-25 ENCOUNTER — Encounter (HOSPITAL_COMMUNITY): Payer: Self-pay

## 2022-12-25 ENCOUNTER — Inpatient Hospital Stay (HOSPITAL_COMMUNITY)
Admission: EM | Admit: 2022-12-25 | Discharge: 2022-12-26 | DRG: 638 | Disposition: A | Discharge status: Home / Self Care | Payer: BLUE CROSS/BLUE SHIELD | Attending: Internal Medicine | Admitting: Internal Medicine

## 2022-12-25 ENCOUNTER — Other Ambulatory Visit: Payer: Self-pay

## 2022-12-25 DIAGNOSIS — J45909 Unspecified asthma, uncomplicated: Secondary | ICD-10-CM | POA: Diagnosis present

## 2022-12-25 DIAGNOSIS — Z79899 Other long term (current) drug therapy: Secondary | ICD-10-CM | POA: Diagnosis not present

## 2022-12-25 DIAGNOSIS — E1110 Type 2 diabetes mellitus with ketoacidosis without coma: Secondary | ICD-10-CM | POA: Diagnosis present

## 2022-12-25 DIAGNOSIS — Z91199 Patient's noncompliance with other medical treatment and regimen due to unspecified reason: Secondary | ICD-10-CM | POA: Diagnosis not present

## 2022-12-25 DIAGNOSIS — Z794 Long term (current) use of insulin: Secondary | ICD-10-CM | POA: Diagnosis not present

## 2022-12-25 DIAGNOSIS — Z91013 Allergy to seafood: Secondary | ICD-10-CM | POA: Diagnosis not present

## 2022-12-25 DIAGNOSIS — E875 Hyperkalemia: Secondary | ICD-10-CM | POA: Diagnosis present

## 2022-12-25 DIAGNOSIS — N179 Acute kidney failure, unspecified: Secondary | ICD-10-CM | POA: Diagnosis present

## 2022-12-25 DIAGNOSIS — Z91040 Latex allergy status: Secondary | ICD-10-CM

## 2022-12-25 DIAGNOSIS — Z833 Family history of diabetes mellitus: Secondary | ICD-10-CM | POA: Diagnosis not present

## 2022-12-25 DIAGNOSIS — Z825 Family history of asthma and other chronic lower respiratory diseases: Secondary | ICD-10-CM

## 2022-12-25 DIAGNOSIS — D72829 Elevated white blood cell count, unspecified: Secondary | ICD-10-CM | POA: Diagnosis present

## 2022-12-25 DIAGNOSIS — R000 Tachycardia, unspecified: Secondary | ICD-10-CM | POA: Diagnosis present

## 2022-12-25 DIAGNOSIS — E860 Dehydration: Secondary | ICD-10-CM | POA: Diagnosis present

## 2022-12-25 DIAGNOSIS — F909 Attention-deficit hyperactivity disorder, unspecified type: Secondary | ICD-10-CM | POA: Diagnosis present

## 2022-12-25 DIAGNOSIS — Z8249 Family history of ischemic heart disease and other diseases of the circulatory system: Secondary | ICD-10-CM | POA: Diagnosis not present

## 2022-12-25 DIAGNOSIS — E1010 Type 1 diabetes mellitus with ketoacidosis without coma: Secondary | ICD-10-CM | POA: Diagnosis not present

## 2022-12-25 DIAGNOSIS — Z888 Allergy status to other drugs, medicaments and biological substances status: Secondary | ICD-10-CM

## 2022-12-25 LAB — COMPREHENSIVE METABOLIC PANEL
ALT: 20 U/L (ref 0–44)
AST: 27 U/L (ref 15–41)
Albumin: 4.4 g/dL (ref 3.5–5.0)
Alkaline Phosphatase: 162 U/L — ABNORMAL HIGH (ref 38–126)
BUN: 41 mg/dL — ABNORMAL HIGH (ref 6–20)
CO2: <7 mmol/L — ABNORMAL LOW (ref 22–32)
Calcium: 9.4 mg/dL (ref 8.9–10.3)
Chloride: 99 mmol/L (ref 98–111)
Creatinine, Ser: 1.67 mg/dL — ABNORMAL HIGH (ref 0.44–1.00)
GFR, Estimated: 44 mL/min — ABNORMAL LOW (ref >60)
Glucose, Bld: 708 mg/dL (ref 70–99)
Potassium: 6.2 mmol/L — ABNORMAL HIGH (ref 3.5–5.1)
Sodium: 133 mmol/L — ABNORMAL LOW (ref 135–145)
Total Bilirubin: 1.5 mg/dL — ABNORMAL HIGH (ref <1.2)
Total Protein: 9.1 g/dL — ABNORMAL HIGH (ref 6.5–8.1)

## 2022-12-25 LAB — CBC WITH DIFFERENTIAL/PLATELET
Abs Immature Granulocytes: 0.46 10*3/uL — ABNORMAL HIGH (ref 0.00–0.07)
Basophils Absolute: 0.1 10*3/uL (ref 0.0–0.1)
Basophils Relative: 1 %
Eosinophils Absolute: 0 10*3/uL (ref 0.0–0.5)
Eosinophils Relative: 0 %
HCT: 39.2 % (ref 36.0–46.0)
Hemoglobin: 11.3 g/dL — ABNORMAL LOW (ref 12.0–15.0)
Immature Granulocytes: 2 %
Lymphocytes Relative: 15 %
Lymphs Abs: 3.4 10*3/uL (ref 0.7–4.0)
MCH: 25.7 pg — ABNORMAL LOW (ref 26.0–34.0)
MCHC: 28.8 g/dL — ABNORMAL LOW (ref 30.0–36.0)
MCV: 89.1 fL (ref 80.0–100.0)
Monocytes Absolute: 0.7 10*3/uL (ref 0.1–1.0)
Monocytes Relative: 3 %
Neutro Abs: 18.4 10*3/uL — ABNORMAL HIGH (ref 1.7–7.7)
Neutrophils Relative %: 79 %
Platelets: 526 10*3/uL — ABNORMAL HIGH (ref 150–400)
RBC: 4.4 MIL/uL (ref 3.87–5.11)
RDW: 16.2 % — ABNORMAL HIGH (ref 11.5–15.5)
WBC: 23 10*3/uL — ABNORMAL HIGH (ref 4.0–10.5)
nRBC: 0 % (ref 0.0–0.2)

## 2022-12-25 LAB — BASIC METABOLIC PANEL
BUN: 37 mg/dL — ABNORMAL HIGH (ref 6–20)
BUN: 33 mg/dL — ABNORMAL HIGH (ref 6–20)
CO2: <7 mmol/L — ABNORMAL LOW (ref 22–32)
CO2: <7 mmol/L — ABNORMAL LOW (ref 22–32)
Calcium: 9.6 mg/dL (ref 8.9–10.3)
Calcium: 9.3 mg/dL (ref 8.9–10.3)
Chloride: 113 mmol/L — ABNORMAL HIGH (ref 98–111)
Chloride: 114 mmol/L — ABNORMAL HIGH (ref 98–111)
Creatinine, Ser: 1.28 mg/dL — ABNORMAL HIGH (ref 0.44–1.00)
Creatinine, Ser: 1.27 mg/dL — ABNORMAL HIGH (ref 0.44–1.00)
GFR, Estimated: >60 mL/min (ref >60)
GFR, Estimated: >60 mL/min (ref >60)
Glucose, Bld: 247 mg/dL — ABNORMAL HIGH (ref 70–99)
Glucose, Bld: 249 mg/dL — ABNORMAL HIGH (ref 70–99)
Potassium: 4.6 mmol/L (ref 3.5–5.1)
Potassium: 5.5 mmol/L — ABNORMAL HIGH (ref 3.5–5.1)
Sodium: 142 mmol/L (ref 135–145)
Sodium: 141 mmol/L (ref 135–145)

## 2022-12-25 LAB — I-STAT CHEM 8, ED
BUN: 40 mg/dL — ABNORMAL HIGH (ref 6–20)
Calcium, Ion: 1.24 mmol/L (ref 1.15–1.40)
Chloride: 112 mmol/L — ABNORMAL HIGH (ref 98–111)
Creatinine, Ser: 1.1 mg/dL — ABNORMAL HIGH (ref 0.44–1.00)
Glucose, Bld: 695 mg/dL (ref 70–99)
HCT: 40 % (ref 36.0–46.0)
Hemoglobin: 13.6 g/dL (ref 12.0–15.0)
Potassium: 6.4 mmol/L (ref 3.5–5.1)
Sodium: 136 mmol/L (ref 135–145)
TCO2: 7 mmol/L — ABNORMAL LOW (ref 22–32)

## 2022-12-25 LAB — BLOOD GAS, VENOUS
Acid-base deficit: 25.9 mmol/L — ABNORMAL HIGH (ref 0.0–2.0)
Bicarbonate: 4.2 mmol/L — ABNORMAL LOW (ref 20.0–28.0)
O2 Saturation: 81 %
Patient temperature: 37
pCO2, Ven: 18 mm[Hg] — CL (ref 44–60)
pH, Ven: 6.98 — CL (ref 7.25–7.43)
pO2, Ven: 57 mm[Hg] — ABNORMAL HIGH (ref 32–45)

## 2022-12-25 LAB — RESP PANEL BY RT-PCR (RSV, FLU A&B, COVID)  RVPGX2
Influenza A by PCR: NEGATIVE
Influenza B by PCR: NEGATIVE
Resp Syncytial Virus by PCR: NEGATIVE
SARS Coronavirus 2 by RT PCR: NEGATIVE

## 2022-12-25 LAB — GLUCOSE, CAPILLARY
Glucose-Capillary: 495 mg/dL — ABNORMAL HIGH (ref 70–99)
Glucose-Capillary: 397 mg/dL — ABNORMAL HIGH (ref 70–99)
Glucose-Capillary: 455 mg/dL — ABNORMAL HIGH (ref 70–99)
Glucose-Capillary: 334 mg/dL — ABNORMAL HIGH (ref 70–99)
Glucose-Capillary: 230 mg/dL — ABNORMAL HIGH (ref 70–99)
Glucose-Capillary: 191 mg/dL — ABNORMAL HIGH (ref 70–99)
Glucose-Capillary: 204 mg/dL — ABNORMAL HIGH (ref 70–99)
Glucose-Capillary: 219 mg/dL — ABNORMAL HIGH (ref 70–99)
Glucose-Capillary: 226 mg/dL — ABNORMAL HIGH (ref 70–99)
Glucose-Capillary: 231 mg/dL — ABNORMAL HIGH (ref 70–99)
Glucose-Capillary: 202 mg/dL — ABNORMAL HIGH (ref 70–99)
Glucose-Capillary: 221 mg/dL — ABNORMAL HIGH (ref 70–99)
Glucose-Capillary: 194 mg/dL — ABNORMAL HIGH (ref 70–99)

## 2022-12-25 LAB — CBG MONITORING, ED
Glucose-Capillary: 572 mg/dL (ref 70–99)
Glucose-Capillary: 581 mg/dL (ref 70–99)

## 2022-12-25 LAB — BETA-HYDROXYBUTYRIC ACID: Beta-Hydroxybutyric Acid: >4.5 mmol/L — ABNORMAL HIGH (ref 0.05–0.27)

## 2022-12-25 LAB — HCG, SERUM, QUALITATIVE: Preg, Serum: NEGATIVE

## 2022-12-25 MED ORDER — DEXTROSE 50 % IV SOLN: 0.0000 mL | INTRAVENOUS | Status: DC | PRN

## 2022-12-25 MED ORDER — INSULIN REGULAR(HUMAN) IN NACL 100-0.9 UT/100ML-% IV SOLN
INTRAVENOUS | Status: DC
  Administered 2022-12-25: 7.5 [IU]/h via INTRAVENOUS
  Filled 2022-12-25: qty 100

## 2022-12-25 MED ORDER — ORAL CARE MOUTH RINSE: 15.0000 mL | OROMUCOSAL | Status: DC | PRN

## 2022-12-25 MED ORDER — MORPHINE SULFATE (PF) 2 MG/ML IV SOLN
1.0000 mg | INTRAVENOUS | Status: DC | PRN
  Administered 2022-12-25 (×2): 1 mg via INTRAVENOUS
  Filled 2022-12-25 (×2): qty 1

## 2022-12-25 MED ORDER — ALBUTEROL SULFATE (2.5 MG/3ML) 0.083% IN NEBU: 2.5000 mg | INHALATION_SOLUTION | RESPIRATORY_TRACT | Status: DC | PRN

## 2022-12-25 MED ORDER — OXYCODONE HCL 5 MG PO TABS
5.0000 mg | ORAL_TABLET | ORAL | Status: DC | PRN
  Administered 2022-12-25: 5 mg via ORAL
  Filled 2022-12-25: qty 1

## 2022-12-25 MED ORDER — LACTATED RINGERS IV SOLN: INTRAVENOUS | Status: AC

## 2022-12-25 MED ORDER — CALCIUM GLUCONATE-NACL 1-0.675 GM/50ML-% IV SOLN
1.0000 g | Freq: Once | INTRAVENOUS | Status: AC
  Administered 2022-12-25: 1000 mg via INTRAVENOUS
  Filled 2022-12-25: qty 50

## 2022-12-25 MED ORDER — ONDANSETRON HCL 4 MG/2ML IJ SOLN
4.0000 mg | Freq: Once | INTRAMUSCULAR | Status: AC
  Administered 2022-12-25: 4 mg via INTRAVENOUS
  Filled 2022-12-25: qty 2

## 2022-12-25 MED ORDER — ACETAMINOPHEN 650 MG RE SUPP: 650.0000 mg | Freq: Four times a day (QID) | RECTAL | Status: DC | PRN

## 2022-12-25 MED ORDER — ONDANSETRON HCL 4 MG/2ML IJ SOLN
4.0000 mg | Freq: Four times a day (QID) | INTRAMUSCULAR | Status: DC | PRN
  Administered 2022-12-25: 4 mg via INTRAVENOUS
  Filled 2022-12-25: qty 2

## 2022-12-25 MED ORDER — ESCITALOPRAM OXALATE 20 MG PO TABS
20.0000 mg | ORAL_TABLET | Freq: Every day | ORAL | Status: DC
  Administered 2022-12-26: 20 mg via ORAL
  Filled 2022-12-25: qty 1

## 2022-12-25 MED ORDER — LACTATED RINGERS IV BOLUS
1000.0000 mL | Freq: Once | INTRAVENOUS | Status: AC
  Administered 2022-12-25: 1000 mL via INTRAVENOUS

## 2022-12-25 MED ORDER — FERROUS SULFATE 325 (65 FE) MG PO TABS
324.0000 mg | ORAL_TABLET | ORAL | Status: DC
  Administered 2022-12-26: 324 mg via ORAL
  Filled 2022-12-25: qty 1

## 2022-12-25 MED ORDER — ENOXAPARIN SODIUM 40 MG/0.4ML IJ SOSY
40.0000 mg | PREFILLED_SYRINGE | INTRAMUSCULAR | Status: DC
  Filled 2022-12-25: qty 0.4

## 2022-12-25 MED ORDER — ACETAMINOPHEN 325 MG PO TABS: 650.0000 mg | ORAL_TABLET | Freq: Four times a day (QID) | ORAL | Status: DC | PRN

## 2022-12-25 MED ORDER — TRAZODONE HCL 50 MG PO TABS
25.0000 mg | ORAL_TABLET | Freq: Every evening | ORAL | Status: DC | PRN
Start: 2022-12-25 — End: 2022-12-26

## 2022-12-25 MED ORDER — DEXTROSE IN LACTATED RINGERS 5 % IV SOLN: INTRAVENOUS | Status: AC

## 2022-12-25 MED ORDER — SODIUM CHLORIDE 0.45 % IV SOLN
Freq: Once | INTRAVENOUS | Status: AC
  Filled 2022-12-25: qty 75

## 2022-12-25 MED ORDER — ONDANSETRON HCL 4 MG PO TABS: 4.0000 mg | ORAL_TABLET | Freq: Four times a day (QID) | ORAL | Status: DC | PRN

## 2022-12-25 NOTE — ED Triage Notes (Signed)
PT arrives via POV. PT reports nausea, vomiting, and body aches since last night. PT states her sugar was reading high. BG during triage is 572. Pt was recently seen and treated for DKA. Pt is AxOx4.
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
## 2022-12-25 NOTE — Plan of Care (Signed)
  Problem: Education: Goal: Knowledge of General Education information will improve Description: Including pain rating scale, medication(s)/side effects and non-pharmacologic comfort measures Outcome: Progressing   Problem: Coping: Goal: Level of anxiety will decrease Outcome: Progressing   Problem: Activity: Goal: Risk for activity intolerance will decrease Outcome: Not Progressing   Problem: Nutrition: Goal: Adequate nutrition will be maintained Outcome: Not Progressing

## 2022-12-25 NOTE — ED Provider Notes (Signed)
[place] EMERGENCY DEPARTMENT AT [facility] Provider Note   CSN: [id number] Arrival date & time: 12/25/22  4401     History  Chief Complaint  Patient presents with   Hyperglycemia   Nausea   Emesis    Dawn [name] is a 22 y.o. female.  HPI 22 year old female with a history of type 1 diabetes presents with high blood sugar.  States that her glucose has been over 400 since last night.  She has been dealing with a respiratory illness with cough and rhinorrhea and bodyaches over the past couple days.  She states she lost her insulin pump though has been taking subcutaneous insulin, as recently as this morning.  She does not think she has had a fever.  She had vomiting last night but no vomiting currently.  Home Medications Prior to Admission medications   Medication Sig Start Date End Date Taking? Authorizing Provider  Continuous Glucose Sensor (DEXCOM G7 SENSOR) MISC Inject 1 Application into the skin as directed. 10/30/22   Ivery Quale, MD  escitalopram (LEXAPRO) 20 MG tablet Take 20 mg by mouth daily as needed. 10/04/22 01/02/23  [provider]  ferrous sulfate 324 MG TBEC Take 1 tablet (324 mg total) by mouth every other day. 11/02/22   Fortunato Curling, DO  Glucagon (BAQSIMI TWO PACK) 3 MG/DOSE POWD Place 3 mg into the nose as needed (low blood sugar).    [provider]  ibuprofen (ADVIL) 800 MG tablet Take 800 mg by mouth every 6 (six) hours as needed. 12/11/22   [provider]  Insulin Aspart FlexPen (NOVOLOG) 100 UNIT/ML Inject 0-50 Units into the skin See admin instructions. INJECT 1 UNIT FOR EVERY 25G OF CARBS. PLUS 1 UNIT FOR EVERY 50 ABOVE 150 MG/DL PREMEALS. MAX DOSAGE 50U/DAY    [provider]  Insulin Disposable Pump (OMNIPOD DASH PODS, GEN 4,) MISC SMARTSIG:SUB-Q Every 3 Days 11/20/22   [provider]  Insulin Human (INSULIN PUMP) SOLN Inject 1 each into the skin in the morning, at noon, in the evening, and at  bedtime.    [provider]  insulin lispro (HUMALOG) 100 UNIT/ML KwikPen Inject 3 Units into the skin 3 (three) times daily with meals. If eating and Blood Glucose (BG) 80 or higher inject 3 units for meal coverage and add correction dose per scale. If not eating, correction dose only. BG <150= 0 unit; BG 150-200= 1 unit; BG 201-250= 2 unit; BG 251-300= 3 unit; BG 301-350= 4 unit; BG 351-400= 5 unit; BG >400= 6 unit and Call Primary Care. 07/16/22   Noralee Stain, DO  medroxyPROGESTERone (DEPO-PROVERA) 150 MG/ML injection Inject 150 mg into the muscle every 3 (three) months. Patient not taking: Reported on 12/16/2022 12/11/22   [provider]  norgestimate-ethinyl estradiol (SPRINTEC 28) 0.25-35 MG-MCG tablet Take 1 tablet by mouth daily. Patient not taking: Reported on 12/16/2022 12/12/22   Darral Dash, DO  SUMAtriptan (IMITREX) 25 MG tablet Take 0.5 tablets (12.5 mg total) by mouth every 2 (two) hours as needed for migraine. May repeat in 2 hours if headache persists or recurs. 04/12/21   Dana Allan, MD  [name] FLEXTOUCH 100 UNIT/ML FlexTouch Pen Inject 30 Units into the skin daily.    [provider]  Vitamin D, Ergocalciferol, (DRISDOL) 1.25 MG (50000 UNIT) CAPS capsule Take 50,000 Units by mouth every 7 (seven) days. 05/24/22   [provider]      Allergies    Latex, Tape, and Shellfish allergy  Review of Systems   Review of Systems  Constitutional:  Negative for fever.  HENT:  Positive for rhinorrhea.   Respiratory:  Positive for cough.   Gastrointestinal:  Positive for nausea and vomiting. Negative for abdominal pain.  Musculoskeletal:  Positive for myalgias.    Physical Exam Updated Vital Signs BP (!) 156/98   Pulse (!) 124   Temp 97.9 F (36.6 C)   Resp 20   Ht 5' (1.524 m)   Wt 53.5 kg   LMP 11/15/2022 (Approximate)   SpO2 95%   BMI 23.05 kg/m  Physical Exam Vitals and nursing note reviewed.  Constitutional:      Appearance:  She is well-developed. She is not diaphoretic.  HENT:     Head: Normocephalic and atraumatic.  Cardiovascular:     Rate and Rhythm: Regular rhythm. Tachycardia present.     Heart sounds: Normal heart sounds.  Pulmonary:     Effort: Pulmonary effort is normal.     Breath sounds: Normal breath sounds. No wheezing or rales.  Abdominal:     General: There is no distension.     Palpations: Abdomen is soft.     Tenderness: There is no abdominal tenderness.  Skin:    General: Skin is warm and dry.  Neurological:     Mental Status: She is alert.    ED Results / Procedures / Treatments   Labs (all labs ordered are listed, but only abnormal results are displayed) Labs Reviewed  COMPREHENSIVE METABOLIC PANEL - Abnormal; Notable for the following components:      Result Value   Sodium 133 (*)    Potassium 6.2 (*)    CO2 <7 (*)    Glucose, Bld 708 (*)    BUN 41 (*)    Creatinine, Ser 1.67 (*)    Total Protein 9.1 (*)    Alkaline Phosphatase 162 (*)    Total Bilirubin 1.5 (*)    GFR, Estimated 44 (*)    All other components within normal limits  CBC WITH DIFFERENTIAL/PLATELET - Abnormal; Notable for the following components:   WBC 23.0 (*)    Hemoglobin 11.3 (*)    MCH 25.7 (*)    MCHC 28.8 (*)    RDW 16.2 (*)    Platelets 526 (*)    Neutro Abs 18.4 (*)    Abs Immature Granulocytes 0.46 (*)    All other components within normal limits  BLOOD GAS, VENOUS - Abnormal; Notable for the following components:   pH, Ven 6.98 (*)    pCO2, Ven 18 (*)    pO2, Ven 57 (*)    Bicarbonate 4.2 (*)    Acid-base deficit 25.9 (*)    All other components within normal limits  CBG MONITORING, ED - Abnormal; Notable for the following components:   Glucose-Capillary 572 (*)    All other components within normal limits  I-STAT CHEM 8, ED - Abnormal; Notable for the following components:   Potassium 6.4 (*)    Chloride 112 (*)    BUN 40 (*)    Creatinine, Ser 1.10 (*)    Glucose, Bld 695 (*)     TCO2 7 (*)    All other components within normal limits  RESP PANEL BY RT-PCR (RSV, FLU A&B, COVID)  RVPGX2  HCG, SERUM, QUALITATIVE  URINALYSIS, W/ REFLEX TO CULTURE (INFECTION SUSPECTED)  BETA-HYDROXYBUTYRIC ACID  BASIC METABOLIC PANEL  BASIC METABOLIC PANEL  BASIC METABOLIC PANEL  CBG MONITORING, ED    EKG EKG  Interpretation Date/Time:  Sunday December 25 2022 10:14:46 EST Ventricular Rate:  120 PR Interval:  107 QRS Duration:  79 QT Interval:  312 QTC Calculation: 441 R Axis:   73  Text Interpretation: Sinus tachycardia Consider right atrial enlargement Minimal ST depression, inferior leads Peaked T waves new since Aug 2024 Confirmed by Pricilla Loveless [phone]) on 12/25/2022 11:05:00 AM  Radiology DG Chest Portable 1 View Result Date: 12/25/2022 CLINICAL DATA:  Cough, nausea, vomiting and body aches since last evening. EXAM: PORTABLE CHEST 1 VIEW COMPARISON:  12/16/2022 FINDINGS: The cardiac silhouette, mediastinal and hilar contours are normal. The lungs are clear. The bony thorax is intact. IMPRESSION: No acute cardiopulmonary findings. Electronically Signed   By: Rudie Meyer M.D.   On: 12/25/2022 10:07    Procedures .Critical Care  Performed by: Pricilla Loveless, MD Authorized by: Pricilla Loveless, MD   Critical care provider statement:    Critical care time (minutes):  35   Critical care time was exclusive of:  Separately billable procedures and treating other patients   Critical care was necessary to treat or prevent imminent or life-threatening deterioration of the following conditions:  Endocrine crisis and metabolic crisis   Critical care was time spent personally by me on the following activities:  Development of treatment plan with patient or surrogate, discussions with consultants, evaluation of patient's response to treatment, examination of patient, ordering and review of laboratory studies, ordering and review of radiographic studies, ordering and performing  treatments and interventions, pulse oximetry, re-evaluation of patient's condition and review of old charts     Medications Ordered in ED Medications  insulin regular, human (MYXREDLIN) 100 units/ 100 mL infusion (7.5 Units/hr Intravenous New Bag/Given 12/25/22 1035)  lactated ringers infusion ( Intravenous New Bag/Given 12/25/22 1037)  dextrose 5 % in lactated ringers infusion (has no administration in time range)  dextrose 50 % solution 0-50 mL (has no administration in time range)  calcium gluconate 1 g/ 50 mL sodium chloride IVPB (has no administration in time range)  escitalopram (LEXAPRO) tablet 20 mg (has no administration in time range)  ferrous sulfate EC tablet 324 mg (has no administration in time range)  enoxaparin (LOVENOX) injection 40 mg (has no administration in time range)  acetaminophen (TYLENOL) tablet 650 mg (has no administration in time range)    Or  acetaminophen (TYLENOL) suppository 650 mg (has no administration in time range)  oxyCODONE (Oxy IR/ROXICODONE) immediate release tablet 5 mg (has no administration in time range)  morphine (PF) 2 MG/ML injection 1 mg (has no administration in time range)  ondansetron (ZOFRAN) tablet 4 mg (has no administration in time range)    Or  ondansetron (ZOFRAN) injection 4 mg (has no administration in time range)  traZODone (DESYREL) tablet 25 mg (has no administration in time range)  albuterol (PROVENTIL) (2.5 MG/3ML) 0.083% nebulizer solution 2.5 mg (has no administration in time range)  lactated ringers bolus 1,000 mL (0 mLs Intravenous Stopped 12/25/22 1037)  ondansetron (ZOFRAN) injection 4 mg (4 mg Intravenous Given 12/25/22 6213)    ED Course/ Medical Decision Making/ A&P                                 Medical Decision Making Amount and/or Complexity of Data Reviewed External Data Reviewed: notes. Labs: ordered.    Details: AKI, hyperkalemia, and metabolic acidosis associated with DKA. Radiology: ordered and  independent interpretation performed.    Details:  No pneumonia. ECG/medicine tests: ordered and independent interpretation performed.    Details: Tachycardia, peaked T waves  Risk Prescription drug management. Decision regarding hospitalization.   Patient presents with DKA.  She was started on IV fluids and found to have AKI, hyperkalemia, peaked T waves.  Started on insulin drip which should bring down her potassium.  She is not altered.  She is not hypotensive.  Discussed with Dr. Vassie Loll, while her pH is low at 6.98, given she is not encephalopathic could be a hospitalist admit in the stepdown unit.  Discussed with Dr. Kirby Crigler, who will admit.        Final Clinical Impression(s) / ED Diagnoses Final diagnoses:  Diabetic ketoacidosis without coma associated with type 1 diabetes mellitus (HCC)  Hyperkalemia  Acute kidney injury [facility])    Rx / DC Orders ED Discharge Orders     None         Pricilla Loveless, MD 12/25/22 1517
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
## 2022-12-26 DIAGNOSIS — N179 Acute kidney failure, unspecified: Secondary | ICD-10-CM

## 2022-12-26 DIAGNOSIS — E1010 Type 1 diabetes mellitus with ketoacidosis without coma: Secondary | ICD-10-CM | POA: Diagnosis not present

## 2022-12-26 LAB — RESPIRATORY PANEL BY PCR

## 2022-12-26 LAB — URINALYSIS, W/ REFLEX TO CULTURE (INFECTION SUSPECTED)
Bilirubin Urine: NEGATIVE
Glucose, UA: NEGATIVE mg/dL
Ketones, ur: 20 mg/dL — AB
Nitrite: NEGATIVE
Protein, ur: NEGATIVE mg/dL
Specific Gravity, Urine: 1.011 (ref 1.005–1.030)
pH: 5 (ref 5.0–8.0)

## 2022-12-26 LAB — BASIC METABOLIC PANEL
Anion gap: 13 (ref 5–15)
Anion gap: 14 (ref 5–15)
Anion gap: 10 (ref 5–15)
Anion gap: 10 (ref 5–15)
BUN: 25 mg/dL — ABNORMAL HIGH (ref 6–20)
BUN: 20 mg/dL (ref 6–20)
BUN: 18 mg/dL (ref 6–20)
BUN: 15 mg/dL (ref 6–20)
CO2: 13 mmol/L — ABNORMAL LOW (ref 22–32)
CO2: 13 mmol/L — ABNORMAL LOW (ref 22–32)
CO2: 18 mmol/L — ABNORMAL LOW (ref 22–32)
CO2: 18 mmol/L — ABNORMAL LOW (ref 22–32)
Calcium: 8.5 mg/dL — ABNORMAL LOW (ref 8.9–10.3)
Calcium: 8.5 mg/dL — ABNORMAL LOW (ref 8.9–10.3)
Calcium: 8.6 mg/dL — ABNORMAL LOW (ref 8.9–10.3)
Calcium: 8.5 mg/dL — ABNORMAL LOW (ref 8.9–10.3)
Chloride: 110 mmol/L (ref 98–111)
Chloride: 112 mmol/L — ABNORMAL HIGH (ref 98–111)
Chloride: 109 mmol/L (ref 98–111)
Chloride: 109 mmol/L (ref 98–111)
Creatinine, Ser: 0.99 mg/dL (ref 0.44–1.00)
Creatinine, Ser: 0.62 mg/dL (ref 0.44–1.00)
Creatinine, Ser: 0.65 mg/dL (ref 0.44–1.00)
Creatinine, Ser: 0.53 mg/dL (ref 0.44–1.00)
GFR, Estimated: >60 mL/min (ref >60)
GFR, Estimated: >60 mL/min (ref >60)
GFR, Estimated: >60 mL/min (ref >60)
GFR, Estimated: >60 mL/min (ref >60)
Glucose, Bld: 240 mg/dL — ABNORMAL HIGH (ref 70–99)
Glucose, Bld: 191 mg/dL — ABNORMAL HIGH (ref 70–99)
Glucose, Bld: 177 mg/dL — ABNORMAL HIGH (ref 70–99)
Glucose, Bld: 163 mg/dL — ABNORMAL HIGH (ref 70–99)
Potassium: 3.8 mmol/L (ref 3.5–5.1)
Potassium: 3.9 mmol/L (ref 3.5–5.1)
Potassium: 3.2 mmol/L — ABNORMAL LOW (ref 3.5–5.1)
Potassium: 3.1 mmol/L — ABNORMAL LOW (ref 3.5–5.1)
Sodium: 136 mmol/L (ref 135–145)
Sodium: 139 mmol/L (ref 135–145)
Sodium: 137 mmol/L (ref 135–145)
Sodium: 137 mmol/L (ref 135–145)

## 2022-12-26 LAB — GLUCOSE, CAPILLARY
Glucose-Capillary: 136 mg/dL — ABNORMAL HIGH (ref 70–99)
Glucose-Capillary: 149 mg/dL — ABNORMAL HIGH (ref 70–99)
Glucose-Capillary: 154 mg/dL — ABNORMAL HIGH (ref 70–99)
Glucose-Capillary: 159 mg/dL — ABNORMAL HIGH (ref 70–99)
Glucose-Capillary: 160 mg/dL — ABNORMAL HIGH (ref 70–99)
Glucose-Capillary: 160 mg/dL — ABNORMAL HIGH (ref 70–99)
Glucose-Capillary: 164 mg/dL — ABNORMAL HIGH (ref 70–99)
Glucose-Capillary: 169 mg/dL — ABNORMAL HIGH (ref 70–99)
Glucose-Capillary: 170 mg/dL — ABNORMAL HIGH (ref 70–99)
Glucose-Capillary: 177 mg/dL — ABNORMAL HIGH (ref 70–99)
Glucose-Capillary: 178 mg/dL — ABNORMAL HIGH (ref 70–99)
Glucose-Capillary: 194 mg/dL — ABNORMAL HIGH (ref 70–99)
Glucose-Capillary: 199 mg/dL — ABNORMAL HIGH (ref 70–99)

## 2022-12-26 LAB — CBC
HCT: 28.1 % — ABNORMAL LOW (ref 36.0–46.0)
Hemoglobin: 8.8 g/dL — ABNORMAL LOW (ref 12.0–15.0)
MCH: 25.6 pg — ABNORMAL LOW (ref 26.0–34.0)
MCHC: 31.3 g/dL (ref 30.0–36.0)
MCV: 81.7 fL (ref 80.0–100.0)
Platelets: 411 10*3/uL — ABNORMAL HIGH (ref 150–400)
RBC: 3.44 MIL/uL — ABNORMAL LOW (ref 3.87–5.11)
RDW: 48.3 % — ABNORMAL HIGH (ref 11.5–15.5)
WBC: 24 10*3/uL — ABNORMAL HIGH (ref 4.0–10.5)
nRBC: 0 % (ref 0.0–0.2)

## 2022-12-26 LAB — BETA-HYDROXYBUTYRIC ACID
Beta-Hydroxybutyric Acid: 4.02 mmol/L — ABNORMAL HIGH (ref 0.05–0.27)
Beta-Hydroxybutyric Acid: 2.52 mmol/L — ABNORMAL HIGH (ref 0.05–0.27)

## 2022-12-26 LAB — HIV ANTIBODY (ROUTINE TESTING W REFLEX): HIV Screen 4th Generation wRfx: NONREACTIVE

## 2022-12-26 MED ORDER — INSULIN ASPART FLEXPEN 100 UNIT/ML ~~LOC~~ SOPN: 0.0000 [IU] | PEN_INJECTOR | SUBCUTANEOUS | Status: DC

## 2022-12-26 MED ORDER — INSULIN ASPART 100 UNIT/ML IJ SOLN
0.0000 [IU] | Freq: Three times a day (TID) | INTRAMUSCULAR | Status: DC
  Administered 2022-12-26: 2 [IU] via SUBCUTANEOUS

## 2022-12-26 MED ORDER — POTASSIUM CHLORIDE CRYS ER 20 MEQ PO TBCR
40.0000 meq | EXTENDED_RELEASE_TABLET | ORAL | Status: AC
  Administered 2022-12-26 (×2): 40 meq via ORAL
  Filled 2022-12-26 (×2): qty 2

## 2022-12-26 MED ORDER — INSULIN GLARGINE-YFGN 100 UNIT/ML ~~LOC~~ SOLN
30.0000 [IU] | Freq: Every day | SUBCUTANEOUS | Status: DC
Start: 2022-12-26 — End: 2022-12-26
  Administered 2022-12-26: 30 [IU] via SUBCUTANEOUS
  Filled 2022-12-26: qty 0.3

## 2022-12-26 MED ORDER — INSULIN ASPART 100 UNIT/ML IJ SOLN: 0.0000 [IU] | Freq: Every day | INTRAMUSCULAR | Status: DC

## 2022-12-26 MED ORDER — INSULIN DEGLUDEC 100 UNIT/ML ~~LOC~~ SOPN: 30.0000 [IU] | PEN_INJECTOR | Freq: Every day | SUBCUTANEOUS | Status: DC

## 2022-12-26 MED ORDER — PHENOL 1.4 % MT LIQD
1.0000 | OROMUCOSAL | Status: DC | PRN
  Administered 2022-12-26: 1 via OROMUCOSAL
  Filled 2022-12-26: qty 177

## 2022-12-26 MED ORDER — CHLORHEXIDINE GLUCONATE CLOTH 2 % EX PADS
6.0000 | MEDICATED_PAD | Freq: Every day | CUTANEOUS | Status: DC
  Administered 2022-12-26: 6 via TOPICAL

## 2022-12-26 MED ORDER — INSULIN ASPART 100 UNIT/ML IJ SOLN: 3.0000 [IU] | Freq: Three times a day (TID) | INTRAMUSCULAR | Status: DC

## 2022-12-26 NOTE — Discharge Instructions (Signed)
Back up plan off insulin pump: [name] 30 units and increased by 2 units every 3 days until fasting BG is 200 mg/dL or less Humalog ICR 1 unit for 20 g carbs  Humalog correctional scale - 1 unit for every 50 above 150 mg/dL

## 2022-12-26 NOTE — Plan of Care (Signed)

## 2022-12-26 NOTE — Plan of Care (Signed)
  Problem: Clinical Measurements: Goal: Diagnostic test results will improve Outcome: Progressing   Problem: Activity: Goal: Risk for activity intolerance will decrease Outcome: Progressing   Problem: Elimination: Goal: Will not experience complications related to bowel motility Outcome: Progressing   

## 2022-12-26 NOTE — Discharge Summary (Signed)
Physician Discharge Summary   Patient: Dawn [name] MRN: [id number] DOB: 03/17/2000  Admit date:     12/25/2022  Discharge date: 12/26/22  Discharge Physician: Rickey Barbara   PCP: Darral Dash, DO   Recommendations at discharge:    Follow up with PCP in 1-2 weeks Follow up with Endocrinologist   Discharge Diagnoses: Active Problems:   DKA (diabetic ketoacidosis) (HCC)  Resolved Problems:   * No resolved hospital problems. *  Hospital Course: 22 y.o. female with medical history significant for type 1 diabetes on insulin drip, ADHD, asthma admitted to the hospital with vomiting, hyperglycemia and severe diabetic ketoacidosis.  She was recently discharged from this hospital just 1 week ago, after a short stay for DKA.  States that for the last couple of days, she has had a cough, some upper respiratory symptoms.  She noticed for the last couple days that her blood sugar was starting to rise, in the 300s.  Today, she started vomiting, noticed that her blood sugar was in the 500s.  In the emergency department, she has been afebrile but tachycardic and tachypneic.  She has acute kidney injury, hyperkalemia and severe metabolic acidosis.  She was given 1 L LR bolus, and started on IV insulin drip.  ER provider discussed with critical care and due to her significant acidosis, they will see the patient in consultation but recommended that patient could be managed by hospitalist service.   Assessment and Plan: DKA-patient states that she has been off of her insulin pump however has been taking basal bolus insulin.  Developed hyperglycemia likely as a result of an upper respiratory infection over the last couple of days. -Given aggressive IV fluid resuscitation per DKA protocol -Pt was continued on insulin drip per DKA protocol -DKA physiology resolved and pt was transitioned to subcutaneous insulin -per Diabetic coordinator, pt to continue with already-established backup plan off insulin pump,  see medication below, and to follow up with Endocrinologist   Hyperkalemia -corrected   Acute kidney injury-likely due to effective dehydration from vomiting and severe hyperglycemia.  -improved with hydration   Leukocytosis-likely reactive in the setting of DKA and recent vomiting   Asthma-patient is stable on room air, with no evidence of asthma exacerbation.  She is currently tachypneic but this is likely due to her metabolic acidosis. -Albuterol as needed for shortness of breath or cough    Consultants:  Procedures performed:   Disposition: Home Diet recommendation:  Carb modified diet DISCHARGE MEDICATION: Allergies as of 12/26/2022       Reactions   Latex Itching, Swelling, Other (See Comments)   Skin irritation   Tape Other (See Comments)   Skin irritation   Shellfish Allergy Itching, Rash   Scallops specifically        Medication List     STOP taking these medications    insulin lispro 100 UNIT/ML KwikPen Commonly known as: HUMALOG   insulin pump Soln   Omnipod DASH Pods (Gen 4) Misc       TAKE these medications    Baqsimi Two Pack 3 MG/DOSE Powd Generic drug: Glucagon Place 3 mg into the nose as needed (low blood sugar).   Dexcom G7 Sensor Misc Inject 1 Application into the skin as directed.   escitalopram 20 MG tablet Commonly known as: LEXAPRO Take 20 mg by mouth daily as needed.   ferrous sulfate 324 MG Tbec Take 1 tablet (324 mg total) by mouth every other day.   ibuprofen 800 MG tablet Commonly known  as: ADVIL Take 800 mg by mouth every 6 (six) hours as needed.   Insulin Aspart FlexPen 100 UNIT/ML Commonly known as: NOVOLOG Inject 0-50 Units into the skin See admin instructions. INJECT 1 UNIT FOR EVERY 20G OF CARBS. PLUS 1 UNIT FOR EVERY 50 ABOVE 150 MG/DL PREMEALS. MAX DOSAGE 50U/DAY What changed: additional instructions   medroxyPROGESTERone 150 MG/ML injection Commonly known as: DEPO-PROVERA Inject 150 mg into the muscle  every 3 (three) months.   norgestimate-ethinyl estradiol 0.25-35 MG-MCG tablet Commonly known as: Sprintec 28 Take 1 tablet by mouth daily.   SUMAtriptan 25 MG tablet Commonly known as: Imitrex Take 0.5 tablets (12.5 mg total) by mouth every 2 (two) hours as needed for migraine. May repeat in 2 hours if headache persists or recurs.   [name] FlexTouch 100 UNIT/ML FlexTouch Pen Generic drug: insulin degludec Inject 30 Units into the skin daily. What changed: Another medication with the same name was added. Make sure you understand how and when to take each.   insulin degludec 100 UNIT/ML FlexTouch Pen Commonly known as: [name] Inject 30 Units into the skin daily. Per your Endocrinologist, increase by 2 units every 3 days until fasting BG is 200mg /dL or less What changed: You were already taking a medication with the same name, and this prescription was added. Make sure you understand how and when to take each.   Vitamin D (Ergocalciferol) 1.25 MG (50000 UNIT) Caps capsule Commonly known as: DRISDOL Take 50,000 Units by mouth every 7 (seven) days.        Follow-up Information     Dameron, Nolberto Hanlon, DO Follow up in 2 week(s).   Specialty: Family Medicine Why: Hospital follow up Contact information: [address] [place] [address] [phone]         Nile Dear, DO. Schedule an appointment as soon as possible for a visit.   Specialty: Endocrinology Why: Hospital follow up Contact information: [address] [place] [address] [phone]                Discharge Exam: Ceasar Mons Weights   12/25/22 0827 12/25/22 1155  Weight: 53.5 kg 48.2 kg   General exam: Awake, laying in bed, in nad Respiratory system: Normal respiratory effort, no wheezing Cardiovascular system: regular rate, s1, s2 Gastrointestinal system: Soft, nondistended, positive BS Central nervous system: CN2-12 grossly intact, strength intact Extremities: Perfused, no  clubbing Skin: Normal skin turgor, no notable skin lesions seen Psychiatry: Mood normal // no visual hallucinations   Condition at discharge: fair  The results of significant diagnostics from this hospitalization (including imaging, microbiology, ancillary and laboratory) are listed below for reference.   Imaging Studies: DG Chest Portable 1 View Result Date: 12/25/2022 CLINICAL DATA:  Cough, nausea, vomiting and body aches since last evening. EXAM: PORTABLE CHEST 1 VIEW COMPARISON:  12/16/2022 FINDINGS: The cardiac silhouette, mediastinal and hilar contours are normal. The lungs are clear. The bony thorax is intact. IMPRESSION: No acute cardiopulmonary findings. Electronically Signed   By: Rudie Meyer M.D.   On: 12/25/2022 10:07   US PELVIS (TRANSABDOMINAL ONLY) Result Date: 12/16/2022 CLINICAL DATA:  Several month history of pelvic pain EXAM: TRANSABDOMINAL ULTRASOUND OF PELVIS TECHNIQUE: Transabdominal ultrasound examination of the pelvis was performed including evaluation of the uterus, ovaries, adnexal regions, and pelvic cul-de-sac. COMPARISON:  None Available. FINDINGS: Uterus Measurements: 6.9 cm in sagittal dimension. No fibroids or other mass visualized. Endometrium Thickness: 7 mm.  No focal abnormality visualized. Right ovary Not seen. Left  ovary Not seen. Other findings:  No abnormal free fluid. IMPRESSION: 1. Technically challenging examination due to bowel gas. 2. Normal-appearing uterus and endometrium. 3. Nonvisualization of the ovaries. Electronically Signed   By: Agustin Cree M.D.   On: 12/16/2022 16:20   DG Chest Portable 1 View Result Date: 12/16/2022 CLINICAL DATA:  Hyperglycemia EXAM: PORTABLE CHEST 1 VIEW COMPARISON:  10/01/2022 FINDINGS: Lungs are clear.  No pleural effusion or pneumothorax. The heart is normal in size. IMPRESSION: No acute cardiopulmonary disease. Electronically Signed   By: Charline Bills M.D.   On: 12/16/2022 00:15    Microbiology: Results for  orders placed or performed during the hospital encounter of 12/25/22  Resp panel by RT-PCR (RSV, Flu A&B, Covid) Anterior Nasal Swab     Status: None   Collection Time: 12/25/22  9:47 AM   Specimen: Anterior Nasal Swab  Result Value Ref Range Status   SARS Coronavirus 2 by RT PCR NEGATIVE NEGATIVE Final    Comment: (NOTE) SARS-CoV-2 target nucleic acids are NOT DETECTED.  The SARS-CoV-2 RNA is generally detectable in upper respiratory specimens during the acute phase of infection. The lowest concentration of SARS-CoV-2 viral copies this assay can detect is 138 copies/mL. A negative result does not preclude SARS-Cov-2 infection and should not be used as the sole basis for treatment or other patient management decisions. A negative result may occur with  improper specimen collection/handling, submission of specimen other than nasopharyngeal swab, presence of viral mutation(s) within the areas targeted by this assay, and inadequate number of viral copies(<138 copies/mL). A negative result must be combined with clinical observations, patient history, and epidemiological information. The expected result is Negative.  Fact Sheet for Patients:  BloggerCourse.com  Fact Sheet for Healthcare Providers:  SeriousBroker.it  This test is no t yet approved or cleared by the [place] FDA and  has been authorized for detection and/or diagnosis of SARS-CoV-2 by FDA under an Emergency Use Authorization (EUA). This EUA will remain  in effect (meaning this test can be used) for the duration of the COVID-19 declaration under Section 564(b)(1) of the Act, 21 U.S.C.section 360bbb-3(b)(1), unless the authorization is terminated  or revoked sooner.       Influenza A by PCR NEGATIVE NEGATIVE Final   Influenza B by PCR NEGATIVE NEGATIVE Final    Comment: (NOTE) The Xpert Xpress SARS-CoV-2/FLU/RSV plus assay is intended as an aid in the diagnosis of  influenza from Nasopharyngeal swab specimens and should not be used as a sole basis for treatment. Nasal washings and aspirates are unacceptable for Xpert Xpress SARS-CoV-2/FLU/RSV testing.  Fact Sheet for Patients: BloggerCourse.com  Fact Sheet for Healthcare Providers: SeriousBroker.it  This test is not yet approved or cleared by the [place] FDA and has been authorized for detection and/or diagnosis of SARS-CoV-2 by FDA under an Emergency Use Authorization (EUA). This EUA will remain in effect (meaning this test can be used) for the duration of the COVID-19 declaration under Section 564(b)(1) of the Act, 21 U.S.C. section 360bbb-3(b)(1), unless the authorization is terminated or revoked.     Resp Syncytial Virus by PCR NEGATIVE NEGATIVE Final    Comment: (NOTE) Fact Sheet for Patients: BloggerCourse.com  Fact Sheet for Healthcare Providers: SeriousBroker.it  This test is not yet approved or cleared by the [place] FDA and has been authorized for detection and/or diagnosis of SARS-CoV-2 by FDA under an Emergency Use Authorization (EUA). This EUA will remain in effect (meaning this test can be used) for the  duration of the COVID-19 declaration under Section 564(b)(1) of the Act, 21 U.S.C. section 360bbb-3(b)(1), unless the authorization is terminated or revoked.  Performed at [facility], [address], [place], [place] [address]     Labs: CBC: Recent Labs  Lab 12/25/22 0930 12/25/22 0936 12/26/22 0306  WBC 23.0*  --  24.0*  NEUTROABS 18.4*  --   --   HGB 11.3* 13.6 8.8*  HCT 39.2 40.0 28.1*  MCV 89.1  --  81.7  PLT 526*  --  411*   Basic Metabolic Panel: Recent Labs  Lab 12/25/22 1859 12/25/22 2321 12/26/22 0306 12/26/22 0652 12/26/22 1116  NA 141 136 139 137 137  K 5.5* 3.8 3.9 3.2* 3.1*  CL 114* 110 112* 109 109  CO2 <7*  13* 13* 18* 18*  GLUCOSE 249* 240* 191* 177* 163*  BUN 33* 25* 20 18 15   CREATININE 1.27* 0.99 0.62 0.65 0.53  CALCIUM 9.3 8.5* 8.5* 8.6* 8.5*   Liver Function Tests: Recent Labs  Lab 12/25/22 0930  AST 27  ALT 20  ALKPHOS 162*  BILITOT 1.5*  PROT 9.1*  ALBUMIN 4.4   CBG: Recent Labs  Lab 12/26/22 0847 12/26/22 0946 12/26/22 1044 12/26/22 1144 12/26/22 1244  GLUCAP 136* 149* 160* 159* 154*    Discharge time spent: less than 30 minutes.  Signed: Rickey Barbara, MD [facility] 12/26/2022

## 2022-12-26 NOTE — Inpatient Diabetes Management (Addendum)
Inpatient Diabetes Program Recommendations  AACE/ADA: New Consensus Statement on Inpatient Glycemic Control (2015)  Target Ranges:  Prepandial:   less than 140 mg/dL      Peak postprandial:   less than 180 mg/dL (1-2 hours)      Critically ill patients:  140 - 180 mg/dL    Latest Reference Range & Units 12/14/21 16:30 02/18/22 01:14 05/15/22 21:24 12/12/22 16:03 12/16/22 16:57  Hemoglobin A1C 4.8 - 5.6 % 14.6 (H) 12.2 (H) 14.3 (H) Pend Pend 9.6 (H)  228 mg/dl    Latest Reference Range & Units 12/25/22 09:30  Beta-Hydroxybutyric Acid 0.05 - 0.27 mmol/L >4.50 (H) (C)    Latest Reference Range & Units 12/25/22 09:30  Sodium 135 - 145 mmol/L 133 (L)  Potassium 3.5 - 5.1 mmol/L 6.2 (H)  Chloride 98 - 111 mmol/L 99  CO2 22 - 32 mmol/L <7 (L)  Glucose 70 - 99 mg/dL 161 (HH)  BUN 6 - 20 mg/dL 41 (H)  Creatinine 0.96 - 1.00 mg/dL 0.45 (H)  Calcium 8.9 - 10.3 mg/dL 9.4  Anion gap 5 - 15  NOT CALCULATED    Latest Reference Range & Units 12/25/22 08:26 12/25/22 11:09 12/25/22 11:52 12/25/22 12:35 12/25/22 13:18 12/25/22 14:32 12/25/22 15:37 12/25/22 16:30  Glucose-Capillary 70 - 99 mg/dL 409 (HH) 811 (HH)  IV Insulin Drip Started at 10:35am 495 (H) 455 (H) 397 (H) 334 (H) 230 (H) 204 (H)    Latest Reference Range & Units 12/25/22 17:17 12/25/22 18:22 12/25/22 19:20 12/25/22 20:24 12/25/22 21:28 12/25/22 22:24 12/25/22 23:25 12/26/22 00:23    Latest Reference Range & Units 12/26/22 01:19 12/26/22 02:23 12/26/22 03:28 12/26/22 04:34 12/26/22 05:33 12/26/22 06:36  Glucose-Capillary 70 - 99 mg/dL 914 (H) 782 (H) 956 (H) 177 (H) 170 (H) 160 (H)  IV Insulin Drip Running  (H): Data is abnormally high    Admit with: DKA--pt states off pump for several days but using basal/bolus Just here 12/12 thru 12/14 with DKA  History: Type 1 Diabetes (does NOT make any insulin, requires basal, correction, and carb coverage insulin)   Home DM Meds: Dexcom G7 CGM       Novolog 0-50 units (1 unit for  every 25 grams Carbs + 1 unit for every 50 mg/dl >213)       Insulin Pump??--Omni Pod 5       Humalog Uses with Pump       [name] 30 units daily  Current Orders: IV Insulin Drip   MD- Note 7am BMET shows Anion Gap down to 10 and CO2 level up to 18.  If vomiting has resolved and pt feeling better, may be able to transition to SQ Insulin this AM.  When pt allowed to transition, please consider:  1. Start Semglee 25 units Daily (continue the IV Insulin drip for 2 hours after Semglee on board and then can d/c the IV insulin Drip)  2. Start Novolog Sensitive Correction Scale/ SSI (0-9 units) TID AC + HS  3. If allowed to eat and has appetite, Start Novolog 3 units TID with meals HOLD if pt NPO HOLD if pt eats <50% meals    Addendum 11:45am--Met w/ pt at bedside.  She was very distracted while I was talking to her and kept looking at her text messages.  I asked pt why she was not using her insulin pump when she was admitted--Pt told me she lost the PDM device that she uses to control her insulin pump and was using basal/bolus  injections.  I reviewed the "off pump plan" with her that was given to her by her ENDO at last office visit (see below).  Has insulin and supplies at home.  I discussed with pt that she can switch to using her cell phone to control her Omnipod 5 pump if her phone is compatible, but that she will need to transfer all her pump settings to her phone.  Strongly encouraged pt to call her ENDO office and have them assist her with seeing if her phone is compatible, and if it is, have them help her transfer the pump settings to her phone to allow the phone to control her pump.  Discussed with pt that we will keep her on the IV Insulin drip until her labs look better and then will transition her to basal bolus regimen.       ENDO: Dr. Benjamine Sprague with [facility] Last Seen 11/10/2022 Omnipod 5 w/ Humalog resume insulin pump Basal 1 u/hr Target 150  ICR 25 --> 20 ISF 60 --> 50  If  reverting back to fixed dosing; reduced to 10 to 12 units with meals with close monitoring  Back up plan off insulin pump: [name] 30 units and increased by 2 units every 3 days until fasting BG is 200 mg/dL or less Humalog ICR 1 unit for 20 g carbs  Humalog correctional scale - 1 unit for every 50 above 150 mg/dL    --Will follow patient during hospitalization--  Ambrose Finland RN, MSN, CDCES Diabetes Coordinator Inpatient Glycemic Control Team Team Pager: [phone] (8a-5p)

## 2022-12-27 ENCOUNTER — Telehealth: Payer: Self-pay

## 2022-12-27 LAB — GC/CHLAMYDIA PROBE AMP (~~LOC~~) NOT AT ARMC
Chlamydia: NEGATIVE
Comment: NEGATIVE
Comment: NORMAL
Neisseria Gonorrhea: NEGATIVE

## 2022-12-27 NOTE — Transitions of Care (Post Inpatient/ED Visit) (Signed)
   12/27/2022  Name: Dawn [name] MRN: [id number] DOB: Jan 12, 2000  Today's TOC FU Call Status: Today's TOC FU Call Status:: Unsuccessful Call (1st Attempt) Unsuccessful Call (1st Attempt) Date: 12/27/22  Attempted to reach the patient regarding the most recent Inpatient/ED visit.  Follow Up Plan: Additional outreach attempts will be made to reach the patient to complete the Transitions of Care (Post Inpatient/ED visit) call.   Jodelle Gross RN, BSN, CCM RN Care Manager  Transitions of Care  [facility] - [facility]  [phone]

## 2022-12-30 ENCOUNTER — Telehealth: Payer: Self-pay

## 2022-12-30 NOTE — Patient Outreach (Signed)
error 

## 2022-12-30 NOTE — Transitions of Care (Post Inpatient/ED Visit) (Signed)
12/30/2022  Name: Dawn [name] MRN: [id number] DOB: 06/05/2000  Today's TOC FU Call Status: Today's TOC FU Call Status:: Successful TOC FU Call Completed TOC FU Call Complete Date: 12/30/22 Patient's Name and Date of Birth confirmed.  Transition Care Management Follow-up Telephone Call Date of Discharge: 12/26/22 Discharge Facility: [facility]) Type of Discharge: Inpatient Admission Primary Inpatient Discharge Diagnosis:: Diabetic Ketoacidosis How have you been since you were released from the hospital?: Better Any questions or concerns?: No  Items Reviewed: Did you receive and understand the discharge instructions provided?: Yes Medications obtained,verified, and reconciled?: Yes (Medications Reviewed) Any new allergies since your discharge?: No Dietary orders reviewed?: No  Medications Reviewed Today: Medications Reviewed Today     Reviewed by Jodelle Gross, RN (Case Manager) on 12/30/22 at 1444  Med List Status: <None>   Medication Order Taking? Sig Documenting Provider Last Dose Status Informant  Continuous Glucose Sensor (DEXCOM G7 SENSOR) MISC [id number] Yes Inject 1 Application into the skin as directed. Ivery Quale, MD Taking Active Self, Pharmacy Records  escitalopram [facility]) 20 MG tablet [id number] Yes Take 20 mg by mouth daily as needed. [provider] Taking Active Self, Pharmacy Records  ferrous sulfate 324 MG TBEC [id number] Yes Take 1 tablet (324 mg total) by mouth every other day. Fortunato Curling, DO Taking Active Self, Pharmacy Records  Glucagon [facility] TWO PACK) 3 MG/DOSE POWD [id number] Yes Place 3 mg into the nose as needed (low blood sugar). [provider] Taking Active Self, Pharmacy Records  ibuprofen (ADVIL) 800 MG tablet [id number] Yes Take 800 mg by mouth every 6 (six) hours as needed. [provider] Taking Active Self, Pharmacy Records  Insulin Aspart FlexPen (NOVOLOG) 100 UNIT/ML [id number] Yes Inject 0-50 Units into  the skin See admin instructions. INJECT 1 UNIT FOR EVERY 20G OF CARBS. PLUS 1 UNIT FOR EVERY 50 ABOVE 150 MG/DL PREMEALS. MAX DOSAGE 50U/DAY Jerald Kief, MD Taking Active   insulin degludec ([name]) 100 UNIT/ML FlexTouch Pen [id number] Yes Inject 30 Units into the skin daily. Per your Endocrinologist, increase by 2 units every 3 days until fasting BG is 200mg /dL or less Jerald Kief, MD Taking Active   medroxyPROGESTERone (DEPO-PROVERA) 150 MG/ML injection [id number] No Inject 150 mg into the muscle every 3 (three) months.  Patient not taking: Reported on 12/16/2022   [provider] Not Taking Active Self, Pharmacy Records  norgestimate-ethinyl estradiol (SPRINTEC 28) 0.25-35 MG-MCG tablet [id number] No Take 1 tablet by mouth daily.  Patient not taking: Reported on 12/16/2022   Darral Dash, DO Not Taking Active Self, Pharmacy Records  SUMAtriptan (IMITREX) 25 MG tablet [id number] No Take 0.5 tablets (12.5 mg total) by mouth every 2 (two) hours as needed for migraine. May repeat in 2 hours if headache persists or recurs. Dana Allan, MD Unknown Active Self, Pharmacy Records           Med Note Sherlon Handing, [facility] D   Fri Dec 16, 2022  9:57 AM) unknown  [name] FLEXTOUCH 100 UNIT/ML FlexTouch Pen [id number] Yes Inject 30 Units into the skin daily. [provider] Taking Active Self, Pharmacy Records  Vitamin D, Ergocalciferol, (DRISDOL) 1.25 MG (50000 UNIT) CAPS capsule [id number] Yes Take 50,000 Units by mouth every 7 (seven) days. [provider] Taking Active Self, Pharmacy Records            Home Care and Equipment/Supplies: Were Home Health Services Ordered?: No Any new equipment or medical supplies ordered?: No  Functional Questionnaire: Do you need  assistance with bathing/showering or dressing?: No Do you need assistance with meal preparation?: No Do you need assistance with eating?: No Do you have difficulty maintaining continence: No Do you need  assistance with getting out of bed/getting out of a chair/moving?: No Do you have difficulty managing or taking your medications?: No  Follow up appointments reviewed: PCP Follow-up appointment confirmed?: Yes Date of PCP follow-up appointment?: 01/05/23 Follow-up Provider: Dr. Melissa Noon Special[facility] Follow-up appointment confirmed?: NA Do you need transportation to your follow-up appointment?: No Do you understand care options if your condition(s) worsen?: Yes-patient verbalized understanding  SDOH Interventions Today    Flowsheet Row Most Recent Value  SDOH Interventions   Food Insecurity Interventions Patient Declined  Housing Interventions Patient Declined  Transportation Interventions Patient Declined  Utilities Interventions Patient Declined      TOC Interventions Today    Flowsheet Row Most Recent Value  TOC Interventions   TOC Interventions Discussed/Reviewed TOC Interventions Discussed, TOC Interventions Reviewed, Arranged PCP follow up within 7 days/Care Guide scheduled       Jodelle Gross RN, BSN, CCM RN Care Manager  Transitions of Care  [facility] - [employer]  [phone]

## 2023-01-01 ENCOUNTER — Inpatient Hospital Stay (HOSPITAL_COMMUNITY)
Admission: EM | Admit: 2023-01-01 | Discharge: 2023-01-08 | DRG: 623 | Disposition: A | Discharge status: Home / Self Care | Payer: BLUE CROSS/BLUE SHIELD | Attending: Internal Medicine | Admitting: Internal Medicine

## 2023-01-01 DIAGNOSIS — Z79899 Other long term (current) drug therapy: Secondary | ICD-10-CM

## 2023-01-01 DIAGNOSIS — Z9641 Presence of insulin pump (external) (internal): Secondary | ICD-10-CM | POA: Diagnosis present

## 2023-01-01 DIAGNOSIS — E1010 Type 1 diabetes mellitus with ketoacidosis without coma: Secondary | ICD-10-CM | POA: Diagnosis not present

## 2023-01-01 DIAGNOSIS — L02214 Cutaneous abscess of groin: Secondary | ICD-10-CM | POA: Diagnosis present

## 2023-01-01 DIAGNOSIS — E1110 Type 2 diabetes mellitus with ketoacidosis without coma: Secondary | ICD-10-CM | POA: Diagnosis not present

## 2023-01-01 DIAGNOSIS — N179 Acute kidney failure, unspecified: Secondary | ICD-10-CM | POA: Diagnosis present

## 2023-01-01 DIAGNOSIS — Z833 Family history of diabetes mellitus: Secondary | ICD-10-CM

## 2023-01-01 DIAGNOSIS — F909 Attention-deficit hyperactivity disorder, unspecified type: Secondary | ICD-10-CM | POA: Diagnosis present

## 2023-01-01 DIAGNOSIS — E876 Hypokalemia: Secondary | ICD-10-CM | POA: Diagnosis not present

## 2023-01-01 DIAGNOSIS — Z825 Family history of asthma and other chronic lower respiratory diseases: Secondary | ICD-10-CM

## 2023-01-01 DIAGNOSIS — Z91013 Allergy to seafood: Secondary | ICD-10-CM

## 2023-01-01 DIAGNOSIS — D509 Iron deficiency anemia, unspecified: Secondary | ICD-10-CM | POA: Diagnosis present

## 2023-01-01 DIAGNOSIS — Z794 Long term (current) use of insulin: Secondary | ICD-10-CM

## 2023-01-01 DIAGNOSIS — R030 Elevated blood-pressure reading, without diagnosis of hypertension: Secondary | ICD-10-CM | POA: Diagnosis not present

## 2023-01-01 DIAGNOSIS — J45909 Unspecified asthma, uncomplicated: Secondary | ICD-10-CM | POA: Diagnosis present

## 2023-01-01 DIAGNOSIS — E860 Dehydration: Secondary | ICD-10-CM | POA: Diagnosis present

## 2023-01-01 DIAGNOSIS — Z8249 Family history of ischemic heart disease and other diseases of the circulatory system: Secondary | ICD-10-CM

## 2023-01-01 DIAGNOSIS — Z1152 Encounter for screening for COVID-19: Secondary | ICD-10-CM

## 2023-01-01 DIAGNOSIS — Z91040 Latex allergy status: Secondary | ICD-10-CM

## 2023-01-01 DIAGNOSIS — E875 Hyperkalemia: Secondary | ICD-10-CM | POA: Diagnosis present

## 2023-01-01 DIAGNOSIS — Z91048 Other nonmedicinal substance allergy status: Secondary | ICD-10-CM

## 2023-01-01 LAB — CBG MONITORING, ED: Glucose-Capillary: >600 mg/dL (ref 70–99)

## 2023-01-01 NOTE — ED Triage Notes (Signed)
Patient arrived stating she was recently admitted in DKA and her blood sugar is high. States she has been taking her medications as prescribed. Reports weakness, NV, and SHOB

## 2023-01-02 ENCOUNTER — Emergency Department (HOSPITAL_COMMUNITY): Payer: BLUE CROSS/BLUE SHIELD

## 2023-01-02 DIAGNOSIS — Z833 Family history of diabetes mellitus: Secondary | ICD-10-CM | POA: Diagnosis not present

## 2023-01-02 DIAGNOSIS — E860 Dehydration: Secondary | ICD-10-CM | POA: Diagnosis present

## 2023-01-02 DIAGNOSIS — Z1152 Encounter for screening for COVID-19: Secondary | ICD-10-CM | POA: Diagnosis not present

## 2023-01-02 DIAGNOSIS — E1110 Type 2 diabetes mellitus with ketoacidosis without coma: Secondary | ICD-10-CM | POA: Diagnosis present

## 2023-01-02 DIAGNOSIS — Z91013 Allergy to seafood: Secondary | ICD-10-CM | POA: Diagnosis not present

## 2023-01-02 DIAGNOSIS — R030 Elevated blood-pressure reading, without diagnosis of hypertension: Secondary | ICD-10-CM | POA: Diagnosis not present

## 2023-01-02 DIAGNOSIS — Z8249 Family history of ischemic heart disease and other diseases of the circulatory system: Secondary | ICD-10-CM | POA: Diagnosis not present

## 2023-01-02 DIAGNOSIS — Z825 Family history of asthma and other chronic lower respiratory diseases: Secondary | ICD-10-CM | POA: Diagnosis not present

## 2023-01-02 DIAGNOSIS — J45909 Unspecified asthma, uncomplicated: Secondary | ICD-10-CM | POA: Diagnosis present

## 2023-01-02 DIAGNOSIS — N179 Acute kidney failure, unspecified: Secondary | ICD-10-CM | POA: Diagnosis present

## 2023-01-02 DIAGNOSIS — L02214 Cutaneous abscess of groin: Secondary | ICD-10-CM | POA: Diagnosis present

## 2023-01-02 DIAGNOSIS — Z91048 Other nonmedicinal substance allergy status: Secondary | ICD-10-CM | POA: Diagnosis not present

## 2023-01-02 DIAGNOSIS — F909 Attention-deficit hyperactivity disorder, unspecified type: Secondary | ICD-10-CM | POA: Diagnosis present

## 2023-01-02 DIAGNOSIS — Z794 Long term (current) use of insulin: Secondary | ICD-10-CM | POA: Diagnosis not present

## 2023-01-02 DIAGNOSIS — Z9641 Presence of insulin pump (external) (internal): Secondary | ICD-10-CM | POA: Diagnosis present

## 2023-01-02 DIAGNOSIS — D509 Iron deficiency anemia, unspecified: Secondary | ICD-10-CM | POA: Diagnosis present

## 2023-01-02 DIAGNOSIS — Z91040 Latex allergy status: Secondary | ICD-10-CM | POA: Diagnosis not present

## 2023-01-02 DIAGNOSIS — E876 Hypokalemia: Secondary | ICD-10-CM | POA: Diagnosis not present

## 2023-01-02 DIAGNOSIS — Z79899 Other long term (current) drug therapy: Secondary | ICD-10-CM | POA: Diagnosis not present

## 2023-01-02 DIAGNOSIS — E1010 Type 1 diabetes mellitus with ketoacidosis without coma: Secondary | ICD-10-CM | POA: Diagnosis present

## 2023-01-02 DIAGNOSIS — E875 Hyperkalemia: Secondary | ICD-10-CM | POA: Diagnosis present

## 2023-01-02 DIAGNOSIS — E119 Type 2 diabetes mellitus without complications: Secondary | ICD-10-CM | POA: Diagnosis not present

## 2023-01-02 LAB — BLOOD GAS, VENOUS
Acid-base deficit: 27.6 mmol/L — ABNORMAL HIGH (ref 0.0–2.0)
Bicarbonate: 3.7 mmol/L — ABNORMAL LOW (ref 20.0–28.0)
O2 Saturation: 66.7 %
Patient temperature: 36.1
pCO2, Ven: <18 mm[Hg] — CL (ref 44–60)
pH, Ven: <6.95 — CL (ref 7.25–7.43)
pO2, Ven: 46 mm[Hg] — ABNORMAL HIGH (ref 32–45)

## 2023-01-02 LAB — CBC
HCT: 39.3 % (ref 36.0–46.0)
Hemoglobin: 10.8 g/dL — ABNORMAL LOW (ref 12.0–15.0)
MCH: 24.9 pg — ABNORMAL LOW (ref 26.0–34.0)
MCHC: 27.5 g/dL — ABNORMAL LOW (ref 30.0–36.0)
MCV: 90.6 fL (ref 80.0–100.0)
Platelets: 597 10*3/uL — ABNORMAL HIGH (ref 150–400)
RBC: 4.34 MIL/uL (ref 3.87–5.11)
RDW: 16.3 % — ABNORMAL HIGH (ref 11.5–15.5)
WBC: 24.7 10*3/uL — ABNORMAL HIGH (ref 4.0–10.5)
nRBC: 0 % (ref 0.0–0.2)

## 2023-01-02 LAB — RESPIRATORY PANEL BY PCR

## 2023-01-02 LAB — I-STAT CHEM 8, ED
BUN: 26 mg/dL — ABNORMAL HIGH (ref 6–20)
Calcium, Ion: 1.26 mmol/L (ref 1.15–1.40)
Chloride: 111 mmol/L (ref 98–111)
Creatinine, Ser: 0.9 mg/dL (ref 0.44–1.00)
Glucose, Bld: >700 mg/dL (ref 70–99)
HCT: 39 % (ref 36.0–46.0)
Hemoglobin: 13.3 g/dL (ref 12.0–15.0)
Potassium: 6.5 mmol/L (ref 3.5–5.1)
Sodium: 135 mmol/L (ref 135–145)
TCO2: 6 mmol/L — ABNORMAL LOW (ref 22–32)

## 2023-01-02 LAB — URINALYSIS, ROUTINE W REFLEX MICROSCOPIC
Bilirubin Urine: NEGATIVE
Glucose, UA: >=500 mg/dL — AB
Ketones, ur: 80 mg/dL — AB
Leukocytes,Ua: NEGATIVE
Nitrite: NEGATIVE
Protein, ur: NEGATIVE mg/dL
Specific Gravity, Urine: 1.016 (ref 1.005–1.030)
pH: 5 (ref 5.0–8.0)

## 2023-01-02 LAB — BASIC METABOLIC PANEL
Anion gap: 13 (ref 5–15)
Anion gap: 16 — ABNORMAL HIGH (ref 5–15)
BUN: 23 mg/dL — ABNORMAL HIGH (ref 6–20)
BUN: 20 mg/dL (ref 6–20)
BUN: 18 mg/dL (ref 6–20)
BUN: 12 mg/dL (ref 6–20)
CO2: <7 mmol/L — ABNORMAL LOW (ref 22–32)
CO2: <7 mmol/L — ABNORMAL LOW (ref 22–32)
CO2: 9 mmol/L — ABNORMAL LOW (ref 22–32)
CO2: 11 mmol/L — ABNORMAL LOW (ref 22–32)
Calcium: 8.6 mg/dL — ABNORMAL LOW (ref 8.9–10.3)
Calcium: 8.6 mg/dL — ABNORMAL LOW (ref 8.9–10.3)
Calcium: 8.7 mg/dL — ABNORMAL LOW (ref 8.9–10.3)
Calcium: 8.2 mg/dL — ABNORMAL LOW (ref 8.9–10.3)
Chloride: 119 mmol/L — ABNORMAL HIGH (ref 98–111)
Chloride: 119 mmol/L — ABNORMAL HIGH (ref 98–111)
Chloride: 118 mmol/L — ABNORMAL HIGH (ref 98–111)
Chloride: 113 mmol/L — ABNORMAL HIGH (ref 98–111)
Creatinine, Ser: 1.04 mg/dL — ABNORMAL HIGH (ref 0.44–1.00)
Creatinine, Ser: 0.81 mg/dL (ref 0.44–1.00)
Creatinine, Ser: 0.76 mg/dL (ref 0.44–1.00)
Creatinine, Ser: 0.67 mg/dL (ref 0.44–1.00)
GFR, Estimated: >60 mL/min (ref >60)
GFR, Estimated: >60 mL/min (ref >60)
GFR, Estimated: >60 mL/min (ref >60)
GFR, Estimated: >60 mL/min (ref >60)
Glucose, Bld: 201 mg/dL — ABNORMAL HIGH (ref 70–99)
Glucose, Bld: 245 mg/dL — ABNORMAL HIGH (ref 70–99)
Glucose, Bld: 250 mg/dL — ABNORMAL HIGH (ref 70–99)
Glucose, Bld: 162 mg/dL — ABNORMAL HIGH (ref 70–99)
Potassium: 5 mmol/L (ref 3.5–5.1)
Potassium: 5.1 mmol/L (ref 3.5–5.1)
Potassium: 4.2 mmol/L (ref 3.5–5.1)
Potassium: 3.4 mmol/L — ABNORMAL LOW (ref 3.5–5.1)
Sodium: 141 mmol/L (ref 135–145)
Sodium: 142 mmol/L (ref 135–145)
Sodium: 140 mmol/L (ref 135–145)
Sodium: 140 mmol/L (ref 135–145)

## 2023-01-02 LAB — COMPREHENSIVE METABOLIC PANEL
ALT: 18 U/L (ref 0–44)
AST: 24 U/L (ref 15–41)
Albumin: 3.1 g/dL — ABNORMAL LOW (ref 3.5–5.0)
Alkaline Phosphatase: 219 U/L — ABNORMAL HIGH (ref 38–126)
BUN: 25 mg/dL — ABNORMAL HIGH (ref 6–20)
CO2: <7 mmol/L — ABNORMAL LOW (ref 22–32)
Calcium: 8.3 mg/dL — ABNORMAL LOW (ref 8.9–10.3)
Chloride: 115 mmol/L — ABNORMAL HIGH (ref 98–111)
Creatinine, Ser: 1.2 mg/dL — ABNORMAL HIGH (ref 0.44–1.00)
GFR, Estimated: >60 mL/min (ref >60)
Glucose, Bld: 365 mg/dL — ABNORMAL HIGH (ref 70–99)
Potassium: 4.8 mmol/L (ref 3.5–5.1)
Sodium: 140 mmol/L (ref 135–145)
Total Bilirubin: 1.4 mg/dL — ABNORMAL HIGH (ref <1.2)
Total Protein: 8 g/dL (ref 6.5–8.1)

## 2023-01-02 LAB — BLOOD GAS, ARTERIAL
Acid-base deficit: 21 mmol/L — ABNORMAL HIGH (ref 0.0–2.0)
Bicarbonate: 4.4 mmol/L — ABNORMAL LOW (ref 20.0–28.0)
Drawn by: 331471
FIO2: 21 %
O2 Saturation: 100 %
Patient temperature: 37
pCO2 arterial: <18 mm[Hg] — CL (ref 32–48)
pH, Arterial: 7.21 — ABNORMAL LOW (ref 7.35–7.45)
pO2, Arterial: 127 mm[Hg] — ABNORMAL HIGH (ref 83–108)

## 2023-01-02 LAB — GLUCOSE, CAPILLARY
Glucose-Capillary: 276 mg/dL — ABNORMAL HIGH (ref 70–99)
Glucose-Capillary: 196 mg/dL — ABNORMAL HIGH (ref 70–99)
Glucose-Capillary: 183 mg/dL — ABNORMAL HIGH (ref 70–99)
Glucose-Capillary: 193 mg/dL — ABNORMAL HIGH (ref 70–99)
Glucose-Capillary: 229 mg/dL — ABNORMAL HIGH (ref 70–99)
Glucose-Capillary: 227 mg/dL — ABNORMAL HIGH (ref 70–99)
Glucose-Capillary: 208 mg/dL — ABNORMAL HIGH (ref 70–99)
Glucose-Capillary: 219 mg/dL — ABNORMAL HIGH (ref 70–99)
Glucose-Capillary: 196 mg/dL — ABNORMAL HIGH (ref 70–99)
Glucose-Capillary: 196 mg/dL — ABNORMAL HIGH (ref 70–99)
Glucose-Capillary: 180 mg/dL — ABNORMAL HIGH (ref 70–99)
Glucose-Capillary: 173 mg/dL — ABNORMAL HIGH (ref 70–99)
Glucose-Capillary: 169 mg/dL — ABNORMAL HIGH (ref 70–99)
Glucose-Capillary: 151 mg/dL — ABNORMAL HIGH (ref 70–99)
Glucose-Capillary: 152 mg/dL — ABNORMAL HIGH (ref 70–99)
Glucose-Capillary: 143 mg/dL — ABNORMAL HIGH (ref 70–99)

## 2023-01-02 LAB — BETA-HYDROXYBUTYRIC ACID
Beta-Hydroxybutyric Acid: >8 mmol/L — ABNORMAL HIGH (ref 0.05–0.27)
Beta-Hydroxybutyric Acid: 7.62 mmol/L — ABNORMAL HIGH (ref 0.05–0.27)
Beta-Hydroxybutyric Acid: 3.72 mmol/L — ABNORMAL HIGH (ref 0.05–0.27)
Beta-Hydroxybutyric Acid: 2.63 mmol/L — ABNORMAL HIGH (ref 0.05–0.27)

## 2023-01-02 LAB — CBG MONITORING, ED
Glucose-Capillary: 557 mg/dL (ref 70–99)
Glucose-Capillary: 506 mg/dL (ref 70–99)
Glucose-Capillary: 404 mg/dL — ABNORMAL HIGH (ref 70–99)
Glucose-Capillary: 376 mg/dL — ABNORMAL HIGH (ref 70–99)

## 2023-01-02 LAB — HCG, SERUM, QUALITATIVE: Preg, Serum: NEGATIVE

## 2023-01-02 LAB — MRSA NEXT GEN BY PCR, NASAL: MRSA by PCR Next Gen: DETECTED — AB

## 2023-01-02 MED ORDER — MUPIROCIN CALCIUM 2 % EX CREA: TOPICAL_CREAM | Freq: Two times a day (BID) | CUTANEOUS | Status: DC

## 2023-01-02 MED ORDER — FAMOTIDINE IN NACL 20-0.9 MG/50ML-% IV SOLN
20.0000 mg | Freq: Two times a day (BID) | INTRAVENOUS | Status: DC
  Administered 2023-01-02 – 2023-01-04 (×6): 20 mg via INTRAVENOUS
  Filled 2023-01-02 (×6): qty 50

## 2023-01-02 MED ORDER — DOCUSATE SODIUM 100 MG PO CAPS
100.0000 mg | ORAL_CAPSULE | Freq: Two times a day (BID) | ORAL | Status: DC | PRN
Start: 2023-01-02 — End: 2023-01-08

## 2023-01-02 MED ORDER — LIDOCAINE 5 % EX PTCH
1.0000 | MEDICATED_PATCH | CUTANEOUS | Status: DC
  Administered 2023-01-02 – 2023-01-08 (×6): 1 via TRANSDERMAL
  Filled 2023-01-02 (×7): qty 1

## 2023-01-02 MED ORDER — ORAL CARE MOUTH RINSE: 15.0000 mL | OROMUCOSAL | Status: DC | PRN

## 2023-01-02 MED ORDER — DEXTROSE 50 % IV SOLN: 0.0000 mL | INTRAVENOUS | Status: DC | PRN

## 2023-01-02 MED ORDER — HEPARIN SODIUM (PORCINE) 5000 UNIT/ML IJ SOLN
5000.0000 [IU] | Freq: Three times a day (TID) | INTRAMUSCULAR | Status: DC
  Administered 2023-01-02: 5000 [IU] via SUBCUTANEOUS
  Filled 2023-01-02 (×2): qty 1

## 2023-01-02 MED ORDER — SODIUM CHLORIDE 0.9 % IV BOLUS
2000.0000 mL | Freq: Once | INTRAVENOUS | Status: AC
  Administered 2023-01-02: 2000 mL via INTRAVENOUS

## 2023-01-02 MED ORDER — DEXTROSE IN LACTATED RINGERS 5 % IV SOLN: INTRAVENOUS | Status: AC

## 2023-01-02 MED ORDER — ONDANSETRON HCL 4 MG/2ML IJ SOLN
4.0000 mg | Freq: Four times a day (QID) | INTRAMUSCULAR | Status: DC | PRN
  Administered 2023-01-02 (×2): 4 mg via INTRAVENOUS
  Filled 2023-01-02 (×2): qty 2

## 2023-01-02 MED ORDER — SODIUM CHLORIDE 0.9 % IV SOLN
12.5000 mg | Freq: Once | INTRAVENOUS | Status: AC
  Administered 2023-01-02: 12.5 mg via INTRAVENOUS
  Filled 2023-01-02: qty 12.5

## 2023-01-02 MED ORDER — TRAMADOL HCL 50 MG PO TABS
50.0000 mg | ORAL_TABLET | Freq: Two times a day (BID) | ORAL | Status: DC | PRN
  Administered 2023-01-02 – 2023-01-08 (×13): 50 mg via ORAL
  Filled 2023-01-02 (×13): qty 1

## 2023-01-02 MED ORDER — LACTATED RINGERS IV SOLN: INTRAVENOUS | Status: AC

## 2023-01-02 MED ORDER — POLYETHYLENE GLYCOL 3350 17 G PO PACK: 17.0000 g | PACK | Freq: Every day | ORAL | Status: DC | PRN

## 2023-01-02 MED ORDER — CHLORHEXIDINE GLUCONATE CLOTH 2 % EX PADS
6.0000 | MEDICATED_PAD | Freq: Every day | CUTANEOUS | Status: DC
  Administered 2023-01-02 – 2023-01-04 (×2): 6 via TOPICAL

## 2023-01-02 MED ORDER — SODIUM CHLORIDE 0.9 % IV SOLN: INTRAVENOUS | Status: AC

## 2023-01-02 MED ORDER — INSULIN REGULAR(HUMAN) IN NACL 100-0.9 UT/100ML-% IV SOLN
INTRAVENOUS | Status: DC
  Administered 2023-01-02: 2.8 [IU]/h via INTRAVENOUS
  Administered 2023-01-02: 7 [IU]/h via INTRAVENOUS
  Filled 2023-01-02 (×2): qty 100

## 2023-01-02 MED ORDER — MUPIROCIN 2 % EX OINT
1.0000 | TOPICAL_OINTMENT | Freq: Two times a day (BID) | CUTANEOUS | Status: AC
  Administered 2023-01-02 – 2023-01-06 (×10): 1 via NASAL
  Filled 2023-01-02 (×4): qty 22

## 2023-01-02 MED ORDER — ACETAMINOPHEN 325 MG PO TABS
650.0000 mg | ORAL_TABLET | Freq: Four times a day (QID) | ORAL | Status: DC | PRN
  Administered 2023-01-02 – 2023-01-07 (×2): 650 mg via ORAL
  Filled 2023-01-02 (×4): qty 2

## 2023-01-02 NOTE — Plan of Care (Signed)
  Problem: Clinical Measurements: Goal: Ability to maintain clinical measurements within normal limits will improve Outcome: Progressing Goal: Cardiovascular complication will be avoided Outcome: Progressing   Problem: Coping: Goal: Level of anxiety will decrease Outcome: Progressing   Problem: Pain Management: Goal: General experience of comfort will improve Outcome: Progressing   Problem: Skin Integrity: Goal: Risk for impaired skin integrity will decrease Outcome: Progressing   Problem: Metabolic: Goal: Ability to maintain appropriate glucose levels will improve Outcome: Progressing   Problem: Skin Integrity: Goal: Risk for impaired skin integrity will decrease Outcome: Progressing   Problem: Metabolic: Goal: Ability to maintain appropriate glucose levels will improve Outcome: Progressing

## 2023-01-02 NOTE — ED Notes (Signed)
CBG 506 per endotool no rate/dose change needed at this time. Next CBG check @0422 .

## 2023-01-02 NOTE — ED Provider Notes (Cosign Needed)
[place] EMERGENCY DEPARTMENT AT [facility] Provider Note   CSN: [id number] Arrival date & time: 01/01/23  2315     History  Chief Complaint  Patient presents with   Hyperglycemia    Dawn [name] is a 22 y.o. female.  Patient with history of T1DM presents with elevated blood sugar despite her report of using her insulin as directed via pump. Symptoms started today and include nausea, vomiting and SOB. No fever.   The history is provided by the patient. No language interpreter was used.  Hyperglycemia      Home Medications Prior to Admission medications   Medication Sig Start Date End Date Taking? Authorizing Provider  Continuous Glucose Sensor (DEXCOM G7 SENSOR) MISC Inject 1 Application into the skin as directed. 10/30/22   Ivery Quale, MD  escitalopram (LEXAPRO) 20 MG tablet Take 20 mg by mouth daily as needed. 10/04/22 01/02/23  [provider]  ferrous sulfate 324 MG TBEC Take 1 tablet (324 mg total) by mouth every other day. 11/02/22   Fortunato Curling, DO  Glucagon (BAQSIMI TWO PACK) 3 MG/DOSE POWD Place 3 mg into the nose as needed (low blood sugar).    [provider]  ibuprofen (ADVIL) 800 MG tablet Take 800 mg by mouth every 6 (six) hours as needed. 12/11/22   [provider]  Insulin Aspart FlexPen (NOVOLOG) 100 UNIT/ML Inject 0-50 Units into the skin See admin instructions. INJECT 1 UNIT FOR EVERY 20G OF CARBS. PLUS 1 UNIT FOR EVERY 50 ABOVE 150 MG/DL PREMEALS. MAX DOSAGE 50U/DAY 12/26/22   Jerald Kief, MD  insulin degludec ([name]) 100 UNIT/ML FlexTouch Pen Inject 30 Units into the skin daily. Per your Endocrinologist, increase by 2 units every 3 days until fasting BG is 200mg /dL or less 84/13/24   Jerald Kief, MD  medroxyPROGESTERone (DEPO-PROVERA) 150 MG/ML injection Inject 150 mg into the muscle every 3 (three) months. Patient not taking: Reported on 12/16/2022 12/11/22   [provider]  norgestimate-ethinyl  estradiol (SPRINTEC 28) 0.25-35 MG-MCG tablet Take 1 tablet by mouth daily. Patient not taking: Reported on 12/16/2022 12/12/22   Darral Dash, DO  SUMAtriptan (IMITREX) 25 MG tablet Take 0.5 tablets (12.5 mg total) by mouth every 2 (two) hours as needed for migraine. May repeat in 2 hours if headache persists or recurs. 04/12/21   Dana Allan, MD  [name] FLEXTOUCH 100 UNIT/ML FlexTouch Pen Inject 30 Units into the skin daily.    [provider]  Vitamin D, Ergocalciferol, (DRISDOL) 1.25 MG (50000 UNIT) CAPS capsule Take 50,000 Units by mouth every 7 (seven) days. 05/24/22   [provider]      Allergies    Latex, Tape, and Shellfish allergy    Review of Systems   Review of Systems  Physical Exam Updated Vital Signs BP 133/75 (BP Location: Left Arm)   Pulse (!) 118   Temp 98 F (36.7 C) (Oral)   Resp (!) 22   SpO2 100%  Physical Exam Vitals and nursing note reviewed.  Constitutional:      Appearance: She is well-developed.  HENT:     Head: Normocephalic.  Cardiovascular:     Rate and Rhythm: Normal rate and regular rhythm.  Pulmonary:     Breath sounds: Normal breath sounds. No wheezing, rhonchi or rales.     Comments: Kussmaul breathing Abdominal:     Palpations: Abdomen is soft.     Tenderness: There is abdominal tenderness (Diffusely tender). There is no guarding or  rebound.  Musculoskeletal:        General: Normal range of motion.     Cervical back: Normal range of motion and neck supple.     Right lower leg: No edema.     Left lower leg: No edema.  Skin:    General: Skin is warm and dry.     Coloration: Skin is not pale.  Neurological:     Mental Status: She is alert and oriented to person, place, and time.     ED Results / Procedures / Treatments   Labs (all labs ordered are listed, but only abnormal results are displayed) Labs Reviewed  CBC - Abnormal; Notable for the following components:      Result Value   WBC 24.7 (*)     Hemoglobin 10.8 (*)    MCH 24.9 (*)    MCHC 27.5 (*)    RDW 16.3 (*)    Platelets 597 (*)    All other components within normal limits  URINALYSIS, ROUTINE W REFLEX MICROSCOPIC - Abnormal; Notable for the following components:   Color, Urine COLORLESS (*)    Glucose, UA >=500 (*)    Hgb urine dipstick SMALL (*)    Ketones, ur 80 (*)    Bacteria, UA RARE (*)    All other components within normal limits  BLOOD GAS, VENOUS - Abnormal; Notable for the following components:   pH, Ven <6.95 (*)    pCO2, Ven <18 (*)    pO2, Ven 46 (*)    Bicarbonate 3.7 (*)    Acid-base deficit 27.6 (*)    All other components within normal limits  BETA-HYDROXYBUTYRIC ACID - Abnormal; Notable for the following components:   Beta-Hydroxybutyric Acid >8.00 (*)    All other components within normal limits  CBG MONITORING, ED - Abnormal; Notable for the following components:   Glucose-Capillary >600 (*)    All other components within normal limits  I-STAT CHEM 8, ED - Abnormal; Notable for the following components:   Potassium 6.5 (*)    BUN 26 (*)    Glucose, Bld >700 (*)    TCO2 6 (*)    All other components within normal limits  CBG MONITORING, ED - Abnormal; Notable for the following components:   Glucose-Capillary 557 (*)    All other components within normal limits  CBG MONITORING, ED - Abnormal; Notable for the following components:   Glucose-Capillary 506 (*)    All other components within normal limits  HCG, SERUM, QUALITATIVE   Results for orders placed or performed during the hospital encounter of 01/01/23  CBG monitoring, ED   Collection Time: 01/01/23 11:28 PM  Result Value Ref Range   Glucose-Capillary >600 (HH) 70 - 99 mg/dL  CBC   Collection Time: 01/02/23 12:53 AM  Result Value Ref Range   WBC 24.7 (H) 4.0 - 10.5 K/uL   RBC 4.34 3.87 - 5.11 MIL/uL   Hemoglobin 10.8 (L) 12.0 - 15.0 g/dL   HCT 66.4 40.3 - 47.4 %   MCV 90.6 80.0 - 100.0 fL   MCH 24.9 (L) 26.0 - 34.0 pg   MCHC  27.5 (L) 30.0 - 36.0 g/dL   RDW 25.9 (H) 56.3 - 87.5 %   Platelets 597 (H) 150 - 400 K/uL   nRBC 0.0 0.0 - 0.2 %  Urinalysis, Routine w reflex microscopic -Urine, Clean Catch   Collection Time: 01/02/23 12:53 AM  Result Value Ref Range   Color, Urine COLORLESS (A) YELLOW  APPearance CLEAR CLEAR   Specific Gravity, Urine 1.016 1.005 - 1.030   pH 5.0 5.0 - 8.0   Glucose, UA >=500 (A) NEGATIVE mg/dL   Hgb urine dipstick SMALL (A) NEGATIVE   Bilirubin Urine NEGATIVE NEGATIVE   Ketones, ur 80 (A) NEGATIVE mg/dL   Protein, ur NEGATIVE NEGATIVE mg/dL   Nitrite NEGATIVE NEGATIVE   Leukocytes,Ua NEGATIVE NEGATIVE   RBC / HPF 0-5 0 - 5 RBC/hpf   WBC, UA 0-5 0 - 5 WBC/hpf   Bacteria, UA RARE (A) NONE SEEN   Squamous Epithelial / HPF 0-5 0 - 5 /HPF  Beta-hydroxybutyric acid   Collection Time: 01/02/23  1:55 AM  Result Value Ref Range   Beta-Hydroxybutyric Acid >8.00 (H) 0.05 - 0.27 mmol/L  Blood gas, venous (at [facility] and [facility])   Collection Time: 01/02/23  2:30 AM  Result Value Ref Range   pH, Ven <6.95 (LL) 7.25 - 7.43   pCO2, Ven <18 (LL) 44 - 60 mmHg   pO2, Ven 46 (H) 32 - 45 mmHg   Bicarbonate 3.7 (L) 20.0 - 28.0 mmol/L   Acid-base deficit 27.6 (H) 0.0 - 2.0 mmol/L   O2 Saturation 66.7 %   Patient temperature 36.1   I-stat chem 8, ed   Collection Time: 01/02/23  2:33 AM  Result Value Ref Range   Sodium 135 135 - 145 mmol/L   Potassium 6.5 (HH) 3.5 - 5.1 mmol/L   Chloride 111 98 - 111 mmol/L   BUN 26 (H) 6 - 20 mg/dL   Creatinine, Ser 5.73 0.44 - 1.00 mg/dL   Glucose, Bld >220 (HH) 70 - 99 mg/dL   Calcium, Ion 2.54 2.70 - 1.40 mmol/L   TCO2 6 (L) 22 - 32 mmol/L   Hemoglobin 13.3 12.0 - 15.0 g/dL   HCT 62.3 76.2 - 83.1 %  CBG monitoring, ED   Collection Time: 01/02/23  2:54 AM  Result Value Ref Range   Glucose-Capillary 557 (HH) 70 - 99 mg/dL   Comment 1 Document in Chart   CBG monitoring, ED   Collection Time: 01/02/23  3:43 AM  Result Value Ref Range   Glucose-Capillary  506 (HH) 70 - 99 mg/dL   Comment 1 Notify RN     EKG None  Radiology No results found.  Procedures .Critical Care  Performed by: Elpidio Anis, PA-C Authorized by: Elpidio Anis, PA-C   Critical care provider statement:    Critical care time (minutes):  40   Critical care was necessary to treat or prevent imminent or life-threatening deterioration of the following conditions:  Endocrine crisis   Critical care was time spent personally by me on the following activities:  Discussions with consultants, evaluation of patient's response to treatment, examination of patient, ordering and review of laboratory studies, re-evaluation of patient's condition and review of old charts     Medications Ordered in ED Medications  insulin regular, human (MYXREDLIN) 100 units/ 100 mL infusion (7 Units/hr Intravenous New Bag/Given 01/02/23 0319)  dextrose 5 % in lactated ringers infusion (has no administration in time range)  dextrose 50 % solution 0-50 mL (has no administration in time range)  0.9 %  sodium chloride infusion ( Intravenous New Bag/Given 01/02/23 0404)  sodium chloride 0.9 % bolus 2,000 mL (0 mLs Intravenous Stopped 01/02/23 0404)    ED Course/ Medical Decision Making/ A&P Clinical Course as of 01/02/23 0411  Mon Jan 02, 2023  0203 IV attempts made.  Unable to get blood.  Initial IV  infiltrated.  Discussed with patient potential need for central line. [CH]    Clinical Course User Index [CH] Horton, Mayer Masker, MD                                 Medical Decision Making This patient presents to the ED for concern of high blood sugar, this involves an extensive number of treatment options, and is a complaint that carries with it a high risk of complications and morbidity.  The differential diagnosis includes DKA, uncomplicated hyperglycemia, infection/sepsis   Co morbidities that complicate the patient evaluation  T1DM with recent   Additional history  obtained:  Additional history and/or information obtained from chart review, notable for EMR previous admission notes, admission 12/12, 12/22 both for DKA   Lab Tests:  I Ordered, and personally interpreted labs.  The pertinent results include:   pH 6.95, pCO2 18, Betahydroxy >8, postassium 6.5 with glucose >700.    Imaging Studies ordered:  I ordered imaging studies including n/a I independently visualized and interpreted imaging which showed n/a I agree with the radiologist interpretation   Cardiac Monitoring:  The patient was maintained on a cardiac monitor.  I personally viewed and interpreted the cardiac monitored which showed an underlying rhythm of: sinus tachycardia   Medicines ordered and prescription drug management:  I ordered medication including insulin via Endotool  for hyperglycemia Reevaluation of the patient after these medicines showed that the patient improved Repeat CBG 557, 506 I have reviewed the patients home medicines and have made adjustments as needed    Critical Interventions:  Insulin, fluid boluses, critical care consultation   Consultations Obtained:  I requested consultation with the critical care,  and discussed lab and imaging findings as well as pertinent plan - they recommend: they will see and admit   Problem List / ED Course:  DKA in T1DM with frequent admissions for same Immediate aggressive care initiated in ED including fluids, insulin drip, symptomatic treatment for nausea   Reevaluation:  After the interventions noted above, I reevaluated the patient and found that they have :stayed the same   Disposition:  After consideration of the diagnostic results and the patients response to treatment, I feel that the patient would benefit from admit.   Amount and/or Complexity of Data Reviewed Labs: ordered.  Risk Prescription drug management. Decision regarding hospitalization.           Final Clinical  Impression(s) / ED Diagnoses Final diagnoses:  Type 1 diabetes mellitus with ketoacidosis without coma [facility])    Rx / DC Orders ED Discharge Orders     None         Elpidio Anis, PA-C 01/02/23 0411

## 2023-01-02 NOTE — Inpatient Diabetes Management (Signed)
Inpatient Diabetes Program Recommendations  AACE/ADA: New Consensus Statement on Inpatient Glycemic Control (2015)  Target Ranges:  Prepandial:   less than 140 mg/dL      Peak postprandial:   less than 180 mg/dL (1-2 hours)      Critically ill patients:  140 - 180 mg/dL   Lab Results  Component Value Date   GLUCAP 227 (H) 01/02/2023   HGBA1C 9.6 (H) 12/16/2022    Review of Glycemic Control  Diabetes history: DM1 Outpatient Diabetes medications: OmniPod insulin pump with Dexcom G7 CGM, uses Humalog in pump. Uses [name] 30 daily as back up basal insulin in case of pump failure Current orders for Inpatient glycemic control: IV insulin per EndoTool for DKA (last reading 4.4 units/H)  Last BHB - 7.62, next BHB at 1700. HgbA1C - 9.6%  Inpatient Diabetes Program Recommendations:    Continue IV insulin per EndoTool until criteria met for discontinuation. Give basal insulin 1-2H prior to discontinuation of drip.  Recommend when ready for transition:  Semglee 25 Q24H  Novolog 0-9 units Q4H  Novolog 4 units TID when po intake is > 50%.  Spoke with Diabetes Coordinator on 12/16/22.  Thank you. Ailene Ards, RD, LDN, CDCES Inpatient Diabetes Coordinator [phone]
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
## 2023-01-02 NOTE — ED Notes (Signed)
CBG 404 per endotool no rate/dose change needed at this time. Next CBG check @0524 .

## 2023-01-02 NOTE — Progress Notes (Signed)
eLink Physician-Brief Progress Note Patient Name: Dawn [name] DOB: Jul 26, 2000 MRN: [id number]   Date of Service  01/02/2023  HPI/Events of Note   22 yo F w/ pertinent PMH T1DM on insulin pump, ADHD, asthma, Fe def anemia,  presents to [facility] ED on 12/30 with DKA.   Refusing heparin x2 days  eICU Interventions  Hold per patient preference. Low risk.     Intervention Category Minor Interventions: Routine modifications to care plan (e.g. PRN medications for pain, fever)  Gabrial Domine 01/02/2023, 11:13 PM

## 2023-01-02 NOTE — Progress Notes (Signed)
eLink Physician-Brief Progress Note Patient Name: Dawn [name] DOB: 2000/04/21 MRN: [id number]   Date of Service  01/02/2023  HPI/Events of Note  New pt eval  eICU Interventions     DKA  IVF Electrolytes monitoring Insulin drip      Massie Maroon 01/02/2023, 6:17 AM
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
## 2023-01-03 ENCOUNTER — Encounter: Payer: Self-pay | Admitting: Student

## 2023-01-03 DIAGNOSIS — E1010 Type 1 diabetes mellitus with ketoacidosis without coma: Secondary | ICD-10-CM | POA: Diagnosis not present

## 2023-01-03 LAB — GLUCOSE, CAPILLARY
Glucose-Capillary: 136 mg/dL — ABNORMAL HIGH (ref 70–99)
Glucose-Capillary: 137 mg/dL — ABNORMAL HIGH (ref 70–99)
Glucose-Capillary: 142 mg/dL — ABNORMAL HIGH (ref 70–99)
Glucose-Capillary: 141 mg/dL — ABNORMAL HIGH (ref 70–99)
Glucose-Capillary: 152 mg/dL — ABNORMAL HIGH (ref 70–99)
Glucose-Capillary: 137 mg/dL — ABNORMAL HIGH (ref 70–99)
Glucose-Capillary: 124 mg/dL — ABNORMAL HIGH (ref 70–99)
Glucose-Capillary: 166 mg/dL — ABNORMAL HIGH (ref 70–99)
Glucose-Capillary: 143 mg/dL — ABNORMAL HIGH (ref 70–99)
Glucose-Capillary: 160 mg/dL — ABNORMAL HIGH (ref 70–99)
Glucose-Capillary: 160 mg/dL — ABNORMAL HIGH (ref 70–99)
Glucose-Capillary: 125 mg/dL — ABNORMAL HIGH (ref 70–99)
Glucose-Capillary: 133 mg/dL — ABNORMAL HIGH (ref 70–99)
Glucose-Capillary: 155 mg/dL — ABNORMAL HIGH (ref 70–99)
Glucose-Capillary: 75 mg/dL (ref 70–99)

## 2023-01-03 LAB — BASIC METABOLIC PANEL
Anion gap: 11 (ref 5–15)
Anion gap: 7 (ref 5–15)
Anion gap: 6 (ref 5–15)
Anion gap: 7 (ref 5–15)
Anion gap: 6 (ref 5–15)
BUN: 12 mg/dL (ref 6–20)
BUN: 9 mg/dL (ref 6–20)
BUN: 8 mg/dL (ref 6–20)
BUN: 5 mg/dL — ABNORMAL LOW (ref 6–20)
BUN: <5 mg/dL — ABNORMAL LOW (ref 6–20)
CO2: 14 mmol/L — ABNORMAL LOW (ref 22–32)
CO2: 18 mmol/L — ABNORMAL LOW (ref 22–32)
CO2: 19 mmol/L — ABNORMAL LOW (ref 22–32)
CO2: 19 mmol/L — ABNORMAL LOW (ref 22–32)
CO2: 21 mmol/L — ABNORMAL LOW (ref 22–32)
Calcium: 8.5 mg/dL — ABNORMAL LOW (ref 8.9–10.3)
Calcium: 8.6 mg/dL — ABNORMAL LOW (ref 8.9–10.3)
Calcium: 8.6 mg/dL — ABNORMAL LOW (ref 8.9–10.3)
Calcium: 8.3 mg/dL — ABNORMAL LOW (ref 8.9–10.3)
Calcium: 8.6 mg/dL — ABNORMAL LOW (ref 8.9–10.3)
Chloride: 111 mmol/L (ref 98–111)
Chloride: 116 mmol/L — ABNORMAL HIGH (ref 98–111)
Chloride: 115 mmol/L — ABNORMAL HIGH (ref 98–111)
Chloride: 112 mmol/L — ABNORMAL HIGH (ref 98–111)
Chloride: 108 mmol/L (ref 98–111)
Creatinine, Ser: 0.66 mg/dL (ref 0.44–1.00)
Creatinine, Ser: 0.61 mg/dL (ref 0.44–1.00)
Creatinine, Ser: 0.6 mg/dL (ref 0.44–1.00)
Creatinine, Ser: 0.58 mg/dL (ref 0.44–1.00)
Creatinine, Ser: 0.55 mg/dL (ref 0.44–1.00)
GFR, Estimated: >60 mL/min (ref >60)
GFR, Estimated: >60 mL/min (ref >60)
GFR, Estimated: >60 mL/min (ref >60)
GFR, Estimated: >60 mL/min (ref >60)
GFR, Estimated: >60 mL/min (ref >60)
Glucose, Bld: 167 mg/dL — ABNORMAL HIGH (ref 70–99)
Glucose, Bld: 160 mg/dL — ABNORMAL HIGH (ref 70–99)
Glucose, Bld: 169 mg/dL — ABNORMAL HIGH (ref 70–99)
Glucose, Bld: 160 mg/dL — ABNORMAL HIGH (ref 70–99)
Glucose, Bld: 92 mg/dL (ref 70–99)
Potassium: 3.3 mmol/L — ABNORMAL LOW (ref 3.5–5.1)
Potassium: 3 mmol/L — ABNORMAL LOW (ref 3.5–5.1)
Potassium: 3 mmol/L — ABNORMAL LOW (ref 3.5–5.1)
Potassium: 3.5 mmol/L (ref 3.5–5.1)
Potassium: 2.9 mmol/L — ABNORMAL LOW (ref 3.5–5.1)
Sodium: 136 mmol/L (ref 135–145)
Sodium: 141 mmol/L (ref 135–145)
Sodium: 140 mmol/L (ref 135–145)
Sodium: 138 mmol/L (ref 135–145)
Sodium: 135 mmol/L (ref 135–145)

## 2023-01-03 LAB — CBC
HCT: 29.8 % — ABNORMAL LOW (ref 36.0–46.0)
Hemoglobin: 9.3 g/dL — ABNORMAL LOW (ref 12.0–15.0)
MCH: 25.8 pg — ABNORMAL LOW (ref 26.0–34.0)
MCHC: 31.2 g/dL (ref 30.0–36.0)
MCV: 82.5 fL (ref 80.0–100.0)
Platelets: 426 10*3/uL — ABNORMAL HIGH (ref 150–400)
RBC: 3.61 MIL/uL — ABNORMAL LOW (ref 3.87–5.11)
RDW: 16.7 % — ABNORMAL HIGH (ref 11.5–15.5)
WBC: 14.7 10*3/uL — ABNORMAL HIGH (ref 4.0–10.5)
nRBC: 0 % (ref 0.0–0.2)

## 2023-01-03 LAB — BETA-HYDROXYBUTYRIC ACID
Beta-Hydroxybutyric Acid: 1.51 mmol/L — ABNORMAL HIGH (ref 0.05–0.27)
Beta-Hydroxybutyric Acid: 0.59 mmol/L — ABNORMAL HIGH (ref 0.05–0.27)
Beta-Hydroxybutyric Acid: 0.49 mmol/L — ABNORMAL HIGH (ref 0.05–0.27)

## 2023-01-03 MED ORDER — POTASSIUM CHLORIDE 10 MEQ/100ML IV SOLN
10.0000 meq | INTRAVENOUS | Status: AC
  Administered 2023-01-03 (×2): 10 meq via INTRAVENOUS
  Filled 2023-01-03 (×2): qty 100

## 2023-01-03 MED ORDER — INSULIN GLARGINE-YFGN 100 UNIT/ML ~~LOC~~ SOLN
25.0000 [IU] | Freq: Every day | SUBCUTANEOUS | Status: DC
  Administered 2023-01-03 – 2023-01-07 (×5): 25 [IU] via SUBCUTANEOUS
  Filled 2023-01-03 (×5): qty 0.25

## 2023-01-03 MED ORDER — DEXTROSE IN LACTATED RINGERS 5 % IV SOLN: INTRAVENOUS | Status: DC

## 2023-01-03 MED ORDER — POTASSIUM CHLORIDE CRYS ER 20 MEQ PO TBCR
20.0000 meq | EXTENDED_RELEASE_TABLET | ORAL | Status: AC
  Administered 2023-01-03 (×2): 20 meq via ORAL
  Filled 2023-01-03 (×2): qty 1

## 2023-01-03 MED ORDER — INSULIN ASPART 100 UNIT/ML IJ SOLN: 0.0000 [IU] | Freq: Every day | INTRAMUSCULAR | Status: DC

## 2023-01-03 MED ORDER — INSULIN ASPART 100 UNIT/ML IJ SOLN
0.0000 [IU] | Freq: Three times a day (TID) | INTRAMUSCULAR | Status: DC
  Administered 2023-01-03: 2 [IU] via SUBCUTANEOUS
  Administered 2023-01-04: 1 [IU] via SUBCUTANEOUS
  Administered 2023-01-04: 3 [IU] via SUBCUTANEOUS
  Administered 2023-01-05 (×2): 5 [IU] via SUBCUTANEOUS
  Administered 2023-01-06: 3 [IU] via SUBCUTANEOUS
  Administered 2023-01-06: 1 [IU] via SUBCUTANEOUS
  Administered 2023-01-07 (×2): 2 [IU] via SUBCUTANEOUS
  Administered 2023-01-07: 5 [IU] via SUBCUTANEOUS
  Administered 2023-01-08: 2 [IU] via SUBCUTANEOUS

## 2023-01-03 MED ORDER — INSULIN ASPART 100 UNIT/ML IJ SOLN
4.0000 [IU] | Freq: Three times a day (TID) | INTRAMUSCULAR | Status: DC
  Administered 2023-01-04 – 2023-01-07 (×6): 4 [IU] via SUBCUTANEOUS

## 2023-01-03 NOTE — Progress Notes (Signed)
 Patient kept removing Blood Pressure cuff off her arm all shift.  RN asked if she could put cuff on her leg and she refused in the morning.  She allowed RN to put on her leg in the afternoon but would not let her leave it on.  RN would go into room and take BP on her leg when she got blood sugars for ENDO tool.  Patient also gave phlebotomy trouble with blood sticks.  She refused several blood draws by different techs and required second techs to come down and try sticks.  Refused blood draws off fingers
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
## 2023-01-03 NOTE — Progress Notes (Signed)
PCCM Progress Note  AG closed. CO2 >18. BHA 0.59. After discussion with patient will transition to diet and off insulin gtt early with plan for close monitoring  Full liquid carb modified diet ordered Insulin orders placed per DM management team

## 2023-01-03 NOTE — Inpatient Diabetes Management (Signed)
 Inpatient Diabetes Program Recommendations  AACE/ADA: New Consensus Statement on Inpatient Glycemic Control (2015)  Target Ranges:  Prepandial:   less than 140 mg/dL      Peak postprandial:   less than 180 mg/dL (1-2 hours)      Critically ill patients:  140 - 180 mg/dL   Lab Results  Component Value Date   GLUCAP 160 (H) 01/03/2023   HGBA1C 9.6 (H) 12/16/2022    Review of Glycemic Control  Diabetes history: DM1 Outpatient Diabetes medications: OmniPod with Dexcom G7 CGM, uses humalog  in pump Uses [name]  30 daily as back up basal insulin  in case of pump failure Current orders for Inpatient glycemic control: IV insulin  per End[facility], ready to transition to basal/bolus. Awaiting BHB.  HgbA1C - 9.6%  Omnipod 5 w/ Humalog  resume insulin  pump Basal 1 unit/hr   Basal 1 u/hr Target 150  ICR 25 --> 20 ISF 60 --> 50  If reverting back to fixed dosing; reduced to 10 to 12 units with meals with close monitoring  Back up plan off insulin  pump: [name]  30 units and increased by 2 units every 3 days until fasting BG is 200 mg/dL or less Humalog  ICR 1 unit for 20 g carbs  Humalog  correctional scale - 1 unit for every 50 above 150 mg/dL.   Inpatient Diabetes Program Recommendations:    Recommend when ready for transition:   Semglee  25 Q24H   Novolog  0-9 units Q4H   Novolog  4 units TID when po intake is > 50%.  Spoke with pt at bedside. States she has been using her insulin  pump but could not control blood sugars prior to coming to ED. States she had very bad cold and thinks it was causing blood sugars to increase. When asked about bolusing for hyperglycemia, pt states she did.Said she is still homeless and working at [employer]. Needs to get out of this hospital so she can go back to work. Pt states it's y'all's fault that I went into DKA, because I was in the Emergency Room a few days ago and they sent me home.  Will follow-up with pt in am.   Thank you. Shona Brandy, RD,  LDN, CDCES Inpatient Diabetes Coordinator [phone]

## 2023-01-03 NOTE — Plan of Care (Signed)
  Problem: Clinical Measurements: Goal: Respiratory complications will improve Outcome: Progressing Goal: Cardiovascular complication will be avoided Outcome: Progressing   Problem: Activity: Goal: Risk for activity intolerance will decrease Outcome: Progressing   Problem: Coping: Goal: Level of anxiety will decrease Outcome: Progressing   Problem: Elimination: Goal: Will not experience complications related to urinary retention Outcome: Progressing   Problem: Pain Management: Goal: General experience of comfort will improve Outcome: Progressing   Problem: Safety: Goal: Ability to remain free from injury will improve Outcome: Progressing   Problem: Skin Integrity: Goal: Risk for impaired skin integrity will decrease Outcome: Progressing   Problem: Tissue Perfusion: Goal: Adequacy of tissue perfusion will improve Outcome: Progressing

## 2023-01-04 ENCOUNTER — Other Ambulatory Visit: Payer: Self-pay

## 2023-01-04 ENCOUNTER — Encounter (HOSPITAL_COMMUNITY): Payer: Self-pay | Admitting: Pulmonary Disease

## 2023-01-04 DIAGNOSIS — E1010 Type 1 diabetes mellitus with ketoacidosis without coma: Secondary | ICD-10-CM | POA: Diagnosis not present

## 2023-01-04 LAB — GLUCOSE, CAPILLARY
Glucose-Capillary: 86 mg/dL (ref 70–99)
Glucose-Capillary: 202 mg/dL — ABNORMAL HIGH (ref 70–99)
Glucose-Capillary: 130 mg/dL — ABNORMAL HIGH (ref 70–99)
Glucose-Capillary: 199 mg/dL — ABNORMAL HIGH (ref 70–99)

## 2023-01-04 LAB — BASIC METABOLIC PANEL
Anion gap: 6 (ref 5–15)
Anion gap: 6 (ref 5–15)
Anion gap: 7 (ref 5–15)
BUN: <5 mg/dL — ABNORMAL LOW (ref 6–20)
BUN: <5 mg/dL — ABNORMAL LOW (ref 6–20)
BUN: <5 mg/dL — ABNORMAL LOW (ref 6–20)
CO2: 23 mmol/L (ref 22–32)
CO2: 24 mmol/L (ref 22–32)
CO2: 20 mmol/L — ABNORMAL LOW (ref 22–32)
Calcium: 8.3 mg/dL — ABNORMAL LOW (ref 8.9–10.3)
Calcium: 8.5 mg/dL — ABNORMAL LOW (ref 8.9–10.3)
Calcium: 8.9 mg/dL (ref 8.9–10.3)
Chloride: 107 mmol/L (ref 98–111)
Chloride: 108 mmol/L (ref 98–111)
Chloride: 110 mmol/L (ref 98–111)
Creatinine, Ser: 0.44 mg/dL (ref 0.44–1.00)
Creatinine, Ser: 0.44 mg/dL (ref 0.44–1.00)
Creatinine, Ser: 0.64 mg/dL (ref 0.44–1.00)
GFR, Estimated: >60 mL/min (ref >60)
GFR, Estimated: >60 mL/min (ref >60)
GFR, Estimated: >60 mL/min (ref >60)
Glucose, Bld: 93 mg/dL (ref 70–99)
Glucose, Bld: 101 mg/dL — ABNORMAL HIGH (ref 70–99)
Glucose, Bld: 206 mg/dL — ABNORMAL HIGH (ref 70–99)
Potassium: 3 mmol/L — ABNORMAL LOW (ref 3.5–5.1)
Potassium: 3.9 mmol/L (ref 3.5–5.1)
Potassium: 4.3 mmol/L (ref 3.5–5.1)
Sodium: 136 mmol/L (ref 135–145)
Sodium: 138 mmol/L (ref 135–145)
Sodium: 137 mmol/L (ref 135–145)

## 2023-01-04 LAB — BETA-HYDROXYBUTYRIC ACID
Beta-Hydroxybutyric Acid: 0.2 mmol/L (ref 0.05–0.27)
Beta-Hydroxybutyric Acid: 0.09 mmol/L (ref 0.05–0.27)
Beta-Hydroxybutyric Acid: 0.28 mmol/L — ABNORMAL HIGH (ref 0.05–0.27)

## 2023-01-04 LAB — MAGNESIUM: Magnesium: 1.6 mg/dL — ABNORMAL LOW (ref 1.7–2.4)

## 2023-01-04 MED ORDER — POTASSIUM CHLORIDE CRYS ER 20 MEQ PO TBCR
40.0000 meq | EXTENDED_RELEASE_TABLET | ORAL | Status: AC
  Administered 2023-01-04 (×3): 40 meq via ORAL
  Filled 2023-01-04 (×3): qty 2

## 2023-01-04 MED ORDER — MAGNESIUM SULFATE 2 GM/50ML IV SOLN
2.0000 g | Freq: Once | INTRAVENOUS | Status: AC
  Administered 2023-01-04: 2 g via INTRAVENOUS
  Filled 2023-01-04: qty 50

## 2023-01-04 NOTE — Progress Notes (Signed)
 eLink Physician-Brief Progress Note Patient Name: Dawn [name] DOB: 02-Nov-2000 MRN: [id number]   Date of Service  01/04/2023  HPI/Events of Note  23 year old presented with diabetic ketoacidosis, status post insulin  infusion, transition in the evening to clear liquid diet.  Labs afterwards show hypokalemia to 2.9.  Beta hydroxybutyrate down to 0.5.  eICU Interventions  Still at risk of reopening gap but will replete potassium for now.  Continue to monitor with subcutaneous insulin .     Intervention Category Minor Interventions: Electrolytes abnormality - evaluation and management  [name] 01/04/2023, 2:47 AM

## 2023-01-04 NOTE — Plan of Care (Signed)
   Problem: Metabolic: Goal: Ability to maintain appropriate glucose levels will improve Outcome: Progressing   Problem: Nutritional: Goal: Maintenance of adequate nutrition will improve Outcome: Progressing Goal: Progress toward achieving an optimal weight will improve Outcome: Progressing
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
## 2023-01-05 ENCOUNTER — Inpatient Hospital Stay: Payer: MEDICAID | Admitting: Student

## 2023-01-05 DIAGNOSIS — E1010 Type 1 diabetes mellitus with ketoacidosis without coma: Secondary | ICD-10-CM | POA: Diagnosis not present

## 2023-01-05 LAB — CBC
HCT: 31 % — ABNORMAL LOW (ref 36.0–46.0)
Hemoglobin: 9.7 g/dL — ABNORMAL LOW (ref 12.0–15.0)
MCH: 25.5 pg — ABNORMAL LOW (ref 26.0–34.0)
MCHC: 31.3 g/dL (ref 30.0–36.0)
MCV: 81.6 fL (ref 80.0–100.0)
Platelets: 412 10*3/uL — ABNORMAL HIGH (ref 150–400)
RBC: 3.8 MIL/uL — ABNORMAL LOW (ref 3.87–5.11)
RDW: 16.7 % — ABNORMAL HIGH (ref 11.5–15.5)
WBC: 8.3 10*3/uL (ref 4.0–10.5)
nRBC: 0 % (ref 0.0–0.2)

## 2023-01-05 LAB — GLUCOSE, CAPILLARY
Glucose-Capillary: 269 mg/dL — ABNORMAL HIGH (ref 70–99)
Glucose-Capillary: 101 mg/dL — ABNORMAL HIGH (ref 70–99)
Glucose-Capillary: 44 mg/dL — CL (ref 70–99)
Glucose-Capillary: 58 mg/dL — ABNORMAL LOW (ref 70–99)
Glucose-Capillary: 276 mg/dL — ABNORMAL HIGH (ref 70–99)
Glucose-Capillary: 112 mg/dL — ABNORMAL HIGH (ref 70–99)

## 2023-01-05 LAB — BASIC METABOLIC PANEL
Anion gap: 9 (ref 5–15)
Anion gap: 7 (ref 5–15)
BUN: 7 mg/dL (ref 6–20)
BUN: 6 mg/dL (ref 6–20)
CO2: 22 mmol/L (ref 22–32)
CO2: 25 mmol/L (ref 22–32)
Calcium: 9 mg/dL (ref 8.9–10.3)
Calcium: 9 mg/dL (ref 8.9–10.3)
Chloride: 101 mmol/L (ref 98–111)
Chloride: 103 mmol/L (ref 98–111)
Creatinine, Ser: 0.65 mg/dL (ref 0.44–1.00)
Creatinine, Ser: 0.59 mg/dL (ref 0.44–1.00)
GFR, Estimated: >60 mL/min (ref >60)
GFR, Estimated: >60 mL/min (ref >60)
Glucose, Bld: 336 mg/dL — ABNORMAL HIGH (ref 70–99)
Glucose, Bld: 281 mg/dL — ABNORMAL HIGH (ref 70–99)
Potassium: 3.9 mmol/L (ref 3.5–5.1)
Potassium: 4.1 mmol/L (ref 3.5–5.1)
Sodium: 132 mmol/L — ABNORMAL LOW (ref 135–145)
Sodium: 135 mmol/L (ref 135–145)

## 2023-01-05 LAB — BETA-HYDROXYBUTYRIC ACID
Beta-Hydroxybutyric Acid: 0.71 mmol/L — ABNORMAL HIGH (ref 0.05–0.27)
Beta-Hydroxybutyric Acid: 0.11 mmol/L (ref 0.05–0.27)
Beta-Hydroxybutyric Acid: 0.11 mmol/L (ref 0.05–0.27)

## 2023-01-05 LAB — MAGNESIUM: Magnesium: 2.4 mg/dL (ref 1.7–2.4)

## 2023-01-05 MED ORDER — DEXTROSE 50 % IV SOLN
25.0000 g | INTRAVENOUS | Status: AC
  Filled 2023-01-05: qty 50

## 2023-01-05 MED ORDER — FAMOTIDINE 20 MG PO TABS
20.0000 mg | ORAL_TABLET | Freq: Two times a day (BID) | ORAL | Status: DC
  Administered 2023-01-05 – 2023-01-08 (×7): 20 mg via ORAL
  Filled 2023-01-05 (×8): qty 1

## 2023-01-05 MED ORDER — INFLUENZA VIRUS VACC SPLIT PF (FLUZONE) 0.5 ML IM SUSY: 0.5000 mL | PREFILLED_SYRINGE | INTRAMUSCULAR | Status: DC | PRN

## 2023-01-05 NOTE — Anesthesia Preprocedure Evaluation (Signed)
 Anesthesia Evaluation  Patient identified by MRN, date of birth, ID band Patient awake    Reviewed: Allergy & Precautions, NPO status , Patient's Chart, lab work & pertinent test results  Airway Mallampati: I  TM Distance: >3 FB Neck ROM: Full    Dental  (+) Teeth Intact, Dental Advisory Given   Pulmonary asthma    breath sounds clear to auscultation       Cardiovascular negative cardio ROS  Rhythm:Regular Rate:Normal     Neuro/Psych  Headaches PSYCHIATRIC DISORDERS  Depression       GI/Hepatic negative GI ROS, Neg liver ROS,,,  Endo/Other  diabetes, Poorly Controlled, Type 2, Insulin  Dependent    Renal/GU negative Renal ROS  negative genitourinary   Musculoskeletal   Abdominal Normal abdominal exam  (+)   Peds  (+) ADHD Hematology negative hematology ROS (+)   Anesthesia Other Findings   Reproductive/Obstetrics                              Anesthesia Physical Anesthesia Plan  ASA: 3  Anesthesia Plan: General   Post-op Pain Management: Tylenol  PO (pre-op)* and Celebrex  PO (pre-op)*   Induction: Intravenous  PONV Risk Score and Plan: 4 or greater and Ondansetron , Midazolam , Treatment may vary due to age or medical condition and Dexamethasone   Airway Management Planned: LMA  Additional Equipment: None  Intra-op Plan:   Post-operative Plan: Extubation in OR  Informed Consent: I have reviewed the patients History and Physical, chart, labs and discussed the procedure including the risks, benefits and alternatives for the proposed anesthesia with the patient or authorized representative who has indicated his/her understanding and acceptance.     Dental advisory given  Plan Discussed with: CRNA and Anesthesiologist  Anesthesia Plan Comments:          Anesthesia Quick Evaluation

## 2023-01-05 NOTE — Progress Notes (Signed)
 PROGRESS NOTE    Dawn [name]  FMW:[id number] DOB: 2000/02/20 DOA: 01/01/2023 PCP: Dartha Geralds, DO   Brief Narrative: This 23 years old female with PMH significant for type 1 diabetes on insulin  pump, ADHD, asthma, iron  deficiency anemia presented in the ED with uncontrolled blood glucose, found to have DKA.  Patient has been admitted 3 times this month with DKA.  Most recent hospitalization on 12/22 as a result of URI.  Patient states she has been using her insulin  as directed via pump.  Patient has developed nausea, vomiting and shortness of breath this time, denies any fever.  Blood glucose found to be above 600, UA shows ketones,  beta-hydroxybutyrate >8.  Patient was admitted in the ICU and started on insulin  drip and IV fluids as per protocol.  Patient also has leukocytosis.  Patient smokes weed every 3 days.  Denies any alcohol or other  illicit drug use. PCCM pickup 01/05/2023.  She is now off insulin  drip, successfully transitioned on subcu insulin .  Patient reports having left groin abscess for some time.  General surgery is consulted.  Assessment & Plan:   Active Problems:   DKA (diabetic ketoacidosis) (HCC)  Diabetic Ketoacidosis: Patient presented with nausea, vomiting, SOB found to have DKA. She was managed in the ICU with IV insulin  and fluids as per DKA protocol. Now she is out of DKA, successfully transitioned to subcu insulin . Continue Semglee  25 units daily, Continue regular insulin  sliding scale. She has high risk for DKA recurrence. Needs extensive DM education and outpatient close follow-up with endocrinology.  Left groin abscess: Patient reports having small swelling in the left groin which has been increasing and now started draining. General surgery consulted.  Awaiting recommendation.  Leukocytosis: Could be reactive in the setting of DKA. Resolved.  Chronic iron  deficiency anemia: Trend CBC. Continue ferrous sulfate  3 times daily.  Asthma: Remains  stable. Continue albuterol  as needed  Hypokalemia: Replaced.  Continue to monitor  ADHD: Resume home medications.   DVT prophylaxis: SCDs Code Status: Full code Family Communication: No family at bedside Disposition Plan:  Status is: Inpatient Remains inpatient appropriate because: Admitted for DKA and now improving.  Patient has left groin abscess General Surgery consulted.   Consultants:  General surgery  Procedures:  None  Antimicrobials:  Anti-infectives (From admission, onward)    None      Subjective: Patient was seen and examined at bedside.  Overnight events noted. Patient reports feeling much improved.  She reports having pain in the left groin, small abscess. She is tolerating carb modified diet, out of DKA.  Objective: Vitals:   01/05/23 0400 01/05/23 0500 01/05/23 0744 01/05/23 0805  BP:    (!) 130/97  Pulse: 86 91  85  Resp: 15 18  15   Temp: 98.4 F (36.9 C)  97.9 F (36.6 C)   TempSrc: Oral  Oral   SpO2: 100% 100%  100%  Weight:      Height:        Intake/Output Summary (Last 24 hours) at 01/05/2023 1134 Last data filed at 01/04/2023 2311 Gross per 24 hour  Intake 386.73 ml  Output --  Net 386.73 ml   Filed Weights   01/02/23 0621 01/03/23 0500 01/04/23 0500  Weight: 48.9 kg 52 kg 52 kg    Examination:  General exam: Appears calm and comfortable , not in any acute distress. Respiratory system: Clear to auscultation. Respiratory effort normal.  RR 15 Cardiovascular system: S1 & S2 heard, RRR. No JVD, murmurs,  rubs, gallops or clicks. No pedal edema. Genitourinary : Small golf size swelling noted in the left groin, tender, hard. Gastrointestinal system: Abdomen is non distended, soft and non tender. Normal bowel sounds heard. Central nervous system: Alert and oriented x 3. No focal neurological deficits. Extremities: No edema , no cyanosis, no clubbing. Skin: No rashes, lesions or ulcers Psychiatry: Judgement and insight appear normal.  Mood & affect appropriate.     Data Reviewed: I have personally reviewed following labs and imaging studies  CBC: Recent Labs  Lab 01/02/23 0053 01/02/23 0233 01/03/23 0906 01/05/23 0306  WBC 24.7*  --  14.7* 8.3  HGB 10.8* 13.3 9.3* 9.7*  HCT 39.3 39.0 29.8* 31.0*  MCV 90.6  --  82.5 81.6  PLT 597*  --  426* 412*   Basic Metabolic Panel: Recent Labs  Lab 01/03/23 2048 01/04/23 0426 01/04/23 0852 01/04/23 1354 01/05/23 0306  NA 135 136 138 137 132*  K 2.9* 3.0* 3.9 4.3 3.9  CL 108 107 108 110 101  CO2 21* 23 24 20* 22  GLUCOSE 92 93 101* 206* 336*  BUN <5* <5* <5* <5* 7  CREATININE 0.55 0.44 0.44 0.64 0.65  CALCIUM  8.6* 8.3* 8.5* 8.9 9.0  MG  --   --   --  1.6* 2.4   GFR: Estimated Creatinine Clearance: 79.9 mL/min (by C-G formula based on SCr of 0.65 mg/dL). Liver Function Tests: Recent Labs  Lab 01/02/23 0539  AST 24  ALT 18  ALKPHOS 219*  BILITOT 1.4*  PROT 8.0  ALBUMIN 3.1*   No results for input(s): LIPASE, AMYLASE in the last 168 hours. No results for input(s): AMMONIA in the last 168 hours. Coagulation Profile: No results for input(s): INR, PROTIME in the last 168 hours. Cardiac Enzymes: No results for input(s): CKTOTAL, CKMB, CKMBINDEX, TROPONINI in the last 168 hours. BNP (last 3 results) No results for input(s): PROBNP in the last 8760 hours. HbA1C: No results for input(s): HGBA1C in the last 72 hours. CBG: Recent Labs  Lab 01/04/23 0751 01/04/23 1204 01/04/23 1724 01/04/23 2132 01/05/23 0744  GLUCAP 86 202* 130* 199* 269*   Lipid Profile: No results for input(s): CHOL, HDL, LDLCALC, TRIG, CHOLHDL, LDLDIRECT in the last 72 hours. Thyroid  Function Tests: No results for input(s): TSH, T4TOTAL, FREET4, T3FREE, THYROIDAB in the last 72 hours. Anemia Panel: No results for input(s): VITAMINB12, FOLATE, FERRITIN, TIBC, IRON , RETICCTPCT in the last 72 hours. Sepsis Labs: No results  for input(s): PROCALCITON, LATICACIDVEN in the last 168 hours.  Recent Results (from the past 240 hours)  Respiratory (~20 pathogens) panel by PCR     Status: None   Collection Time: 01/02/23  5:40 AM   Specimen: Nasopharyngeal Swab; Respiratory  Result Value Ref Range Status   Adenovirus NOT DETECTED NOT DETECTED Final   Coronavirus 229E NOT DETECTED NOT DETECTED Final    Comment: (NOTE) The Coronavirus on the Respiratory Panel, DOES NOT test for the novel  Coronavirus (2019 nCoV)    Coronavirus HKU1 NOT DETECTED NOT DETECTED Final   Coronavirus NL63 NOT DETECTED NOT DETECTED Final   Coronavirus OC43 NOT DETECTED NOT DETECTED Final   Metapneumovirus NOT DETECTED NOT DETECTED Final   Rhinovirus / Enterovirus NOT DETECTED NOT DETECTED Final   Influenza A NOT DETECTED NOT DETECTED Final   Influenza B NOT DETECTED NOT DETECTED Final   Parainfluenza Virus 1 NOT DETECTED NOT DETECTED Final   Parainfluenza Virus 2 NOT DETECTED NOT DETECTED Final   Parainfluenza Virus  3 NOT DETECTED NOT DETECTED Final   Parainfluenza Virus 4 NOT DETECTED NOT DETECTED Final   Respiratory Syncytial Virus NOT DETECTED NOT DETECTED Final   Bordetella pertussis NOT DETECTED NOT DETECTED Final   Bordetella Parapertussis NOT DETECTED NOT DETECTED Final   Chlamydophila pneumoniae NOT DETECTED NOT DETECTED Final   Mycoplasma pneumoniae NOT DETECTED NOT DETECTED Final    Comment: Performed at [facility], [address], [place], [place] [address]  MRSA Next Gen by PCR, Nasal     Status: Abnormal   Collection Time: 01/02/23  8:10 AM   Specimen: Nasal Mucosa; Nasal Swab  Result Value Ref Range Status   MRSA by PCR Next Gen DETECTED (A) NOT DETECTED Final    Comment: (NOTE) The GeneXpert MRSA Assay (FDA approved for NASAL specimens only), is one component of a comprehensive MRSA colonization surveillance program. It is not intended to diagnose MRSA infection nor to guide or monitor treatment for MRSA  infections. Test performance is not FDA approved in patients less than 72 years old. Performed at [facility], [address] [place], [place] [address]     Radiology Studies: No results found.  Scheduled Meds:  Chlorhexidine  Gluconate Cloth  6 each Topical Daily   famotidine   20 mg Oral BID   insulin  aspart  0-5 Units Subcutaneous QHS   insulin  aspart  0-9 Units Subcutaneous TID WC   insulin  aspart  4 Units Subcutaneous TID WC   insulin  glargine-yfgn  25 Units Subcutaneous Daily   lidocaine   1 patch Transdermal Q24H   mupirocin  ointment  1 Application Nasal BID   Continuous Infusions:   LOS: 3 days    Time spent: 50 mins    Darcel Dawley, MD [facility]   If 7PM-7AM, please contact night-coverage

## 2023-01-05 NOTE — Plan of Care (Signed)

## 2023-01-05 NOTE — Progress Notes (Signed)
 Hypoglycemic Event  CBG: 58 at 11:33  Treatment: 8 oz juice/soda  Symptoms: None  Follow-up CBG: Time:1158 CBG Result:44  Possible Reasons for Event: Unknown  Comments/MD notified:MD advised to give 1 amp D50 after recheck was lower.  Pt refused, chose to drink another 8oz juice.  RN educated patient on importance of maintaining normal blood sugar.  Rechecked after 2nd juice. CBG 101 at 1222.   Dawn [name] 01/05/2023 12:57 PM

## 2023-01-05 NOTE — Consult Note (Signed)
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     Status: Abnormal   Collection Time: 01/05/23 11:33 AM  Result Value Ref Range   Glucose-Capillary 58 (L) 70 - 99 mg/dL    Comment: Glucose reference range applies only to samples taken after fasting for at least 8 hours.    No results found.   A/P: Dawn [name] is an 23 y.o. female with what appears to be a L groin infected sebaceous cyst.  I have recommended I&D.  We will schedule this for tomorrow since the patient is not NPO today.      Bernarda JAYSON Ned, MD  Colorectal and General Surgery [facility]   Medical decision making was straightforward.

## 2023-01-05 NOTE — Consult Note (Signed)
 WOC Nurse Consult Note: Reason for Consult: Left inguinal purulent wound, present on admission. WBC 8 at this time. Patient has history of abscess to axilla and labia.   Wound type:infectious Pressure Injury POA: NA Measurement: 0.2 cm open lesion to left inguinal fold.  Creamy purulence noted on bandage and with gentle assessment.  Wound bed:not visible.  Drainage (amount, consistency, odor) moderate creamy purulence  no odor noted Periwound:  indurated and tender to touch.  Dressing procedure/placement/frequency: cleanse wound to left inguinal fold with soap and water .  Apply aquacel to open area.  [facility] # [id number]) and cover with foam dressing. Change Monday and Thursday and PRN soilage.  Will not follow at this time.  Please re-consult if needed.  Darice Cooley MSN, RN, FNP-BC CWON Wound, Ostomy, Continence Nurse Outpatient [facility] [phone] Pager [phone]

## 2023-01-05 NOTE — H&P (View-Only) (Signed)
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     Status: Abnormal   Collection Time: 01/05/23 11:33 AM  Result Value Ref Range   Glucose-Capillary 58 (L) 70 - 99 mg/dL    Comment: Glucose reference range applies only to samples taken after fasting for at least 8 hours.    No results found.   A/P: Maryana Pittmon is an 23 y.o. female with what appears to be a L groin infected sebaceous cyst.  I have recommended I&D.  We will schedule this for tomorrow since the patient is not NPO today.      Bernarda JAYSON Ned, MD  Colorectal and General Surgery [facility]   Medical decision making was straightforward.

## 2023-01-05 NOTE — Plan of Care (Signed)
  Problem: Nutrition: Goal: Adequate nutrition will be maintained Outcome: Progressing   Problem: Coping: Goal: Level of anxiety will decrease Outcome: Progressing   Problem: Elimination: Goal: Will not experience complications related to bowel motility Outcome: Progressing Goal: Will not experience complications related to urinary retention Outcome: Progressing   Problem: Pain Management: Goal: General experience of comfort will improve Outcome: Progressing   Problem: Safety: Goal: Ability to remain free from injury will improve Outcome: Progressing   Problem: Fluid Volume: Goal: Ability to maintain a balanced intake and output will improve Outcome: Progressing   Problem: Metabolic: Goal: Ability to maintain appropriate glucose levels will improve Outcome: Progressing   Problem: Nutritional: Goal: Maintenance of adequate nutrition will improve Outcome: Progressing   Problem: Skin Integrity: Goal: Risk for impaired skin integrity will decrease Outcome: Progressing   Problem: Tissue Perfusion: Goal: Adequacy of tissue perfusion will improve Outcome: Progressing   Problem: Metabolic: Goal: Ability to maintain appropriate glucose levels will improve Outcome: Progressing   Problem: Respiratory: Goal: Will regain and/or maintain adequate ventilation Outcome: Progressing

## 2023-01-06 ENCOUNTER — Encounter (HOSPITAL_COMMUNITY)
Admission: EM | Disposition: A | Discharge status: Home / Self Care | Payer: Self-pay | Source: Home / Self Care | Attending: Pulmonary Disease

## 2023-01-06 ENCOUNTER — Inpatient Hospital Stay (HOSPITAL_COMMUNITY): Payer: BLUE CROSS/BLUE SHIELD | Admitting: Anesthesiology

## 2023-01-06 ENCOUNTER — Other Ambulatory Visit: Payer: Self-pay

## 2023-01-06 DIAGNOSIS — J45909 Unspecified asthma, uncomplicated: Secondary | ICD-10-CM

## 2023-01-06 DIAGNOSIS — E1010 Type 1 diabetes mellitus with ketoacidosis without coma: Principal | ICD-10-CM

## 2023-01-06 DIAGNOSIS — L02214 Cutaneous abscess of groin: Secondary | ICD-10-CM | POA: Diagnosis not present

## 2023-01-06 DIAGNOSIS — E119 Type 2 diabetes mellitus without complications: Secondary | ICD-10-CM

## 2023-01-06 HISTORY — PX: INCISION AND DRAINAGE ABSCESS: SHX5864

## 2023-01-06 LAB — CBC
HCT: 31.7 % — ABNORMAL LOW (ref 36.0–46.0)
Hemoglobin: 9.4 g/dL — ABNORMAL LOW (ref 12.0–15.0)
MCH: 25.3 pg — ABNORMAL LOW (ref 26.0–34.0)
MCHC: 29.7 g/dL — ABNORMAL LOW (ref 30.0–36.0)
MCV: 85.2 fL (ref 80.0–100.0)
Platelets: 393 10*3/uL (ref 150–400)
RBC: 3.72 MIL/uL — ABNORMAL LOW (ref 3.87–5.11)
RDW: 16.8 % — ABNORMAL HIGH (ref 11.5–15.5)
WBC: 7 10*3/uL (ref 4.0–10.5)
nRBC: 0 % (ref 0.0–0.2)

## 2023-01-06 LAB — BASIC METABOLIC PANEL
Anion gap: 8 (ref 5–15)
BUN: 11 mg/dL (ref 6–20)
CO2: 25 mmol/L (ref 22–32)
Calcium: 9.1 mg/dL (ref 8.9–10.3)
Chloride: 102 mmol/L (ref 98–111)
Creatinine, Ser: 0.6 mg/dL (ref 0.44–1.00)
GFR, Estimated: >60 mL/min (ref >60)
Glucose, Bld: 230 mg/dL — ABNORMAL HIGH (ref 70–99)
Potassium: 3.7 mmol/L (ref 3.5–5.1)
Sodium: 135 mmol/L (ref 135–145)

## 2023-01-06 LAB — GLUCOSE, CAPILLARY
Glucose-Capillary: 176 mg/dL — ABNORMAL HIGH (ref 70–99)
Glucose-Capillary: 168 mg/dL — ABNORMAL HIGH (ref 70–99)
Glucose-Capillary: 216 mg/dL — ABNORMAL HIGH (ref 70–99)
Glucose-Capillary: 252 mg/dL — ABNORMAL HIGH (ref 70–99)
Glucose-Capillary: 213 mg/dL — ABNORMAL HIGH (ref 70–99)
Glucose-Capillary: 190 mg/dL — ABNORMAL HIGH (ref 70–99)
Glucose-Capillary: 143 mg/dL — ABNORMAL HIGH (ref 70–99)
Glucose-Capillary: 147 mg/dL — ABNORMAL HIGH (ref 70–99)

## 2023-01-06 LAB — PHOSPHORUS: Phosphorus: 3.2 mg/dL (ref 2.5–4.6)

## 2023-01-06 LAB — MAGNESIUM: Magnesium: 1.8 mg/dL (ref 1.7–2.4)

## 2023-01-06 LAB — BETA-HYDROXYBUTYRIC ACID: Beta-Hydroxybutyric Acid: 0.35 mmol/L — ABNORMAL HIGH (ref 0.05–0.27)

## 2023-01-06 SURGERY — INCISION AND DRAINAGE, ABSCESS
Anesthesia: General | Laterality: Left

## 2023-01-06 MED ORDER — OXYCODONE HCL 5 MG PO TABS: 5.0000 mg | ORAL_TABLET | Freq: Once | ORAL | Status: DC | PRN

## 2023-01-06 MED ORDER — CEFAZOLIN SODIUM-DEXTROSE 2-4 GM/100ML-% IV SOLN
2.0000 g | Freq: Three times a day (TID) | INTRAVENOUS | Status: DC
  Administered 2023-01-06 – 2023-01-08 (×6): 2 g via INTRAVENOUS
  Filled 2023-01-06 (×6): qty 100

## 2023-01-06 MED ORDER — VANCOMYCIN HCL 500 MG/100ML IV SOLN
500.0000 mg | Freq: Two times a day (BID) | INTRAVENOUS | Status: DC
  Filled 2023-01-06 (×2): qty 100

## 2023-01-06 MED ORDER — FENTANYL CITRATE PF 50 MCG/ML IJ SOSY
25.0000 ug | PREFILLED_SYRINGE | INTRAMUSCULAR | Status: DC | PRN
  Administered 2023-01-06 (×2): 50 ug via INTRAVENOUS

## 2023-01-06 MED ORDER — CELECOXIB 200 MG PO CAPS
200.0000 mg | ORAL_CAPSULE | Freq: Once | ORAL | Status: AC
  Administered 2023-01-06: 200 mg via ORAL
  Filled 2023-01-06: qty 1

## 2023-01-06 MED ORDER — MIDAZOLAM HCL 2 MG/2ML IJ SOLN
INTRAMUSCULAR | Status: AC
  Filled 2023-01-06: qty 2

## 2023-01-06 MED ORDER — LACTATED RINGERS IV SOLN: INTRAVENOUS | Status: DC

## 2023-01-06 MED ORDER — CEFAZOLIN SODIUM-DEXTROSE 2-4 GM/100ML-% IV SOLN
INTRAVENOUS | Status: AC
  Filled 2023-01-06: qty 100

## 2023-01-06 MED ORDER — FENTANYL CITRATE (PF) 100 MCG/2ML IJ SOLN
INTRAMUSCULAR | Status: DC | PRN
  Administered 2023-01-06 (×2): 25 ug via INTRAVENOUS

## 2023-01-06 MED ORDER — MIDAZOLAM HCL 5 MG/5ML IJ SOLN
INTRAMUSCULAR | Status: DC | PRN
  Administered 2023-01-06: 1 mg via INTRAVENOUS

## 2023-01-06 MED ORDER — CHLORHEXIDINE GLUCONATE 0.12 % MT SOLN
15.0000 mL | Freq: Once | OROMUCOSAL | Status: AC
  Administered 2023-01-06: 15 mL via OROMUCOSAL

## 2023-01-06 MED ORDER — FENTANYL CITRATE (PF) 100 MCG/2ML IJ SOLN
INTRAMUSCULAR | Status: AC
  Filled 2023-01-06: qty 2

## 2023-01-06 MED ORDER — LIDOCAINE 2% (20 MG/ML) 5 ML SYRINGE
INTRAMUSCULAR | Status: DC | PRN
  Administered 2023-01-06: 100 mg via INTRAVENOUS

## 2023-01-06 MED ORDER — OXYCODONE HCL 5 MG/5ML PO SOLN: 5.0000 mg | Freq: Once | ORAL | Status: DC | PRN

## 2023-01-06 MED ORDER — PHENYLEPHRINE 80 MCG/ML (10ML) SYRINGE FOR IV PUSH (FOR BLOOD PRESSURE SUPPORT)
PREFILLED_SYRINGE | INTRAVENOUS | Status: DC | PRN
  Administered 2023-01-06 (×2): 80 ug via INTRAVENOUS

## 2023-01-06 MED ORDER — DIPHENHYDRAMINE HCL 50 MG/ML IJ SOLN
12.5000 mg | Freq: Once | INTRAMUSCULAR | Status: AC
  Administered 2023-01-06: 12.5 mg via INTRAVENOUS

## 2023-01-06 MED ORDER — PHENYLEPHRINE 80 MCG/ML (10ML) SYRINGE FOR IV PUSH (FOR BLOOD PRESSURE SUPPORT)
PREFILLED_SYRINGE | INTRAVENOUS | Status: AC
Start: 2023-01-06 — End: ?
  Filled 2023-01-06: qty 10

## 2023-01-06 MED ORDER — PROPOFOL 10 MG/ML IV BOLUS
INTRAVENOUS | Status: DC | PRN
  Administered 2023-01-06: 100 mg via INTRAVENOUS
  Administered 2023-01-06: 50 mg via INTRAVENOUS

## 2023-01-06 MED ORDER — VANCOMYCIN HCL IN DEXTROSE 1-5 GM/200ML-% IV SOLN
1000.0000 mg | Freq: Once | INTRAVENOUS | Status: AC
  Administered 2023-01-06: 1000 mg via INTRAVENOUS
  Filled 2023-01-06: qty 200

## 2023-01-06 MED ORDER — ACETAMINOPHEN 160 MG/5ML PO SOLN: 325.0000 mg | ORAL | Status: DC | PRN

## 2023-01-06 MED ORDER — MEPERIDINE HCL 50 MG/ML IJ SOLN
6.2500 mg | INTRAMUSCULAR | Status: DC | PRN
Start: 2023-01-06 — End: 2023-01-06

## 2023-01-06 MED ORDER — ONDANSETRON HCL 4 MG/2ML IJ SOLN
4.0000 mg | Freq: Once | INTRAMUSCULAR | Status: DC | PRN
Start: 2023-01-06 — End: 2023-01-06

## 2023-01-06 MED ORDER — ACETAMINOPHEN 500 MG PO TABS
1000.0000 mg | ORAL_TABLET | Freq: Once | ORAL | Status: AC
  Administered 2023-01-06: 1000 mg via ORAL
  Filled 2023-01-06: qty 2

## 2023-01-06 MED ORDER — ACETAMINOPHEN 325 MG PO TABS: 325.0000 mg | ORAL_TABLET | ORAL | Status: DC | PRN

## 2023-01-06 MED ORDER — CALAMINE EX LOTN: 1.0000 | TOPICAL_LOTION | CUTANEOUS | Status: DC | PRN

## 2023-01-06 MED ORDER — DIPHENHYDRAMINE HCL 50 MG/ML IJ SOLN
INTRAMUSCULAR | Status: AC
  Filled 2023-01-06: qty 1

## 2023-01-06 MED ORDER — PROPOFOL 10 MG/ML IV BOLUS
INTRAVENOUS | Status: AC
  Filled 2023-01-06: qty 20

## 2023-01-06 MED ORDER — HYDRALAZINE HCL 20 MG/ML IJ SOLN
5.0000 mg | INTRAMUSCULAR | Status: DC | PRN
  Administered 2023-01-06: 5 mg via INTRAVENOUS
  Filled 2023-01-06: qty 1

## 2023-01-06 MED ORDER — FENTANYL CITRATE PF 50 MCG/ML IJ SOSY
PREFILLED_SYRINGE | INTRAMUSCULAR | Status: AC
  Filled 2023-01-06: qty 2

## 2023-01-06 MED ORDER — LIDOCAINE HCL (PF) 2 % IJ SOLN
INTRAMUSCULAR | Status: AC
  Filled 2023-01-06: qty 5

## 2023-01-06 MED ORDER — MORPHINE SULFATE (PF) 2 MG/ML IV SOLN: 2.0000 mg | INTRAVENOUS | Status: DC | PRN

## 2023-01-06 MED ORDER — BUPIVACAINE-EPINEPHRINE (PF) 0.25% -1:200000 IJ SOLN
INTRAMUSCULAR | Status: DC | PRN
  Administered 2023-01-06: 10 mL via PERINEURAL

## 2023-01-06 MED ORDER — BUPIVACAINE-EPINEPHRINE 0.25% -1:200000 IJ SOLN
INTRAMUSCULAR | Status: AC
  Filled 2023-01-06: qty 1

## 2023-01-06 MED ORDER — MORPHINE SULFATE (PF) 2 MG/ML IV SOLN
2.0000 mg | INTRAVENOUS | Status: AC | PRN
  Administered 2023-01-06 – 2023-01-07 (×3): 2 mg via INTRAVENOUS
  Filled 2023-01-06 (×3): qty 1

## 2023-01-06 MED ORDER — ONDANSETRON HCL 4 MG/2ML IJ SOLN
INTRAMUSCULAR | Status: AC
  Filled 2023-01-06: qty 2

## 2023-01-06 MED ORDER — CEFAZOLIN SODIUM-DEXTROSE 2-4 GM/100ML-% IV SOLN
2.0000 g | Freq: Once | INTRAVENOUS | Status: AC
  Administered 2023-01-06: 2 g via INTRAVENOUS

## 2023-01-06 MED ORDER — LORATADINE 10 MG PO TABS: 10.0000 mg | ORAL_TABLET | Freq: Every day | ORAL | Status: DC | PRN

## 2023-01-06 MED ORDER — CHLORHEXIDINE GLUCONATE CLOTH 2 % EX PADS
6.0000 | MEDICATED_PAD | Freq: Every day | CUTANEOUS | Status: DC
  Administered 2023-01-06 – 2023-01-07 (×2): 6 via TOPICAL

## 2023-01-06 MED ORDER — ONDANSETRON HCL 4 MG/2ML IJ SOLN
INTRAMUSCULAR | Status: DC | PRN
  Administered 2023-01-06: 4 mg via INTRAVENOUS

## 2023-01-06 MED ORDER — DEXAMETHASONE SODIUM PHOSPHATE 10 MG/ML IJ SOLN
INTRAMUSCULAR | Status: AC
  Filled 2023-01-06: qty 1

## 2023-01-06 SURGICAL SUPPLY — 25 items
BAG COUNTER SPONGE SURGICOUNT (BAG) IMPLANT
BLADE SURG SZ11 CARB STEEL (BLADE) ×2 IMPLANT
CHLORAPREP W/TINT 26 (MISCELLANEOUS) ×2 IMPLANT
COVER SURGICAL LIGHT HANDLE (MISCELLANEOUS) ×2 IMPLANT
DERMABOND ADVANCED .7 DNX12 (GAUZE/BANDAGES/DRESSINGS) ×2 IMPLANT
DRAPE LAPAROSCOPIC ABDOMINAL (DRAPES) IMPLANT
DRAPE LAPAROTOMY TRNSV 102X78 (DRAPES) IMPLANT
ELECT REM PT RETURN 15FT ADLT (MISCELLANEOUS) ×2 IMPLANT
GAUZE PAD ABD 8X10 STRL (GAUZE/BANDAGES/DRESSINGS) IMPLANT
GAUZE SPONGE 4X4 12PLY STRL (GAUZE/BANDAGES/DRESSINGS) IMPLANT
GLOVE BIO SURGEON STRL SZ 6 (GLOVE) ×2 IMPLANT
GLOVE INDICATOR 6.5 STRL GRN (GLOVE) ×2 IMPLANT
GLOVE SS BIOGEL STRL SZ 6 (GLOVE) ×2 IMPLANT
GLOVE SURG SS PI 7.0 STRL IVOR (GLOVE) ×2 IMPLANT
GOWN STRL REUS W/ TWL LRG LVL3 (GOWN DISPOSABLE) ×2 IMPLANT
GOWN STRL REUS W/ TWL XL LVL3 (GOWN DISPOSABLE) IMPLANT
KIT BASIN OR (CUSTOM PROCEDURE TRAY) ×2 IMPLANT
KIT TURNOVER KIT A (KITS) IMPLANT
PACK GENERAL/GYN (CUSTOM PROCEDURE TRAY) ×2 IMPLANT
SPIKE FLUID TRANSFER (MISCELLANEOUS) IMPLANT
SURGILUBE 2OZ TUBE FLIPTOP (MISCELLANEOUS) IMPLANT
SUT MNCRL AB 4-0 PS2 18 (SUTURE) IMPLANT
SWAB COLLECTION DEVICE MRSA (MISCELLANEOUS) IMPLANT
SWAB CULTURE ESWAB REG 1ML (MISCELLANEOUS) IMPLANT
TOWEL OR 17X26 10 PK STRL BLUE (TOWEL DISPOSABLE) ×2 IMPLANT

## 2023-01-06 NOTE — Progress Notes (Signed)
 Pharmacy Antibiotic Note  Dawn [name] is a 23 y.o. female admitted on 01/01/2023 with DKA. During admission, patient noted to have left groin abscess with purulent drainage. She reports that it has been present for some time PTA but had increasing swelling and started draining while admitted. She is now s/p I&D of the area 1/3. Pharmacy has been consulted for vancomycin  dosing.  Plan: -Vancomycin  1 g IV x 1 followed by 500 mg IV q12h -Continue to follow renal function, cultures and clinical progress for dose adjustments and de-escalation as indicated  Height: 5' (152.4 cm) Weight: 48.5 kg (106 lb 14.8 oz) IBW/kg (Calculated) : 45.5  Temp (24hrs), Avg:97.9 F (36.6 C), Min:97.6 F (36.4 C), Max:98.2 F (36.8 C)  Recent Labs  Lab 01/02/23 0053 01/02/23 0233 01/03/23 0906 01/03/23 1144 01/04/23 0852 01/04/23 1354 01/05/23 0306 01/05/23 1513 01/06/23 0925  WBC 24.7*  --  14.7*  --   --   --  8.3  --  7.0  CREATININE  --    < > 0.61   < > 0.44 0.64 0.65 0.59 0.60   < > = values in this interval not displayed.    Estimated Creatinine Clearance: 79.9 mL/min (by C-G formula based on SCr of 0.6 mg/dL).    Allergies  Allergen Reactions   Latex Itching, Swelling and Other (See Comments)    Skin irritation   Tape Other (See Comments)    Skin irritation   Shellfish Allergy Itching and Rash    Scallops specifically    Antimicrobials this admission: Vancomycin  1/3 >>  Microbiology results: 12/30 MRSA PCR: positive  Thank you for allowing pharmacy to be a part of this patient's care.  Stefano MARLA Bologna, PharmD, BCPS Clinical Pharmacist 01/06/2023 10:39 AM

## 2023-01-06 NOTE — Anesthesia Postprocedure Evaluation (Signed)
 Anesthesia Post Note  Patient: Dawn [name]  Procedure(s) Performed: INCISION AND DRAINAGE ABSCESS (Left)     Patient location during evaluation: PACU Anesthesia Type: General Level of consciousness: awake and alert Pain management: pain level controlled Vital Signs Assessment: post-procedure vital signs reviewed and stable Respiratory status: spontaneous breathing, nonlabored ventilation, respiratory function stable and patient connected to nasal cannula oxygen Cardiovascular status: blood pressure returned to baseline and stable Postop Assessment: no apparent nausea or vomiting Anesthetic complications: no   No notable events documented.  Last Vitals:  Vitals:   01/06/23 1700 01/06/23 1806  BP: (!) 131/90 (!) 142/103  Pulse: 94 91  Resp: (!) 31 16  Temp:  37 C  SpO2: 100% 95%    Last Pain:  Vitals:   01/06/23 1806  TempSrc: Oral  PainSc:                  Windsor Goeken

## 2023-01-06 NOTE — Transfer of Care (Signed)
 Immediate Anesthesia Transfer of Care Note  Patient: Dawn [name]  Procedure(s) Performed: INCISION AND DRAINAGE ABSCESS (Left)  Patient Location: PACU  Anesthesia Type:General  Level of Consciousness: drowsy  Airway & Oxygen Therapy: Patient Spontanous Breathing and Patient connected to nasal cannula oxygen  Post-op Assessment: Report given to RN and Post -op Vital signs reviewed and stable  Post vital signs: Reviewed and stable  Last Vitals:  Vitals Value Taken Time  BP 135/95 01/06/23 0825  Temp    Pulse 78 01/06/23 0827  Resp 17 01/06/23 0827  SpO2 100 % 01/06/23 0827  Vitals shown include unfiled device data.  Last Pain:  Vitals:   01/06/23 0716  TempSrc:   PainSc: 8       Patients Stated Pain Goal: 0 (01/05/23 1004)  Complications: No notable events documented.

## 2023-01-06 NOTE — Op Note (Signed)
 Operative Note  Dawn [name]  [id number]  [id number]  01/06/2023   Surgeon: Mitzie Freund MD FACS   Procedure performed: Incision and debridement of left groin abscess   Preop diagnosis: Left groin abscess, DKA Post-op diagnosis/intraop findings: Same   Specimens: No Retained items: Packing in the wound which will need to be changed in 24 hours EBL: Minimal cc Complications: none   Description of procedure: After obtaining informed consent the patient was taken to the operating room and placed supine on operating room table where general anesthesia was initiated, preoperative antibiotics were administered, SCDs applied, and a formal timeout was performed.  The left groin was prepped and draped in usual sterile fashion.  An elliptical incision was made over the region of the abscess including the area of active drainage and cautery was used to dissect through the dermis and soft tissues to excise the anterior wall of the abscess and overlying skin.  The wound was gently bluntly probed and no additional loculations were encountered.  Although this was suspected to have been an infected sebaceous cyst, there was not an obvious cyst wall to excise.  The wound bed appeared healthy and viable and all purulent material had been debrided.  Hemostasis was ensured within the wound with cautery.  A field block with quarter percent Marcaine  with epinephrine  was performed.  The wound was then irrigated with warm sterile saline and gently packed with a 4 x 4 dampened with saline followed by additional dry 4 x 4's.  The patient was then awakened, extubated and taken to PACU in stable condition.    All counts were correct at the completion of the case.

## 2023-01-06 NOTE — Inpatient Diabetes Management (Signed)
 Inpatient Diabetes Program Recommendations  AACE/ADA: New Consensus Statement on Inpatient Glycemic Control (2015)  Target Ranges:  Prepandial:   less than 140 mg/dL      Peak postprandial:   less than 180 mg/dL (1-2 hours)      Critically ill patients:  140 - 180 mg/dL   Lab Results  Component Value Date   GLUCAP 168 (H) 01/06/2023   HGBA1C 9.6 (H) 12/16/2022    Review of Glycemic Control  Diabetes history: DM1 Outpatient Diabetes medications: Insulin  pump Current orders for Inpatient glycemic control: Semglee  25 daily, Novolog  0-9 units TID with meals + 4 units TID  HgbA1C - 9.6% In surgery for I&D this am.  Inpatient Diabetes Program Recommendations:    Spoke with pt at bedside. Ready to be discharged. Will start insulin  pump when home, wait 24H to restart basal rates. Pt voices understanding. Blood sugars likely elevated d/t infection. Pt to f/u with Endo.  Thank you. Shona Brandy, RD, LDN, CDCES Inpatient Diabetes Coordinator [phone]

## 2023-01-06 NOTE — TOC Initial Note (Signed)
 Transition of Care [facility]) - Initial/Assessment Note    Patient Details  Name: Dawn [name] MRN: [id number] Date of Birth: [birth year]  Transition of Care [facility]) CM/SW Contact:    NORMAN ASPEN, LCSW Phone Number: 01/06/2023, 3:10 PM  Clinical Narrative:                  Met with pt today to review for dc planning needs.  Pt very pleasant and talks openly with me about her living situation.  Notes that, just prior to admission, she was staying with a friend in a hotel room but does not plan to return there.  She states that her insurance [employer]) has paid for a short term hotel after previous hospital stay and she is going to contact them to assist her again.  She notes that the address on record is her aunt's home but she already has people staying there... I just get my mail there....  She has PCP at [facility] and able to get her medications.  She receives food stamps but has not yet received for this month.  She denies transportation needs, however, will include local resources for food/ housing/transportation on AVS.  TOC will continue to follow.    Expected Discharge Plan:  (hotel) Barriers to Discharge: Continued Medical Work up   Patient Goals and CMS Choice Patient states their goals for this hospitalization and ongoing recovery are:: secure hotel room through insurance          Expected Discharge Plan and Services In-house Referral: Clinical Social Work     Living arrangements for the past 2 months: Hotel/Motel                                      Prior Living Arrangements/Services Living arrangements for the past 2 months: Hotel/Motel Lives with:: Friends Patient language and need for interpreter reviewed:: Yes Do you feel safe going back to the place where you live?: Yes      Need for Family Participation in Patient Care: Yes (Comment) Care giver support system in place?: No (comment)   Criminal Activity/Legal Involvement Pertinent to Current  Situation/Hospitalization: No - Comment as needed  Activities of Daily Living   ADL Screening (condition at time of admission) Independently performs ADLs?: Yes (appropriate for developmental age) Is the patient deaf or have difficulty hearing?: No Does the patient have difficulty seeing, even when wearing glasses/contacts?: No Does the patient have difficulty concentrating, remembering, or making decisions?: No  Permission Sought/Granted   Permission granted to share information with : Yes, Verbal Permission Granted  Share Information with NAME: aunt, [name] Harness @ [phone]           Emotional Assessment Appearance:: Appears stated age Attitude/Demeanor/Rapport: Gracious Affect (typically observed): Accepting, Pleasant Orientation: : Oriented to Self, Oriented to Place, Oriented to  Time, Oriented to Situation Alcohol / Substance Use: Not Applicable Psych Involvement: No (comment)  Admission diagnosis:  DKA (diabetic ketoacidosis) (HCC) [E11.10] Type 1 diabetes mellitus with ketoacidosis without coma (HCC) [E10.10] Patient Active Problem List   Diagnosis Date Noted   DKA (diabetic ketoacidosis) (HCC) 12/25/2022   Pelvic pain 12/16/2022   Screening examination for STI 12/15/2022   Contraception management 12/15/2022   COVID-19 10/02/2022   Sinus tachycardia 08/12/2022   Chronic musculoskeletal pain 08/03/2022   Sepsis (HCC) 03/28/2022   Housing insecurity 08/09/2021   Menstrual periods irregular 06/17/2021   Diabetic  ketoacidosis without coma associated with type 1 diabetes mellitus (HCC) 04/10/2021   Tension headache, chronic 12/24/2019   Hx of migraines 07/11/2019   Protein-calorie malnutrition, severe 06/25/2019   Asthma 09/25/2018   Hyperglycemia    Noncompliance with diabetes treatment 07/04/2017   MDD (major depressive disorder), recurrent episode, moderate (HCC) 03/16/2016   Depression 03/24/2015   Maladaptive health behaviors affecting medical condition  03/24/2015   Poor social situation 07/20/2013   Poorly controlled type 1 diabetes mellitus (HCC) 07/07/2013   ADHD (attention deficit hyperactivity disorder) 12/09/2010   PCP:  Dartha Geralds, DO Pharmacy:   [facility] [phone] - [place], [place] - [address] AT [facility] OF [address] [place] [address] Phone: [phone] Fax: [phone]  [facility] [address] [place] [address] Phone: [phone] Fax: [phone]     Social Drivers of Health (SDOH) Social History: SDOH Screenings   Food Insecurity: Food Insecurity Present (01/02/2023)  Housing: High Risk (01/02/2023)  Transportation Needs: No Transportation Needs (01/02/2023)  Recent Concern: Transportation Needs - Unmet Transportation Needs (12/16/2022)  Utilities: Not At Risk (01/02/2023)  Alcohol Screen: Low Risk  (08/12/2022)  Depression (PHQ2-9): Medium Risk (10/31/2022)  Financial Resource Strain: High Risk (08/12/2022)  Physical Activity: Unknown (07/04/2018)  Social Connections: Moderately Isolated (08/12/2022)  Stress: Unknown (07/04/2018)  Tobacco Use: Medium Risk (01/04/2023)   SDOH Interventions:     Readmission Risk Interventions    01/03/2023   11:17 AM 03/07/2022   10:22 AM  Readmission Risk Prevention Plan  Transportation Screening Complete Complete  PCP or Specialist Appt within 5-7 Days Complete   Home Care Screening Complete   Medication Review (RN CM) Complete   Medication Review (RN Care Manager)  Complete  PCP or Specialist appointment within 3-5 days of discharge  Complete  HRI or Home Care Consult  Complete  SW Recovery Care/Counseling Consult  Complete  Palliative Care Screening  Not Applicable  Skilled Nursing Facility  Not Applicable

## 2023-01-06 NOTE — Anesthesia Procedure Notes (Signed)
 Procedure Name: LMA Insertion Date/Time: 01/06/2023 7:38 AM  Performed by: Therisa Doyal CROME, CRNAPatient Re-evaluated:Patient Re-evaluated prior to induction Oxygen Delivery Method: Circle system utilized Preoxygenation: Pre-oxygenation with 100% oxygen Induction Type: IV induction LMA: LMA inserted LMA Size: 3.0 Number of attempts: 1 Placement Confirmation: positive ETCO2 and breath sounds checked- equal and bilateral Tube secured with: Tape Dental Injury: Teeth and Oropharynx as per pre-operative assessment

## 2023-01-06 NOTE — Interval H&P Note (Signed)
 History and Physical Interval Note:  01/06/2023 7:13 AM  Dawn [name]  has presented today for surgery, with the diagnosis of LEFT GROIN ABCESS.  The various methods of treatment have been discussed with the patient and family. After consideration of risks, benefits and other options for treatment, the patient has consented to  Procedure(s): INCISION AND DRAINAGE ABSCESS (Left) as a surgical intervention.  The patient's history has been reviewed, patient examined, no change in status, stable for surgery.  I have reviewed the patient's chart and labs.  Questions were answered to the patient's satisfaction.     [name]

## 2023-01-06 NOTE — Progress Notes (Signed)
 Patient left with surgery team, off tele monitor per provider order.

## 2023-01-06 NOTE — Plan of Care (Signed)
  Problem: Education: Goal: Knowledge of General Education information will improve Description: Including pain rating scale, medication(s)/side effects and non-pharmacologic comfort measures Outcome: Progressing   Problem: Health Behavior/Discharge Planning: Goal: Ability to manage health-related needs will improve Outcome: Progressing   Problem: Clinical Measurements: Goal: Ability to maintain clinical measurements within normal limits will improve Outcome: Progressing Goal: Will remain free from infection Outcome: Progressing Goal: Diagnostic test results will improve Outcome: Progressing Goal: Respiratory complications will improve Outcome: Progressing Goal: Cardiovascular complication will be avoided Outcome: Progressing   Problem: Activity: Goal: Risk for activity intolerance will decrease Outcome: Progressing   Problem: Nutrition: Goal: Adequate nutrition will be maintained Outcome: Progressing   Problem: Coping: Goal: Level of anxiety will decrease Outcome: Progressing   Problem: Elimination: Goal: Will not experience complications related to bowel motility Outcome: Progressing Goal: Will not experience complications related to urinary retention Outcome: Progressing   Problem: Pain Management: Goal: General experience of comfort will improve Outcome: Progressing   Problem: Safety: Goal: Ability to remain free from injury will improve Outcome: Progressing   Problem: Skin Integrity: Goal: Risk for impaired skin integrity will decrease Outcome: Progressing   Problem: Education: Goal: Ability to describe self-care measures that may prevent or decrease complications (Diabetes Survival Skills Education) will improve Outcome: Progressing Goal: Individualized Educational Video(s) Outcome: Progressing   Problem: Coping: Goal: Ability to adjust to condition or change in health will improve Outcome: Progressing   Problem: Fluid Volume: Goal: Ability to  maintain a balanced intake and output will improve Outcome: Progressing   Problem: Health Behavior/Discharge Planning: Goal: Ability to identify and utilize available resources and services will improve Outcome: Progressing Goal: Ability to manage health-related needs will improve Outcome: Progressing   Problem: Metabolic: Goal: Ability to maintain appropriate glucose levels will improve Outcome: Progressing   Problem: Nutritional: Goal: Maintenance of adequate nutrition will improve Outcome: Progressing Goal: Progress toward achieving an optimal weight will improve Outcome: Progressing   Problem: Skin Integrity: Goal: Risk for impaired skin integrity will decrease Outcome: Progressing   Problem: Tissue Perfusion: Goal: Adequacy of tissue perfusion will improve Outcome: Progressing   Problem: Education: Goal: Ability to describe self-care measures that may prevent or decrease complications (Diabetes Survival Skills Education) will improve Outcome: Progressing Goal: Individualized Educational Video(s) Outcome: Progressing   Problem: Cardiac: Goal: Ability to maintain an adequate cardiac output will improve Outcome: Progressing   Problem: Health Behavior/Discharge Planning: Goal: Ability to identify and utilize available resources and services will improve Outcome: Progressing Goal: Ability to manage health-related needs will improve Outcome: Progressing   Problem: Fluid Volume: Goal: Ability to achieve a balanced intake and output will improve Outcome: Progressing   Problem: Metabolic: Goal: Ability to maintain appropriate glucose levels will improve Outcome: Progressing   Problem: Nutritional: Goal: Maintenance of adequate nutrition will improve Outcome: Progressing Goal: Maintenance of adequate weight for body size and type will improve Outcome: Progressing   Problem: Respiratory: Goal: Will regain and/or maintain adequate ventilation Outcome: Progressing    Problem: Urinary Elimination: Goal: Ability to achieve and maintain adequate renal perfusion and functioning will improve Outcome: Progressing

## 2023-01-06 NOTE — Progress Notes (Signed)
 PROGRESS NOTE    Dawn [name]  FMW:[id number] DOB: 2000-11-29 DOA: 01/01/2023 PCP: Dartha Geralds, DO   Brief Narrative:  23 years old female with PMH significant for type 1 diabetes on insulin  pump, multiple recent hospitalizations for DKA, most recent being on 12/25/2022, ADHD, asthma, iron  deficiency presented with uncontrolled blood glucose and was found to be in DKA.  She was admitted to ICU and started on insulin  drip and IV fluids.  Once anion gap closed, she was transitioned to long-acting insulin .  She was transferred to [facility] service from 01/04/2022 onwards.  General surgery consulted for possible left groin abscess.  Assessment & Plan:   DKA in a patient with diabetes mellitus type 1, uncontrolled -Has had multiple recent hospitalizations for DKA. -Initially treated with insulin  drip and IV fluids.  Subsequently transitioned to long-acting insulin . -Carb modified diet.  Continue long-acting insulin  along with short acting insulin  with meals and CBGs with SSI.  Diabetes coordinator consult pending.  Will need outpatient follow-up with endocrinology  Left groin abscess/infected sebaceous cyst -For surgical intervention today.  Start vancomycin .  Follow surgery recommendations  Leukocytosis Resolved  Chronic iron  deficiency anemia -Hemoglobin stable.  Monitor intermittently  Hypokalemia -Improved  Asthma -Stable.  Continue albuterol  as needed  ADHD -Resume home meds once able to tolerate orally   DVT prophylaxis: SCDs Code Status: Full Family Communication: None at bedside Disposition Plan: Status is: Inpatient Remains inpatient appropriate because: Of severity of illness  Consultants: PCCM/general surgery  Procedures: As above  Antimicrobials: Start vancomycin    Subjective: Patient seen and examined at bedside.  Complains of groin pain.  No fever, vomiting, seizures reported.  Objective: Vitals:   01/06/23 0830 01/06/23 0845 01/06/23 0900 01/06/23 0915   BP: (!) 135/111 (!) 135/93 (!) 137/94 (!) 146/101  Pulse: 76 73 79 92  Resp: 20 17 12 19   Temp:    97.7 F (36.5 C)  TempSrc:      SpO2: 100% 100% 100% 100%  Weight:      Height:        Intake/Output Summary (Last 24 hours) at 01/06/2023 1012 Last data filed at 01/06/2023 0827 Gross per 24 hour  Intake 660 ml  Output 5 ml  Net 655 ml   Filed Weights   01/04/23 0500 01/06/23 0500 01/06/23 0716  Weight: 52 kg 48.5 kg 48.5 kg    Examination:  General exam: Appears calm and comfortable.  On room air.  Chronically ill and deconditioned. Respiratory system: Bilateral decreased breath sounds at bases with scattered crackles Cardiovascular system: S1 & S2 heard, Rate controlled Gastrointestinal system: Abdomen is nondistended, soft and nontender. Normal bowel sounds heard. Extremities: No cyanosis, clubbing, edema  Central nervous system: Awake, slow to respond.  Poor historian.  No focal neurological deficits. Moving extremities Skin: Left groin mass, tender  psychiatry: Looks anxious intermittently.  Not agitated.  Data Reviewed: I have personally reviewed following labs and imaging studies  CBC: Recent Labs  Lab 01/02/23 0053 01/02/23 0233 01/03/23 0906 01/05/23 0306 01/06/23 0925  WBC 24.7*  --  14.7* 8.3 7.0  HGB 10.8* 13.3 9.3* 9.7* 9.4*  HCT 39.3 39.0 29.8* 31.0* 31.7*  MCV 90.6  --  82.5 81.6 85.2  PLT 597*  --  426* 412* 393   Basic Metabolic Panel: Recent Labs  Lab 01/04/23 0426 01/04/23 0852 01/04/23 1354 01/05/23 0306 01/05/23 1513  NA 136 138 137 132* 135  K 3.0* 3.9 4.3 3.9 4.1  CL 107 108 110 101 103  CO2 23 24 20* 22 25  GLUCOSE 93 101* 206* 336* 281*  BUN <5* <5* <5* 7 6  CREATININE 0.44 0.44 0.64 0.65 0.59  CALCIUM  8.3* 8.5* 8.9 9.0 9.0  MG  --   --  1.6* 2.4  --    GFR: Estimated Creatinine Clearance: 79.9 mL/min (by C-G formula based on SCr of 0.59 mg/dL). Liver Function Tests: Recent Labs  Lab 01/02/23 0539  AST 24  ALT 18   ALKPHOS 219*  BILITOT 1.4*  PROT 8.0  ALBUMIN 3.1*   No results for input(s): LIPASE, AMYLASE in the last 168 hours. No results for input(s): AMMONIA in the last 168 hours. Coagulation Profile: No results for input(s): INR, PROTIME in the last 168 hours. Cardiac Enzymes: No results for input(s): CKTOTAL, CKMB, CKMBINDEX, TROPONINI in the last 168 hours. BNP (last 3 results) No results for input(s): PROBNP in the last 8760 hours. HbA1C: No results for input(s): HGBA1C in the last 72 hours. CBG: Recent Labs  Lab 01/05/23 2226 01/06/23 0537 01/06/23 0659 01/06/23 0828 01/06/23 1008  GLUCAP 112* 176* 168* 216* 252*   Lipid Profile: No results for input(s): CHOL, HDL, LDLCALC, TRIG, CHOLHDL, LDLDIRECT in the last 72 hours. Thyroid  Function Tests: No results for input(s): TSH, T4TOTAL, FREET4, T3FREE, THYROIDAB in the last 72 hours. Anemia Panel: No results for input(s): VITAMINB12, FOLATE, FERRITIN, TIBC, IRON , RETICCTPCT in the last 72 hours. Sepsis Labs: No results for input(s): PROCALCITON, LATICACIDVEN in the last 168 hours.  Recent Results (from the past 240 hours)  Respiratory (~20 pathogens) panel by PCR     Status: None   Collection Time: 01/02/23  5:40 AM   Specimen: Nasopharyngeal Swab; Respiratory  Result Value Ref Range Status   Adenovirus NOT DETECTED NOT DETECTED Final   Coronavirus 229E NOT DETECTED NOT DETECTED Final    Comment: (NOTE) The Coronavirus on the Respiratory Panel, DOES NOT test for the novel  Coronavirus (2019 nCoV)    Coronavirus HKU1 NOT DETECTED NOT DETECTED Final   Coronavirus NL63 NOT DETECTED NOT DETECTED Final   Coronavirus OC43 NOT DETECTED NOT DETECTED Final   Metapneumovirus NOT DETECTED NOT DETECTED Final   Rhinovirus / Enterovirus NOT DETECTED NOT DETECTED Final   Influenza A NOT DETECTED NOT DETECTED Final   Influenza B NOT DETECTED NOT DETECTED Final   Parainfluenza  Virus 1 NOT DETECTED NOT DETECTED Final   Parainfluenza Virus 2 NOT DETECTED NOT DETECTED Final   Parainfluenza Virus 3 NOT DETECTED NOT DETECTED Final   Parainfluenza Virus 4 NOT DETECTED NOT DETECTED Final   Respiratory Syncytial Virus NOT DETECTED NOT DETECTED Final   Bordetella pertussis NOT DETECTED NOT DETECTED Final   Bordetella Parapertussis NOT DETECTED NOT DETECTED Final   Chlamydophila pneumoniae NOT DETECTED NOT DETECTED Final   Mycoplasma pneumoniae NOT DETECTED NOT DETECTED Final    Comment: Performed at [facility], [address] [place], [place] [address]  MRSA Next Gen by PCR, Nasal     Status: Abnormal   Collection Time: 01/02/23  8:10 AM   Specimen: Nasal Mucosa; Nasal Swab  Result Value Ref Range Status   MRSA by PCR Next Gen DETECTED (A) NOT DETECTED Final    Comment: (NOTE) The GeneXpert MRSA Assay (FDA approved for NASAL specimens only), is one component of a comprehensive MRSA colonization surveillance program. It is not intended to diagnose MRSA infection nor to guide or monitor treatment for MRSA infections. Test performance is not FDA approved in patients less  than 21 years old. Performed at [facility], [address] [place], [place] [address]          Radiology Studies: No results found.      Scheduled Meds:  Chlorhexidine  Gluconate Cloth  6 each Topical Daily   dextrose   25 g Intravenous STAT   diphenhydrAMINE        famotidine   20 mg Oral BID   fentaNYL        insulin  aspart  0-5 Units Subcutaneous QHS   insulin  aspart  0-9 Units Subcutaneous TID WC   insulin  aspart  4 Units Subcutaneous TID WC   insulin  glargine-yfgn  25 Units Subcutaneous Daily   lidocaine   1 patch Transdermal Q24H   mupirocin  ointment  1 Application Nasal BID   Continuous Infusions:        Sophie Mao, MD [facility] 01/06/2023, 10:12 AM

## 2023-01-06 NOTE — Discharge Instructions (Signed)
WOUND CARE: - midline dressing to be changed daily - supplies: sterile saline, gauze, scissors, tape  - remove dressing and all packing carefully, moistening with sterile saline as needed to avoid packing/internal dressing sticking to the wound. - clean edges of skin around the wound with water/gauze, making sure there is no tape debris or leakage left on skin that could cause skin irritation or breakdown. - dampen and clean gauze with sterile saline and pack wound from wound base to skin level, making sure to take note of any possible areas of wound tracking, tunneling and packing appropriately. Wound can be packed loosely. Trim gauze to size if a whole gauze is not required. - cover wound with a dry gauze and secure with tape.  - write the date/time on the dry dressing/tape to better track when the last dressing change occurred. - change dressing as needed if leakage occurs, wound gets contaminated, or patient requests to shower. - patient may shower daily with wound open (i.e. remove all packing) and following the shower the wound should be dried and a clean dressing placed.

## 2023-01-07 ENCOUNTER — Encounter (HOSPITAL_COMMUNITY): Payer: Self-pay | Admitting: Surgery

## 2023-01-07 DIAGNOSIS — E1010 Type 1 diabetes mellitus with ketoacidosis without coma: Secondary | ICD-10-CM | POA: Diagnosis not present

## 2023-01-07 LAB — CBC WITH DIFFERENTIAL/PLATELET
Abs Immature Granulocytes: 0.02 10*3/uL (ref 0.00–0.07)
Basophils Absolute: 0 10*3/uL (ref 0.0–0.1)
Basophils Relative: 0 %
Eosinophils Absolute: 0.2 10*3/uL (ref 0.0–0.5)
Eosinophils Relative: 2 %
HCT: 28.3 % — ABNORMAL LOW (ref 36.0–46.0)
Hemoglobin: 8.7 g/dL — ABNORMAL LOW (ref 12.0–15.0)
Immature Granulocytes: 0 %
Lymphocytes Relative: 59 %
Lymphs Abs: 4.4 10*3/uL — ABNORMAL HIGH (ref 0.7–4.0)
MCH: 25.4 pg — ABNORMAL LOW (ref 26.0–34.0)
MCHC: 30.7 g/dL (ref 30.0–36.0)
MCV: 82.5 fL (ref 80.0–100.0)
Monocytes Absolute: 0.6 10*3/uL (ref 0.1–1.0)
Monocytes Relative: 8 %
Neutro Abs: 2.3 10*3/uL (ref 1.7–7.7)
Neutrophils Relative %: 31 %
Platelets: 422 10*3/uL — ABNORMAL HIGH (ref 150–400)
RBC: 3.43 MIL/uL — ABNORMAL LOW (ref 3.87–5.11)
RDW: 16.8 % — ABNORMAL HIGH (ref 11.5–15.5)
WBC: 7.4 10*3/uL (ref 4.0–10.5)
nRBC: 0 % (ref 0.0–0.2)

## 2023-01-07 LAB — BASIC METABOLIC PANEL
Anion gap: 8 (ref 5–15)
BUN: 10 mg/dL (ref 6–20)
CO2: 26 mmol/L (ref 22–32)
Calcium: 8.8 mg/dL — ABNORMAL LOW (ref 8.9–10.3)
Chloride: 103 mmol/L (ref 98–111)
Creatinine, Ser: 0.54 mg/dL (ref 0.44–1.00)
GFR, Estimated: >60 mL/min (ref >60)
Glucose, Bld: 251 mg/dL — ABNORMAL HIGH (ref 70–99)
Potassium: 3.7 mmol/L (ref 3.5–5.1)
Sodium: 137 mmol/L (ref 135–145)

## 2023-01-07 LAB — GLUCOSE, CAPILLARY
Glucose-Capillary: 253 mg/dL — ABNORMAL HIGH (ref 70–99)
Glucose-Capillary: 158 mg/dL — ABNORMAL HIGH (ref 70–99)
Glucose-Capillary: 43 mg/dL — CL (ref 70–99)
Glucose-Capillary: 78 mg/dL (ref 70–99)
Glucose-Capillary: 153 mg/dL — ABNORMAL HIGH (ref 70–99)
Glucose-Capillary: 153 mg/dL — ABNORMAL HIGH (ref 70–99)

## 2023-01-07 LAB — MAGNESIUM: Magnesium: 1.8 mg/dL (ref 1.7–2.4)

## 2023-01-07 MED ORDER — OXYCODONE HCL 5 MG PO TABS
5.0000 mg | ORAL_TABLET | Freq: Once | ORAL | Status: AC | PRN
  Administered 2023-01-07: 5 mg via ORAL
  Filled 2023-01-07: qty 1

## 2023-01-07 MED ORDER — MORPHINE SULFATE (PF) 2 MG/ML IV SOLN
2.0000 mg | INTRAVENOUS | Status: AC | PRN
  Administered 2023-01-07 – 2023-01-08 (×3): 2 mg via INTRAVENOUS
  Filled 2023-01-07 (×4): qty 1

## 2023-01-07 MED ORDER — VANCOMYCIN HCL 500 MG IV SOLR
500.0000 mg | Freq: Two times a day (BID) | INTRAVENOUS | Status: DC
  Administered 2023-01-07 – 2023-01-08 (×3): 500 mg via INTRAVENOUS
  Filled 2023-01-07 (×4): qty 10

## 2023-01-07 MED ORDER — INSULIN GLARGINE-YFGN 100 UNIT/ML ~~LOC~~ SOLN
20.0000 [IU] | Freq: Every day | SUBCUTANEOUS | Status: DC
  Administered 2023-01-08: 20 [IU] via SUBCUTANEOUS
  Filled 2023-01-07: qty 0.2

## 2023-01-07 NOTE — Progress Notes (Signed)
 1 Day Post-Op   Subjective/Chief Complaint: C/o soreness at I and D site left groin   Objective: Vital signs in last 24 hours: Temp:  [97.6 F (36.4 C)-98.6 F (37 C)] 98.1 F (36.7 C) (01/04 0524) Pulse Rate:  [73-98] 79 (01/04 0524) Resp:  [12-31] 18 (01/04 0524) BP: (131-146)/(90-111) 134/92 (01/04 0524) SpO2:  [95 %-100 %] 100 % (01/04 0524) Last BM Date : 01/05/23  Intake/Output from previous day: 01/03 0701 - 01/04 0700 In: 600 [I.V.:200; IV Piggyback:400] Out: 5 [Blood:5] Intake/Output this shift: No intake/output data recorded.  Exam: Awake and alert Dressing with mild drainage to left groin  Lab Results:  Recent Labs    01/05/23 0306 01/06/23 0925  WBC 8.3 7.0  HGB 9.7* 9.4*  HCT 31.0* 31.7*  PLT 412* 393   BMET Recent Labs    01/05/23 1513 01/06/23 0925  NA 135 135  K 4.1 3.7  CL 103 102  CO2 25 25  GLUCOSE 281* 230*  BUN 6 11  CREATININE 0.59 0.60  CALCIUM  9.0 9.1   PT/INR No results for input(s): LABPROT, INR in the last 72 hours. ABG No results for input(s): PHART, HCO3 in the last 72 hours.  Invalid input(s): PCO2, PO2  Studies/Results: No results found.  Anti-infectives: Anti-infectives (From admission, onward)    Start     Dose/Rate Route Frequency Ordered Stop   01/07/23 0200  vancomycin  (VANCOCIN ) 500 mg in sodium chloride  0.9 % 100 mL IVPB        500 mg 100 mL/hr over 60 Minutes Intravenous Every 12 hours 01/07/23 0026     01/07/23 0000  vancomycin  (VANCOREADY) IVPB 500 mg/100 mL  Status:  Discontinued        500 mg 100 mL/hr over 60 Minutes Intravenous Every 12 hours 01/06/23 1036 01/07/23 0026   01/06/23 1600  ceFAZolin  (ANCEF ) IVPB 2g/100 mL premix        2 g 200 mL/hr over 30 Minutes Intravenous Every 8 hours 01/06/23 1205     01/06/23 1130  vancomycin  (VANCOCIN ) IVPB 1000 mg/200 mL premix        1,000 mg 200 mL/hr over 60 Minutes Intravenous  Once 01/06/23 1036 01/06/23 1314   01/06/23 0730  ceFAZolin   (ANCEF ) IVPB 2g/100 mL premix        2 g 200 mL/hr over 30 Minutes Intravenous  Once 01/06/23 0725 01/06/23 0753   01/06/23 0726  ceFAZolin  (ANCEF ) 2-4 GM/100ML-% IVPB       Note to Pharmacy: Therisa Lapine L: cabinet override      01/06/23 0726 01/06/23 0744       Assessment/Plan: s/p Procedure(s): INCISION AND DRAINAGE ABSCESS (Left)  Nursing to start wet to dry dressing changes today Continue antibiotics  LOS: 5 days    Vicenta Poli MD 01/07/2023

## 2023-01-07 NOTE — Progress Notes (Signed)
 PROGRESS NOTE    Dawn [name]  FMW:[id number] DOB: [birth year] DOA: 01/01/2023 PCP: Dartha Geralds, DO   Brief Narrative:  23 years old female with PMH significant for type 1 diabetes on insulin  pump, multiple recent hospitalizations for DKA, most recent being on 12/25/2022, ADHD, asthma, iron  deficiency presented with uncontrolled blood glucose and was found to be in DKA.  She was admitted to ICU and started on insulin  drip and IV fluids.  Once anion gap closed, she was transitioned to long-acting insulin .  She was transferred to TRH service from 01/04/2022 onwards.  General surgery consulted for possible left groin abscess.  She was started on IV antibiotic.  She underwent I&D on 01/05/2022 by general surgery.  Assessment & Plan:   DKA in a patient with diabetes mellitus type 1, uncontrolled -Has had multiple recent hospitalizations for DKA.  Uses insulin  pump at home. -Initially treated with insulin  drip and IV fluids.  Subsequently transitioned to long-acting insulin . -Carb modified diet.  Continue long-acting insulin  along with short acting insulin  with meals and CBGs with SSI.  Blood sugars improving but intermittently elevated.  Diabetes coordinator following.  Will need outpatient follow-up with endocrinology  Left groin abscess/infected sebaceous cyst -Status post I&D on 01/06/2023.  Continue IV antibiotics.  Follow general surgery recommendations.  Wound care/dressing changes as per general surgery.  Leukocytosis -Resolved  Elevated blood pressure--titration intermittently elevated.  If continues to remain elevated, will start scheduled antihypertensives  Chronic iron  deficiency anemia -Hemoglobin stable.  Monitor intermittently  Hypokalemia -Improved  Asthma -Stable.  Continue albuterol  as needed  ADHD -Outpatient follow-up with PCP/or psychiatry   DVT prophylaxis: SCDs Code Status: Full Family Communication: None at bedside Disposition Plan: Status is:  Inpatient Remains inpatient appropriate because: Of severity of illness  Consultants: PCCM/general surgery  Procedures: As above  Antimicrobials:  Anti-infectives (From admission, onward)    Start     Dose/Rate Route Frequency Ordered Stop   01/07/23 0200  vancomycin  (VANCOCIN ) 500 mg in sodium chloride  0.9 % 100 mL IVPB        500 mg 100 mL/hr over 60 Minutes Intravenous Every 12 hours 01/07/23 0026     01/07/23 0000  vancomycin  (VANCOREADY) IVPB 500 mg/100 mL  Status:  Discontinued        500 mg 100 mL/hr over 60 Minutes Intravenous Every 12 hours 01/06/23 1036 01/07/23 0026   01/06/23 1600  ceFAZolin  (ANCEF ) IVPB 2g/100 mL premix        2 g 200 mL/hr over 30 Minutes Intravenous Every 8 hours 01/06/23 1205     01/06/23 1130  vancomycin  (VANCOCIN ) IVPB 1000 mg/200 mL premix        1,000 mg 200 mL/hr over 60 Minutes Intravenous  Once 01/06/23 1036 01/06/23 1314   01/06/23 0730  ceFAZolin  (ANCEF ) IVPB 2g/100 mL premix        2 g 200 mL/hr over 30 Minutes Intravenous  Once 01/06/23 0725 01/06/23 0753   01/06/23 0726  ceFAZolin  (ANCEF ) 2-4 GM/100ML-% IVPB       Note to Pharmacy: Therisa Lapine L: cabinet override      01/06/23 0726 01/06/23 0744         Subjective: Patient seen and examined at bedside.  No agitation, fever or vomiting reported.  Continues to complain of groin pain. Objective: Vitals:   01/06/23 1700 01/06/23 1806 01/06/23 1945 01/07/23 0524  BP: (!) 131/90 (!) 142/103 (!) 134/97 (!) 134/92  Pulse: 94 91 98 79  Resp: (!) 31 16  18 18  Temp:  98.6 F (37 C) 98.5 F (36.9 C) 98.1 F (36.7 C)  TempSrc:  Oral Oral Oral  SpO2: 100% 95% 100% 100%  Weight:      Height:        Intake/Output Summary (Last 24 hours) at 01/07/2023 0834 Last data filed at 01/07/2023 0325 Gross per 24 hour  Intake 300 ml  Output --  Net 300 ml   Filed Weights   01/04/23 0500 01/06/23 0500 01/06/23 0716  Weight: 52 kg 48.5 kg 48.5 kg    Examination:  General: Currently on  room air.  No distress ENT/neck: No thyromegaly.  JVD is not elevated  respiratory: Decreased breath sounds at bases bilaterally with some crackles; no wheezing  CVS: S1-S2 heard, rate controlled currently Abdominal: Soft, nontender, slightly distended; no organomegaly, bowel sounds are heard Extremities: Trace lower extremity edema; no cyanosis  CNS: Awake and alert.  Still slow to respond.  Poor historian.  No focal neurologic deficit.  Moves extremities Lymph: No obvious lymphadenopathy Skin: Left groin dressing present.   Psych: Looks anxious.  Not agitated currently.   Musculoskeletal: No obvious joint swelling/deformity   Data Reviewed: I have personally reviewed following labs and imaging studies  CBC: Recent Labs  Lab 01/02/23 0053 01/02/23 0233 01/03/23 0906 01/05/23 0306 01/06/23 0925 01/07/23 0744  WBC 24.7*  --  14.7* 8.3 7.0 7.4  NEUTROABS  --   --   --   --   --  2.3  HGB 10.8* 13.3 9.3* 9.7* 9.4* 8.7*  HCT 39.3 39.0 29.8* 31.0* 31.7* 28.3*  MCV 90.6  --  82.5 81.6 85.2 82.5  PLT 597*  --  426* 412* 393 422*   Basic Metabolic Panel: Recent Labs  Lab 01/04/23 0852 01/04/23 1354 01/05/23 0306 01/05/23 1513 01/06/23 0925  NA 138 137 132* 135 135  K 3.9 4.3 3.9 4.1 3.7  CL 108 110 101 103 102  CO2 24 20* 22 25 25   GLUCOSE 101* 206* 336* 281* 230*  BUN <5* <5* 7 6 11   CREATININE 0.44 0.64 0.65 0.59 0.60  CALCIUM  8.5* 8.9 9.0 9.0 9.1  MG  --  1.6* 2.4  --  1.8  PHOS  --   --   --   --  3.2   GFR: Estimated Creatinine Clearance: 79.9 mL/min (by C-G formula based on SCr of 0.6 mg/dL). Liver Function Tests: Recent Labs  Lab 01/02/23 0539  AST 24  ALT 18  ALKPHOS 219*  BILITOT 1.4*  PROT 8.0  ALBUMIN 3.1*   No results for input(s): LIPASE, AMYLASE in the last 168 hours. No results for input(s): AMMONIA in the last 168 hours. Coagulation Profile: No results for input(s): INR, PROTIME in the last 168 hours. Cardiac Enzymes: No results  for input(s): CKTOTAL, CKMB, CKMBINDEX, TROPONINI in the last 168 hours. BNP (last 3 results) No results for input(s): PROBNP in the last 8760 hours. HbA1C: No results for input(s): HGBA1C in the last 72 hours. CBG: Recent Labs  Lab 01/06/23 1129 01/06/23 1533 01/06/23 1717 01/06/23 2210 01/07/23 0726  GLUCAP 213* 190* 143* 147* 253*   Lipid Profile: No results for input(s): CHOL, HDL, LDLCALC, TRIG, CHOLHDL, LDLDIRECT in the last 72 hours. Thyroid  Function Tests: No results for input(s): TSH, T4TOTAL, FREET4, T3FREE, THYROIDAB in the last 72 hours. Anemia Panel: No results for input(s): VITAMINB12, FOLATE, FERRITIN, TIBC, IRON , RETICCTPCT in the last 72 hours. Sepsis Labs: No results for input(s): PROCALCITON, LATICACIDVEN in  the last 168 hours.  Recent Results (from the past 240 hours)  Respiratory (~20 pathogens) panel by PCR     Status: None   Collection Time: 01/02/23  5:40 AM   Specimen: Nasopharyngeal Swab; Respiratory  Result Value Ref Range Status   Adenovirus NOT DETECTED NOT DETECTED Final   Coronavirus 229E NOT DETECTED NOT DETECTED Final    Comment: (NOTE) The Coronavirus on the Respiratory Panel, DOES NOT test for the novel  Coronavirus (2019 nCoV)    Coronavirus HKU1 NOT DETECTED NOT DETECTED Final   Coronavirus NL63 NOT DETECTED NOT DETECTED Final   Coronavirus OC43 NOT DETECTED NOT DETECTED Final   Metapneumovirus NOT DETECTED NOT DETECTED Final   Rhinovirus / Enterovirus NOT DETECTED NOT DETECTED Final   Influenza A NOT DETECTED NOT DETECTED Final   Influenza B NOT DETECTED NOT DETECTED Final   Parainfluenza Virus 1 NOT DETECTED NOT DETECTED Final   Parainfluenza Virus 2 NOT DETECTED NOT DETECTED Final   Parainfluenza Virus 3 NOT DETECTED NOT DETECTED Final   Parainfluenza Virus 4 NOT DETECTED NOT DETECTED Final   Respiratory Syncytial Virus NOT DETECTED NOT DETECTED Final   Bordetella pertussis NOT  DETECTED NOT DETECTED Final   Bordetella Parapertussis NOT DETECTED NOT DETECTED Final   Chlamydophila pneumoniae NOT DETECTED NOT DETECTED Final   Mycoplasma pneumoniae NOT DETECTED NOT DETECTED Final    Comment: Performed at [facility], [address] [place], [place] [address]  MRSA Next Gen by PCR, Nasal     Status: Abnormal   Collection Time: 01/02/23  8:10 AM   Specimen: Nasal Mucosa; Nasal Swab  Result Value Ref Range Status   MRSA by PCR Next Gen DETECTED (A) NOT DETECTED Final    Comment: (NOTE) The GeneXpert MRSA Assay (FDA approved for NASAL specimens only), is one component of a comprehensive MRSA colonization surveillance program. It is not intended to diagnose MRSA infection nor to guide or monitor treatment for MRSA infections. Test performance is not FDA approved in patients less than 62 years old. Performed at [facility], [address], [place], [place] [address]          Radiology Studies: No results found.      Scheduled Meds:  Chlorhexidine  Gluconate Cloth  6 each Topical Daily   famotidine   20 mg Oral BID   insulin  aspart  0-5 Units Subcutaneous QHS   insulin  aspart  0-9 Units Subcutaneous TID WC   insulin  aspart  4 Units Subcutaneous TID WC   insulin  glargine-yfgn  25 Units Subcutaneous Daily   lidocaine   1 patch Transdermal Q24H   Continuous Infusions:   ceFAZolin  (ANCEF ) IV 2 g (01/07/23 0016)   vancomycin  (VANCOCIN ) 500 mg in sodium chloride  0.9 % 100 mL IVPB 500 mg (01/07/23 0148)          Sophie Mao, MD [facility] 01/07/2023, 8:34 AM

## 2023-01-07 NOTE — Progress Notes (Signed)
 Hypoglycemic Event  CBG: 43  Treatment: 8 oz juice/soda  Symptoms: Shaky  Follow-up CBG: Time:1350 CBG Result:78  Possible Reasons for Event: Unknown  Comments/MD notified:Alekh     Eriberto Felch E Ziyanna Tolin

## 2023-01-08 DIAGNOSIS — E1010 Type 1 diabetes mellitus with ketoacidosis without coma: Secondary | ICD-10-CM | POA: Diagnosis not present

## 2023-01-08 LAB — CBC WITH DIFFERENTIAL/PLATELET
Abs Immature Granulocytes: 0.03 10*3/uL (ref 0.00–0.07)
Basophils Absolute: 0 10*3/uL (ref 0.0–0.1)
Basophils Relative: 0 %
Eosinophils Absolute: 0.2 10*3/uL (ref 0.0–0.5)
Eosinophils Relative: 2 %
HCT: 28.6 % — ABNORMAL LOW (ref 36.0–46.0)
Hemoglobin: 8.5 g/dL — ABNORMAL LOW (ref 12.0–15.0)
Immature Granulocytes: 0 %
Lymphocytes Relative: 48 %
Lymphs Abs: 4.1 10*3/uL — ABNORMAL HIGH (ref 0.7–4.0)
MCH: 24.8 pg — ABNORMAL LOW (ref 26.0–34.0)
MCHC: 29.7 g/dL — ABNORMAL LOW (ref 30.0–36.0)
MCV: 83.4 fL (ref 80.0–100.0)
Monocytes Absolute: 0.6 10*3/uL (ref 0.1–1.0)
Monocytes Relative: 7 %
Neutro Abs: 3.6 10*3/uL (ref 1.7–7.7)
Neutrophils Relative %: 43 %
Platelets: 395 10*3/uL (ref 150–400)
RBC: 3.43 MIL/uL — ABNORMAL LOW (ref 3.87–5.11)
RDW: 17.3 % — ABNORMAL HIGH (ref 11.5–15.5)
WBC: 8.6 10*3/uL (ref 4.0–10.5)
nRBC: 0 % (ref 0.0–0.2)

## 2023-01-08 LAB — BASIC METABOLIC PANEL
Anion gap: 6 (ref 5–15)
BUN: 9 mg/dL (ref 6–20)
CO2: 27 mmol/L (ref 22–32)
Calcium: 8.6 mg/dL — ABNORMAL LOW (ref 8.9–10.3)
Chloride: 104 mmol/L (ref 98–111)
Creatinine, Ser: 0.47 mg/dL (ref 0.44–1.00)
GFR, Estimated: >60 mL/min (ref >60)
Glucose, Bld: 204 mg/dL — ABNORMAL HIGH (ref 70–99)
Potassium: 3.4 mmol/L — ABNORMAL LOW (ref 3.5–5.1)
Sodium: 137 mmol/L (ref 135–145)

## 2023-01-08 LAB — C-REACTIVE PROTEIN: CRP: 0.6 mg/dL (ref <1.0)

## 2023-01-08 LAB — GLUCOSE, CAPILLARY
Glucose-Capillary: 187 mg/dL — ABNORMAL HIGH (ref 70–99)
Glucose-Capillary: 99 mg/dL (ref 70–99)

## 2023-01-08 LAB — MAGNESIUM: Magnesium: 1.7 mg/dL (ref 1.7–2.4)

## 2023-01-08 MED ORDER — TRAMADOL HCL 50 MG PO TABS: 50.0000 mg | ORAL_TABLET | Freq: Four times a day (QID) | ORAL | 0 refills | Status: DC | PRN

## 2023-01-08 MED ORDER — DOXYCYCLINE HYCLATE 100 MG PO TABS: 100.0000 mg | ORAL_TABLET | Freq: Two times a day (BID) | ORAL | 0 refills | Status: AC

## 2023-01-08 NOTE — Plan of Care (Signed)
  Problem: Education: Goal: Knowledge of General Education information will improve Description: Including pain rating scale, medication(s)/side effects and non-pharmacologic comfort measures Outcome: Progressing   Problem: Health Behavior/Discharge Planning: Goal: Ability to manage health-related needs will improve Outcome: Progressing   Problem: Clinical Measurements: Goal: Ability to maintain clinical measurements within normal limits will improve Outcome: Progressing Goal: Will remain free from infection Outcome: Progressing Goal: Diagnostic test results will improve Outcome: Progressing Goal: Respiratory complications will improve Outcome: Progressing Goal: Cardiovascular complication will be avoided Outcome: Progressing   Problem: Activity: Goal: Risk for activity intolerance will decrease Outcome: Progressing   Problem: Nutrition: Goal: Adequate nutrition will be maintained Outcome: Progressing   Problem: Coping: Goal: Level of anxiety will decrease Outcome: Progressing   Problem: Elimination: Goal: Will not experience complications related to bowel motility Outcome: Progressing Goal: Will not experience complications related to urinary retention Outcome: Progressing   Problem: Pain Management: Goal: General experience of comfort will improve Outcome: Progressing   Problem: Safety: Goal: Ability to remain free from injury will improve Outcome: Progressing   Problem: Skin Integrity: Goal: Risk for impaired skin integrity will decrease Outcome: Progressing   Problem: Education: Goal: Ability to describe self-care measures that may prevent or decrease complications (Diabetes Survival Skills Education) will improve Outcome: Progressing Goal: Individualized Educational Video(s) Outcome: Progressing   Problem: Coping: Goal: Ability to adjust to condition or change in health will improve Outcome: Progressing   Problem: Fluid Volume: Goal: Ability to  maintain a balanced intake and output will improve Outcome: Progressing   Problem: Health Behavior/Discharge Planning: Goal: Ability to identify and utilize available resources and services will improve Outcome: Progressing Goal: Ability to manage health-related needs will improve Outcome: Progressing   Problem: Metabolic: Goal: Ability to maintain appropriate glucose levels will improve Outcome: Progressing   Problem: Nutritional: Goal: Maintenance of adequate nutrition will improve Outcome: Progressing Goal: Progress toward achieving an optimal weight will improve Outcome: Progressing   Problem: Skin Integrity: Goal: Risk for impaired skin integrity will decrease Outcome: Progressing   Problem: Tissue Perfusion: Goal: Adequacy of tissue perfusion will improve Outcome: Progressing   Problem: Education: Goal: Ability to describe self-care measures that may prevent or decrease complications (Diabetes Survival Skills Education) will improve Outcome: Progressing Goal: Individualized Educational Video(s) Outcome: Progressing   Problem: Cardiac: Goal: Ability to maintain an adequate cardiac output will improve Outcome: Progressing   Problem: Health Behavior/Discharge Planning: Goal: Ability to identify and utilize available resources and services will improve Outcome: Progressing Goal: Ability to manage health-related needs will improve Outcome: Progressing   Problem: Fluid Volume: Goal: Ability to achieve a balanced intake and output will improve Outcome: Progressing   Problem: Metabolic: Goal: Ability to maintain appropriate glucose levels will improve Outcome: Progressing   Problem: Nutritional: Goal: Maintenance of adequate nutrition will improve Outcome: Progressing Goal: Maintenance of adequate weight for body size and type will improve Outcome: Progressing   Problem: Respiratory: Goal: Will regain and/or maintain adequate ventilation Outcome: Progressing    Problem: Urinary Elimination: Goal: Ability to achieve and maintain adequate renal perfusion and functioning will improve Outcome: Progressing

## 2023-01-08 NOTE — Progress Notes (Signed)
 Wet to dry dressing changes taught to patient. Supplies also sent home with patient. No pain medications ordered at DC, patient requesting something to be added. MD Gross made aware. Awaiting response at this time for DC.

## 2023-01-08 NOTE — Progress Notes (Signed)
 2 Days Post-Op   Subjective/Chief Complaint: Did well with dressing changes   Objective: Vital signs in last 24 hours: Temp:  [98.1 F (36.7 C)-98.4 F (36.9 C)] 98.1 F (36.7 C) (01/05 0442) Pulse Rate:  [85-99] 85 (01/05 0442) Resp:  [16-18] 18 (01/05 0442) BP: (113-138)/(78-82) 113/78 (01/05 0442) SpO2:  [99 %-100 %] 100 % (01/05 0442) Weight:  [51 kg] 51 kg (01/04 0711) Last BM Date : 01/05/23 (per patient)  Intake/Output from previous day: 01/04 0701 - 01/05 0700 In: 200 [IV Piggyback:200] Out: -  Intake/Output this shift: No intake/output data recorded.  Exam: Awake and alert Comfortable Left groin wound clean  Lab Results:  Recent Labs    01/06/23 0925 01/07/23 0744  WBC 7.0 7.4  HGB 9.4* 8.7*  HCT 31.7* 28.3*  PLT 393 422*   BMET Recent Labs    01/06/23 0925 01/07/23 0744  NA 135 137  K 3.7 3.7  CL 102 103  CO2 25 26  GLUCOSE 230* 251*  BUN 11 10  CREATININE 0.60 0.54  CALCIUM  9.1 8.8*   PT/INR No results for input(s): LABPROT, INR in the last 72 hours. ABG No results for input(s): PHART, HCO3 in the last 72 hours.  Invalid input(s): PCO2, PO2  Studies/Results: No results found.  Anti-infectives: Anti-infectives (From admission, onward)    Start     Dose/Rate Route Frequency Ordered Stop   01/07/23 0200  vancomycin  (VANCOCIN ) 500 mg in sodium chloride  0.9 % 100 mL IVPB        500 mg 100 mL/hr over 60 Minutes Intravenous Every 12 hours 01/07/23 0026     01/07/23 0000  vancomycin  (VANCOREADY) IVPB 500 mg/100 mL  Status:  Discontinued        500 mg 100 mL/hr over 60 Minutes Intravenous Every 12 hours 01/06/23 1036 01/07/23 0026   01/06/23 1600  ceFAZolin  (ANCEF ) IVPB 2g/100 mL premix        2 g 200 mL/hr over 30 Minutes Intravenous Every 8 hours 01/06/23 1205     01/06/23 1130  vancomycin  (VANCOCIN ) IVPB 1000 mg/200 mL premix        1,000 mg 200 mL/hr over 60 Minutes Intravenous  Once 01/06/23 1036 01/06/23 1314    01/06/23 0730  ceFAZolin  (ANCEF ) IVPB 2g/100 mL premix        2 g 200 mL/hr over 30 Minutes Intravenous  Once 01/06/23 0725 01/06/23 0753   01/06/23 0726  ceFAZolin  (ANCEF ) 2-4 GM/100ML-% IVPB       Note to Pharmacy: Therisa Lapine L: cabinet override      01/06/23 0726 01/06/23 0744       Assessment/Plan: s/p Procedure(s): INCISION AND DRAINAGE ABSCESS (Left)  -doing well with dressing changes -ok for discharge home from surgery standpoint   LOS: 6 days    Vicenta Poli MD 01/08/2023

## 2023-01-08 NOTE — Discharge Summary (Signed)
 Physician Discharge Summary  Dawn [name] FMW:[id number] DOB: August 07, 2000 DOA: 01/01/2023  PCP: Dameron, Marisa, DO  Admit date: 01/01/2023 Discharge date: 01/08/2023  Admitted From: Home Disposition: Home  Recommendations for Outpatient Follow-up:  Follow up with PCP in 1 week with repeat CBC/BMP Outpatient follow-up with general surgery.  Discharge wound care/pain management as per general surgery Outpatient follow-up with endocrinology Comply with medications and follow-up Follow up in ED if symptoms worsen or new appear   Home Health: No Equipment/Devices: None  Discharge Condition: Stable CODE STATUS: Full Diet recommendation: Carb modified  Brief/Interim Summary: 23 years old female with PMH significant for type 1 diabetes on insulin  pump, multiple recent hospitalizations for DKA, most recent being on 12/25/2022, ADHD, asthma, iron  deficiency presented with uncontrolled blood glucose and was found to be in DKA.  She was admitted to ICU and started on insulin  drip and IV fluids.  Once anion gap closed, she was transitioned to long-acting insulin .  She was transferred to TRH service from 01/04/2022 onwards.  General surgery consulted for possible left groin abscess.  She was started on IV antibiotic.  She underwent I&D on 01/05/2022 by general surgery.  Subsequently, condition has improved.  General surgery has cleared the patient for discharge.  Discharge patient home today with outpatient follow-up with PCP and neurosurgery.    Discharge Diagnoses:   DKA in a patient with diabetes mellitus type 1, uncontrolled -Has had multiple recent hospitalizations for DKA.  Uses insulin  pump at home. -Initially treated with insulin  drip and IV fluids.  Subsequently transitioned to long-acting insulin . -Carb modified diet.  Continue home regimen.  Will need outpatient follow-up with endocrinology -Currently tolerating diet and feels okay to go home.  Discharge patient home today.   Left groin  abscess/infected sebaceous cyst -Status post I&D on 01/06/2023.  Treated with IV antibiotics.  General surgery has cleared the patient for discharge home today.  Wound care/dressing changes as per general surgery. -Discharge home today on oral doxycycline  for 5 more days   Leukocytosis -Resolved   Elevated blood pressure--blood pressures only intermittently elevated.  Outpatient follow-up with PCP  Chronic iron  deficiency anemia -Hemoglobin stable.  Monitor intermittently as an outpatient   Hypokalemia -Mild.  Outpatient follow-up.   Asthma -Stable.  Continue albuterol  as needed   ADHD -Outpatient follow-up with PCP/or psychiatry   Discharge Instructions  Discharge Instructions     Diet Carb Modified   Complete by: As directed    Discharge wound care:   Complete by: As directed    As per general surgery recommendations   Increase activity slowly   Complete by: As directed       Allergies as of 01/08/2023       Reactions   Latex Itching, Swelling, Other (See Comments)   Skin irritation   Tape Other (See Comments)   Skin irritation   Shellfish Allergy Itching, Rash   Scallops specifically        Medication List     TAKE these medications    Baqsimi  Two Pack 3 MG/DOSE Powd Generic drug: Glucagon  Place 3 mg into the nose as needed (low blood sugar).   Dexcom G7 Sensor Misc Inject 1 Application into the skin as directed.   doxycycline  100 MG tablet Commonly known as: VIBRA -TABS Take 1 tablet (100 mg total) by mouth 2 (two) times daily for 5 days.   escitalopram  20 MG tablet Commonly known as: LEXAPRO  Take 20 mg by mouth daily as needed.   ferrous sulfate  324  MG Tbec Take 1 tablet (324 mg total) by mouth every other day.   ibuprofen  800 MG tablet Commonly known as: ADVIL  Take 800 mg by mouth every 6 (six) hours as needed.   Insulin  Aspart FlexPen 100 UNIT/ML Commonly known as: NOVOLOG  Inject 0-50 Units into the skin See admin instructions. INJECT 1  UNIT FOR EVERY 20G OF CARBS. PLUS 1 UNIT FOR EVERY 50 ABOVE 150 MG/DL PREMEALS. MAX DOSAGE 50U/DAY What changed: additional instructions   insulin  degludec 100 UNIT/ML FlexTouch Pen Commonly known as: [name]  Inject 30 Units into the skin daily. Per your Endocrinologist, increase by 2 units every 3 days until fasting BG is 200mg /dL or less   medroxyPROGESTERone  150 MG/ML injection Commonly known as: DEPO-PROVERA  Inject 150 mg into the muscle every 3 (three) months.   norgestimate -ethinyl estradiol  0.25-35 MG-MCG tablet Commonly known as: Sprintec 28 Take 1 tablet by mouth daily.   SUMAtriptan  25 MG tablet Commonly known as: Imitrex  Take 0.5 tablets (12.5 mg total) by mouth every 2 (two) hours as needed for migraine. May repeat in 2 hours if headache persists or recurs.   Vitamin D  (Ergocalciferol ) 1.25 MG (50000 UNIT) Caps capsule Commonly known as: DRISDOL  Take 50,000 Units by mouth every 7 (seven) days.               Discharge Care Instructions  (From admission, onward)           Start     Ordered   01/08/23 0000  Discharge wound care:       Comments: As per general surgery recommendations   01/08/23 1044            Follow-up Information     Maczis, Puja Gosai, PA-C Follow up in 3 week(s).   Specialty: General Surgery Why: Office will call you with a follow up appointment, If you don't hear from the office, please call, Arrive 30 minutes prior to your appointment time, Please bring your insurance card and photo ID Contact information: [address] [facility] [place] [address] [facility] [place] [address] [phone]         Dartha Geralds, DO. Schedule an appointment as soon as possible for a visit in 1 week(s).   Specialty: Family Medicine Contact information: [address] [place] [address] [phone]                Allergies  Allergen Reactions   Latex Itching, Swelling and Other (See Comments)    Skin irritation    Tape Other (See Comments)    Skin irritation   Shellfish Allergy Itching and Rash    Scallops specifically    Consultations: PCCM/general surgery   Procedures/Studies: DG Chest Port 1 View Result Date: 01/02/2023 CLINICAL DATA:  23 year old female diabetic ketoacidosis. Weakness, nausea vomiting, shortness of breath. EXAM: PORTABLE CHEST 1 VIEW COMPARISON:  Portable chest 12/25/2022 and earlier. FINDINGS: Portable AP supine view at 0434 hours. Rotated to the right now. Lung volumes and mediastinal contours are stable and within normal limits. Visualized tracheal air column is within normal limits. Allowing for portable technique the lungs are clear. No evidence of pneumothorax or pleural effusion on this supine view. Increased bowel gas in the left upper quadrant, not definitely abnormal. No osseous abnormality identified. IMPRESSION: Rotated view with no cardiopulmonary abnormality. Electronically Signed   By: VEAR Hurst M.D.   On: 01/02/2023 05:07   DG Chest Portable 1 View Result Date: 12/25/2022 CLINICAL DATA:  Cough, nausea, vomiting and body aches  since last evening. EXAM: PORTABLE CHEST 1 VIEW COMPARISON:  12/16/2022 FINDINGS: The cardiac silhouette, mediastinal and hilar contours are normal. The lungs are clear. The bony thorax is intact. IMPRESSION: No acute cardiopulmonary findings. Electronically Signed   By: MYRTIS Stammer M.D.   On: 12/25/2022 10:07   US  PELVIS (TRANSABDOMINAL ONLY) Result Date: 12/16/2022 CLINICAL DATA:  Several month history of pelvic pain EXAM: TRANSABDOMINAL ULTRASOUND OF PELVIS TECHNIQUE: Transabdominal ultrasound examination of the pelvis was performed including evaluation of the uterus, ovaries, adnexal regions, and pelvic cul-de-sac. COMPARISON:  None Available. FINDINGS: Uterus Measurements: 6.9 cm in sagittal dimension. No fibroids or other mass visualized. Endometrium Thickness: 7 mm.  No focal abnormality visualized. Right ovary Not seen. Left ovary Not  seen. Other findings:  No abnormal free fluid. IMPRESSION: 1. Technically challenging examination due to bowel gas. 2. Normal-appearing uterus and endometrium. 3. Nonvisualization of the ovaries. Electronically Signed   By: Limin  Xu M.D.   On: 12/16/2022 16:20   DG Chest Portable 1 View Result Date: 12/16/2022 CLINICAL DATA:  Hyperglycemia EXAM: PORTABLE CHEST 1 VIEW COMPARISON:  10/01/2022 FINDINGS: Lungs are clear.  No pleural effusion or pneumothorax. The heart is normal in size. IMPRESSION: No acute cardiopulmonary disease. Electronically Signed   By: Pinkie Pebbles M.D.   On: 12/16/2022 00:15      Subjective: Patient seen and examined at bedside.  Feels okay to go home today.  No fever, vomiting, chest pain reported  Discharge Exam: Vitals:   01/07/23 2011 01/08/23 0442  BP: 120/82 113/78  Pulse: 99 85  Resp: 18 18  Temp: 98.3 F (36.8 C) 98.1 F (36.7 C)  SpO2: 100% 100%    General: Pt is alert, awake, not in acute distress.  On room air. Cardiovascular: rate controlled, S1/S2 + Respiratory: bilateral decreased breath sounds at bases Abdominal: Soft, NT, ND, bowel sounds + Extremities: no edema, no cyanosis    The results of significant diagnostics from this hospitalization (including imaging, microbiology, ancillary and laboratory) are listed below for reference.     Microbiology: Recent Results (from the past 240 hours)  Respiratory (~20 pathogens) panel by PCR     Status: None   Collection Time: 01/02/23  5:40 AM   Specimen: Nasopharyngeal Swab; Respiratory  Result Value Ref Range Status   Adenovirus NOT DETECTED NOT DETECTED Final   Coronavirus 229E NOT DETECTED NOT DETECTED Final    Comment: (NOTE) The Coronavirus on the Respiratory Panel, DOES NOT test for the novel  Coronavirus (2019 nCoV)    Coronavirus HKU1 NOT DETECTED NOT DETECTED Final   Coronavirus NL63 NOT DETECTED NOT DETECTED Final   Coronavirus OC43 NOT DETECTED NOT DETECTED Final    Metapneumovirus NOT DETECTED NOT DETECTED Final   Rhinovirus / Enterovirus NOT DETECTED NOT DETECTED Final   Influenza A NOT DETECTED NOT DETECTED Final   Influenza B NOT DETECTED NOT DETECTED Final   Parainfluenza Virus 1 NOT DETECTED NOT DETECTED Final   Parainfluenza Virus 2 NOT DETECTED NOT DETECTED Final   Parainfluenza Virus 3 NOT DETECTED NOT DETECTED Final   Parainfluenza Virus 4 NOT DETECTED NOT DETECTED Final   Respiratory Syncytial Virus NOT DETECTED NOT DETECTED Final   Bordetella pertussis NOT DETECTED NOT DETECTED Final   Bordetella Parapertussis NOT DETECTED NOT DETECTED Final   Chlamydophila pneumoniae NOT DETECTED NOT DETECTED Final   Mycoplasma pneumoniae NOT DETECTED NOT DETECTED Final    Comment: Performed at [facility], [address] [place], [place] [address]  MRSA Next Gen by PCR, Nasal     Status: Abnormal   Collection Time: 01/02/23  8:10 AM   Specimen: Nasal Mucosa; Nasal Swab  Result Value Ref Range Status   MRSA by PCR Next Gen DETECTED (A) NOT DETECTED Final    Comment: (NOTE) The GeneXpert MRSA Assay (FDA approved for NASAL specimens only), is one component of a comprehensive MRSA colonization surveillance program. It is not intended to diagnose MRSA infection nor to guide or monitor treatment for MRSA infections. Test performance is not FDA approved in patients less than 49 years old. Performed at [facility], [address], [place], [place] [address]      Labs: BNP (last 3 results) Recent Labs    03/30/22 0308  BNP 416.8*   Basic Metabolic Panel: Recent Labs  Lab 01/04/23 1354 01/05/23 0306 01/05/23 1513 01/06/23 0925 01/07/23 0744 01/08/23 0659  NA 137 132* 135 135 137 137  K 4.3 3.9 4.1 3.7 3.7 3.4*  CL 110 101 103 102 103 104  CO2 20* 22 25 25 26 27   GLUCOSE 206* 336* 281* 230* 251* 204*  BUN <5* 7 6 11 10 9   CREATININE 0.64 0.65 0.59 0.60 0.54 0.47  CALCIUM  8.9 9.0 9.0 9.1 8.8* 8.6*  MG 1.6* 2.4   --  1.8 1.8 1.7  PHOS  --   --   --  3.2  --   --    Liver Function Tests: Recent Labs  Lab 01/02/23 0539  AST 24  ALT 18  ALKPHOS 219*  BILITOT 1.4*  PROT 8.0  ALBUMIN 3.1*   No results for input(s): LIPASE, AMYLASE in the last 168 hours. No results for input(s): AMMONIA in the last 168 hours. CBC: Recent Labs  Lab 01/03/23 0906 01/05/23 0306 01/06/23 0925 01/07/23 0744 01/08/23 0659  WBC 14.7* 8.3 7.0 7.4 8.6  NEUTROABS  --   --   --  2.3 3.6  HGB 9.3* 9.7* 9.4* 8.7* 8.5*  HCT 29.8* 31.0* 31.7* 28.3* 28.6*  MCV 82.5 81.6 85.2 82.5 83.4  PLT 426* 412* 393 422* 395   Cardiac Enzymes: No results for input(s): CKTOTAL, CKMB, CKMBINDEX, TROPONINI in the last 168 hours. BNP: Invalid input(s): POCBNP CBG: Recent Labs  Lab 01/07/23 1316 01/07/23 1350 01/07/23 1541 01/07/23 2157 01/08/23 0721  GLUCAP 43* 78 153* 153* 187*   D-Dimer No results for input(s): DDIMER in the last 72 hours. Hgb A1c No results for input(s): HGBA1C in the last 72 hours. Lipid Profile No results for input(s): CHOL, HDL, LDLCALC, TRIG, CHOLHDL, LDLDIRECT in the last 72 hours. Thyroid  function studies No results for input(s): TSH, T4TOTAL, T3FREE, THYROIDAB in the last 72 hours.  Invalid input(s): FREET3 Anemia work up No results for input(s): VITAMINB12, FOLATE, FERRITIN, TIBC, IRON , RETICCTPCT in the last 72 hours. Urinalysis    Component Value Date/Time   COLORURINE COLORLESS (A) 01/02/2023 0053   APPEARANCEUR CLEAR 01/02/2023 0053   APPEARANCEUR Clear 06/16/2021 1517   LABSPEC 1.016 01/02/2023 0053   PHURINE 5.0 01/02/2023 0053   GLUCOSEU >=500 (A) 01/02/2023 0053   HGBUR SMALL (A) 01/02/2023 0053   BILIRUBINUR NEGATIVE 01/02/2023 0053   BILIRUBINUR negative 10/14/2021 0942   BILIRUBINUR Negative 06/16/2021 1517   KETONESUR 80 (A) 01/02/2023 0053   PROTEINUR NEGATIVE 01/02/2023 0053   UROBILINOGEN 0.2 10/14/2021 0942    UROBILINOGEN 0.2 08/26/2019 1827   NITRITE NEGATIVE 01/02/2023 0053   LEUKOCYTESUR NEGATIVE 01/02/2023 0053   Sepsis Labs Recent Labs  Lab 01/05/23 0306 01/06/23 0925 01/07/23 0744 01/08/23 0659  WBC 8.3 7.0 7.4 8.6   Microbiology Recent Results (from the past 240 hours)  Respiratory (~20 pathogens) panel by PCR     Status: None   Collection Time: 01/02/23  5:40 AM   Specimen: Nasopharyngeal Swab; Respiratory  Result Value Ref Range Status   Adenovirus NOT DETECTED NOT DETECTED Final   Coronavirus 229E NOT DETECTED NOT DETECTED Final    Comment: (NOTE) The Coronavirus on the Respiratory Panel, DOES NOT test for the novel  Coronavirus (2019 nCoV)    Coronavirus HKU1 NOT DETECTED NOT DETECTED Final   Coronavirus NL63 NOT DETECTED NOT DETECTED Final   Coronavirus OC43 NOT DETECTED NOT DETECTED Final   Metapneumovirus NOT DETECTED NOT DETECTED Final   Rhinovirus / Enterovirus NOT DETECTED NOT DETECTED Final   Influenza A NOT DETECTED NOT DETECTED Final   Influenza B NOT DETECTED NOT DETECTED Final   Parainfluenza Virus 1 NOT DETECTED NOT DETECTED Final   Parainfluenza Virus 2 NOT DETECTED NOT DETECTED Final   Parainfluenza Virus 3 NOT DETECTED NOT DETECTED Final   Parainfluenza Virus 4 NOT DETECTED NOT DETECTED Final   Respiratory Syncytial Virus NOT DETECTED NOT DETECTED Final   Bordetella pertussis NOT DETECTED NOT DETECTED Final   Bordetella Parapertussis NOT DETECTED NOT DETECTED Final   Chlamydophila pneumoniae NOT DETECTED NOT DETECTED Final   Mycoplasma pneumoniae NOT DETECTED NOT DETECTED Final    Comment: Performed at [facility], [address] [place], [place] [address]  MRSA Next Gen by PCR, Nasal     Status: Abnormal   Collection Time: 01/02/23  8:10 AM   Specimen: Nasal Mucosa; Nasal Swab  Result Value Ref Range Status   MRSA by PCR Next Gen DETECTED (A) NOT DETECTED Final    Comment: (NOTE) The GeneXpert MRSA Assay (FDA approved for NASAL specimens  only), is one component of a comprehensive MRSA colonization surveillance program. It is not intended to diagnose MRSA infection nor to guide or monitor treatment for MRSA infections. Test performance is not FDA approved in patients less than 93 years old. Performed at [facility], [address], [place], [place] [address]      Time coordinating discharge: 35 minutes  SIGNED:   Sophie Mao, MD  [facility] 01/08/2023, 10:44 AM

## 2023-01-08 NOTE — TOC Transition Note (Signed)
 Transition of Care [facility]) - Discharge Note   Patient Details  Name: Dawn [name] MRN: [id number] Date of Birth: 2000/06/25  [facility]) CM/SW Contact:  Dalila Camellia SAUNDERS, LCSW Phone Number: 01/08/2023, 11:24 AM   Clinical Narrative:     SDOH needs have been addressed, resources have been provided for patient on her AVS.  No other TOC needs, CSW signing off.    Final next level of care: Home/Self Care Barriers to Discharge: Barriers Resolved   Patient Goals and CMS Choice Patient states their goals for this hospitalization and ongoing recovery are:: To return to a hotel          Discharge Placement                       Discharge Plan and Services Additional resources added to the After Visit Summary for   In-house Referral: Clinical Social Work                                   Social Drivers of Health (SDOH) Interventions SDOH Screenings   Food Insecurity: Food Insecurity Present (01/02/2023)  Housing: High Risk (01/02/2023)  Transportation Needs: No Transportation Needs (01/02/2023)  Recent Concern: Transportation Needs - Unmet Transportation Needs (12/16/2022)  Utilities: Not At Risk (01/02/2023)  Alcohol Screen: Low Risk  (08/12/2022)  Depression (PHQ2-9): Medium Risk (10/31/2022)  Financial Resource Strain: High Risk (08/12/2022)  Physical Activity: Unknown (07/04/2018)  Social Connections: Moderately Isolated (08/12/2022)  Stress: Unknown (07/04/2018)  Tobacco Use: Medium Risk (01/04/2023)     Readmission Risk Interventions    01/03/2023   11:17 AM 03/07/2022   10:22 AM  Readmission Risk Prevention Plan  Transportation Screening Complete Complete  PCP or Specialist Appt within 5-7 Days Complete   Home Care Screening Complete   Medication Review (RN CM) Complete   Medication Review (RN Care Manager)  Complete  PCP or Specialist appointment within 3-5 days of discharge  Complete  HRI or Home Care Consult  Complete  SW Recovery  Care/Counseling Consult  Complete  Palliative Care Screening  Not Applicable  Skilled Nursing Facility  Not Applicable

## 2023-01-11 ENCOUNTER — Other Ambulatory Visit: Payer: Self-pay

## 2023-01-11 ENCOUNTER — Emergency Department (HOSPITAL_COMMUNITY)
Admission: EM | Admit: 2023-01-11 | Discharge: 2023-01-12 | Disposition: A | Discharge status: Home / Self Care | Payer: MEDICAID | Attending: Emergency Medicine | Admitting: Emergency Medicine

## 2023-01-11 ENCOUNTER — Encounter (HOSPITAL_COMMUNITY): Payer: Self-pay

## 2023-01-11 DIAGNOSIS — M79606 Pain in leg, unspecified: Secondary | ICD-10-CM | POA: Diagnosis present

## 2023-01-11 DIAGNOSIS — E1065 Type 1 diabetes mellitus with hyperglycemia: Secondary | ICD-10-CM | POA: Insufficient documentation

## 2023-01-11 DIAGNOSIS — Z794 Long term (current) use of insulin: Secondary | ICD-10-CM | POA: Insufficient documentation

## 2023-01-11 DIAGNOSIS — Z79899 Other long term (current) drug therapy: Secondary | ICD-10-CM | POA: Diagnosis not present

## 2023-01-11 DIAGNOSIS — J45909 Unspecified asthma, uncomplicated: Secondary | ICD-10-CM | POA: Insufficient documentation

## 2023-01-11 DIAGNOSIS — E860 Dehydration: Secondary | ICD-10-CM | POA: Insufficient documentation

## 2023-01-11 DIAGNOSIS — Z91040 Latex allergy status: Secondary | ICD-10-CM | POA: Insufficient documentation

## 2023-01-11 DIAGNOSIS — T1490XD Injury, unspecified, subsequent encounter: Secondary | ICD-10-CM

## 2023-01-11 DIAGNOSIS — T8130XA Disruption of wound, unspecified, initial encounter: Secondary | ICD-10-CM | POA: Insufficient documentation

## 2023-01-11 DIAGNOSIS — R000 Tachycardia, unspecified: Secondary | ICD-10-CM | POA: Insufficient documentation

## 2023-01-11 LAB — CBC WITH DIFFERENTIAL/PLATELET
Abs Immature Granulocytes: 0.02 10*3/uL (ref 0.00–0.07)
Basophils Absolute: 0 10*3/uL (ref 0.0–0.1)
Basophils Relative: 0 %
Eosinophils Absolute: 0.1 10*3/uL (ref 0.0–0.5)
Eosinophils Relative: 1 %
HCT: 39.5 % (ref 36.0–46.0)
Hemoglobin: 11.9 g/dL — ABNORMAL LOW (ref 12.0–15.0)
Immature Granulocytes: 0 %
Lymphocytes Relative: 46 %
Lymphs Abs: 4.7 10*3/uL — ABNORMAL HIGH (ref 0.7–4.0)
MCH: 24.7 pg — ABNORMAL LOW (ref 26.0–34.0)
MCHC: 30.1 g/dL (ref 30.0–36.0)
MCV: 82.1 fL (ref 80.0–100.0)
Monocytes Absolute: 1 10*3/uL (ref 0.1–1.0)
Monocytes Relative: 9 %
Neutro Abs: 4.5 10*3/uL (ref 1.7–7.7)
Neutrophils Relative %: 44 %
Platelets: 530 10*3/uL — ABNORMAL HIGH (ref 150–400)
RBC: 4.81 MIL/uL (ref 3.87–5.11)
RDW: 16.7 % — ABNORMAL HIGH (ref 11.5–15.5)
WBC: 10.3 10*3/uL (ref 4.0–10.5)
nRBC: 0 % (ref 0.0–0.2)

## 2023-01-11 LAB — BLOOD GAS, VENOUS
Acid-base deficit: 3.9 mmol/L — ABNORMAL HIGH (ref 0.0–2.0)
Bicarbonate: 21.6 mmol/L (ref 20.0–28.0)
O2 Saturation: 30.4 %
Patient temperature: 37
pCO2, Ven: 40 mm[Hg] — ABNORMAL LOW (ref 44–60)
pH, Ven: 7.34 (ref 7.25–7.43)
pO2, Ven: <31 mm[Hg] — CL (ref 32–45)

## 2023-01-11 LAB — CBG MONITORING, ED
Glucose-Capillary: 129 mg/dL — ABNORMAL HIGH (ref 70–99)
Glucose-Capillary: 38 mg/dL — CL (ref 70–99)
Glucose-Capillary: 68 mg/dL — ABNORMAL LOW (ref 70–99)
Glucose-Capillary: 296 mg/dL — ABNORMAL HIGH (ref 70–99)

## 2023-01-11 LAB — BASIC METABOLIC PANEL
Anion gap: 17 — ABNORMAL HIGH (ref 5–15)
Anion gap: 14 (ref 5–15)
BUN: 28 mg/dL — ABNORMAL HIGH (ref 6–20)
BUN: 27 mg/dL — ABNORMAL HIGH (ref 6–20)
CO2: 14 mmol/L — ABNORMAL LOW (ref 22–32)
CO2: 19 mmol/L — ABNORMAL LOW (ref 22–32)
Calcium: 10.3 mg/dL (ref 8.9–10.3)
Calcium: 9.5 mg/dL (ref 8.9–10.3)
Chloride: 101 mmol/L (ref 98–111)
Chloride: 97 mmol/L — ABNORMAL LOW (ref 98–111)
Creatinine, Ser: 0.86 mg/dL (ref 0.44–1.00)
Creatinine, Ser: 0.83 mg/dL (ref 0.44–1.00)
GFR, Estimated: >60 mL/min (ref >60)
GFR, Estimated: >60 mL/min (ref >60)
Glucose, Bld: 98 mg/dL (ref 70–99)
Glucose, Bld: 388 mg/dL — ABNORMAL HIGH (ref 70–99)
Potassium: 4.3 mmol/L (ref 3.5–5.1)
Potassium: 4.1 mmol/L (ref 3.5–5.1)
Sodium: 132 mmol/L — ABNORMAL LOW (ref 135–145)
Sodium: 130 mmol/L — ABNORMAL LOW (ref 135–145)

## 2023-01-11 LAB — BETA-HYDROXYBUTYRIC ACID: Beta-Hydroxybutyric Acid: 3.61 mmol/L — ABNORMAL HIGH (ref 0.05–0.27)

## 2023-01-11 MED ORDER — INSULIN ASPART 100 UNIT/ML IJ SOLN
10.0000 [IU] | Freq: Once | INTRAMUSCULAR | Status: AC
  Administered 2023-01-12: 10 [IU] via INTRAVENOUS
  Filled 2023-01-11: qty 0.1

## 2023-01-11 MED ORDER — ONDANSETRON HCL 4 MG/2ML IJ SOLN
4.0000 mg | Freq: Once | INTRAMUSCULAR | Status: AC
Start: 2023-01-11 — End: 2023-01-11
  Administered 2023-01-11: 4 mg via INTRAVENOUS
  Filled 2023-01-11: qty 2

## 2023-01-11 MED ORDER — OXYCODONE-ACETAMINOPHEN 5-325 MG PO TABS: 1.0000 | ORAL_TABLET | Freq: Four times a day (QID) | ORAL | 0 refills | Status: DC | PRN

## 2023-01-11 MED ORDER — LACTATED RINGERS IV BOLUS
1000.0000 mL | Freq: Once | INTRAVENOUS | Status: AC
  Administered 2023-01-11: 1000 mL via INTRAVENOUS

## 2023-01-11 MED ORDER — OXYCODONE-ACETAMINOPHEN 5-325 MG PO TABS
1.0000 | ORAL_TABLET | Freq: Once | ORAL | Status: AC
  Administered 2023-01-11: 1 via ORAL
  Filled 2023-01-11: qty 1

## 2023-01-11 MED ORDER — MORPHINE SULFATE (PF) 4 MG/ML IV SOLN
4.0000 mg | Freq: Once | INTRAVENOUS | Status: AC
  Administered 2023-01-11: 4 mg via INTRAVENOUS
  Filled 2023-01-11: qty 1

## 2023-01-11 NOTE — ED Notes (Signed)
 Unsuccessful IV attempt x2.

## 2023-01-11 NOTE — ED Notes (Signed)
 [place] PIV placed.

## 2023-01-11 NOTE — ED Notes (Signed)
 Pt made aware of need for urine specimen. Pt given heat pack and ice water at this time.

## 2023-01-11 NOTE — ED Notes (Signed)
 Re-dressed pt wound with a wet to dry dressing

## 2023-01-11 NOTE — ED Triage Notes (Signed)
 BIB EMS from home for pain in left thigh, pt had cyst removed here when admitted. C/o general malaise.  D/c here 3 days ago for DKA.

## 2023-01-11 NOTE — Discharge Instructions (Addendum)
 Continue to drink fluids and attach her insulin  pump when you get home.  You were given 10 units here before you left.  Labs do not show DKA at this time.  However if you start developing symptoms of DKA including vomiting, abdominal pain or other concerns return to the emergency room.  Change your packing at least every other day until the wound is healed.  Follow-up as planned.

## 2023-01-11 NOTE — ED Notes (Signed)
 Unable to get access on pt for labs.

## 2023-01-11 NOTE — ED Provider Triage Note (Signed)
 Emergency Medicine Provider Triage Evaluation Note  Felicite Zeimet , a 23 y.o. female  was evaluated in triage.  Pt complains of left thigh pain following cyst removal. I&D of cyst on 01/06/23 and discharged with doxy which she started on Tuesday. Cyst is bleeding  Review of Systems  Positive: Left leg pain Negative: fevers  Physical Exam  BP 108/72   Pulse (!) 118   Temp (!) 97.5 F (36.4 C) (Oral)   Resp 18   Ht 5' (1.524 m)   Wt 51 kg   SpO2 100%   BMI 21.96 kg/m  Gen:   Awake, no distress   Resp:  Normal effort  MSK:   Moves extremities without difficulty  Other:    Medical Decision Making  Medically screening exam initiated at 4:32 PM.  Appropriate orders placed.  Velina Drollinger was informed that the remainder of the evaluation will be completed by another provider, this initial triage assessment does not replace that evaluation, and the importance of remaining in the ED until their evaluation is complete.  Labs ordered   Minnie Tinnie BRAVO, [place] 01/11/23 [id number]

## 2023-01-11 NOTE — ED Provider Notes (Signed)
 [facility] AT [facility] Provider Note   CSN: [id number] Arrival date & time: 01/11/23  1444     History  Chief Complaint  Patient presents with   Leg Pain    Dawn [name] is a 23 y.o. female.  Pt is a 23 years old female with PMH significant for type 1 diabetes on insulin  pump, multiple recent hospitalizations for DKA, most recent being on 12/25/2022, ADHD, asthma, iron  deficiency who had a groin abscess that required I&D.  Patient returns today reporting she is just not been feeling well for the last few days at home.  She has been feeling shaky intermittently and is having a lot of pain in her left groin area where they did the I&D and she did not change the bandage for the last 2 days due to pain.  She has not had fever or vomiting.  She has been compliant with her insulin .  She denies any abdominal pain at this time.  She last used her insulin  before coming to the hospital.  She was noted to be hypoglycemic here with a blood sugar down to the 30s but it did resolve after eating.  She denies any urinary symptoms.  She has not had cough or congestion.  Denies any chest pain or shortness of breath.  The history is provided by the patient.  Leg Pain      Home Medications Prior to Admission medications   Medication Sig Start Date End Date Taking? Authorizing Provider  oxyCODONE -acetaminophen  (PERCOCET/ROXICET) 5-325 MG tablet Take 1 tablet by mouth every 6 (six) hours as needed for severe pain (pain score 7-10). 01/11/23  Yes Josceline Chenard, Benton, MD  Continuous Glucose Sensor (DEXCOM G7 SENSOR) MISC Inject 1 Application into the skin as directed. 10/30/22   Diona Perkins, MD  doxycycline  (VIBRA -TABS) 100 MG tablet Take 1 tablet (100 mg total) by mouth 2 (two) times daily for 5 days. 01/08/23 01/13/23  Cheryle Page, MD  escitalopram  (LEXAPRO ) 20 MG tablet Take 20 mg by mouth daily as needed. 10/04/22 01/02/23  [provider]  ferrous sulfate  324 MG  TBEC Take 1 tablet (324 mg total) by mouth every other day. 11/02/22   Gomes, Adriana, DO  Glucagon  (BAQSIMI  TWO PACK) 3 MG/DOSE POWD Place 3 mg into the nose as needed (low blood sugar).    [provider]  ibuprofen  (ADVIL ) 800 MG tablet Take 800 mg by mouth every 6 (six) hours as needed. 12/11/22   [provider]  Insulin  Aspart FlexPen (NOVOLOG ) 100 UNIT/ML Inject 0-50 Units into the skin See admin instructions. INJECT 1 UNIT FOR EVERY 20G OF CARBS. PLUS 1 UNIT FOR EVERY 50 ABOVE 150 MG/DL PREMEALS. MAX DOSAGE 50U/DAY Patient taking differently: Inject 0-50 Units into the skin See admin instructions. INJECT 1 UNIT FOR EVERY 25G OF CARBS. PLUS 1 UNIT FOR EVERY 50 ABOVE 150 MG/DL PREMEALS. MAX DOSAGE 50U/DAY 12/26/22   Cindy Garnette POUR, MD  insulin  degludec ([name] ) 100 UNIT/ML FlexTouch Pen Inject 30 Units into the skin daily. Per your Endocrinologist, increase by 2 units every 3 days until fasting BG is 200mg /dL or less 87/76/75   Cindy Garnette POUR, MD  medroxyPROGESTERone  (DEPO-PROVERA ) 150 MG/ML injection Inject 150 mg into the muscle every 3 (three) months. Patient not taking: Reported on 12/16/2022 12/11/22   [provider]  norgestimate -ethinyl estradiol  (SPRINTEC 28) 0.25-35 MG-MCG tablet Take 1 tablet by mouth daily. Patient not taking: Reported on 12/16/2022 12/12/22   Dameron, Marisa, DO  SUMAtriptan  (IMITREX ) 25 MG tablet Take 0.5 tablets (12.5 mg total) by mouth every 2 (two) hours as needed for migraine. May repeat in 2 hours if headache persists or recurs. 04/12/21   Hope Merle, MD  traMADol  (ULTRAM ) 50 MG tablet Take 1 tablet (50 mg total) by mouth every 6 (six) hours as needed for severe pain (pain score 7-10) or moderate pain (pain score 4-6). 01/08/23   Sheldon Standing, MD  Vitamin D , Ergocalciferol , (DRISDOL ) 1.25 MG (50000 UNIT) CAPS capsule Take 50,000 Units by mouth every 7 (seven) days. 05/24/22   [provider]      Allergies    Latex, Tape,  and Shellfish allergy    Review of Systems   Review of Systems  Physical Exam Updated Vital Signs BP 134/80   Pulse (!) 119   Temp 97.6 F (36.4 C)   Resp 18   Ht 5' (1.524 m)   Wt 51 kg   SpO2 100%   BMI 21.96 kg/m  Physical Exam Vitals and nursing note reviewed.  Constitutional:      General: She is not in acute distress.    Appearance: She is well-developed.     Comments: Faint scent of ketones  HENT:     Head: Normocephalic and atraumatic.     Mouth/Throat:     Mouth: Mucous membranes are moist.  Eyes:     Conjunctiva/sclera: Conjunctivae normal.     Pupils: Pupils are equal, round, and reactive to light.  Cardiovascular:     Rate and Rhythm: Regular rhythm. Tachycardia present.     Heart sounds: No murmur heard. Pulmonary:     Effort: Pulmonary effort is normal. No respiratory distress.     Breath sounds: Normal breath sounds. No wheezing or rales.  Abdominal:     General: There is no distension.     Palpations: Abdomen is soft.     Tenderness: There is no abdominal tenderness. There is no guarding or rebound.  Genitourinary:    Comments: Abscess present in the left inner thigh/groin area.  Packing in place.  Appears to be healing well without any drainage or erythema Musculoskeletal:        General: No tenderness. Normal range of motion.     Cervical back: Normal range of motion and neck supple.  Skin:    General: Skin is warm and dry.     Findings: No erythema or rash.  Neurological:     Mental Status: She is alert and oriented to person, place, and time. Mental status is at baseline.  Psychiatric:        Behavior: Behavior normal.     ED Results / Procedures / Treatments   Labs (all labs ordered are listed, but only abnormal results are displayed) Labs Reviewed  CBC WITH DIFFERENTIAL/PLATELET - Abnormal; Notable for the following components:      Result Value   Hemoglobin 11.9 (*)    MCH 24.7 (*)    RDW 16.7 (*)    Platelets 530 (*)    Lymphs  Abs 4.7 (*)    All other components within normal limits  BASIC METABOLIC PANEL - Abnormal; Notable for the following components:   Sodium 132 (*)    CO2 14 (*)    BUN 28 (*)    Anion gap 17 (*)    All other components within normal limits  BLOOD GAS, VENOUS - Abnormal; Notable for the following components:   pCO2, Ven 40 (*)    pO2, Ven <  31 (*)    Acid-base deficit 3.9 (*)    All other components within normal limits  BETA-HYDROXYBUTYRIC ACID - Abnormal; Notable for the following components:   Beta-Hydroxybutyric Acid 3.61 (*)    All other components within normal limits  BASIC METABOLIC PANEL - Abnormal; Notable for the following components:   Sodium 130 (*)    Chloride 97 (*)    CO2 19 (*)    Glucose, Bld 388 (*)    BUN 27 (*)    All other components within normal limits  CBG MONITORING, ED - Abnormal; Notable for the following components:   Glucose-Capillary 129 (*)    All other components within normal limits  CBG MONITORING, ED - Abnormal; Notable for the following components:   Glucose-Capillary 38 (*)    All other components within normal limits  CBG MONITORING, ED - Abnormal; Notable for the following components:   Glucose-Capillary 68 (*)    All other components within normal limits  CBG MONITORING, ED - Abnormal; Notable for the following components:   Glucose-Capillary 296 (*)    All other components within normal limits  URINALYSIS, ROUTINE W REFLEX MICROSCOPIC    EKG None  Radiology No results found.  Procedures Procedures    Medications Ordered in ED Medications  insulin  aspart (novoLOG ) injection 10 Units (has no administration in time range)  lactated ringers  bolus 1,000 mL (0 mLs Intravenous Stopped 01/11/23 2236)  morphine  (PF) 4 MG/ML injection 4 mg (4 mg Intravenous Given 01/11/23 2129)  ondansetron  (ZOFRAN ) injection 4 mg (4 mg Intravenous Given 01/11/23 2129)  oxyCODONE -acetaminophen  (PERCOCET/ROXICET) 5-325 MG per tablet 1 tablet (1 tablet Oral  Given 01/11/23 2245)  lactated ringers  bolus 1,000 mL (1,000 mLs Intravenous New Bag/Given 01/11/23 2309)    ED Course/ Medical Decision Making/ A&P                                 Medical Decision Making Amount and/or Complexity of Data Reviewed Labs: ordered. Decision-making details documented in ED Course.  Risk Prescription drug management.   Pt with multiple medical problems and comorbidities and presenting today with a complaint that caries a high risk for morbidity and mortality.  Here today with the above complaints.  Concern for dehydration, DKA, new infectious etiology or wound issue.  Patient reports compliance with her insulin .  Was noted to be hypoglycemic here but did resolve after eating.  Based on my exam patient's abscess appears to be healing.  Pink granulation tissue present in the wound and no signs of infection.  Patient was tachycardic but has improved with 1 L of IV fluid.  I independently interpreted patient's labs and CBC is within normal limits, BMP with a blood sugar of 98, normal renal function but his CO2 of 14 and anion gap of 17.  A VBG was done which is normal today pH, normal bicarb, and CO2.  Beta-hydroxy is elevated at 3.6.  After fluid will repeat BMP.  However patient is feeling well.  Feel that she will most likely be able to go home continuing her insulin  pump and ensuring she is getting plenty of fluids.  11:34 PM Repeat BMP shows improved anion gap now of 14, CO2 was improved to 19 from 12.  Patient is now more hyperglycemic at 388 and she was given 10 units of insulin .  She reports taking off her pump today because she was going to change it.  Do not feel that patient is in DKA at this time and is stable to go home.  She will attach her pump when she gets home.  She was given a different pain medication as tramadol  has not been helpful and is needing this for wound care.  At this time patient is stable for discharge home.  Repeat heart rate after fluids was 95  on continuous cardiac monitoring.         Final Clinical Impression(s) / ED Diagnoses Final diagnoses:  Healing wound  Dehydration    Rx / DC Orders ED Discharge Orders          Ordered    oxyCODONE -acetaminophen  (PERCOCET/ROXICET) 5-325 MG tablet  Every 6 hours PRN        01/11/23 2250              Doretha Folks, MD 01/11/23 2335

## 2023-01-11 NOTE — ED Notes (Addendum)
 Pt. Given graham crackers and apple juice per RN

## 2023-01-12 ENCOUNTER — Ambulatory Visit: Payer: MEDICAID | Admitting: Student

## 2023-01-19 ENCOUNTER — Telehealth: Payer: MEDICAID | Admitting: Physician Assistant

## 2023-01-19 DIAGNOSIS — L231 Allergic contact dermatitis due to adhesives: Secondary | ICD-10-CM | POA: Diagnosis not present

## 2023-01-19 MED ORDER — TRIAMCINOLONE ACETONIDE 0.1 % EX CREA: 1.0000 | TOPICAL_CREAM | Freq: Two times a day (BID) | CUTANEOUS | 0 refills | Status: DC

## 2023-01-19 NOTE — Patient Instructions (Signed)
Dawn [name], thank you for joining [name], PA-C for today's virtual visit.  While this provider is not your primary care provider (PCP), if your PCP is located in our provider database this encounter information will be shared with them immediately following your visit.   A [facility] MyChart account gives you access to today's visit and all your visits, tests, and labs performed at [facility] " click here if you don't have a [facility] MyChart account or go to [email or web address]  Consent: (Patient) Dawn [name] provided verbal consent for this virtual visit at the beginning of the encounter.  Current Medications:  Current Outpatient Medications:    triamcinolone cream (KENALOG) 0.1 %, Apply 1 Application topically 2 (two) times daily., Disp: 30 g, Rfl: 0   Continuous Glucose Sensor (DEXCOM G7 SENSOR) MISC, Inject 1 Application into the skin as directed., Disp: 3 each, Rfl: 3   escitalopram (LEXAPRO) 20 MG tablet, Take 20 mg by mouth daily as needed., Disp: , Rfl:    ferrous sulfate 324 MG TBEC, Take 1 tablet (324 mg total) by mouth every other day., Disp: 90 tablet, Rfl: 1   Glucagon (BAQSIMI TWO PACK) 3 MG/DOSE POWD, Place 3 mg into the nose as needed (low blood sugar)., Disp: , Rfl:    ibuprofen (ADVIL) 800 MG tablet, Take 800 mg by mouth every 6 (six) hours as needed., Disp: , Rfl:    Insulin Aspart FlexPen (NOVOLOG) 100 UNIT/ML, Inject 0-50 Units into the skin See admin instructions. INJECT 1 UNIT FOR EVERY 20G OF CARBS. PLUS 1 UNIT FOR EVERY 50 ABOVE 150 MG/DL PREMEALS. MAX DOSAGE 50U/DAY (Patient taking differently: Inject 0-50 Units into the skin See admin instructions. INJECT 1 UNIT FOR EVERY 25G OF CARBS. PLUS 1 UNIT FOR EVERY 50 ABOVE 150 MG/DL PREMEALS. MAX DOSAGE 50U/DAY), Disp: , Rfl:    insulin degludec ([name]) 100 UNIT/ML FlexTouch Pen, Inject 30 Units into the skin daily. Per your Endocrinologist, increase by 2 units every 3 days until  fasting BG is 200mg /dL or less, Disp: , Rfl:    medroxyPROGESTERone (DEPO-PROVERA) 150 MG/ML injection, Inject 150 mg into the muscle every 3 (three) months. (Patient not taking: Reported on 12/16/2022), Disp: , Rfl:    norgestimate-ethinyl estradiol (SPRINTEC 28) 0.25-35 MG-MCG tablet, Take 1 tablet by mouth daily. (Patient not taking: Reported on 12/16/2022), Disp: 28 tablet, Rfl: 11   oxyCODONE-acetaminophen (PERCOCET/ROXICET) 5-325 MG tablet, Take 1 tablet by mouth every 6 (six) hours as needed for severe pain (pain score 7-10)., Disp: 15 tablet, Rfl: 0   SUMAtriptan (IMITREX) 25 MG tablet, Take 0.5 tablets (12.5 mg total) by mouth every 2 (two) hours as needed for migraine. May repeat in 2 hours if headache persists or recurs., Disp: 10 tablet, Rfl: 0   traMADol (ULTRAM) 50 MG tablet, Take 1 tablet (50 mg total) by mouth every 6 (six) hours as needed for severe pain (pain score 7-10) or moderate pain (pain score 4-6)., Disp: 20 tablet, Rfl: 0   Vitamin D, Ergocalciferol, (DRISDOL) 1.25 MG (50000 UNIT) CAPS capsule, Take 50,000 Units by mouth every 7 (seven) days., Disp: , Rfl:    Medications ordered in this encounter:  Meds ordered this encounter  Medications   triamcinolone cream (KENALOG) 0.1 %    Sig: Apply 1 Application topically 2 (two) times daily.    Dispense:  30 g    Refill:  0    Supervising Provider:   Merrilee Jansky [id number]     *If  you need refills on other medications prior to your next appointment, please contact your pharmacy*  Follow-Up: Call back or seek an in-person evaluation if the symptoms worsen or if the condition fails to improve as anticipated.  [facility] [phone]  Other Instructions  Wound Care, Adult Taking care of your wound properly can help to prevent pain, infection, and scarring. It can also help your wound heal more quickly. Follow instructions from your health care provider about how to care for your wound. Supplies  needed: Soap and water. Wound cleanser, saline, or germ-free (sterile) water. Gauze. If needed, a clean bandage (dressing) or other type of wound dressing material to cover or place in the wound. Follow your health care provider's instructions about what dressing supplies to use. Cream or topical ointment to apply to the wound, if told by your health care provider. How to care for your wound Cleaning the wound Ask your health care provider how to clean the wound. This may include: Using mild soap and water, a wound cleanser, saline, or sterile water. Using a clean gauze to pat the wound dry after cleaning it. Do not rub or scrub the wound. Dressing care Wash your hands with soap and water for at least 20 seconds before and after you change the dressing. If soap and water are not available, use hand sanitizer. Change your dressing as told by your health care provider. This may include: Cleaning or rinsing out (irrigating) the wound. Application of cream or topical ointment, if told by your health care provider. Placing a dressing over the wound or in the wound (packing). Covering the wound with an outer dressing. Leave stitches (sutures), staples, skin glue, or adhesive strips in place. These skin closures may need to stay in place for 2 weeks or longer. If adhesive strip edges start to loosen and curl up, you may trim the loose edges. Do not remove adhesive strips completely unless your health care provider tells you to do that. Ask your health care provider when you can leave the wound uncovered. Checking for infection Check your wound area every day for signs of infection. Check for: More redness, swelling, or pain. Fluid or blood. Warmth. Pus or a bad smell.  Follow these instructions at home Medicines If you were prescribed an antibiotic medicine, cream, or ointment, take or apply it as told by your health care provider. Do not stop using the antibiotic even if your condition  improves. If you were prescribed pain medicine, take it 30 minutes before you do any wound care or as told by your health care provider. Take over-the-counter and prescription medicines only as told by your health care provider. Eating and drinking Eat a diet that includes protein, vitamin A, vitamin C, and other nutrient-rich foods to help the wound heal. Foods rich in protein include meat, fish, eggs, dairy, beans, and nuts. Foods rich in vitamin A include carrots and dark green, leafy vegetables. Foods rich in vitamin C include citrus fruits, tomatoes, broccoli, and peppers. Drink enough fluid to keep your urine pale yellow. General instructions Do not take baths, swim, or use a hot tub until your health care provider approves. Ask your health care provider if you may take showers. You may only be allowed to take sponge baths. Do not scratch or pick at the wound. Keep it covered as told by your health care provider. Return to your normal activities as told by your health care provider. Ask your health care provider what  activities are safe for you. Protect your wound from the sun when you are outside for the first 6 months, or for as long as told by your health care provider. Cover up the scar area or apply sunscreen that has an SPF of at least 30. Do not use any products that contain nicotine or tobacco. These products include cigarettes, chewing tobacco, and vaping devices, such as e-cigarettes. If you need help quitting, ask your health care provider. Keep all follow-up visits. This is important. Contact a health care provider if: You received a tetanus shot and you have swelling, severe pain, redness, or bleeding at the injection site. Your pain is not controlled with medicine. You have any of these signs of infection: More redness, swelling, or pain around the wound. Fluid or blood coming from the wound. Warmth coming from the wound. A fever or chills. You are nauseous or you  vomit. You are dizzy. You have a new rash or hardness around the wound. Get help right away if: You have a red streak of skin near the area around your wound. Pus or a bad smell coming from the wound. Your wound has been closed with staples, sutures, skin glue, or adhesive strips and it begins to open up and separate. Your wound is bleeding, and the bleeding does not stop with gentle pressure. These symptoms may represent a serious problem that is an emergency. Do not wait to see if the symptoms will go away. Get medical help right away. Call your local emergency services (911 in the [place] Do not drive yourself to the hospital. Summary Always wash your hands with soap and water for at least 20 seconds before and after changing your dressing. Change your dressing as told by your health care provider. To help with healing, eat foods that are rich in protein, vitamin A, vitamin C, and other nutrients. Check your wound every day for signs of infection. Contact your health care provider if you think that your wound is infected. This information is not intended to replace advice given to you by your health care provider. Make sure you discuss any questions you have with your health care provider. Document Revised: 04/28/2020 Document Reviewed: 04/28/2020 Elsevier Patient Education  2024 [employer]   If you have been instructed to have an in-person evaluation today at a local Urgent Care facility, please use the link below. It will take you to a list of all of our available [facility], including address, phone number and hours of operation. Please do not delay care.  [facility]  If you or a family member do not have a primary care provider, use the link below to schedule a visit and establish care. When you choose a [facility] primary care physician or advanced practice provider, you gain a long-term partner in health. Find a Primary Care Provider  Learn more about  [facility]'s in-office and virtual care options:  - Get Care Now

## 2023-01-19 NOTE — Progress Notes (Signed)
Virtual Visit Consent   Dawn [name], you are scheduled for a virtual visit with a [facility] provider today. Just as with appointments in the office, your consent must be obtained to participate. Your consent will be active for this visit and any virtual visit you may have with one of our providers in the next 365 days. If you have a MyChart account, a copy of this consent can be sent to you electronically.  As this is a virtual visit, video technology does not allow for your provider to perform a traditional examination. This may limit your provider's ability to fully assess your condition. If your provider identifies any concerns that need to be evaluated in person or the need to arrange testing (such as labs, EKG, etc.), we will make arrangements to do so. Although advances in technology are sophisticated, we cannot ensure that it will always work on either your end or our end. If the connection with a video visit is poor, the visit may have to be switched to a telephone visit. With either a video or telephone visit, we are not always able to ensure that we have a secure connection.  By engaging in this virtual visit, you consent to the provision of healthcare and authorize for your insurance to be billed (if applicable) for the services provided during this visit. Depending on your insurance coverage, you may receive a charge related to this service.  I need to obtain your verbal consent now. Are you willing to proceed with your visit today? [name] has provided verbal consent on 01/19/2023 for a virtual visit (video or telephone). [name], PA-C  Date: 01/19/2023 4:15 PM  Virtual Visit via Video Note   I, [name], connected with  Dawn [name]  ([id number], 01-28-2000) on 01/19/23 at  4:15 PM EST by a video-enabled telemedicine application and verified that I am speaking with the correct person using two identifiers.  Location: Patient: Virtual Visit Location  Patient: Home Provider: Virtual Visit Location Provider: Home Office   I discussed the limitations of evaluation and management by telemedicine and the availability of in person appointments. The patient expressed understanding and agreed to proceed.    History of Present Illness: Dawn [name] is a 23 y.o. who identifies as a female who was assigned female at birth, and is being seen today for sore on inner thigh of left leg.   She was admitted to the hospital on 01/01/23 for T1DM with DKA and found to have an abscess in her left groin. She underwent I&D on 01/06/23. She was discharged on 01/08/23.  She was seen in follow up at [facility] ER on 01/11/23 for pain at the incision site and had not changed the bandage due to pain. Wound was examined and reported to be healing okay. She was given Percocet for pain.  She does report she has been cleaning the wound and changing bandages. However, she is having skin irritation around the wound from adhesive tape holding the bandage on. She has documented allergy to adhesives. She has been using paper tape to try to lessen the irritation, but that is not helping.    Problems:  Patient Active Problem List   Diagnosis Date Noted   DKA (diabetic ketoacidosis) (HCC) 12/25/2022   Pelvic pain 12/16/2022   Screening examination for STI 12/15/2022   Contraception management 12/15/2022   COVID-19 10/02/2022   Sinus tachycardia 08/12/2022   Chronic musculoskeletal pain 08/03/2022   Sepsis (HCC) 03/28/2022  Housing insecurity 08/09/2021   Menstrual periods irregular 06/17/2021   Diabetic ketoacidosis without coma associated with type 1 diabetes mellitus (HCC) 04/10/2021   Tension headache, chronic 12/24/2019   Hx of migraines 07/11/2019   Protein-calorie malnutrition, severe 06/25/2019   Asthma 09/25/2018   Hyperglycemia    Noncompliance with diabetes treatment 07/04/2017   MDD (major depressive disorder), recurrent episode, moderate (HCC)  03/16/2016   Depression 03/24/2015   Maladaptive health behaviors affecting medical condition 03/24/2015   Poor social situation 07/20/2013   Poorly controlled type 1 diabetes mellitus (HCC) 07/07/2013   ADHD (attention deficit hyperactivity disorder) 12/09/2010    Allergies:  Allergies  Allergen Reactions   Latex Itching, Swelling and Other (See Comments)    Skin irritation   Tape Other (See Comments)    Skin irritation   Shellfish Allergy Itching and Rash    Scallops specifically   Medications:  Current Outpatient Medications:    Continuous Glucose Sensor (DEXCOM G7 SENSOR) MISC, Inject 1 Application into the skin as directed., Disp: 3 each, Rfl: 3   escitalopram (LEXAPRO) 20 MG tablet, Take 20 mg by mouth daily as needed., Disp: , Rfl:    ferrous sulfate 324 MG TBEC, Take 1 tablet (324 mg total) by mouth every other day., Disp: 90 tablet, Rfl: 1   Glucagon (BAQSIMI TWO PACK) 3 MG/DOSE POWD, Place 3 mg into the nose as needed (low blood sugar)., Disp: , Rfl:    ibuprofen (ADVIL) 800 MG tablet, Take 800 mg by mouth every 6 (six) hours as needed., Disp: , Rfl:    Insulin Aspart FlexPen (NOVOLOG) 100 UNIT/ML, Inject 0-50 Units into the skin See admin instructions. INJECT 1 UNIT FOR EVERY 20G OF CARBS. PLUS 1 UNIT FOR EVERY 50 ABOVE 150 MG/DL PREMEALS. MAX DOSAGE 50U/DAY (Patient taking differently: Inject 0-50 Units into the skin See admin instructions. INJECT 1 UNIT FOR EVERY 25G OF CARBS. PLUS 1 UNIT FOR EVERY 50 ABOVE 150 MG/DL PREMEALS. MAX DOSAGE 50U/DAY), Disp: , Rfl:    insulin degludec ([name]) 100 UNIT/ML FlexTouch Pen, Inject 30 Units into the skin daily. Per your Endocrinologist, increase by 2 units every 3 days until fasting BG is 200mg /dL or less, Disp: , Rfl:    medroxyPROGESTERone (DEPO-PROVERA) 150 MG/ML injection, Inject 150 mg into the muscle every 3 (three) months. (Patient not taking: Reported on 12/16/2022), Disp: , Rfl:    norgestimate-ethinyl estradiol (SPRINTEC 28)  0.25-35 MG-MCG tablet, Take 1 tablet by mouth daily. (Patient not taking: Reported on 12/16/2022), Disp: 28 tablet, Rfl: 11   oxyCODONE-acetaminophen (PERCOCET/ROXICET) 5-325 MG tablet, Take 1 tablet by mouth every 6 (six) hours as needed for severe pain (pain score 7-10)., Disp: 15 tablet, Rfl: 0   SUMAtriptan (IMITREX) 25 MG tablet, Take 0.5 tablets (12.5 mg total) by mouth every 2 (two) hours as needed for migraine. May repeat in 2 hours if headache persists or recurs., Disp: 10 tablet, Rfl: 0   traMADol (ULTRAM) 50 MG tablet, Take 1 tablet (50 mg total) by mouth every 6 (six) hours as needed for severe pain (pain score 7-10) or moderate pain (pain score 4-6)., Disp: 20 tablet, Rfl: 0   Vitamin D, Ergocalciferol, (DRISDOL) 1.25 MG (50000 UNIT) CAPS capsule, Take 50,000 Units by mouth every 7 (seven) days., Disp: , Rfl:   Observations/Objective: Patient is well-developed, well-nourished in no acute distress.  Resting comfortably at home.  Head is normocephalic, atraumatic.  No labored breathing.  Speech is clear and coherent with logical content.  Patient  is alert and oriented at baseline.    Assessment and Plan: There are no diagnoses linked to this encounter. - Triamcinolone cream to apply to irritated area - Keep all skin clean and dry - Use non-adherent gauze pad - Discussed using Coban or an elastic bandage instead of adhesive tape - Can try to not use anything when at home and relaxing - Seek in person evaluation if symptoms continue to worsen  Follow Up Instructions: I discussed the assessment and treatment plan with the patient. The patient was provided an opportunity to ask questions and all were answered. The patient agreed with the plan and demonstrated an understanding of the instructions.  A copy of instructions were sent to the patient via MyChart unless otherwise noted below.    The patient was advised to call back or seek an in-person evaluation if the symptoms worsen or  if the condition fails to improve as anticipated.    [name], PA-C

## 2023-01-27 ENCOUNTER — Telehealth: Payer: Self-pay

## 2023-01-27 MED ORDER — OMNIPOD 5 DEXG7G6 INTRO GEN 5 KIT: PACK | 0 refills | Status: DC

## 2023-01-27 NOTE — Telephone Encounter (Signed)
Patient calls nurse line requesting prescription for Omnipod 5 starter kit.   She states that this needs to be sent to the [facility] in [place], [place], as she has recently moved.   Will forward request to PCP.   Updated preferred pharmacy.   Veronda Prude, RN

## 2023-02-17 ENCOUNTER — Telehealth: Payer: MEDICAID | Admitting: Physician Assistant

## 2023-02-17 DIAGNOSIS — J069 Acute upper respiratory infection, unspecified: Secondary | ICD-10-CM | POA: Diagnosis not present

## 2023-02-17 MED ORDER — FLUTICASONE PROPIONATE 50 MCG/ACT NA SUSP: 2.0000 | Freq: Every day | NASAL | 0 refills | Status: DC

## 2023-02-17 MED ORDER — PSEUDOEPH-BROMPHEN-DM 30-2-10 MG/5ML PO SYRP: 5.0000 mL | ORAL_SOLUTION | Freq: Four times a day (QID) | ORAL | 0 refills | Status: DC | PRN

## 2023-02-17 MED ORDER — ONDANSETRON 4 MG PO TBDP: 4.0000 mg | ORAL_TABLET | Freq: Three times a day (TID) | ORAL | 0 refills | Status: DC | PRN

## 2023-02-17 NOTE — Patient Instructions (Signed)
Dawn [name], thank you for joining [name], PA-C for today's virtual visit.  While this provider is not your primary care provider (PCP), if your PCP is located in our provider database this encounter information will be shared with them immediately following your visit.   A [facility] MyChart account gives you access to today's visit and all your visits, tests, and labs performed at [facility] " click here if you don't have a [facility] MyChart account or go to [email or web address]  Consent: (Patient) Dawn [name] provided verbal consent for this virtual visit at the beginning of the encounter.  Current Medications:  Current Outpatient Medications:    brompheniramine-pseudoephedrine-DM 30-2-10 MG/5ML syrup, Take 5 mLs by mouth 4 (four) times daily as needed., Disp: 120 mL, Rfl: 0   fluticasone (FLONASE) 50 MCG/ACT nasal spray, Place 2 sprays into both nostrils daily., Disp: 16 g, Rfl: 0   ondansetron (ZOFRAN-ODT) 4 MG disintegrating tablet, Take 1 tablet (4 mg total) by mouth every 8 (eight) hours as needed., Disp: 20 tablet, Rfl: 0   Continuous Glucose Sensor (DEXCOM G7 SENSOR) MISC, Inject 1 Application into the skin as directed., Disp: 3 each, Rfl: 3   escitalopram (LEXAPRO) 20 MG tablet, Take 20 mg by mouth daily as needed., Disp: , Rfl:    ferrous sulfate 324 MG TBEC, Take 1 tablet (324 mg total) by mouth every other day., Disp: 90 tablet, Rfl: 1   Glucagon (BAQSIMI TWO PACK) 3 MG/DOSE POWD, Place 3 mg into the nose as needed (low blood sugar)., Disp: , Rfl:    ibuprofen (ADVIL) 800 MG tablet, Take 800 mg by mouth every 6 (six) hours as needed., Disp: , Rfl:    Insulin Aspart FlexPen (NOVOLOG) 100 UNIT/ML, Inject 0-50 Units into the skin See admin instructions. INJECT 1 UNIT FOR EVERY 20G OF CARBS. PLUS 1 UNIT FOR EVERY 50 ABOVE 150 MG/DL PREMEALS. MAX DOSAGE 50U/DAY (Patient taking differently: Inject 0-50 Units into the skin See admin instructions.  INJECT 1 UNIT FOR EVERY 25G OF CARBS. PLUS 1 UNIT FOR EVERY 50 ABOVE 150 MG/DL PREMEALS. MAX DOSAGE 50U/DAY), Disp: , Rfl:    insulin degludec ([name]) 100 UNIT/ML FlexTouch Pen, Inject 30 Units into the skin daily. Per your Endocrinologist, increase by 2 units every 3 days until fasting BG is 200mg /dL or less, Disp: , Rfl:    Insulin Disposable Pump (OMNIPOD 5 DEXG7G6 INTRO GEN 5) KIT, Use as prescribed, Disp: 1 kit, Rfl: 0   medroxyPROGESTERone (DEPO-PROVERA) 150 MG/ML injection, Inject 150 mg into the muscle every 3 (three) months. (Patient not taking: Reported on 12/16/2022), Disp: , Rfl:    norgestimate-ethinyl estradiol (SPRINTEC 28) 0.25-35 MG-MCG tablet, Take 1 tablet by mouth daily. (Patient not taking: Reported on 12/16/2022), Disp: 28 tablet, Rfl: 11   oxyCODONE-acetaminophen (PERCOCET/ROXICET) 5-325 MG tablet, Take 1 tablet by mouth every 6 (six) hours as needed for severe pain (pain score 7-10)., Disp: 15 tablet, Rfl: 0   SUMAtriptan (IMITREX) 25 MG tablet, Take 0.5 tablets (12.5 mg total) by mouth every 2 (two) hours as needed for migraine. May repeat in 2 hours if headache persists or recurs., Disp: 10 tablet, Rfl: 0   traMADol (ULTRAM) 50 MG tablet, Take 1 tablet (50 mg total) by mouth every 6 (six) hours as needed for severe pain (pain score 7-10) or moderate pain (pain score 4-6)., Disp: 20 tablet, Rfl: 0   triamcinolone cream (KENALOG) 0.1 %, Apply 1 Application topically 2 (two) times daily., Disp: 30 g,  Rfl: 0   Vitamin D, Ergocalciferol, (DRISDOL) 1.25 MG (50000 UNIT) CAPS capsule, Take 50,000 Units by mouth every 7 (seven) days., Disp: , Rfl:    Medications ordered in this encounter:  Meds ordered this encounter  Medications   ondansetron (ZOFRAN-ODT) 4 MG disintegrating tablet    Sig: Take 1 tablet (4 mg total) by mouth every 8 (eight) hours as needed.    Dispense:  20 tablet    Refill:  0    Supervising Provider:   Merrilee Jansky [[id number]]   fluticasone (FLONASE) 50  MCG/ACT nasal spray    Sig: Place 2 sprays into both nostrils daily.    Dispense:  16 g    Refill:  0    Supervising Provider:   Merrilee Jansky [[id number]]   brompheniramine-pseudoephedrine-DM 30-2-10 MG/5ML syrup    Sig: Take 5 mLs by mouth 4 (four) times daily as needed.    Dispense:  120 mL    Refill:  0    Supervising Provider:   Merrilee Jansky [[id number]]     *If you need refills on other medications prior to your next appointment, please contact your pharmacy*  Follow-Up: Call back or seek an in-person evaluation if the symptoms worsen or if the condition fails to improve as anticipated.  [facility] [phone]  Other Instructions Viral Respiratory Infection A respiratory infection is an illness that affects part of the respiratory system, such as the lungs, nose, or throat. A respiratory infection that is caused by a virus is called a viral respiratory infection. Common types of viral respiratory infections include: A cold. The flu (influenza). A respiratory syncytial virus (RSV) infection. What are the causes? This condition is caused by a virus. The virus may spread through contact with droplets or direct contact with infected people or their mucus or secretions. The virus may spread from person to person (is contagious). What are the signs or symptoms? Symptoms of this condition include: A stuffy or runny nose. A sore throat or cough. Shortness of breath or difficulty breathing. Yellow or green mucus (sputum). Other symptoms may include: A fever. Sweating or chills. Fatigue. Achy muscles. A headache. How is this diagnosed? This condition may be diagnosed based on: Your symptoms. A physical exam. Testing of secretions from the nose or throat. Chest X-ray. How is this treated? This condition may be treated with medicines, such as: Antiviral medicine. This may shorten the length of time a person has symptoms. Expectorants. These make it  easier to cough up mucus. Decongestant nasal sprays. Acetaminophen or NSAIDs, such as ibuprofen, to relieve fever and pain. Antibiotic medicines are not prescribed for viral infections.This is because antibiotics are designed to kill bacteria. They do not kill viruses. Follow these instructions at home: Managing pain and congestion Take over-the-counter and prescription medicines only as told by your health care provider. If you have a sore throat, gargle with a mixture of salt and water 3-4 times a day or as needed. To make salt water, completely dissolve -1 tsp (3-6 g) of salt in 1 cup (237 mL) of warm water. Use nose drops made from salt water to ease congestion and soften raw skin around your nose. Take 2 tsp (10 mL) of honey at bedtime to lessen coughing at night. Do not give honey to children who are younger than 1 year. Drink enough fluid to keep your urine pale yellow. This helps prevent dehydration and helps loosen up mucus. General instructions  Rest as  much as possible. Do not drink alcohol. Do not use any products that contain nicotine or tobacco. These products include cigarettes, chewing tobacco, and vaping devices, such as e-cigarettes. If you need help quitting, ask your health care provider. Keep all follow-up visits. This is important. How is this prevented?     Get an annual flu shot. You may get the flu shot in late summer, fall, or winter. Ask your health care provider when you should get your flu shot. Avoid spreading your infection to other people. If you are sick: Wash your hands with soap and water often, especially after you cough or sneeze. Wash for at least 20 seconds. If soap and water are not available, use alcohol-based hand sanitizer. Cover your mouth when you cough. Cover your nose and mouth when you sneeze. Do not share cups or eating utensils. Clean commonly used objects often. Clean commonly touched surfaces. Stay home from work or school as told by  your health care provider. Avoid contact with people who are sick during cold and flu season. This is generally fall and winter. Contact a health care provider if: Your symptoms last for 10 days or longer. Your symptoms get worse over time. You have severe sinus pain in your face or forehead. The glands in your jaw or neck become very swollen. You have shortness of breath. Get help right away if you: Feel pain or pressure in your chest. Have trouble breathing. Faint or feel like you will faint. Have severe and persistent vomiting. Feel confused or disoriented. These symptoms may represent a serious problem that is an emergency. Do not wait to see if the symptoms will go away. Get medical help right away. Call your local emergency services (911 in the [place] Do not drive yourself to the hospital. Summary A respiratory infection is an illness that affects part of the respiratory system, such as the lungs, nose, or throat. A respiratory infection that is caused by a virus is called a viral respiratory infection. Common types of viral respiratory infections include a cold, influenza, and respiratory syncytial virus (RSV) infection. Symptoms of this condition include a stuffy or runny nose, cough, fatigue, achy muscles, sore throat, and fevers or chills. Antibiotic medicines are not prescribed for viral infections. This is because antibiotics are designed to kill bacteria. They are not effective against viruses. This information is not intended to replace advice given to you by your health care provider. Make sure you discuss any questions you have with your health care provider. Document Revised: 03/26/2020 Document Reviewed: 03/26/2020 Elsevier Patient Education  2024 [employer]   If you have been instructed to have an in-person evaluation today at a local Urgent Care facility, please use the link below. It will take you to a list of all of our available [facility], including  address, phone number and hours of operation. Please do not delay care.  [facility]  If you or a family member do not have a primary care provider, use the link below to schedule a visit and establish care. When you choose a [facility] primary care physician or advanced practice provider, you gain a long-term partner in health. Find a Primary Care Provider  Learn more about [facility]'s in-office and virtual care options: [facility] - Get Care Now

## 2023-02-17 NOTE — Progress Notes (Signed)
Virtual Visit Consent   Dot Splinter, you are scheduled for a virtual visit with a [facility] provider today. Just as with appointments in the office, your consent must be obtained to participate. Your consent will be active for this visit and any virtual visit you may have with one of our providers in the next 365 days. If you have a MyChart account, a copy of this consent can be sent to you electronically.  As this is a virtual visit, video technology does not allow for your provider to perform a traditional examination. This may limit your provider's ability to fully assess your condition. If your provider identifies any concerns that need to be evaluated in person or the need to arrange testing (such as labs, EKG, etc.), we will make arrangements to do so. Although advances in technology are sophisticated, we cannot ensure that it will always work on either your end or our end. If the connection with a video visit is poor, the visit may have to be switched to a telephone visit. With either a video or telephone visit, we are not always able to ensure that we have a secure connection.  By engaging in this virtual visit, you consent to the provision of healthcare and authorize for your insurance to be billed (if applicable) for the services provided during this visit. Depending on your insurance coverage, you may receive a charge related to this service.  I need to obtain your verbal consent now. Are you willing to proceed with your visit today? Dawn [name] has provided verbal consent on 02/17/2023 for a virtual visit (video or telephone). [name], PA-C  Date: 02/17/2023 1:53 PM   Virtual Visit via Video Note   I, [name], connected with  Dawn [name]  ([id number], 22/02/2000) on 02/17/23 at  1:45 PM EST by a video-enabled telemedicine application and verified that I am speaking with the correct person using two identifiers.  Location: Patient: Virtual Visit Location  Patient: Home Provider: Virtual Visit Location Provider: Home Office   I discussed the limitations of evaluation and management by telemedicine and the availability of in person appointments. The patient expressed understanding and agreed to proceed.    History of Present Illness: Dawn [name] is a 23 y.o. who identifies as a female who was assigned female at birth, and is being seen today for URI symptoms.  HPI: URI  This is a new problem. The current episode started yesterday. The problem has been gradually improving. There has been no fever. Associated symptoms include congestion, coughing, diarrhea, headaches, nausea, rhinorrhea, sinus pain, a sore throat and vomiting (this morning once). Pertinent negatives include no chest pain, ear pain or plugged ear sensation. Associated symptoms comments: Chills. Treatments tried: nyquil, theraflu, halls, hot tea. The treatment provided no relief.     Problems:  Patient Active Problem List   Diagnosis Date Noted   DKA (diabetic ketoacidosis) (HCC) 12/25/2022   Pelvic pain 12/16/2022   Screening examination for STI 12/15/2022   Contraception management 12/15/2022   COVID-19 10/02/2022   Sinus tachycardia 08/12/2022   Chronic musculoskeletal pain 08/03/2022   Sepsis (HCC) 03/28/2022   Housing insecurity 08/09/2021   Menstrual periods irregular 06/17/2021   Diabetic ketoacidosis without coma associated with type 1 diabetes mellitus (HCC) 04/10/2021   Tension headache, chronic 12/24/2019   Hx of migraines 07/11/2019   Protein-calorie malnutrition, severe 06/25/2019   Asthma 09/25/2018   Hyperglycemia    Noncompliance with diabetes treatment 07/04/2017   MDD (major  depressive disorder), recurrent episode, moderate (HCC) 03/16/2016   Depression 03/24/2015   Maladaptive health behaviors affecting medical condition 03/24/2015   Poor social situation 07/20/2013   Poorly controlled type 1 diabetes mellitus (HCC) 07/07/2013   ADHD (attention  deficit hyperactivity disorder) 12/09/2010    Allergies:  Allergies  Allergen Reactions   Latex Itching, Swelling and Other (See Comments)    Skin irritation   Tape Other (See Comments)    Skin irritation   Shellfish Allergy Itching and Rash    Scallops specifically   Medications:  Current Outpatient Medications:    brompheniramine-pseudoephedrine-DM 30-2-10 MG/5ML syrup, Take 5 mLs by mouth 4 (four) times daily as needed., Disp: 120 mL, Rfl: 0   Continuous Glucose Sensor (DEXCOM G7 SENSOR) MISC, Inject 1 Application into the skin as directed., Disp: 3 each, Rfl: 3   escitalopram (LEXAPRO) 20 MG tablet, Take 20 mg by mouth daily as needed., Disp: , Rfl:    ferrous sulfate 324 MG TBEC, Take 1 tablet (324 mg total) by mouth every other day., Disp: 90 tablet, Rfl: 1   fluticasone (FLONASE) 50 MCG/ACT nasal spray, Place 2 sprays into both nostrils daily., Disp: 16 g, Rfl: 0   Glucagon (BAQSIMI TWO PACK) 3 MG/DOSE POWD, Place 3 mg into the nose as needed (low blood sugar)., Disp: , Rfl:    ibuprofen (ADVIL) 800 MG tablet, Take 800 mg by mouth every 6 (six) hours as needed., Disp: , Rfl:    Insulin Aspart FlexPen (NOVOLOG) 100 UNIT/ML, Inject 0-50 Units into the skin See admin instructions. INJECT 1 UNIT FOR EVERY 20G OF CARBS. PLUS 1 UNIT FOR EVERY 50 ABOVE 150 MG/DL PREMEALS. MAX DOSAGE 50U/DAY (Patient taking differently: Inject 0-50 Units into the skin See admin instructions. INJECT 1 UNIT FOR EVERY 25G OF CARBS. PLUS 1 UNIT FOR EVERY 50 ABOVE 150 MG/DL PREMEALS. MAX DOSAGE 50U/DAY), Disp: , Rfl:    insulin degludec ([name]) 100 UNIT/ML FlexTouch Pen, Inject 30 Units into the skin daily. Per your Endocrinologist, increase by 2 units every 3 days until fasting BG is 200mg /dL or less, Disp: , Rfl:    Insulin Disposable Pump (OMNIPOD 5 DEXG7G6 INTRO GEN 5) KIT, Use as prescribed, Disp: 1 kit, Rfl: 0   medroxyPROGESTERone (DEPO-PROVERA) 150 MG/ML injection, Inject 150 mg into the muscle every 3  (three) months. (Patient not taking: Reported on 12/16/2022), Disp: , Rfl:    norgestimate-ethinyl estradiol (SPRINTEC 28) 0.25-35 MG-MCG tablet, Take 1 tablet by mouth daily. (Patient not taking: Reported on 12/16/2022), Disp: 28 tablet, Rfl: 11   ondansetron (ZOFRAN-ODT) 4 MG disintegrating tablet, Take 1 tablet (4 mg total) by mouth every 8 (eight) hours as needed., Disp: 20 tablet, Rfl: 0   oxyCODONE-acetaminophen (PERCOCET/ROXICET) 5-325 MG tablet, Take 1 tablet by mouth every 6 (six) hours as needed for severe pain (pain score 7-10)., Disp: 15 tablet, Rfl: 0   SUMAtriptan (IMITREX) 25 MG tablet, Take 0.5 tablets (12.5 mg total) by mouth every 2 (two) hours as needed for migraine. May repeat in 2 hours if headache persists or recurs., Disp: 10 tablet, Rfl: 0   traMADol (ULTRAM) 50 MG tablet, Take 1 tablet (50 mg total) by mouth every 6 (six) hours as needed for severe pain (pain score 7-10) or moderate pain (pain score 4-6)., Disp: 20 tablet, Rfl: 0   triamcinolone cream (KENALOG) 0.1 %, Apply 1 Application topically 2 (two) times daily., Disp: 30 g, Rfl: 0   Vitamin D, Ergocalciferol, (DRISDOL) 1.25 MG (50000 UNIT) CAPS  capsule, Take 50,000 Units by mouth every 7 (seven) days., Disp: , Rfl:   Observations/Objective: Patient is well-developed, well-nourished in no acute distress.  Resting comfortably at home.  Head is normocephalic, atraumatic.  No labored breathing.  Speech is clear and coherent with logical content.  Patient is alert and oriented at baseline.    Assessment and Plan: 1. Viral URI with cough (Primary) - ondansetron (ZOFRAN-ODT) 4 MG disintegrating tablet; Take 1 tablet (4 mg total) by mouth every 8 (eight) hours as needed.  Dispense: 20 tablet; Refill: 0 - fluticasone (FLONASE) 50 MCG/ACT nasal spray; Place 2 sprays into both nostrils daily.  Dispense: 16 g; Refill: 0 - brompheniramine-pseudoephedrine-DM 30-2-10 MG/5ML syrup; Take 5 mLs by mouth 4 (four) times daily as  needed.  Dispense: 120 mL; Refill: 0  - Suspect viral URI - Symptomatic medications of choice over the counter as needed - Add Bromfed DM for cough - Flonase for nasal congestion and drainage - Zofran for nausea - Push fluids - Rest - Seek further evaluation if symptoms change or worsen   Follow Up Instructions: I discussed the assessment and treatment plan with the patient. The patient was provided an opportunity to ask questions and all were answered. The patient agreed with the plan and demonstrated an understanding of the instructions.  A copy of instructions were sent to the patient via MyChart unless otherwise noted below.    The patient was advised to call back or seek an in-person evaluation if the symptoms worsen or if the condition fails to improve as anticipated.    [name], PA-C

## 2023-02-20 ENCOUNTER — Encounter: Payer: Self-pay | Admitting: Family Medicine

## 2023-02-20 NOTE — Progress Notes (Signed)
PCP recommended dismissal from the practice due to multiple missed appointments. The dismissal letter was printed and handed over to Vea to process as a certified letter. I verified the names and addresses on the letters and envelopes for each patient and advised Vea to do the same before mailing the letters.

## 2023-02-20 NOTE — Progress Notes (Signed)
PCP requested dismissal for multiple no-shows to appointments.

## 2023-03-20 ENCOUNTER — Telehealth: Payer: Self-pay

## 2023-03-20 NOTE — Telephone Encounter (Signed)
 Received phone call from Jeri Cos, RN case manager with [facility].   She wanted Dr. Melissa Noon to be aware of patient's recent hospitalization for uncontrolled diabetes. She reports that patient is establishing with new [facility] PCP on 4/29 and has Endocrinologist appointment on 03/31/23.  RN does report that patient had a low CBG reading earlier today that was 55. She drank juice that increased level to 70, she then ate a meal and blood glucose increased to 130's.   Patient was feeling better after these measures.   Dr. Melissa Noon- I was unsure if patient needed to be scheduled with our office as it looks like she received a dismissal notice.   If you have further questions for RN case manager, she can be reached at [phone].  Veronda Prude, RN

## 2023-03-31 NOTE — ED Provider Notes (Signed)
 [facility] EMERGENCY DEPT  ED Provider Note History   Chief Complaint  Patient presents with  . Hyperglycemia  . Chest Pain   History of Present Illness HPI 23 year old female with a history of insulin -dependent diabetes, ADHD, prior admissions for DKA -here with elevated sugars and feeling poorly for at least 2 days.  Patient is currently a poor historian and does not provide much in the way of answers to my questions.  It seems that she removed her insulin  pump this morning  because it was time and did not replace it because  it can cause nausea.  She tells me that her sugars were reading high.  She has been having nausea/vomiting as well as diffuse abdominal pain and chest pain.  She denies fevers.  She denies cough.  My evaluation, the patient did have an episode of nausea/vomiting with dark liquid emesis.  Not coffee ground but consider possible hematic emesis. Past Medical History:  Diagnosis Date  . ADHD (attention deficit hyperactivity disorder) 12/09/2010  . Diabetes mellitus type 2, uncomplicated (CMS/HHS-HCC)    Past Surgical History:  Procedure Laterality Date  . ankle surger Left   . leg surgery Left   . surgery foot Left    Family History  Problem Relation Age of Onset  . Stroke Father   . High blood pressure (Hypertension) Father   . High blood pressure (Hypertension) Maternal Aunt   . Stroke Paternal Grandmother   . Diabetes Paternal Grandmother   . High blood pressure (Hypertension) Paternal Grandmother   . Asthma Other   . Diabetes Other   . Cancer Other   . High blood pressure (Hypertension) Other    Social History   Socioeconomic History  . Marital status: Single  . Number of children: 0  . Years of education: 60  . Highest education level: Some college, no degree  Occupational History  . Occupation: [employer] in [employer]  Tobacco Use  . Smoking status: Former    Types: Cigarettes  . Smokeless tobacco: Never  Substance and Sexual Activity  .  Alcohol use: Not Currently  . Drug use: Never  . Sexual activity: Not Currently  Social History Narrative   03/02/23 Patient currently lives in an apartment with sister and sisters children.    Social Drivers of Health   Financial Resource Strain: Medium Risk (03/20/2023)   Overall Financial Resource Strain (CARDIA)   . Difficulty of Paying Living Expenses: Somewhat hard  Food Insecurity: No Food Insecurity (03/20/2023)   Hunger Vital Sign   . Worried About Programme researcher, broadcasting/film/video in the Last Year: Never true   . Ran Out of Food in the Last Year: Never true  Recent Concern: Food Insecurity - Food Insecurity Present (03/02/2023)   Hunger Vital Sign   . Worried About Programme researcher, broadcasting/film/video in the Last Year: Sometimes true   . Ran Out of Food in the Last Year: Sometimes true  Transportation Needs: No Transportation Needs (03/20/2023)   PRAPARE - Transportation   . Lack of Transportation (Medical): No   . Lack of Transportation (Non-Medical): No  Recent Concern: Transportation Needs - Unmet Transportation Needs (03/02/2023)   PRAPARE - Transportation   . Lack of Transportation (Medical): Yes   . Lack of Transportation (Non-Medical): Yes  Physical Activity: Unknown (03/02/2023)   Exercise Vital Sign   . Days of Exercise per Week: 4 days   . Minutes of Exercise per Session: Patient unable to answer  Stress: No Stress Concern Present (  03/24/2023)   [employer] of Occupational Health - Occupational Stress Questionnaire   . Feeling of Stress : Not at all  Social Connections: Socially Isolated (03/02/2023)   Social Connection and Isolation Panel [NHANES]   . Frequency of Communication with Friends and Family: More than three times a week   . Frequency of Social Gatherings with Friends and Family: Once a week   . Attends Religious Services: Never   . Active Member of Clubs or Organizations: No   . Attends Banker Meetings: Never   . Marital Status: Never married  Housing Stability:  High Risk (03/14/2023)   Housing Stability Vital Sign   . Unable to Pay for Housing in the Last Year: No   . Number of Times Moved in the Last Year: 10   . Homeless in the Last Year: No   Review of Systems  Constitutional:  Positive for fatigue.  Cardiovascular:  Positive for chest pain.  Gastrointestinal:  Positive for abdominal pain, nausea and vomiting.  Endocrine: Positive for polyuria.  Neurological:  Negative for syncope.  Psychiatric/Behavioral:  Positive for confusion.     Physical Exam  BP 116/70 (BP Location: Right upper arm, Patient Position: Sitting)   Pulse (!) 130   Temp 36.7 C (98 F) (Tympanic)   Resp (!) 30   LMP  (LMP Unknown) Comment: about month ago  SpO2 99%  Physical Exam Exam: BP 109/76 (BP Location: Right forearm, Patient Position: Lying, BP Cuff Size: Small Adult)   Pulse 101   Temp 36.9 C (98.4 F) (Oral)   Resp 16   Wt 50.4 kg (111 lb 1.8 oz)   LMP  (LMP Unknown) Comment: about month ago  SpO2 99%   BMI 21.70 kg/m  GENERAL:  VS reviewed.  Pt is breathing rapidly and sometimes answers questions. She appears unwell.   Head: Normocephalic no obvious trauma Pupils: round reactive ENT: The neck is supple  Airway is patent.  Pt handling secretions. PULMONARY: Currently in no acute respiratory distress.  Rapid breathing rate, non labored respirations. CIRCULATORY: tachycardic. Regular. . ABDOMEN: The abdomen is soft and non-tender to palpation. NEUROLOGIC: AO.  No abnormal movement or seizures, face symmetrical.  Equal smile and eye movement.  No problems swallowing or handling secretions.  MUSCULOSKELETAL:   No edema.  Good distal color. SKIN:  No obvious rash.  Warm.  Dry to touch. PSYCHIATRIC: difficult to assess.  Denies SI.   Procedures  Procedures   Medical Decision Making   Assessment and Plan:     Labs, imaging, IVF, Insulin , electrolyte correction, assess for infectious causes.  Anticipate need for admission to MICU.  Differential  Diagnosis:    In addition to the differential diagnoses listed, other diagnoses were considered.      Suspect severe DKA. Consider medication non compliance, infection, pump malfunction as the most likely causes.  Will assess for metabolic derangement and hydrate aggressively.  Will prioritize IV access, labs, IVF with close monitoring and frequent re-assessments.   Medical Complexity:    New and requires workup.     Pertinent labs & imaging results that were available during my care of the patient were reviewed by me and considered in my medical decision making.     I reviewed previous medical records.     I independently visualized image(s), tracing(s), and/or specimen(s).     I discussed the patient with another provider.       Plan: Diagnostic: Orders Placed This Encounter  Procedures  .  X-ray chest single view portable  . Magnesium   . Phosphorus  . Complete Blood Count (CBC) with Differential  . Beta-Hydroxybutyrate  . Comprehensive Metabolic Panel (CMP)  . Toxicology (Drug) Screen, Serum  . Shock Panel, Venous  . Lactate, Repeat  . Basic Metabolic Panel (BMP)  . Nursing: If insulin  gtt rate exceeds ordered dose range, notify provider to consider Endocrine Consult.  . Hypoglycemia Protocol  . Notify Provider Consulting civil engineer)  . POC Urine HCG Pregnancy Test  . POC GLUCOSE  . POC GLUCOSE  . ECG 12-lead  . ECG 12-lead  . Peripheral IV Access, Obtain & Maintain- Adult  . Communication to Vascular Access Team: Insert PIV (Adult)    Therapeutic: Medications  sodium chloride  0.9% flush inj syringe 5 mL (has no administration in time range)  sodium chloride  0.9 % bolus 1,000 mL (1,000 mLs Intravenous New Bag 03/31/23 2117)  lactated ringers  bolus 1,000 mL (1,000 mLs Intravenous New Bag 03/31/23 2126)  calcium  gluconate 100 mg/mL (10%) injection 1 g (1 g Intravenous Given 03/31/23 2134)  dextrose  50% in water  solution 12.5-25 g (has no administration in time range)  glucagon   (GLUCAGEN ) injection 1 mg (has no administration in time range)  insulin  REGULAR human (MYXREDLIN ) in sodium chloride  0.9% infusion 100 units/100 mL (has no administration in time range)  metoclopramide  (REGLAN ) injection 10 mg (10 mg Intravenous Given 03/31/23 2111)  pantoprazole  (PROTONIX ) injection 40 mg (40 mg Intravenous Given 03/31/23 2110)  morphine  IV syringe 4 mg (4 mg Intravenous Given 03/31/23 2121)  sodium bicarbonate  1 mEq/mL (8.4 %) injection 50 mEq (50 mEq Intravenous Given 03/31/23 2134)    Data: Labs Reviewed  SHOCK PANEL, VENOUS - Abnormal; Notable for the following components:      Result Value   pH, Venous 6.96 (*)    PCO2, Venous 23 (*)    Base Excess, Venous -25 (*)    Bicarbonate, Venous 5 (*)    CO2 TOTAL, VENOUS 6 (*)    Hemoglobin, Venous 11.1 (*)    Hematocrit, Venous 33.0 (*)    Sodium, Venous 131 (*)    Potassium, Venous 8.0 (*)    Glucose, Nonfasting, Venous >720 (*)    Calcium , Ionized 1.36 (*)    Lactate, Venous 3.9 (*)    All other components within normal limits  POC GLUCOSE - Abnormal; Notable for the following components:   POC Glucose >600 (*)    All other components within normal limits   Narrative:    POC TEST(S) ABOVE PERFORMED AT THE PATIENT CARE LOCATION AND OVERSEEN BY THE [facility] POCT PROGRAM.  MAGNESIUM   PHOSPHORUS  COMPLETE BLOOD COUNT (CBC) WITH DIFFERENTIAL  BETA-HYDROXYBUTYRATE  COMPREHENSIVE METABOLIC PANEL (CMP)  TOXICOLOGY(DRUG) SCREEN, SERUM  LACTATE, REPEAT  BASIC METABOLIC PANEL (BMP)  POC URINE HCG PREGNANCY TEST  POC GLUCOSE  POC GLUCOSE  POC GLUCOSE  POC GLUCOSE     EKG: Sinus tachycardia with peaked T waves.  Suspect hyperglycemia in the setting of DKA  Shock panel concerning for an elevated glucose greater than 720, lactate of 3.9, pH of 7.96, potassium of 8. -There was a significant difficulty getting access but the patient now has 2 ultrasound-guided IVs and is getting 2 L of IV fluids in addition to an insulin   drip.  She is getting calcium  gluconate and bicarb.    3rd liter of IVF initiated.  HR is improving.    10:57 PM  given pts acute renal dysfunction, will slow the insulin  drip down for now  and get repeat BMP.  Will discuss with MICU.    Spoke with MICU who agrees with current management and will admit for ongoing care.   All results and plan of care have been reviewed with patient who is agreeable and expresses understanding of plan. All questions have been answered to the patients satisfaction.  Case discussed with attending who has seen and evaluated the patient and agrees with plan of care.      ED Clinical Impression  1. Chest pain, unspecified type                 Alton Prentice Lenis, [place] 04/01/23 2209

## 2023-04-09 ENCOUNTER — Other Ambulatory Visit: Payer: Self-pay

## 2023-04-09 ENCOUNTER — Encounter (HOSPITAL_COMMUNITY): Payer: Self-pay

## 2023-04-09 ENCOUNTER — Inpatient Hospital Stay (HOSPITAL_COMMUNITY): Payer: MEDICAID

## 2023-04-09 ENCOUNTER — Inpatient Hospital Stay (HOSPITAL_COMMUNITY)
Admission: EM | Admit: 2023-04-09 | Discharge: 2023-04-11 | DRG: 637 | Disposition: A | Discharge status: Home / Self Care | Payer: MEDICAID | Attending: Pulmonary Disease | Admitting: Pulmonary Disease

## 2023-04-09 ENCOUNTER — Emergency Department (HOSPITAL_COMMUNITY): Payer: MEDICAID

## 2023-04-09 DIAGNOSIS — E870 Hyperosmolality and hypernatremia: Secondary | ICD-10-CM | POA: Diagnosis present

## 2023-04-09 DIAGNOSIS — E1110 Type 2 diabetes mellitus with ketoacidosis without coma: Secondary | ICD-10-CM | POA: Diagnosis present

## 2023-04-09 DIAGNOSIS — E875 Hyperkalemia: Secondary | ICD-10-CM | POA: Diagnosis present

## 2023-04-09 DIAGNOSIS — R9431 Abnormal electrocardiogram [ECG] [EKG]: Secondary | ICD-10-CM | POA: Diagnosis not present

## 2023-04-09 DIAGNOSIS — G9341 Metabolic encephalopathy: Secondary | ICD-10-CM | POA: Diagnosis present

## 2023-04-09 DIAGNOSIS — Z91013 Allergy to seafood: Secondary | ICD-10-CM

## 2023-04-09 DIAGNOSIS — Z833 Family history of diabetes mellitus: Secondary | ICD-10-CM

## 2023-04-09 DIAGNOSIS — Z91148 Patient's other noncompliance with medication regimen for other reason: Secondary | ICD-10-CM | POA: Diagnosis not present

## 2023-04-09 DIAGNOSIS — N179 Acute kidney failure, unspecified: Secondary | ICD-10-CM | POA: Diagnosis present

## 2023-04-09 DIAGNOSIS — T383X6A Underdosing of insulin and oral hypoglycemic [antidiabetic] drugs, initial encounter: Secondary | ICD-10-CM | POA: Diagnosis present

## 2023-04-09 DIAGNOSIS — E860 Dehydration: Secondary | ICD-10-CM | POA: Diagnosis present

## 2023-04-09 DIAGNOSIS — Z91040 Latex allergy status: Secondary | ICD-10-CM

## 2023-04-09 DIAGNOSIS — E1010 Type 1 diabetes mellitus with ketoacidosis without coma: Principal | ICD-10-CM | POA: Diagnosis present

## 2023-04-09 DIAGNOSIS — Z79899 Other long term (current) drug therapy: Secondary | ICD-10-CM

## 2023-04-09 DIAGNOSIS — Z794 Long term (current) use of insulin: Secondary | ICD-10-CM | POA: Diagnosis not present

## 2023-04-09 DIAGNOSIS — Z91048 Other nonmedicinal substance allergy status: Secondary | ICD-10-CM

## 2023-04-09 LAB — CBC WITH DIFFERENTIAL/PLATELET
Abs Immature Granulocytes: 1.4 10*3/uL — ABNORMAL HIGH (ref 0.00–0.07)
Basophils Absolute: 1 10*3/uL — ABNORMAL HIGH (ref 0.0–0.1)
Basophils Relative: 4 %
Eosinophils Absolute: 0.2 10*3/uL (ref 0.0–0.5)
Eosinophils Relative: 1 %
HCT: 38.3 % (ref 36.0–46.0)
Hemoglobin: 9.8 g/dL — ABNORMAL LOW (ref 12.0–15.0)
Lymphocytes Relative: 18 %
Lymphs Abs: 4.3 10*3/uL — ABNORMAL HIGH (ref 0.7–4.0)
MCH: 25.1 pg — ABNORMAL LOW (ref 26.0–34.0)
MCHC: 25.6 g/dL — ABNORMAL LOW (ref 30.0–36.0)
MCV: 98.2 fL (ref 80.0–100.0)
Metamyelocytes Relative: 4 %
Monocytes Absolute: 2.4 10*3/uL — ABNORMAL HIGH (ref 0.1–1.0)
Monocytes Relative: 10 %
Myelocytes: 2 %
Neutro Abs: 14.7 10*3/uL — ABNORMAL HIGH (ref 1.7–7.7)
Neutrophils Relative %: 61 %
Platelets: 592 10*3/uL — ABNORMAL HIGH (ref 150–400)
RBC: 3.9 MIL/uL (ref 3.87–5.11)
RDW: 18.2 % — ABNORMAL HIGH (ref 11.5–15.5)
WBC: 24.1 10*3/uL — ABNORMAL HIGH (ref 4.0–10.5)
nRBC: 0.2 % (ref 0.0–0.2)
nRBC: 1 /100{WBCs} — ABNORMAL HIGH

## 2023-04-09 LAB — MAGNESIUM
Magnesium: 2.3 mg/dL (ref 1.7–2.4)
Magnesium: 2.1 mg/dL (ref 1.7–2.4)

## 2023-04-09 LAB — URINALYSIS, ROUTINE W REFLEX MICROSCOPIC
Bilirubin Urine: NEGATIVE
Glucose, UA: >=500 mg/dL — AB
Ketones, ur: 80 mg/dL — AB
Leukocytes,Ua: NEGATIVE
Nitrite: NEGATIVE
Protein, ur: 30 mg/dL — AB
Specific Gravity, Urine: 1.015 (ref 1.005–1.030)
pH: 5 (ref 5.0–8.0)

## 2023-04-09 LAB — BASIC METABOLIC PANEL WITH GFR
Anion gap: 19 — ABNORMAL HIGH (ref 5–15)
Anion gap: 15 (ref 5–15)
BUN: 48 mg/dL — ABNORMAL HIGH (ref 6–20)
BUN: 36 mg/dL — ABNORMAL HIGH (ref 6–20)
BUN: 31 mg/dL — ABNORMAL HIGH (ref 6–20)
CO2: <7 mmol/L — ABNORMAL LOW (ref 22–32)
CO2: 9 mmol/L — ABNORMAL LOW (ref 22–32)
CO2: 15 mmol/L — ABNORMAL LOW (ref 22–32)
Calcium: 9.7 mg/dL (ref 8.9–10.3)
Calcium: 9.5 mg/dL (ref 8.9–10.3)
Calcium: 9.2 mg/dL (ref 8.9–10.3)
Chloride: 99 mmol/L (ref 98–111)
Chloride: 120 mmol/L — ABNORMAL HIGH (ref 98–111)
Chloride: 122 mmol/L — ABNORMAL HIGH (ref 98–111)
Creatinine, Ser: 2.66 mg/dL — ABNORMAL HIGH (ref 0.44–1.00)
Creatinine, Ser: 2.09 mg/dL — ABNORMAL HIGH (ref 0.44–1.00)
Creatinine, Ser: 1.58 mg/dL — ABNORMAL HIGH (ref 0.44–1.00)
GFR, Estimated: 25 mL/min — ABNORMAL LOW (ref >60)
GFR, Estimated: 34 mL/min — ABNORMAL LOW (ref >60)
GFR, Estimated: 47 mL/min — ABNORMAL LOW (ref >60)
Glucose, Bld: 1160 mg/dL (ref 70–99)
Glucose, Bld: 421 mg/dL — ABNORMAL HIGH (ref 70–99)
Glucose, Bld: 194 mg/dL — ABNORMAL HIGH (ref 70–99)
Potassium: >7.5 mmol/L (ref 3.5–5.1)
Potassium: 4.2 mmol/L (ref 3.5–5.1)
Potassium: 4 mmol/L (ref 3.5–5.1)
Sodium: 136 mmol/L (ref 135–145)
Sodium: 148 mmol/L — ABNORMAL HIGH (ref 135–145)
Sodium: 152 mmol/L — ABNORMAL HIGH (ref 135–145)

## 2023-04-09 LAB — CBG MONITORING, ED
Glucose-Capillary: >600 mg/dL (ref 70–99)
Glucose-Capillary: >600 mg/dL (ref 70–99)
Glucose-Capillary: >600 mg/dL (ref 70–99)
Glucose-Capillary: >600 mg/dL (ref 70–99)
Glucose-Capillary: >600 mg/dL (ref 70–99)

## 2023-04-09 LAB — I-STAT VENOUS BLOOD GAS, ED
Acid-base deficit: 30 mmol/L — ABNORMAL HIGH (ref 0.0–2.0)
Bicarbonate: 3 mmol/L — ABNORMAL LOW (ref 20.0–28.0)
Calcium, Ion: 1.23 mmol/L (ref 1.15–1.40)
HCT: 28 % — ABNORMAL LOW (ref 36.0–46.0)
Hemoglobin: 9.5 g/dL — ABNORMAL LOW (ref 12.0–15.0)
O2 Saturation: 55 %
Potassium: 8.4 mmol/L (ref 3.5–5.1)
Sodium: 135 mmol/L (ref 135–145)
TCO2: <5 mmol/L — ABNORMAL LOW (ref 22–32)
pCO2, Ven: 18.3 mmHg — CL (ref 44–60)
pH, Ven: 6.822 — CL (ref 7.25–7.43)
pO2, Ven: 51 mmHg — ABNORMAL HIGH (ref 32–45)

## 2023-04-09 LAB — GLUCOSE, CAPILLARY
Glucose-Capillary: 163 mg/dL — ABNORMAL HIGH (ref 70–99)
Glucose-Capillary: 187 mg/dL — ABNORMAL HIGH (ref 70–99)
Glucose-Capillary: 206 mg/dL — ABNORMAL HIGH (ref 70–99)
Glucose-Capillary: 258 mg/dL — ABNORMAL HIGH (ref 70–99)
Glucose-Capillary: 333 mg/dL — ABNORMAL HIGH (ref 70–99)
Glucose-Capillary: 394 mg/dL — ABNORMAL HIGH (ref 70–99)
Glucose-Capillary: 491 mg/dL — ABNORMAL HIGH (ref 70–99)
Glucose-Capillary: 512 mg/dL (ref 70–99)
Glucose-Capillary: 546 mg/dL (ref 70–99)
Glucose-Capillary: >600 mg/dL (ref 70–99)
Glucose-Capillary: >600 mg/dL (ref 70–99)
Glucose-Capillary: >600 mg/dL (ref 70–99)

## 2023-04-09 LAB — TROPONIN I (HIGH SENSITIVITY): Troponin I (High Sensitivity): 12 ng/L (ref <18)

## 2023-04-09 LAB — BETA-HYDROXYBUTYRIC ACID
Beta-Hydroxybutyric Acid: >8 mmol/L — ABNORMAL HIGH (ref 0.05–0.27)
Beta-Hydroxybutyric Acid: 7.22 mmol/L — ABNORMAL HIGH (ref 0.05–0.27)

## 2023-04-09 LAB — ETHANOL: Alcohol, Ethyl (B): <10 mg/dL (ref <10)

## 2023-04-09 LAB — RAPID URINE DRUG SCREEN, HOSP PERFORMED
Amphetamines: NOT DETECTED
Barbiturates: NOT DETECTED
Benzodiazepines: NOT DETECTED
Cocaine: NOT DETECTED
Opiates: NOT DETECTED
Tetrahydrocannabinol: NOT DETECTED

## 2023-04-09 LAB — MRSA NEXT GEN BY PCR, NASAL: MRSA by PCR Next Gen: DETECTED — AB

## 2023-04-09 LAB — HCG, SERUM, QUALITATIVE: Preg, Serum: NEGATIVE

## 2023-04-09 MED ORDER — CHLORHEXIDINE GLUCONATE CLOTH 2 % EX PADS: 6.0000 | MEDICATED_PAD | Freq: Every day | CUTANEOUS | Status: DC

## 2023-04-09 MED ORDER — SODIUM CHLORIDE 0.9% FLUSH
10.0000 mL | Freq: Two times a day (BID) | INTRAVENOUS | Status: DC
Start: 2023-04-09 — End: 2023-04-11
  Administered 2023-04-09 – 2023-04-10 (×2): 10 mL
  Administered 2023-04-10: 20 mL
  Administered 2023-04-11: 10 mL

## 2023-04-09 MED ORDER — DOCUSATE SODIUM 100 MG PO CAPS: 100.0000 mg | ORAL_CAPSULE | Freq: Two times a day (BID) | ORAL | Status: DC | PRN

## 2023-04-09 MED ORDER — CALCIUM GLUCONATE-NACL 1-0.675 GM/50ML-% IV SOLN
1.0000 g | Freq: Once | INTRAVENOUS | Status: AC
  Administered 2023-04-09: 1000 mg via INTRAVENOUS
  Filled 2023-04-09: qty 50

## 2023-04-09 MED ORDER — ORAL CARE MOUTH RINSE: 15.0000 mL | OROMUCOSAL | Status: DC | PRN

## 2023-04-09 MED ORDER — MUPIROCIN 2 % EX OINT
1.0000 | TOPICAL_OINTMENT | Freq: Two times a day (BID) | CUTANEOUS | Status: DC
  Administered 2023-04-09 – 2023-04-11 (×4): 1 via NASAL
  Filled 2023-04-09: qty 22

## 2023-04-09 MED ORDER — SODIUM CHLORIDE 0.9% FLUSH: 10.0000 mL | INTRAVENOUS | Status: DC | PRN

## 2023-04-09 MED ORDER — LACTATED RINGERS IV BOLUS
1000.0000 mL | Freq: Once | INTRAVENOUS | Status: AC
Start: 2023-04-09 — End: 2023-04-09
  Administered 2023-04-09: 1000 mL via INTRAVENOUS

## 2023-04-09 MED ORDER — CHLORHEXIDINE GLUCONATE CLOTH 2 % EX PADS
6.0000 | MEDICATED_PAD | CUTANEOUS | Status: DC
  Administered 2023-04-10 – 2023-04-11 (×2): 6 via TOPICAL

## 2023-04-09 MED ORDER — LACTATED RINGERS IV BOLUS
1000.0000 mL | Freq: Once | INTRAVENOUS | Status: AC
  Administered 2023-04-09: 1000 mL via INTRAVENOUS

## 2023-04-09 MED ORDER — LACTATED RINGERS IV SOLN: INTRAVENOUS | Status: AC

## 2023-04-09 MED ORDER — HEPARIN SODIUM (PORCINE) 5000 UNIT/ML IJ SOLN
5000.0000 [IU] | Freq: Three times a day (TID) | INTRAMUSCULAR | Status: DC
  Administered 2023-04-09 – 2023-04-11 (×6): 5000 [IU] via SUBCUTANEOUS
  Filled 2023-04-09 (×6): qty 1

## 2023-04-09 MED ORDER — POLYETHYLENE GLYCOL 3350 17 G PO PACK: 17.0000 g | PACK | Freq: Every day | ORAL | Status: DC | PRN

## 2023-04-09 MED ORDER — DEXTROSE 50 % IV SOLN: 0.0000 mL | INTRAVENOUS | Status: DC | PRN

## 2023-04-09 MED ORDER — DEXTROSE IN LACTATED RINGERS 5 % IV SOLN: INTRAVENOUS | Status: AC

## 2023-04-09 MED ORDER — INSULIN REGULAR(HUMAN) IN NACL 100-0.9 UT/100ML-% IV SOLN
INTRAVENOUS | Status: DC
  Administered 2023-04-09: 5.5 [IU]/h via INTRAVENOUS
  Filled 2023-04-09 (×2): qty 100

## 2023-04-09 NOTE — ED Notes (Signed)
 NT and RN attempted to in and out cath pt, due to anatomy, in and out cath was unsuccessful

## 2023-04-09 NOTE — Progress Notes (Signed)
 IV team arrived at [facility] room at 12:20 to place 2nd iv site; after waiting for in/out cath to be done, a 2nd IV site was placed with ultrasound at approx 12:38 pm;

## 2023-04-09 NOTE — Progress Notes (Signed)
 Peripherally Inserted Central Catheter Placement  The IV Nurse has discussed with the patient and/or persons authorized to consent for the patient, the purpose of this procedure and the potential benefits and risks involved with this procedure.  The benefits include less needle sticks, lab draws from the catheter, and the patient may be discharged home with the catheter. Risks include, but not limited to, infection, bleeding, blood clot (thrombus formation), and puncture of an artery; nerve damage and irregular heartbeat and possibility to perform a PICC exchange if needed/ordered by physician.  Alternatives to this procedure were also discussed.  Bard Power PICC patient education guide, fact sheet on infection prevention and patient information card has been provided to patient /or left at bedside.  Telephone consent obtained from mother, Santina Murph per pt request.  PICC Placement Documentation  PICC Double Lumen 04/09/23 Right Basilic 31 cm 0 cm (Active)  Indication for Insertion or Continuance of Line Limited venous access - need for IV therapy >5 days (PICC only) 04/09/23 1845  Exposed Catheter (cm) 0 cm 04/09/23 1845  Site Assessment Clean, Dry, Intact 04/09/23 1845  Lumen #1 Status Saline locked;Blood return noted 04/09/23 1845  Lumen #2 Status Saline locked;Blood return noted 04/09/23 1845  Dressing Type Transparent;Securing device 04/09/23 1845  Dressing Status Antimicrobial disc/dressing in place;Clean, Dry, Intact 04/09/23 1845  Line Care Connections checked and tightened 04/09/23 1845  Line Adjustment (NICU/IV Team Only) No 04/09/23 1845  Dressing Intervention New dressing 04/09/23 1845  Dressing Change Due 04/16/23 04/09/23 1845       Burnard Bunting Chenice 04/09/2023, 6:50 PM

## 2023-04-09 NOTE — ED Provider Notes (Signed)
 Patient has history of diabetic ketoacidosis.  Patient presented to the emergency room for evaluation of altered mental status hyperglycemia.  Patient was found unresponsive by family at home.  Patient seen in conjunction with PA Sharyl Nimrod Physical Exam  BP (!) 102/52   Pulse (!) 113   Temp (!) 97.3 F (36.3 C) (Oral)   Resp (!) 25   SpO2 100%   Physical Exam Constitutional:      General: She is in acute distress.     Appearance: She is ill-appearing.  HENT:     Head: Normocephalic and atraumatic.     Nose: No rhinorrhea.     Mouth/Throat:     Pharynx: No oropharyngeal exudate or posterior oropharyngeal erythema.  Eyes:     General:        Right eye: No discharge.        Left eye: No discharge.  Cardiovascular:     Rate and Rhythm: Tachycardia present.  Pulmonary:     Comments: Tachypneic equal breath sounds Abdominal:     General: There is no distension.  Musculoskeletal:        General: No swelling or deformity.     Right lower leg: No edema.     Left lower leg: No edema.  Neurological:     GCS: GCS eye subscore is 4. GCS verbal subscore is 1. GCS motor subscore is 5.     Motor: No seizure activity.     Comments: Patient not following commands.     Procedures  .Critical Care  Performed by: Linwood Dibbles, MD Authorized by: Linwood Dibbles, MD   Critical care provider statement:    Critical care time (minutes):  45   Critical care was time spent personally by me on the following activities:  Development of treatment plan with patient or surrogate, discussions with consultants, evaluation of patient's response to treatment, examination of patient, ordering and review of laboratory studies, ordering and review of radiographic studies, ordering and performing treatments and interventions, pulse oximetry, re-evaluation of patient's condition and review of old charts   ED Course / MDM    Medical Decision Making Amount and/or Complexity of Data Reviewed Labs:  ordered. Radiology: ordered.  Risk Prescription drug management. Decision regarding hospitalization.   Patient's labs consistent with DKA.  IV access was established by nursing.  Patient was started on DKA protocol Labs show acidosis with pH of 6.8.  Patient has leukocytosis.  Suspect DKA related to noncompliance.  Records reveal multiple admissions to the hospital in the past related to her blood sugar.  Patient started on IV insulin IV fluids.  Treated for hyperkalemia.  With her degree of acidosis altered mental status will admit to ICU  2:05 PM mental status improving.  Patient now asking for water.  Will continue to monitor closely      Linwood Dibbles, MD 04/09/23 [id number]

## 2023-04-09 NOTE — ED Notes (Signed)
 RN expressed multiple time to EDP about not being able to get access. Bedside RN attempted 4 times, paged IV team 3 times. Ongoing care at this time.

## 2023-04-09 NOTE — Progress Notes (Signed)
 Lab having a hard time getting blood draws from pt. X2 phlebotomist's attempted. Deasi MD at bedside and made aware. Verbal order for PICC line.

## 2023-04-09 NOTE — ED Triage Notes (Signed)
 Pt from home, family found pt unresponsive. Hx of DKA. Pt non verbal at the time of triage
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
## 2023-04-09 NOTE — ED Notes (Signed)
 IV team at bedside

## 2023-04-09 NOTE — Progress Notes (Signed)
 IV Team received a 2nd consult, for a 2nd iv site to be placed at 11:45;  arrived at room at 12:20;  asked by RN to wait so that in/out cath can be done first.

## 2023-04-09 NOTE — ED Provider Notes (Cosign Needed Addendum)
 [place] EMERGENCY DEPARTMENT AT [facility] Provider Note   CSN: [id number] Arrival date & time: 04/09/23  1026     History  Chief Complaint  Patient presents with   Diabetic Ketoacidosis    Renesmae Donahey is a 23 y.o. female with PMHx DM1, DKA, migraine, ADHD, asthma, who presents to the ED altered. Patient was found unresponsive by family member who called EMS. Patient with open eyes looking around room, but not responding to questions. Patient is responsive to pain.  HPI     Home Medications Prior to Admission medications   Medication Sig Start Date End Date Taking? Authorizing Provider  brompheniramine-pseudoephedrine-DM 30-2-10 MG/5ML syrup Take 5 mLs by mouth 4 (four) times daily as needed. 02/17/23   [name], PA-C  Continuous Glucose Sensor (DEXCOM G7 SENSOR) MISC Inject 1 Application into the skin as directed. 10/30/22   Ivery Quale, MD  escitalopram (LEXAPRO) 20 MG tablet Take 20 mg by mouth daily as needed. 10/04/22 01/02/23  [provider]  ferrous sulfate 324 MG TBEC Take 1 tablet (324 mg total) by mouth every other day. 11/02/22   Fortunato Curling, DO  fluticasone (FLONASE) 50 MCG/ACT nasal spray Place 2 sprays into both nostrils daily. 02/17/23   [name], PA-C  Glucagon (BAQSIMI TWO PACK) 3 MG/DOSE POWD Place 3 mg into the nose as needed (low blood sugar).    [provider]  ibuprofen (ADVIL) 800 MG tablet Take 800 mg by mouth every 6 (six) hours as needed. 12/11/22   [provider]  Insulin Aspart FlexPen (NOVOLOG) 100 UNIT/ML Inject 0-50 Units into the skin See admin instructions. INJECT 1 UNIT FOR EVERY 20G OF CARBS. PLUS 1 UNIT FOR EVERY 50 ABOVE 150 MG/DL PREMEALS. MAX DOSAGE 50U/DAY Patient taking differently: Inject 0-50 Units into the skin See admin instructions. INJECT 1 UNIT FOR EVERY 25G OF CARBS. PLUS 1 UNIT FOR EVERY 50 ABOVE 150 MG/DL PREMEALS. MAX DOSAGE 50U/DAY 12/26/22   Jerald Kief, MD   insulin degludec ([name]) 100 UNIT/ML FlexTouch Pen Inject 30 Units into the skin daily. Per your Endocrinologist, increase by 2 units every 3 days until fasting BG is 200mg /dL or less 56/21/30   Jerald Kief, MD  Insulin Disposable Pump (OMNIPOD 5 DEXG7G6 INTRO GEN 5) KIT Use as prescribed 01/27/23   Darral Dash, DO  medroxyPROGESTERone (DEPO-PROVERA) 150 MG/ML injection Inject 150 mg into the muscle every 3 (three) months. Patient not taking: Reported on 12/16/2022 12/11/22   [provider]  norgestimate-ethinyl estradiol (SPRINTEC 28) 0.25-35 MG-MCG tablet Take 1 tablet by mouth daily. Patient not taking: Reported on 12/16/2022 12/12/22   Dameron, Nolberto Hanlon, DO  ondansetron (ZOFRAN-ODT) 4 MG disintegrating tablet Take 1 tablet (4 mg total) by mouth every 8 (eight) hours as needed. 02/17/23   [name], PA-C  oxyCODONE-acetaminophen (PERCOCET/ROXICET) 5-325 MG tablet Take 1 tablet by mouth every 6 (six) hours as needed for severe pain (pain score 7-10). 01/11/23   Gwyneth Sprout, MD  SUMAtriptan (IMITREX) 25 MG tablet Take 0.5 tablets (12.5 mg total) by mouth every 2 (two) hours as needed for migraine. May repeat in 2 hours if headache persists or recurs. 04/12/21   Dana Allan, MD  traMADol (ULTRAM) 50 MG tablet Take 1 tablet (50 mg total) by mouth every 6 (six) hours as needed for severe pain (pain score 7-10) or moderate pain (pain score 4-6). 01/08/23   Karie Soda, MD  triamcinolone cream (KENALOG) 0.1 % Apply 1 Application  topically 2 (two) times daily. 01/19/23   [name], PA-C  Vitamin D, Ergocalciferol, (DRISDOL) 1.25 MG (50000 UNIT) CAPS capsule Take 50,000 Units by mouth every 7 (seven) days. 05/24/22   [provider]      Allergies    Latex, Tape, and Shellfish allergy    Review of Systems   Review of Systems  Neurological:        Altered    Physical Exam Updated Vital Signs BP (!) 122/99   Pulse (!) 114   Temp (!) 97.3 F (36.3  C) (Oral)   Resp (!) 24   SpO2 100%  Physical Exam Vitals and nursing note reviewed.  Constitutional:      General: She is not in acute distress.    Appearance: She is ill-appearing.  HENT:     Head: Normocephalic and atraumatic.     Mouth/Throat:     Mouth: Mucous membranes are moist.  Eyes:     General: No scleral icterus.       Right eye: No discharge.        Left eye: No discharge.     Conjunctiva/sclera: Conjunctivae normal.  Cardiovascular:     Rate and Rhythm: Regular rhythm. Tachycardia present.     Pulses: Normal pulses.     Heart sounds: Normal heart sounds. No murmur heard. Pulmonary:     Effort: Pulmonary effort is normal. No respiratory distress.     Breath sounds: Normal breath sounds. No wheezing, rhonchi or rales.  Musculoskeletal:     Right lower leg: No edema.     Left lower leg: No edema.  Skin:    General: Skin is warm and dry.     Findings: No rash.  Neurological:     Mental Status: She is alert.     Comments: GCS 9. Not responding but eyes are open.  Psychiatric:        Mood and Affect: Mood normal.     ED Results / Procedures / Treatments   Labs (all labs ordered are listed, but only abnormal results are displayed) Labs Reviewed  CBC WITH DIFFERENTIAL/PLATELET - Abnormal; Notable for the following components:      Result Value   WBC 24.1 (*)    Hemoglobin 9.8 (*)    MCH 25.1 (*)    MCHC 25.6 (*)    RDW 18.2 (*)    Platelets 592 (*)    Neutro Abs 14.7 (*)    Lymphs Abs 4.3 (*)    Monocytes Absolute 2.4 (*)    Basophils Absolute 1.0 (*)    nRBC 1 (*)    Abs Immature Granulocytes 1.40 (*)    All other components within normal limits  BASIC METABOLIC PANEL WITH GFR - Abnormal; Notable for the following components:   Potassium >7.5 (*)    CO2 <7 (*)    Glucose, Bld 1,160 (*)    BUN 48 (*)    Creatinine, Ser 2.66 (*)    GFR, Estimated 25 (*)    All other components within normal limits  CBG MONITORING, ED - Abnormal; Notable for the  following components:   Glucose-Capillary >600 (*)    All other components within normal limits  I-STAT VENOUS BLOOD GAS, ED - Abnormal; Notable for the following components:   pH, Ven 6.822 (*)    pCO2, Ven 18.3 (*)    pO2, Ven 51 (*)    Bicarbonate 3.0 (*)    TCO2 <5 (*)    Acid-base deficit  30.0 (*)    Potassium 8.4 (*)    HCT 28.0 (*)    Hemoglobin 9.5 (*)    All other components within normal limits  CBG MONITORING, ED - Abnormal; Notable for the following components:   Glucose-Capillary >600 (*)    All other components within normal limits  CBG MONITORING, ED - Abnormal; Notable for the following components:   Glucose-Capillary >600 (*)    All other components within normal limits  CBG MONITORING, ED - Abnormal; Notable for the following components:   Glucose-Capillary >600 (*)    All other components within normal limits  HCG, SERUM, QUALITATIVE  URINALYSIS, ROUTINE W REFLEX MICROSCOPIC  BETA-HYDROXYBUTYRIC ACID  RAPID URINE DRUG SCREEN, HOSP PERFORMED  ETHANOL  BASIC METABOLIC PANEL WITH GFR  BASIC METABOLIC PANEL WITH GFR  BETA-HYDROXYBUTYRIC ACID  BETA-HYDROXYBUTYRIC ACID  MAGNESIUM  MAGNESIUM  POTASSIUM  I-STAT CHEM 8, ED  TROPONIN I (HIGH SENSITIVITY)    EKG EKG Interpretation Date/Time:  Sunday April 09 2023 10:34:17 EDT Ventricular Rate:  122 PR Interval:  120 QRS Duration:  98 QT Interval:  319 QTC Calculation: 455 R Axis:   69  Text Interpretation: Sinus tachycardia LAE, consider biatrial enlargement ST elevation, consider anterior injury increased t wave amplitude compared to prior Confirmed by Linwood Dibbles [phone]) on 04/09/2023 11:46:34 AM  Radiology No results found.  Procedures .Critical Care  Performed by: [name], PA-C Authorized by: [name], PA-C   Critical care provider statement:    Critical care time (minutes):  30   Critical care was necessary to treat or prevent imminent or life-threatening deterioration of  the following conditions:  Endocrine crisis   Critical care was time spent personally by me on the following activities:  Development of treatment plan with patient or surrogate, discussions with consultants, evaluation of patient's response to treatment, examination of patient, ordering and review of laboratory studies, ordering and review of radiographic studies, ordering and performing treatments and interventions, pulse oximetry, re-evaluation of patient's condition and review of old charts   Care discussed with: admitting provider   Comments:     DKA and AMS requiring ICU admission     Medications Ordered in ED Medications  insulin regular, human (MYXREDLIN) 100 units/ 100 mL infusion (5.5 Units/hr Intravenous New Bag/Given 04/09/23 1230)  dextrose 5 % in lactated ringers infusion (has no administration in time range)  dextrose 50 % solution 0-50 mL (has no administration in time range)  lactated ringers infusion ( Intravenous New Bag/Given 04/09/23 1409)  docusate sodium (COLACE) capsule 100 mg (has no administration in time range)  polyethylene glycol (MIRALAX / GLYCOLAX) packet 17 g (has no administration in time range)  heparin injection 5,000 Units (5,000 Units Subcutaneous Given 04/09/23 1418)  lactated ringers bolus 1,000 mL (0 mLs Intravenous Stopped 04/09/23 1359)  calcium gluconate 1 g/ 50 mL sodium chloride IVPB (0 mg Intravenous Stopped 04/09/23 1230)  lactated ringers bolus 1,000 mL (0 mLs Intravenous Stopped 04/09/23 1418)    And  lactated ringers bolus 1,000 mL (0 mLs Intravenous Stopped 04/09/23 1406)  lactated ringers bolus 1,000 mL (1,000 mLs Intravenous New Bag/Given 04/09/23 1403)    ED Course/ Medical Decision Making/ A&P                                 Medical Decision Making Amount and/or Complexity of Data Reviewed Labs: ordered. Radiology: ordered.  Risk Prescription drug  management. Decision regarding hospitalization.   This patient presents to the ED for concern  of AMS, this involves an extensive number of treatment options, and is a complaint that carries with it a high risk of complications and morbidity.  The differential diagnosis includes CVA, ICH, intracranial mass, critical dehydration, heptatic dysfunction, uremia, hypercarbia, intoxication/withdrawal, endocrine abnormality, sepsis/infection.   Co morbidities that complicate the patient evaluation  DM1, DKA, migraine, ADHD, asthma,    Problem List / ED Course / Critical interventions / Medication management  Patient presented for AMS. Likely DKA as patient has hx of this and CBG >600.  I Ordered, and personally interpreted labs.  BMP with potassium >7.5, hyperglycemia at 1160, BUN/Cr elevated at 48/2.66.  CBC with leukocytosis of 24.1.  There is also anemia with hemoglobin at 9.8.  VBG showing acidotic with pH at 6.822. rest of blood workup pending.  I personally viewed and interpreted the EKG/cardiac monitored which showed an underlying rhythm of: Sinus tachycardia.  Peaked T waves. I ordered imaging studies including CT head.This imaging is pending. Started patient on IV fluids, insulin, calcium gluconate. Used DKA order set. I requested consultation with the Critical Care,  and discussed lab and imaging findings as well as pertinent plan - they agree to admit patient. Patient eventually waking up and asking for ice chips. Dr. Celine Mans admitting provider. Staffed with Dr. Lynelle Doctor who agrees with plan. I have reviewed the patients home medicines and have made adjustments as needed   Social Determinants of Health:  none           Final Clinical Impression(s) / ED Diagnoses Final diagnoses:  Diabetic ketoacidosis without coma associated with type 1 diabetes mellitus [facility])    Rx / DC Orders ED Discharge Orders     None         [name], [place] 04/09/23 1421    [name], [place] 04/09/23 1421    Linwood Dibbles, MD 04/10/23 [phone]

## 2023-04-10 LAB — BASIC METABOLIC PANEL WITH GFR
Anion gap: 13 (ref 5–15)
Anion gap: 10 (ref 5–15)
Anion gap: 10 (ref 5–15)
Anion gap: 12 (ref 5–15)
BUN: 26 mg/dL — ABNORMAL HIGH (ref 6–20)
BUN: 23 mg/dL — ABNORMAL HIGH (ref 6–20)
BUN: 20 mg/dL (ref 6–20)
BUN: 17 mg/dL (ref 6–20)
CO2: 18 mmol/L — ABNORMAL LOW (ref 22–32)
CO2: 22 mmol/L (ref 22–32)
CO2: 21 mmol/L — ABNORMAL LOW (ref 22–32)
CO2: 20 mmol/L — ABNORMAL LOW (ref 22–32)
Calcium: 8.9 mg/dL (ref 8.9–10.3)
Calcium: 9.3 mg/dL (ref 8.9–10.3)
Calcium: 9 mg/dL (ref 8.9–10.3)
Calcium: 8.8 mg/dL — ABNORMAL LOW (ref 8.9–10.3)
Chloride: 121 mmol/L — ABNORMAL HIGH (ref 98–111)
Chloride: 121 mmol/L — ABNORMAL HIGH (ref 98–111)
Chloride: 117 mmol/L — ABNORMAL HIGH (ref 98–111)
Chloride: 112 mmol/L — ABNORMAL HIGH (ref 98–111)
Creatinine, Ser: 1.23 mg/dL — ABNORMAL HIGH (ref 0.44–1.00)
Creatinine, Ser: 1.02 mg/dL — ABNORMAL HIGH (ref 0.44–1.00)
Creatinine, Ser: 1.01 mg/dL — ABNORMAL HIGH (ref 0.44–1.00)
Creatinine, Ser: 0.93 mg/dL (ref 0.44–1.00)
GFR, Estimated: >60 mL/min (ref >60)
GFR, Estimated: >60 mL/min (ref >60)
GFR, Estimated: >60 mL/min (ref >60)
GFR, Estimated: >60 mL/min (ref >60)
Glucose, Bld: 215 mg/dL — ABNORMAL HIGH (ref 70–99)
Glucose, Bld: 162 mg/dL — ABNORMAL HIGH (ref 70–99)
Glucose, Bld: 191 mg/dL — ABNORMAL HIGH (ref 70–99)
Glucose, Bld: 220 mg/dL — ABNORMAL HIGH (ref 70–99)
Potassium: 3.8 mmol/L (ref 3.5–5.1)
Potassium: 3.3 mmol/L — ABNORMAL LOW (ref 3.5–5.1)
Potassium: 3.4 mmol/L — ABNORMAL LOW (ref 3.5–5.1)
Potassium: 4.1 mmol/L (ref 3.5–5.1)
Sodium: 152 mmol/L — ABNORMAL HIGH (ref 135–145)
Sodium: 153 mmol/L — ABNORMAL HIGH (ref 135–145)
Sodium: 148 mmol/L — ABNORMAL HIGH (ref 135–145)
Sodium: 144 mmol/L (ref 135–145)

## 2023-04-10 LAB — MAGNESIUM
Magnesium: 2 mg/dL (ref 1.7–2.4)
Magnesium: 1.9 mg/dL (ref 1.7–2.4)
Magnesium: 2.9 mg/dL — ABNORMAL HIGH (ref 1.7–2.4)
Magnesium: 3.2 mg/dL — ABNORMAL HIGH (ref 1.7–2.4)

## 2023-04-10 LAB — BETA-HYDROXYBUTYRIC ACID
Beta-Hydroxybutyric Acid: 6.47 mmol/L — ABNORMAL HIGH (ref 0.05–0.27)
Beta-Hydroxybutyric Acid: 3.12 mmol/L — ABNORMAL HIGH (ref 0.05–0.27)
Beta-Hydroxybutyric Acid: 1.53 mmol/L — ABNORMAL HIGH (ref 0.05–0.27)
Beta-Hydroxybutyric Acid: 1.47 mmol/L — ABNORMAL HIGH (ref 0.05–0.27)
Beta-Hydroxybutyric Acid: 1.27 mmol/L — ABNORMAL HIGH (ref 0.05–0.27)

## 2023-04-10 LAB — CBC
HCT: 24.5 % — ABNORMAL LOW (ref 36.0–46.0)
Hemoglobin: 7.6 g/dL — ABNORMAL LOW (ref 12.0–15.0)
MCH: 25.1 pg — ABNORMAL LOW (ref 26.0–34.0)
MCHC: 31 g/dL (ref 30.0–36.0)
MCV: 80.9 fL (ref 80.0–100.0)
Platelets: 429 10*3/uL — ABNORMAL HIGH (ref 150–400)
RBC: 3.03 MIL/uL — ABNORMAL LOW (ref 3.87–5.11)
RDW: 17.4 % — ABNORMAL HIGH (ref 11.5–15.5)
WBC: 17.7 10*3/uL — ABNORMAL HIGH (ref 4.0–10.5)
nRBC: 0.1 % (ref 0.0–0.2)

## 2023-04-10 LAB — PHOSPHORUS: Phosphorus: 2.9 mg/dL (ref 2.5–4.6)

## 2023-04-10 LAB — GLUCOSE, CAPILLARY
Glucose-Capillary: 125 mg/dL — ABNORMAL HIGH (ref 70–99)
Glucose-Capillary: 149 mg/dL — ABNORMAL HIGH (ref 70–99)
Glucose-Capillary: 189 mg/dL — ABNORMAL HIGH (ref 70–99)
Glucose-Capillary: 190 mg/dL — ABNORMAL HIGH (ref 70–99)
Glucose-Capillary: 191 mg/dL — ABNORMAL HIGH (ref 70–99)
Glucose-Capillary: 192 mg/dL — ABNORMAL HIGH (ref 70–99)
Glucose-Capillary: 202 mg/dL — ABNORMAL HIGH (ref 70–99)
Glucose-Capillary: 206 mg/dL — ABNORMAL HIGH (ref 70–99)
Glucose-Capillary: 210 mg/dL — ABNORMAL HIGH (ref 70–99)
Glucose-Capillary: 215 mg/dL — ABNORMAL HIGH (ref 70–99)
Glucose-Capillary: 219 mg/dL — ABNORMAL HIGH (ref 70–99)
Glucose-Capillary: 224 mg/dL — ABNORMAL HIGH (ref 70–99)

## 2023-04-10 LAB — HEMOGLOBIN A1C
Hgb A1c MFr Bld: 11.4 % — ABNORMAL HIGH (ref 4.8–5.6)
Mean Plasma Glucose: 280 mg/dL

## 2023-04-10 MED ORDER — ALUM & MAG HYDROXIDE-SIMETH 200-200-20 MG/5ML PO SUSP
30.0000 mL | Freq: Once | ORAL | Status: AC
  Administered 2023-04-11: 30 mL via ORAL
  Filled 2023-04-10: qty 30

## 2023-04-10 MED ORDER — DICYCLOMINE HCL 10 MG/5ML PO SOLN
10.0000 mg | Freq: Once | ORAL | Status: AC
  Administered 2023-04-11: 10 mg via ORAL
  Filled 2023-04-10: qty 5

## 2023-04-10 MED ORDER — MAGNESIUM SULFATE 2 GM/50ML IV SOLN
2.0000 g | Freq: Once | INTRAVENOUS | Status: AC
  Administered 2023-04-10: 2 g via INTRAVENOUS
  Filled 2023-04-10: qty 50

## 2023-04-10 MED ORDER — INSULIN ASPART 100 UNIT/ML IJ SOLN
0.0000 [IU] | Freq: Three times a day (TID) | INTRAMUSCULAR | Status: DC
  Administered 2023-04-10 (×2): 2 [IU] via SUBCUTANEOUS
  Administered 2023-04-11: 5 [IU] via SUBCUTANEOUS

## 2023-04-10 MED ORDER — HYOSCYAMINE SULFATE 0.125 MG SL SUBL
0.2500 mg | SUBLINGUAL_TABLET | Freq: Once | SUBLINGUAL | Status: AC
  Administered 2023-04-11: 0.25 mg via SUBLINGUAL
  Filled 2023-04-10: qty 2

## 2023-04-10 MED ORDER — POTASSIUM CHLORIDE CRYS ER 20 MEQ PO TBCR
40.0000 meq | EXTENDED_RELEASE_TABLET | ORAL | Status: AC
  Administered 2023-04-10 (×2): 40 meq via ORAL
  Filled 2023-04-10 (×2): qty 2

## 2023-04-10 MED ORDER — INSULIN GLARGINE-YFGN 100 UNIT/ML ~~LOC~~ SOLN
25.0000 [IU] | Freq: Every day | SUBCUTANEOUS | Status: DC
  Administered 2023-04-10 – 2023-04-11 (×2): 25 [IU] via SUBCUTANEOUS
  Filled 2023-04-10 (×2): qty 0.25

## 2023-04-10 MED ORDER — PANTOPRAZOLE SODIUM 40 MG PO TBEC
40.0000 mg | DELAYED_RELEASE_TABLET | Freq: Every day | ORAL | Status: DC
  Administered 2023-04-11 (×2): 40 mg via ORAL
  Filled 2023-04-10 (×2): qty 1

## 2023-04-10 MED ORDER — INSULIN ASPART 100 UNIT/ML IJ SOLN
7.0000 [IU] | Freq: Three times a day (TID) | INTRAMUSCULAR | Status: DC
  Administered 2023-04-11: 7 [IU] via SUBCUTANEOUS

## 2023-04-10 NOTE — Plan of Care (Signed)
  Problem: Education: Goal: Knowledge of General Education information will improve Description: Including pain rating scale, medication(s)/side effects and non-pharmacologic comfort measures Outcome: Progressing   Problem: Clinical Measurements: Goal: Will remain free from infection Outcome: Progressing   Problem: Pain Managment: Goal: General experience of comfort will improve and/or be controlled Outcome: Progressing   Problem: Education: Goal: Ability to describe self-care measures that may prevent or decrease complications (Diabetes Survival Skills Education) will improve Outcome: Progressing   Problem: Nutrition: Goal: Adequate nutrition will be maintained Outcome: Not Progressing

## 2023-04-10 NOTE — Progress Notes (Addendum)
 eLink Physician-Brief Progress Note Patient Name: Dawn [name] DOB: [birth year] MRN: [id number]   Date of Service  04/10/2023  HPI/Events of Note  23 year old woman with a history of poorly controlled type 1 diabetes mellitus. She has had over 20 admissions in the last 12 months or ED visits related to her diabetes. Presents with DKA   Complaining of 9 out of 10 chest pain.  ECG within normal limits.  Has a known history of esophagitis, currently not taking any PPI  In no obvious distress on camera examination.  eICU Interventions  Given clinical presentation, ACS seems unlikely.  One-time GI cocktail and resume home PPI     Intervention Category Minor Interventions: Routine modifications to care plan (e.g. PRN medications for pain, fever)  Burech Mcfarland 04/10/2023, 10:28 PM

## 2023-04-10 NOTE — TOC Initial Note (Signed)
 Transition of Care [facility]) - Initial/Assessment Note    Patient Details  Name: Dawn [name] MRN: [id number] Date of Birth: 09-08-2000  Transition of [facility]) CM/SW Contact:    Marliss Coots, LCSW Phone Number: 04/10/2023, 11:59 AM  Clinical Narrative:                  11:59 AM CSW and RNCM introduced self and role to patient at bedside. CSW followed up on SDOH needs (housing). Patient confirmed needs and expressed interest in list of food pantries, as well. CSW provided SDOH resources. Patient confirmed she is in [place] visiting family but resides in an apartment in [place] with sister ([address], [place], [place] [address]). Patient confirmed she drives to and from medical/non-medical appointments and that mother would be able to provide transportation at discharge. Patient confirmed that she receives food stamps. Patient informed RNCM that medication is at home in [place] and is attempting to obtain Endocrinologist through [facility].   Expected Discharge Plan: Home/Self Care Barriers to Discharge: Continued Medical Work up   Patient Goals and CMS Choice Patient states their goals for this hospitalization and ongoing recovery are:: to return home          Expected Discharge Plan and Services       Living arrangements for the past 2 months: Apartment                                      Prior Living Arrangements/Services Living arrangements for the past 2 months: Apartment Lives with:: Siblings Patient language and need for interpreter reviewed:: Yes Do you feel safe going back to the place where you live?: Yes            Criminal Activity/Legal Involvement Pertinent to Current Situation/Hospitalization: No - Comment as needed  Activities of Daily Living      Permission Sought/Granted Permission sought to share information with : Family Supports Permission granted to share information with : No (Contact information on  chart)  Share Information with NAME: [name]     Permission granted to share info w Relationship: Aunt  Permission granted to share info w Contact Information: [phone]  Emotional Assessment Appearance:: Appears stated age Attitude/Demeanor/Rapport: Engaged Affect (typically observed): Accepting, Pleasant, Adaptable, Stable, Calm, Appropriate Orientation: : Oriented to Self, Oriented to Place, Oriented to  Time, Oriented to Situation Alcohol / Substance Use: Not Applicable Psych Involvement: No (comment)  Admission diagnosis:  DKA (diabetic ketoacidosis) (HCC) [E11.10] Diabetic ketoacidosis without coma associated with type 1 diabetes mellitus (HCC) [E10.10] Patient Active Problem List   Diagnosis Date Noted   DKA (diabetic ketoacidosis) (HCC) 12/25/2022   Pelvic pain 12/16/2022   Screening examination for STI 12/15/2022   Contraception management 12/15/2022   COVID-19 10/02/2022   Sinus tachycardia 08/12/2022   Chronic musculoskeletal pain 08/03/2022   Sepsis (HCC) 03/28/2022   Housing insecurity 08/09/2021   Menstrual periods irregular 06/17/2021   Diabetic ketoacidosis without coma associated with type 1 diabetes mellitus (HCC) 04/10/2021   Tension headache, chronic 12/24/2019   Hx of migraines 07/11/2019   Protein-calorie malnutrition, severe 06/25/2019   Asthma 09/25/2018   Hyperglycemia    Noncompliance with diabetes treatment 07/04/2017   MDD (major depressive disorder), recurrent episode, moderate (HCC) 03/16/2016   Depression 03/24/2015   Maladaptive health behaviors affecting medical condition 03/24/2015   Poor social situation 07/20/2013   Poorly  controlled type 1 diabetes mellitus (HCC) 07/07/2013   ADHD (attention deficit hyperactivity disorder) 12/09/2010   PCP:  Oneita Hurt, No Pharmacy:   [facility] [phone] - [place], [place] - [address] AT [facility] OF [address] [place] [address] Phone:  [phone] Fax: [phone]  [facility] [address] [place] [address] Phone: [phone] Fax: [phone]  [facility] [phone] - [place], [place] - [address] AT [facility] OF [address] [place] [address] Phone: [phone] Fax: [phone]  [id number] - [place], [place] - [address] AT [facility] OF [address] [place] [address] Phone: [phone] Fax: [phone]     Social Drivers of Health (SDOH) Social History: SDOH Screenings   Food Insecurity: No Food Insecurity (04/10/2023)  Recent Concern: Food Insecurity - Food Insecurity Present (04/07/2023)   Received [facility]  Housing: High Risk (04/10/2023)  Transportation Needs: No Transportation Needs (04/10/2023)  Recent Concern: Transportation Needs - Unmet Transportation Needs (04/07/2023)   Received from [facility]  Utilities: Not At Risk (04/10/2023)  Recent Concern: Utilities - At Risk (03/02/2023)   Received from [facility]  Alcohol Screen: Low Risk  (08/12/2022)  Depression (PHQ2-9): Medium Risk (10/31/2022)  Financial Resource Strain: High Risk (04/07/2023)   Received from [facility]  Physical Activity: Unknown (03/02/2023)   Received from [facility]  Social Connections: Socially Isolated (03/02/2023)   Received from [facility]  Stress: No Stress Concern Present (03/24/2023)   Received from [facility]  Tobacco Use: Medium Risk (04/09/2023)  Health Literacy: Inadequate Health Literacy (03/02/2023)   [facility] from [facility]   SDOH Interventions:     Readmission Risk Interventions    01/03/2023   11:17 AM 03/07/2022   10:22 AM  Readmission Risk Prevention Plan  Transportation Screening Complete Complete  PCP or Specialist Appt within 5-7  Days Complete   Home Care Screening Complete   Medication Review (RN CM) Complete   Medication Review (RN Care Manager)  Complete  PCP or Specialist appointment within 3-5 days of discharge  Complete  HRI or Home Care Consult  Complete  SW Recovery Care/Counseling Consult  Complete  Palliative Care Screening  Not Applicable  Skilled Nursing Facility  Not Applicable

## 2023-04-10 NOTE — Progress Notes (Signed)
 eLink Physician-Brief Progress Note Patient Name: Dawn [name] DOB: 2000-07-31 MRN: [id number]   Date of Service  04/10/2023  HPI/Events of Note  23 year old woman with a history of poorly controlled type 1 diabetes mellitus. She has had over 20 admissions in the last 12 months or ED visits related to her diabetes. Presents with DKA.   BMP with hypernatremia, persistent acisosis, aki improving  eICU Interventions  Continue observation     Intervention Category Minor Interventions: Routine modifications to care plan (e.g. PRN medications for pain, fever)  Dawn [name] 04/10/2023, 12:31 AM

## 2023-04-10 NOTE — Inpatient Diabetes Management (Addendum)
 Inpatient Diabetes Program Recommendations  AACE/ADA: New Consensus Statement on Inpatient Glycemic Control (2015)  Target Ranges:  Prepandial:   less than 140 mg/dL      Peak postprandial:   less than 180 mg/dL (1-2 hours)      Critically ill patients:  140 - 180 mg/dL   Lab Results  Component Value Date   GLUCAP 149 (H) 04/10/2023   HGBA1C 9.6 (H) 12/16/2022   Diabetes history: DM1 Outpatient Diabetes medications: Insulin pump Uses [name] 30 daily as back up basal insulin in case of pump failure  Current orders for Inpatient glycemic control: Semglee 25 daily, Novolog 0-9 units TID with meals + 7 units TID  Inpatient Diabetes Program Recommendations:   Noted from notes pt. Went to her grandmothers birthday party and did not take her insulin nor have her insulin pump on (ran out of insulin and Dexcom G7 sensors). Give Semglee 2 hrs. Prior to insulin drip D/C. Give Novolog correction @ time of insulin drip D/C'd.  Patient well known to DM coordinators due to multiple ED visits and hospital admissions. DM coordinator last spoke with patient on admission 01/06/23. Please consider: -Decrease Novolog meal coverage to 3 units tid if eats 50%  Spoke with patient @ bedside and reviewed need to take [name] 30 units daily if she is off of her insulin pump. Gave patient 2 Dexcom G 7 sensors  for use as needed so she will be able to check glucose levels more readily and work with her insulin pump.  Will follow during hospitalization.  Thank you, Billy Fischer. Sheri Prows, RN, MSN, CDCES  Diabetes Coordinator Inpatient Glycemic Control Team Team Pager [phone] (8am-5pm) 04/10/2023 9:08 AM\

## 2023-04-10 NOTE — Progress Notes (Signed)
   NAMEGloriana [name], MRN:  [id number], DOB:  July 21, 2000, LOS: 1 ADMISSION DATE:  04/09/2023, CONSULTATION DATE:  4/6 REFERRING MD: Charlotte Sanes / Dr. Lynelle Doctor - EM  , CHIEF COMPLAINT:  AMS    History of Present Illness:  23 yo F PMH DM1, DKA, medical non-adherence, esophagitis, ADHD, who presented to ED 4/6 for evaluation after being found unresponsive by family.  Labs are c/w DKA + severe acidosis, hyperkalemia, associated t wave changes on EKG. Started on insulin, received calcium. Difficult IV access, is getting started on IVF. GCS 9 for EM team  PCCM consulted in this setting   I ordered for an additional 3L LR after call, prior to seeing pt    Admitted to [facility] twice in march for DKA, and once in feb for hypoglycemia. Hospital records reviewed for those admissions. Looks like there is a significant barrier to medication adherence as patient does not like administering SQ injections, as well as documented misuse of her insulin pump/confusion regarding utilization.   Close to 2 dozen hospital touches in 2024 for DM1 related issues.   Pertinent  Medical History  DM1 Medication non adherence   Significant Hospital Events: Including procedures, antibiotic start and stop dates in addition to other pertinent events   ED for unresponsiveness. ICU for DKA   Interim History / Subjective:  Feels better. AG has essentially closed and patient has been transitioned off insulin.   Objective   Blood pressure 109/82, pulse (!) 104, temperature 98.5 F (36.9 C), temperature source Oral, resp. rate (!) 8, height 5\' 2"  (1.575 m), weight 49.2 kg, SpO2 99%.        Intake/Output Summary (Last 24 hours) at 04/10/2023 1345 Last data filed at 04/10/2023 1300 Gross per 24 hour  Intake 7180.17 ml  Output 1925 ml  Net 5255.17 ml   Filed Weights   04/09/23 1500 04/10/23 0140  Weight: 49.2 kg 49.2 kg    Examination: General: thin woman.  Neuro: Appears intact. Answers appropriately HENT: NCAT anicteric  sclera  Lungs: chest clear. No tachypnea Cardiovascular: HS normal Abdomen: softe Extremities: no obvious acute deformity  GU: defer   Ancillary Tests Personally Reviewed:   Na improving to 148 Creatinine 1.01, AG 10  BHA continues to drop.  Assessment & Plan:   Acute metabolic encephalopathy due to DKA - now resolved. DM1, poorly controlled  Medical non-adherence Suspected low health literacy  Plan:  - transition off IV insulin and transfer to floor.  - DM coordinator has seen and will facilitate resumption of home insulin.  - Followed by [facility] Endocrinology - plan is to start on CGM and IP.   Best Practice (right click and "Reselect all SmartList Selections" daily)   Diet/type: regular. DVT prophylaxis prophylactic heparin  Pressure ulcer(s): N/A GI prophylaxis: N/A Lines: N/A Foley:  N/A Code Status:  full code Last date of multidisciplinary goals of care discussion [--]  Lynnell Catalan, MD [facility] ICU Physician [facility]  Pager: [phone] Or Epic Secure Chat After hours: [phone].  04/10/2023, 2:27 PM

## 2023-04-10 NOTE — Progress Notes (Signed)
  Pharmacy Electrolyte Replacement  Recent Labs:  Recent Labs    04/10/23 0756  K 3.3*  MG 1.9  PHOS 2.9  CREATININE 1.02*    Low Critical Values (K </= 2.5, Phos </= 1, Mg </= 1) Present: None  MD Contacted: n/a   Plan: KCL PO 40 mEq q4h x2, Mag sulfate 2 g IV x1   Cedric Fishman, PharmD, BCPS, BCCCP Clinical Pharmacist

## 2023-04-11 ENCOUNTER — Encounter (HOSPITAL_COMMUNITY): Payer: Self-pay

## 2023-04-11 ENCOUNTER — Other Ambulatory Visit (HOSPITAL_COMMUNITY): Payer: Self-pay

## 2023-04-11 LAB — GLUCOSE, CAPILLARY
Glucose-Capillary: 101 mg/dL — ABNORMAL HIGH (ref 70–99)
Glucose-Capillary: 161 mg/dL — ABNORMAL HIGH (ref 70–99)
Glucose-Capillary: 253 mg/dL — ABNORMAL HIGH (ref 70–99)
Glucose-Capillary: 44 mg/dL — CL (ref 70–99)
Glucose-Capillary: 54 mg/dL — ABNORMAL LOW (ref 70–99)

## 2023-04-11 MED ORDER — INSULIN ASPART 100 UNIT/ML FLEXPEN
7.0000 [IU] | PEN_INJECTOR | Freq: Three times a day (TID) | SUBCUTANEOUS | 0 refills | Status: DC
  Filled 2023-04-11: qty 15, 72d supply, fill #0

## 2023-04-11 MED ORDER — INSULIN PEN NEEDLE 32G X 4 MM MISC
0 refills | Status: DC
  Filled 2023-04-11: qty 100, 30d supply, fill #0

## 2023-04-11 MED ORDER — INSULIN ASPART 100 UNIT/ML FLEXPEN
4.0000 [IU] | PEN_INJECTOR | Freq: Three times a day (TID) | SUBCUTANEOUS | 0 refills | Status: DC
  Filled 2023-04-11: qty 15, 125d supply, fill #0

## 2023-04-11 NOTE — Progress Notes (Signed)
 PICC Removal Note: PICC line removed from RUE. PICC catheter tip visualized and intact. Pressure held until hemostasis achieved. Pressure dressing applied. No redness, ecchymosis, edema, swelling, or drainage noted at site. Instructions provided on post PICC discharge care, including followup notification instructions.  Bedrest until 1215.

## 2023-04-11 NOTE — Discharge Instructions (Addendum)
 Dear Ms Pala It was a pleasure taking care of you at [facility]. You were admitted for hyperglycemia  and treated for DKA. We are discharging you home now that you are doing better. Please follow the following instructions.  PLEASE take your medicines as discussed  Take care,  Dr. Kathleen Lime, MD

## 2023-04-11 NOTE — Discharge Summary (Signed)
 Resident Discharge Summary  Patient ID: Dawn [name] MRN: [id number] DOB/AGE: [birth year] 22 y.o.  Admit date: 04/09/2023 Discharge date: 04/11/2023  Problem List Active Problems:   DKA (diabetic ketoacidosis) (HCC)  HPI:  Dawn [name]  is a 23 year old female with a history of poorly controlled type 1 diabetes with multiple hospitalizations in the past year for DKA. She presented to the hospital  for chest pain and was  found  with a CBG greater than 600 and a VBG of pH less than 6.9 and admitted for insulin drip in the setting of DKA.   Hospital Course: On presentation to the ED ,this patient had an undetectably high CBG greater than 600 and a VBG with a pH of less than 6.9.  She was started on IV fluids and insulin drip and admitted to the ICU.  She also had a severe hyperkalemia greater than 8 and hide peaked T waves on EKG.  She was also found to have acute kidney injury in the setting of dehydration with pseudohyponatremia.  On day 2 of tthis hospitalization, her acute kidney injury had improved with fluids and hyperglycemia got better with insulin drip.  She began to be more awake and euvolemic on exam.  Her anion gap had closed conservatively for 3 times and she was transition back to subQ insulin with a drip bridge until she was fully transitioned off the drip and diet resumed. Inpatient diabetes coordinator was consulted to further educate the patient on her insulin regimen and the relevance of adherence.  Patient was discharged home in stable condition on 04-11-2023 with instructions to follow up with her endocrinologist at [facility].  Patient requested a work note prior to discharge.   Labs at discharge Lab Results  Component Value Date   CREATININE 0.93 04/10/2023   BUN 17 04/10/2023   NA 144 04/10/2023   K 4.1 04/10/2023   CL 112 (H) 04/10/2023   CO2 20 (L) 04/10/2023   Lab Results  Component Value Date   WBC 17.7 (H) 04/10/2023   HGB 7.6 (L) 04/10/2023   HCT 24.5 (L) 04/10/2023    MCV 80.9 04/10/2023   PLT 429 (H) 04/10/2023   Lab Results  Component Value Date   ALT 18 01/02/2023   AST 24 01/02/2023   ALKPHOS 219 (H) 01/02/2023   BILITOT 1.4 (H) 01/02/2023   No results found for: "INR", "PROTIME"  Current radiology studies [place] EKG SITE RITE Result Date: 04/09/2023 If Site Rite image not attached, placement could not be confirmed due to current cardiac rhythm.  CT Head Wo Contrast Result Date: 04/09/2023 CLINICAL DATA:  Altered mental status. EXAM: CT HEAD WITHOUT CONTRAST TECHNIQUE: Contiguous axial images were obtained from the base of the skull through the vertex without intravenous contrast. RADIATION DOSE REDUCTION: This exam was performed according to the departmental dose-optimization program which includes automated exposure control, adjustment of the mA and/or kV according to patient size and/or use of iterative reconstruction technique. COMPARISON:  Head CT dated 07/04/2021. FINDINGS: Evaluation of this exam is limited due to motion artifact. Brain: The ventricles and sulci are appropriate size for the patient's age. The gray-white matter discrimination is preserved. There is no acute intracranial hemorrhage. No mass effect or midline shift. No extra-axial fluid collection. Vascular: No hyperdense vessel or unexpected calcification. Skull: Normal. Negative for fracture or focal lesion. Sinuses/Orbits: There is dysconjugate gaze similar to prior CT, likely strabismus. Clinical correlation is recommended. The visualized paranasal sinuses and mastoid air cells are clear. Other: None  IMPRESSION: No acute intracranial pathology. Electronically Signed   By: Elgie Collard M.D.   On: 04/09/2023 15:00    Disposition:  Discharge disposition: 01-Home or Self Care       Discharge Instructions     Call MD for:  persistant dizziness or light-headedness   Complete by: As directed    Diet - low sodium heart healthy   Complete by: As directed    Increase activity  slowly   Complete by: As directed       Dear Dawn [name], It was a pleasure taking care of you at [facility]. You were admitted for hyperglycemia  and treated for DKA. We are discharging you home now that you are doing better. Please follow the following instructions.  PLEASE take your medicines as discussed  Take care,  Dr. Kathleen Lime, MD     Allergies as of 04/11/2023       Reactions   Latex Itching, Swelling, Other (See Comments)   Skin irritation   Tape Other (See Comments)   Skin irritation   Shellfish Allergy Itching, Rash   Scallops specifically        Medication List     TAKE these medications    albuterol 108 (90 Base) MCG/ACT inhaler Commonly known as: VENTOLIN HFA Inhale 2 puffs into the lungs every 6 (six) hours as needed for shortness of breath or wheezing.   Baqsimi Two Pack 3 MG/DOSE Powd Generic drug: Glucagon Place 3 mg into the nose as needed (low blood sugar).   Dexcom G7 Sensor Misc Inject 1 Application into the skin as directed.   escitalopram 20 MG tablet Commonly known as: LEXAPRO Take 20 mg by mouth daily.   ferrous sulfate 324 MG Tbec Take 1 tablet (324 mg total) by mouth every other day.   fluticasone 50 MCG/ACT nasal spray Commonly known as: FLONASE Place 2 sprays into both nostrils daily.   folic acid 1 MG tablet Commonly known as: FOLVITE Take 1 mg by mouth daily.   gabapentin 100 MG capsule Commonly known as: NEURONTIN Take 100 mg by mouth daily at 12 noon.   ibuprofen 800 MG tablet Commonly known as: ADVIL Take 800 mg by mouth every 6 (six) hours as needed for mild pain (pain score 1-3), headache or moderate pain (pain score 4-6).   insulin aspart 100 UNIT/ML FlexPen Commonly known as: NOVOLOG Inject 4 Units into the skin 3 (three) times daily with meals. Until 4/9 when you start your insulin pump; Do not inject if your BG is < 80 mg/dL   insulin degludec 100 UNIT/ML FlexTouch Pen Commonly known as:  [name] Inject 30 Units into the skin daily. Per your Endocrinologist, increase by 2 units every 3 days until fasting BG is 200mg /dL or less What changed:  how much to take additional instructions   insulin lispro 100 UNIT/ML injection Commonly known as: HUMALOG Inject 200 Units into the skin See admin instructions. Insert 200 units into omnipod. Change omnipod every three days   melatonin 3 MG Tabs tablet Take 6 mg by mouth at bedtime.   Omnipod 5 DexG7G6 Intro Gen 5 Kit Use as prescribed What changed:  how much to take how to take this when to take this additional instructions   ondansetron 4 MG disintegrating tablet Commonly known as: ZOFRAN-ODT Take 1 tablet (4 mg total) by mouth every 8 (eight) hours as needed. What changed: reasons to take this   pantoprazole 40 MG tablet Commonly known as: PROTONIX  Take 40 mg by mouth daily.   SUMAtriptan 25 MG tablet Commonly known as: Imitrex Take 0.5 tablets (12.5 mg total) by mouth every 2 (two) hours as needed for migraine. May repeat in 2 hours if headache persists or recurs.   traMADol 50 MG tablet Commonly known as: ULTRAM Take 1 tablet (50 mg total) by mouth every 6 (six) hours as needed for severe pain (pain score 7-10) or moderate pain (pain score 4-6).   Vitamin D (Ergocalciferol) 1.25 MG (50000 UNIT) Caps capsule Commonly known as: DRISDOL Take 50,000 Units by mouth every 7 (seven) days.        Discharged Condition: good  Time spent on discharge greater than 40 minutes.  Vital signs at Discharge. Temp:  [97.7 F (36.5 C)-98.4 F (36.9 C)] 98.4 F (36.9 C) (04/08 0755) Pulse Rate:  [76-109] 96 (04/08 1100) Resp:  [8-37] 17 (04/08 1100) BP: (102-144)/(63-99) 126/89 (04/08 1100) SpO2:  [97 %-100 %] 99 % (04/08 1100)  Office follow up Special Information or instructions. Signed:  Kathleen Lime, MD Internal Medicine, PGY-1 04/11/2023, 12:22 PM [phone]  If no response to pager, please call [phone]  until 1900 After 1900 please call [place] [phone]

## 2023-04-11 NOTE — Progress Notes (Signed)
 Hypoglycemic Event  CBG: 44  Treatment: 8 oz juice/soda  Symptoms: None  Follow-up CBG: Time:1218 CBG Result:54  Possible Reasons for Event: Medication Regimen   Comments/MD notified: This RN notified Dr. Denese Killings of hypoglycemic event. Patient had discharge orders in as well. MD stated to let patient eat something solid and then she could discharge still. The patient ate her meal tray and blood sugar was rechecked again at 12:45 and it was 101. Patient arrived to discharge lodge with belongings.    Dawn [name]

## 2023-05-10 NOTE — ED Provider Notes (Signed)
 [facility] EMERGENCY DEPT  ED Provider Note History   Chief Complaint  Patient presents with  . Hyperglycemia   History of Present Illness HPI  The patient is a 23 year old woman with T1DM who presents to the ED with hyperglycemia.   History is obtained per chart review and EMS documentation as the patient does not participate in our interaction. She is in fetal position with eyes closed and moaning. EMS reports the patient started feeling nauseated, weak and shaky 05/09/23 AM. Developed abdominal pain, vomiting. The fire department's glucometer read low when they checked her BG so they gave her 1-2g oral glucose. When EMS arrived, their glucometer read 324. The patient became uncooperative and stopped answering EMS's questions en route.   Past Medical History:  Diagnosis Date  . ADHD (attention deficit hyperactivity disorder) 12/09/2010  . Diabetes mellitus type 2, uncomplicated (CMS/HHS-HCC)    Past Surgical History:  Procedure Laterality Date  . ankle surger Left   . leg surgery Left   . surgery foot Left    Family History  Problem Relation Age of Onset  . Stroke Father   . High blood pressure (Hypertension) Father   . High blood pressure (Hypertension) Maternal Aunt   . Stroke Paternal Grandmother   . Diabetes Paternal Grandmother   . High blood pressure (Hypertension) Paternal Grandmother   . Asthma Other   . Diabetes Other   . Cancer Other   . High blood pressure (Hypertension) Other    Social History   Socioeconomic History  . Marital status: Single  . Number of children: 0  . Years of education: 92  . Highest education level: Some college, no degree  Occupational History  . Occupation: [employer] in [employer]  Tobacco Use  . Smoking status: Former    Types: Cigarettes  . Smokeless tobacco: Never  Substance and Sexual Activity  . Alcohol use: Not Currently  . Drug use: Never  . Sexual activity: Not Currently  Social History Narrative   03/02/23 Patient  currently lives in an apartment with sister and sisters children.    Social Drivers of Health   Financial Resource Strain: High Risk (04/24/2023)   Overall Financial Resource Strain (CARDIA)   . Difficulty of Paying Living Expenses: Hard  Food Insecurity: Food Insecurity Present (04/24/2023)   Hunger Vital Sign   . Worried About Programme researcher, broadcasting/film/video in the Last Year: Often true   . Ran Out of Food in the Last Year: Sometimes true  Transportation Needs: Unmet Transportation Needs (04/24/2023)   PRAPARE - Transportation   . Lack of Transportation (Medical): Yes   . Lack of Transportation (Non-Medical): No  Physical Activity: Inactive (04/24/2023)   Exercise Vital Sign   . Days of Exercise per Week: 0 days   . Minutes of Exercise per Session: 0 min  Stress: Stress Concern Present (04/24/2023)   [employer] of Occupational Health - Occupational Stress Questionnaire   . Feeling of Stress : Very much  Social Connections: Moderately Isolated (04/24/2023)   Social Connection and Isolation Panel [NHANES]   . Frequency of Communication with Friends and Family: Three times a week   . Frequency of Social Gatherings with Friends and Family: Twice a week   . Attends Religious Services: 1 to 4 times per year   . Active Member of Clubs or Organizations: No   . Attends Banker Meetings: Never   . Marital Status: Never married  Housing Stability: High Risk (04/24/2023)  Housing Stability Vital Sign   . Unable to Pay for Housing in the Last Year: Yes   . Number of Times Moved in the Last Year: 5   . Homeless in the Last Year: Yes   Review of Systems  Unable to perform ROS: Acuity of condition     Physical Exam  BP (!) 139/100 (BP Location: Right upper arm, Patient Position: Sitting)   Pulse (!) 127   Temp 36.8 C (98.3 F)   Resp 20   Wt 49.9 kg (110 lb)   SpO2 100%   BMI 21.48 kg/m  Physical Exam  General: Vital signs reviewed. Pt is toxic appearing and  uncomfortable Head: Normocephalic. No obvious trauma Eyes: EOMI, anicteric sclerae ENT: Airway is patent. Pt handling secretions. Neck: Supple, no JVD. Cardiovascular: Regular rhythm, tachy rate Respiratory: Normal respiratory effort, no audible abnormal breath sounds Abdomen: Soft, non-distended. Generalized discomfort with palpation. No rebound or guarding. Dermatologic: No rashes on exposed skin, warm and dry Musculoskeletal: Moving all extremities. No edema. Good distal color. Neurologic: GCS 14, grossly no new FND. Face symmetrical with equal facial expressions and eye movement. Psychiatric: Calm and cooperative.    Procedures  Procedures   Medical Decision Making  MDM  The patient isa 23 year old woman with T1DM who presents to the ED with nausea, vomiting, abdominal pain and hyperglycemia. Labs are consistent with DKA. The patient is started on an insulin  drip. Potassium is within normal limits though is hemolyzed, suspect actual value is lower.  Will start potassium IV in anticipation of rapid intracellular shifting with IV insulin .  Updates: - repeat potassium 4.8, IV potassium has been paused for now. Magnesium  is normal - s/p 2L IVF, pending another liter at this time - WBC 21.7, likely hemo-concentrated due to dehydration though considering infectious etiology leading to current DKA presentation. Pending infectious work up - urinalysis, chest xray - negative pregnancy test - pH normal though compensated. Lactate 3.7, pending repeat - BG remains high, now 467 - general medicine consulted for admission    I reviewed all laboratory and radiology tests. I interpreted them and considered them in the medical decision making process.   - Patient meets criteria for admission. Discussed clinical impression with patient and advised admission. Patient demonstrates understanding and agrees with the plan. Questions invited and answered. Discussed with inpatient team who will admit  patient onto their service. Bed request has been placed.   Results Reviewed: Labs: Labs Reviewed  COMPREHENSIVE METABOLIC PANEL (CMP) - Abnormal; Notable for the following components:      Result Value   Sodium 133 (*)    Carbon Dioxide (CO2) 17 (*)    Urea Nitrogen (BUN) 33 (*)    Creatinine 1.3 (*)    Glucose 412 (*)    Alk Phos (Alkaline Phosphatase) 193 (*)    Protein, Total 8.8 (*)    Anion Gap 16 (*)    All other components within normal limits  COMPLETE BLOOD COUNT (CBC) WITH DIFFERENTIAL - Abnormal; Notable for the following components:   WBC (White Blood Cell Count) 21.7 (*)    Hemoglobin 10.5 (*)    Hematocrit 33.9 (*)    MCV (Mean Corpuscular Volume) 79 (*)    MCH (Mean Corpuscular Hemoglobin) 24.4 (*)    RDW-CV (Red Cell Distribution Width) 19.4 (*)    Neutrophil Count 18.7 (*)    Neutrophil % 86.1 (*)    Lymphocyte % 7.1 (*)    Monocyte Count 1.3 (*)  Immature Granulocyte Count 0.17 (*)    Immature Granulocyte % 0.8 (*)    All other components within normal limits  SHOCK PANEL, VENOUS - Abnormal; Notable for the following components:   PCO2, Venous 29 (*)    Base Excess, Venous -7 (*)    Bicarbonate, Venous 17 (*)    CO2 TOTAL, VENOUS 18 (*)    Hemoglobin, Venous 10.1 (*)    Hematocrit, Venous 30.0 (*)    % O2 Hemoglobin, Venous 49.2 (*)    % Methemoglobin, Venous 0.0 (*)    Glucose, Nonfasting, Venous 578 (*)    Lactate, Venous 3.7 (*)    All other components within normal limits  POC GLUCOSE - Abnormal; Notable for the following components:   POC Glucose 333 (*)    All other components within normal limits   Narrative:    POC TEST(S) ABOVE PERFORMED AT THE PATIENT CARE LOCATION AND OVERSEEN BY THE [facility] POCT PROGRAM.  POC GLUCOSE - Abnormal; Notable for the following components:   POC Glucose 365 (*)    All other components within normal limits   Narrative:    POC TEST(S) ABOVE PERFORMED AT THE PATIENT CARE LOCATION AND OVERSEEN BY THE [facility] POCT  PROGRAM.  POC GLUCOSE - Abnormal; Notable for the following components:   POC Glucose 461 (*)    All other components within normal limits   Narrative:    POC TEST(S) ABOVE PERFORMED AT THE PATIENT CARE LOCATION AND OVERSEEN BY THE [facility] POCT PROGRAM.  POC GLUCOSE - Abnormal; Notable for the following components:   POC Glucose 467 (*)    All other components within normal limits   Narrative:    POC TEST(S) ABOVE PERFORMED AT THE PATIENT CARE LOCATION AND OVERSEEN BY THE [facility] POCT PROGRAM.  POTASSIUM - Normal  MAGNESIUM  - Normal  HCG BLOOD QUANTITATIVE, PREGNANCY TEST   Narrative:    Interpretive Data :                                              Adult Female:                 <5     mIU/mL                            Adult Female:  Negative       <5     mIU/mL                           Indeterminate  5-25   mIU/mL                           Positive       >25    mIU/mL                            LACTATE, REPEAT  CULTURE, URINE, ROUTINE WITH PYURIA SCREEN (REFLEXED FROM URINALYSIS)  POC URINE HCG PREGNANCY TEST  POC GLUCOSE  POC GLUCOSE  POC GLUCOSE  POC GLUCOSE    Pertinent Imaging (reviewed by myself): X-ray chest single view portable  ED Interpretation  1. Cardiac and mediastinal contours are within normal limits.  2. No focal consolidation.  3. No pleural effusion. No pneumothorax.  Please follow-up final report in the AM.            ______________________________________________          Medications Administered in the Emergency Department   lactated ringers  bolus 2,000 mL (has no administration in time range) potassium chloride  in 0.9% sodium chloride  IVPB 40 mEq/500 mL (has no administration in time range) dextrose  50% in water  solution 12.5-25 g (has no administration in time range) glucagon  (GLUCAGEN ) injection 1 mg (has no administration in time range) insulin  REGULAR human (MYXREDLIN ) in sodium chloride  0.9% infusion 100 units/100 mL (has no  administration in time range) ondansetron  (PF) (ZOFRAN ) injection 4 mg (has no administration in time range) ondansetron  (ZOFRAN -ODT) disintegrating tablet 4 mg (4 mg Oral Given 05/10/23 0330) lactated ringers  bolus 2,000 mL (2,000 mLs Intravenous New Bag 05/10/23 0354)   ED Clinical Impression  1. Altered mental status, unspecified altered mental status type   2. Diabetic ketoacidosis without coma associated with type 1 diabetes mellitus (CMS/HHS-HCC)   3. Leukocytosis, unspecified type                Daughtridge, Candace, PA 05/19/23 1347

## 2023-07-18 ENCOUNTER — Inpatient Hospital Stay (HOSPITAL_COMMUNITY)
Admission: EM | Admit: 2023-07-18 | Discharge: 2023-07-20 | DRG: 637 | Disposition: A | Discharge status: Home / Self Care | Payer: MEDICAID | Attending: Internal Medicine | Admitting: Internal Medicine

## 2023-07-18 ENCOUNTER — Other Ambulatory Visit: Payer: Self-pay

## 2023-07-18 ENCOUNTER — Encounter (HOSPITAL_COMMUNITY): Payer: Self-pay | Admitting: Emergency Medicine

## 2023-07-18 DIAGNOSIS — E1010 Type 1 diabetes mellitus with ketoacidosis without coma: Principal | ICD-10-CM | POA: Diagnosis present

## 2023-07-18 DIAGNOSIS — Z91040 Latex allergy status: Secondary | ICD-10-CM

## 2023-07-18 DIAGNOSIS — E559 Vitamin D deficiency, unspecified: Secondary | ICD-10-CM | POA: Diagnosis present

## 2023-07-18 DIAGNOSIS — E1065 Type 1 diabetes mellitus with hyperglycemia: Secondary | ICD-10-CM

## 2023-07-18 DIAGNOSIS — R112 Nausea with vomiting, unspecified: Principal | ICD-10-CM

## 2023-07-18 DIAGNOSIS — Z91048 Other nonmedicinal substance allergy status: Secondary | ICD-10-CM

## 2023-07-18 DIAGNOSIS — G9341 Metabolic encephalopathy: Secondary | ICD-10-CM | POA: Diagnosis present

## 2023-07-18 DIAGNOSIS — E876 Hypokalemia: Secondary | ICD-10-CM | POA: Diagnosis not present

## 2023-07-18 DIAGNOSIS — Z833 Family history of diabetes mellitus: Secondary | ICD-10-CM

## 2023-07-18 DIAGNOSIS — D509 Iron deficiency anemia, unspecified: Secondary | ICD-10-CM | POA: Diagnosis present

## 2023-07-18 DIAGNOSIS — Z8249 Family history of ischemic heart disease and other diseases of the circulatory system: Secondary | ICD-10-CM

## 2023-07-18 DIAGNOSIS — N179 Acute kidney failure, unspecified: Secondary | ICD-10-CM | POA: Diagnosis present

## 2023-07-18 DIAGNOSIS — Z91013 Allergy to seafood: Secondary | ICD-10-CM

## 2023-07-18 DIAGNOSIS — Z9641 Presence of insulin pump (external) (internal): Secondary | ICD-10-CM | POA: Diagnosis present

## 2023-07-18 DIAGNOSIS — Z79899 Other long term (current) drug therapy: Secondary | ICD-10-CM

## 2023-07-18 DIAGNOSIS — E1110 Type 2 diabetes mellitus with ketoacidosis without coma: Secondary | ICD-10-CM | POA: Diagnosis present

## 2023-07-18 DIAGNOSIS — Z91199 Patient's noncompliance with other medical treatment and regimen due to unspecified reason: Secondary | ICD-10-CM

## 2023-07-18 DIAGNOSIS — Z794 Long term (current) use of insulin: Secondary | ICD-10-CM

## 2023-07-18 DIAGNOSIS — E10649 Type 1 diabetes mellitus with hypoglycemia without coma: Secondary | ICD-10-CM | POA: Diagnosis not present

## 2023-07-18 LAB — CBC WITH DIFFERENTIAL/PLATELET
Abs Immature Granulocytes: 0.06 K/uL (ref 0.00–0.07)
Basophils Absolute: 0.1 K/uL (ref 0.0–0.1)
Basophils Relative: 0 %
Eosinophils Absolute: 0 K/uL (ref 0.0–0.5)
Eosinophils Relative: 0 %
HCT: 35.3 % — ABNORMAL LOW (ref 36.0–46.0)
Hemoglobin: 11 g/dL — ABNORMAL LOW (ref 12.0–15.0)
Immature Granulocytes: 0 %
Lymphocytes Relative: 30 %
Lymphs Abs: 4.1 K/uL — ABNORMAL HIGH (ref 0.7–4.0)
MCH: 25.2 pg — ABNORMAL LOW (ref 26.0–34.0)
MCHC: 31.2 g/dL (ref 30.0–36.0)
MCV: 81 fL (ref 80.0–100.0)
Monocytes Absolute: 0.9 K/uL (ref 0.1–1.0)
Monocytes Relative: 7 %
Neutro Abs: 8.3 K/uL — ABNORMAL HIGH (ref 1.7–7.7)
Neutrophils Relative %: 63 %
Platelets: 405 K/uL — ABNORMAL HIGH (ref 150–400)
RBC: 4.36 MIL/uL (ref 3.87–5.11)
RDW: 17.6 % — ABNORMAL HIGH (ref 11.5–15.5)
WBC: 13.3 K/uL — ABNORMAL HIGH (ref 4.0–10.5)
nRBC: 0 % (ref 0.0–0.2)

## 2023-07-18 LAB — I-STAT CHEM 8, ED
BUN: 33 mg/dL — ABNORMAL HIGH (ref 6–20)
Calcium, Ion: 1.18 mmol/L (ref 1.15–1.40)
Chloride: 108 mmol/L (ref 98–111)
Creatinine, Ser: 0.8 mg/dL (ref 0.44–1.00)
Glucose, Bld: 201 mg/dL — ABNORMAL HIGH (ref 70–99)
HCT: 37 % (ref 36.0–46.0)
Hemoglobin: 12.6 g/dL (ref 12.0–15.0)
Potassium: 3.7 mmol/L (ref 3.5–5.1)
Sodium: 139 mmol/L (ref 135–145)
TCO2: 13 mmol/L — ABNORMAL LOW (ref 22–32)

## 2023-07-18 LAB — CBG MONITORING, ED: Glucose-Capillary: 191 mg/dL — ABNORMAL HIGH (ref 70–99)

## 2023-07-18 MED ORDER — ONDANSETRON 4 MG PO TBDP
4.0000 mg | ORAL_TABLET | Freq: Once | ORAL | Status: DC | PRN
  Filled 2023-07-18: qty 1

## 2023-07-18 NOTE — ED Triage Notes (Signed)
 Patient reports emesis and hot flashes x1 day. History of type 1 diabetes - has been unable to check sugars at home. Unable to keep PO food/fluids down.

## 2023-07-19 ENCOUNTER — Inpatient Hospital Stay (HOSPITAL_COMMUNITY): Payer: MEDICAID

## 2023-07-19 DIAGNOSIS — E876 Hypokalemia: Secondary | ICD-10-CM | POA: Diagnosis not present

## 2023-07-19 DIAGNOSIS — E1010 Type 1 diabetes mellitus with ketoacidosis without coma: Principal | ICD-10-CM

## 2023-07-19 DIAGNOSIS — Z8249 Family history of ischemic heart disease and other diseases of the circulatory system: Secondary | ICD-10-CM | POA: Diagnosis not present

## 2023-07-19 DIAGNOSIS — E10649 Type 1 diabetes mellitus with hypoglycemia without coma: Secondary | ICD-10-CM | POA: Diagnosis not present

## 2023-07-19 DIAGNOSIS — Z79899 Other long term (current) drug therapy: Secondary | ICD-10-CM | POA: Diagnosis not present

## 2023-07-19 DIAGNOSIS — N179 Acute kidney failure, unspecified: Secondary | ICD-10-CM | POA: Diagnosis present

## 2023-07-19 DIAGNOSIS — D509 Iron deficiency anemia, unspecified: Secondary | ICD-10-CM

## 2023-07-19 DIAGNOSIS — G9341 Metabolic encephalopathy: Secondary | ICD-10-CM | POA: Diagnosis present

## 2023-07-19 DIAGNOSIS — Z91013 Allergy to seafood: Secondary | ICD-10-CM | POA: Diagnosis not present

## 2023-07-19 DIAGNOSIS — Z794 Long term (current) use of insulin: Secondary | ICD-10-CM | POA: Diagnosis not present

## 2023-07-19 DIAGNOSIS — Z9641 Presence of insulin pump (external) (internal): Secondary | ICD-10-CM | POA: Diagnosis present

## 2023-07-19 DIAGNOSIS — Z91048 Other nonmedicinal substance allergy status: Secondary | ICD-10-CM | POA: Diagnosis not present

## 2023-07-19 DIAGNOSIS — Z833 Family history of diabetes mellitus: Secondary | ICD-10-CM | POA: Diagnosis not present

## 2023-07-19 DIAGNOSIS — Z91199 Patient's noncompliance with other medical treatment and regimen due to unspecified reason: Secondary | ICD-10-CM | POA: Diagnosis not present

## 2023-07-19 DIAGNOSIS — E1110 Type 2 diabetes mellitus with ketoacidosis without coma: Secondary | ICD-10-CM | POA: Diagnosis present

## 2023-07-19 DIAGNOSIS — E559 Vitamin D deficiency, unspecified: Secondary | ICD-10-CM | POA: Diagnosis present

## 2023-07-19 DIAGNOSIS — Z91040 Latex allergy status: Secondary | ICD-10-CM | POA: Diagnosis not present

## 2023-07-19 LAB — I-STAT VENOUS BLOOD GAS, ED
Acid-base deficit: 7 mmol/L — ABNORMAL HIGH (ref 0.0–2.0)
Bicarbonate: 18.3 mmol/L — ABNORMAL LOW (ref 20.0–28.0)
Calcium, Ion: 1.19 mmol/L (ref 1.15–1.40)
HCT: 39 % (ref 36.0–46.0)
Hemoglobin: 13.3 g/dL (ref 12.0–15.0)
O2 Saturation: 58 %
Potassium: 4.2 mmol/L (ref 3.5–5.1)
Sodium: 139 mmol/L (ref 135–145)
TCO2: 19 mmol/L — ABNORMAL LOW (ref 22–32)
pCO2, Ven: 35 mmHg — ABNORMAL LOW (ref 44–60)
pH, Ven: 7.326 (ref 7.25–7.43)
pO2, Ven: 32 mmHg (ref 32–45)

## 2023-07-19 LAB — RAPID URINE DRUG SCREEN, HOSP PERFORMED
Amphetamines: NOT DETECTED
Barbiturates: NOT DETECTED
Benzodiazepines: NOT DETECTED
Cocaine: NOT DETECTED
Opiates: NOT DETECTED
Tetrahydrocannabinol: POSITIVE — AB

## 2023-07-19 LAB — GLUCOSE, CAPILLARY
Glucose-Capillary: 177 mg/dL — ABNORMAL HIGH (ref 70–99)
Glucose-Capillary: 183 mg/dL — ABNORMAL HIGH (ref 70–99)
Glucose-Capillary: 177 mg/dL — ABNORMAL HIGH (ref 70–99)
Glucose-Capillary: 183 mg/dL — ABNORMAL HIGH (ref 70–99)
Glucose-Capillary: 186 mg/dL — ABNORMAL HIGH (ref 70–99)
Glucose-Capillary: 189 mg/dL — ABNORMAL HIGH (ref 70–99)
Glucose-Capillary: 196 mg/dL — ABNORMAL HIGH (ref 70–99)
Glucose-Capillary: 205 mg/dL — ABNORMAL HIGH (ref 70–99)
Glucose-Capillary: 180 mg/dL — ABNORMAL HIGH (ref 70–99)
Glucose-Capillary: 164 mg/dL — ABNORMAL HIGH (ref 70–99)
Glucose-Capillary: 185 mg/dL — ABNORMAL HIGH (ref 70–99)
Glucose-Capillary: 161 mg/dL — ABNORMAL HIGH (ref 70–99)
Glucose-Capillary: 176 mg/dL — ABNORMAL HIGH (ref 70–99)
Glucose-Capillary: 161 mg/dL — ABNORMAL HIGH (ref 70–99)
Glucose-Capillary: 141 mg/dL — ABNORMAL HIGH (ref 70–99)
Glucose-Capillary: 44 mg/dL — CL (ref 70–99)
Glucose-Capillary: 52 mg/dL — ABNORMAL LOW (ref 70–99)
Glucose-Capillary: 137 mg/dL — ABNORMAL HIGH (ref 70–99)
Glucose-Capillary: 143 mg/dL — ABNORMAL HIGH (ref 70–99)

## 2023-07-19 LAB — BASIC METABOLIC PANEL WITH GFR
Anion gap: 18 — ABNORMAL HIGH (ref 5–15)
Anion gap: 20 — ABNORMAL HIGH (ref 5–15)
Anion gap: 16 — ABNORMAL HIGH (ref 5–15)
Anion gap: 14 (ref 5–15)
Anion gap: 11 (ref 5–15)
BUN: 33 mg/dL — ABNORMAL HIGH (ref 6–20)
BUN: 26 mg/dL — ABNORMAL HIGH (ref 6–20)
BUN: 22 mg/dL — ABNORMAL HIGH (ref 6–20)
BUN: 20 mg/dL (ref 6–20)
BUN: 15 mg/dL (ref 6–20)
CO2: 16 mmol/L — ABNORMAL LOW (ref 22–32)
CO2: 16 mmol/L — ABNORMAL LOW (ref 22–32)
CO2: 17 mmol/L — ABNORMAL LOW (ref 22–32)
CO2: 20 mmol/L — ABNORMAL LOW (ref 22–32)
CO2: 23 mmol/L (ref 22–32)
Calcium: 9.8 mg/dL (ref 8.9–10.3)
Calcium: 9.6 mg/dL (ref 8.9–10.3)
Calcium: 9.3 mg/dL (ref 8.9–10.3)
Calcium: 9.4 mg/dL (ref 8.9–10.3)
Calcium: 9.8 mg/dL (ref 8.9–10.3)
Chloride: 103 mmol/L (ref 98–111)
Chloride: 103 mmol/L (ref 98–111)
Chloride: 104 mmol/L (ref 98–111)
Chloride: 104 mmol/L (ref 98–111)
Chloride: 107 mmol/L (ref 98–111)
Creatinine, Ser: 1.21 mg/dL — ABNORMAL HIGH (ref 0.44–1.00)
Creatinine, Ser: 0.89 mg/dL (ref 0.44–1.00)
Creatinine, Ser: 0.8 mg/dL (ref 0.44–1.00)
Creatinine, Ser: 0.87 mg/dL (ref 0.44–1.00)
Creatinine, Ser: 0.81 mg/dL (ref 0.44–1.00)
GFR, Estimated: >60 mL/min (ref >60)
GFR, Estimated: >60 mL/min (ref >60)
GFR, Estimated: >60 mL/min (ref >60)
GFR, Estimated: >60 mL/min (ref >60)
GFR, Estimated: >60 mL/min (ref >60)
Glucose, Bld: 166 mg/dL — ABNORMAL HIGH (ref 70–99)
Glucose, Bld: 184 mg/dL — ABNORMAL HIGH (ref 70–99)
Glucose, Bld: 205 mg/dL — ABNORMAL HIGH (ref 70–99)
Glucose, Bld: 177 mg/dL — ABNORMAL HIGH (ref 70–99)
Glucose, Bld: 93 mg/dL (ref 70–99)
Potassium: 3.8 mmol/L (ref 3.5–5.1)
Potassium: 3.8 mmol/L (ref 3.5–5.1)
Potassium: 3.7 mmol/L (ref 3.5–5.1)
Potassium: 4.1 mmol/L (ref 3.5–5.1)
Potassium: 4 mmol/L (ref 3.5–5.1)
Sodium: 137 mmol/L (ref 135–145)
Sodium: 139 mmol/L (ref 135–145)
Sodium: 137 mmol/L (ref 135–145)
Sodium: 138 mmol/L (ref 135–145)
Sodium: 141 mmol/L (ref 135–145)

## 2023-07-19 LAB — COMPREHENSIVE METABOLIC PANEL WITH GFR
ALT: 21 U/L (ref 0–44)
AST: 26 U/L (ref 15–41)
Albumin: 4.1 g/dL (ref 3.5–5.0)
Alkaline Phosphatase: 165 U/L — ABNORMAL HIGH (ref 38–126)
Anion gap: 20 — ABNORMAL HIGH (ref 5–15)
BUN: 34 mg/dL — ABNORMAL HIGH (ref 6–20)
CO2: 13 mmol/L — ABNORMAL LOW (ref 22–32)
Calcium: 9.7 mg/dL (ref 8.9–10.3)
Chloride: 105 mmol/L (ref 98–111)
Creatinine, Ser: 1.21 mg/dL — ABNORMAL HIGH (ref 0.44–1.00)
GFR, Estimated: >60 mL/min (ref >60)
Glucose, Bld: 196 mg/dL — ABNORMAL HIGH (ref 70–99)
Potassium: 3.7 mmol/L (ref 3.5–5.1)
Sodium: 138 mmol/L (ref 135–145)
Total Bilirubin: 1.2 mg/dL (ref 0.0–1.2)
Total Protein: 8.9 g/dL — ABNORMAL HIGH (ref 6.5–8.1)

## 2023-07-19 LAB — CBG MONITORING, ED
Glucose-Capillary: 156 mg/dL — ABNORMAL HIGH (ref 70–99)
Glucose-Capillary: 178 mg/dL — ABNORMAL HIGH (ref 70–99)

## 2023-07-19 LAB — BETA-HYDROXYBUTYRIC ACID
Beta-Hydroxybutyric Acid: 4.48 mmol/L — ABNORMAL HIGH (ref 0.05–0.27)
Beta-Hydroxybutyric Acid: 3.64 mmol/L — ABNORMAL HIGH (ref 0.05–0.27)
Beta-Hydroxybutyric Acid: 3.14 mmol/L — ABNORMAL HIGH (ref 0.05–0.27)
Beta-Hydroxybutyric Acid: 2.31 mmol/L — ABNORMAL HIGH (ref 0.05–0.27)
Beta-Hydroxybutyric Acid: 0.68 mmol/L — ABNORMAL HIGH (ref 0.05–0.27)

## 2023-07-19 LAB — MRSA NEXT GEN BY PCR, NASAL: MRSA by PCR Next Gen: DETECTED — AB

## 2023-07-19 LAB — HEMOGLOBIN A1C
Hgb A1c MFr Bld: 11.7 % — ABNORMAL HIGH (ref 4.8–5.6)
Mean Plasma Glucose: 289.09 mg/dL

## 2023-07-19 LAB — HCG, SERUM, QUALITATIVE
Preg, Serum: NEGATIVE
Preg, Serum: NEGATIVE

## 2023-07-19 LAB — PHOSPHORUS: Phosphorus: 3.5 mg/dL (ref 2.5–4.6)

## 2023-07-19 LAB — MAGNESIUM: Magnesium: 2.1 mg/dL (ref 1.7–2.4)

## 2023-07-19 MED ORDER — INSULIN ASPART 100 UNIT/ML IJ SOLN
0.0000 [IU] | Freq: Three times a day (TID) | INTRAMUSCULAR | Status: DC
  Administered 2023-07-19: 1 [IU] via SUBCUTANEOUS

## 2023-07-19 MED ORDER — INSULIN ASPART 100 UNIT/ML IJ SOLN
3.0000 [IU] | Freq: Three times a day (TID) | INTRAMUSCULAR | Status: DC
  Administered 2023-07-19: 3 [IU] via SUBCUTANEOUS

## 2023-07-19 MED ORDER — FAMOTIDINE IN NACL 20-0.9 MG/50ML-% IV SOLN
20.0000 mg | Freq: Every day | INTRAVENOUS | Status: DC
  Administered 2023-07-19 – 2023-07-20 (×2): 20 mg via INTRAVENOUS
  Filled 2023-07-19 (×2): qty 50

## 2023-07-19 MED ORDER — INSULIN GLARGINE-YFGN 100 UNIT/ML ~~LOC~~ SOLN
20.0000 [IU] | Freq: Every day | SUBCUTANEOUS | Status: DC
  Administered 2023-07-19: 20 [IU] via SUBCUTANEOUS
  Filled 2023-07-19 (×2): qty 0.2

## 2023-07-19 MED ORDER — DEXTROSE 50 % IV SOLN: 0.0000 mL | INTRAVENOUS | Status: DC | PRN

## 2023-07-19 MED ORDER — SODIUM CHLORIDE 0.9 % IV SOLN
200.0000 mg | Freq: Once | INTRAVENOUS | Status: AC
  Administered 2023-07-19: 200 mg via INTRAVENOUS
  Filled 2023-07-19 (×2): qty 10

## 2023-07-19 MED ORDER — SODIUM CHLORIDE 0.9 % IV SOLN
12.5000 mg | Freq: Four times a day (QID) | INTRAVENOUS | Status: DC | PRN
  Administered 2023-07-19: 12.5 mg via INTRAVENOUS
  Filled 2023-07-19 (×2): qty 0.5

## 2023-07-19 MED ORDER — ONDANSETRON HCL 4 MG/2ML IJ SOLN
4.0000 mg | Freq: Once | INTRAMUSCULAR | Status: AC
  Administered 2023-07-19: 4 mg via INTRAVENOUS
  Filled 2023-07-19: qty 2

## 2023-07-19 MED ORDER — HYDRALAZINE HCL 20 MG/ML IJ SOLN
10.0000 mg | INTRAMUSCULAR | Status: DC | PRN
  Administered 2023-07-19: 10 mg via INTRAVENOUS
  Filled 2023-07-19: qty 1

## 2023-07-19 MED ORDER — POTASSIUM CHLORIDE 10 MEQ/100ML IV SOLN
10.0000 meq | INTRAVENOUS | Status: DC
  Administered 2023-07-19: 10 meq via INTRAVENOUS
  Filled 2023-07-19 (×2): qty 100

## 2023-07-19 MED ORDER — INSULIN ASPART 100 UNIT/ML IJ SOLN: 0.0000 [IU] | Freq: Every day | INTRAMUSCULAR | Status: DC

## 2023-07-19 MED ORDER — VITAMIN D (ERGOCALCIFEROL) 1.25 MG (50000 UNIT) PO CAPS
50000.0000 [IU] | ORAL_CAPSULE | ORAL | Status: DC
  Filled 2023-07-19: qty 1

## 2023-07-19 MED ORDER — POLYETHYLENE GLYCOL 3350 17 G PO PACK: 17.0000 g | PACK | Freq: Every day | ORAL | Status: DC | PRN

## 2023-07-19 MED ORDER — ONDANSETRON HCL 4 MG/2ML IJ SOLN
4.0000 mg | INTRAMUSCULAR | Status: DC | PRN
  Administered 2023-07-19: 4 mg via INTRAVENOUS

## 2023-07-19 MED ORDER — ACETAMINOPHEN 325 MG PO TABS
650.0000 mg | ORAL_TABLET | ORAL | Status: DC | PRN
  Administered 2023-07-20: 650 mg via ORAL
  Filled 2023-07-19: qty 2

## 2023-07-19 MED ORDER — LACTATED RINGERS IV SOLN: INTRAVENOUS | Status: DC

## 2023-07-19 MED ORDER — ACETAMINOPHEN 500 MG PO TABS: 1000.0000 mg | ORAL_TABLET | Freq: Once | ORAL | Status: DC

## 2023-07-19 MED ORDER — DEXTROSE IN LACTATED RINGERS 5 % IV SOLN: INTRAVENOUS | Status: DC

## 2023-07-19 MED ORDER — POTASSIUM CHLORIDE CRYS ER 20 MEQ PO TBCR
20.0000 meq | EXTENDED_RELEASE_TABLET | Freq: Once | ORAL | Status: AC
  Administered 2023-07-19: 20 meq via ORAL
  Filled 2023-07-19: qty 1

## 2023-07-19 MED ORDER — POTASSIUM CHLORIDE 10 MEQ/100ML IV SOLN
10.0000 meq | INTRAVENOUS | Status: AC
  Administered 2023-07-19: 10 meq via INTRAVENOUS

## 2023-07-19 MED ORDER — LACTATED RINGERS IV BOLUS
20.0000 mL/kg | Freq: Once | INTRAVENOUS | Status: AC
  Administered 2023-07-19: 1000 mL via INTRAVENOUS

## 2023-07-19 MED ORDER — MUPIROCIN 2 % EX OINT
1.0000 | TOPICAL_OINTMENT | Freq: Two times a day (BID) | CUTANEOUS | Status: DC
  Administered 2023-07-19 – 2023-07-20 (×2): 1 via NASAL
  Filled 2023-07-19 (×2): qty 22

## 2023-07-19 MED ORDER — ONDANSETRON HCL 4 MG/2ML IJ SOLN
4.0000 mg | Freq: Four times a day (QID) | INTRAMUSCULAR | Status: DC | PRN
  Administered 2023-07-19: 4 mg via INTRAVENOUS
  Filled 2023-07-19: qty 2

## 2023-07-19 MED ORDER — ENOXAPARIN SODIUM 30 MG/0.3ML IJ SOSY
30.0000 mg | PREFILLED_SYRINGE | INTRAMUSCULAR | Status: DC
  Administered 2023-07-19: 30 mg via SUBCUTANEOUS
  Filled 2023-07-19 (×2): qty 0.3

## 2023-07-19 MED ORDER — CHLORHEXIDINE GLUCONATE CLOTH 2 % EX PADS
6.0000 | MEDICATED_PAD | Freq: Every day | CUTANEOUS | Status: DC
  Administered 2023-07-19: 6 via TOPICAL

## 2023-07-19 MED ORDER — DOCUSATE SODIUM 100 MG PO CAPS: 100.0000 mg | ORAL_CAPSULE | Freq: Two times a day (BID) | ORAL | Status: DC | PRN

## 2023-07-19 MED ORDER — DEXTROSE IN LACTATED RINGERS 5 % IV SOLN
INTRAVENOUS | Status: DC
Start: 2023-07-19 — End: 2023-07-19

## 2023-07-19 MED ORDER — INSULIN REGULAR(HUMAN) IN NACL 100-0.9 UT/100ML-% IV SOLN
INTRAVENOUS | Status: DC
  Administered 2023-07-19: 1.5 [IU]/h via INTRAVENOUS
  Filled 2023-07-19: qty 100
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
## 2023-07-19 NOTE — ED Notes (Signed)
 Admitting Provider at bedside.

## 2023-07-19 NOTE — Inpatient Diabetes Management (Signed)
 Inpatient Diabetes Program Recommendations  AACE/ADA: New Consensus Statement on Inpatient Glycemic Control (2015)  Target Ranges:  Prepandial:   less than 140 mg/dL      Peak postprandial:   less than 180 mg/dL (1-2 hours)      Critically ill patients:  140 - 180 mg/dL   Lab Results  Component Value Date   GLUCAP 161 (H) 07/19/2023   HGBA1C 11.7 (H) 07/19/2023    Review of Glycemic Control  Diabetes history: DM1 Outpatient Diabetes medications: [name]  22 units daily, Novolog  4 units TID Current orders for Inpatient glycemic control: IV insulin  per [facility] for DKA, OmniPod   HgbA1C - 11.7% BHB 2.31  Inpatient Diabetes Program Recommendations:    Pt sleeping on and off. Per RN recommend seeing in am. Familiar with pt from previous admissions.  Continue IV insulin  until criteria met for discontinuation.  Will f/u in am.  Thank you. Shona Brandy, RD, LDN, CDCES Inpatient Diabetes Coordinator [phone]

## 2023-07-19 NOTE — ED Notes (Signed)
 CCMD called to place pt on monitor

## 2023-07-19 NOTE — Progress Notes (Signed)
 eLink Physician-Brief Progress Note Patient Name: Dawn [name] DOB: [birth year] MRN: [id number]   Date of Service  07/19/2023  HPI/Events of Note  23 Y/O with type 1 DM poorly controlled.admitted with DKA.  Transitioning off of insulin  drip but has poor oral intake with hypoglycemic episode  eICU Interventions  Discontinue insulin  drip, reduce D5 LR rate to 50 cc an hour   0054 - abdominal pain rated 8/10; not responsive to tylenol . Start home tramadol   Intervention Category Minor Interventions: Routine modifications to care plan (e.g. PRN medications for pain, fever)  Deondre Marinaro 07/19/2023, 9:05 PM

## 2023-07-19 NOTE — Progress Notes (Addendum)
 eLink Physician-Brief Progress Note Patient Name: Viviene Thurston DOB: 08/03/2000 MRN: [id number]   Date of Service  07/19/2023  HPI/Events of Note  23 year old woman with a history of poorly controlled type 1 diabetes mellitus. She has had over numerous admissions in the last 12 months or ED visits related to her diabetes. Presents with DKA   Vitals, laboratory studies, and imaging reviewed and consistent with resolving acidosis.  eICU Interventions  Continue DKA protocol with aggressive rehydration, electrolyte repletion, and insulin  infusion.  DVT prophylaxis with enoxaparin  GI prophylaxis not indicated.  Famotidine  in place of home pantoprazole .   0515 - MRSA decontamination orders  0545 -patient still had her insulin  pump on for short period of time while IV insulin  was infusing.  Per orders, If present, discontinue Insulin  Pump after IV Insulin  is initiated. (Order [id number])   Intervention Category Evaluation Type: New Patient Evaluation  Radley Teston 07/19/2023, 3:47 AM
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
## 2023-07-19 NOTE — ED Provider Notes (Signed)
 [place] [facility] DE[facility]MENT AT [facility] Provider Note   CSN: [id number] Arrival date & time: 07/18/23  2259     Patient presents with: Emesis   Dawn [name] is a 23 y.o. female.   The history is provided by a friend. The history is limited by the condition of the patient.  Emesis Severity:  Severe Duration:  1 day Timing:  Intermittent Quality:  Stomach contents Progression:  Worsening Chronicity:  New Recent urination:  Normal Context: not post-tussive   Relieved by:  Nothing Worsened by:  Nothing Ineffective treatments:  None tried Associated symptoms: no diarrhea and no fever   Risk factors: no alcohol use        Prior to Admission medications   Medication Sig Start Date End Date Taking? Authorizing Provider  albuterol  (VENTOLIN  HFA) 108 (90 Base) MCG/ACT inhaler Inhale 2 puffs into the lungs every 6 (six) hours as needed for shortness of breath or wheezing. Patient not taking: Reported on 04/10/2023 03/14/23 03/13/24  [provider]  Continuous Glucose Sensor (DEXCOM G7 SENSOR) MISC Inject 1 Application into the skin as directed. 10/30/22   Diona Perkins, MD  escitalopram  (LEXAPRO ) 20 MG tablet Take 20 mg by mouth daily. 10/04/22 04/10/23  [provider]  ferrous sulfate  324 MG TBEC Take 1 tablet (324 mg total) by mouth every other day. 11/02/22   Gomes, Adriana, DO  fluticasone  (FLONASE ) 50 MCG/ACT nasal spray Place 2 sprays into both nostrils daily. 02/17/23   Vivienne Delon HERO, PA-C  folic acid (FOLVITE) 1 MG tablet Take 1 mg by mouth daily. 04/07/23 04/06/24  [provider]  Glucagon  (BAQSIMI  TWO PACK) 3 MG/DOSE POWD Place 3 mg into the nose as needed (low blood sugar).    [provider]  ibuprofen  (ADVIL ) 800 MG tablet Take 800 mg by mouth every 6 (six) hours as needed for mild pain (pain score 1-3), headache or moderate pain (pain score 4-6). 12/11/22   [provider]  insulin  aspart (NOVOLOG ) 100 UNIT/ML  FlexPen Inject 4 Units into the skin 3 (three) times daily with meals. Until 4/9 when you start your insulin  pump; Do not inject if your BG is < 80 mg/dL 05/03/72   Renne Homans, MD  insulin  degludec ([name] ) 100 UNIT/ML FlexTouch Pen Inject 30 Units into the skin daily. Per your Endocrinologist, increase by 2 units every 3 days until fasting BG is 200mg /dL or less Patient taking differently: Inject 22 Units into the skin daily. 12/26/22   Cindy Garnette POUR, MD  Insulin  Disposable Pump (OMNIPOD 5 DEXG7G6 INTRO GEN 5) KIT Use as prescribed Patient taking differently: Inject 1 Application into the skin See admin instructions. Change site every three days 01/27/23   Dameron, Marisa, DO  insulin  lispro (HUMALOG ) 100 UNIT/ML injection Inject 200 Units into the skin See admin instructions. Insert 200 units into omnipod. Change omnipod every three days 03/10/23   [provider]  Insulin  Pen Needle 32G X 4 MM MISC Use as directed with insulin . 04/11/23   Amoako, Prince, MD  melatonin 3 MG TABS tablet Take 6 mg by mouth at bedtime. 04/06/23   [provider]  ondansetron  (ZOFRAN -ODT) 4 MG disintegrating tablet Take 1 tablet (4 mg total) by mouth every 8 (eight) hours as needed. Patient taking differently: Take 4 mg by mouth every 8 (eight) hours as needed for refractory nausea / vomiting, vomiting or nausea. 02/17/23   Vivienne Delon HERO, PA-C  pantoprazole  (PROTONIX ) 40 MG tablet Take 40 mg  by mouth daily. 03/17/23 03/16/24  [provider]  SUMAtriptan  (IMITREX ) 25 MG tablet Take 0.5 tablets (12.5 mg total) by mouth every 2 (two) hours as needed for migraine. May repeat in 2 hours if headache persists or recurs. 04/12/21   Hope Merle, MD  traMADol  (ULTRAM ) 50 MG tablet Take 1 tablet (50 mg total) by mouth every 6 (six) hours as needed for severe pain (pain score 7-10) or moderate pain (pain score 4-6). Patient not taking: Reported on 04/10/2023 01/08/23   Sheldon Standing, MD  Vitamin D ,  Ergocalciferol , (DRISDOL ) 1.25 MG (50000 UNIT) CAPS capsule Take 50,000 Units by mouth every 7 (seven) days. 05/24/22   [provider]    Allergies: Latex, Tape, and Shellfish allergy    Review of Systems  Unable to perform ROS: Acuity of condition  Constitutional:  Negative for fever.  Respiratory:  Negative for wheezing and stridor.   Gastrointestinal:  Positive for vomiting. Negative for diarrhea.    Updated Vital Signs BP (!) 158/110   Pulse (!) 106   Temp 98.1 F (36.7 C) (Oral)   Resp 19   Ht 5' 2 (1.575 m)   Wt 49.2 kg   SpO2 100%   BMI 19.84 kg/m   Physical Exam Vitals and nursing note reviewed.  Constitutional:      General: She is not in acute distress.    Appearance: She is well-developed. She is ill-appearing.  HENT:     Head: Normocephalic and atraumatic.     Nose: Nose normal.  Eyes:     Pupils: Pupils are equal, round, and reactive to light.  Cardiovascular:     Rate and Rhythm: Regular rhythm. Tachycardia present.     Pulses: Normal pulses.     Heart sounds: Normal heart sounds.  Pulmonary:     Effort: Pulmonary effort is normal. No respiratory distress.     Breath sounds: Normal breath sounds.  Abdominal:     General: Bowel sounds are normal. There is no distension.     Palpations: Abdomen is soft.     Tenderness: There is no abdominal tenderness. There is no guarding or rebound.  Musculoskeletal:        General: Normal range of motion.     Cervical back: Neck supple.  Skin:    General: Skin is warm and dry.     Capillary Refill: Capillary refill takes less than 2 seconds.     Findings: No erythema or rash.  Neurological:     Deep Tendon Reflexes: Reflexes normal.     (all labs ordered are listed, but only abnormal results are displayed) Results for orders placed or performed during the hospital encounter of 07/18/23  CBG monitoring, ED   Collection Time: 07/18/23 11:13 PM  Result Value Ref Range   Glucose-Capillary 191 (H) 70 -  99 mg/dL  hCG, serum, qualitative   Collection Time: 07/18/23 11:34 PM  Result Value Ref Range   Preg, Serum NEGATIVE NEGATIVE  CBC with Differential   Collection Time: 07/18/23 11:34 PM  Result Value Ref Range   WBC 13.3 (H) 4.0 - 10.5 K/uL   RBC 4.36 3.87 - 5.11 MIL/uL   Hemoglobin 11.0 (L) 12.0 - 15.0 g/dL   HCT 64.6 (L) 63.9 - 53.9 %   MCV 81.0 80.0 - 100.0 fL   MCH 25.2 (L) 26.0 - 34.0 pg   MCHC 31.2 30.0 - 36.0 g/dL   RDW 82.3 (H) 88.4 - 84.4 %   Platelets 405 (H) 150 -  400 K/uL   nRBC 0.0 0.0 - 0.2 %   Neutrophils Relative % 63 %   Neutro Abs 8.3 (H) 1.7 - 7.7 K/uL   Lymphocytes Relative 30 %   Lymphs Abs 4.1 (H) 0.7 - 4.0 K/uL   Monocytes Relative 7 %   Monocytes Absolute 0.9 0.1 - 1.0 K/uL   Eosinophils Relative 0 %   Eosinophils Absolute 0.0 0.0 - 0.5 K/uL   Basophils Relative 0 %   Basophils Absolute 0.1 0.0 - 0.1 K/uL   Immature Granulocytes 0 %   Abs Immature Granulocytes 0.06 0.00 - 0.07 K/uL  Comprehensive metabolic panel   Collection Time: 07/18/23 11:34 PM  Result Value Ref Range   Sodium 138 135 - 145 mmol/L   Potassium 3.7 3.5 - 5.1 mmol/L   Chloride 105 98 - 111 mmol/L   CO2 13 (L) 22 - 32 mmol/L   Glucose, Bld 196 (H) 70 - 99 mg/dL   BUN 34 (H) 6 - 20 mg/dL   Creatinine, Ser 8.78 (H) 0.44 - 1.00 mg/dL   Calcium  9.7 8.9 - 10.3 mg/dL   Total Protein 8.9 (H) 6.5 - 8.1 g/dL   Albumin 4.1 3.5 - 5.0 g/dL   AST 26 15 - 41 U/L   ALT 21 0 - 44 U/L   Alkaline Phosphatase 165 (H) 38 - 126 U/L   Total Bilirubin 1.2 0.0 - 1.2 mg/dL   GFR, Estimated >39 >39 mL/min   Anion gap 20 (H) 5 - 15  I-stat chem 8, ed   Collection Time: 07/18/23 11:35 PM  Result Value Ref Range   Sodium 139 135 - 145 mmol/L   Potassium 3.7 3.5 - 5.1 mmol/L   Chloride 108 98 - 111 mmol/L   BUN 33 (H) 6 - 20 mg/dL   Creatinine, Ser 9.19 0.44 - 1.00 mg/dL   Glucose, Bld 798 (H) 70 - 99 mg/dL   Calcium , Ion 1.18 1.15 - 1.40 mmol/L   TCO2 13 (L) 22 - 32 mmol/L   Hemoglobin 12.6  12.0 - 15.0 g/dL   HCT 62.9 63.9 - 53.9 %  CBG monitoring, ED   Collection Time: 07/19/23  1:16 AM  Result Value Ref Range   Glucose-Capillary 156 (H) 70 - 99 mg/dL  I-Stat Venous Blood Gas, ED   Collection Time: 07/19/23  1:16 AM  Result Value Ref Range   pH, Ven 7.326 7.25 - 7.43   pCO2, Ven 35.0 (L) 44 - 60 mmHg   pO2, Ven 32 32 - 45 mmHg   Bicarbonate 18.3 (L) 20.0 - 28.0 mmol/L   TCO2 19 (L) 22 - 32 mmol/L   O2 Saturation 58 %   Acid-base deficit 7.0 (H) 0.0 - 2.0 mmol/L   Sodium 139 135 - 145 mmol/L   Potassium 4.2 3.5 - 5.1 mmol/L   Calcium , Ion 1.19 1.15 - 1.40 mmol/L   HCT 39.0 36.0 - 46.0 %   Hemoglobin 13.3 12.0 - 15.0 g/dL   Sample type VENOUS    Comment NOTIFIED PHYSICIAN   hCG, serum, qualitative   Collection Time: 07/19/23  1:38 AM  Result Value Ref Range   Preg, Serum NEGATIVE NEGATIVE  Basic metabolic panel   Collection Time: 07/19/23  1:38 AM  Result Value Ref Range   Sodium 137 135 - 145 mmol/L   Potassium 3.8 3.5 - 5.1 mmol/L   Chloride 103 98 - 111 mmol/L   CO2 16 (L) 22 - 32 mmol/L   Glucose, Bld 166 (  H) 70 - 99 mg/dL   BUN 33 (H) 6 - 20 mg/dL   Creatinine, Ser 8.78 (H) 0.44 - 1.00 mg/dL   Calcium  9.8 8.9 - 10.3 mg/dL   GFR, Estimated >39 >39 mL/min   Anion gap 18 (H) 5 - 15  Beta-hydroxybutyric acid   Collection Time: 07/19/23  1:39 AM  Result Value Ref Range   Beta-Hydroxybutyric Acid 4.48 (H) 0.05 - 0.27 mmol/L  CBG monitoring, ED   Collection Time: 07/19/23  2:27 AM  Result Value Ref Range   Glucose-Capillary 178 (H) 70 - 99 mg/dL   *Note: Due to a large number of results and/or encounters for the requested time period, some results have not been displayed. A complete set of results can be found in Results Review.   DG Chest 1 View Result Date: 07/19/2023 CLINICAL DATA:  Emesis EXAM: PORTABLE CHEST 1 VIEW COMPARISON:  01/02/2023 FINDINGS: The heart size and mediastinal contours are within normal limits. Both lungs are clear. The visualized  skeletal structures are unremarkable. IMPRESSION: No active disease. Electronically Signed   By: Oneil Devonshire M.D.   On: 07/19/2023 02:44     EKG: EKG Interpretation Date/Time:  Wednesday July 19 2023 01:06:21 EDT Ventricular Rate:  109 PR Interval:  124 QRS Duration:  80 QT Interval:  343 QTC Calculation: 462 R Axis:   56  Text Interpretation: Sinus tachycardia Confirmed by Clarnce Homan ([id number]) on 07/19/2023 1:19:15 AM  Radiology: No results found.   .Critical Care  Performed by: Nettie Earing, MD Authorized by: Nettie Earing, MD   Critical care provider statement:    Critical care time (minutes):  30   Critical care end time:  07/19/2023 2:55 AM   Critical care was necessary to treat or prevent imminent or life-threatening deterioration of the following conditions:  Endocrine crisis   Critical care was time spent personally by me on the following activities:  Development of treatment plan with patient or surrogate, discussions with consultants, evaluation of patient's response to treatment, examination of patient, ordering and review of laboratory studies, ordering and review of radiographic studies, ordering and performing treatments and interventions, pulse oximetry, re-evaluation of patient's condition and review of old charts   Care discussed with: admitting provider      Medications Ordered in the ED  insulin  regular, human (MYXREDLIN ) 100 units/ 100 mL infusion (2 Units/hr Intravenous Rate/Dose Change 07/19/23 0229)  lactated ringers  infusion ( Intravenous Not Given 07/19/23 0120)  dextrose  5 % in lactated ringers  infusion ( Intravenous New Bag/Given 07/19/23 0120)  dextrose  50 % solution 0-50 mL (has no administration in time range)  docusate sodium  (COLACE) capsule 100 mg (has no administration in time range)  polyethylene glycol (MIRALAX  / GLYCOLAX ) packet 17 g (has no administration in time range)  enoxaparin  (LOVENOX ) injection 30 mg (has no administration in time  range)  hydrALAZINE  (APRESOLINE ) injection 10 mg (has no administration in time range)  ondansetron  (ZOFRAN ) injection 4 mg (has no administration in time range)  famotidine  (PEPCID ) IVPB 20 mg premix (has no administration in time range)  iron  sucrose (VENOFER ) 200 mg in sodium chloride  0.9 % 100 mL IVPB (has no administration in time range)  Vitamin D  (Ergocalciferol ) (DRISDOL ) 1.25 MG (50000 UNIT) capsule 50,000 Units (has no administration in time range)  lactated ringers  bolus 984 mL (0 mLs Intravenous Stopped 07/19/23 0201)  ondansetron  (ZOFRAN ) injection 4 mg (4 mg Intravenous Given 07/19/23 0140)  Medical Decision Making AMS and vomiting in a type 1 diabetic  Amount and/or Complexity of Data Reviewed Independent Historian:     Details: Spoke to aunt and friend External Data Reviewed: labs and notes.    Details: Previous notes and labs reviewed  Labs: ordered.    Details: Pregnancy is negative.  Elevated beta hydroxy 4.48.  normal sodium 138, normal potassium 3.7,  ECG/medicine tests: ordered and independent interpretation performed. Decision-making details documented in ED Course.  Risk Prescription drug management. Decision regarding hospitalization.     Final diagnoses:  Nausea and vomiting, unspecified vomiting type  Type 1 diabetes mellitus with ketoacidosis without coma (HCC)   The patient appears reasonably stabilized for admission considering the current resources, flow, and capabilities available in the ED at this time, and I doubt any other [facility] requiring further screening and/or treatment in the ED prior to admission.  ED Discharge Orders     None          Payzlee Ryder, MD 07/19/23 9676

## 2023-07-20 DIAGNOSIS — E1010 Type 1 diabetes mellitus with ketoacidosis without coma: Secondary | ICD-10-CM | POA: Diagnosis not present

## 2023-07-20 LAB — BASIC METABOLIC PANEL WITH GFR
Anion gap: 12 (ref 5–15)
BUN: 9 mg/dL (ref 6–20)
CO2: 23 mmol/L (ref 22–32)
Calcium: 9 mg/dL (ref 8.9–10.3)
Chloride: 104 mmol/L (ref 98–111)
Creatinine, Ser: 0.84 mg/dL (ref 0.44–1.00)
GFR, Estimated: >60 mL/min (ref >60)
Glucose, Bld: 79 mg/dL (ref 70–99)
Potassium: 3 mmol/L — ABNORMAL LOW (ref 3.5–5.1)
Sodium: 139 mmol/L (ref 135–145)

## 2023-07-20 LAB — CBC
HCT: 30.5 % — ABNORMAL LOW (ref 36.0–46.0)
Hemoglobin: 9.5 g/dL — ABNORMAL LOW (ref 12.0–15.0)
MCH: 25.1 pg — ABNORMAL LOW (ref 26.0–34.0)
MCHC: 31.1 g/dL (ref 30.0–36.0)
MCV: 80.7 fL (ref 80.0–100.0)
Platelets: 316 K/uL (ref 150–400)
RBC: 3.78 MIL/uL — ABNORMAL LOW (ref 3.87–5.11)
RDW: 18.3 % — ABNORMAL HIGH (ref 11.5–15.5)
WBC: 12.3 K/uL — ABNORMAL HIGH (ref 4.0–10.5)
nRBC: 0 % (ref 0.0–0.2)

## 2023-07-20 LAB — GLUCOSE, CAPILLARY
Glucose-Capillary: 65 mg/dL — ABNORMAL LOW (ref 70–99)
Glucose-Capillary: 78 mg/dL (ref 70–99)
Glucose-Capillary: 105 mg/dL — ABNORMAL HIGH (ref 70–99)
Glucose-Capillary: 206 mg/dL — ABNORMAL HIGH (ref 70–99)

## 2023-07-20 MED ORDER — POTASSIUM CHLORIDE CRYS ER 20 MEQ PO TBCR
40.0000 meq | EXTENDED_RELEASE_TABLET | Freq: Once | ORAL | Status: AC
  Administered 2023-07-20: 40 meq via ORAL
  Filled 2023-07-20: qty 2

## 2023-07-20 MED ORDER — POTASSIUM CHLORIDE CRYS ER 20 MEQ PO TBCR
40.0000 meq | EXTENDED_RELEASE_TABLET | ORAL | Status: DC
  Administered 2023-07-20: 40 meq via ORAL
  Filled 2023-07-20: qty 2

## 2023-07-20 MED ORDER — TRAMADOL HCL 50 MG PO TABS
50.0000 mg | ORAL_TABLET | Freq: Four times a day (QID) | ORAL | Status: DC | PRN
  Administered 2023-07-20: 50 mg via ORAL
  Filled 2023-07-20: qty 1

## 2023-07-20 NOTE — Assessment & Plan Note (Signed)
 Pt given po kcl prior to discharge.

## 2023-07-20 NOTE — Discharge Instructions (Signed)
 Discharge instructions 1. Follow up with your primary care provider in 1-2 weeks following discharge from hospital. 2. Restart your insulin  pump as soon as you get home.

## 2023-07-20 NOTE — Progress Notes (Signed)
 Pt able to tolerate first meal and education provided by MD to restart Insulin  pump once getting home. Vitals are stable and labs reviewed by MD and okay per MD to d/c home from ICU

## 2023-07-20 NOTE — Hospital Course (Signed)
 HPI: A 23 yr old female patient with IDDM (insulin  pump, poorly controlled, last HbA1c 11.4% April 2025), Fe def anemia, and vitamin D  deficiency with multiple admissions due to DKA who presents to ED with N/V and and hot flashes x1 day. She has been unable to check sugars at home.  Started on DKA protocol in ED. Hx is limited due to lethargy.   Significant Events: Admitted 07/18/2023 for DKA type 1   Admission Labs: WBC 13.3, HgB 11, plt 405 Na 138, K 3.7, CO2 of 13, BUN 34, Scr 1.21, glu 196 VBG pH 7.326, PCO2 of 35 Beta hydroxybutyric acid 4.48  Admission Imaging Studies: CXR no active disease  Significant Labs:   Significant Imaging Studies:   Antibiotic Therapy: Anti-infectives (From admission, onward)    None       Procedures:   Consultants: PCCM

## 2023-07-20 NOTE — Discharge Summary (Addendum)
 [facility]  Hospital[facility] Physician Discharge Summary   Patient name: Dawn [name]  Admit date:     07/18/2023  Discharge date: 07/20/2023  Attending Physician: DUB MANCEL HERO [[id number]]  Discharge Physician: Camellia Door   PCP: Pcp, No  Admitted From: Home  Disposition:  Home  Recommendations for Outpatient Follow-up:  Follow up with PCP in 1-2 weeks Follow up with endocrinology(Goukassian) at [facility]:No Equipment/Devices: None    Discharge Condition:Stable CODE STATUS:FULL Diet recommendation: Diabetic Fluid Restriction: None  Hospital Summary: HPI: A 23 yr old female patient with IDDM (insulin  pump, poorly controlled, last HbA1c 11.4% April 2025), Fe def anemia, and vitamin D  deficiency with multiple admissions due to DKA who presents to ED with N/V and and hot flashes x1 day. She has been unable to check sugars at home.  Started on DKA protocol in ED. Hx is limited due to lethargy.   Significant Events: Admitted 07/18/2023 for DKA type 1   Admission Labs: WBC 13.3, HgB 11, plt 405 Na 138, K 3.7, CO2 of 13, BUN 34, Scr 1.21, glu 196 VBG pH 7.326, PCO2 of 35 Beta hydroxybutyric acid 4.48  Admission Imaging Studies: CXR no active disease  Significant Labs:   Significant Imaging Studies:   Antibiotic Therapy: Anti-infectives (From admission, onward)    None       Procedures:   Consultants: [facility] Course by Problem: * DKA, type 1 (HCC) 07-20-2023 pt admitted to ICU under PCCM service. Placed on routine DKA protocol with IV insulin . DKA resolved. AG closed. Pt transitioned to SQ insulin . Pt on omnipod at home. Has Dexcom CGB. Able to tolerate solid food. Ready for DC. Pt instructed to restart her insulin  pump as soon as she gets home.  Hypokalemia Pt given po kcl prior to discharge.  Uncontrolled type 1 diabetes mellitus with hypoglycemia, with long-term current use of insulin  (HCC) Had hypoglycemia while on insulin   gtts. Needed D5LR to compensate.    Discharge Diagnoses:  Principal Problem:   DKA, type 1 (HCC) Active Problems:   Hypokalemia   Uncontrolled type 1 diabetes mellitus with hypoglycemia, with long-term current use of insulin  [facility])   Discharge Instructions  Discharge Instructions     Call MD for:  difficulty breathing, headache or visual disturbances   Complete by: As directed    Call MD for:  extreme fatigue   Complete by: As directed    Call MD for:  hives   Complete by: As directed    Call MD for:  persistant dizziness or light-headedness   Complete by: As directed    Call MD for:  persistant nausea and vomiting   Complete by: As directed    Call MD for:  redness, tenderness, or signs of infection (pain, swelling, redness, odor or green/yellow discharge around incision site)   Complete by: As directed    Call MD for:  severe uncontrolled pain   Complete by: As directed    Call MD for:  temperature >100.4   Complete by: As directed    Diet Carb Modified   Complete by: As directed    Discharge instructions   Complete by: As directed    1. Follow up with your primary care provider in 1-2 weeks following discharge from hospital. 2. Restart your insulin  pump as soon as you get home.   Increase activity slowly   Complete by: As directed       Allergies as of 07/20/2023  Reactions   Latex Itching, Swelling, Other (See Comments)   Skin irritation   Tape Other (See Comments)   Skin irritation   Shellfish Allergy Itching, Rash   Scallops specifically        Medication List     STOP taking these medications    albuterol  108 (90 Base) MCG/ACT inhaler Commonly known as: VENTOLIN  HFA   ferrous sulfate  324 MG Tbec   ibuprofen  800 MG tablet Commonly known as: ADVIL    insulin  degludec 100 UNIT/ML FlexTouch Pen Commonly known as: [name]    NovoLOG  FlexPen 100 UNIT/ML FlexPen Generic drug: insulin  aspart   traMADol  50 MG tablet Commonly known as:  ULTRAM    Vitamin D  (Ergocalciferol ) 1.25 MG (50000 UNIT) Caps capsule Commonly known as: DRISDOL        TAKE these medications    Baqsimi  Two Pack 3 MG/DOSE Powd Generic drug: Glucagon  Place 3 mg into the nose as needed (low blood sugar).   Dexcom G7 Sensor Misc Inject 1 Application into the skin as directed.   escitalopram  20 MG tablet Commonly known as: LEXAPRO  Take 20 mg by mouth daily.   insulin  lispro 100 UNIT/ML injection Commonly known as: HUMALOG  Inject 200 Units into the skin See admin instructions. Insert 200 units into omnipod. Change omnipod every three days   Omnipod 5 DexG7G6 Intro Gen 5 Kit Use as prescribed What changed:  how much to take how to take this when to take this additional instructions   TechLite Plus Pen Needles 32G X 4 MM Misc Generic drug: Insulin  Pen Needle Use as directed with insulin .        Follow-up Information     Goukassian, Ilona, DO. Schedule an appointment as soon as possible for a visit in 1 week(s).   Specialty: Endocrinology Contact information: [address] [place] [address] [phone]                Allergies  Allergen Reactions   Latex Itching, Swelling and Other (See Comments)    Skin irritation   Tape Other (See Comments)    Skin irritation   Shellfish Allergy Itching and Rash    Scallops specifically    Discharge Exam: Vitals:   07/20/23 1000 07/20/23 1100  BP:    Pulse: 91 93  Resp: 17 12  Temp:    SpO2: 99% 99%    Physical Exam Vitals and nursing note reviewed.  Constitutional:      General: She is not in acute distress.    Appearance: She is normal weight. She is not toxic-appearing or diaphoretic.  HENT:     Head: Normocephalic and atraumatic.     Nose: Nose normal.  Cardiovascular:     Rate and Rhythm: Normal rate and regular rhythm.  Pulmonary:     Effort: Pulmonary effort is normal.     Breath sounds: Normal breath sounds.  Abdominal:     General:  Bowel sounds are normal. There is no distension.     Palpations: Abdomen is soft.  Skin:    General: Skin is warm and dry.     Capillary Refill: Capillary refill takes less than 2 seconds.  Neurological:     Mental Status: She is alert and oriented to person, place, and time.     The results of significant diagnostics from this hospitalization (including imaging, microbiology, ancillary and laboratory) are listed below for reference.    Microbiology: Recent Results (from the past 240 hours)  MRSA Next Gen by PCR,  Nasal     Status: Abnormal   Collection Time: 07/19/23  2:14 AM   Specimen: Urine, Catheterized; Nasal Swab  Result Value Ref Range Status   MRSA by PCR Next Gen DETECTED (A) NOT DETECTED Final    Comment: RESULTS CALLED TO READ BACK BY AND VERIFIED WITH RN E.SKARRING ON 07/19/23 AT 0506 BY NM (NOTE) The GeneXpert MRSA Assay (FDA approved for NASAL specimens only), is one component of a comprehensive MRSA colonization surveillance program. It is not intended to diagnose MRSA infection nor to guide or monitor treatment for MRSA infections. Test performance is not FDA approved in patients less than 34 years old. Performed at [facility], [address], [place], [place] [address]      Labs: Basic Metabolic Panel: Recent Labs  Lab 07/19/23 0539 07/19/23 0902 07/19/23 1211 07/19/23 1709 07/20/23 0515  NA 139 137 138 141 139  K 3.8 3.7 4.1 4.0 3.0*  CL 103 104 104 107 104  CO2 16* 17* 20* 23 23  GLUCOSE 184* 205* 177* 93 79  BUN 26* 22* 20 15 9   CREATININE 0.89 0.80 0.87 0.81 0.84  CALCIUM  9.6 9.3 9.4 9.8 9.0  MG 2.1  --   --   --   --   PHOS 3.5  --   --   --   --    Liver Function Tests: Recent Labs  Lab 07/18/23 2334  AST 26  ALT 21  ALKPHOS 165*  BILITOT 1.2  PROT 8.9*  ALBUMIN 4.1   CBC: Recent Labs  Lab 07/18/23 2334 07/18/23 2335 07/19/23 0116 07/20/23 0515  WBC 13.3*  --   --  12.3*  NEUTROABS 8.3*  --   --   --   HGB 11.0* 12.6  13.3 9.5*  HCT 35.3* 37.0 39.0 30.5*  MCV 81.0  --   --  80.7  PLT 405*  --   --  316   CBG: Recent Labs  Lab 07/19/23 1919 07/20/23 0350 07/20/23 0425 07/20/23 0748 07/20/23 1142  GLUCAP 137* 65* 78 105* 206*   Hgb A1c Recent Labs    07/19/23 0539  HGBA1C 11.7*   Sepsis Labs Recent Labs  Lab 07/18/23 2334 07/20/23 0515  WBC 13.3* 12.3*    Procedures/Studies: DG Chest 1 View Result Date: 07/19/2023 CLINICAL DATA:  Emesis EXAM: PORTABLE CHEST 1 VIEW COMPARISON:  01/02/2023 FINDINGS: The heart size and mediastinal contours are within normal limits. Both lungs are clear. The visualized skeletal structures are unremarkable. IMPRESSION: No active disease. Electronically Signed   By: Oneil Devonshire M.D.   On: 07/19/2023 02:44    Time coordinating discharge: 55 mins  SIGNED:  Camellia Door, DO [facility] 07/20/23, 1:11 PM

## 2023-07-20 NOTE — Assessment & Plan Note (Signed)
 Had hypoglycemia while on insulin  gtts. Needed D5LR to compensate.

## 2023-07-20 NOTE — Subjective & Objective (Signed)
 Pt seen and examined. Boy friend at bedside. Pt transitioned to SQ insulin . Pt on omnipod at home. Has endocrinologist at [facility]. Pt able to tolerate solid food without N/V. Ready to go home.

## 2023-07-20 NOTE — Progress Notes (Signed)
 PROGRESS NOTE    Dawn [name]  FMW:[id number] DOB: [birth year] DOA: 07/18/2023 PCP: Pcp, No  Subjective: Pt seen and examined. Boy friend at bedside. Pt transitioned to SQ insulin . Pt on omnipod at home. Has endocrinologist at [facility]. Pt able to tolerate solid food without N/V. Ready to go home.   Hospital Course: HPI: A 23 yr old female patient with IDDM (insulin  pump, poorly controlled, last HbA1c 11.4% April 2025), Fe def anemia, and vitamin D  deficiency with multiple admissions due to DKA who presents to ED with N/V and and hot flashes x1 day. She has been unable to check sugars at home.  Started on DKA protocol in ED. Hx is limited due to lethargy.   Significant Events: Admitted 07/18/2023 for DKA type 1   Admission Labs: WBC 13.3, HgB 11, plt 405 Na 138, K 3.7, CO2 of 13, BUN 34, Scr 1.21, glu 196 VBG pH 7.326, PCO2 of 35 Beta hydroxybutyric acid 4.48  Admission Imaging Studies: CXR no active disease  Significant Labs:   Significant Imaging Studies:   Antibiotic Therapy: Anti-infectives (From admission, onward)    None       Procedures:   Consultants: PCCM    Assessment and Plan: * DKA, type 1 (HCC) 07-20-2023 pt admitted to ICU under PCCM service. Placed on routine DKA protocol with IV insulin . DKA resolved. AG closed. Pt transitioned to SQ insulin . Pt on omnipod at home. Has Dexcom CGB. Able to tolerate solid food. Ready for DC. Pt instructed to restart her insulin  pump as soon as she gets home.   DVT prophylaxis: enoxaparin  (LOVENOX ) injection 30 mg Start: 07/19/23 1000 SCDs Start: 07/19/23 0214     Code Status: Full Code Family Communication: discussed with pt and boyfriend at bedside Disposition Plan: return home Reason for continuing need for hospitalization: stable for DC.  Objective: Vitals:   07/20/23 0800 07/20/23 0900 07/20/23 1000 07/20/23 1100  BP:      Pulse: 87 98 91 93  Resp: 17 13 17 12   Temp:      TempSrc:      SpO2: 100%  100% 99% 99%  Weight:      Height:        Intake/Output Summary (Last 24 hours) at 07/20/2023 1124 Last data filed at 07/20/2023 9361 Gross per 24 hour  Intake 1795.71 ml  Output 300 ml  Net 1495.71 ml   Filed Weights   07/18/23 2312  Weight: 49.2 kg    Examination:  Physical Exam Vitals and nursing note reviewed.  Constitutional:      General: She is not in acute distress.    Appearance: She is normal weight. She is not toxic-appearing or diaphoretic.  HENT:     Head: Normocephalic and atraumatic.     Nose: Nose normal.  Cardiovascular:     Rate and Rhythm: Normal rate and regular rhythm.  Pulmonary:     Effort: Pulmonary effort is normal.     Breath sounds: Normal breath sounds.  Abdominal:     General: Bowel sounds are normal. There is no distension.     Palpations: Abdomen is soft.  Skin:    General: Skin is warm and dry.     Capillary Refill: Capillary refill takes less than 2 seconds.  Neurological:     Mental Status: She is alert and oriented to person, place, and time.     Data Reviewed: I have personally reviewed following labs and imaging studies  CBC: Recent Labs  Lab 07/18/23 2334  07/18/23 2335 07/19/23 0116 07/20/23 0515  WBC 13.3*  --   --  12.3*  NEUTROABS 8.3*  --   --   --   HGB 11.0* 12.6 13.3 9.5*  HCT 35.3* 37.0 39.0 30.5*  MCV 81.0  --   --  80.7  PLT 405*  --   --  316   Basic Metabolic Panel: Recent Labs  Lab 07/19/23 0539 07/19/23 0902 07/19/23 1211 07/19/23 1709 07/20/23 0515  NA 139 137 138 141 139  K 3.8 3.7 4.1 4.0 3.0*  CL 103 104 104 107 104  CO2 16* 17* 20* 23 23  GLUCOSE 184* 205* 177* 93 79  BUN 26* 22* 20 15 9   CREATININE 0.89 0.80 0.87 0.81 0.84  CALCIUM  9.6 9.3 9.4 9.8 9.0  MG 2.1  --   --   --   --   PHOS 3.5  --   --   --   --    GFR: Estimated Creatinine Clearance: 81.6 mL/min (by C-G formula based on SCr of 0.84 mg/dL). Liver Function Tests: Recent Labs  Lab 07/18/23 2334  AST 26  ALT 21   ALKPHOS 165*  BILITOT 1.2  PROT 8.9*  ALBUMIN 4.1   HbA1C: Recent Labs    07/19/23 0539  HGBA1C 11.7*   CBG: Recent Labs  Lab 07/19/23 1858 07/19/23 1919 07/20/23 0350 07/20/23 0425 07/20/23 0748  GLUCAP 143* 137* 65* 78 105*    Recent Results (from the past 240 hours)  MRSA Next Gen by PCR, Nasal     Status: Abnormal   Collection Time: 07/19/23  2:14 AM   Specimen: Urine, Catheterized; Nasal Swab  Result Value Ref Range Status   MRSA by PCR Next Gen DETECTED (A) NOT DETECTED Final    Comment: RESULTS CALLED TO READ BACK BY AND VERIFIED WITH RN E.SKARRING ON 07/19/23 AT 0506 BY NM (NOTE) The GeneXpert MRSA Assay (FDA approved for NASAL specimens only), is one component of a comprehensive MRSA colonization surveillance program. It is not intended to diagnose MRSA infection nor to guide or monitor treatment for MRSA infections. Test performance is not FDA approved in patients less than 72 years old. Performed at [facility], [address], [place], [place] [address]      Radiology Studies: DG Chest 1 View Result Date: 07/19/2023 CLINICAL DATA:  Emesis EXAM: PORTABLE CHEST 1 VIEW COMPARISON:  01/02/2023 FINDINGS: The heart size and mediastinal contours are within normal limits. Both lungs are clear. The visualized skeletal structures are unremarkable. IMPRESSION: No active disease. Electronically Signed   By: Oneil Devonshire M.D.   On: 07/19/2023 02:44    Scheduled Meds:  Chlorhexidine  Gluconate Cloth  6 each Topical Daily   enoxaparin  (LOVENOX ) injection  30 mg Subcutaneous Q24H   insulin  aspart  0-5 Units Subcutaneous QHS   insulin  aspart  0-9 Units Subcutaneous TID WC   insulin  aspart  3 Units Subcutaneous TID WC   insulin  glargine-yfgn  20 Units Subcutaneous QHS   mupirocin  ointment  1 Application Nasal BID   Vitamin D  (Ergocalciferol )  50,000 Units Oral Q Wed   Continuous Infusions:  famotidine  (PEPCID ) IV 20 mg (07/20/23 1113)   promethazine   (PHENERGAN ) injection (IM or IVPB) Stopped (07/19/23 1041)     LOS: 1 day   Time spent: 55 minutes  Camellia Door, DO  [facility]  07/20/2023, 11:24 AM

## 2023-07-20 NOTE — Assessment & Plan Note (Signed)
 07-20-2023 pt admitted to ICU under PCCM service. Placed on routine DKA protocol with IV insulin . DKA resolved. AG closed. Pt transitioned to SQ insulin . Pt on omnipod at home. Has Dexcom CGB. Able to tolerate solid food. Ready for DC. Pt instructed to restart her insulin  pump as soon as she gets home.

## 2023-07-27 NOTE — Telephone Encounter (Signed)
 Tried calling pt again still states call cannot be completed at this time.

## 2023-07-28 ENCOUNTER — Emergency Department (HOSPITAL_COMMUNITY)
Admission: EM | Admit: 2023-07-28 | Discharge: 2023-07-28 | Disposition: A | Discharge status: Home / Self Care | Payer: MEDICAID | Attending: Emergency Medicine | Admitting: Emergency Medicine

## 2023-07-28 ENCOUNTER — Other Ambulatory Visit: Payer: Self-pay | Admitting: Critical Care Medicine

## 2023-07-28 ENCOUNTER — Encounter (HOSPITAL_COMMUNITY): Payer: Self-pay

## 2023-07-28 DIAGNOSIS — Y900 Blood alcohol level of less than 20 mg/100 ml: Secondary | ICD-10-CM | POA: Insufficient documentation

## 2023-07-28 DIAGNOSIS — E10649 Type 1 diabetes mellitus with hypoglycemia without coma: Secondary | ICD-10-CM | POA: Insufficient documentation

## 2023-07-28 DIAGNOSIS — R4182 Altered mental status, unspecified: Secondary | ICD-10-CM | POA: Diagnosis not present

## 2023-07-28 DIAGNOSIS — Z91040 Latex allergy status: Secondary | ICD-10-CM | POA: Diagnosis not present

## 2023-07-28 DIAGNOSIS — F1210 Cannabis abuse, uncomplicated: Secondary | ICD-10-CM | POA: Insufficient documentation

## 2023-07-28 DIAGNOSIS — E162 Hypoglycemia, unspecified: Secondary | ICD-10-CM

## 2023-07-28 LAB — CBC WITH DIFFERENTIAL/PLATELET
Abs Immature Granulocytes: 0.03 K/uL (ref 0.00–0.07)
Basophils Absolute: 0 K/uL (ref 0.0–0.1)
Basophils Relative: 0 %
Eosinophils Absolute: 0 K/uL (ref 0.0–0.5)
Eosinophils Relative: 0 %
HCT: 33.2 % — ABNORMAL LOW (ref 36.0–46.0)
Hemoglobin: 9.9 g/dL — ABNORMAL LOW (ref 12.0–15.0)
Immature Granulocytes: 0 %
Lymphocytes Relative: 20 %
Lymphs Abs: 1.9 K/uL (ref 0.7–4.0)
MCH: 25 pg — ABNORMAL LOW (ref 26.0–34.0)
MCHC: 29.8 g/dL — ABNORMAL LOW (ref 30.0–36.0)
MCV: 83.8 fL (ref 80.0–100.0)
Monocytes Absolute: 0.7 K/uL (ref 0.1–1.0)
Monocytes Relative: 7 %
Neutro Abs: 6.7 K/uL (ref 1.7–7.7)
Neutrophils Relative %: 73 %
Platelets: 389 K/uL (ref 150–400)
RBC: 3.96 MIL/uL (ref 3.87–5.11)
RDW: 18.7 % — ABNORMAL HIGH (ref 11.5–15.5)
WBC: 9.3 K/uL (ref 4.0–10.5)
nRBC: 0 % (ref 0.0–0.2)

## 2023-07-28 LAB — BLOOD GAS, VENOUS
Acid-base deficit: 0.5 mmol/L (ref 0.0–2.0)
Bicarbonate: 25.8 mmol/L (ref 20.0–28.0)
O2 Saturation: 26.7 %
Patient temperature: 37
pCO2, Ven: 49 mmHg (ref 44–60)
pH, Ven: 7.33 (ref 7.25–7.43)
pO2, Ven: <31 mmHg — CL (ref 32–45)

## 2023-07-28 LAB — RAPID URINE DRUG SCREEN, HOSP PERFORMED
Amphetamines: NOT DETECTED
Barbiturates: NOT DETECTED
Benzodiazepines: NOT DETECTED
Cocaine: NOT DETECTED
Opiates: NOT DETECTED
Tetrahydrocannabinol: POSITIVE — AB

## 2023-07-28 LAB — COMPREHENSIVE METABOLIC PANEL WITH GFR
ALT: 16 U/L (ref 0–44)
AST: 21 U/L (ref 15–41)
Albumin: 3.6 g/dL (ref 3.5–5.0)
Alkaline Phosphatase: 125 U/L (ref 38–126)
Anion gap: 11 (ref 5–15)
BUN: 26 mg/dL — ABNORMAL HIGH (ref 6–20)
CO2: 23 mmol/L (ref 22–32)
Calcium: 9.2 mg/dL (ref 8.9–10.3)
Chloride: 104 mmol/L (ref 98–111)
Creatinine, Ser: 0.66 mg/dL (ref 0.44–1.00)
GFR, Estimated: >60 mL/min
Glucose, Bld: 160 mg/dL — ABNORMAL HIGH (ref 70–99)
Potassium: 4.6 mmol/L (ref 3.5–5.1)
Sodium: 138 mmol/L (ref 135–145)
Total Bilirubin: 0.6 mg/dL (ref 0.0–1.2)
Total Protein: 8 g/dL (ref 6.5–8.1)

## 2023-07-28 LAB — URINALYSIS, W/ REFLEX TO CULTURE (INFECTION SUSPECTED)
Bilirubin Urine: NEGATIVE
Glucose, UA: >=500 mg/dL — AB
Hgb urine dipstick: NEGATIVE
Ketones, ur: 20 mg/dL — AB
Nitrite: NEGATIVE
Protein, ur: 100 mg/dL — AB
Specific Gravity, Urine: 1.023 (ref 1.005–1.030)
pH: 5 (ref 5.0–8.0)

## 2023-07-28 LAB — CBG MONITORING, ED
Glucose-Capillary: 98 mg/dL (ref 70–99)
Glucose-Capillary: 150 mg/dL — ABNORMAL HIGH (ref 70–99)
Glucose-Capillary: 161 mg/dL — ABNORMAL HIGH (ref 70–99)
Glucose-Capillary: 207 mg/dL — ABNORMAL HIGH (ref 70–99)

## 2023-07-28 LAB — ETHANOL: Alcohol, Ethyl (B): <15 mg/dL (ref <15)

## 2023-07-28 LAB — HCG, SERUM, QUALITATIVE: Preg, Serum: NEGATIVE

## 2023-07-28 LAB — MAGNESIUM: Magnesium: 2.7 mg/dL — ABNORMAL HIGH (ref 1.7–2.4)

## 2023-07-28 MED ORDER — OMNIPOD 5 DEXG7G6 INTRO GEN 5 KIT: PACK | 0 refills | Status: DC

## 2023-07-28 NOTE — ED Provider Notes (Signed)
  EMERGENCY DEPARTMENT AT [facility] Provider Note   CSN: [id number] Arrival date & time: 07/28/23  8169    Patient presents with: Hypoglycemia and possible seizure   Dawn [name] is a 23 y.o. female history of poorly controlled type 1 diabetes here for evaluation of hypoglycemia.  EMS called out for altered mental status.  GCS 3 with EMS. When they got there patient's blood sugar monitor (omnipod) read low.  Given D10 by EMS blood sugar in the 60s mentation cleared with improvement to the 150s. Family thought she maybe had a seizure as she has had that previously with her low blood sugars. Patient states her phone has not been connecting with her insulin  pump.  She has also been out of some of her insulin  has been giving herself Humalog --states was taken off her [name]  and NovoLog  on her recent discharge from the hospital in 7/17.  States she was drinking EtOH last night. She remembers this morning when she talked with the family member they told her was hot outside to not go outside.  The next thing she remembers was EMS standing over her which was hours later.  She denies any headache, nausea, vomiting, chest pain, shortness breath abdominal pain, numbness or weakness.  She is unsure if she could be pregnant.    Gave glucose (D10) with improvement in AMS.    Alcohol last night, thinks preg? Unknown last LMP     HPI     Prior to Admission medications   Medication Sig Start Date End Date Taking? Authorizing Provider  Continuous Glucose Sensor (DEXCOM G7 SENSOR) MISC Inject 1 Application into the skin as directed. 10/30/22   Diona Perkins, MD  escitalopram  (LEXAPRO ) 20 MG tablet Take 20 mg by mouth daily. 10/04/22 04/10/23  [provider]  Glucagon  (BAQSIMI  TWO PACK) 3 MG/DOSE POWD Place 3 mg into the nose as needed (low blood sugar).    [provider]  Insulin  Disposable Pump (OMNIPOD 5 DEXG7G6 INTRO GEN 5) KIT Use as prescribed 07/28/23   Danisha Brassfield,  Zaivion Kundrat A, PA-C  insulin  lispro (HUMALOG ) 100 UNIT/ML injection Inject 200 Units into the skin See admin instructions. Insert 200 units into omnipod. Change omnipod every three days 03/10/23   [provider]  Insulin  Pen Needle 32G X 4 MM MISC Use as directed with insulin . 04/11/23   Renne Homans, MD    Allergies: Latex, Tape, and Shellfish allergy    Review of Systems  Constitutional: Negative.   HENT: Negative.    Respiratory: Negative.    Cardiovascular: Negative.   Gastrointestinal: Negative.   Genitourinary: Negative.   Musculoskeletal: Negative.   Skin: Negative.   All other systems reviewed and are negative.   Updated Vital Signs BP 119/76 (BP Location: Left Arm)   Pulse 95   Temp 98.6 F (37 C) (Oral)   Resp 16   Ht 5' 2 (1.575 m)   Wt 59 kg   SpO2 100%   BMI 23.78 kg/m   Physical Exam Vitals and nursing note reviewed.  Constitutional:      General: She is not in acute distress.    Appearance: She is well-developed. She is not ill-appearing, toxic-appearing or diaphoretic.  HENT:     Head: Normocephalic and atraumatic.     Nose: Nose normal.     Mouth/Throat:     Lips: Pink.     Mouth: Mucous membranes are moist.     Pharynx: Oropharynx is clear. Uvula midline.  Comments: Abrasion distal aspect left tongue lacerations to suture Eyes:     Pupils: Pupils are equal, round, and reactive to light.  Cardiovascular:     Rate and Rhythm: Normal rate.     Pulses: Normal pulses.          Radial pulses are 2+ on the right side and 2+ on the left side.       Dorsalis pedis pulses are 2+ on the right side and 2+ on the left side.     Heart sounds: Normal heart sounds.  Pulmonary:     Effort: Pulmonary effort is normal. No respiratory distress.     Breath sounds: Normal breath sounds and air entry.  Abdominal:     General: Bowel sounds are normal. There is no distension.     Palpations: Abdomen is soft.     Tenderness: There is no abdominal tenderness.  There is no right CVA tenderness, left CVA tenderness or guarding.     Comments: Soft, nontender  Musculoskeletal:        General: No swelling, tenderness, deformity or signs of injury. Normal range of motion.     Cervical back: Normal range of motion.     Right lower leg: No edema.     Left lower leg: No edema.     Comments: No bony tenderness, compartment soft  Skin:    General: Skin is warm and dry.     Capillary Refill: Capillary refill takes less than 2 seconds.     Comments: No obvious rashes or lesions on exposed skin  Neurological:     General: No focal deficit present.     Mental Status: She is alert.     Cranial Nerves: Cranial nerves 2-12 are intact.     Sensory: Sensation is intact.     Motor: Motor function is intact.     Coordination: Coordination is intact.     Gait: Gait is intact.  Psychiatric:        Mood and Affect: Mood normal.     (all labs ordered are listed, but only abnormal results are displayed) Labs Reviewed  CBC WITH DIFFERENTIAL/PLATELET - Abnormal; Notable for the following components:      Result Value   Hemoglobin 9.9 (*)    HCT 33.2 (*)    MCH 25.0 (*)    MCHC 29.8 (*)    RDW 18.7 (*)    All other components within normal limits  COMPREHENSIVE METABOLIC PANEL WITH GFR - Abnormal; Notable for the following components:   Glucose, Bld 160 (*)    BUN 26 (*)    All other components within normal limits  MAGNESIUM  - Abnormal; Notable for the following components:   Magnesium  2.7 (*)    All other components within normal limits  RAPID URINE DRUG SCREEN, HOSP PERFORMED - Abnormal; Notable for the following components:   Tetrahydrocannabinol POSITIVE (*)    All other components within normal limits  URINALYSIS, W/ REFLEX TO CULTURE (INFECTION SUSPECTED) - Abnormal; Notable for the following components:   APPearance CLOUDY (*)    Glucose, UA >=500 (*)    Ketones, ur 20 (*)    Protein, ur 100 (*)    Leukocytes,Ua TRACE (*)    Bacteria, UA RARE  (*)    All other components within normal limits  BLOOD GAS, VENOUS - Abnormal; Notable for the following components:   pO2, Ven <31 (*)    All other components within normal limits  CBG MONITORING, ED -  Abnormal; Notable for the following components:   Glucose-Capillary 150 (*)    All other components within normal limits  CBG MONITORING, ED - Abnormal; Notable for the following components:   Glucose-Capillary 161 (*)    All other components within normal limits  CBG MONITORING, ED - Abnormal; Notable for the following components:   Glucose-Capillary 207 (*)    All other components within normal limits  HCG, SERUM, QUALITATIVE  ETHANOL  CBG MONITORING, ED  CBG MONITORING, ED  GC/CHLAMYDIA PROBE AMP ([place]) NOT AT [name]    EKG: None  Radiology: No results found.   Procedures   Medications Ordered in the ED - No data to display  23 year old here for evaluation of hypoglycemia.  EMS called out for hypoglycemia and altered mental status.  Apparently patient's OmniPod was reading low and alarming when EMS got there.  She had initially had a GCS of 3.  She subsequently given D10 with improvement in her blood sugar and altered mental status.  By the time she arrived to the emergency department she is neurovascularly intact, CBG 125.  She has no complaints.  She states instead of running the insulin  through her OmniPod that she has been giving it to her self subcutaneous. She does have an abrasion to her distal tongue.  She states she has had seizure-like activity before in setting of hypo and hyperglycemia.  Patient also requesting GC/chlamydia testing  Labs and imaging personally viewed and interpreted:  UDS positive for THC Ethanol less than 15 Pregnancy test negative Mag 2.7 UA neg infection  Patient observed in the emergency department for 4-1/2 hours no recurrence of hypoglycemia.  Thankfully no evidence of DKA either.  She has clear mentation.  Discharge was able to  tolerate p.o. intake.  I suspect her hypoglycemia likely her giving herself insulin  subcutaneous versus her OmniPod as she states it has not been connecting with her phone.  She does not have additional OmniPod's at home as she was post to go pick up her prescription.  Will write for refill of her OmniPod.  She states she does have insulin  at home.  Discussed close monitoring of her glucose.  She will return for any worsening symptoms.  No headache, numbness, weakness, persistent altered mental status.  She is a nonfocal neuroexam.  Do not feel she needs additional imaging at this time.  The patient has been appropriately medically screened and/or stabilized in the ED. I have low suspicion for any other emergent medical condition which would require further screening, evaluation or treatment in the ED or require inpatient management.  Patient is hemodynamically stable and in no acute distress.  Patient able to ambulate in department prior to ED.  Evaluation does not show acute pathology that would require ongoing or additional emergent interventions while in the emergency department or further inpatient treatment.  I have discussed the diagnosis with the patient and answered all questions.  Pain is been managed while in the emergency department and patient has no further complaints prior to discharge.  Patient is comfortable with plan discussed in room and is stable for discharge at this time.  I have discussed strict return precautions for returning to the emergency department.  Patient was encouraged to follow-up with PCP/specialist refer to at discharge.                                    Medical Decision Making  Amount and/or Complexity of Data Reviewed Independent Historian: EMS External Data Reviewed: labs, radiology, ECG and notes. Labs: ordered. Decision-making details documented in ED Course. Radiology: ordered and independent interpretation performed. Decision-making details documented in ED  Course. ECG/medicine tests: ordered and independent interpretation performed. Decision-making details documented in ED Course.  Risk OTC drugs. Prescription drug management. Decision regarding hospitalization. Diagnosis or treatment significantly limited by social determinants of health.       Final diagnoses:  Hypoglycemia    ED Discharge Orders          Ordered    Insulin  Disposable Pump (OMNIPOD 5 DEXG7G6 INTRO GEN 5) KIT        07/28/23 2307               Geanine Vandekamp A, PA-C 07/28/23 2323    Pamella Ozell LABOR, DO 07/30/23 1832

## 2023-07-28 NOTE — ED Triage Notes (Signed)
 Pt BIB ems for possible seizure last night due to a drop in her blood sugar over the night. Initial CBG taken by ems read 67. Fluids given by ems. Recent CBG reads 125. States she has pain in the legs as well as weakness. States she is out of her regular insulin  and has only been taking lispro. Also is concerned she may be pregnant. VS stable

## 2023-07-28 NOTE — Discharge Instructions (Signed)
 Please make sure to keep a close monitor of your blood sugars at home.  Follow-up with your endocrinologist  Return for any worsening symptoms.

## 2023-07-28 NOTE — Telephone Encounter (Addendum)
 Tried calling pt again still states call cannot be completed at this time.

## 2023-07-31 LAB — GC/CHLAMYDIA PROBE AMP (~~LOC~~) NOT AT ARMC
Chlamydia: NEGATIVE
Comment: NEGATIVE
Comment: NORMAL
Neisseria Gonorrhea: NEGATIVE

## 2023-08-01 ENCOUNTER — Emergency Department (HOSPITAL_COMMUNITY): Payer: MEDICAID

## 2023-08-01 ENCOUNTER — Inpatient Hospital Stay (HOSPITAL_COMMUNITY)
Admission: EM | Admit: 2023-08-01 | Discharge: 2023-08-04 | DRG: 637 | Disposition: A | Discharge status: Other Acute Inpatient Hospital | Payer: MEDICAID | Attending: Internal Medicine | Admitting: Internal Medicine

## 2023-08-01 ENCOUNTER — Other Ambulatory Visit: Payer: Self-pay

## 2023-08-01 DIAGNOSIS — F332 Major depressive disorder, recurrent severe without psychotic features: Secondary | ICD-10-CM | POA: Diagnosis present

## 2023-08-01 DIAGNOSIS — F419 Anxiety disorder, unspecified: Secondary | ICD-10-CM | POA: Diagnosis present

## 2023-08-01 DIAGNOSIS — Z91040 Latex allergy status: Secondary | ICD-10-CM

## 2023-08-01 DIAGNOSIS — N179 Acute kidney failure, unspecified: Secondary | ICD-10-CM | POA: Diagnosis present

## 2023-08-01 DIAGNOSIS — Z91013 Allergy to seafood: Secondary | ICD-10-CM | POA: Diagnosis not present

## 2023-08-01 DIAGNOSIS — Z560 Unemployment, unspecified: Secondary | ICD-10-CM | POA: Diagnosis not present

## 2023-08-01 DIAGNOSIS — F909 Attention-deficit hyperactivity disorder, unspecified type: Secondary | ICD-10-CM | POA: Diagnosis present

## 2023-08-01 DIAGNOSIS — D72829 Elevated white blood cell count, unspecified: Secondary | ICD-10-CM | POA: Diagnosis present

## 2023-08-01 DIAGNOSIS — Z5901 Sheltered homelessness: Secondary | ICD-10-CM | POA: Diagnosis not present

## 2023-08-01 DIAGNOSIS — E559 Vitamin D deficiency, unspecified: Secondary | ICD-10-CM | POA: Diagnosis present

## 2023-08-01 DIAGNOSIS — G9341 Metabolic encephalopathy: Secondary | ICD-10-CM | POA: Diagnosis present

## 2023-08-01 DIAGNOSIS — E876 Hypokalemia: Secondary | ICD-10-CM | POA: Diagnosis present

## 2023-08-01 DIAGNOSIS — E1010 Type 1 diabetes mellitus with ketoacidosis without coma: Principal | ICD-10-CM | POA: Diagnosis present

## 2023-08-01 DIAGNOSIS — N946 Dysmenorrhea, unspecified: Secondary | ICD-10-CM | POA: Diagnosis present

## 2023-08-01 DIAGNOSIS — E1110 Type 2 diabetes mellitus with ketoacidosis without coma: Secondary | ICD-10-CM | POA: Diagnosis present

## 2023-08-01 DIAGNOSIS — Z8249 Family history of ischemic heart disease and other diseases of the circulatory system: Secondary | ICD-10-CM | POA: Diagnosis not present

## 2023-08-01 DIAGNOSIS — E860 Dehydration: Secondary | ICD-10-CM | POA: Diagnosis present

## 2023-08-01 DIAGNOSIS — D509 Iron deficiency anemia, unspecified: Secondary | ICD-10-CM | POA: Diagnosis present

## 2023-08-01 DIAGNOSIS — R000 Tachycardia, unspecified: Secondary | ICD-10-CM | POA: Diagnosis present

## 2023-08-01 DIAGNOSIS — Z833 Family history of diabetes mellitus: Secondary | ICD-10-CM

## 2023-08-01 DIAGNOSIS — Z9151 Personal history of suicidal behavior: Secondary | ICD-10-CM

## 2023-08-01 DIAGNOSIS — F1290 Cannabis use, unspecified, uncomplicated: Secondary | ICD-10-CM | POA: Diagnosis present

## 2023-08-01 DIAGNOSIS — Z794 Long term (current) use of insulin: Secondary | ICD-10-CM | POA: Diagnosis not present

## 2023-08-01 DIAGNOSIS — E875 Hyperkalemia: Secondary | ICD-10-CM | POA: Diagnosis present

## 2023-08-01 DIAGNOSIS — Z825 Family history of asthma and other chronic lower respiratory diseases: Secondary | ICD-10-CM

## 2023-08-01 DIAGNOSIS — R45851 Suicidal ideations: Secondary | ICD-10-CM | POA: Diagnosis present

## 2023-08-01 DIAGNOSIS — E10649 Type 1 diabetes mellitus with hypoglycemia without coma: Secondary | ICD-10-CM | POA: Diagnosis not present

## 2023-08-01 DIAGNOSIS — Z608 Other problems related to social environment: Secondary | ICD-10-CM | POA: Diagnosis present

## 2023-08-01 DIAGNOSIS — Z91148 Patient's other noncompliance with medication regimen for other reason: Secondary | ICD-10-CM

## 2023-08-01 LAB — COMPREHENSIVE METABOLIC PANEL WITH GFR
ALT: 24 U/L (ref 0–44)
AST: 29 U/L (ref 15–41)
Albumin: 3.8 g/dL (ref 3.5–5.0)
Alkaline Phosphatase: 180 U/L — ABNORMAL HIGH (ref 38–126)
BUN: 38 mg/dL — ABNORMAL HIGH (ref 6–20)
CO2: <7 mmol/L — ABNORMAL LOW (ref 22–32)
Calcium: 8.9 mg/dL (ref 8.9–10.3)
Chloride: 91 mmol/L — ABNORMAL LOW (ref 98–111)
Creatinine, Ser: 1.83 mg/dL — ABNORMAL HIGH (ref 0.44–1.00)
GFR, Estimated: 40 mL/min — ABNORMAL LOW (ref >60)
Glucose, Bld: 836 mg/dL (ref 70–99)
Potassium: 7.3 mmol/L (ref 3.5–5.1)
Sodium: 125 mmol/L — ABNORMAL LOW (ref 135–145)
Total Bilirubin: 2.4 mg/dL — ABNORMAL HIGH (ref 0.0–1.2)
Total Protein: 8.6 g/dL — ABNORMAL HIGH (ref 6.5–8.1)

## 2023-08-01 LAB — I-STAT CHEM 8, ED
BUN: 35 mg/dL — ABNORMAL HIGH (ref 6–20)
Calcium, Ion: 1.16 mmol/L (ref 1.15–1.40)
Chloride: 103 mmol/L (ref 98–111)
Creatinine, Ser: 1.3 mg/dL — ABNORMAL HIGH (ref 0.44–1.00)
Glucose, Bld: >700 mg/dL (ref 70–99)
HCT: 44 % (ref 36.0–46.0)
Hemoglobin: 15 g/dL (ref 12.0–15.0)
Potassium: 7.2 mmol/L (ref 3.5–5.1)
Sodium: 125 mmol/L — ABNORMAL LOW (ref 135–145)
TCO2: 6 mmol/L — ABNORMAL LOW (ref 22–32)

## 2023-08-01 LAB — CBC
HCT: 41.4 % (ref 36.0–46.0)
Hemoglobin: 11.8 g/dL — ABNORMAL LOW (ref 12.0–15.0)
MCH: 25.5 pg — ABNORMAL LOW (ref 26.0–34.0)
MCHC: 28.5 g/dL — ABNORMAL LOW (ref 30.0–36.0)
MCV: 89.4 fL (ref 80.0–100.0)
Platelets: 530 K/uL — ABNORMAL HIGH (ref 150–400)
RBC: 4.63 MIL/uL (ref 3.87–5.11)
RDW: 19 % — ABNORMAL HIGH (ref 11.5–15.5)
WBC: 21.2 K/uL — ABNORMAL HIGH (ref 4.0–10.5)
nRBC: 0 % (ref 0.0–0.2)

## 2023-08-01 LAB — CBG MONITORING, ED
Glucose-Capillary: >600 mg/dL (ref 70–99)
Glucose-Capillary: 524 mg/dL (ref 70–99)
Glucose-Capillary: >600 mg/dL (ref 70–99)
Glucose-Capillary: 385 mg/dL — ABNORMAL HIGH (ref 70–99)

## 2023-08-01 LAB — BETA-HYDROXYBUTYRIC ACID: Beta-Hydroxybutyric Acid: >8 mmol/L — ABNORMAL HIGH (ref 0.05–0.27)

## 2023-08-01 LAB — GLUCOSE, CAPILLARY: Glucose-Capillary: 210 mg/dL — ABNORMAL HIGH (ref 70–99)

## 2023-08-01 LAB — I-STAT CG4 LACTIC ACID, ED
Lactic Acid, Venous: 2.4 mmol/L (ref 0.5–1.9)
Lactic Acid, Venous: 4.6 mmol/L (ref 0.5–1.9)

## 2023-08-01 LAB — HCG, SERUM, QUALITATIVE: Preg, Serum: NEGATIVE

## 2023-08-01 MED ORDER — POLYETHYLENE GLYCOL 3350 17 G PO PACK: 17.0000 g | PACK | Freq: Every day | ORAL | Status: DC | PRN

## 2023-08-01 MED ORDER — LACTATED RINGERS IV SOLN: INTRAVENOUS | Status: AC

## 2023-08-01 MED ORDER — ENOXAPARIN SODIUM 40 MG/0.4ML IJ SOSY
40.0000 mg | PREFILLED_SYRINGE | INTRAMUSCULAR | Status: DC
  Filled 2023-08-01 (×3): qty 0.4

## 2023-08-01 MED ORDER — SODIUM BICARBONATE 8.4 % IV SOLN
50.0000 meq | Freq: Once | INTRAVENOUS | Status: AC
  Administered 2023-08-01: 50 meq via INTRAVENOUS
  Filled 2023-08-01: qty 50

## 2023-08-01 MED ORDER — SODIUM CHLORIDE 0.9 % IV BOLUS
2000.0000 mL | Freq: Once | INTRAVENOUS | Status: AC
  Administered 2023-08-01: 1000 mL via INTRAVENOUS

## 2023-08-01 MED ORDER — LACTATED RINGERS IV BOLUS
20.0000 mL/kg | Freq: Once | INTRAVENOUS | Status: AC
  Administered 2023-08-01: 1180 mL via INTRAVENOUS

## 2023-08-01 MED ORDER — DOCUSATE SODIUM 100 MG PO CAPS: 100.0000 mg | ORAL_CAPSULE | Freq: Two times a day (BID) | ORAL | Status: DC | PRN

## 2023-08-01 MED ORDER — DEXTROSE IN LACTATED RINGERS 5 % IV SOLN: INTRAVENOUS | Status: AC

## 2023-08-01 MED ORDER — DEXTROSE 50 % IV SOLN: 0.0000 mL | INTRAVENOUS | Status: DC | PRN

## 2023-08-01 MED ORDER — INSULIN ASPART 100 UNIT/ML IJ SOLN
20.0000 [IU] | Freq: Once | INTRAMUSCULAR | Status: AC
  Administered 2023-08-01: 20 [IU] via SUBCUTANEOUS
  Filled 2023-08-01: qty 0.2

## 2023-08-01 MED ORDER — CHLORHEXIDINE GLUCONATE CLOTH 2 % EX PADS
6.0000 | MEDICATED_PAD | Freq: Every day | CUTANEOUS | Status: DC
  Administered 2023-08-01 – 2023-08-04 (×4): 6 via TOPICAL

## 2023-08-01 MED ORDER — INSULIN REGULAR(HUMAN) IN NACL 100-0.9 UT/100ML-% IV SOLN
INTRAVENOUS | Status: AC
  Administered 2023-08-01: 7 [IU]/h via INTRAVENOUS
  Filled 2023-08-01 (×2): qty 100

## 2023-08-01 MED ORDER — FAMOTIDINE IN NACL 20-0.9 MG/50ML-% IV SOLN
20.0000 mg | INTRAVENOUS | Status: DC
  Administered 2023-08-02 – 2023-08-03 (×3): 20 mg via INTRAVENOUS
  Filled 2023-08-01 (×2): qty 50

## 2023-08-01 MED ORDER — CALCIUM GLUCONATE-NACL 1-0.675 GM/50ML-% IV SOLN
1.0000 g | Freq: Once | INTRAVENOUS | Status: AC
  Administered 2023-08-01: 1000 mg via INTRAVENOUS
  Filled 2023-08-01: qty 50

## 2023-08-01 NOTE — ED Notes (Signed)
 Date and time results received: 08/01/23 10:37 PM  (use smartphrase .now to insert current time)  Test: [facility] Critical Value: 6.99  Test: PO2 Critical Value:44  Name of Provider Notified: [place]

## 2023-08-01 NOTE — Progress Notes (Signed)
 ABG that was collected is venous, made RN aware. Pt 100% on RA.

## 2023-08-01 NOTE — ED Triage Notes (Signed)
 Pt BIBA from home w/ cc of being out of diabetic medications and  hyperglycemic x2 days. [id number] upon EMS arrival. Pt also endorses n/v but denies any fevers or chills.
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
## 2023-08-01 NOTE — Progress Notes (Signed)
Unit staff able to establish PIV

## 2023-08-01 NOTE — ED Provider Notes (Signed)
 [place] EMERGENCY DEPARTMENT AT [facility] Provider Note   CSN: [id number] Arrival date & time: 08/01/23  1906     Patient presents with: Hyperglycemia   Dawn [name] is a 23 y.o. female presents to the ED today by EMS after having been out of her diabetic medications and noted hyperglycemia x 2 days.  According to EMS blood sugar is 534 in the ambulance however they were unable to begin any fluids due to poor IV access.  This patient has a history of type 1 diabetes that is poorly managed, history of major depressive disorder.  Patient was seen in this ED on 25 July for hypoglycemia, and loss of consciousness.  Prior to this was admitted on 7/17 secondary to hyperglycemia and DKA.  She has been placed on multiple medications, including transition to subcu insulin , however she has not been adherent to these medications.    Hyperglycemia Associated symptoms: fatigue and weakness        Prior to Admission medications   Medication Sig Start Date End Date Taking? Authorizing Provider  Continuous Glucose Sensor (DEXCOM G7 SENSOR) MISC Inject 1 Application into the skin as directed. 10/30/22   Diona Perkins, MD  escitalopram  (LEXAPRO ) 20 MG tablet Take 20 mg by mouth daily. 10/04/22 04/10/23  [provider]  Glucagon  (BAQSIMI  TWO PACK) 3 MG/DOSE POWD Place 3 mg into the nose as needed (low blood sugar).    [provider]  Insulin  Disposable Pump (OMNIPOD 5 DEXG7G6 INTRO GEN 5) KIT Use as prescribed 07/28/23   Henderly, Britni A, PA-C  insulin  lispro (HUMALOG ) 100 UNIT/ML injection Inject 200 Units into the skin See admin instructions. Insert 200 units into omnipod. Change omnipod every three days 03/10/23   [provider]  Insulin  Pen Needle 32G X 4 MM MISC Use as directed with insulin . 04/11/23   Renne Homans, MD    Allergies: Latex, Tape, and Shellfish allergy    Review of Systems  Constitutional:  Positive for activity change and fatigue.   Neurological:  Positive for weakness.  All other systems reviewed and are negative.   Updated Vital Signs BP (!) 136/96 (BP Location: Right Arm)   Pulse (!) 129   Temp 97.8 F (36.6 C) (Oral)   Resp (!) 28   Ht 5' 3 (1.6 m)   Wt 59 kg   SpO2 100%   BMI 23.04 kg/m   Physical Exam Vitals and nursing note reviewed.  Constitutional:      General: She is not in acute distress.    Appearance: Normal appearance.  HENT:     Head: Normocephalic and atraumatic.     Mouth/Throat:     Mouth: Mucous membranes are moist.     Pharynx: Oropharynx is clear.  Eyes:     General: Lids are normal. Vision grossly intact. Gaze aligned appropriately.     Extraocular Movements: Extraocular movements intact.     Conjunctiva/sclera: Conjunctivae normal.     Pupils: Pupils are equal, round, and reactive to light.  Cardiovascular:     Rate and Rhythm: Regular rhythm. Tachycardia present.     Pulses: Normal pulses.     Heart sounds: Normal heart sounds. No murmur heard.    No friction rub. No gallop.  Pulmonary:     Effort: Tachypnea present.     Breath sounds: Normal breath sounds and air entry.  Abdominal:     General: Abdomen is flat. Bowel sounds are normal.     Palpations: Abdomen is  soft.  Musculoskeletal:        General: Normal range of motion.     Cervical back: Normal range of motion and neck supple.     Right lower leg: No edema.     Left lower leg: No edema.  Skin:    General: Skin is warm and dry.     Capillary Refill: Capillary refill takes less than 2 seconds.  Neurological:     General: No focal deficit present.     Mental Status: She is lethargic and disoriented.     GCS: GCS eye subscore is 3. GCS verbal subscore is 3. GCS motor subscore is 5.     Cranial Nerves: No cranial nerve deficit, dysarthria or facial asymmetry.     Sensory: No sensory deficit.     Motor: Weakness present.  Psychiatric:        Mood and Affect: Mood normal.     (all labs ordered are listed,  but only abnormal results are displayed) Labs Reviewed  CBC - Abnormal; Notable for the following components:      Result Value   WBC 21.2 (*)    Hemoglobin 11.8 (*)    MCH 25.5 (*)    MCHC 28.5 (*)    RDW 19.0 (*)    Platelets 530 (*)    All other components within normal limits  CBG MONITORING, ED - Abnormal; Notable for the following components:   Glucose-Capillary >600 (*)    All other components within normal limits  I-STAT CG4 LACTIC ACID, ED - Abnormal; Notable for the following components:   Lactic Acid, Venous 2.4 (*)    All other components within normal limits  I-STAT CHEM 8, ED - Abnormal; Notable for the following components:   Sodium 125 (*)    Potassium 7.2 (*)    BUN 35 (*)    Creatinine, Ser 1.30 (*)    Glucose, Bld >700 (*)    TCO2 6 (*)    All other components within normal limits  CBG MONITORING, ED - Abnormal; Notable for the following components:   Glucose-Capillary >600 (*)    All other components within normal limits  URINALYSIS, ROUTINE W REFLEX MICROSCOPIC  HCG, SERUM, QUALITATIVE  BETA-HYDROXYBUTYRIC ACID  COMPREHENSIVE METABOLIC PANEL WITH GFR  BLOOD GAS, ARTERIAL    EKG: EKG Interpretation Date/Time:  Tuesday August 01 2023 19:48:37 EDT Ventricular Rate:  128 PR Interval:  117 QRS Duration:  84 QT Interval:  309 QTC Calculation: 451 R Axis:   73  Text Interpretation: Sinus tachycardia LAE, consider biatrial enlargement ST elev, probable normal early repol pattern Confirmed by Suzette Pac [phone]) on 08/01/2023 8:22:00 PM  Radiology: No results found.   SABRAUltrasound ED Peripheral IV (Provider)  Date/Time: 08/01/2023 8:13 PM  Performed by: Myriam Dorn BROCKS, PA Authorized by: Myriam Dorn BROCKS, PA   Procedure details:    Indications: hydration, multiple failed IV attempts and poor IV access     Skin Prep: chlorhexidine  gluconate     Location:  Left AC   Angiocath:  20 G   Bedside Ultrasound Guided: Yes     Images: not archived      Patient tolerated procedure without complications: Yes     Dressing applied: Yes   .Critical Care  Performed by: Myriam Dorn BROCKS, PA Authorized by: Myriam Dorn BROCKS, PA   Critical care provider statement:    Critical care time (minutes):  30   Critical care time was exclusive of:  Separately billable procedures  and treating other patients   Critical care was necessary to treat or prevent imminent or life-threatening deterioration of the following conditions:  Dehydration and metabolic crisis   Critical care was time spent personally by me on the following activities:  Blood draw for specimens, development of treatment plan with patient or surrogate, discussions with consultants, evaluation of patient's response to treatment, examination of patient, obtaining history from patient or surrogate, ordering and performing treatments and interventions, ordering and review of laboratory studies, ordering and review of radiographic studies, pulse oximetry, re-evaluation of patient's condition and vascular access procedures    Medications Ordered in the ED  lactated ringers  infusion (has no administration in time range)  dextrose  5 % in lactated ringers  infusion (has no administration in time range)  dextrose  50 % solution 0-50 mL (has no administration in time range)  insulin  regular, human (MYXREDLIN ) 100 units/ 100 mL infusion (7 Units/hr Intravenous New Bag/Given 08/01/23 2047)  calcium  gluconate 1 g/ 50 mL sodium chloride  IVPB (1,000 mg Intravenous New Bag/Given 08/01/23 2036)  sodium bicarbonate  injection 50 mEq (has no administration in time range)  insulin  aspart (novoLOG ) injection 20 Units (20 Units Subcutaneous Given 08/01/23 1944)  sodium chloride  0.9 % bolus 2,000 mL (1,000 mLs Intravenous Bolus 08/01/23 2024)  lactated ringers  bolus 1,180 mL (1,180 mLs Intravenous Bolus 08/01/23 2017)                                    Medical Decision Making Amount and/or Complexity of Data  Reviewed Labs: ordered. Radiology: ordered.  Risk Prescription drug management. Decision regarding hospitalization.   Medical Decision Making:   Dawn [name] is a 23 y.o. female who presented to the ED today with altered LOC and likely hyperglycemia detailed above.    Handoff received from EMS.  External chart has been reviewed including previous lab results and previous admissions records.. Patient's presentation is complicated by their history of type 1 diabetes with nonadherence to medications.  Patient placed on continuous vitals and telemetry monitoring while in ED which was reviewed periodically.  Complete initial physical exam performed, notably the patient  was not alert, lethargic.    Reviewed and confirmed nursing documentation for past medical history, family history, social history.    Initial Assessment:   With the patient's presentation of altered LOC, most likely diagnosis is hyperglycemia with likely DKA.  Consider HHNK, neurovascular insult.  Initial Plan:  Begin IV hydration with lactated Ringer 's, begin insulin  therapy with bolus as well as insulin  drip as noted Screening labs including CBC and Metabolic panel to evaluate for infectious or metabolic etiology of disease.  Urinalysis with reflex culture ordered to evaluate for UTI or relevant urologic/nephrologic pathology.  CXR to evaluate for structural/infectious intrathoracic pathology.  EKG to evaluate for cardiac pathology Objective evaluation as below reviewed   Initial Study Results:   Laboratory  All laboratory results reviewed without evidence of clinically relevant pathology.   Exceptions include: Lactate is 2.4.  Glucose is greater than 700.  Potassium 7.2.  She has a leukocytosis of 21.2.  EKG EKG was reviewed independently.  She is tachycardic with peaked T waves observed, concerning for clinically significant hyperkalemia.  Radiology:  All images reviewed independently. Agree with radiology  report at this time.   DG Chest 1 View Result Date: 07/19/2023 CLINICAL DATA:  Emesis EXAM: PORTABLE CHEST 1 VIEW COMPARISON:  01/02/2023 FINDINGS: The heart size and mediastinal  contours are within normal limits. Both lungs are clear. The visualized skeletal structures are unremarkable. IMPRESSION: No active disease. Electronically Signed   By: Oneil Devonshire M.D.   On: 07/19/2023 02:44      Consults: Case discussed with Dr. Dub with critical care.   Reassessment and Plan:   After assessing her initial labs, she is profoundly hyperkalemic and have begun calcium  gluconate as well as sodium bicarb for initial management.  Further continuing IV hydration with lactated Ringer 's and normal saline, continuing insulin  therapy.  Based on her severe presentation, will consult with critical care secondary to DKA and hyperkalemia.  Plan is for admission for continued IV hydration, management of her hyperkalemia and DKA.  After initial management for DKA and hyperkalemia, consulted with critical care as previously noted, they are accepting patient for admission at this time for continued management of DKA and hyperkalemia.       Final diagnoses:  Diabetic ketoacidosis without coma associated with type 1 diabetes mellitus (HCC)  AKI (acute kidney injury) [facility])  Hyperkalemia    ED Discharge Orders     None          Myriam Dorn BROCKS, [place] 08/01/23 2113    Suzette Pac, MD 08/02/23 1155

## 2023-08-01 NOTE — Progress Notes (Incomplete)
 eLink Physician-Brief Progress Note Patient Name: Samiyah Stupka DOB: 2000-12-28 MRN: [id number]   Date of Service  08/01/2023  HPI/Events of Note  23 year old woman with a history of poorly controlled type 1 diabetes mellitus. She has had over numerous admissions in the last 12 months or ED visits related to her diabetes. Presents with DKA   Vitals, laboratory studies, and imaging reviewed and consistent with DKA and hemoconcentration  eICU Interventions  Continue DKA protocol with aggressive rehydration, electrolyte repletion, and insulin  infusion.  BMP checks  DVT prophylaxis with enoxaparin  GI prophylaxis not indicated.  Famotidine  in place of home pantoprazole .      Kilian Schwartz 08/01/2023, 11:52 PM

## 2023-08-01 NOTE — Progress Notes (Signed)
 Lab received ABG.

## 2023-08-01 NOTE — Progress Notes (Addendum)
 eLink Physician-Brief Progress Note Patient Name: Dawn [name] DOB: 05-04-2000 MRN: [id number]   Date of Service  08/01/2023  HPI/Events of Note  23 year old woman with a history of poorly controlled type 1 diabetes mellitus. She has had over numerous admissions in the last 12 months or ED visits related to her diabetes. Presents with DKA   Vitals, laboratory studies, and imaging reviewed and consistent with DKA and hemoconcentration  eICU Interventions  Continue DKA protocol with aggressive rehydration, electrolyte repletion, and insulin  infusion.  BMP checks  DVT prophylaxis with enoxaparin  GI prophylaxis not indicated.  Famotidine  in place of home pantoprazole .   0035 -dry heaving, add antiemetics  0430 -add Compazine  for refractory emesis/nausea   0546 - Having an issue with IV access now, more tachycardic. Additional fluid bolus     Dawn [name] 08/01/2023, 11:52 PM

## 2023-08-02 ENCOUNTER — Other Ambulatory Visit (HOSPITAL_COMMUNITY): Payer: Self-pay

## 2023-08-02 ENCOUNTER — Inpatient Hospital Stay (HOSPITAL_COMMUNITY): Payer: MEDICAID

## 2023-08-02 ENCOUNTER — Telehealth (HOSPITAL_COMMUNITY): Payer: Self-pay | Admitting: Pharmacy Technician

## 2023-08-02 DIAGNOSIS — N179 Acute kidney failure, unspecified: Secondary | ICD-10-CM | POA: Diagnosis not present

## 2023-08-02 DIAGNOSIS — E875 Hyperkalemia: Secondary | ICD-10-CM | POA: Diagnosis not present

## 2023-08-02 DIAGNOSIS — E1010 Type 1 diabetes mellitus with ketoacidosis without coma: Secondary | ICD-10-CM | POA: Diagnosis not present

## 2023-08-02 LAB — BASIC METABOLIC PANEL WITH GFR
Anion gap: 23 — ABNORMAL HIGH (ref 5–15)
Anion gap: 23 — ABNORMAL HIGH (ref 5–15)
Anion gap: 17 — ABNORMAL HIGH (ref 5–15)
Anion gap: 14 (ref 5–15)
Anion gap: 7 (ref 5–15)
Anion gap: 10 (ref 5–15)
BUN: 28 mg/dL — ABNORMAL HIGH (ref 6–20)
BUN: 26 mg/dL — ABNORMAL HIGH (ref 6–20)
BUN: 23 mg/dL — ABNORMAL HIGH (ref 6–20)
BUN: 18 mg/dL (ref 6–20)
BUN: 14 mg/dL (ref 6–20)
BUN: 13 mg/dL (ref 6–20)
CO2: 10 mmol/L — ABNORMAL LOW (ref 22–32)
CO2: 8 mmol/L — ABNORMAL LOW (ref 22–32)
CO2: 10 mmol/L — ABNORMAL LOW (ref 22–32)
CO2: 14 mmol/L — ABNORMAL LOW (ref 22–32)
CO2: 19 mmol/L — ABNORMAL LOW (ref 22–32)
CO2: 20 mmol/L — ABNORMAL LOW (ref 22–32)
Calcium: 9.3 mg/dL (ref 8.9–10.3)
Calcium: 8.9 mg/dL (ref 8.9–10.3)
Calcium: 9.2 mg/dL (ref 8.9–10.3)
Calcium: 8.8 mg/dL — ABNORMAL LOW (ref 8.9–10.3)
Calcium: 8.5 mg/dL — ABNORMAL LOW (ref 8.9–10.3)
Calcium: 8.8 mg/dL — ABNORMAL LOW (ref 8.9–10.3)
Chloride: 107 mmol/L (ref 98–111)
Chloride: 107 mmol/L (ref 98–111)
Chloride: 114 mmol/L — ABNORMAL HIGH (ref 98–111)
Chloride: 116 mmol/L — ABNORMAL HIGH (ref 98–111)
Chloride: 117 mmol/L — ABNORMAL HIGH (ref 98–111)
Chloride: 112 mmol/L — ABNORMAL HIGH (ref 98–111)
Creatinine, Ser: 1.23 mg/dL — ABNORMAL HIGH (ref 0.44–1.00)
Creatinine, Ser: 1.2 mg/dL — ABNORMAL HIGH (ref 0.44–1.00)
Creatinine, Ser: 1.23 mg/dL — ABNORMAL HIGH (ref 0.44–1.00)
Creatinine, Ser: 1.07 mg/dL — ABNORMAL HIGH (ref 0.44–1.00)
Creatinine, Ser: 0.91 mg/dL (ref 0.44–1.00)
Creatinine, Ser: 0.9 mg/dL (ref 0.44–1.00)
GFR, Estimated: >60 mL/min (ref >60)
GFR, Estimated: >60 mL/min (ref >60)
GFR, Estimated: >60 mL/min (ref >60)
GFR, Estimated: >60 mL/min (ref >60)
GFR, Estimated: >60 mL/min (ref >60)
GFR, Estimated: >60 mL/min (ref >60)
Glucose, Bld: 238 mg/dL — ABNORMAL HIGH (ref 70–99)
Glucose, Bld: 385 mg/dL — ABNORMAL HIGH (ref 70–99)
Glucose, Bld: 210 mg/dL — ABNORMAL HIGH (ref 70–99)
Glucose, Bld: 152 mg/dL — ABNORMAL HIGH (ref 70–99)
Glucose, Bld: 143 mg/dL — ABNORMAL HIGH (ref 70–99)
Glucose, Bld: 204 mg/dL — ABNORMAL HIGH (ref 70–99)
Potassium: 4.5 mmol/L (ref 3.5–5.1)
Potassium: 5.4 mmol/L — ABNORMAL HIGH (ref 3.5–5.1)
Potassium: 5 mmol/L (ref 3.5–5.1)
Potassium: 4.2 mmol/L (ref 3.5–5.1)
Potassium: 3.7 mmol/L (ref 3.5–5.1)
Potassium: 3.6 mmol/L (ref 3.5–5.1)
Sodium: 140 mmol/L (ref 135–145)
Sodium: 138 mmol/L (ref 135–145)
Sodium: 141 mmol/L (ref 135–145)
Sodium: 144 mmol/L (ref 135–145)
Sodium: 143 mmol/L (ref 135–145)
Sodium: 142 mmol/L (ref 135–145)

## 2023-08-02 LAB — URINALYSIS, ROUTINE W REFLEX MICROSCOPIC
Bacteria, UA: NONE SEEN
Bilirubin Urine: NEGATIVE
Glucose, UA: >=500 mg/dL — AB
Ketones, ur: 80 mg/dL — AB
Leukocytes,Ua: NEGATIVE
Nitrite: NEGATIVE
Protein, ur: 30 mg/dL — AB
Specific Gravity, Urine: 1.016 (ref 1.005–1.030)
pH: 5 (ref 5.0–8.0)

## 2023-08-02 LAB — GLUCOSE, CAPILLARY
Glucose-Capillary: 133 mg/dL — ABNORMAL HIGH (ref 70–99)
Glucose-Capillary: 140 mg/dL — ABNORMAL HIGH (ref 70–99)
Glucose-Capillary: 140 mg/dL — ABNORMAL HIGH (ref 70–99)
Glucose-Capillary: 141 mg/dL — ABNORMAL HIGH (ref 70–99)
Glucose-Capillary: 142 mg/dL — ABNORMAL HIGH (ref 70–99)
Glucose-Capillary: 143 mg/dL — ABNORMAL HIGH (ref 70–99)
Glucose-Capillary: 143 mg/dL — ABNORMAL HIGH (ref 70–99)
Glucose-Capillary: 153 mg/dL — ABNORMAL HIGH (ref 70–99)
Glucose-Capillary: 153 mg/dL — ABNORMAL HIGH (ref 70–99)
Glucose-Capillary: 155 mg/dL — ABNORMAL HIGH (ref 70–99)
Glucose-Capillary: 165 mg/dL — ABNORMAL HIGH (ref 70–99)
Glucose-Capillary: 169 mg/dL — ABNORMAL HIGH (ref 70–99)
Glucose-Capillary: 172 mg/dL — ABNORMAL HIGH (ref 70–99)
Glucose-Capillary: 206 mg/dL — ABNORMAL HIGH (ref 70–99)
Glucose-Capillary: 225 mg/dL — ABNORMAL HIGH (ref 70–99)
Glucose-Capillary: 259 mg/dL — ABNORMAL HIGH (ref 70–99)
Glucose-Capillary: 271 mg/dL — ABNORMAL HIGH (ref 70–99)
Glucose-Capillary: 338 mg/dL — ABNORMAL HIGH (ref 70–99)
Glucose-Capillary: 389 mg/dL — ABNORMAL HIGH (ref 70–99)
Glucose-Capillary: 412 mg/dL — ABNORMAL HIGH (ref 70–99)

## 2023-08-02 LAB — CBC
HCT: 38.3 % (ref 36.0–46.0)
Hemoglobin: 10.9 g/dL — ABNORMAL LOW (ref 12.0–15.0)
MCH: 25.1 pg — ABNORMAL LOW (ref 26.0–34.0)
MCHC: 28.5 g/dL — ABNORMAL LOW (ref 30.0–36.0)
MCV: 88 fL (ref 80.0–100.0)
Platelets: 366 K/uL (ref 150–400)
RBC: 4.35 MIL/uL (ref 3.87–5.11)
RDW: 18.9 % — ABNORMAL HIGH (ref 11.5–15.5)
WBC: 16.7 K/uL — ABNORMAL HIGH (ref 4.0–10.5)
nRBC: 0 % (ref 0.0–0.2)

## 2023-08-02 LAB — BETA-HYDROXYBUTYRIC ACID
Beta-Hydroxybutyric Acid: 7.82 mmol/L — ABNORMAL HIGH (ref 0.05–0.27)
Beta-Hydroxybutyric Acid: >8 mmol/L — ABNORMAL HIGH (ref 0.05–0.27)
Beta-Hydroxybutyric Acid: 6.48 mmol/L — ABNORMAL HIGH (ref 0.05–0.27)
Beta-Hydroxybutyric Acid: 3.6 mmol/L — ABNORMAL HIGH (ref 0.05–0.27)
Beta-Hydroxybutyric Acid: 1.55 mmol/L — ABNORMAL HIGH (ref 0.05–0.27)
Beta-Hydroxybutyric Acid: 0.97 mmol/L — ABNORMAL HIGH (ref 0.05–0.27)

## 2023-08-02 LAB — MRSA NEXT GEN BY PCR, NASAL: MRSA by PCR Next Gen: DETECTED — AB

## 2023-08-02 LAB — LACTIC ACID, PLASMA: Lactic Acid, Venous: 2.4 mmol/L (ref 0.5–1.9)

## 2023-08-02 MED ORDER — CHLORHEXIDINE GLUCONATE CLOTH 2 % EX PADS: 6.0000 | MEDICATED_PAD | Freq: Every day | CUTANEOUS | Status: DC

## 2023-08-02 MED ORDER — INSULIN ASPART 100 UNIT/ML IJ SOLN
0.0000 [IU] | Freq: Three times a day (TID) | INTRAMUSCULAR | Status: DC
  Administered 2023-08-03: 3 [IU] via SUBCUTANEOUS
  Administered 2023-08-03: 8 [IU] via SUBCUTANEOUS
  Administered 2023-08-04: 2 [IU] via SUBCUTANEOUS
  Administered 2023-08-04: 5 [IU] via SUBCUTANEOUS

## 2023-08-02 MED ORDER — SODIUM CHLORIDE 0.9% FLUSH: 10.0000 mL | INTRAVENOUS | Status: DC | PRN

## 2023-08-02 MED ORDER — ENSURE PLUS HIGH PROTEIN PO LIQD
237.0000 mL | Freq: Two times a day (BID) | ORAL | Status: DC
  Administered 2023-08-03 – 2023-08-04 (×4): 237 mL via ORAL

## 2023-08-02 MED ORDER — SODIUM CHLORIDE 0.9 % IV BOLUS
1000.0000 mL | Freq: Once | INTRAVENOUS | Status: AC
  Administered 2023-08-02: 1000 mL via INTRAVENOUS

## 2023-08-02 MED ORDER — LIDOCAINE HCL 1 % IJ SOLN
INTRAMUSCULAR | Status: AC
  Administered 2023-08-02: 10 mL via INTRADERMAL
  Filled 2023-08-02: qty 20

## 2023-08-02 MED ORDER — FENTANYL CITRATE PF 50 MCG/ML IJ SOSY
PREFILLED_SYRINGE | INTRAMUSCULAR | Status: AC
  Administered 2023-08-02: 50 ug via INTRAVENOUS
  Filled 2023-08-02: qty 1

## 2023-08-02 MED ORDER — ONDANSETRON HCL 4 MG/2ML IJ SOLN
4.0000 mg | Freq: Four times a day (QID) | INTRAMUSCULAR | Status: DC | PRN
  Administered 2023-08-02: 4 mg via INTRAVENOUS
  Filled 2023-08-02: qty 2

## 2023-08-02 MED ORDER — MUPIROCIN 2 % EX OINT
1.0000 | TOPICAL_OINTMENT | Freq: Two times a day (BID) | CUTANEOUS | Status: DC
  Administered 2023-08-02 – 2023-08-04 (×6): 1 via NASAL
  Filled 2023-08-02 (×3): qty 22

## 2023-08-02 MED ORDER — INSULIN GLARGINE-YFGN 100 UNIT/ML ~~LOC~~ SOLN
20.0000 [IU] | SUBCUTANEOUS | Status: DC
  Administered 2023-08-02 – 2023-08-03 (×2): 20 [IU] via SUBCUTANEOUS
  Filled 2023-08-02 (×4): qty 0.2

## 2023-08-02 MED ORDER — PROCHLORPERAZINE EDISYLATE 10 MG/2ML IJ SOLN
10.0000 mg | INTRAMUSCULAR | Status: DC | PRN
  Administered 2023-08-02: 10 mg via INTRAVENOUS
  Filled 2023-08-02: qty 2

## 2023-08-02 MED ORDER — LACTATED RINGERS IV BOLUS
1000.0000 mL | Freq: Once | INTRAVENOUS | Status: AC
  Administered 2023-08-02: 1000 mL via INTRAVENOUS

## 2023-08-02 MED ORDER — LIDOCAINE HCL 1 % IJ SOLN: 10.0000 mL | Freq: Once | INTRAMUSCULAR | Status: AC

## 2023-08-02 MED ORDER — SODIUM CHLORIDE 0.9% FLUSH
10.0000 mL | Freq: Two times a day (BID) | INTRAVENOUS | Status: DC
  Administered 2023-08-02 – 2023-08-04 (×4): 10 mL

## 2023-08-02 MED ORDER — INSULIN ASPART 100 UNIT/ML IJ SOLN: 0.0000 [IU] | Freq: Every day | INTRAMUSCULAR | Status: DC

## 2023-08-02 MED ORDER — ORAL CARE MOUTH RINSE: 15.0000 mL | OROMUCOSAL | Status: DC | PRN

## 2023-08-02 MED ORDER — FENTANYL CITRATE PF 50 MCG/ML IJ SOSY: 50.0000 ug | PREFILLED_SYRINGE | Freq: Once | INTRAMUSCULAR | Status: AC

## 2023-08-02 NOTE — Telephone Encounter (Signed)
 Patient Product/process development scientist completed.    The patient is insured through [employer] [place].     Ran test claim for Dexcom G7 Sensor and the current 30 day co-pay is $0.00.   This test claim was processed through [facility]- copay amounts may vary at other pharmacies due to pharmacy/plan contracts, or as the patient moves through the different stages of their insurance plan.     Reyes Sharps, CPHT Pharmacy Technician III Certified Patient Advocate [facility] Patient Advocate Team Direct Number: [phone]  Fax: [phone]

## 2023-08-02 NOTE — Progress Notes (Signed)
 NAMEKemani [name], MRN:  [id number], DOB:  11/06/2000, LOS: 1 ADMISSION DATE:  08/01/2023, CONSULTATION DATE: 07/19/2023 REFERRING MD: ED MD, CHIEF COMPLAINT:  Nausea and vomiting for one day   History of Present Illness:  A 23 yr old female patient with IDDM (insulin  pump, poorly controlled, last HbA1c 11.7% 2 weeks ago), Fe def anemia, depression, and vitamin D  deficiency with multiple admissions due to DKA who presents to ED with N/V today. She has been out of her diabetic medications and noted hyperglycemia x 2 days.  According to EMS blood sugar is 534 mg/dl in the ambulance. She has been placed on multiple medications, including transition to subcu insulin ; however, she has not been adherent to these medications. Started on DKA protocol in ED. Hx is limited due to lethargy.   Pertinent  Medical History  IDDM (insulin  pump), Fe def anemia, vitamin D  deficiency, depression   Significant Hospital Events: Including procedures, antibiotic start and stop dates in addition to other pertinent events   DKA protocol  Interim History / Subjective:  This morning has only 24g PIV and unable to get us  piv. Patient is encephalopathic, unable answer questions or follow commands. Still hasn't been able to get the fluid that was ordered in the ED. Insulin  drip running.   Objective    Blood pressure (!) 144/84, pulse (!) 140, temperature 97.6 F (36.4 C), temperature source Axillary, resp. rate (!) 30, height 5' 3 (1.6 m), weight 50.5 kg, SpO2 100%.        Intake/Output Summary (Last 24 hours) at 08/02/2023 9077 Last data filed at 08/02/2023 0600 Gross per 24 hour  Intake 1068.21 ml  Output 50 ml  Net 1018.21 ml    Filed Weights   08/01/23 1931 08/02/23 0000 08/02/23 0100  Weight: 59 kg 50.5 kg 50.5 kg    Examination: Gen:      Weak, lethargic, thin young woman HEENT:  dry mucus membranes Lungs:    breathing non labored, no wheeze on room air CV:         tachycardic, regular Abd:       Soft, diffusely tender, +bowel sounds Ext:    No edema Skin:      Warm and dry; no rashes Neuro:   not sedated, opens eyes to name but not following commands or cooperative with exam.    Resolved problem list   Assessment and Plan  AGMA due to DKA IDDM (insulin  pump, poorly controlled) Insulin  pump (?malfunction)  Noncompliance  Admit to ICU DKA protocol. Needs central line placement for IV access. Serial labs NPO  AKI HyperK due to acidosis Pseudohyponatremia due to hyperglycemia  IVF Serial labs Avoid nephrotoxins  I/O chart  Metabolic encephalopathy due to DKA Monitor Avoid opioids/sedatives/hypnotics  Fe def anemia PO Fe when able   Vit D def Replace  Lactic acidosis due to dehydration Serial LA IVF  Depression Resume home meds when appropriate  Best Practice (right click and Reselect all SmartList Selections daily)   Diet/type: NPO DVT prophylaxis LMWH Pressure ulcer(s): N/A GI prophylaxis: H2B Lines: N/A Foley:  N/A Code Status:  full code Last date of multidisciplinary goals of care discussion [multiple attempts this morning to to contact family - have not been able to reach anyone yet. Will continue to try later today.]  The patient is critically ill due to DKA, encephalopathy, acidosis.  Critical care was necessary to treat or prevent imminent or life-threatening deterioration.  Critical care was time spent personally by  me on the following activities: development of treatment plan with patient and/or surrogate as well as nursing, discussions with consultants, evaluation of patient's response to treatment, examination of patient, obtaining history from patient or surrogate, ordering and performing treatments and interventions, ordering and review of laboratory studies, ordering and review of radiographic studies, pulse oximetry, re-evaluation of patient's condition and participation in multidisciplinary rounds.   Critical Care Time devoted to patient  care services described in this note is 45 minutes. This time reflects time of care of this signee Dawn [name] . This critical care time does not reflect separately billable procedures or procedure time, teaching time or supervisory time of PA/NP/Med student/Med Resident etc but could involve care discussion time.       Verdon GORMAN Gore [facility] 08/02/2023 9:43 AM  Pager: see AMION  If no response to pager , please call critical care on call (see AMION) until 7pm After 7:00 pm call Elink     Labs   CBC: Recent Labs  Lab 07/28/23 2012 08/01/23 1916 08/01/23 2017 08/02/23 0325  WBC 9.3 21.2*  --  16.7*  NEUTROABS 6.7  --   --   --   HGB 9.9* 11.8* 15.0 10.9*  HCT 33.2* 41.4 44.0 38.3  MCV 83.8 89.4  --  88.0  PLT 389 530*  --  366    Basic Metabolic Panel: Recent Labs  Lab 07/28/23 2012 08/01/23 1937 08/01/23 2017 08/02/23 0127 08/02/23 0325 08/02/23 0802  NA 138 125* 125* 140 138 141  K 4.6 7.3* 7.2* 4.5 5.4* 5.0  CL 104 91* 103 107 107 114*  CO2 23 <7*  --  10* 8* 10*  GLUCOSE 160* 836* >700* 238* 385* 210*  BUN 26* 38* 35* 28* 26* 23*  CREATININE 0.66 1.83* 1.30* 1.23* 1.20* 1.23*  CALCIUM  9.2 8.9  --  9.3 8.9 9.2  MG 2.7*  --   --   --   --   --    GFR: Estimated Creatinine Clearance: 57.2 mL/min (A) (by C-G formula based on SCr of 1.23 mg/dL (H)). Recent Labs  Lab 07/28/23 2012 08/01/23 1916 08/01/23 2016 08/01/23 2248 08/02/23 0325  WBC 9.3 21.2*  --   --  16.7*  LATICACIDVEN  --   --  2.4* 4.6* 2.4*    Liver Function Tests: Recent Labs  Lab 07/28/23 2012 08/01/23 1937  AST 21 29  ALT 16 24  ALKPHOS 125 180*  BILITOT 0.6 2.4*  PROT 8.0 8.6*  ALBUMIN 3.6 3.8   No results for input(s): LIPASE, AMYLASE in the last 168 hours. No results for input(s): AMMONIA in the last 168 hours.  ABG    Component Value Date/Time   PHART 6.99 (LL) 08/01/2023 2115   PCO2ART 22 (L) 08/01/2023 2115   PO2ART 34 (LL)  08/01/2023 2115   HCO3 5.2 (L) 08/01/2023 2115   TCO2 6 (L) 08/01/2023 2017   ACIDBASEDEF 25.1 (H) 08/01/2023 2115   O2SAT 46.4 08/01/2023 2115     Coagulation Profile: No results for input(s): INR, PROTIME in the last 168 hours.  Cardiac Enzymes: No results for input(s): CKTOTAL, CKMB, CKMBINDEX, TROPONINI in the last 168 hours.  HbA1C: HbA1c, POC (controlled diabetic range)  Date/Time Value Ref Range Status  12/12/2022 04:03 PM 9.5 (A) 0.0 - 7.0 % Final  10/27/2022 12:00 PM 11.7 (A) 0.0 - 7.0 % Final   Hgb A1c MFr Bld  Date/Time Value Ref Range Status  07/19/2023 05:39  AM 11.7 (H) 4.8 - 5.6 % Final    Comment:    (NOTE) Diagnosis of Diabetes The following HbA1c ranges recommended by the American Diabetes Association (ADA) may be used as an aid in the diagnosis of diabetes mellitus.  Hemoglobin             Suggested A1C NGSP%              Diagnosis  <5.7                   Non Diabetic  5.7-6.4                Pre-Diabetic  >6.4                   Diabetic  <7.0                   Glycemic control for                       adults with diabetes.    04/09/2023 06:44 PM 11.4 (H) 4.8 - 5.6 % Final    Comment:    (NOTE)         Prediabetes: 5.7 - 6.4         Diabetes: >6.4         Glycemic control for adults with diabetes: <7.0     CBG: Recent Labs  Lab 08/02/23 0342 08/02/23 0442 08/02/23 0528 08/02/23 0641 08/02/23 0744  GLUCAP 338* 412* 389* 271* 165*

## 2023-08-02 NOTE — Progress Notes (Addendum)
 Attempt was made to place midline in RUA, however resistance was met. A 24G PIV was established in right wrist after both arms were examined with US  and no other suitable veins were visualized. It is the recommendation of this VAST RN that a central line be placed. Unit RN at bedside and made aware.

## 2023-08-02 NOTE — Inpatient Diabetes Management (Signed)
 Inpatient Diabetes Program Recommendations  AACE/ADA: New Consensus Statement on Inpatient Glycemic Control (2015)  Target Ranges:  Prepandial:   less than 140 mg/dL      Peak postprandial:   less than 180 mg/dL (1-2 hours)      Critically ill patients:  140 - 180 mg/dL   Lab Results  Component Value Date   GLUCAP 140 (H) 08/02/2023   HGBA1C 11.7 (H) 07/19/2023    Review of Glycemic Control  Diabetes history: DM1 Outpatient Diabetes medications: OmniPod Insulin  pump, if pump is off, [name]  22 daily and Novolog  4 units TID Current orders for Inpatient glycemic control: IV insulin  per Endo[facility] for DKA  HgbA1C - 11.7%  Inpatient Diabetes Program Recommendations:    Continue IV insulin  drip  Pt encephalopathic, unable to answer questions. Will see when appropriate.   Continue to follow.  Thank you. Shona Brandy, RD, LDN, CDCES Inpatient Diabetes Coordinator [phone]

## 2023-08-02 NOTE — Progress Notes (Signed)
 eLink Physician-Brief Progress Note Patient Name: Dawn [name] DOB: July 31, 2000 MRN: [id number]   Date of Service  08/02/2023  HPI/Events of Note  23 year old woman with a history of poorly controlled type 1 diabetes mellitus. She has had over numerous admissions in the last 12 months or ED visits related to her diabetes. Presents with DKA   Labs pending, per last bicarb 19, gap resolved.  Patient is laying on her encephalopathic, requesting water  and food, tolerated ice chips during the daytime Angry that she had a femoral catheter placed, angry that she had central line attempts in the neck  eICU Interventions  Provider conversation with regards to the emergent line when physical provider is available in the morning.  Emergent consent was implied  Transition from IV insulin  to subcutaneous insulin , advance diet   0031 - complaining groin pain at CVC site and menstrual cramps; add ibuprofen , heating pads per nursing interventions  Intervention Category Intermediate Interventions: Hyperglycemia - evaluation and treatment  Jacolby Risby 08/02/2023, 8:40 PM

## 2023-08-02 NOTE — Plan of Care (Signed)

## 2023-08-02 NOTE — Procedures (Signed)
 Central Venous Catheter Insertion Procedure Note  Dawn [name]  [id number]  10-24-2000  Date:08/02/23  Time:9:20 AM   Provider Performing:Luisangel Wainright GORMAN Gore   Procedure: Insertion of Non-tunneled Central Venous Catheter(36556) with US  guidance (23062)   Indication(s) Medication administration and Difficult access  Consent Unable to obtain consent due to emergent nature of procedure.  Anesthesia Topical only with 1% lidocaine    Timeout Verified patient identification, verified procedure, site/side was marked, verified correct patient position, special equipment/implants available, medications/allergies/relevant history reviewed, required imaging and test results available.  Sterile Technique Maximal sterile technique including full sterile barrier drape, hand hygiene, sterile gown, sterile gloves, mask, hair covering, sterile ultrasound probe cover (if used).  Procedure Description Area of catheter insertion was cleaned with chlorhexidine  and draped in sterile fashion.  With real-time ultrasound guidance a central venous catheter was placed into the right femoral vein. Nonpulsatile blood flow and easy flushing noted in all ports.  The catheter was sutured in place and sterile dressing applied. Prior to successful femoral placement a right internal jugular CVC was attempted and not successful due to highly collapsible vessel.  Complications/Tolerance None; patient tolerated the procedure well. Chest X-ray is ordered to rule out pneumothorax after right internal jugular CVC attempt EBL Minimal  Specimen(s) None

## 2023-08-03 ENCOUNTER — Encounter (HOSPITAL_COMMUNITY): Payer: Self-pay | Admitting: Pulmonary Disease

## 2023-08-03 DIAGNOSIS — N179 Acute kidney failure, unspecified: Secondary | ICD-10-CM | POA: Diagnosis not present

## 2023-08-03 DIAGNOSIS — E875 Hyperkalemia: Secondary | ICD-10-CM | POA: Diagnosis not present

## 2023-08-03 DIAGNOSIS — E1010 Type 1 diabetes mellitus with ketoacidosis without coma: Secondary | ICD-10-CM | POA: Diagnosis not present

## 2023-08-03 LAB — GLUCOSE, CAPILLARY
Glucose-Capillary: 59 mg/dL — ABNORMAL LOW (ref 70–99)
Glucose-Capillary: 80 mg/dL (ref 70–99)
Glucose-Capillary: 166 mg/dL — ABNORMAL HIGH (ref 70–99)
Glucose-Capillary: 140 mg/dL — ABNORMAL HIGH (ref 70–99)
Glucose-Capillary: 280 mg/dL — ABNORMAL HIGH (ref 70–99)
Glucose-Capillary: 137 mg/dL — ABNORMAL HIGH (ref 70–99)

## 2023-08-03 LAB — BASIC METABOLIC PANEL WITH GFR
Anion gap: 6 (ref 5–15)
Anion gap: 9 (ref 5–15)
BUN: 10 mg/dL (ref 6–20)
BUN: 10 mg/dL (ref 6–20)
CO2: 21 mmol/L — ABNORMAL LOW (ref 22–32)
CO2: 21 mmol/L — ABNORMAL LOW (ref 22–32)
Calcium: 8.3 mg/dL — ABNORMAL LOW (ref 8.9–10.3)
Calcium: 8.9 mg/dL (ref 8.9–10.3)
Chloride: 112 mmol/L — ABNORMAL HIGH (ref 98–111)
Chloride: 113 mmol/L — ABNORMAL HIGH (ref 98–111)
Creatinine, Ser: 0.92 mg/dL (ref 0.44–1.00)
Creatinine, Ser: 0.93 mg/dL (ref 0.44–1.00)
GFR, Estimated: >60 mL/min (ref >60)
GFR, Estimated: >60 mL/min (ref >60)
Glucose, Bld: 122 mg/dL — ABNORMAL HIGH (ref 70–99)
Glucose, Bld: 169 mg/dL — ABNORMAL HIGH (ref 70–99)
Potassium: 3.2 mmol/L — ABNORMAL LOW (ref 3.5–5.1)
Potassium: 3.9 mmol/L (ref 3.5–5.1)
Sodium: 139 mmol/L (ref 135–145)
Sodium: 143 mmol/L (ref 135–145)

## 2023-08-03 LAB — BETA-HYDROXYBUTYRIC ACID: Beta-Hydroxybutyric Acid: 0.18 mmol/L (ref 0.05–0.27)

## 2023-08-03 LAB — HIV ANTIBODY (ROUTINE TESTING W REFLEX): HIV Screen 4th Generation wRfx: NONREACTIVE

## 2023-08-03 MED ORDER — IBUPROFEN 200 MG PO TABS
600.0000 mg | ORAL_TABLET | Freq: Four times a day (QID) | ORAL | Status: DC | PRN
  Administered 2023-08-03 – 2023-08-04 (×3): 600 mg via ORAL
  Filled 2023-08-03 (×3): qty 3

## 2023-08-03 MED ORDER — POTASSIUM CHLORIDE 20 MEQ PO PACK
40.0000 meq | PACK | Freq: Once | ORAL | Status: AC
  Administered 2023-08-03: 40 meq via ORAL
  Filled 2023-08-03: qty 2

## 2023-08-03 NOTE — Plan of Care (Signed)
  Problem: Education: Goal: Knowledge of General Education information will improve Description: Including pain rating scale, medication(s)/side effects and non-pharmacologic comfort measures Outcome: Progressing   Problem: Health Behavior/Discharge Planning: Goal: Ability to manage health-related needs will improve Outcome: Progressing   Problem: Clinical Measurements: Goal: Ability to maintain clinical measurements within normal limits will improve Outcome: Progressing Goal: Will remain free from infection Outcome: Progressing Goal: Diagnostic test results will improve Outcome: Progressing Goal: Respiratory complications will improve Outcome: Progressing Goal: Cardiovascular complication will be avoided Outcome: Progressing   Problem: Activity: Goal: Risk for activity intolerance will decrease Outcome: Progressing   Problem: Nutrition: Goal: Adequate nutrition will be maintained Outcome: Progressing   Problem: Coping: Goal: Level of anxiety will decrease Outcome: Progressing   Problem: Elimination: Goal: Will not experience complications related to bowel motility Outcome: Progressing Goal: Will not experience complications related to urinary retention Outcome: Progressing   Problem: Pain Managment: Goal: General experience of comfort will improve and/or be controlled Outcome: Progressing   Problem: Safety: Goal: Ability to remain free from injury will improve Outcome: Progressing   Problem: Skin Integrity: Goal: Risk for impaired skin integrity will decrease Outcome: Progressing   Cindy S. Loreli BSN, RN, CCRP, CCRN 08/03/2023 4:22 AM

## 2023-08-03 NOTE — Consult Note (Signed)
 Chart reviewed:  Please ensure 1:1 safety sitter, and she can be restarted on her home Lexapro  dose. Last recorded at 20 mg, but if she is not been taking it, can start on 10 mg.  Psychiatry will evaluate need for inpatient psychiatric admission once she is medically cleared.

## 2023-08-03 NOTE — Progress Notes (Signed)
 Notified by DM coordinator that pt voiced to her she did not want to be on [facility] anymore.    Spoke with pt.  She is tearful. Does voice current thoughts of SI but no plan.  Reports previous SI attempt when she was younger, with a shoestring and was hospitalized.  States she does not have any social support, lives couch to cough.  Needs assistance with meds and PCP as well  - will place pt on SI precautions with sitter 1:1 - psych consulted - [facility] consulted for assistance with meds, PCP, and living conditions - ongoing emotional support ongoing      Lyle Pesa, MSN, AG-ACNP-BC [facility] 08/03/2023, 1:16 PM  See Amion for pager If no response to pager , please call [phone] until 7pm After 7:00 pm call Elink  [phone]

## 2023-08-03 NOTE — Inpatient Diabetes Management (Addendum)
 Inpatient Diabetes Program Recommendations  AACE/ADA: New Consensus Statement on Inpatient Glycemic Control (2015)  Target Ranges:  Prepandial:   less than 140 mg/dL      Peak postprandial:   less than 180 mg/dL (1-2 hours)      Critically ill patients:  140 - 180 mg/dL   Lab Results  Component Value Date   GLUCAP 166 (H) 08/03/2023   HGBA1C 11.7 (H) 07/19/2023    Review of Glycemic Control  Diabetes history: DM1 Outpatient Diabetes medications: [name]  22 daily, Novolog  4 TID Current orders for Inpatient glycemic control: Semglee  20 Q24H, Novolog  0-15 TID with meals and 0-5 HS  Inpatient Diabetes Program Recommendations:    Will need meal coverage insulin  at some point when eating > 50%. (Type 1 and makes no insulin ))  TOC consult for assistance with obtaining meds. Pt states her insurance has run out. Also does not have a PCP.  Spoke with pt at bedside regarding her diabetes, DKA admission and importance of controlling her blood sugars. Pt states I don't want to be here. This Coordinator said I know you don't want to be here in the hospital, in ICU. Pt states as she is crying, No, I don't want to be here on this Earth. We talked about being overwhelmed with diabetes, stating, I don't have any help, I do everything for myself, there's nobody that helps me at all. Inquired about family, who she's living with, why she didn't have insulin  prior to admission. Pt states she lives with Aunt right now, estranged from mother and siblings, also stays with another Wylie and has stayed in a motel. Pt said since I've been here, nobody has come to see me, they don't care. Prior to this conversation, pt said she does not have PCP and her insurance stopped paying for insulin . This is why she went into DKA. Has old insulin  pump but does not use it because it will not sync with her phone. Does not know who prescribed the pump. Said she's off her depression meds and feels hopeless and helpless. She  repeated that she does not want to be here on this Earth. Tried to comfort pt and told her that we care about her and will get her the help she needs. I asked if I could call her Aunt and she said I don't care, I just don't care. Discussed above with NP and RNs.  Continue to follow.   Thank you. Shona Brandy, RD, LDN, CDCES Inpatient Diabetes Coordinator [phone]  Addendum: Spoke with pt again at bedside. Was late for Endo appt on 06/16/23 (Dr Jacqueline with [facility]) and did not get to see her. Office asked her to reschedule the appt. Pt said she would reach out to Endo office while here in hospital.  Instructed pt to NOT RESTART insulin  pump until she sees Endocrinologist.  Will be discharged home on [name]  and Novolog . Also needs prescription for insulin  pen needles. Pt will need more training on insulin  pump from Endo office.  Pt seems to feel a little better. Continues to say she has no one to help her with anything, it's all on her.  RV

## 2023-08-03 NOTE — Progress Notes (Addendum)
 NAMECassandria [name], MRN:  [id number], DOB:  [birth year], LOS: 2 ADMISSION DATE:  08/01/2023, CONSULTATION DATE: 07/19/2023 REFERRING MD: ED MD, CHIEF COMPLAINT:  Nausea and vomiting for one day   History of Present Illness:  A 23 yr old female patient with IDDM (insulin  pump, poorly controlled, last HbA1c 11.7% 2 weeks ago), Fe def anemia, depression, and vitamin D  deficiency with multiple admissions due to DKA who presents to ED with N/V today. She has been out of her diabetic medications and noted hyperglycemia x 2 days.  According to EMS blood sugar is 534 mg/dl in the ambulance. She has been placed on multiple medications, including transition to subcu insulin ; however, she has not been adherent to these medications. Started on DKA protocol in ED. Hx is limited due to lethargy.   Pertinent  Medical History  IDDM (insulin  pump), Fe def anemia, vitamin D  deficiency, depression   Significant Hospital Events: Including procedures, antibiotic start and stop dates in addition to other pertinent events   DKA protocol  Interim History / Subjective:  More awake and alert. Insulin  drip transitioned off overnight. Hypoglycemic this morning. Says she prefers apple juice to the orange juice and sprite she was given. Uses insulin  pump at home but was not compatible with her phone.   Endocrinologist Dr. Goukassian at [facility]. Last seen by an endocrinologist while hospitalized at [facility] for DKA May 2025. At that visit she said she did not want to be on the pump.  Home regimen glargine 20 units at bedtime was recommended with short acting.    Objective    Blood pressure 112/76, pulse 92, temperature 98.3 F (36.8 C), temperature source Oral, resp. rate 17, height 5' 3 (1.6 m), weight 55.5 kg, SpO2 100%.        Intake/Output Summary (Last 24 hours) at 08/03/2023 0833 Last data filed at 08/03/2023 0500 Gross per 24 hour  Intake 2810.54 ml  Output 1850 ml  Net 960.54 ml    Filed Weights   08/02/23  0000 08/02/23 0100 08/03/23 0505  Weight: 50.5 kg 50.5 kg 55.5 kg    Examination: Thin woman, no distress Awake, alert, oriented Normal speech no focal asymmetry Breathing non labored on room air Tachycardic, regular Improved skin turgor Right groin central line in place.   Labs reviewed AG resolved CBGs improved (hypoglycemic this morning)  Cr improved   Resolved problem list   AKI HyperK due to acidosis Pseudohyponatremia due to hyperglycemia  Metabolic encephalopathy due to DKA Lactic acidosis due to dehydration Assessment and Plan  AGMA due to DKA IDDM (insulin  pump, poorly controlled) Non adherence to medication Can transition to daily labs Transitioned off insulin  gtt Carb controlled diet Need to monitor her CBGs while on 20 units basal plus short acting and carb coverage Will get out her central line once an IV can be placed.   Hypokalemia - replace  Fe def anemia Oral iron  as outpatient Probably near baseline Hgb around 9-10   Vit D deficiency Replace as outpatient  Depression Prescribed lexapro , says she is not taking  Best Practice (right click and Reselect all SmartList Selections daily)   Diet/type: NPO DVT prophylaxis LMWH Pressure ulcer(s): N/A GI prophylaxis: H2B Lines: N/A Foley:  N/A Code Status:  full code Last date of multidisciplinary goals of care discussion [patient updated at bedside]  Likely discharge home 8/1.  Verdon GORMAN [name] 08/03/2023 8:33 AM  Pager: see AMION  If no response to  pager , please call critical care on call (see AMION) until 7pm After 7:00 pm call Elink     Labs   CBC: Recent Labs  Lab 07/28/23 2012 08/01/23 1916 08/01/23 2017 08/02/23 0325  WBC 9.3 21.2*  --  16.7*  NEUTROABS 6.7  --   --   --   HGB 9.9* 11.8* 15.0 10.9*  HCT 33.2* 41.4 44.0 38.3  MCV 83.8 89.4  --  88.0  PLT 389 530*  --  366    Basic Metabolic Panel: Recent Labs  Lab  07/28/23 2012 08/01/23 1937 08/02/23 1233 08/02/23 1607 08/02/23 1956 08/03/23 0008 08/03/23 0456  NA 138   < > 144 143 142 143 139  K 4.6   < > 4.2 3.7 3.6 3.9 3.2*  CL 104   < > 116* 117* 112* 113* 112*  CO2 23   < > 14* 19* 20* 21* 21*  GLUCOSE 160*   < > 152* 143* 204* 169* 122*  BUN 26*   < > 18 14 13 10 10   CREATININE 0.66   < > 1.07* 0.91 0.90 0.93 0.92  CALCIUM  9.2   < > 8.8* 8.5* 8.8* 8.9 8.3*  MG 2.7*  --   --   --   --   --   --    < > = values in this interval not displayed.   GFR: Estimated Creatinine Clearance: 79.3 mL/min (by C-G formula based on SCr of 0.92 mg/dL). Recent Labs  Lab 07/28/23 2012 08/01/23 1916 08/01/23 2016 08/01/23 2248 08/02/23 0325  WBC 9.3 21.2*  --   --  16.7*  LATICACIDVEN  --   --  2.4* 4.6* 2.4*    Liver Function Tests: Recent Labs  Lab 07/28/23 2012 08/01/23 1937  AST 21 29  ALT 16 24  ALKPHOS 125 180*  BILITOT 0.6 2.4*  PROT 8.0 8.6*  ALBUMIN 3.6 3.8   No results for input(s): LIPASE, AMYLASE in the last 168 hours. No results for input(s): AMMONIA in the last 168 hours.  ABG    Component Value Date/Time   PHART 6.99 (LL) 08/01/2023 2115   PCO2ART 22 (L) 08/01/2023 2115   PO2ART 34 (LL) 08/01/2023 2115   HCO3 5.2 (L) 08/01/2023 2115   TCO2 6 (L) 08/01/2023 2017   ACIDBASEDEF 25.1 (H) 08/01/2023 2115   O2SAT 46.4 08/01/2023 2115     Coagulation Profile: No results for input(s): INR, PROTIME in the last 168 hours.  Cardiac Enzymes: No results for input(s): CKTOTAL, CKMB, CKMBINDEX, TROPONINI in the last 168 hours.  HbA1C: HbA1c, POC (controlled diabetic range)  Date/Time Value Ref Range Status  12/12/2022 04:03 PM 9.5 (A) 0.0 - 7.0 % Final  10/27/2022 12:00 PM 11.7 (A) 0.0 - 7.0 % Final   Hgb A1c MFr Bld  Date/Time Value Ref Range Status  07/19/2023 05:39 AM 11.7 (H) 4.8 - 5.6 % Final    Comment:    (NOTE) Diagnosis of Diabetes The following HbA1c ranges recommended by the  American Diabetes Association (ADA) may be used as an aid in the diagnosis of diabetes mellitus.  Hemoglobin             Suggested A1C NGSP%              Diagnosis  <5.7                   Non Diabetic  5.7-6.4  Pre-Diabetic  >6.4                   Diabetic  <7.0                   Glycemic control for                       adults with diabetes.    04/09/2023 06:44 PM 11.4 (H) 4.8 - 5.6 % Final    Comment:    (NOTE)         Prediabetes: 5.7 - 6.4         Diabetes: >6.4         Glycemic control for adults with diabetes: <7.0     CBG: Recent Labs  Lab 08/02/23 2134 08/02/23 2240 08/02/23 2340 08/03/23 0737 08/03/23 0819  GLUCAP 172* 143* 142* 59* 80

## 2023-08-04 ENCOUNTER — Inpatient Hospital Stay (HOSPITAL_COMMUNITY)
Admission: AD | Admit: 2023-08-04 | Discharge: 2023-08-06 | DRG: 885 | Disposition: A | Discharge status: Home / Self Care | Payer: MEDICAID | Source: Intra-hospital | Attending: Psychiatry | Admitting: Psychiatry

## 2023-08-04 DIAGNOSIS — Z91048 Other nonmedicinal substance allergy status: Secondary | ICD-10-CM | POA: Diagnosis not present

## 2023-08-04 DIAGNOSIS — Z91040 Latex allergy status: Secondary | ICD-10-CM | POA: Diagnosis not present

## 2023-08-04 DIAGNOSIS — Z8249 Family history of ischemic heart disease and other diseases of the circulatory system: Secondary | ICD-10-CM | POA: Diagnosis not present

## 2023-08-04 DIAGNOSIS — E1065 Type 1 diabetes mellitus with hyperglycemia: Secondary | ICD-10-CM | POA: Diagnosis present

## 2023-08-04 DIAGNOSIS — R45851 Suicidal ideations: Secondary | ICD-10-CM

## 2023-08-04 DIAGNOSIS — Z825 Family history of asthma and other chronic lower respiratory diseases: Secondary | ICD-10-CM

## 2023-08-04 DIAGNOSIS — Z91013 Allergy to seafood: Secondary | ICD-10-CM | POA: Diagnosis not present

## 2023-08-04 DIAGNOSIS — Z833 Family history of diabetes mellitus: Secondary | ICD-10-CM | POA: Diagnosis not present

## 2023-08-04 DIAGNOSIS — F332 Major depressive disorder, recurrent severe without psychotic features: Principal | ICD-10-CM | POA: Diagnosis present

## 2023-08-04 DIAGNOSIS — Z794 Long term (current) use of insulin: Secondary | ICD-10-CM | POA: Diagnosis not present

## 2023-08-04 DIAGNOSIS — F909 Attention-deficit hyperactivity disorder, unspecified type: Secondary | ICD-10-CM | POA: Diagnosis present

## 2023-08-04 DIAGNOSIS — E1010 Type 1 diabetes mellitus with ketoacidosis without coma: Secondary | ICD-10-CM | POA: Diagnosis not present

## 2023-08-04 DIAGNOSIS — Z79899 Other long term (current) drug therapy: Secondary | ICD-10-CM

## 2023-08-04 DIAGNOSIS — Z5986 Financial insecurity: Secondary | ICD-10-CM

## 2023-08-04 LAB — GLUCOSE, CAPILLARY
Glucose-Capillary: 144 mg/dL — ABNORMAL HIGH (ref 70–99)
Glucose-Capillary: 74 mg/dL (ref 70–99)
Glucose-Capillary: 248 mg/dL — ABNORMAL HIGH (ref 70–99)
Glucose-Capillary: 260 mg/dL — ABNORMAL HIGH (ref 70–99)

## 2023-08-04 LAB — BLOOD GAS, ARTERIAL
Drawn by: 51133
O2 Saturation: 46.4
O2 Saturation: 46.4 mmol/L — AB (ref 20.0–28.0)
Patient temperature: 36.6 mmol/L — AB (ref 0.0–2.0)
Patient temperature: 51133
pCO2 arterial: 22 mmHg — AB (ref 32–48)
pCO2 arterial: 22 mmol/L — ABNORMAL LOW (ref 32–48)
pH, Arterial: 21 — AB (ref 7.35–7.45)
pO2, Arterial: 34 mmHg — CL (ref 83–7.45)
pO2, Arterial: 34 mmol/L — CL (ref 83–2.0)

## 2023-08-04 LAB — BASIC METABOLIC PANEL WITH GFR
Anion gap: 8 (ref 5–15)
BUN: 9 mg/dL (ref 6–20)
CO2: 19 mmol/L — ABNORMAL LOW (ref 22–32)
Calcium: 8.7 mg/dL — ABNORMAL LOW (ref 8.9–10.3)
Chloride: 114 mmol/L — ABNORMAL HIGH (ref 98–111)
Creatinine, Ser: 0.79 mg/dL (ref 0.44–1.00)
GFR, Estimated: >60 mL/min (ref >60)
Glucose, Bld: 95 mg/dL (ref 70–99)
Potassium: 4 mmol/L (ref 3.5–5.1)
Sodium: 141 mmol/L (ref 135–145)

## 2023-08-04 LAB — RPR: RPR Ser Ql: NONREACTIVE

## 2023-08-04 MED ORDER — INSULIN ASPART 100 UNIT/ML IJ SOLN
0.0000 [IU] | Freq: Three times a day (TID) | INTRAMUSCULAR | Status: DC
  Administered 2023-08-05: 5 [IU] via SUBCUTANEOUS

## 2023-08-04 MED ORDER — ALUM & MAG HYDROXIDE-SIMETH 200-200-20 MG/5ML PO SUSP: 30.0000 mL | ORAL | Status: DC | PRN

## 2023-08-04 MED ORDER — DOCUSATE SODIUM 100 MG PO CAPS: 100.0000 mg | ORAL_CAPSULE | Freq: Two times a day (BID) | ORAL | Status: DC | PRN

## 2023-08-04 MED ORDER — INSULIN ASPART 100 UNIT/ML IJ SOLN
4.0000 [IU] | Freq: Three times a day (TID) | INTRAMUSCULAR | Status: DC
  Administered 2023-08-05 – 2023-08-06 (×5): 4 [IU] via SUBCUTANEOUS

## 2023-08-04 MED ORDER — LORAZEPAM 2 MG/ML IJ SOLN: 2.0000 mg | Freq: Three times a day (TID) | INTRAMUSCULAR | Status: DC | PRN

## 2023-08-04 MED ORDER — INSULIN ASPART 100 UNIT/ML IJ SOLN
4.0000 [IU] | Freq: Three times a day (TID) | INTRAMUSCULAR | Status: DC
  Administered 2023-08-04: 4 [IU] via SUBCUTANEOUS

## 2023-08-04 MED ORDER — FAMOTIDINE 20 MG PO TABS: 20.0000 mg | ORAL_TABLET | Freq: Every day | ORAL | Status: DC

## 2023-08-04 MED ORDER — HALOPERIDOL LACTATE 5 MG/ML IJ SOLN
5.0000 mg | Freq: Three times a day (TID) | INTRAMUSCULAR | Status: DC | PRN
Start: 2023-08-04 — End: 2023-08-04

## 2023-08-04 MED ORDER — INSULIN ASPART 100 UNIT/ML IJ SOLN
0.0000 [IU] | Freq: Every day | INTRAMUSCULAR | Status: DC
  Administered 2023-08-04: 3 [IU] via SUBCUTANEOUS

## 2023-08-04 MED ORDER — INSULIN ASPART 100 UNIT/ML IJ SOLN: 4.0000 [IU] | Freq: Three times a day (TID) | INTRAMUSCULAR | Status: DC

## 2023-08-04 MED ORDER — DIPHENHYDRAMINE HCL 50 MG/ML IJ SOLN: 50.0000 mg | Freq: Three times a day (TID) | INTRAMUSCULAR | Status: DC | PRN

## 2023-08-04 MED ORDER — CLONIDINE HCL 0.1 MG PO TABS: 0.1000 mg | ORAL_TABLET | Freq: Two times a day (BID) | ORAL | Status: DC | PRN

## 2023-08-04 MED ORDER — ACETAMINOPHEN 325 MG PO TABS: 650.0000 mg | ORAL_TABLET | Freq: Four times a day (QID) | ORAL | Status: DC | PRN

## 2023-08-04 MED ORDER — TRAZODONE HCL 50 MG PO TABS: 50.0000 mg | ORAL_TABLET | Freq: Every evening | ORAL | Status: DC | PRN

## 2023-08-04 MED ORDER — HALOPERIDOL LACTATE 5 MG/ML IJ SOLN: 10.0000 mg | Freq: Three times a day (TID) | INTRAMUSCULAR | Status: DC | PRN

## 2023-08-04 MED ORDER — INSULIN GLARGINE-YFGN 100 UNIT/ML ~~LOC~~ SOLN: 20.0000 [IU] | SUBCUTANEOUS | Status: DC

## 2023-08-04 MED ORDER — HALOPERIDOL 5 MG PO TABS: 5.0000 mg | ORAL_TABLET | Freq: Three times a day (TID) | ORAL | Status: DC | PRN

## 2023-08-04 MED ORDER — INSULIN GLARGINE-YFGN 100 UNIT/ML ~~LOC~~ SOLN
20.0000 [IU] | SUBCUTANEOUS | Status: DC
  Administered 2023-08-04 – 2023-08-05 (×2): 20 [IU] via SUBCUTANEOUS

## 2023-08-04 MED ORDER — MAGNESIUM HYDROXIDE 400 MG/5ML PO SUSP: 30.0000 mL | Freq: Every day | ORAL | Status: DC | PRN

## 2023-08-04 MED ORDER — FAMOTIDINE 20 MG PO TABS
20.0000 mg | ORAL_TABLET | Freq: Every day | ORAL | Status: DC
Start: 2023-08-04 — End: 2023-08-06
  Administered 2023-08-05: 20 mg via ORAL
  Filled 2023-08-04: qty 1

## 2023-08-04 MED ORDER — DIPHENHYDRAMINE HCL 50 MG/ML IJ SOLN
50.0000 mg | Freq: Three times a day (TID) | INTRAMUSCULAR | Status: DC | PRN
Start: 2023-08-04 — End: 2023-08-04

## 2023-08-04 MED ORDER — ONDANSETRON HCL 4 MG/2ML IJ SOLN: 4.0000 mg | Freq: Four times a day (QID) | INTRAMUSCULAR | Status: DC | PRN

## 2023-08-04 MED ORDER — HALOPERIDOL LACTATE 5 MG/ML IJ SOLN: 5.0000 mg | Freq: Three times a day (TID) | INTRAMUSCULAR | Status: DC | PRN

## 2023-08-04 MED ORDER — INSULIN ASPART 100 UNIT/ML IJ SOLN: 0.0000 [IU] | Freq: Every day | INTRAMUSCULAR | Status: DC

## 2023-08-04 MED ORDER — HYDROXYZINE HCL 25 MG PO TABS: 25.0000 mg | ORAL_TABLET | Freq: Three times a day (TID) | ORAL | Status: DC | PRN

## 2023-08-04 MED ORDER — DIPHENHYDRAMINE HCL 25 MG PO CAPS: 50.0000 mg | ORAL_CAPSULE | Freq: Three times a day (TID) | ORAL | Status: DC | PRN

## 2023-08-04 MED ORDER — INSULIN ASPART 100 UNIT/ML IJ SOLN
0.0000 [IU] | Freq: Three times a day (TID) | INTRAMUSCULAR | Status: DC
  Administered 2023-08-05: 2 [IU] via SUBCUTANEOUS
  Administered 2023-08-05: 3 [IU] via SUBCUTANEOUS
  Administered 2023-08-06: 2 [IU] via SUBCUTANEOUS

## 2023-08-04 MED ORDER — LORAZEPAM 2 MG/ML IJ SOLN
2.0000 mg | Freq: Three times a day (TID) | INTRAMUSCULAR | Status: DC | PRN
Start: 2023-08-04 — End: 2023-08-04

## 2023-08-04 NOTE — Progress Notes (Signed)
 PROGRESS NOTE    Dawn [name]  FMW:[id number] DOB: 2000/01/11 DOA: 08/01/2023 PCP: Pcp, No    Brief Narrative:   Dawn [name] is a 23 y.o. female with past medical history significant for insulin -dependent diabetes mellitus/type I DM (follows with endocrinology outpatient, previously utilize insulin  pump), iron  deficiency anemia, depression, vitamin D  deficiency who presented to [facility] ED on 08/01/2023 from home via EMS with hypoglycemia.  Patient reports being out of diabetic medications over the last 2 days.  Blood sugar 534 upon EMS arrival.  Also reports nausea vomiting.  Denies fever/chills.  Patient with history of multiple admissions due to DKA from medication noncompliance.  She has been placed on multiple medications including transition to subcutaneous insulin ; however she has not been adherent to these medications.  Workup in the ED consistent with diabetic ketoacidosis.  Started on insulin  drip.  History otherwise limited due to lethargy.  PCCM was consulted and patient was admitted to the intensive care unit.  Significant hospital events: 7/29: Admitted to PCCM/ICU, insulin  drip 7/31: Insulin  drip transitioned to subcutaneous insulin , seen by diabetic educator 8/1: Transferred to [facility] service; psychiatry consulted for suicidal ideation, IVC placed; awaiting transfer to inpatient psychiatry  Assessment & Plan:   Diabetic ketoacidosis/AGMA Type 1 diabetes mellitus Pseudohyponatremia due to hyperglycemia: Resolved Metabolic encephalopathy/lactic acidosis secondary to DKA Patient presenting to ED with lethargy, nausea/vomiting and found to have a blood sugar of 534 on EMS arrival.  pH 6.99, PCO222, PO234 on ABG at time of admission.  Etiology likely secondary to medical noncompliance, history of multiple hospitalizations for DKA.  Follows with endocrinology at [facility] health, Dr. Goukassian.  Last seen by endocrinologist while hospitalized at [facility] for DKA May  2025, at that time she was discontinued from her insulin  pump.  Home regimen includes glargine 22 units nightly with NovoLog  4 units 3 times daily AC. --Diabetic educator following, appreciate assistance  -- Hemoglobin A1c 11.7 on 07/19/2023, poorly controlled -- Semglee  20 u [place] daily -- novolog  4u TIDAC (if eating >50% of meals) -- Moderate SSI for coverage -- CBG before every meal/at bedtime -- Consistent carbohydrate diet -- Outpatient follow-up with endocrinology  Patient is medically stable for discharge to inpatient psychiatry once bed available.  Suicidal ideation Depression Prescribed Lexapro , states not utilizing.  While in the ICU, patient voiced thoughts of suicide ideation but no specific plan.  Reports history of previous SI attempt when she was younger, utilizing as true string and was hospitalized.  Reports poor social support outpatient.  Hypokalemia Repleted.  Iron  deficiency anemia Hemoglobin baseline 9-10, stable. -- Ferrous sulfate      DVT prophylaxis: enoxaparin  (LOVENOX ) injection 40 mg Start: 08/01/23 2200 SCDs Start: 08/01/23 2157    Code Status: Full Code Family Communication: No family present at bedside  Disposition Plan:  Level of care: Med-Surg Status is: Inpatient Remains inpatient appropriate because: Medically stable for discharge to inpatient psychiatry once bed available    Consultants:  PCCM: Signed off 8/1 Psychiatry  Procedures:  None  Antimicrobials:  None   Subjective: Patient seen examined bedside, lying in bed.  Sitter present.  No complaints this morning.  Glucose stable.  Tolerating diet.  Seen by psychiatry, placed under IVC.  No other specific complaints, concerns or questions at this time.  Currently denies SI/HI.  Further denies headache, no dizziness, no chest pain, no palpitations, no shortness of breath, no abdominal pain, no fever/chills/night sweats, no nausea/vomiting/diarrhea, no focal weakness, no fatigue, no  paresthesia.  No  acute events overnight per nursing staff.  Medically stable for discharge inpatient psychiatry once bed available  Objective: Vitals:   08/03/23 1200 08/03/23 2031 08/04/23 0618 08/04/23 0700  BP:  119/85 106/72   Pulse: 97 88 83   Resp: 14 18 18    Temp:  98.5 F (36.9 C) 98.5 F (36.9 C)   TempSrc:  Oral Oral   SpO2: 100% 100% 100%   Weight:    55.5 kg  Height:        Intake/Output Summary (Last 24 hours) at 08/04/2023 1315 Last data filed at 08/03/2023 1700 Gross per 24 hour  Intake 240 ml  Output --  Net 240 ml   Filed Weights   08/02/23 0100 08/03/23 0505 08/04/23 0700  Weight: 50.5 kg 55.5 kg 55.5 kg    Examination:  Physical Exam: GEN: NAD, alert and oriented x 3, thin in appearance HEENT: NCAT, PERRL, EOMI, sclera clear, MMM PULM: CTAB w/o wheezes/crackles, normal respiratory effort, on room air CV: RRR w/o M/G/R GI: abd soft, NTND, + BS MSK: no peripheral edema, moves all extremities independently NEURO: CN II-XII intact, no focal deficits, sensation to light touch intact PSYCH: Depressed mood, flat affect, denies SI/HI Integumentary: dry/intact, no rashes or wounds    Data Reviewed: I have personally reviewed following labs and imaging studies  CBC: Recent Labs  Lab 07/28/23 2012 08/01/23 1916 08/01/23 2017 08/02/23 0325  WBC 9.3 21.2*  --  16.7*  NEUTROABS 6.7  --   --   --   HGB 9.9* 11.8* 15.0 10.9*  HCT 33.2* 41.4 44.0 38.3  MCV 83.8 89.4  --  88.0  PLT 389 530*  --  366   Basic Metabolic Panel: Recent Labs  Lab 07/28/23 2012 08/01/23 1937 08/02/23 1607 08/02/23 1956 08/03/23 0008 08/03/23 0456 08/04/23 1104  NA 138   < > 143 142 143 139 141  K 4.6   < > 3.7 3.6 3.9 3.2* 4.0  CL 104   < > 117* 112* 113* 112* 114*  CO2 23   < > 19* 20* 21* 21* 19*  GLUCOSE 160*   < > 143* 204* 169* 122* 95  BUN 26*   < > 14 13 10 10 9   CREATININE 0.66   < > 0.91 0.90 0.93 0.92 0.79  CALCIUM  9.2   < > 8.5* 8.8* 8.9 8.3* 8.7*  MG  2.7*  --   --   --   --   --   --    < > = values in this interval not displayed.   GFR: Estimated Creatinine Clearance: 91.2 mL/min (by C-G formula based on SCr of 0.79 mg/dL). Liver Function Tests: Recent Labs  Lab 07/28/23 2012 08/01/23 1937  AST 21 29  ALT 16 24  ALKPHOS 125 180*  BILITOT 0.6 2.4*  PROT 8.0 8.6*  ALBUMIN 3.6 3.8   No results for input(s): LIPASE, AMYLASE in the last 168 hours. No results for input(s): AMMONIA in the last 168 hours. Coagulation Profile: No results for input(s): INR, PROTIME in the last 168 hours. Cardiac Enzymes: No results for input(s): CKTOTAL, CKMB, CKMBINDEX, TROPONINI in the last 168 hours. BNP (last 3 results) No results for input(s): PROBNP in the last 8760 hours. HbA1C: No results for input(s): HGBA1C in the last 72 hours. CBG: Recent Labs  Lab 08/03/23 1357 08/03/23 1723 08/03/23 2204 08/04/23 0728 08/04/23 1123  GLUCAP 140* 280* 137* 144* 74   Lipid Profile: No results for input(s): CHOL,  HDL, LDLCALC, TRIG, CHOLHDL, LDLDIRECT in the last 72 hours. Thyroid  Function Tests: No results for input(s): TSH, T4TOTAL, FREET4, T3FREE, THYROIDAB in the last 72 hours. Anemia Panel: No results for input(s): VITAMINB12, FOLATE, FERRITIN, TIBC, IRON , RETICCTPCT in the last 72 hours. Sepsis Labs: Recent Labs  Lab 08/01/23 2016 08/01/23 2248 08/02/23 0325  LATICACIDVEN 2.4* 4.6* 2.4*    Recent Results (from the past 240 hours)  MRSA Next Gen by PCR, Nasal     Status: Abnormal   Collection Time: 08/01/23 10:06 PM   Specimen: Nasal Mucosa; Nasal Swab  Result Value Ref Range Status   MRSA by PCR Next Gen DETECTED (A) NOT DETECTED Final    Comment: (NOTE) The GeneXpert MRSA Assay (FDA approved for NASAL specimens only), is one component of a comprehensive MRSA colonization surveillance program. It is not intended to diagnose MRSA infection nor to guide or monitor treatment  for MRSA infections. Test performance is not FDA approved in patients less than 63 years old. Performed at [facility], [address] [place], [place] [address]          Radiology Studies: No results found.      Scheduled Meds:  Chlorhexidine  Gluconate Cloth  6 each Topical Daily   enoxaparin  (LOVENOX ) injection  40 mg Subcutaneous Q24H   famotidine   20 mg Oral QHS   feeding supplement  237 mL Oral BID BM   insulin  aspart  0-15 Units Subcutaneous TID WC   insulin  aspart  0-5 Units Subcutaneous QHS   insulin  aspart  4 Units Subcutaneous TID WC   insulin  glargine-yfgn  20 Units Subcutaneous Q24H   mupirocin  ointment  1 Application Nasal BID   sodium chloride  flush  10-40 mL Intracatheter Q12H   Continuous Infusions:   LOS: 3 days    Time spent: 52 minutes spent on 08/04/2023 caring for this patient face-to-face including chart review, ordering labs/tests, documenting, discussion with nursing staff, consultants, updating family and interview/physical exam    Camellia PARAS [place], DO [facility] Available via Epic secure chat 7am-7pm After these hours, please refer to coverage provider listed on [email or web address] 08/04/2023, 1:15 PM

## 2023-08-04 NOTE — TOC Progression Note (Addendum)
 Transition of Care [facility]) - Progression Note    Patient Details  Name: Dawn [name] MRN: [id number] Date of Birth: [birth year]  Transition [facility]) CM/SW Contact  Toy LITTIE Agar, RN Phone Number:[phone]  08/04/2023, 11:59 AM  Clinical Narrative:    IVC paperwork has been filed. Magistrate notified. Envelope # [id number]. Please be advised that patient is not to leave currently in the process of IVC Secretary, charge nurse and bedside nurse and nurse tech made aware. Awaiting papers to be served for IVC.   1436 Affidavit and Petition for involuntary commitment has been filed and served to patient Psychologist, sport and exercise [id number]). Form has been uploaded in epic and copies placed on chart.                     Expected Discharge Plan and Services                                               Social Drivers of Health (SDOH) Interventions SDOH Screenings   Food Insecurity: Patient Declined (07/19/2023)  Housing: High Risk (07/19/2023)  Transportation Needs: Patient Declined (07/19/2023)  Utilities: Patient Declined (07/19/2023)  Alcohol Screen: Low Risk  (08/12/2022)  Depression (PHQ2-9): Medium Risk (10/31/2022)  Financial Resource Strain: Low Risk  (06/01/2023)   Received from [facility]  Recent Concern: Financial Resource Strain - High Risk (04/07/2023)   Received from [facility]  Physical Activity: Inactive (04/24/2023)   Received from [facility]  Social Connections: Moderately Isolated (04/24/2023)   Received from [facility]  Stress: Stress Concern Present (04/24/2023)   [facility]  Tobacco Use: Medium Risk (08/03/2023)  Health Literacy: Inadequate Health Literacy (04/24/2023)   Received from [facility]    Readmission Risk Interventions    01/03/2023   11:17 AM 03/07/2022   10:22 AM  Readmission Risk Prevention Plan  Transportation  Screening Complete Complete  PCP or Specialist Appt within 5-7 Days Complete   Home Care Screening Complete   Medication Review (RN CM) Complete   Medication Review (RN Care Manager)  Complete  PCP or Specialist appointment within 3-5 days of discharge  Complete  HRI or Home Care Consult  Complete  SW Recovery Care/Counseling Consult  Complete  Palliative Care Screening  Not Applicable  Skilled Nursing Facility  Not Applicable

## 2023-08-04 NOTE — Progress Notes (Signed)
 Pt refused vital signs to be rechecked. Dr. Raliegh made aware. Skin assessment completed with Celena, MHT and Harley MHT and pt noted to have a scratch to her lower left abdomen.
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
## 2023-08-04 NOTE — [facility] Group Notes (Signed)
 [facility] Group Notes:  (Nursing/MHT/Case Management/Adjunct)  Date:  08/04/2023  Time:  8:38 PM  Type of Therapy:  Wrap-up group  Participation Level:  Did Not Attend  Participation Quality:    Affect:    Cognitive:    Insight:    Engagement in Group:    Modes of Intervention:    Summary of Progress/Problems: Pt didn't attend group.   Grayce LITTIE Essex 08/04/2023, 8:38 PM

## 2023-08-04 NOTE — Plan of Care (Addendum)
  Problem: Education: Goal: Knowledge of General Education information will improve Description: Including pain rating scale, medication(s)/side effects and non-pharmacologic comfort measures 08/04/2023 1609 by Mozqueda Jaramillo, Lennart Res, RN Outcome: Progressing 08/04/2023 1609 by Mozqueda Jaramillo, Lennart Res, RN Outcome: Progressing   Problem: Health Behavior/Discharge Planning: Goal: Ability to manage health-related needs will improve 08/04/2023 1609 by Mozqueda Jaramillo, Lennart Res, RN Outcome: Progressing 08/04/2023 1609 by Mozqueda Jaramillo, Lennart Res, RN Outcome: Progressing   Problem: Clinical Measurements: Goal: Ability to maintain clinical measurements within normal limits will improve 08/04/2023 1609 by Mozqueda Jaramillo, Lennart Res, RN Outcome: Progressing 08/04/2023 1609 by Mozqueda Jaramillo, Dontee Jaso Alondra, RN Outcome: Progressing Goal: Will remain free from infection 08/04/2023 1609 by Mozqueda Jaramillo, Lennart Res, RN Outcome: Progressing 08/04/2023 1609 by Mozqueda Jaramillo, Emeree Mahler Alondra, RN Outcome: Progressing Goal: Diagnostic test results will improve 08/04/2023 1609 by Mozqueda Jaramillo, Genavive Kubicki Alondra, RN Outcome: Progressing 08/04/2023 1609 by Mozqueda Jaramillo, Glendola Friedhoff Alondra, RN Outcome: Progressing Goal: Respiratory complications will improve 08/04/2023 1609 by Mozqueda Jaramillo, Vinton Layson Alondra, RN Outcome: Progressing 08/04/2023 1609 by Mozqueda Jaramillo, Alishia Lebo Alondra, RN Outcome: Progressing Goal: Cardiovascular complication will be avoided 08/04/2023 1609 by Mozqueda Jaramillo, Shyhiem Beeney Alondra, RN Outcome: Progressing 08/04/2023 1609 by Mozqueda Jaramillo, Rustin Erhart Alondra, RN Outcome: Progressing   Problem: Activity: Goal: Risk for activity intolerance will decrease 08/04/2023 1609 by Mozqueda Jaramillo, Atilla Zollner Alondra, RN Outcome: Progressing 08/04/2023 1609 by Mozqueda Jaramillo, Lennart Res, RN Outcome: Progressing    Problem: Nutrition: Goal: Adequate nutrition will be maintained 08/04/2023 1609 by Mozqueda Jaramillo, Braelynn Lupton Alondra, RN Outcome: Progressing 08/04/2023 1609 by Mozqueda Jaramillo, Lennart Res, RN Outcome: Progressing   Problem: Coping: Goal: Level of anxiety will decrease 08/04/2023 1609 by Mozqueda Jaramillo, Jaquasia Doscher Alondra, RN Outcome: Progressing 08/04/2023 1609 by Mozqueda Jaramillo, Lennart Res, RN Outcome: Progressing   Problem: Elimination: Goal: Will not experience complications related to bowel motility 08/04/2023 1609 by Mozqueda Jaramillo, Hania Cerone Alondra, RN Outcome: Progressing 08/04/2023 1609 by Mozqueda Jaramillo, Kendarious Gudino Alondra, RN Outcome: Progressing Goal: Will not experience complications related to urinary retention 08/04/2023 1609 by Mozqueda Jaramillo, Lennart Res, RN Outcome: Progressing 08/04/2023 1609 by Mozqueda Jaramillo, Danika Kluender Alondra, RN Outcome: Progressing   Problem: Pain Managment: Goal: General experience of comfort will improve and/or be controlled 08/04/2023 1609 by Mozqueda Jaramillo, Lennart Res, RN Outcome: Progressing 08/04/2023 1609 by Mozqueda Jaramillo, Arianah Torgeson Alondra, RN Outcome: Progressing   Problem: Safety: Goal: Ability to remain free from injury will improve 08/04/2023 1609 by Mozqueda Jaramillo, Yunus Stoklosa Alondra, RN Outcome: Progressing 08/04/2023 1609 by Mozqueda Jaramillo, Jazziel Fitzsimmons Alondra, RN Outcome: Progressing   Problem: Skin Integrity: Goal: Risk for impaired skin integrity will decrease 08/04/2023 1609 by Mozqueda Jaramillo, Kellyn Mansfield Alondra, RN Outcome: Progressing 08/04/2023 1609 by Mozqueda Jaramillo, Azucena Dart Alondra, RN Outcome: Progressing

## 2023-08-04 NOTE — Plan of Care (Addendum)
 1812: Update, Burnard Barter at [facility] called and said she did not receive any of the documents faxed, stated that there was not a bed available at [facility]. Toy confirmed that the affidavit and Petition for involuntary commitment has been filed and served to patient Psychologist, sport and exercise [id number]). Form has been uploaded in epic and copies placed on chart.  Forms faxed to [phone] per note. Based on the chat, bed was confirmed by [facility] at 1347, discharge and admission orders were placed, police IVC transport called and transported patient to [facility] [address] [place], [place]. Report given to [name] Blue. & all documentation faxed to Burnard Barter again at 1830 to [phone].  Problem: Education: Goal: Knowledge of General Education information will improve Description: Including pain rating scale, medication(s)/side effects and non-pharmacologic comfort measures Outcome: Progressing   Problem: Health Behavior/Discharge Planning: Goal: Ability to manage health-related needs will improve Outcome: Progressing   Problem: Clinical Measurements: Goal: Ability to maintain clinical measurements within normal limits will improve Outcome: Progressing Goal: Will remain free from infection Outcome: Progressing Goal: Diagnostic test results will improve Outcome: Progressing Goal: Respiratory complications will improve Outcome: Progressing Goal: Cardiovascular complication will be avoided Outcome: Progressing   Problem: Activity: Goal: Risk for activity intolerance will decrease Outcome: Progressing   Problem: Nutrition: Goal: Adequate nutrition will be maintained Outcome: Progressing   Problem: Coping: Goal: Level of anxiety will decrease Outcome: Progressing   Problem: Elimination: Goal: Will not experience complications related to bowel motility Outcome: Progressing Goal: Will not experience complications related to urinary retention Outcome: Progressing    Problem: Pain Managment: Goal: General experience of comfort will improve and/or be controlled Outcome: Progressing   Problem: Safety: Goal: Ability to remain free from injury will improve Outcome: Progressing   Problem: Skin Integrity: Goal: Risk for impaired skin integrity will decrease Outcome: Progressing

## 2023-08-04 NOTE — Discharge Summary (Signed)
 Physician Discharge Summary  Edmonia Gonser FMW:[id number] DOB: 23-Nov-2000 DOA: 08/01/2023  PCP: Pcp, No  Admit date: 08/01/2023 Discharge date: 08/04/2023  Admitted From: Home Disposition: Transfer to behavioral health Hospital  Recommendations for Outpatient Follow-up:  Follow up with endocrinology in 1-2 weeks Do not restart insulin  pump until follow-up with endocrinology  Discharge Condition: Stable CODE STATUS: Full code Diet recommendation: Consistent carbohydrate diet  History of present illness:  Dawn [name] is a 23 y.o. female with past medical history significant for insulin -dependent diabetes mellitus/type I DM (follows with endocrinology outpatient, previously utilize insulin  pump), iron  deficiency anemia, depression, vitamin D  deficiency who presented to [facility] ED on 08/01/2023 from home via EMS with hypoglycemia.  Patient reports being out of diabetic medications over the last 2 days.  Blood sugar 534 upon EMS arrival.  Also reports nausea vomiting.  Denies fever/chills.  Patient with history of multiple admissions due to DKA from medication noncompliance.  She has been placed on multiple medications including transition to subcutaneous insulin ; however she has not been adherent to these medications.  Workup in the ED consistent with diabetic ketoacidosis.  Started on insulin  drip.  History otherwise limited due to lethargy.  PCCM was consulted and patient was admitted to the intensive care unit.   Significant hospital events: 7/29: Admitted to PCCM/ICU, insulin  drip 7/31: Insulin  drip transitioned to subcutaneous insulin , seen by diabetic educator 8/1: Transferred to [facility] service; psychiatry consulted for suicidal ideation, IVC placed; transferring to behavioral health [facility] course:  Diabetic ketoacidosis/AGMA Type 1 diabetes mellitus Pseudohyponatremia due to hyperglycemia: Resolved Metabolic encephalopathy/lactic acidosis secondary to DKA Patient presenting to  ED with lethargy, nausea/vomiting and found to have a blood sugar of 534 on EMS arrival.  pH 6.99, PCO2 22, PO2 34 on ABG at time of admission.  Etiology likely secondary to medical noncompliance, history of multiple hospitalizations for DKA.  Follows with endocrinology at [facility] health, Dr. Goukassian.  Last seen by endocrinologist while hospitalized at [facility] for DKA May 2025, at that time she was discontinued from her insulin  pump.  Home regimen includes glargine 22 units nightly with NovoLog  4 units 3 times daily AC.  Patient was started on insulin  drip, anion gap resolved and was transition back to subcutaneous insulin .  Continue Semglee  20 units WSA daily, NovoLog  4 units 3 times daily AC (as long as eating greater than 50% of meals).  Outpatient follow-up with endocrinology.  Do not restart insulin  pump until is seen by the endocrinology outpatient.   Suicidal ideation Depression Prescribed Lexapro , states not utilizing.  While in the ICU, patient voiced thoughts of suicide ideation but no specific plan.  Reports history of previous SI attempt when she was younger, utilizing as true string and was hospitalized.  Reports poor social support outpatient.  Discharging to behavioral health hospital.   Hypokalemia Repleted.   Iron  deficiency anemia Hemoglobin baseline 9-10, stable.  Discharge Diagnoses:  Active Problems:   DKA (diabetic ketoacidosis) (HCC)   Major depressive disorder, recurrent severe without psychotic features [facility])    Discharge Instructions  Discharge Instructions     Continue involuntary commitment at discharge: chain of custody maintained   Complete by: As directed    Diet - low sodium heart healthy   Complete by: As directed    Increase activity slowly   Complete by: As directed       Allergies as of 08/04/2023       Reactions   Latex Itching, Swelling, Other (See  Comments)   Skin irritation   Shellfish Allergy Itching, Rash    Scallops specifically   Tape Other (See Comments)   Skin irritation        Medication List     PAUSE taking these medications    Omnipod 5 DexG7G6 Intro Gen 5 Kit Wait to take this until your doctor or other care provider tells you to start again. Use as prescribed       STOP taking these medications    insulin  lispro 100 UNIT/ML injection Commonly known as: HUMALOG        TAKE these medications    Baqsimi  Two Pack 3 MG/DOSE Powd Generic drug: Glucagon  Place 3 mg into the nose as needed (low blood sugar).   Dexcom G7 Sensor Misc Inject 1 Application into the skin as directed.   escitalopram  20 MG tablet Commonly known as: LEXAPRO  Take 20 mg by mouth daily.   insulin  aspart 100 UNIT/ML injection Commonly known as: novoLOG  Inject 4 Units into the skin 3 (three) times daily with meals. If eating > 50% of meals   insulin  glargine-yfgn 100 UNIT/ML injection Commonly known as: SEMGLEE  Inject 0.2 mLs (20 Units total) into the skin daily.   TechLite Plus Pen Needles 32G X 4 MM Misc Generic drug: Insulin  Pen Needle Use as directed with insulin .        Follow-up Information     Goukassian, Ilona, DO. Schedule an appointment as soon as possible for a visit.   Specialty: Endocrinology Contact information: [address] [place] [address] [phone]                Allergies  Allergen Reactions   Latex Itching, Swelling and Other (See Comments)    Skin irritation   Shellfish Allergy Itching and Rash    Scallops specifically   Tape Other (See Comments)    Skin irritation    Consultations: PCCM Psychiatry   Procedures/Studies: DG Chest Port 1 View Result Date: 08/02/2023 CLINICAL DATA:  [id number] Encounter for central line placement [id number]. EXAM: PORTABLE CHEST 1 VIEW COMPARISON:  08/01/2023. FINDINGS: Bilateral lung fields are clear. Bilateral costophrenic angles are clear. Normal cardio-mediastinal silhouette. No acute osseous  abnormalities. The soft tissues are within normal limits. IMPRESSION: No active disease. Electronically Signed   By: Ree Molt M.D.   On: 08/02/2023 09:50   DG Chest Port 1 View Result Date: 08/01/2023 CLINICAL DATA:  Shortness of breath EXAM: PORTABLE CHEST 1 VIEW COMPARISON:  07/19/2023 FINDINGS: Heart and mediastinal contours are within normal limits. No focal opacities or effusions. No acute bony abnormality. IMPRESSION: No active disease. Electronically Signed   By: Franky Crease M.D.   On: 08/01/2023 22:16   DG Chest 1 View Result Date: 07/19/2023 CLINICAL DATA:  Emesis EXAM: PORTABLE CHEST 1 VIEW COMPARISON:  01/02/2023 FINDINGS: The heart size and mediastinal contours are within normal limits. Both lungs are clear. The visualized skeletal structures are unremarkable. IMPRESSION: No active disease. Electronically Signed   By: Oneil Devonshire M.D.   On: 07/19/2023 02:44     Subjective: Patient seen examined bedside, lying in bed. Sitter present. No complaints this morning. Glucose stable. Tolerating diet. Seen by psychiatry, placed under IVC. No other specific complaints, concerns or questions at this time. Currently denies SI/HI. Further denies headache, no dizziness, no chest pain, no palpitations, no shortness of breath, no abdominal pain, no fever/chills/night sweats, no nausea/vomiting/diarrhea, no focal weakness, no fatigue, no paresthesia. No acute events overnight per nursing  staff.   Transfer/discharging to behavioral health Hospital  Discharge Exam: Vitals:   08/04/23 0618 08/04/23 1422  BP: 106/72 133/88  Pulse: 83 86  Resp: 18 18  Temp: 98.5 F (36.9 C) 98.4 F (36.9 C)  SpO2: 100% 100%   Vitals:   08/03/23 2031 08/04/23 0618 08/04/23 0700 08/04/23 1422  BP: 119/85 106/72  133/88  Pulse: 88 83  86  Resp: 18 18  18   Temp: 98.5 F (36.9 C) 98.5 F (36.9 C)  98.4 F (36.9 C)  TempSrc: Oral Oral  Oral  SpO2: 100% 100%  100%  Weight:   55.5 kg   Height:         Physical Exam: GEN: NAD, alert and oriented x 3, thin in appearance HEENT: NCAT, PERRL, EOMI, sclera clear, MMM PULM: CTAB w/o wheezes/crackles, normal respiratory effort, on room air CV: RRR w/o M/G/R GI: abd soft, NTND, + BS MSK: no peripheral edema, moves all extremities independently NEURO: CN II-XII intact, no focal deficits, sensation to light touch intact PSYCH: Depressed mood, flat affect, denies SI/HI Integumentary: dry/intact, no rashes or wounds    The results of significant diagnostics from this hospitalization (including imaging, microbiology, ancillary and laboratory) are listed below for reference.     Microbiology: Recent Results (from the past 240 hours)  MRSA Next Gen by PCR, Nasal     Status: Abnormal   Collection Time: 08/01/23 10:06 PM   Specimen: Nasal Mucosa; Nasal Swab  Result Value Ref Range Status   MRSA by PCR Next Gen DETECTED (A) NOT DETECTED Final    Comment: (NOTE) The GeneXpert MRSA Assay (FDA approved for NASAL specimens only), is one component of a comprehensive MRSA colonization surveillance program. It is not intended to diagnose MRSA infection nor to guide or monitor treatment for MRSA infections. Test performance is not FDA approved in patients less than 69 years old. Performed at [facility], [address] [place], [place] [address]      Labs: BNP (last 3 results) No results for input(s): BNP in the last 8760 hours. Basic Metabolic Panel: Recent Labs  Lab 07/28/23 2012 08/01/23 1937 08/02/23 1607 08/02/23 1956 08/03/23 0008 08/03/23 0456 08/04/23 1104  NA 138   < > 143 142 143 139 141  K 4.6   < > 3.7 3.6 3.9 3.2* 4.0  CL 104   < > 117* 112* 113* 112* 114*  CO2 23   < > 19* 20* 21* 21* 19*  GLUCOSE 160*   < > 143* 204* 169* 122* 95  BUN 26*   < > 14 13 10 10 9   CREATININE 0.66   < > 0.91 0.90 0.93 0.92 0.79  CALCIUM  9.2   < > 8.5* 8.8* 8.9 8.3* 8.7*  MG 2.7*  --   --   --   --   --   --    < >  = values in this interval not displayed.   Liver Function Tests: Recent Labs  Lab 07/28/23 2012 08/01/23 1937  AST 21 29  ALT 16 24  ALKPHOS 125 180*  BILITOT 0.6 2.4*  PROT 8.0 8.6*  ALBUMIN 3.6 3.8   No results for input(s): LIPASE, AMYLASE in the last 168 hours. No results for input(s): AMMONIA in the last 168 hours. CBC: Recent Labs  Lab 07/28/23 2012 08/01/23 1916 08/01/23 2017 08/02/23 0325  WBC 9.3 21.2*  --  16.7*  NEUTROABS 6.7  --   --   --  HGB 9.9* 11.8* 15.0 10.9*  HCT 33.2* 41.4 44.0 38.3  MCV 83.8 89.4  --  88.0  PLT 389 530*  --  366   Cardiac Enzymes: No results for input(s): CKTOTAL, CKMB, CKMBINDEX, TROPONINI in the last 168 hours. BNP: Invalid input(s): POCBNP CBG: Recent Labs  Lab 08/03/23 1723 08/03/23 2204 08/04/23 0728 08/04/23 1123 08/04/23 1609  GLUCAP 280* 137* 144* 74 248*   D-Dimer No results for input(s): DDIMER in the last 72 hours. Hgb A1c No results for input(s): HGBA1C in the last 72 hours. Lipid Profile No results for input(s): CHOL, HDL, LDLCALC, TRIG, CHOLHDL, LDLDIRECT in the last 72 hours. Thyroid  function studies No results for input(s): TSH, T4TOTAL, T3FREE, THYROIDAB in the last 72 hours.  Invalid input(s): FREET3 Anemia work up No results for input(s): VITAMINB12, FOLATE, FERRITIN, TIBC, IRON , RETICCTPCT in the last 72 hours. Urinalysis    Component Value Date/Time   COLORURINE STRAW (A) 08/01/2023 2323   APPEARANCEUR CLEAR 08/01/2023 2323   APPEARANCEUR Clear 06/16/2021 1517   LABSPEC 1.016 08/01/2023 2323   PHURINE 5.0 08/01/2023 2323   GLUCOSEU >=500 (A) 08/01/2023 2323   HGBUR SMALL (A) 08/01/2023 2323   BILIRUBINUR NEGATIVE 08/01/2023 2323   BILIRUBINUR negative 10/14/2021 0942   BILIRUBINUR Negative 06/16/2021 1517   KETONESUR 80 (A) 08/01/2023 2323   PROTEINUR 30 (A) 08/01/2023 2323   UROBILINOGEN 0.2 10/14/2021 0942   UROBILINOGEN 0.2  08/26/2019 1827   NITRITE NEGATIVE 08/01/2023 2323   LEUKOCYTESUR NEGATIVE 08/01/2023 2323   Sepsis Labs Recent Labs  Lab 07/28/23 2012 08/01/23 1916 08/02/23 0325  WBC 9.3 21.2* 16.7*   Microbiology Recent Results (from the past 240 hours)  MRSA Next Gen by PCR, Nasal     Status: Abnormal   Collection Time: 08/01/23 10:06 PM   Specimen: Nasal Mucosa; Nasal Swab  Result Value Ref Range Status   MRSA by PCR Next Gen DETECTED (A) NOT DETECTED Final    Comment: (NOTE) The GeneXpert MRSA Assay (FDA approved for NASAL specimens only), is one component of a comprehensive MRSA colonization surveillance program. It is not intended to diagnose MRSA infection nor to guide or monitor treatment for MRSA infections. Test performance is not FDA approved in patients less than 70 years old. Performed at [facility], [address], [place], [place] [address]      Time coordinating discharge: Over 30 minutes  SIGNED:   Camellia PARAS [place], DO  [facility] 08/04/2023, 4:14 PM

## 2023-08-04 NOTE — Progress Notes (Signed)
 Pt refused prn clonidine.

## 2023-08-05 ENCOUNTER — Other Ambulatory Visit: Payer: Self-pay

## 2023-08-05 DIAGNOSIS — F332 Major depressive disorder, recurrent severe without psychotic features: Secondary | ICD-10-CM | POA: Diagnosis not present

## 2023-08-05 LAB — GLUCOSE, CAPILLARY
Glucose-Capillary: 150 mg/dL — ABNORMAL HIGH (ref 70–99)
Glucose-Capillary: 173 mg/dL — ABNORMAL HIGH (ref 70–99)
Glucose-Capillary: 232 mg/dL — ABNORMAL HIGH (ref 70–99)
Glucose-Capillary: 167 mg/dL — ABNORMAL HIGH (ref 70–99)

## 2023-08-05 MED ORDER — MUPIROCIN 2 % EX OINT
1.0000 | TOPICAL_OINTMENT | Freq: Two times a day (BID) | CUTANEOUS | Status: DC
  Filled 2023-08-05: qty 22
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
## 2023-08-05 NOTE — Progress Notes (Signed)
  Collateral information: Patient's Aunt Dawn [name] called per patient admission for collateral information.  Dawn [name] reported that she raised the patient since she was 23 years old.  That patient recently lost her Medicaid and became frustrated with her medical condition of diabetes and overall life situation and became overwhelmed.  Report patient states that she wanted to kill herself out of frustration with her medical condition and life stressors.  Added that the patient did not mean what she said, and that patient could be discharged tomorrow.  Further, Dawn [name] will be available to pick patient up at 1 PM tomorrow 08/06/2023.  Dawn Azure, NP, Psychiatry

## 2023-08-05 NOTE — Progress Notes (Signed)
   08/05/23 2257  Psych Admission Type (Psych Patients Only)  Admission Status Involuntary  Psychosocial Assessment  Patient Complaints Anxiety  Eye Contact Fair  Facial Expression Animated  Affect Appropriate to circumstance  Speech Logical/coherent  Interaction Assertive  Motor Activity Other (Comment) (WDL)  Appearance/Hygiene Improved  Behavior Characteristics Appropriate to situation  Mood Pleasant  Thought Process  Coherency WDL  Content WDL  Delusions None reported or observed  Perception WDL  Hallucination None reported or observed  Judgment WDL  Confusion None  Danger to Self  Current suicidal ideation? Denies  Danger to Others  Danger to Others None reported or observed

## 2023-08-05 NOTE — [facility] Suicide Risk Assessment (Signed)
 [facility] Admission Suicide Risk Assessment   Nursing information obtained from:    Demographic factors:  Adolescent or young adult, Low socioeconomic status, Unemployed Current Mental Status:  NA Loss Factors:  Decrease in vocational status, Decline in physical health, Financial problems / change in socioeconomic status Historical Factors:  Prior suicide attempts Risk Reduction Factors:  NA  Total Time spent with patient: 30 minutes Principal Problem: MDD (major depressive disorder), recurrent episode, severe (HCC) Diagnosis:  Principal Problem:   MDD (major depressive disorder), recurrent episode, severe (HCC) Active Problems:   Major depressive disorder, recurrent severe without psychotic features (HCC)  Subjective Data:  23 year old African-American female, single, lives with her mother and her minor child, employed as a Lawyer. No past history of mental illness. Has a history of insulin -dependent diabetes. Repeated hospital visits due to complications of diabetes management. Presented on 07 25 with seizures related to hypoglycemia. Presented on 07 29 with hyperglycemia. Involuntarily committed on account of expressing hopelessness or worthlessness.   Continued Clinical Symptoms:    The Alcohol Use Disorders Identification Test, Guidelines for Use in Primary Care, Second Edition.  World Science writer [facility]). Score between 0-7:  no or low risk or alcohol related problems. Score between 8-15:  moderate risk of alcohol related problems. Score between 16-19:  high risk of alcohol related problems. Score 20 or above:  warrants further diagnostic evaluation for alcohol dependence and treatment.   CLINICAL FACTORS:  Chronic medical issues.   Musculoskeletal: Strength & Muscle Tone: within normal limits Gait & Station: normal Patient leans: N/A  Psychiatric Specialty Exam:  Presentation  General Appearance:  Casually dressed, not in any distress, appropriate behavior, engaged  politely.  No EPS.  Eye Contact: Good.  Speech: Spontaneous.  Normal rate, tone and volume.  Normal prosody of speech.  Mood and Affect  Mood: Euthymic.  Affect: Full range and appropriate.  Thought Process  Thought Processes: Linear and goal directed.  Descriptions of Associations:Intact  Orientation:Full (Time, Place and Person)  Thought Content: Future oriented.  No current suicidal thoughts.  No homicidal thoughts.  No thoughts of violence.  No negative ruminative flooding.  No guilty ruminations.  No delusional theme.  No obsessions.  Hallucinations: No hallucination in any modality.  Sensorium  Memory: Good.  Judgment: Good.  Insight: Good  Executive Functions  Concentration: Good.  Attention Span: Good.  Recall: Good.  Fund of Knowledge: Good.  Language: Good   Psychomotor Activity  Normal psychomotor activity    Physical Exam: Physical Exam ROS Blood pressure (!) 135/92, pulse 99, temperature 98.3 F (36.8 C), weight 55.5 kg, SpO2 100%. Body mass index is 21.67 kg/m.   COGNITIVE FEATURES THAT CONTRIBUTE TO RISK:  None    SUICIDE RISK:   Minimal: No identifiable suicidal ideation.  Patients presenting with no risk factors but with morbid ruminations; may be classified as minimal risk based on the severity of the depressive symptoms  PLAN OF CARE:  Patient will be placed on every 15 minute check.  I certify that inpatient services furnished can reasonably be expected to improve the patient's condition.   Jerrell DELENA Forehand, MD 08/05/2023, 2:54 PM

## 2023-08-05 NOTE — Progress Notes (Signed)
(  Sleep Hours) -6.5 (Any PRNs that were needed, meds refused, or side effects to meds)- refused pepcid  (Any disturbances and when (visitation, over night)-none (Concerns raised by the patient)- reports she feels she should not have been admitted here (SI/HI/AVH)-denies all

## 2023-08-05 NOTE — Progress Notes (Signed)
 Pt observed with flat affect, fair eye contact, logical and soft speech with slow gait on initial interactions. Denies SI, HI, AVH and pain when assessed. Reports she slept well with fair appetite. Continues to ruminate about events leading to admission. Rates her depression 8/10 and anxiety 6/10 with being unsure of [facility] admission as her mains stressor. Per pt I never said I wanted to kill myself, I need help to get my insulin , I don't know why they sent me here for. Visible in milieu majority of this shift. Attended and participated in scheduled groups on and off unit without issues. Tolerates meals and fluids well. Compliant with current treatment regimen. Pt's concerns validated, emotional support, encouragement and reassurance offered. Safety maintained at Q 15 minutes intervals without issues.
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
## 2023-08-05 NOTE — [facility] Group Notes (Signed)
 Adult Psychoeducational Group Note  Date:  08/05/2023 Time:  10:20 AM  Group Topic/Focus:  Goals Group:   The focus of this group is to help patients establish daily goals to achieve during treatment and discuss how the patient can incorporate goal setting into their daily lives to aide in recovery. Orientation:   The focus of this group is to educate the patient on the purpose and policies of crisis stabilization and provide a format to answer questions about their admission.  The group details unit policies and expectations of patients while admitted.  Participation Level:  Did Not Attend  Participation Quality:    Affect:    Cognitive:    Insight:   Engagement in Group:    Modes of Intervention:    Additional Comments:    Dawn [name] 08/05/2023, 10:20 AM

## 2023-08-05 NOTE — Plan of Care (Signed)
   Problem: Education: Goal: Emotional status will improve Outcome: Progressing Goal: Mental status will improve Outcome: Progressing

## 2023-08-05 NOTE — Group Note (Signed)
 Date:  08/05/2023 Time:  9:01 PM  Group Topic/Focus:  Wrap-Up Group:   The focus of this group is to help patients review their daily goal of treatment and discuss progress on daily workbooks.    Participation Level:  Active  Participation Quality:  Appropriate  Affect:  Appropriate  Cognitive:  Appropriate  Insight: Improving  Engagement in Group:  Engaged  Modes of Intervention:  Discussion  Additional Comments:  Pt attended the evening wrap-up group. Tech introduced the staff for the evening, reminded group of the evening schedule and reminded them to ask for anything they need. Pt read and discussed a poem named Autobiography in [employer]. Pt shared her perspective and how it applies to her life.  [name] [name] [place] 08/05/2023, 9:01 PM

## 2023-08-05 NOTE — Plan of Care (Signed)
  Problem: Education: Goal: Mental status will improve Outcome: Progressing   Problem: Activity: Goal: Interest or engagement in activities will improve Outcome: Progressing Goal: Sleeping patterns will improve Outcome: Progressing

## 2023-08-06 DIAGNOSIS — F332 Major depressive disorder, recurrent severe without psychotic features: Secondary | ICD-10-CM | POA: Diagnosis not present

## 2023-08-06 LAB — GLUCOSE, CAPILLARY
Glucose-Capillary: 99 mg/dL (ref 70–99)
Glucose-Capillary: 124 mg/dL — ABNORMAL HIGH (ref 70–99)

## 2023-08-06 NOTE — [facility] Suicide Risk Assessment (Signed)
 [facility] INPATIENT:  Family/Significant Other Suicide Prevention Education  Suicide Prevention Education:  Education Completed; [name] ,  Midwife) has been identified by the patient as the family member/significant other with whom the patient will be residing, and identified as the person(s) who will aid the patient in the event of a mental health crisis (suicidal ideations/suicide attempt).  With written consent from the patient, the family member/significant other has been provided the following suicide prevention education, prior to the and/or following the discharge of the patient.  The suicide prevention education provided includes the following: Suicide risk factors Suicide prevention and interventions National Suicide Hotline telephone number [facility] assessment telephone number [facility] Emergency Assistance 911 [facility] and/or Residential Mobile Crisis Unit telephone number  Request made of family/significant other to: Remove weapons (e.g., guns, rifles, knives), all items previously/currently identified as safety concern.   Remove drugs/medications (over-the-counter, prescriptions, illicit drugs), all items previously/currently identified as a safety concern.  The family member/significant other verbalizes understanding of the suicide prevention education information provided.  The family member/significant other agrees to remove the items of safety concern listed above.  Dawn [name] Dawn [name] 08/06/2023, 9:57 AM

## 2023-08-06 NOTE — Progress Notes (Signed)
Discharge Note:  Patient denies SI/HI/AVH at this time. Discharge instructions, AVS, prescriptions, and transition record gone over with patient. Patient agrees to comply with medication management, follow-up visit, and outpatient therapy. Patient belongings returned to patient. Patient questions and concerns addressed and answered. Patient ambulatory off unit. Patient discharged to home.
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
## 2023-08-06 NOTE — Progress Notes (Signed)
(  Sleep Hours) - 6.25 hours (Any PRNs that were needed, meds refused, or side effects to meds)-  None (Any disturbances and when (visitation, over night)- None (Concerns raised by the patient)-  None (SI/HI/AVH)-  Denies

## 2023-08-06 NOTE — Progress Notes (Signed)
   08/06/23 0800  Psych Admission Type (Psych Patients Only)  Admission Status Involuntary  Psychosocial Assessment  Patient Complaints None  Eye Contact Fair  Facial Expression Animated  Affect Appropriate to circumstance  Speech Logical/coherent  Interaction Assertive  Motor Activity Other (Comment) (WNL)  Appearance/Hygiene Unremarkable  Behavior Characteristics Appropriate to situation  Mood Pleasant  Thought Process  Coherency WDL  Content WDL  Delusions None reported or observed  Perception WDL  Hallucination None reported or observed  Judgment WDL  Confusion None  Danger to Self  Current suicidal ideation? Denies  Danger to Others  Danger to Others None reported or observed

## 2023-08-06 NOTE — Progress Notes (Signed)
  [facility] Adult Case Management Discharge Plan :  Will you be returning to the same living situation after discharge:  Yes,  the patient will be going to live with her Wylie Arrant At discharge, do you have transportation home?: Yes,  The patient stated that her aunt will be picking her up Do you have the ability to pay for your medications: Yes,  The patient share that she will be able to get her medication  Release of information consent forms completed and in the chart;  Patient's signature needed at discharge.  Patient to Follow up at:  Follow-up Information     [facility] Follow up.   Specialty: Urgent Care Contact information: [address] [place]  [phone]        [facility] Follow up.   Why: Resource with [facility] was given to patient for Support group session                Next level of care provider has access to [facility] Link:no  Safety Planning and Suicide Prevention discussed: Yes,  CSW called and spoke to the patient's Aunt     Has patient been referred to the Quitline?: Patient refused referral for treatment  Patient has been referred for addiction treatment: Patient refused referral for treatment.  Naviah Belfield O Mariya Mottley, LCSWA 08/06/2023, 9:57 AM

## 2023-08-06 NOTE — [facility] Suicide Risk Assessment (Signed)
 [facility] Discharge Suicide Risk Assessment   Principal Problem: MDD (major depressive disorder), recurrent episode, severe (HCC) Discharge Diagnoses: Principal Problem:   MDD (major depressive disorder), recurrent episode, severe (HCC) Active Problems:   Major depressive disorder, recurrent severe without psychotic features (HCC)   Total Time spent with patient: 30 minutes  Musculoskeletal: Strength & Muscle Tone: within normal limits Gait & Station: normal Patient leans: Right  Psychiatric Specialty Exam  Presentation  General Appearance:  Casually dressed, not in any distress, appropriate behavior, engaged politely.  No EPS.  Eye Contact: Good.  Speech: Spontaneous.  Normal rate, tone and volume.  Normal prosody of speech.  Mood and Affect  Mood: Euthymic.  Affect: Full range and appropriate.  Thought Process  Thought Processes: Linear and goal directed.  Descriptions of Associations:Intact  Orientation:Full (Time, Place and Person)  Thought Content: Future oriented.  No current suicidal thoughts.  No homicidal thoughts.  No thoughts of violence.  No negative ruminative flooding.  No guilty ruminations.  No delusional theme.  No obsessions.  Hallucinations: No hallucination in any modality.  Sensorium  Memory: Good.  Judgment: Good.  Insight: Good  Executive Functions  Concentration: Good.  Attention Span: Good.  Recall: Good.  Fund of Knowledge: Good.  Language: Good   Psychomotor Activity  Normal psychomotor activity   Physical Exam: Physical Exam ROS Blood pressure (!) 120/95, pulse (!) 107, temperature 98.3 F (36.8 C), temperature source Oral, resp. rate 14, weight 55.5 kg, SpO2 100%. Body mass index is 21.67 kg/m.  Mental Status Per Nursing Assessment::   On Admission:  NA  Demographic Factors:  NA  Loss Factors: Decline in physical health  Historical Factors: NA  Risk Reduction Factors:   Responsible for children  under 58 years of age, Sense of responsibility to family, Employed, Living with another person, especially a relative, Positive social support, Positive therapeutic relationship, and Positive coping skills or problem solving skills  Continued Clinical Symptoms:  No evidence of depression.  No overwhelming anxiety.  No evidence of mania.  No evidence of psychosis.  Cognitive Features That Contribute To Risk:  None    Suicide Risk:  Minimal: No current suicidal thoughts.  No current homicidal thoughts.  No current thoughts of violence.  Patient does not have enduring mental illness.  She was frustrated with her diabetes care.  No substance use.  She has good support at home. Patient is stable for care to lower setting.  Follow-up Information     [facility] Follow up.   Specialty: Urgent Care Contact information: [address] [place]  [phone]        [facility] Follow up.   Why: Resource with [facility] was given to patient for Support group session                Plan Of Care/Follow-up recommendations:  See discharge summary.  Jerrell DELENA Forehand, MD 08/06/2023, 10:56 AM

## 2023-08-06 NOTE — Plan of Care (Signed)
   Problem: Education: Goal: Emotional status will improve Outcome: Progressing   Problem: Education: Goal: Verbalization of understanding the information provided will improve Outcome: Progressing

## 2023-08-06 NOTE — Group Note (Signed)
 Date:  08/06/2023 Time:  8:58 AM  Group Topic/Focus:  Goals Group:   The focus of this group is to help patients establish daily goals to achieve during treatment and discuss how the patient can incorporate goal setting into their daily lives to aide in recovery.    Participation Level:  Active  Participation Quality:  Appropriate  Affect:  Appropriate  Cognitive:  Appropriate  Insight: Good  Engagement in Group:  Engaged  Modes of Intervention:  Orientation  Additional Comments:  d/c today  Dawn [name] 08/06/2023, 8:58 AM

## 2023-08-10 ENCOUNTER — Other Ambulatory Visit (HOSPITAL_COMMUNITY): Payer: Self-pay

## 2023-08-11 NOTE — Telephone Encounter (Signed)
 Pharmacy, please route to Dr. Jac office.  Thank you.

## 2023-08-27 ENCOUNTER — Encounter (HOSPITAL_COMMUNITY): Payer: Self-pay | Admitting: Emergency Medicine

## 2023-08-27 ENCOUNTER — Other Ambulatory Visit: Payer: Self-pay

## 2023-08-27 ENCOUNTER — Ambulatory Visit (HOSPITAL_COMMUNITY)
Admission: EM | Admit: 2023-08-27 | Discharge: 2023-08-27 | Disposition: A | Discharge status: Home / Self Care | Payer: MEDICAID | Attending: Emergency Medicine | Admitting: Emergency Medicine

## 2023-08-27 DIAGNOSIS — L509 Urticaria, unspecified: Secondary | ICD-10-CM | POA: Diagnosis not present

## 2023-08-27 MED ORDER — CETIRIZINE HCL 10 MG PO TABS: 10.0000 mg | ORAL_TABLET | Freq: Every day | ORAL | 2 refills | Status: AC

## 2023-08-27 MED ORDER — MONTELUKAST SODIUM 10 MG PO TABS: 10.0000 mg | ORAL_TABLET | Freq: Every day | ORAL | 2 refills | Status: DC

## 2023-08-27 NOTE — Discharge Instructions (Signed)
 Please read below to learn more about the medications, dosages and frequencies that I recommend to help alleviate your symptoms and to get you feeling better soon:   Zyrtec (cetirizine): This is an excellent second-generation antihistamine that helps to reduce respiratory inflammatory response to environmental allergens.  In some patients, this medication can cause daytime sleepiness so I recommend that you take 1 tablet daily at bedtime.     Singulair (montelukast): This is a mast cell stabilizer that works well with antihistamines.  Mast cells are responsible for stimulating histamine production.  Reducing the activity of mast cells decreases the amount of histamines will be produced.  This reduces upper and lower respiratory inflammation caused by allergy exposure.  I recommend that you take this medication at the same time you take your antihistamine.   If symptoms have not meaningfully improved in the next 5 to 7 days, please return for repeat evaluation or follow-up with your regular provider.  If symptoms have worsened in the next 3 to 5 days, please return for repeat evaluation or follow-up with your regular provider.    Thank you for visiting urgent care today.  We appreciate the opportunity to participate in your care.
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
## 2023-08-27 NOTE — ED Triage Notes (Signed)
 Hives started yesterday.  Did take benadryl  and seemed to help yesterday.    Patient took benadryl  today and it did not help.    Denies any difficulty swallowing, no difficulty breathing.  Has no idea what may have caused reaction.    Took benadryl  around 10:00

## 2023-09-05 ENCOUNTER — Encounter (HOSPITAL_COMMUNITY): Payer: Self-pay

## 2023-09-05 ENCOUNTER — Other Ambulatory Visit: Payer: Self-pay

## 2023-09-05 ENCOUNTER — Emergency Department (HOSPITAL_COMMUNITY)
Admission: EM | Admit: 2023-09-05 | Discharge: 2023-09-06 | Disposition: A | Discharge status: Home / Self Care | Payer: MEDICAID | Attending: Emergency Medicine | Admitting: Emergency Medicine

## 2023-09-05 DIAGNOSIS — E162 Hypoglycemia, unspecified: Secondary | ICD-10-CM | POA: Insufficient documentation

## 2023-09-05 DIAGNOSIS — Z794 Long term (current) use of insulin: Secondary | ICD-10-CM | POA: Insufficient documentation

## 2023-09-05 DIAGNOSIS — Z91040 Latex allergy status: Secondary | ICD-10-CM | POA: Insufficient documentation

## 2023-09-05 LAB — COMPREHENSIVE METABOLIC PANEL WITH GFR
ALT: 24 U/L (ref 0–44)
AST: 37 U/L (ref 15–41)
Albumin: 3.7 g/dL (ref 3.5–5.0)
Alkaline Phosphatase: 108 U/L (ref 38–126)
Anion gap: 14 (ref 5–15)
BUN: 23 mg/dL — ABNORMAL HIGH (ref 6–20)
CO2: 24 mmol/L (ref 22–32)
Calcium: 9.1 mg/dL (ref 8.9–10.3)
Chloride: 97 mmol/L — ABNORMAL LOW (ref 98–111)
Creatinine, Ser: 0.88 mg/dL (ref 0.44–1.00)
GFR, Estimated: >60 mL/min (ref >60)
Glucose, Bld: 215 mg/dL — ABNORMAL HIGH (ref 70–99)
Potassium: 3.7 mmol/L (ref 3.5–5.1)
Sodium: 135 mmol/L (ref 135–145)
Total Bilirubin: 0.9 mg/dL (ref 0.0–1.2)
Total Protein: 7.5 g/dL (ref 6.5–8.1)

## 2023-09-05 LAB — CBC WITH DIFFERENTIAL/PLATELET
Abs Immature Granulocytes: 0.03 K/uL (ref 0.00–0.07)
Basophils Absolute: 0.1 K/uL (ref 0.0–0.1)
Basophils Relative: 1 %
Eosinophils Absolute: 0.1 K/uL (ref 0.0–0.5)
Eosinophils Relative: 1 %
HCT: 31.8 % — ABNORMAL LOW (ref 36.0–46.0)
Hemoglobin: 10 g/dL — ABNORMAL LOW (ref 12.0–15.0)
Immature Granulocytes: 0 %
Lymphocytes Relative: 31 %
Lymphs Abs: 2.6 K/uL (ref 0.7–4.0)
MCH: 25.7 pg — ABNORMAL LOW (ref 26.0–34.0)
MCHC: 31.4 g/dL (ref 30.0–36.0)
MCV: 81.7 fL (ref 80.0–100.0)
Monocytes Absolute: 0.8 K/uL (ref 0.1–1.0)
Monocytes Relative: 10 %
Neutro Abs: 5 K/uL (ref 1.7–7.7)
Neutrophils Relative %: 57 %
Platelets: 355 K/uL (ref 150–400)
RBC: 3.89 MIL/uL (ref 3.87–5.11)
RDW: 17 % — ABNORMAL HIGH (ref 11.5–15.5)
WBC: 8.5 K/uL (ref 4.0–10.5)
nRBC: 0 % (ref 0.0–0.2)

## 2023-09-05 LAB — CBG MONITORING, ED
Glucose-Capillary: 152 mg/dL — ABNORMAL HIGH (ref 70–99)
Glucose-Capillary: 381 mg/dL — ABNORMAL HIGH (ref 70–99)
Glucose-Capillary: 546 mg/dL (ref 70–99)

## 2023-09-05 LAB — HCG, SERUM, QUALITATIVE: Preg, Serum: NEGATIVE

## 2023-09-05 LAB — MAGNESIUM: Magnesium: 2.3 mg/dL (ref 1.7–2.4)

## 2023-09-05 MED ORDER — SODIUM CHLORIDE 0.9 % IV BOLUS
1000.0000 mL | Freq: Once | INTRAVENOUS | Status: AC
  Administered 2023-09-05: 1000 mL via INTRAVENOUS

## 2023-09-05 MED ORDER — INSULIN ASPART 100 UNIT/ML IJ SOLN
5.0000 [IU] | Freq: Once | INTRAMUSCULAR | Status: AC
  Administered 2023-09-05: 5 [IU] via SUBCUTANEOUS

## 2023-09-05 NOTE — ED Notes (Signed)
 Called lab to ensure the blood work ordered would be ran on existing labs sent down upon patients arrival.

## 2023-09-05 NOTE — ED Provider Notes (Signed)
 [place] EMERGENCY DEPARTMENT AT [facility] Provider Note   CSN: [id number] Arrival date & time: 09/05/23  1652     Patient presents with: Hypoglycemia   Dawn [name] is a 23 y.o. female.   HPI   23 year old female presents emergency department concern for an episode of hypoglycemia.  Patient called 911 due to low blood sugar reading on her Dexcom.  She was found to have a blood sugar of 33.  She was given oral glucose and D10.  Repeat fingerstick was appropriate.  Patient never lost consciousness, she feels much improved.  She reveals a couple episodes of vomiting last night and this morning with decreased p.o. intake.  She is otherwise been compliant with her insulin  medicine.  Currently she does not utilize an insulin  pump but continues to use Dexcom for blood sugar readings.  Denies any acute changes or dose adjustments with her medications.  Prior to Admission medications   Medication Sig Start Date End Date Taking? Authorizing Provider  cetirizine  (ZYRTEC  ALLERGY) 10 MG tablet Take 1 tablet (10 mg total) by mouth at bedtime. 08/27/23 11/25/23  Joesph Shaver Scales, PA-C  Continuous Glucose Sensor (DEXCOM G7 SENSOR) MISC Inject 1 Application into the skin as directed. 10/30/22   Diona Perkins, MD  Glucagon  (BAQSIMI  TWO PACK) 3 MG/DOSE POWD Place 3 mg into the nose as needed (low blood sugar).    [provider]  insulin  aspart (NOVOLOG ) 100 UNIT/ML injection Inject 4 Units into the skin 3 (three) times daily with meals. If eating > 50% of meals 08/04/23   [place], Camellia PARAS, DO  Insulin  Disposable Pump (OMNIPOD 5 DEXG7G6 INTRO GEN 5) KIT Use as prescribed 07/28/23   Henderly, Britni A, PA-C  insulin  glargine-yfgn (SEMGLEE ) 100 UNIT/ML injection Inject 0.2 mLs (20 Units total) into the skin daily. 08/04/23   [place], Camellia PARAS, DO  Insulin  Pen Needle 32G X 4 MM MISC Use as directed with insulin . 04/11/23   [place], Prince, MD  montelukast  (SINGULAIR ) 10 MG tablet Take 1 tablet  (10 mg total) by mouth at bedtime. 08/27/23 11/25/23  Joesph Shaver Scales, PA-C    Allergies: Latex, Shellfish allergy, and Tape    Review of Systems  Constitutional:  Negative for fever.  Respiratory:  Negative for shortness of breath.   Cardiovascular:  Negative for chest pain.  Gastrointestinal:  Positive for vomiting. Negative for abdominal pain and diarrhea.  Skin:  Negative for rash.  Neurological:  Negative for headaches.    Updated Vital Signs BP 99/71   Pulse 94   Temp (!) 97.3 F (36.3 C) (Oral)   Resp 16   LMP 08/16/2023 Comment: Negative preg test 08/01/23  SpO2 99%   Physical Exam Vitals and nursing note reviewed.  Constitutional:      Appearance: Normal appearance.  HENT:     Head: Normocephalic.     Mouth/Throat:     Mouth: Mucous membranes are moist.  Cardiovascular:     Rate and Rhythm: Normal rate.  Pulmonary:     Effort: Pulmonary effort is normal. No respiratory distress.  Abdominal:     Palpations: Abdomen is soft.     Tenderness: There is no abdominal tenderness.  Skin:    General: Skin is warm.  Neurological:     Mental Status: She is alert and oriented to person, place, and time. Mental status is at baseline.  Psychiatric:        Mood and Affect: Mood normal.     (all labs  ordered are listed, but only abnormal results are displayed) Labs Reviewed  CBG MONITORING, ED - Abnormal; Notable for the following components:      Result Value   Glucose-Capillary 152 (*)    All other components within normal limits  CBG MONITORING, ED - Abnormal; Notable for the following components:   Glucose-Capillary 381 (*)    All other components within normal limits  CBC WITH DIFFERENTIAL/PLATELET  COMPREHENSIVE METABOLIC PANEL WITH GFR  HCG, SERUM, QUALITATIVE  MAGNESIUM     EKG: EKG Interpretation Date/Time:  Tuesday September 05 2023 17:03:44 EDT Ventricular Rate:  88 PR Interval:  133 QRS Duration:  77 QT Interval:  377 QTC  Calculation: 457 R Axis:   73  Text Interpretation: Sinus rhythm Right atrial enlargement Confirmed by Bari Flank [phone]) on 09/05/2023 7:24:18 PM  Radiology: No results found.   Procedures   Medications Ordered in the ED - No data to display                                  Medical Decision Making Amount and/or Complexity of Data Reviewed Labs: ordered.  Risk Prescription drug management.   23 year old female presents emergency department concern for an episode of hypoglycemia, resolved after treatment with EMS.  Patient admit to three episodes of vomiting last night and this morning but otherwise feels baseline.  Vitals are stable.  Blood work shows a baseline anemia, mild hyperglycemia of 215, no other acute abnormalities, no findings of DKA.  Unfortunately there was a delay in getting lab results.  By the time I was able to reevaluate the patient she had concern that her Dexcom was reading high for glucose.  Repeat CBG shows a glucose of 546.  Patient has been eating and drinking in the room without any additional medication.  IV fluids ordered and a subcu dose of insulin  given.  Will plan for repeat CBG after the fluids complete.  If the blood sugar is downtrending appropriately plan for outpatient follow-up.  Patient signed out pending repeat CBG.     Final diagnoses:  None    ED Discharge Orders     None          Bari Flank HERO, DO 09/05/23 2321

## 2023-09-05 NOTE — ED Triage Notes (Signed)
 Pt arrives to ED via EMS from home after the patient called 911 c/o low blood sugar. Upon fire rescue CGL found to be 33. Pt reported her dexcom read LO. She was given 15g of oral glucose and approximate of D10

## 2023-09-06 LAB — CBG MONITORING, ED: Glucose-Capillary: 414 mg/dL — ABNORMAL HIGH (ref 70–99)

## 2023-09-06 LAB — BASIC METABOLIC PANEL WITH GFR
Anion gap: 11 (ref 5–15)
BUN: 23 mg/dL — ABNORMAL HIGH (ref 6–20)
CO2: 20 mmol/L — ABNORMAL LOW (ref 22–32)
Calcium: 8.1 mg/dL — ABNORMAL LOW (ref 8.9–10.3)
Chloride: 101 mmol/L (ref 98–111)
Creatinine, Ser: 0.81 mg/dL (ref 0.44–1.00)
GFR, Estimated: >60 mL/min (ref >60)
Glucose, Bld: 257 mg/dL — ABNORMAL HIGH (ref 70–99)
Potassium: 4 mmol/L (ref 3.5–5.1)
Sodium: 132 mmol/L — ABNORMAL LOW (ref 135–145)

## 2023-09-06 MED ORDER — INSULIN ASPART 100 UNIT/ML IJ SOLN
5.0000 [IU] | Freq: Once | INTRAMUSCULAR | Status: AC
  Administered 2023-09-06: 5 [IU] via INTRAVENOUS

## 2023-09-06 MED ORDER — LACTATED RINGERS IV BOLUS
1000.0000 mL | Freq: Once | INTRAVENOUS | Status: AC
  Administered 2023-09-06: 1000 mL via INTRAVENOUS

## 2023-09-06 NOTE — Discharge Instructions (Signed)
 Continue to take your insulin  as prescribed and make sure you eat when taking your insulin .  Return to the ED with new or worsening symptoms

## 2023-09-06 NOTE — ED Provider Notes (Signed)
 Care assumed from Dr. Bari. Pending CBG  Blood sugar improved to 257.  She is tolerating p.o.  No evidence of DKA.  Appears stable for discharge   Carita Senior, MD 09/06/23 [phone]

## 2023-09-13 ENCOUNTER — Encounter (HOSPITAL_COMMUNITY): Payer: Self-pay

## 2023-09-13 ENCOUNTER — Emergency Department (HOSPITAL_COMMUNITY): Payer: MEDICAID

## 2023-09-13 ENCOUNTER — Encounter: Payer: Self-pay | Admitting: Physician Assistant

## 2023-09-13 ENCOUNTER — Emergency Department (HOSPITAL_COMMUNITY)
Admission: EM | Admit: 2023-09-13 | Discharge: 2023-09-13 | Disposition: A | Discharge status: Home / Self Care | Payer: MEDICAID

## 2023-09-13 ENCOUNTER — Other Ambulatory Visit: Payer: Self-pay

## 2023-09-13 DIAGNOSIS — Z91040 Latex allergy status: Secondary | ICD-10-CM | POA: Insufficient documentation

## 2023-09-13 DIAGNOSIS — E11649 Type 2 diabetes mellitus with hypoglycemia without coma: Secondary | ICD-10-CM | POA: Insufficient documentation

## 2023-09-13 DIAGNOSIS — Z794 Long term (current) use of insulin: Secondary | ICD-10-CM | POA: Diagnosis not present

## 2023-09-13 DIAGNOSIS — E162 Hypoglycemia, unspecified: Secondary | ICD-10-CM

## 2023-09-13 DIAGNOSIS — R519 Headache, unspecified: Secondary | ICD-10-CM | POA: Diagnosis not present

## 2023-09-13 LAB — HCG, SERUM, QUALITATIVE: Preg, Serum: NEGATIVE

## 2023-09-13 LAB — CBC
HCT: 37.4 % (ref 36.0–46.0)
Hemoglobin: 11.2 g/dL — ABNORMAL LOW (ref 12.0–15.0)
MCH: 25.3 pg — ABNORMAL LOW (ref 26.0–34.0)
MCHC: 29.9 g/dL — ABNORMAL LOW (ref 30.0–36.0)
MCV: 84.6 fL (ref 80.0–100.0)
Platelets: 318 K/uL (ref 150–400)
RBC: 4.42 MIL/uL (ref 3.87–5.11)
RDW: 17.4 % — ABNORMAL HIGH (ref 11.5–15.5)
WBC: 10.1 K/uL (ref 4.0–10.5)
nRBC: 0 % (ref 0.0–0.2)

## 2023-09-13 LAB — CBG MONITORING, ED
Glucose-Capillary: 75 mg/dL (ref 70–99)
Glucose-Capillary: 283 mg/dL — ABNORMAL HIGH (ref 70–99)

## 2023-09-13 LAB — BASIC METABOLIC PANEL WITH GFR
Anion gap: 9 (ref 5–15)
BUN: 17 mg/dL (ref 6–20)
CO2: 22 mmol/L (ref 22–32)
Calcium: 9.1 mg/dL (ref 8.9–10.3)
Chloride: 105 mmol/L (ref 98–111)
Creatinine, Ser: 0.69 mg/dL (ref 0.44–1.00)
GFR, Estimated: >60 mL/min (ref >60)
Glucose, Bld: 153 mg/dL — ABNORMAL HIGH (ref 70–99)
Potassium: 4 mmol/L (ref 3.5–5.1)
Sodium: 136 mmol/L (ref 135–145)

## 2023-09-13 NOTE — ED Provider Triage Note (Signed)
 Emergency Medicine Provider Triage Evaluation Note  Dawn [name] , a 23 y.o. female  was evaluated in triage.  Pt complains of seizure.  Patient has history of same but never been treated.  Patient has seizure today and she fell.  Complains of neck pain.  Denies any headaches..  Review of Systems  Positive: Loss of consciousness Negative: No fevers  Physical Exam  BP (!) 130/94   Pulse 80   Resp 15   Ht 1.549 m (5' 1)   Wt 54.4 kg   LMP 08/16/2023 Comment: Negative preg test 08/01/23  SpO2 100%   BMI 22.67 kg/m  Gen:   Awake, no distress   Resp:  Normal effort  MSK:   Moves extremities without difficulty  Other:  Neuroexam is stable  Medical Decision Making  Medically screening exam initiated at 10:26 AM.  Appropriate orders placed.  Dawn [name] was informed that the remainder of the evaluation will be completed by another provider, this initial triage assessment does not replace that evaluation, and the importance of remaining in the ED until their evaluation is complete.     Dawn Faden, MD 09/13/23 1027

## 2023-09-13 NOTE — ED Notes (Signed)
 Awaiting patient from lobby.

## 2023-09-13 NOTE — ED Provider Notes (Signed)
 [place] EMERGENCY DEPARTMENT AT [facility] Provider Note   CSN: [id number] Arrival date & time: 09/13/23  9056     Patient presents with: Hypoglycemia and Seizures   Dawn [name] is a 23 y.o. female.   23 year old female presents for evaluation of seizure-like activity and was found to be hypoglycemic.  Was given glucagon  and glucose has improved.  She is overall feeling better.  Denies any other symptoms or concerns at this time.   Hypoglycemia Associated symptoms: seizures   Associated symptoms: no shortness of breath and no vomiting   Seizures      Prior to Admission medications   Medication Sig Start Date End Date Taking? Authorizing Provider  cetirizine  (ZYRTEC  ALLERGY) 10 MG tablet Take 1 tablet (10 mg total) by mouth at bedtime. 08/27/23 11/25/23  Joesph Shaver Scales, PA-C  Continuous Glucose Sensor (DEXCOM G7 SENSOR) MISC Inject 1 Application into the skin as directed. 10/30/22   Diona Perkins, MD  Glucagon  (BAQSIMI  TWO PACK) 3 MG/DOSE POWD Place 3 mg into the nose as needed (low blood sugar).    [provider]  insulin  aspart (NOVOLOG ) 100 UNIT/ML injection Inject 4 Units into the skin 3 (three) times daily with meals. If eating > 50% of meals 08/04/23   [place], Camellia PARAS, DO  Insulin  Disposable Pump (OMNIPOD 5 DEXG7G6 INTRO GEN 5) KIT Use as prescribed 07/28/23   Henderly, Britni A, PA-C  insulin  glargine-yfgn (SEMGLEE ) 100 UNIT/ML injection Inject 0.2 mLs (20 Units total) into the skin daily. 08/04/23   [place], Camellia PARAS, DO  Insulin  Pen Needle 32G X 4 MM MISC Use as directed with insulin . 04/11/23   [place], Prince, MD  montelukast  (SINGULAIR ) 10 MG tablet Take 1 tablet (10 mg total) by mouth at bedtime. 08/27/23 11/25/23  Joesph Shaver Scales, PA-C    Allergies: Latex, Shellfish allergy, and Tape    Review of Systems  Constitutional:  Negative for chills and fever.  HENT:  Negative for ear pain and sore throat.   Eyes:  Negative for pain and visual  disturbance.  Respiratory:  Negative for cough and shortness of breath.   Cardiovascular:  Negative for chest pain and palpitations.  Gastrointestinal:  Negative for abdominal pain and vomiting.  Genitourinary:  Negative for dysuria and hematuria.  Musculoskeletal:  Negative for arthralgias and back pain.  Skin:  Negative for color change and rash.  Neurological:  Positive for seizures. Negative for syncope.  All other systems reviewed and are negative.   Updated Vital Signs BP (!) 162/111   Pulse 87   Temp 98.4 F (36.9 C) (Oral)   Resp (!) 21   Ht 5' 1 (1.549 m)   Wt 54.4 kg   LMP 08/16/2023 Comment: Negative preg test 08/01/23  SpO2 100%   BMI 22.67 kg/m   Physical Exam Vitals and nursing note reviewed.  Constitutional:      General: She is not in acute distress.    Appearance: She is well-developed.  HENT:     Head: Normocephalic and atraumatic.  Eyes:     Conjunctiva/sclera: Conjunctivae normal.  Cardiovascular:     Rate and Rhythm: Normal rate and regular rhythm.     Heart sounds: No murmur heard. Pulmonary:     Effort: Pulmonary effort is normal. No respiratory distress.     Breath sounds: Normal breath sounds.  Abdominal:     Palpations: Abdomen is soft.     Tenderness: There is no abdominal tenderness.  Musculoskeletal:  General: No swelling.     Cervical back: Neck supple.  Skin:    General: Skin is warm and dry.     Capillary Refill: Capillary refill takes less than 2 seconds.  Neurological:     Mental Status: She is alert.  Psychiatric:        Mood and Affect: Mood normal.     (all labs ordered are listed, but only abnormal results are displayed) Labs Reviewed  BASIC METABOLIC PANEL WITH GFR - Abnormal; Notable for the following components:      Result Value   Glucose, Bld 153 (*)    All other components within normal limits  CBC - Abnormal; Notable for the following components:   Hemoglobin 11.2 (*)    MCH 25.3 (*)    MCHC 29.9 (*)     RDW 17.4 (*)    All other components within normal limits  CBG MONITORING, ED - Abnormal; Notable for the following components:   Glucose-Capillary 283 (*)    All other components within normal limits  HCG, SERUM, QUALITATIVE  CBG MONITORING, ED    EKG: None  Radiology: CT Cervical Spine Wo Contrast Result Date: 09/13/2023 CLINICAL DATA:  Unwitnessed seizure. EXAM: CT CERVICAL SPINE WITHOUT CONTRAST TECHNIQUE: Multidetector CT imaging of the cervical spine was performed without intravenous contrast. Multiplanar CT image reconstructions were also generated. RADIATION DOSE REDUCTION: This exam was performed according to the departmental dose-optimization program which includes automated exposure control, adjustment of the mA and/or kV according to patient size and/or use of iterative reconstruction technique. COMPARISON:  None Available. FINDINGS: Alignment: There is reversal of the normal cervical spine lordosis. Skull base and vertebrae: No acute fracture. No primary bone lesion or focal pathologic process. Soft tissues and spinal canal: No prevertebral fluid or swelling. No visible canal hematoma. Disc levels: Normal multilevel endplates are seen with normal multilevel intervertebral disc spaces. Normal bilateral multilevel facet joints are noted. Upper chest: Negative. Other: None. IMPRESSION: No acute fracture or subluxation in the cervical spine. Electronically Signed   By: Suzen Dials M.D.   On: 09/13/2023 12:33   CT Head Wo Contrast Result Date: 09/13/2023 CLINICAL DATA:  Unwitnessed seizure. EXAM: CT HEAD WITHOUT CONTRAST TECHNIQUE: Contiguous axial images were obtained from the base of the skull through the vertex without intravenous contrast. RADIATION DOSE REDUCTION: This exam was performed according to the departmental dose-optimization program which includes automated exposure control, adjustment of the mA and/or kV according to patient size and/or use of iterative  reconstruction technique. COMPARISON:  April 09, 2023 FINDINGS: Brain: No evidence of acute infarction, hemorrhage, hydrocephalus, extra-axial collection or mass lesion/mass effect. Vascular: No hyperdense vessel or unexpected calcification. Skull: Normal. Negative for fracture or focal lesion. Sinuses/Orbits: No acute finding. Other: None. IMPRESSION: No acute intracranial pathology. Electronically Signed   By: Suzen Dials M.D.   On: 09/13/2023 12:28     Procedures   Medications Ordered in the ED - No data to display                                  Medical Decision Making Cardiac monitor interpretation: Sinus rhythm, no ectopy  Social determinants of health: Patient has a history of homelessness  Patient here for an episode of low blood sugar.  EMS gave her glucose and improved.  She has a history of diabetes and a history of this in the past.  Other labs are unremarkable.  Vitals are stable she is feeling much better at this time.  Advised to stay on her medications as prescribed, check her glucose frequently and follow-up with primary care.  Advised to return to the ER for new or worsening symptoms.  She feels comfortable to plan to be discharged home.  Problems Addressed: Hypoglycemia: acute illness or injury  Amount and/or Complexity of Data Reviewed External Data Reviewed: notes.    Details: Prior ED records reviewed and patient has a history of diabetes and hypoglycemia Labs: ordered. Decision-making details documented in ED Course.    Details: Ordered and reviewed by me and labs are unremarkable Radiology: ordered and independent interpretation performed. Decision-making details documented in ED Course.    Details: Ordered and interpreted by me independently of radiology CT head: Shows no acute abnormality CT C-spine: Shows no acute bony abnormality ECG/medicine tests: ordered and independent interpretation performed. Decision-making details documented in ED Course.     Details: Ordered and interpreted by me in the absence of cardiology and shows sinus rhythm, no STEMI or acute change when compared to prior  Risk OTC drugs. Prescription drug management. Diagnosis or treatment significantly limited by social determinants of health.     Final diagnoses:  Hypoglycemia    ED Discharge Orders     None          Gennaro Duwaine CROME, DO 09/13/23 2316

## 2023-09-13 NOTE — Discharge Instructions (Signed)
 Follow-up with your primary care doctor.  Stay on your medications as prescribed.  Return to the ER for new or worsening symptoms.

## 2023-09-13 NOTE — ED Triage Notes (Signed)
 Pt from home and said she had an unwitnessed seizure, EMS states BS was 50, EMS gave 1 packet of oral glucose.  Pt woke up on floor. Pt arrives in C-collar. EM States vomit on floor. Pt did ambulate on scene. Axox4. Hx of same.

## 2024-01-30 ENCOUNTER — Encounter (HOSPITAL_COMMUNITY): Payer: Self-pay

## 2024-01-30 ENCOUNTER — Emergency Department (HOSPITAL_COMMUNITY): Payer: MEDICAID

## 2024-01-30 ENCOUNTER — Other Ambulatory Visit: Payer: Self-pay

## 2024-01-30 ENCOUNTER — Inpatient Hospital Stay (HOSPITAL_COMMUNITY)
Admission: EM | Admit: 2024-01-30 | Discharge: 2024-02-01 | DRG: 638 | Disposition: A | Discharge status: Home / Self Care | Payer: MEDICAID | Attending: Internal Medicine | Admitting: Internal Medicine

## 2024-01-30 DIAGNOSIS — Z794 Long term (current) use of insulin: Secondary | ICD-10-CM

## 2024-01-30 DIAGNOSIS — E1010 Type 1 diabetes mellitus with ketoacidosis without coma: Principal | ICD-10-CM | POA: Diagnosis present

## 2024-01-30 DIAGNOSIS — R1084 Generalized abdominal pain: Secondary | ICD-10-CM | POA: Diagnosis not present

## 2024-01-30 DIAGNOSIS — K209 Esophagitis, unspecified without bleeding: Secondary | ICD-10-CM | POA: Diagnosis present

## 2024-01-30 DIAGNOSIS — I160 Hypertensive urgency: Secondary | ICD-10-CM | POA: Diagnosis not present

## 2024-01-30 DIAGNOSIS — Z888 Allergy status to other drugs, medicaments and biological substances status: Secondary | ICD-10-CM

## 2024-01-30 DIAGNOSIS — J45909 Unspecified asthma, uncomplicated: Secondary | ICD-10-CM | POA: Diagnosis not present

## 2024-01-30 DIAGNOSIS — I10 Essential (primary) hypertension: Secondary | ICD-10-CM | POA: Diagnosis present

## 2024-01-30 DIAGNOSIS — Z79899 Other long term (current) drug therapy: Secondary | ICD-10-CM

## 2024-01-30 DIAGNOSIS — F320 Major depressive disorder, single episode, mild: Secondary | ICD-10-CM | POA: Diagnosis present

## 2024-01-30 DIAGNOSIS — Z825 Family history of asthma and other chronic lower respiratory diseases: Secondary | ICD-10-CM

## 2024-01-30 DIAGNOSIS — E860 Dehydration: Secondary | ICD-10-CM | POA: Diagnosis present

## 2024-01-30 DIAGNOSIS — Z91040 Latex allergy status: Secondary | ICD-10-CM

## 2024-01-30 DIAGNOSIS — R112 Nausea with vomiting, unspecified: Secondary | ICD-10-CM | POA: Diagnosis not present

## 2024-01-30 DIAGNOSIS — Z91013 Allergy to seafood: Secondary | ICD-10-CM

## 2024-01-30 DIAGNOSIS — Z8249 Family history of ischemic heart disease and other diseases of the circulatory system: Secondary | ICD-10-CM

## 2024-01-30 DIAGNOSIS — E1110 Type 2 diabetes mellitus with ketoacidosis without coma: Secondary | ICD-10-CM | POA: Diagnosis present

## 2024-01-30 DIAGNOSIS — Z833 Family history of diabetes mellitus: Secondary | ICD-10-CM

## 2024-01-30 DIAGNOSIS — F909 Attention-deficit hyperactivity disorder, unspecified type: Secondary | ICD-10-CM | POA: Diagnosis present

## 2024-01-30 DIAGNOSIS — G43011 Migraine without aura, intractable, with status migrainosus: Secondary | ICD-10-CM | POA: Diagnosis present

## 2024-01-30 DIAGNOSIS — Z9641 Presence of insulin pump (external) (internal): Secondary | ICD-10-CM | POA: Diagnosis present

## 2024-01-30 LAB — COMPREHENSIVE METABOLIC PANEL WITH GFR
ALT: 22 U/L (ref 0–44)
AST: 27 U/L (ref 15–41)
Albumin: 4.6 g/dL (ref 3.5–5.0)
Alkaline Phosphatase: 195 U/L — ABNORMAL HIGH (ref 38–126)
Anion gap: 32 — ABNORMAL HIGH (ref 5–15)
BUN: 38 mg/dL — ABNORMAL HIGH (ref 6–20)
CO2: 8 mmol/L — ABNORMAL LOW (ref 22–32)
Calcium: 10.6 mg/dL — ABNORMAL HIGH (ref 8.9–10.3)
Chloride: 93 mmol/L — ABNORMAL LOW (ref 98–111)
Creatinine, Ser: 1.15 mg/dL — ABNORMAL HIGH (ref 0.44–1.00)
GFR, Estimated: >60 mL/min
Glucose, Bld: 840 mg/dL (ref 70–99)
Potassium: 5.2 mmol/L — ABNORMAL HIGH (ref 3.5–5.1)
Sodium: 133 mmol/L — ABNORMAL LOW (ref 135–145)
Total Bilirubin: 0.3 mg/dL (ref 0.0–1.2)
Total Protein: 9.4 g/dL — ABNORMAL HIGH (ref 6.5–8.1)

## 2024-01-30 LAB — URINALYSIS, ROUTINE W REFLEX MICROSCOPIC
Bacteria, UA: NONE SEEN
Bilirubin Urine: NEGATIVE
Glucose, UA: >=500 mg/dL — AB
Ketones, ur: 80 mg/dL — AB
Leukocytes,Ua: NEGATIVE
Nitrite: NEGATIVE
Protein, ur: 100 mg/dL — AB
Specific Gravity, Urine: 1.02 (ref 1.005–1.030)
pH: 5 (ref 5.0–8.0)

## 2024-01-30 LAB — BLOOD GAS, VENOUS
Acid-base deficit: 17.8 mmol/L — ABNORMAL HIGH (ref 0.0–2.0)
Bicarbonate: 9.9 mmol/L — ABNORMAL LOW (ref 20.0–28.0)
O2 Saturation: 79.9 %
Patient temperature: 37
pCO2, Ven: 29 mmHg — ABNORMAL LOW (ref 44–60)
pH, Ven: 7.14 — CL (ref 7.25–7.43)
pO2, Ven: 54 mmHg — ABNORMAL HIGH (ref 32–45)

## 2024-01-30 LAB — GLUCOSE, CAPILLARY
Glucose-Capillary: 219 mg/dL — ABNORMAL HIGH (ref 70–99)
Glucose-Capillary: 245 mg/dL — ABNORMAL HIGH (ref 70–99)
Glucose-Capillary: 266 mg/dL — ABNORMAL HIGH (ref 70–99)
Glucose-Capillary: 276 mg/dL — ABNORMAL HIGH (ref 70–99)

## 2024-01-30 LAB — CBC WITH DIFFERENTIAL/PLATELET
Abs Immature Granulocytes: 0.06 10*3/uL (ref 0.00–0.07)
Basophils Absolute: 0.1 10*3/uL (ref 0.0–0.1)
Basophils Relative: 1 %
Eosinophils Absolute: 0 10*3/uL (ref 0.0–0.5)
Eosinophils Relative: 0 %
HCT: 32.8 % — ABNORMAL LOW (ref 36.0–46.0)
Hemoglobin: 9.6 g/dL — ABNORMAL LOW (ref 12.0–15.0)
Immature Granulocytes: 1 %
Lymphocytes Relative: 16 %
Lymphs Abs: 1.7 10*3/uL (ref 0.7–4.0)
MCH: 25.1 pg — ABNORMAL LOW (ref 26.0–34.0)
MCHC: 29.3 g/dL — ABNORMAL LOW (ref 30.0–36.0)
MCV: 85.6 fL (ref 80.0–100.0)
Monocytes Absolute: 0.2 10*3/uL (ref 0.1–1.0)
Monocytes Relative: 2 %
Neutro Abs: 8.3 10*3/uL — ABNORMAL HIGH (ref 1.7–7.7)
Neutrophils Relative %: 80 %
Platelets: 464 10*3/uL — ABNORMAL HIGH (ref 150–400)
RBC: 3.83 MIL/uL — ABNORMAL LOW (ref 3.87–5.11)
RDW: 16.6 % — ABNORMAL HIGH (ref 11.5–15.5)
WBC: 10.2 10*3/uL (ref 4.0–10.5)
nRBC: 0 % (ref 0.0–0.2)

## 2024-01-30 LAB — I-STAT CHEM 8, ED
BUN: 40 mg/dL — ABNORMAL HIGH (ref 6–20)
Calcium, Ion: 1.26 mmol/L (ref 1.15–1.40)
Chloride: 107 mmol/L (ref 98–111)
Creatinine, Ser: 0.8 mg/dL (ref 0.44–1.00)
Glucose, Bld: >700 mg/dL (ref 70–99)
HCT: 34 % — ABNORMAL LOW (ref 36.0–46.0)
Hemoglobin: 11.6 g/dL — ABNORMAL LOW (ref 12.0–15.0)
Potassium: 5.2 mmol/L — ABNORMAL HIGH (ref 3.5–5.1)
Sodium: 134 mmol/L — ABNORMAL LOW (ref 135–145)
TCO2: 11 mmol/L — ABNORMAL LOW (ref 22–32)

## 2024-01-30 LAB — BETA-HYDROXYBUTYRIC ACID: Beta-Hydroxybutyric Acid: >8 mmol/L — ABNORMAL HIGH (ref 0.05–0.27)

## 2024-01-30 LAB — TROPONIN T, HIGH SENSITIVITY: Troponin T High Sensitivity: 7 ng/L (ref 0–19)

## 2024-01-30 LAB — HCG, SERUM, QUALITATIVE: Preg, Serum: NEGATIVE

## 2024-01-30 LAB — CBG MONITORING, ED
Glucose-Capillary: >600 mg/dL (ref 70–99)
Glucose-Capillary: 481 mg/dL — ABNORMAL HIGH (ref 70–99)
Glucose-Capillary: >600 mg/dL (ref 70–99)
Glucose-Capillary: 406 mg/dL — ABNORMAL HIGH (ref 70–99)
Glucose-Capillary: 355 mg/dL — ABNORMAL HIGH (ref 70–99)

## 2024-01-30 LAB — MAGNESIUM: Magnesium: 2.8 mg/dL — ABNORMAL HIGH (ref 1.7–2.4)

## 2024-01-30 LAB — LIPASE, BLOOD: Lipase: 82 U/L — ABNORMAL HIGH (ref 11–51)

## 2024-01-30 LAB — PHOSPHORUS: Phosphorus: 7.1 mg/dL — ABNORMAL HIGH (ref 2.5–4.6)

## 2024-01-30 LAB — MRSA NEXT GEN BY PCR, NASAL: MRSA by PCR Next Gen: NOT DETECTED

## 2024-01-30 MED ORDER — LABETALOL HCL 5 MG/ML IV SOLN
20.0000 mg | Freq: Once | INTRAVENOUS | Status: AC
  Administered 2024-01-30: 20 mg via INTRAVENOUS
  Filled 2024-01-30: qty 4

## 2024-01-30 MED ORDER — ORAL CARE MOUTH RINSE: 15.0000 mL | OROMUCOSAL | Status: DC | PRN

## 2024-01-30 MED ORDER — ONDANSETRON HCL 4 MG/2ML IJ SOLN
4.0000 mg | Freq: Four times a day (QID) | INTRAMUSCULAR | Status: DC | PRN
  Administered 2024-01-30 – 2024-01-31 (×2): 4 mg via INTRAVENOUS
  Filled 2024-01-30 (×2): qty 2

## 2024-01-30 MED ORDER — INSULIN REGULAR(HUMAN) IN NACL 100-0.9 UT/100ML-% IV SOLN
INTRAVENOUS | Status: DC
  Administered 2024-01-30: 8 [IU]/h via INTRAVENOUS
  Filled 2024-01-30: qty 100

## 2024-01-30 MED ORDER — DEXTROSE 50 % IV SOLN: 0.0000 mL | INTRAVENOUS | Status: DC | PRN

## 2024-01-30 MED ORDER — HYDROMORPHONE HCL 1 MG/ML IJ SOLN
1.0000 mg | Freq: Four times a day (QID) | INTRAMUSCULAR | Status: DC | PRN
  Administered 2024-01-31: 1 mg via INTRAVENOUS
  Filled 2024-01-30: qty 1

## 2024-01-30 MED ORDER — ONDANSETRON HCL 4 MG PO TABS: 4.0000 mg | ORAL_TABLET | Freq: Four times a day (QID) | ORAL | Status: DC | PRN

## 2024-01-30 MED ORDER — ACETAMINOPHEN 325 MG PO TABS: 650.0000 mg | ORAL_TABLET | Freq: Four times a day (QID) | ORAL | Status: DC | PRN

## 2024-01-30 MED ORDER — OXYCODONE HCL 5 MG PO TABS
5.0000 mg | ORAL_TABLET | Freq: Four times a day (QID) | ORAL | Status: DC | PRN
  Administered 2024-01-30: 5 mg via ORAL
  Filled 2024-01-30: qty 1

## 2024-01-30 MED ORDER — SENNOSIDES-DOCUSATE SODIUM 8.6-50 MG PO TABS: 1.0000 | ORAL_TABLET | Freq: Every evening | ORAL | Status: DC | PRN

## 2024-01-30 MED ORDER — IOHEXOL 300 MG/ML  SOLN
80.0000 mL | Freq: Once | INTRAMUSCULAR | Status: AC | PRN
  Administered 2024-01-30: 80 mL via INTRAVENOUS

## 2024-01-30 MED ORDER — ACETAMINOPHEN 650 MG RE SUPP: 650.0000 mg | Freq: Four times a day (QID) | RECTAL | Status: DC | PRN

## 2024-01-30 MED ORDER — LACTATED RINGERS IV BOLUS
1000.0000 mL | Freq: Once | INTRAVENOUS | Status: AC
  Administered 2024-01-31: 1000 mL via INTRAVENOUS

## 2024-01-30 MED ORDER — ONDANSETRON HCL 4 MG/2ML IJ SOLN
4.0000 mg | Freq: Once | INTRAMUSCULAR | Status: AC
  Administered 2024-01-30: 4 mg via INTRAVENOUS
  Filled 2024-01-30: qty 2

## 2024-01-30 MED ORDER — ONDANSETRON HCL 4 MG/2ML IJ SOLN: 4.0000 mg | Freq: Once | INTRAMUSCULAR | Status: DC

## 2024-01-30 MED ORDER — CHLORHEXIDINE GLUCONATE CLOTH 2 % EX PADS
6.0000 | MEDICATED_PAD | Freq: Every day | CUTANEOUS | Status: DC
  Administered 2024-01-30: 6 via TOPICAL

## 2024-01-30 MED ORDER — HYDRALAZINE HCL 20 MG/ML IJ SOLN
10.0000 mg | Freq: Four times a day (QID) | INTRAMUSCULAR | Status: DC | PRN
  Administered 2024-01-30 – 2024-01-31 (×3): 10 mg via INTRAVENOUS
  Filled 2024-01-30 (×4): qty 1

## 2024-01-30 MED ORDER — LACTATED RINGERS IV BOLUS
1000.0000 mL | Freq: Once | INTRAVENOUS | Status: AC
  Administered 2024-01-30: 1000 mL via INTRAVENOUS

## 2024-01-30 MED ORDER — PANTOPRAZOLE SODIUM 40 MG IV SOLR
40.0000 mg | Freq: Once | INTRAVENOUS | Status: AC
  Administered 2024-01-30: 40 mg via INTRAVENOUS
  Filled 2024-01-30: qty 10

## 2024-01-30 MED ORDER — HYDROMORPHONE HCL 1 MG/ML IJ SOLN
0.5000 mg | Freq: Four times a day (QID) | INTRAMUSCULAR | Status: DC | PRN
  Administered 2024-01-30: 0.5 mg via INTRAVENOUS
  Filled 2024-01-30: qty 1

## 2024-01-30 MED ORDER — HYDROMORPHONE HCL 1 MG/ML IJ SOLN: 0.5000 mg | Freq: Four times a day (QID) | INTRAMUSCULAR | Status: DC | PRN

## 2024-01-30 MED ORDER — DEXTROSE IN LACTATED RINGERS 5 % IV SOLN: INTRAVENOUS | Status: DC

## 2024-01-30 MED ORDER — LACTATED RINGERS IV BOLUS
20.0000 mL/kg | Freq: Once | INTRAVENOUS | Status: AC
  Administered 2024-01-30: 1100 mL via INTRAVENOUS

## 2024-01-30 MED ORDER — LACTATED RINGERS IV SOLN: INTRAVENOUS | Status: AC

## 2024-01-30 MED ORDER — SODIUM BICARBONATE 8.4 % IV SOLN
50.0000 meq | Freq: Once | INTRAVENOUS | Status: AC
  Administered 2024-01-30: 50 meq via INTRAVENOUS
  Filled 2024-01-30: qty 50

## 2024-01-30 MED ORDER — ENOXAPARIN SODIUM 40 MG/0.4ML IJ SOSY
40.0000 mg | PREFILLED_SYRINGE | INTRAMUSCULAR | Status: DC
  Administered 2024-01-30 – 2024-01-31 (×2): 40 mg via SUBCUTANEOUS
  Filled 2024-01-30 (×2): qty 0.4

## 2024-01-30 MED ORDER — BISACODYL 5 MG PO TBEC: 5.0000 mg | DELAYED_RELEASE_TABLET | Freq: Every day | ORAL | Status: DC | PRN

## 2024-01-30 MED ORDER — SODIUM CHLORIDE 0.9 % IV SOLN
12.5000 mg | Freq: Once | INTRAVENOUS | Status: AC
  Administered 2024-01-31: 12.5 mg via INTRAVENOUS
  Filled 2024-01-30: qty 12.5

## 2024-01-30 NOTE — ED Triage Notes (Addendum)
 Patient BIB [facility] from home. Has had high blood sugar since last night along with nausea and vomiting all day. Type 1 diabetic. Stated she takes her insulin . Released 3 weeks ago for DKA. CBG read HIGH for EMS.

## 2024-01-30 NOTE — ED Provider Notes (Signed)
 " [facility] AT [facility] Provider Note   CSN: [id number] Arrival date & time: 01/30/24  1318     Patient presents with: Hyperglycemia   Dawn [name] is a 24 y.o. female who presents to the emergency department via EMS with a chief complaint of chest pain, hyperglycemia, nausea, as well as vomiting and abdominal pain.  Patient is a type I diabetic and normally uses an insulin  pump.  Patient states that she was compliant with her insulin  pump until yesterday.  She states that her insulin  pump needed to be reloaded but unfortunately she does not have any refills currently.  Patient was released 3 weeks ago for DKA by [facility] per chart review.  Has a history of poorly controlled type 1 diabetes, hypokalemia, noncompliance with diabetes treatment, asthma, history of migraines, ADHD, pelvic pain, major depressive disorder, etc.    Hyperglycemia Associated symptoms: nausea        Prior to Admission medications  Medication Sig Start Date End Date Taking? Authorizing Provider  cetirizine  (ZYRTEC  ALLERGY) 10 MG tablet Take 1 tablet (10 mg total) by mouth at bedtime. 08/27/23 11/25/23  Joesph Shaver Scales, PA-C  Continuous Glucose Sensor (DEXCOM G7 SENSOR) MISC Inject 1 Application into the skin as directed. 10/30/22   Diona Perkins, MD  Glucagon  (BAQSIMI  TWO PACK) 3 MG/DOSE POWD Place 3 mg into the nose as needed (low blood sugar).    [provider]  insulin  aspart (NOVOLOG ) 100 UNIT/ML injection Inject 4 Units into the skin 3 (three) times daily with meals. If eating > 50% of meals 08/04/23   [place], Camellia PARAS, DO  Insulin  Disposable Pump (OMNIPOD 5 DEXG7G6 INTRO GEN 5) KIT Use as prescribed 07/28/23   Henderly, Britni A, PA-C  insulin  glargine-yfgn (SEMGLEE ) 100 UNIT/ML injection Inject 0.2 mLs (20 Units total) into the skin daily. 08/04/23   [place], Camellia PARAS, DO  Insulin  Pen Needle 32G X 4 MM MISC Use as directed with insulin . 04/11/23   Amoako, Prince, MD   montelukast  (SINGULAIR ) 10 MG tablet Take 1 tablet (10 mg total) by mouth at bedtime. 08/27/23 11/25/23  Joesph Shaver Scales, PA-C    Allergies: Latex, Shellfish allergy, and Tape    Review of Systems  Gastrointestinal:  Positive for nausea.    Updated Vital Signs BP (!) 178/117   Pulse (!) 137   Temp 97.9 F (36.6 C) (Oral)   Resp 16   Ht 5' 1 (1.549 m)   Wt 50.2 kg   SpO2 100%   BMI 20.91 kg/m   Physical Exam Vitals and nursing note reviewed.  Constitutional:      General: She is awake. She is not in acute distress.    Appearance: Normal appearance. She is ill-appearing. She is not diaphoretic.     Comments: Uncomfortable appearing  HENT:     Head: Normocephalic and atraumatic.     Mouth/Throat:     Mouth: Mucous membranes are dry.  Eyes:     General: No scleral icterus.    Conjunctiva/sclera: Conjunctivae normal.  Cardiovascular:     Rate and Rhythm: Regular rhythm. Tachycardia present.  Pulmonary:     Effort: Pulmonary effort is normal. No respiratory distress.     Breath sounds: Normal breath sounds. No stridor. No wheezing, rhonchi or rales.  Abdominal:     General: Abdomen is flat. There is no distension.     Palpations: Abdomen is soft.     Tenderness: There is abdominal tenderness (generalized tenderness present  with palpation). There is no right CVA tenderness, left CVA tenderness or guarding.  Musculoskeletal:        General: Normal range of motion.     Cervical back: Normal range of motion.     Right lower leg: No edema.     Left lower leg: No edema.     Comments: Grossly normal ROM of all 4 extremities  Skin:    General: Skin is warm.     Capillary Refill: Capillary refill takes less than 2 seconds.     Comments: No obvious rashes   Neurological:     General: No focal deficit present.     Mental Status: She is alert and oriented to person, place, and time.  Psychiatric:        Mood and Affect: Mood normal.        Behavior: Behavior normal.  Behavior is cooperative.     (all labs ordered are listed, but only abnormal results are displayed) Labs Reviewed  CBC WITH DIFFERENTIAL/PLATELET - Abnormal; Notable for the following components:      Result Value   RBC 3.83 (*)    Hemoglobin 9.6 (*)    HCT 32.8 (*)    MCH 25.1 (*)    MCHC 29.3 (*)    RDW 16.6 (*)    Platelets 464 (*)    Neutro Abs 8.3 (*)    All other components within normal limits  COMPREHENSIVE METABOLIC PANEL WITH GFR - Abnormal; Notable for the following components:   Sodium 133 (*)    Potassium 5.2 (*)    Chloride 93 (*)    CO2 8 (*)    Glucose, Bld 840 (*)    BUN 38 (*)    Creatinine, Ser 1.15 (*)    Calcium  10.6 (*)    Total Protein 9.4 (*)    Alkaline Phosphatase 195 (*)    Anion gap 32 (*)    All other components within normal limits  BETA-HYDROXYBUTYRIC ACID - Abnormal; Notable for the following components:   Beta-Hydroxybutyric Acid >8.00 (*)    All other components within normal limits  LIPASE, BLOOD - Abnormal; Notable for the following components:   Lipase 82 (*)    All other components within normal limits  URINALYSIS, ROUTINE W REFLEX MICROSCOPIC - Abnormal; Notable for the following components:   Color, Urine COLORLESS (*)    Glucose, UA >=500 (*)    Hgb urine dipstick SMALL (*)    Ketones, ur 80 (*)    Protein, ur 100 (*)    All other components within normal limits  MAGNESIUM  - Abnormal; Notable for the following components:   Magnesium  2.8 (*)    All other components within normal limits  PHOSPHORUS - Abnormal; Notable for the following components:   Phosphorus 7.1 (*)    All other components within normal limits  BLOOD GAS, VENOUS - Abnormal; Notable for the following components:   pH, Ven 7.14 (*)    pCO2, Ven 29 (*)    pO2, Ven 54 (*)    Bicarbonate 9.9 (*)    Acid-base deficit 17.8 (*)    All other components within normal limits  GLUCOSE, CAPILLARY - Abnormal; Notable for the following components:   Glucose-Capillary  276 (*)    All other components within normal limits  GLUCOSE, CAPILLARY - Abnormal; Notable for the following components:   Glucose-Capillary 219 (*)    All other components within normal limits  CBG MONITORING, ED - Abnormal; Notable for the  following components:   Glucose-Capillary >600 (*)    All other components within normal limits  I-STAT CHEM 8, ED - Abnormal; Notable for the following components:   Sodium 134 (*)    Potassium 5.2 (*)    BUN 40 (*)    Glucose, Bld >700 (*)    TCO2 11 (*)    Hemoglobin 11.6 (*)    HCT 34.0 (*)    All other components within normal limits  CBG MONITORING, ED - Abnormal; Notable for the following components:   Glucose-Capillary >600 (*)    All other components within normal limits  CBG MONITORING, ED - Abnormal; Notable for the following components:   Glucose-Capillary 481 (*)    All other components within normal limits  CBG MONITORING, ED - Abnormal; Notable for the following components:   Glucose-Capillary 406 (*)    All other components within normal limits  CBG MONITORING, ED - Abnormal; Notable for the following components:   Glucose-Capillary 355 (*)    All other components within normal limits  MRSA NEXT GEN BY PCR, NASAL  HCG, SERUM, QUALITATIVE  BASIC METABOLIC PANEL WITH GFR  CBC  I-STAT VENOUS BLOOD GAS, ED  TROPONIN T, HIGH SENSITIVITY    EKG: None  Radiology: CT ABDOMEN PELVIS W CONTRAST Result Date: 01/30/2024 EXAM: CT ABDOMEN AND PELVIS WITH CONTRAST 01/30/2024 05:23:42 PM TECHNIQUE: CT of the abdomen and pelvis was performed with the administration of intravenous contrast. Multiplanar reformatted images are provided for review. Automated exposure control, iterative reconstruction, and/or weight-based adjustment of the mA/kV was utilized to reduce the radiation dose to as low as reasonably achievable. COMPARISON: 05/15/2022. CLINICAL HISTORY: Generalized abdominal pain, elevated lipase and alkaline phosphatase,  nausea/vomiting, diabetic ketoacidosis. FINDINGS: LOWER CHEST: Moderate circumferential wall thickening of the distal esophagus. LIVER: The liver is unremarkable. GALLBLADDER AND BILE DUCTS: Gallbladder is unremarkable. No biliary ductal dilatation. SPLEEN: No acute abnormality. PANCREAS: No acute abnormality. ADRENAL GLANDS: No acute abnormality. KIDNEYS, URETERS AND BLADDER: No stones in the kidneys or ureters. No hydronephrosis. No perinephric or periureteral stranding. The urinary bladder is distended without focal abnormality. GI AND BOWEL: Stomach demonstrates no acute abnormality. There is no bowel obstruction. PERITONEUM AND RETROPERITONEUM: No ascites. No free air. No free pelvic fluid. VASCULATURE: Aorta is normal in caliber. LYMPH NODES: No lymphadenopathy. REPRODUCTIVE ORGANS: Age appropriate appearance of the uterus and ovaries. BONES AND SOFT TISSUES: No acute osseous abnormality. Umbilical adornment. IMPRESSION: 1. Moderate circumferential wall thickening of the distal esophagus, worrisome for an infectious or inflammatory esophagitis. Nonemergent upper endoscopy may be of benefit for further characterization. Electronically signed by: Rogelia Myers MD 01/30/2024 06:01 PM EST RP Workstation: [id number]   DG Chest 2 View Result Date: 01/30/2024 EXAM: 2 VIEW(S) XRAY OF THE CHEST 01/30/2024 02:49:00 PM COMPARISON: 08/02/2023 CLINICAL HISTORY: Shortness of breath, chest pain. FINDINGS: LUNGS AND PLEURA: No focal pulmonary opacity. No pleural effusion. No pneumothorax. HEART AND MEDIASTINUM: No acute abnormality of the cardiac and mediastinal silhouettes. BONES AND SOFT TISSUES: No acute osseous abnormality. IMPRESSION: 1. No acute cardiopulmonary pathology. Electronically signed by: Norleen Boxer MD 01/30/2024 03:25 PM EST RP Workstation: [id number]     .Critical Care  Performed by: Janetta Terrall FALCON, PA-C Authorized by: Janetta Terrall FALCON, PA-C   Critical care provider statement:    Critical  care time (minutes):  30   Critical care was necessary to treat or prevent imminent or life-threatening deterioration of the following conditions:  Endocrine crisis   Critical care was time spent  personally by me on the following activities:  Blood draw for specimens, development of treatment plan with patient or surrogate, evaluation of patient's response to treatment, examination of patient, interpretation of cardiac output measurements, obtaining history from patient or surrogate, ordering and performing treatments and interventions, ordering and review of laboratory studies, ordering and review of radiographic studies, re-evaluation of patient's condition and review of old charts   Care discussed with comment:  Patient will ultimatley be admitted    Medications Ordered in the ED  insulin  regular, human (MYXREDLIN ) 100 units/ 100 mL infusion (5.5 Units/hr Intravenous Infusion Verify 01/30/24 2000)  lactated ringers  infusion ( Intravenous Infusion Verify 01/30/24 2000)  dextrose  5 % in lactated ringers  infusion ( Intravenous New Bag/Given 01/30/24 2123)  dextrose  50 % solution 0-50 mL (has no administration in time range)  ondansetron  (ZOFRAN ) injection 4 mg (4 mg Intravenous Not Given 01/30/24 1829)  acetaminophen  (TYLENOL ) tablet 650 mg (has no administration in time range)    Or  acetaminophen  (TYLENOL ) suppository 650 mg (has no administration in time range)  senna-docusate (Senokot-S) tablet 1 tablet (has no administration in time range)  bisacodyl  (DULCOLAX) EC tablet 5 mg (has no administration in time range)  ondansetron  (ZOFRAN ) tablet 4 mg ( Oral See Alternative 01/30/24 2004)    Or  ondansetron  (ZOFRAN ) injection 4 mg (4 mg Intravenous Given 01/30/24 2004)  enoxaparin  (LOVENOX ) injection 40 mg (40 mg Subcutaneous Given 01/30/24 2208)  hydrALAZINE  (APRESOLINE ) injection 10 mg (10 mg Intravenous Given 01/30/24 2000)  HYDROmorphone  (DILAUDID ) injection 0.5 mg (0.5 mg Intravenous Given 01/30/24  2024)  Oral care mouth rinse (has no administration in time range)  Chlorhexidine  Gluconate Cloth 2 % PADS 6 each (6 each Topical Given 01/30/24 2042)  lactated ringers  bolus 1,000 mL (0 mLs Intravenous Stopped 01/30/24 1545)  ondansetron  (ZOFRAN ) injection 4 mg (4 mg Intravenous Given 01/30/24 1415)  lactated ringers  bolus 1,100 mL (0 mLs Intravenous Stopped 01/30/24 1801)  iohexol  (OMNIPAQUE ) 300 MG/ML solution 80 mL (80 mLs Intravenous Contrast Given 01/30/24 1710)  pantoprazole  (PROTONIX ) injection 40 mg (40 mg Intravenous Given 01/30/24 1848)  sodium bicarbonate  injection 50 mEq (50 mEq Intravenous Given 01/30/24 1848)                                    Medical Decision Making Amount and/or Complexity of Data Reviewed Labs: ordered. Radiology: ordered.  Risk Prescription drug management. Decision regarding hospitalization.   Patient presents to the ED for concern of hypoglycemia, nausea, vomiting, abdominal pain, chest pain, this involves an extensive number of treatment options, and is a complaint that carries with it a high risk of complications and morbidity.  The differential diagnosis includes DKA, HHS, hyperglycemia, gastritis, pancreatitis, cholecystitis, diverticulitis, ACS, etc.   Co morbidities that complicate the patient evaluation  poorly controlled type 1 diabetes, hypokalemia, noncompliance with diabetes treatment, asthma, history of migraines, ADHD, pelvic pain, major depressive disorder   Additional history obtained:  More history obtained from most recent visit at [facility] where patient was seen for DKA   Lab Tests:  I Ordered, and personally interpreted labs.  The pertinent results include: No leukocytosis, hemoglobin 9.6, mild electrolyte disturbances including hyponatremia, hyperkalemia, glucose of 840, creatinine 1.15, pH of 7.14, magnesium  2.8, lipase 82, phosphorus 7.1, urinalysis not consistent infection   Imaging Studies ordered:  I ordered imaging  studies including chest x-ray I independently visualized and interpreted imaging which showed  no acute cardiopulmonary abnormality I agree with the radiologist interpretation   Cardiac Monitoring:  The patient was maintained on a cardiac monitor.  I personally viewed and interpreted the cardiac monitored which showed an underlying rhythm of: Sinus tachycardia   Medicines ordered and prescription drug management:  I ordered medication including fluids, Zofran , insulin  drip for DKA, nausea, vomiting Reevaluation of the patient after these medicines showed that the patient improved I have reviewed the patients home medicines and have made adjustments as needed   Test Considered:  CT abdomen pelvis: Deferred at this time as patient recently had a CT abdomen pelvis completed less than a month ago, states she feels similarly when in DKA previously will wait for CMP to result   Critical Interventions:  DKA   Problem List / ED Course:  24 year old female, vital signs significant for mild tachycardia as well as hypertension, presents to the emergency department for chief complaint of nausea, vomiting, abdominal pain, as well as chest pain, patient is a type I diabetic and has a history of uncontrolled diabetes, recently in DKA 3 weeks ago at [facility] Additional CBG read greater than 600, given concern for DKA will obtain basic labs for further investigation, due to chest pain will also obtain chest x-ray as well as troponin and EKG, generalized abdominal pain on physical exam as well however patient recently had a CT abdomen pelvis less than a month ago which was significant for esophagitis/gastritis findings, will wait and see what labs result first prior to more imaging of abdomen On physical exam patient is alert and oriented however somewhat sleepy, patient was actively vomiting during exam however was able to participate in exam appropriately and sit up in bed Repeat CBG greater than 600,  i-STAT Chem-8 shows potassium of 5.2, creatinine of 0.8, at this time given high clinical concern for diabetic ketoacidosis will start patient on insulin  drip and fluids, patient treated symptomatically with Zofran  for ongoing nausea and vomiting with mild improvement Labs significant for no leukocytosis, hemoglobin of 9.6, CMP shows mild hyponatremia, potassium of 5.2, glucose of 840, creatinine of 1.15, anion gap of 32, magnesium  2.8, lipase 82, phosphorus 7.1, urinalysis not consistent with infection, VBG shows pH of 7.14, troponin 7, beta hydroxybutyric acid pending at this time Given presentation and history of uncontrolled type 1 diabetes, most likely diagnosis at this time is diabetic ketoacidosis without coma, patient signed out to oncoming EDP Rocky Hamilton who assumes care over patient, further work-up, and ultimately disposition.  Plan currently is for admission to the hospital for ongoing treatment of DKA given lab findings, currently just waiting on further labs to result and consult can be placed for medical admission  No sign of cardiac relation of chest pain giving reassuring EKG and troponin as well as CXR here today, chest pain could be due to gastritis/esophagitis or DKA in general   Reevaluation:  After the interventions noted above, I reevaluated the patient and found that they have :stayed the same   Social Determinants of Health:  Issues getting medications   Dispostion:  After consideration of the diagnostic results and the patients response to treatment, I feel that the patient would benefit from admission to the hospital for ongoing diagnosis and treatment.       Final diagnoses:  Diabetic ketoacidosis without coma associated with type 1 diabetes mellitus (HCC)  Nausea and vomiting, unspecified vomiting type    ED Discharge Orders     None  Janetta Terrall FALCON, [place] 01/30/24 2233  "
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
## 2024-01-30 NOTE — ED Notes (Signed)
"  Pt off the floor to CT   "

## 2024-01-30 NOTE — ED Provider Notes (Signed)
" °  Physical Exam   Vitals:   01/30/24 1326 01/30/24 1328 01/30/24 1645  BP:  (!) 185/122 (!) 186/122  Pulse:  (!) 111 (!) 121  Resp:  16 14  Temp:  98 F (36.7 C) (!) 97.3 F (36.3 C)  TempSrc:  Oral Oral  SpO2:  95% 100%  Weight: 55 kg    Height: 5' 1 (1.549 m)       Physical Exam  Procedures  Procedures  ED Course / MDM    Medical Decision Making Amount and/or Complexity of Data Reviewed Labs: ordered. Radiology: ordered.  Risk Prescription drug management. Decision regarding hospitalization.   Patient received at shift change from previous EDP Collin Hinnant PA-C, see their note for initial history/physical exam findings/lab interpretations/initial assessment/plan.  24 year old female presenting with complaint of nausea/vomiting/abdominal pain/chest pain.  Patient has a history of type 1 diabetes, uses an insulin  pump but ran out of refills yesterday and has not had any insulin  since then.  Patient was admitted at [facility] 12/28 for DKA. Initial findings suggest DKA, patient given multiple LR fluid boluses as well as antiemetics and started on Endo tool for correction of hyperglycemia  At time of my assessment, patient is no longer experiencing nausea/vomiting, however she does continue to complain of generalized abdominal pain.  She was found to have elevated lipase and alk phos, given this we will proceed with CT abdomen/pelvis  Pertinent labs: CBC notable for hemoglobin of 9.6 which is diminished compared to prior baseline of 11.2, but has been similarly low in the past with additional findings suggestive of microcytic anemia; thrombocytosis with platelet count of 464 Initial CBG high, greater than 600 VBG notable for acidosis with pH of 7.14, bicarb low at 9.9 with elevated acid base deficit Serum hCG negative Urinalysis with glucosuria and ketones/protein Initial troponin of 7, will not repeat Phosphorus elevated at 7.1 CMP with hyponatremia of second of 133,  hyperkalemia with potassium of 5.2, glucose is significantly elevated at 840, creatinine above baseline at 1.15, alk phos elevated at 195, anion gap significantly elevated at 32 Lipase minimally elevated at 82 Magnesium  mildly elevated, 2.8 Serum ketones significantly elevated, greater than 8  CXR: No acute cardiopulmonary pathology CT abdomen/pelvis: 1. Moderate circumferential wall thickening of the distal esophagus, worrisome for an infectious or inflammatory esophagitis. Nonemergent upper endoscopy may be of benefit for further characterization.  IV Protonix  given for suspected esophagitis, likely in the setting of recurrent bouts of vomiting  Given findings consistent with DKA as above, patient will likely benefit from admission.  I spoke with hospital service, Dr. Lou, who is in agreement with this plan.        Glendia Rocky SAILOR, [place] 01/30/24 [place]    Armenta Canning, MD 02/03/24 1916  "

## 2024-01-31 DIAGNOSIS — Z833 Family history of diabetes mellitus: Secondary | ICD-10-CM | POA: Diagnosis not present

## 2024-01-31 DIAGNOSIS — F320 Major depressive disorder, single episode, mild: Secondary | ICD-10-CM | POA: Diagnosis present

## 2024-01-31 DIAGNOSIS — Z825 Family history of asthma and other chronic lower respiratory diseases: Secondary | ICD-10-CM | POA: Diagnosis not present

## 2024-01-31 DIAGNOSIS — Z79899 Other long term (current) drug therapy: Secondary | ICD-10-CM | POA: Diagnosis not present

## 2024-01-31 DIAGNOSIS — Z91040 Latex allergy status: Secondary | ICD-10-CM | POA: Diagnosis not present

## 2024-01-31 DIAGNOSIS — R1084 Generalized abdominal pain: Secondary | ICD-10-CM

## 2024-01-31 DIAGNOSIS — Z8249 Family history of ischemic heart disease and other diseases of the circulatory system: Secondary | ICD-10-CM | POA: Diagnosis not present

## 2024-01-31 DIAGNOSIS — J45909 Unspecified asthma, uncomplicated: Secondary | ICD-10-CM

## 2024-01-31 DIAGNOSIS — G43011 Migraine without aura, intractable, with status migrainosus: Secondary | ICD-10-CM | POA: Diagnosis present

## 2024-01-31 DIAGNOSIS — I160 Hypertensive urgency: Secondary | ICD-10-CM | POA: Diagnosis present

## 2024-01-31 DIAGNOSIS — K209 Esophagitis, unspecified without bleeding: Secondary | ICD-10-CM

## 2024-01-31 DIAGNOSIS — Z794 Long term (current) use of insulin: Secondary | ICD-10-CM | POA: Diagnosis not present

## 2024-01-31 DIAGNOSIS — Z91013 Allergy to seafood: Secondary | ICD-10-CM | POA: Diagnosis not present

## 2024-01-31 DIAGNOSIS — Z888 Allergy status to other drugs, medicaments and biological substances status: Secondary | ICD-10-CM | POA: Diagnosis not present

## 2024-01-31 DIAGNOSIS — E860 Dehydration: Secondary | ICD-10-CM | POA: Diagnosis present

## 2024-01-31 DIAGNOSIS — I10 Essential (primary) hypertension: Secondary | ICD-10-CM | POA: Diagnosis present

## 2024-01-31 DIAGNOSIS — F909 Attention-deficit hyperactivity disorder, unspecified type: Secondary | ICD-10-CM | POA: Diagnosis present

## 2024-01-31 DIAGNOSIS — E1110 Type 2 diabetes mellitus with ketoacidosis without coma: Secondary | ICD-10-CM | POA: Diagnosis present

## 2024-01-31 DIAGNOSIS — Z9641 Presence of insulin pump (external) (internal): Secondary | ICD-10-CM | POA: Diagnosis present

## 2024-01-31 DIAGNOSIS — E1010 Type 1 diabetes mellitus with ketoacidosis without coma: Secondary | ICD-10-CM | POA: Diagnosis present

## 2024-01-31 LAB — BASIC METABOLIC PANEL WITH GFR
Anion gap: 27 — ABNORMAL HIGH (ref 5–15)
Anion gap: 17 — ABNORMAL HIGH (ref 5–15)
Anion gap: 14 (ref 5–15)
BUN: 33 mg/dL — ABNORMAL HIGH (ref 6–20)
BUN: 29 mg/dL — ABNORMAL HIGH (ref 6–20)
BUN: 27 mg/dL — ABNORMAL HIGH (ref 6–20)
CO2: 15 mmol/L — ABNORMAL LOW (ref 22–32)
CO2: 22 mmol/L (ref 22–32)
CO2: 24 mmol/L (ref 22–32)
Calcium: 10.8 mg/dL — ABNORMAL HIGH (ref 8.9–10.3)
Calcium: 9.9 mg/dL (ref 8.9–10.3)
Calcium: 9.4 mg/dL (ref 8.9–10.3)
Chloride: 104 mmol/L (ref 98–111)
Chloride: 111 mmol/L (ref 98–111)
Chloride: 111 mmol/L (ref 98–111)
Creatinine, Ser: 1.09 mg/dL — ABNORMAL HIGH (ref 0.44–1.00)
Creatinine, Ser: 1 mg/dL (ref 0.44–1.00)
Creatinine, Ser: 0.97 mg/dL (ref 0.44–1.00)
GFR, Estimated: >60 mL/min
GFR, Estimated: >60 mL/min
GFR, Estimated: >60 mL/min
Glucose, Bld: 329 mg/dL — ABNORMAL HIGH (ref 70–99)
Glucose, Bld: 212 mg/dL — ABNORMAL HIGH (ref 70–99)
Glucose, Bld: 205 mg/dL — ABNORMAL HIGH (ref 70–99)
Potassium: 4.8 mmol/L (ref 3.5–5.1)
Potassium: 3.4 mmol/L — ABNORMAL LOW (ref 3.5–5.1)
Potassium: 3.8 mmol/L (ref 3.5–5.1)
Sodium: 147 mmol/L — ABNORMAL HIGH (ref 135–145)
Sodium: 149 mmol/L — ABNORMAL HIGH (ref 135–145)
Sodium: 149 mmol/L — ABNORMAL HIGH (ref 135–145)

## 2024-01-31 LAB — CBC
HCT: 29.3 % — ABNORMAL LOW (ref 36.0–46.0)
Hemoglobin: 8.9 g/dL — ABNORMAL LOW (ref 12.0–15.0)
MCH: 25.1 pg — ABNORMAL LOW (ref 26.0–34.0)
MCHC: 30.4 g/dL (ref 30.0–36.0)
MCV: 82.5 fL (ref 80.0–100.0)
Platelets: 491 10*3/uL — ABNORMAL HIGH (ref 150–400)
RBC: 3.55 MIL/uL — ABNORMAL LOW (ref 3.87–5.11)
RDW: 16.5 % — ABNORMAL HIGH (ref 11.5–15.5)
WBC: 17.6 10*3/uL — ABNORMAL HIGH (ref 4.0–10.5)
nRBC: 0 % (ref 0.0–0.2)

## 2024-01-31 LAB — GLUCOSE, CAPILLARY
Glucose-Capillary: 159 mg/dL — ABNORMAL HIGH (ref 70–99)
Glucose-Capillary: 163 mg/dL — ABNORMAL HIGH (ref 70–99)
Glucose-Capillary: 170 mg/dL — ABNORMAL HIGH (ref 70–99)
Glucose-Capillary: 172 mg/dL — ABNORMAL HIGH (ref 70–99)
Glucose-Capillary: 173 mg/dL — ABNORMAL HIGH (ref 70–99)
Glucose-Capillary: 180 mg/dL — ABNORMAL HIGH (ref 70–99)
Glucose-Capillary: 184 mg/dL — ABNORMAL HIGH (ref 70–99)
Glucose-Capillary: 194 mg/dL — ABNORMAL HIGH (ref 70–99)
Glucose-Capillary: 208 mg/dL — ABNORMAL HIGH (ref 70–99)
Glucose-Capillary: 209 mg/dL — ABNORMAL HIGH (ref 70–99)
Glucose-Capillary: 230 mg/dL — ABNORMAL HIGH (ref 70–99)
Glucose-Capillary: 243 mg/dL — ABNORMAL HIGH (ref 70–99)
Glucose-Capillary: 252 mg/dL — ABNORMAL HIGH (ref 70–99)
Glucose-Capillary: 329 mg/dL — ABNORMAL HIGH (ref 70–99)
Glucose-Capillary: 83 mg/dL (ref 70–99)
Glucose-Capillary: 85 mg/dL (ref 70–99)

## 2024-01-31 LAB — BETA-HYDROXYBUTYRIC ACID
Beta-Hydroxybutyric Acid: 5.7 mmol/L — ABNORMAL HIGH (ref 0.05–0.27)
Beta-Hydroxybutyric Acid: 1.54 mmol/L — ABNORMAL HIGH (ref 0.05–0.27)
Beta-Hydroxybutyric Acid: 0.93 mmol/L — ABNORMAL HIGH (ref 0.05–0.27)

## 2024-01-31 MED ORDER — IPRATROPIUM-ALBUTEROL 0.5-2.5 (3) MG/3ML IN SOLN: 3.0000 mL | Freq: Four times a day (QID) | RESPIRATORY_TRACT | Status: DC | PRN

## 2024-01-31 MED ORDER — POTASSIUM CHLORIDE CRYS ER 20 MEQ PO TBCR
40.0000 meq | EXTENDED_RELEASE_TABLET | Freq: Once | ORAL | Status: AC
  Administered 2024-01-31: 40 meq via ORAL
  Filled 2024-01-31: qty 2

## 2024-01-31 MED ORDER — LACTATED RINGERS IV BOLUS
1000.0000 mL | Freq: Once | INTRAVENOUS | Status: AC
  Administered 2024-01-31: 1000 mL via INTRAVENOUS

## 2024-01-31 MED ORDER — PANTOPRAZOLE SODIUM 40 MG IV SOLR: 40.0000 mg | Freq: Two times a day (BID) | INTRAVENOUS | Status: DC

## 2024-01-31 MED ORDER — PANTOPRAZOLE SODIUM 40 MG PO TBEC
40.0000 mg | DELAYED_RELEASE_TABLET | Freq: Two times a day (BID) | ORAL | Status: DC
  Administered 2024-01-31 – 2024-02-01 (×3): 40 mg via ORAL
  Filled 2024-01-31 (×3): qty 1

## 2024-01-31 MED ORDER — KETOROLAC TROMETHAMINE 30 MG/ML IJ SOLN
30.0000 mg | Freq: Four times a day (QID) | INTRAMUSCULAR | Status: DC | PRN
  Administered 2024-01-31 (×2): 30 mg via INTRAVENOUS
  Filled 2024-01-31 (×2): qty 1

## 2024-01-31 MED ORDER — INSULIN ASPART 100 UNIT/ML IJ SOLN
0.0000 [IU] | Freq: Three times a day (TID) | INTRAMUSCULAR | Status: DC
  Administered 2024-01-31: 2 [IU] via SUBCUTANEOUS
  Filled 2024-01-31: qty 2

## 2024-01-31 MED ORDER — LACTATED RINGERS IV SOLN: INTRAVENOUS | Status: DC

## 2024-01-31 MED ORDER — INSULIN ASPART 100 UNIT/ML IJ SOLN: 0.0000 [IU] | Freq: Every day | INTRAMUSCULAR | Status: DC

## 2024-01-31 MED ORDER — INSULIN GLARGINE-YFGN 100 UNIT/ML ~~LOC~~ SOLN
20.0000 [IU] | SUBCUTANEOUS | Status: DC
  Administered 2024-01-31: 20 [IU] via SUBCUTANEOUS
  Filled 2024-01-31 (×2): qty 0.2

## 2024-01-31 MED ORDER — INSULIN ASPART 100 UNIT/ML IJ SOLN
3.0000 [IU] | Freq: Three times a day (TID) | INTRAMUSCULAR | Status: DC
  Administered 2024-01-31: 3 [IU] via SUBCUTANEOUS
  Filled 2024-01-31: qty 3

## 2024-01-31 NOTE — Assessment & Plan Note (Addendum)
 Uncontrolled baseline T1DM despite insulin  pump, last A1c 13.5 Frequent admissions for the same Presented with 1 day of hyperglycemia, abdominal pain, nausea and vomiting after running out of her insulin  Found to have severe hyperglycemia (BS>800), AGMA with bicarb 8, AG 32 and pH of 7.14 and ketonuria with BHB > 8 S/p LR bolus and initiation of insulin  drip in ED Transitioned off Endotool this AM without difficulty to 20 units glargine plus SSI and mealtime insulin  Will continue to monitor for 1 additional day Diabetic coordinator consulted to assist with insulin  pump management - will resume pump 24 hours after last dose of glargine (she does not have pump here) She will need insulin  for the pump for discharge

## 2024-01-31 NOTE — Progress Notes (Signed)
 " Progress Note   Patient: Dawn [name] FMW:[id number] DOB: 2000-01-23 DOA: 01/30/2024     0 DOS: the patient was seen and examined on 01/31/2024   Brief hospital course: 23yo with h/o uncontrolled T1DM (A1c 13.9) on insulin  pump, asthma, ADHD, and depression who presented on 1/27 with n/v after running out of insulin  in her pump.  pH 7.14, HCO3 8, BHB >8, c/w DKA.  CT with concern for esophagitis.  Started on insulin  drip via Endotool.      Assessment & Plan DKA (diabetic ketoacidosis) (HCC) Uncontrolled baseline T1DM despite insulin  pump, last A1c 13.5 Frequent admissions for the same Presented with 1 day of hyperglycemia, abdominal pain, nausea and vomiting after running out of her insulin  Found to have severe hyperglycemia (BS>800), AGMA with bicarb 8, AG 32 and pH of 7.14 and ketonuria with BHB > 8 S/p LR bolus and initiation of insulin  drip in ED Transitioned off Endotool this AM without difficulty to 20 units glargine plus SSI and mealtime insulin  Will continue to monitor for 1 additional day Diabetic coordinator consulted to assist with insulin  pump management - will resume pump 24 hours after last dose of glargine (she does not have pump here) She will need insulin  for the pump for discharge Esophagitis Patient presented with acute onset of abdominal pain, nausea and vomiting after running out of her insulin  CT A/P shows moderate circumferential wall thickening of the distal esophagus concerning for infectious or inflammatory esophagitis On daily Protonix  at home, increased to BID Recommend outpatient GI follow-up for EGD Stop Dilaudid ; pain control with IV Toradol  if needed Hypertensive urgency Labile BP from 92/33 to 210/136 since presentation Currently 156/109 Not on home medications Consider adding lisinopril IV hydralazine  as needed for SBP > 180 Chronic asthma without complication Chronic and stable As needed DuoNebs ordered      Consultants: DM  coordinator  Procedures: None  Antibiotics: None      Subjective: Feeling ok today.  Some abdominal pain.  N/V improved.  Wants to try a diet.   Objective: Vitals:   01/31/24 1100 01/31/24 1200  BP: (!) 151/84 (!) 156/109  Pulse: (!) 110 (!) 120  Resp:  12  Temp:  98.3 F (36.8 C)  SpO2: 100% 100%    Intake/Output Summary (Last 24 hours) at 01/31/2024 1347 Last data filed at 01/31/2024 1200 Gross per 24 hour  Intake 6173.25 ml  Output --  Net 6173.25 ml   Filed Weights   01/30/24 1326 01/30/24 1900  Weight: 55 kg 50.2 kg    Exam:  General:  Appears calm and comfortable and is in NAD Eyes:  normal lids, iris ENT:  grossly normal hearing, lips & tongue, mmm Cardiovascular:  RRR. No LE edema.  Respiratory:   CTA bilaterally with no wheezes/rales/rhonchi.  Normal respiratory effort. Abdomen:  soft, NT, ND Skin:  no rash or induration seen on limited exam Musculoskeletal:  grossly normal tone BUE/BLE, good ROM, no bony abnormality Psychiatric:  blunted mood and affect, speech fluent and appropriate, AOx3 Neurologic:  CN 2-12 grossly intact, moves all extremities in coordinated fashion  Data Reviewed: I have reviewed the patient's lab results since admission.  Pertinent labs for today include:   VBG: 7.38/42/38 Na++ 149 K+ 3.4 CO2 22 Glucose 212 Anion gap 17 WBC 17.6 Hgb 8.9 Platelets 491 BHB 1.54     Family Communication: None present      Code Status: Full Code    Disposition: Status is: Inpatient Admit - It is  my clinical opinion that admission to INPATIENT is reasonable and necessary because of the expectation that this patient will require hospital care that crosses at least 2 midnights to treat this condition based on the medical complexity of the problems presented.  Given the aforementioned information, the predictability of an adverse outcome is felt to be significant.      Time spent: 50 minutes  Unresulted Labs (From admission,  onward)     Start     Ordered   02/01/24 0500  Basic metabolic panel  Daily,   R     Question:  Specimen collection method  Answer:  Lab=Lab collect   01/31/24 1043             Author: Delon Herald, MD 01/31/2024 1:47 PM  For on call review [email or web address].            "

## 2024-01-31 NOTE — Assessment & Plan Note (Addendum)
 Labile BP from 92/33 to 210/136 since presentation Currently 156/109 Not on home medications Consider adding lisinopril IV hydralazine  as needed for SBP > 180

## 2024-01-31 NOTE — Hospital Course (Signed)
 23yo with h/o uncontrolled T1DM (A1c 13.9) on insulin  pump, asthma, ADHD, and depression who presented on 1/27 with n/v after running out of insulin  in her pump.  pH 7.14, HCO3 8, BHB >8, c/w DKA.  CT with concern for esophagitis.  Started on insulin  drip via Endotool.

## 2024-01-31 NOTE — Inpatient Diabetes Management (Signed)
 Inpatient Diabetes Program Recommendations  AACE/ADA: New Consensus Statement on Inpatient Glycemic Control (2015)  Target Ranges:  Prepandial:   less than 140 mg/dL      Peak postprandial:   less than 180 mg/dL (1-2 hours)      Critically ill patients:  140 - 180 mg/dL   Lab Results  Component Value Date   GLUCAP 172 (H) 01/31/2024   HGBA1C 11.7 (H) 07/19/2023    Review of Glycemic Control  Latest Reference Range & Units 01/30/24 14:47  CO2 22 - 32 mmol/L 8 (L)  Glucose 70 - 99 mg/dL 159 (HH)  Anion gap 5 - 15  32 (H)  Beta-Hydroxybutyric Acid 0.05 - 0.27 mmol/L >8.00 (H)  (HH): Data is critically high (L): Data is abnormally low (H): Data is abnormally high  Diabetes history: DM1  Outpatient Diabetes medications:  Omnipod insulin  pump with the Dexcom G7 Basal-1 unit/hr (total 24 units) ICR-1:25 or 4 units for meals  Correct every 50 > 150 [name]  20 units for back up pump failure  Current orders for Inpatient glycemic control:  Transitioning to SQ insulin -Semglee  20 units every day, Novolog  0-9 units TID and 0-5 units at bedtime, Novolog  3 units TID  Patient is well known to the DM coordinator team as she has had multiple admissions for DKA and hypoglycemia over the last few years.    Met with her at bedside.  She is in [place] visiting; lives in [place].  She states she brought enough insulin  for 2 weeks but ran out last night.  Pharmacy told her she could not get more insulin  until Thursday.  She states she gave herself [name]  20 units last night.  Comes to the ED with hyperglycemia and need for insulin .  She wears the Omnipod insulin  pump and uses the Dexcom G7.  She is followed by [facility] in [facility].  Last telehealth visit was on 10/20/23.  Has had multiple admissions at [facility] over the last several months for DKA/hypoglycemia.    Will need insulin  for her Omnipod at DC.  She is currently wearing her Dexcom G7.    Reviewed patient's current  A1c of 13.9%. Explained what a A1c is and what it measures. Also reviewed goal A1c with patient, importance of good glucose control @ home, and blood sugar goals.  Reviewed patient's current A1c of 10%. Explained what a A1c is and what it measures. Also reviewed goal A1c with patient, importance of good glucose control @ home, and blood sugar goals.   Can replace her pump once it's been 24 hrs since basal insulin  administration.    Thank you, Wyvonna Pinal, MSN, CDCES Diabetes Coordinator Inpatient Diabetes Program [phone] (team pager from 8a-5p)

## 2024-01-31 NOTE — Assessment & Plan Note (Addendum)
 Chronic and stable As needed DuoNebs ordered

## 2024-01-31 NOTE — Assessment & Plan Note (Addendum)
 Patient presented with acute onset of abdominal pain, nausea and vomiting after running out of her insulin  CT A/P shows moderate circumferential wall thickening of the distal esophagus concerning for infectious or inflammatory esophagitis On daily Protonix  at home, increased to BID Recommend outpatient GI follow-up for EGD Stop Dilaudid ; pain control with IV Toradol  if needed

## 2024-01-31 NOTE — Progress Notes (Signed)
 Responded to consult for IV. Both arms assessed with US . No appropriate site for peripheral access due to size and depth of veins. Cephalic vein undetectable. Brachial and basilic veins too deep to consider midline placement. Recommend considering CVC/PICC.

## 2024-02-01 ENCOUNTER — Other Ambulatory Visit (HOSPITAL_COMMUNITY): Payer: Self-pay

## 2024-02-01 ENCOUNTER — Other Ambulatory Visit (HOSPITAL_BASED_OUTPATIENT_CLINIC_OR_DEPARTMENT_OTHER): Payer: Self-pay

## 2024-02-01 DIAGNOSIS — E1010 Type 1 diabetes mellitus with ketoacidosis without coma: Secondary | ICD-10-CM | POA: Diagnosis not present

## 2024-02-01 LAB — BASIC METABOLIC PANEL WITH GFR
Anion gap: 11 (ref 5–15)
BUN: 16 mg/dL (ref 6–20)
CO2: 23 mmol/L (ref 22–32)
Calcium: 8.8 mg/dL — ABNORMAL LOW (ref 8.9–10.3)
Chloride: 108 mmol/L (ref 98–111)
Creatinine, Ser: 0.93 mg/dL (ref 0.44–1.00)
GFR, Estimated: >60 mL/min
Glucose, Bld: 113 mg/dL — ABNORMAL HIGH (ref 70–99)
Potassium: 3.4 mmol/L — ABNORMAL LOW (ref 3.5–5.1)
Sodium: 142 mmol/L (ref 135–145)

## 2024-02-01 LAB — GLUCOSE, CAPILLARY: Glucose-Capillary: 95 mg/dL (ref 70–99)

## 2024-02-01 MED ORDER — POTASSIUM CHLORIDE CRYS ER 20 MEQ PO TBCR
40.0000 meq | EXTENDED_RELEASE_TABLET | Freq: Once | ORAL | Status: AC
  Administered 2024-02-01: 40 meq via ORAL
  Filled 2024-02-01: qty 2

## 2024-02-01 MED ORDER — INSULIN LISPRO 100 UNIT/ML IJ SOLN
20.0000 [IU] | INTRAMUSCULAR | 11 refills | Status: AC
  Filled 2024-02-01: qty 10, 15d supply, fill #0

## 2024-02-01 MED ORDER — ACETAMINOPHEN 325 MG PO TABS: 650.0000 mg | ORAL_TABLET | Freq: Four times a day (QID) | ORAL | Status: AC | PRN

## 2024-02-01 MED ORDER — PANTOPRAZOLE SODIUM 40 MG PO TBEC
40.0000 mg | DELAYED_RELEASE_TABLET | Freq: Two times a day (BID) | ORAL | 0 refills | Status: AC
  Filled 2024-02-01: qty 56, 28d supply, fill #0

## 2024-02-01 NOTE — Assessment & Plan Note (Addendum)
 Labile BP from 92/33 to 210/136 since presentation Currently 119/69 Not on home medications Consider adding lisinopril as an outpatient

## 2024-02-01 NOTE — Assessment & Plan Note (Addendum)
 Chronic and stable.

## 2024-02-01 NOTE — Discharge Summary (Signed)
 " Physician Discharge Summary   Patient: Dawn [name] MRN: [id number] DOB: [birth year]  Admit date:     01/30/2024  Discharge date: 02/01/24  Discharge Physician: Delon Herald   PCP: Pcp, No   Recommendations at discharge:   Resume insulin  pump at time of discharge; insulin  is provided Increase Protonix  to twice daily for the next 4 weeks and then resume once daily dosing Follow up with GI outpatient  Follow up with primary care  Discharge Diagnoses: Principal Problem:   DKA (diabetic ketoacidosis) (HCC) Active Problems:   Nausea and vomiting   Esophagitis   Generalized abdominal pain   Hypertensive urgency   Chronic asthma without complication   Hospital Course: 23yo with h/o uncontrolled T1DM (A1c 13.9) on insulin  pump, asthma, ADHD, and depression who presented on 1/27 with n/v after running out of insulin  in her pump.  pH 7.14, HCO3 8, BHB >8, c/w DKA.  CT with concern for esophagitis.  Started on insulin  drip via Endotool.    Assessment and Plan:  Assessment & Plan DKA (diabetic ketoacidosis) (HCC) Uncontrolled baseline T1DM despite insulin  pump, last A1c 13.5 Frequent admissions for the same Presented with 1 day of hyperglycemia, abdominal pain, nausea and vomiting after running out of her insulin  Found to have severe hyperglycemia (BS>800), AGMA with bicarb 8, AG 32 and pH of 7.14 and ketonuria with BHB > 8 S/p LR bolus and initiation of insulin  drip in ED Transitioned off Endotool this AM without difficulty to 20 units glargine plus SSI and mealtime insulin  Monitored for 1 additional day, doing well Diabetes coordinator consulted to assist with insulin  pump management - will resume pump 24 hours after last dose of glargine (she does not have pump here) She will need insulin  for the pump for discharge Esophagitis Patient presented with acute onset of abdominal pain, nausea and vomiting after running out of her insulin  CT A/P shows moderate circumferential wall  thickening of the distal esophagus concerning for infectious or inflammatory esophagitis On daily Protonix  at home, increased to BID Recommend outpatient GI follow-up for EGD Stop Dilaudid ; pain control with IV Toradol  if needed Hypertensive urgency Labile BP from 92/33 to 210/136 since presentation Currently 119/69 Not on home medications Consider adding lisinopril as an outpatient Chronic asthma without complication Chronic and stable       Consultants: DM coordinator   Procedures: None   Antibiotics: None   Pain control -   Controlled Substance Reporting System database was reviewed. and patient was instructed, not to drive, operate heavy machinery, perform activities at heights, swimming or participation in water  activities or provide baby-sitting services while on Pain, Sleep and Anxiety Medications; until their outpatient Physician has advised to do so again. Also recommended to not to take more than prescribed Pain, Sleep and Anxiety Medications.   Disposition: Home Diet recommendation:  Carb modified diet DISCHARGE MEDICATION: Allergies as of 02/01/2024       Reactions   Latex Itching, Other (See Comments), Swelling, Dermatitis   Skin irritation Redness, irritation, localized swelling in genital region with use of latex-containing condoms.   Shellfish Allergy Itching, Rash, Hives   Scallops specifically   Tape Other (See Comments)   Skin irritation        Medication List     PAUSE taking these medications    pantoprazole  40 MG tablet Wait to take this until: February 29, 2024 Commonly known as: PROTONIX  Take 40 mg by mouth daily. You also have another medication with the same name that you  may need to continue taking.       STOP taking these medications    Baqsimi  Two Pack 3 MG/DOSE Powd Generic drug: Glucagon    insulin  aspart 100 UNIT/ML injection Commonly known as: novoLOG    insulin  degludec 100 UNIT/ML FlexTouch Pen Commonly  known as: [name]    montelukast  10 MG tablet Commonly known as: Singulair        TAKE these medications    acetaminophen  325 MG tablet Commonly known as: TYLENOL  Take 2 tablets (650 mg total) by mouth every 6 (six) hours as needed for mild pain (pain score 1-3) or fever (or Fever >/= 101).   cetirizine  10 MG tablet Commonly known as: ZyrTEC  Allergy Take 1 tablet (10 mg total) by mouth at bedtime. What changed: when to take this   Dexcom G7 Sensor Misc Inject 1 Application into the skin as directed.   gabapentin  100 MG capsule Commonly known as: NEURONTIN  Take 100 mg by mouth 3 (three) times daily as needed (nerve pain).   insulin  lispro 100 UNIT/ML injection Commonly known as: HUMALOG  Inject 0.2 mLs (20 Units total) into the skin See admin instructions. 200 units into the pump every 3 days What changed: how much to take   levETIRAcetam 250 MG tablet Commonly known as: KEPPRA Take 250 mg by mouth daily.   ondansetron  4 MG disintegrating tablet Commonly known as: ZOFRAN -ODT Take 4 mg by mouth daily as needed for nausea or vomiting.   pantoprazole  40 MG tablet Commonly known as: PROTONIX  Take 1 tablet (40 mg total) by mouth 2 (two) times daily. What changed: Another medication with the same name was paused. Ask your nurse or doctor if you should take this medication.   valACYclovir 1000 MG tablet Commonly known as: VALTREX Take 1,000 mg by mouth 2 (two) times daily.        Discharge Exam:    Subjective: Feeling better, tolerating diet, abdominal pain is improved, wants to go home today.   Objective: Vitals:   02/01/24 0730 02/01/24 0800  BP:    Pulse:  (!) 109  Resp:  20  Temp: 98.2 F (36.8 C)   SpO2:  99%    Intake/Output Summary (Last 24 hours) at 02/01/2024 0807 Last data filed at 02/01/2024 0000 Gross per 24 hour  Intake 2616.42 ml  Output --  Net 2616.42 ml   Filed Weights   01/30/24 1326 01/30/24 1900  Weight: 55 kg 50.2 kg     Exam:  General:  Appears calm and comfortable and is in NAD Eyes:  normal lids, iris ENT:  grossly normal hearing, lips & tongue, mmm Cardiovascular:  RRR, no m/r/g. No LE edema.  Respiratory:   CTA bilaterally with no wheezes/rales/rhonchi.  Normal respiratory effort. Abdomen:  soft, NT, ND Skin:  no rash or induration seen on limited exam Musculoskeletal:  grossly normal tone BUE/BLE, good ROM, no bony abnormality Psychiatric:  blunted mood and affect, speech fluent and appropriate, AOx3 Neurologic:  CN 2-12 grossly intact, moves all extremities in coordinated fashion  Data Reviewed: I have reviewed the patient's lab results since admission.  Pertinent labs for today include:   K+ 3.4, repleted    Condition at discharge: stable  The results of significant diagnostics from this hospitalization (including imaging, microbiology, ancillary and laboratory) are listed below for reference.   Imaging Studies: CT ABDOMEN PELVIS W CONTRAST Result Date: 01/30/2024 EXAM: CT ABDOMEN AND PELVIS WITH CONTRAST 01/30/2024 05:23:42 PM TECHNIQUE: CT of the abdomen and pelvis was performed with the  administration of intravenous contrast. Multiplanar reformatted images are provided for review. Automated exposure control, iterative reconstruction, and/or weight-based adjustment of the mA/kV was utilized to reduce the radiation dose to as low as reasonably achievable. COMPARISON: 05/15/2022. CLINICAL HISTORY: Generalized abdominal pain, elevated lipase and alkaline phosphatase, nausea/vomiting, diabetic ketoacidosis. FINDINGS: LOWER CHEST: Moderate circumferential wall thickening of the distal esophagus. LIVER: The liver is unremarkable. GALLBLADDER AND BILE DUCTS: Gallbladder is unremarkable. No biliary ductal dilatation. SPLEEN: No acute abnormality. PANCREAS: No acute abnormality. ADRENAL GLANDS: No acute abnormality. KIDNEYS, URETERS AND BLADDER: No stones in the kidneys or ureters. No hydronephrosis.  No perinephric or periureteral stranding. The urinary bladder is distended without focal abnormality. GI AND BOWEL: Stomach demonstrates no acute abnormality. There is no bowel obstruction. PERITONEUM AND RETROPERITONEUM: No ascites. No free air. No free pelvic fluid. VASCULATURE: Aorta is normal in caliber. LYMPH NODES: No lymphadenopathy. REPRODUCTIVE ORGANS: Age appropriate appearance of the uterus and ovaries. BONES AND SOFT TISSUES: No acute osseous abnormality. Umbilical adornment. IMPRESSION: 1. Moderate circumferential wall thickening of the distal esophagus, worrisome for an infectious or inflammatory esophagitis. Nonemergent upper endoscopy may be of benefit for further characterization. Electronically signed by: Rogelia Myers MD 01/30/2024 06:01 PM EST RP Workstation: [id number]   DG Chest 2 View Result Date: 01/30/2024 EXAM: 2 VIEW(S) XRAY OF THE CHEST 01/30/2024 02:49:00 PM COMPARISON: 08/02/2023 CLINICAL HISTORY: Shortness of breath, chest pain. FINDINGS: LUNGS AND PLEURA: No focal pulmonary opacity. No pleural effusion. No pneumothorax. HEART AND MEDIASTINUM: No acute abnormality of the cardiac and mediastinal silhouettes. BONES AND SOFT TISSUES: No acute osseous abnormality. IMPRESSION: 1. No acute cardiopulmonary pathology. Electronically signed by: Norleen Boxer MD 01/30/2024 03:25 PM EST RP Workstation: [id number]    Microbiology: Results for orders placed or performed during the hospital encounter of 01/30/24  MRSA Next Gen by PCR, Nasal     Status: None   Collection Time: 01/30/24  8:42 PM   Specimen: Nasal Mucosa; Nasal Swab  Result Value Ref Range Status   MRSA by PCR Next Gen NOT DETECTED NOT DETECTED Final    Comment: (NOTE) The GeneXpert MRSA Assay (FDA approved for NASAL specimens only), is one component of a comprehensive MRSA colonization surveillance program. It is not intended to diagnose MRSA infection nor to guide or monitor treatment for MRSA infections. Test  performance is not FDA approved in patients less than 37 years old. Performed at [facility], [address] [place], [place] [address]    *Note: Due to a large number of results and/or encounters for the requested time period, some results have not been displayed. A complete set of results can be found in Results Review.    Labs: CBC: Recent Labs  Lab 01/30/24 1425 01/30/24 1447 01/31/24 0339  WBC  --  10.2 17.6*  NEUTROABS  --  8.3*  --   HGB 11.6* 9.6* 8.9*  HCT 34.0* 32.8* 29.3*  MCV  --  85.6 82.5  PLT  --  464* 491*   Basic Metabolic Panel: Recent Labs  Lab 01/30/24 1444 01/30/24 1447 01/30/24 2247 01/31/24 0339 01/31/24 0832 02/01/24 0317  NA  --  133* 147* 149* 149* 142  K  --  5.2* 4.8 3.4* 3.8 3.4*  CL  --  93* 104 111 111 108  CO2  --  8* 15* 22 24 23   GLUCOSE  --  840* 329* 212* 205* 113*  BUN  --  38* 33* 29* 27* 16  CREATININE  --  1.15*  1.09* 1.00 0.97 0.93  CALCIUM   --  10.6* 10.8* 9.9 9.4 8.8*  MG  --  2.8*  --   --   --   --   PHOS 7.1*  --   --   --   --   --    Liver Function Tests: Recent Labs  Lab 01/30/24 1447  AST 27  ALT 22  ALKPHOS 195*  BILITOT 0.3  PROT 9.4*  ALBUMIN 4.6   CBG: Recent Labs  Lab 01/31/24 1201 01/31/24 1257 01/31/24 1717 01/31/24 2121 02/01/24 0751  GLUCAP 172* 163* 83 85 95    Discharge time spent: greater than 30 minutes.  Signed: Delon Herald, MD [facility] 02/01/2024 "

## 2024-02-01 NOTE — Assessment & Plan Note (Addendum)
 Patient presented with acute onset of abdominal pain, nausea and vomiting after running out of her insulin  CT A/P shows moderate circumferential wall thickening of the distal esophagus concerning for infectious or inflammatory esophagitis On daily Protonix  at home, increased to BID Recommend outpatient GI follow-up for EGD Stop Dilaudid ; pain control with IV Toradol  if needed

## 2024-02-01 NOTE — Assessment & Plan Note (Addendum)
 Uncontrolled baseline T1DM despite insulin  pump, last A1c 13.5 Frequent admissions for the same Presented with 1 day of hyperglycemia, abdominal pain, nausea and vomiting after running out of her insulin  Found to have severe hyperglycemia (BS>800), AGMA with bicarb 8, AG 32 and pH of 7.14 and ketonuria with BHB > 8 S/p LR bolus and initiation of insulin  drip in ED Transitioned off Endotool this AM without difficulty to 20 units glargine plus SSI and mealtime insulin  Monitored for 1 additional day, doing well Diabetes coordinator consulted to assist with insulin  pump management - will resume pump 24 hours after last dose of glargine (she does not have pump here) She will need insulin  for the pump for discharge

## 2024-02-01 NOTE — Plan of Care (Signed)
 Patient belongings returned to patient. This RN educated patient on discharge instructions. Patient's PIV removed. Patient brought in wheelchair to discharge lounge. Medications from [facility] pharmacy will be brought to patient by discharge RN in discharge lounge.   Problem: Education: Goal: Knowledge of General Education information will improve Description: Including pain rating scale, medication(s)/side effects and non-pharmacologic comfort measures Outcome: Completed/Met   Problem: Health Behavior/Discharge Planning: Goal: Ability to manage health-related needs will improve Outcome: Completed/Met   Problem: Clinical Measurements: Goal: Ability to maintain clinical measurements within normal limits will improve Outcome: Completed/Met Goal: Will remain free from infection Outcome: Completed/Met Goal: Diagnostic test results will improve Outcome: Completed/Met Goal: Respiratory complications will improve Outcome: Completed/Met Goal: Cardiovascular complication will be avoided Outcome: Completed/Met   Problem: Activity: Goal: Risk for activity intolerance will decrease Outcome: Completed/Met   Problem: Nutrition: Goal: Adequate nutrition will be maintained Outcome: Completed/Met   Problem: Coping: Goal: Level of anxiety will decrease Outcome: Completed/Met   Problem: Elimination: Goal: Will not experience complications related to bowel motility Outcome: Completed/Met Goal: Will not experience complications related to urinary retention Outcome: Completed/Met   Problem: Pain Managment: Goal: General experience of comfort will improve and/or be controlled Outcome: Completed/Met   Problem: Safety: Goal: Ability to remain free from injury will improve Outcome: Completed/Met   Problem: Skin Integrity: Goal: Risk for impaired skin integrity will decrease Outcome: Completed/Met

## 2024-02-01 NOTE — Plan of Care (Signed)
   Problem: Education: Goal: Knowledge of General Education information will improve Description: Including pain rating scale, medication(s)/side effects and non-pharmacologic comfort measures Outcome: Progressing   Problem: Activity: Goal: Risk for activity intolerance will decrease Outcome: Progressing   Problem: Nutrition: Goal: Adequate nutrition will be maintained Outcome: Progressing   Problem: Coping: Goal: Level of anxiety will decrease Outcome: Progressing   Problem: Pain Managment: Goal: General experience of comfort will improve and/or be controlled Outcome: Progressing   Problem: Safety: Goal: Ability to remain free from injury will improve Outcome: Progressing

## 2024-02-04 LAB — BLOOD GAS, VENOUS
Acid-base deficit: 0.6 mmol/L (ref 0.0–2.0)
Bicarbonate: 24.9 mmol/L (ref 20.0–28.0)
Drawn by: 69686
O2 Saturation: 67.9 %
Patient temperature: 36.6
pCO2, Ven: 42 mmHg — ABNORMAL LOW (ref 44–60)
pH, Ven: 7.38 (ref 7.25–7.43)
pO2, Ven: 38 mmHg (ref 32–45)
